# Patient Record
Sex: Male | Born: 1940
Health system: Southern US, Community
[De-identification: ages and names within clinical notes are randomized; demographics above are authoritative.]

## PROBLEM LIST (undated history)

## (undated) DIAGNOSIS — R569 Unspecified convulsions: Secondary | ICD-10-CM

## (undated) DIAGNOSIS — F1010 Alcohol abuse, uncomplicated: Secondary | ICD-10-CM

## (undated) DIAGNOSIS — E785 Hyperlipidemia, unspecified: Secondary | ICD-10-CM

## (undated) DIAGNOSIS — F0390 Unspecified dementia without behavioral disturbance: Secondary | ICD-10-CM

## (undated) DIAGNOSIS — K219 Gastro-esophageal reflux disease without esophagitis: Secondary | ICD-10-CM

## (undated) DIAGNOSIS — I639 Cerebral infarction, unspecified: Secondary | ICD-10-CM

## (undated) DIAGNOSIS — I10 Essential (primary) hypertension: Secondary | ICD-10-CM

## (undated) HISTORY — PX: OTHER SURGICAL HISTORY: SHX169

---

## 2005-05-19 ENCOUNTER — Inpatient Hospital Stay: Payer: Self-pay | Admitting: Psychiatry

## 2005-05-24 ENCOUNTER — Ambulatory Visit: Payer: Self-pay | Admitting: Unknown Physician Specialty

## 2005-05-27 ENCOUNTER — Ambulatory Visit: Payer: Self-pay | Admitting: Unknown Physician Specialty

## 2005-06-25 ENCOUNTER — Ambulatory Visit: Payer: Self-pay | Admitting: Unknown Physician Specialty

## 2006-12-12 ENCOUNTER — Ambulatory Visit: Payer: Self-pay | Admitting: Family Medicine

## 2006-12-14 ENCOUNTER — Ambulatory Visit: Payer: Self-pay

## 2007-05-21 ENCOUNTER — Emergency Department: Payer: Self-pay | Admitting: Emergency Medicine

## 2007-05-21 ENCOUNTER — Other Ambulatory Visit: Payer: Self-pay

## 2007-12-07 ENCOUNTER — Other Ambulatory Visit: Payer: Self-pay

## 2007-12-07 ENCOUNTER — Inpatient Hospital Stay: Payer: Self-pay | Admitting: Internal Medicine

## 2009-07-11 ENCOUNTER — Emergency Department: Payer: Self-pay

## 2009-07-13 ENCOUNTER — Inpatient Hospital Stay: Payer: Self-pay | Admitting: Internal Medicine

## 2013-09-28 ENCOUNTER — Emergency Department: Payer: Self-pay | Admitting: Emergency Medicine

## 2013-09-28 LAB — COMPREHENSIVE METABOLIC PANEL
Albumin: 3.5 g/dL (ref 3.4–5.0)
BUN: 16 mg/dL (ref 7–18)
Bilirubin,Total: 0.6 mg/dL (ref 0.2–1.0)
Calcium, Total: 9 mg/dL (ref 8.5–10.1)
Chloride: 101 mmol/L (ref 98–107)
Co2: 30 mmol/L (ref 21–32)
EGFR (African American): >60
EGFR (Non-African Amer.): >60
Glucose: 104 mg/dL — ABNORMAL HIGH (ref 65–99)
Osmolality: 270 (ref 275–301)
SGOT(AST): 50 U/L — ABNORMAL HIGH (ref 15–37)
Sodium: 134 mmol/L — ABNORMAL LOW (ref 136–145)
Total Protein: 7.2 g/dL (ref 6.4–8.2)

## 2013-09-28 LAB — CBC
HCT: 36.4 % — ABNORMAL LOW (ref 40.0–52.0)
HGB: 12.9 g/dL — ABNORMAL LOW (ref 13.0–18.0)
MCH: 37.7 pg — ABNORMAL HIGH (ref 26.0–34.0)
Platelet: 182 10*3/uL (ref 150–440)
RDW: 17.5 % — ABNORMAL HIGH (ref 11.5–14.5)
WBC: 7.4 10*3/uL (ref 3.8–10.6)

## 2013-09-28 LAB — URINALYSIS, COMPLETE
Bilirubin,UR: NEGATIVE
Glucose,UR: NEGATIVE mg/dL (ref 0–75)
Protein: NEGATIVE
RBC,UR: 7 /HPF (ref 0–5)
Squamous Epithelial: 2
WBC UR: 21 /HPF (ref 0–5)

## 2013-10-09 ENCOUNTER — Ambulatory Visit: Payer: Self-pay

## 2014-06-06 ENCOUNTER — Inpatient Hospital Stay: Payer: Self-pay

## 2014-06-06 LAB — ETHANOL
Ethanol %: <0.003 % (ref 0.000–0.080)
Ethanol: <3 mg/dL

## 2014-06-06 LAB — CBC
HCT: 43 % (ref 40.0–52.0)
HGB: 14.5 g/dL (ref 13.0–18.0)
MCH: 35.6 pg — ABNORMAL HIGH (ref 26.0–34.0)
MCHC: 33.6 g/dL (ref 32.0–36.0)
MCV: 106 fL — ABNORMAL HIGH (ref 80–100)
PLATELETS: 61 10*3/uL — AB (ref 150–440)
RBC: 4.06 10*6/uL — AB (ref 4.40–5.90)
RDW: 17.4 % — AB (ref 11.5–14.5)
WBC: 12.8 10*3/uL — AB (ref 3.8–10.6)

## 2014-06-06 LAB — BASIC METABOLIC PANEL
ANION GAP: 11 (ref 7–16)
BUN: 15 mg/dL (ref 7–18)
Calcium, Total: 8.6 mg/dL (ref 8.5–10.1)
Chloride: 94 mmol/L — ABNORMAL LOW (ref 98–107)
Co2: 31 mmol/L (ref 21–32)
Creatinine: 1.17 mg/dL (ref 0.60–1.30)
EGFR (Non-African Amer.): >60
Glucose: 135 mg/dL — ABNORMAL HIGH (ref 65–99)
Osmolality: 275 (ref 275–301)
Potassium: 2.3 mmol/L — CL (ref 3.5–5.1)
Sodium: 136 mmol/L (ref 136–145)

## 2014-06-06 LAB — HEPATIC FUNCTION PANEL A (ARMC)
ALBUMIN: 3.4 g/dL (ref 3.4–5.0)
AST: 229 U/L — AB (ref 15–37)
Alkaline Phosphatase: 98 U/L
BILIRUBIN TOTAL: 1.3 mg/dL — AB (ref 0.2–1.0)
Bilirubin, Direct: 0.6 mg/dL — ABNORMAL HIGH (ref 0.00–0.20)
SGPT (ALT): 84 U/L — ABNORMAL HIGH (ref 12–78)
Total Protein: 7.3 g/dL (ref 6.4–8.2)

## 2014-06-06 LAB — MAGNESIUM: Magnesium: 0.5 mg/dL — ABNORMAL LOW

## 2014-06-07 LAB — CBC WITH DIFFERENTIAL/PLATELET
BASOS PCT: 0.3 %
Basophil #: 0 10*3/uL (ref 0.0–0.1)
EOS ABS: 0 10*3/uL (ref 0.0–0.7)
Eosinophil %: 0.2 %
HCT: 39.4 % — ABNORMAL LOW (ref 40.0–52.0)
HGB: 13.3 g/dL (ref 13.0–18.0)
LYMPHS ABS: 0.8 10*3/uL — AB (ref 1.0–3.6)
Lymphocyte %: 7.9 %
MCH: 36.3 pg — ABNORMAL HIGH (ref 26.0–34.0)
MCHC: 33.7 g/dL (ref 32.0–36.0)
MCV: 108 fL — AB (ref 80–100)
Monocyte #: 0.8 x10 3/mm (ref 0.2–1.0)
Monocyte %: 7.8 %
Neutrophil #: 8.6 10*3/uL — ABNORMAL HIGH (ref 1.4–6.5)
Neutrophil %: 83.8 %
PLATELETS: 61 10*3/uL — AB (ref 150–440)
RBC: 3.66 10*6/uL — AB (ref 4.40–5.90)
RDW: 18.5 % — AB (ref 11.5–14.5)
WBC: 10.3 10*3/uL (ref 3.8–10.6)

## 2014-06-07 LAB — BASIC METABOLIC PANEL
Anion Gap: 6 — ABNORMAL LOW (ref 7–16)
BUN: 13 mg/dL (ref 7–18)
CALCIUM: 8.3 mg/dL — AB (ref 8.5–10.1)
Chloride: 97 mmol/L — ABNORMAL LOW (ref 98–107)
Co2: 34 mmol/L — ABNORMAL HIGH (ref 21–32)
Creatinine: 1.03 mg/dL (ref 0.60–1.30)
EGFR (Non-African Amer.): >60
GLUCOSE: 99 mg/dL (ref 65–99)
Osmolality: 274 (ref 275–301)
Potassium: 3.4 mmol/L — ABNORMAL LOW (ref 3.5–5.1)
Sodium: 137 mmol/L (ref 136–145)

## 2014-06-07 LAB — DRUG SCREEN, URINE
Amphetamines, Ur Screen: NEGATIVE (ref ?–1000)
BARBITURATES, UR SCREEN: NEGATIVE (ref ?–200)
Benzodiazepine, Ur Scrn: NEGATIVE (ref ?–200)
Cannabinoid 50 Ng, Ur ~~LOC~~: NEGATIVE (ref ?–50)
Cocaine Metabolite,Ur ~~LOC~~: NEGATIVE (ref ?–300)
MDMA (ECSTASY) UR SCREEN: NEGATIVE (ref ?–500)
Methadone, Ur Screen: NEGATIVE (ref ?–300)
Opiate, Ur Screen: NEGATIVE (ref ?–300)
PHENCYCLIDINE (PCP) UR S: NEGATIVE (ref ?–25)
TRICYCLIC, UR SCREEN: NEGATIVE (ref ?–1000)

## 2014-06-07 LAB — URINALYSIS, COMPLETE
Bilirubin,UR: NEGATIVE
Glucose,UR: NEGATIVE mg/dL (ref 0–75)
NITRITE: NEGATIVE
Ph: 6 (ref 4.5–8.0)
Protein: 30
RBC,UR: 12 /HPF (ref 0–5)
SPECIFIC GRAVITY: 1.018 (ref 1.003–1.030)
Squamous Epithelial: 2
WBC UR: 33 /HPF (ref 0–5)

## 2014-06-07 LAB — MAGNESIUM: Magnesium: 2.1 mg/dL

## 2014-06-07 LAB — CLOSTRIDIUM DIFFICILE(ARMC)

## 2014-06-09 LAB — COMPREHENSIVE METABOLIC PANEL
ALT: 70 U/L (ref 12–78)
Albumin: 3.1 g/dL — ABNORMAL LOW (ref 3.4–5.0)
Alkaline Phosphatase: 89 U/L
Anion Gap: 11 (ref 7–16)
BILIRUBIN TOTAL: 1.5 mg/dL — AB (ref 0.2–1.0)
BUN: 11 mg/dL (ref 7–18)
Calcium, Total: 8.1 mg/dL — ABNORMAL LOW (ref 8.5–10.1)
Chloride: 99 mmol/L (ref 98–107)
Co2: 24 mmol/L (ref 21–32)
Creatinine: 0.83 mg/dL (ref 0.60–1.30)
EGFR (African American): >60
EGFR (Non-African Amer.): >60
Glucose: 78 mg/dL (ref 65–99)
Osmolality: 267 (ref 275–301)
Potassium: 3.9 mmol/L (ref 3.5–5.1)
SGOT(AST): 179 U/L — ABNORMAL HIGH (ref 15–37)
Sodium: 134 mmol/L — ABNORMAL LOW (ref 136–145)
Total Protein: 7 g/dL (ref 6.4–8.2)

## 2014-06-10 LAB — CBC WITH DIFFERENTIAL/PLATELET
Eosinophil: 1 %
HCT: 35.8 % — AB (ref 40.0–52.0)
HGB: 11.9 g/dL — AB (ref 13.0–18.0)
Lymphocytes: 11 %
MCH: 36.2 pg — AB (ref 26.0–34.0)
MCHC: 33.2 g/dL (ref 32.0–36.0)
MCV: 109 fL — ABNORMAL HIGH (ref 80–100)
Metamyelocyte: 1 %
Monocytes: 11 %
PLATELETS: 101 10*3/uL — AB (ref 150–440)
RBC: 3.28 10*6/uL — AB (ref 4.40–5.90)
RDW: 18.2 % — AB (ref 11.5–14.5)
SEGMENTED NEUTROPHILS: 76 %
WBC: 8 10*3/uL (ref 3.8–10.6)

## 2014-06-12 LAB — URINALYSIS, COMPLETE
Bacteria: NONE SEEN
Bilirubin,UR: NEGATIVE
Glucose,UR: NEGATIVE mg/dL (ref 0–75)
Ketone: NEGATIVE
Leukocyte Esterase: NEGATIVE
NITRITE: NEGATIVE
Ph: 8 (ref 4.5–8.0)
Protein: NEGATIVE
RBC,UR: 1 /HPF (ref 0–5)
Specific Gravity: 1.005 (ref 1.003–1.030)
Squamous Epithelial: 1
WBC UR: 1 /HPF (ref 0–5)

## 2014-06-13 LAB — URINE CULTURE

## 2014-10-25 ENCOUNTER — Emergency Department: Payer: Self-pay | Admitting: Emergency Medicine

## 2014-10-25 LAB — COMPREHENSIVE METABOLIC PANEL
ANION GAP: 11 (ref 7–16)
Albumin: 3.7 g/dL (ref 3.4–5.0)
Alkaline Phosphatase: 92 U/L
BILIRUBIN TOTAL: 1.5 mg/dL — AB (ref 0.2–1.0)
BUN: 18 mg/dL (ref 7–18)
CALCIUM: 8.9 mg/dL (ref 8.5–10.1)
Chloride: 92 mmol/L — ABNORMAL LOW (ref 98–107)
Co2: 30 mmol/L (ref 21–32)
Creatinine: 1.02 mg/dL (ref 0.60–1.30)
Glucose: 125 mg/dL — ABNORMAL HIGH (ref 65–99)
Osmolality: 270 (ref 275–301)
POTASSIUM: 3.3 mmol/L — AB (ref 3.5–5.1)
SGOT(AST): 167 U/L — ABNORMAL HIGH (ref 15–37)
SGPT (ALT): 67 U/L — ABNORMAL HIGH
SODIUM: 133 mmol/L — AB (ref 136–145)
Total Protein: 7.7 g/dL (ref 6.4–8.2)

## 2014-10-25 LAB — CBC WITH DIFFERENTIAL/PLATELET
BASOS ABS: 0 10*3/uL (ref 0.0–0.1)
Basophil %: 0.6 %
EOS ABS: 0 10*3/uL (ref 0.0–0.7)
EOS PCT: 0.9 %
HCT: 38.6 % — ABNORMAL LOW (ref 40.0–52.0)
HGB: 12.9 g/dL — AB (ref 13.0–18.0)
Lymphocyte #: 0.8 10*3/uL — ABNORMAL LOW (ref 1.0–3.6)
Lymphocyte %: 16.2 %
MCH: 36 pg — AB (ref 26.0–34.0)
MCHC: 33.3 g/dL (ref 32.0–36.0)
MCV: 108 fL — ABNORMAL HIGH (ref 80–100)
Monocyte #: 0.9 x10 3/mm (ref 0.2–1.0)
Monocyte %: 18.7 %
NEUTROS ABS: 3 10*3/uL (ref 1.4–6.5)
Neutrophil %: 63.6 %
PLATELETS: 93 10*3/uL — AB (ref 150–440)
RBC: 3.58 10*6/uL — ABNORMAL LOW (ref 4.40–5.90)
RDW: 19.6 % — ABNORMAL HIGH (ref 11.5–14.5)
WBC: 4.7 10*3/uL (ref 3.8–10.6)

## 2014-10-25 LAB — TROPONIN I
TROPONIN-I: 0.02 ng/mL
Troponin-I: 0.03 ng/mL

## 2014-10-25 LAB — LIPASE, BLOOD: LIPASE: 258 U/L (ref 73–393)

## 2014-10-25 LAB — ETHANOL: Ethanol: <3 mg/dL

## 2014-12-06 DIAGNOSIS — M545 Low back pain: Secondary | ICD-10-CM | POA: Diagnosis not present

## 2014-12-06 DIAGNOSIS — N390 Urinary tract infection, site not specified: Secondary | ICD-10-CM | POA: Diagnosis not present

## 2014-12-06 DIAGNOSIS — I10 Essential (primary) hypertension: Secondary | ICD-10-CM | POA: Diagnosis not present

## 2014-12-06 DIAGNOSIS — S21201D Unspecified open wound of right back wall of thorax without penetration into thoracic cavity, subsequent encounter: Secondary | ICD-10-CM | POA: Diagnosis not present

## 2015-01-31 DIAGNOSIS — Z0000 Encounter for general adult medical examination without abnormal findings: Secondary | ICD-10-CM | POA: Diagnosis not present

## 2015-01-31 DIAGNOSIS — Z862 Personal history of diseases of the blood and blood-forming organs and certain disorders involving the immune mechanism: Secondary | ICD-10-CM | POA: Diagnosis not present

## 2015-03-18 NOTE — Consult Note (Signed)
Brief Consult Note: Diagnosis: Hematemesis.   Patient was seen by consultant.   Comments: [name] is a pleasant 74 y/o caucasian male admitted with coffee ground emesis.  He has chronic daily alcohol use & is taking meloxicam, BC powders & Alka Seltzers daily.  No known hx of cirrhosis but he has been a lifelong alcoholic.  EGD with Dr Servando SnareWohl today to look for erosive esophagitis, PUD, gastritis or varices as culprit of his hematemesis.  Discussed risks/benefits of procedure which include but are not limited to bleeding, infection, perforation & drug reaction.  Patient agrees with this plan & consent will be obtained.  Plan: 1) NPO 2) EGD today with Dr Servando SnareWOHL 3) Agree with Protonix 40mg  BID 4) Pt advised to AVOID NSAIDS, BC powders, Alka Seltzers 5) Urged ETOH cessation 6) Outpatient colonoscopy at a later date Thanks for allowing us to participate in his care.  Please see full dictated note. #[id number].  Electronic Signatures: Joselyn ArrowJones, Ayush Boulet L (NP)  (Signed 14-Jul-15 11:26)  Authored: Brief Consult Note   Last Updated: 14-Jul-15 11:26 by Joselyn ArrowJones, Ingri Diemer L (NP)

## 2015-03-18 NOTE — Discharge Summary (Signed)
PATIENT NAME:  Richard Carter, [name] MR#:  [id number] DATE OF BIRTH:  1941/05/27  DATE OF ADMISSION:  06/06/2014 DATE OF DISCHARGE:  06/13/2014  CONSULTING GASTROENTEROLOGIST: Dr. Midge Miniumarren Wohl.   DISCHARGE DIAGNOSES: 1.  Upper gastrointestinal bleed due to duodenitis/gastritis.  2.  Alcoholic liver disease.  3.  Acute alcohol withdrawal.  4.  Thrombocytopenia.  5.  Atrial fibrillation, new onset.  6.  Hypokalemia.  7.  Hypomagnesemia.  ADMISSION HISTORY AND PHYSICAL: Please see details from initial history and physical. Briefly, this is a 74 year old gentleman with known history of long-standing alcohol abuse, admitted July 13th with nausea, vomiting and diarrhea. He had been drinking heavily for several weeks. He has coffee-ground emesis. The patient was noted on admission to have a very low potassium of 2.3 and magnesium of 0.7. The patient was also noted to be in new-onset atrial fibrillation.   HOSPITAL COURSE:  1.  Upper GI bleed. The patient was seen by Dr. Midge Miniumarren Wohl. He had an upper endoscopy July 14th which revealed gastritis, reflux esophagitis and duodenitis. He was treated with a proton pump inhibitor. He had no further hematemesis or melena. His hemoglobin which had been 14.5 on admission was 11.9 on July 17th.  2.  Alcoholic liver disease. He has a long history of alcohol abuse. He has known thrombocytopenia, platelets around 100. He did have an increased AST of 229 and an ALT of 84 on admission. By July 16th these were down to 179 and 70. Total bilirubin was 1.5. The patient was discontinued on the CIWA protocol. On the day of discharge, he was not requiring any other medications. [facility]  3.  Electrolyte imbalances. This was treated with repletion and stabilized.  4.  New onset atrial fibrillation. He converted to a sinus rhythm. It was likely due to his profound electrolyte imbalances. No further anticoagulation or treatment as needed.    DISCHARGE MEDICATIONS: [facility].   DISCHARGE FOLLOWUP: The patient will follow up with Dr. Sampson GoonFitzgerald in 1 to 2 weeks. He will also need followup with GI for outpatient colonoscopy.   DISCHARGE DIET: Regular. The patient is to avoid alcohol. The patient is to avoid NSAIDs.  TIME SPENT: 35 minutes.   ____________________________ Stann Mainlandavid P. Sampson GoonFitzgerald, MD dpf:sb D: 06/13/2014 08:57:41 ET T: 06/13/2014 09:36:59 ET JOB#: [id number]  cc: Stann Mainlandavid P. Sampson GoonFitzgerald, MD, <Dictator> DAVID Sampson GoonFITZGERALD MD ELECTRONICALLY SIGNED 06/13/2014 21:25

## 2015-03-18 NOTE — Consult Note (Signed)
PATIENT NAME:  Richard Carter, Richard Carter MR#:  [id number] DATE OF BIRTH:  February 03, 1941  DATE OF CONSULTATION:  06/07/2014  REFERRING PHYSICIAN:  Dr. Heron Nay.  CONSULTING PHYSICIAN:  Joselyn Arrow, NP and Midge Minium, M.D.  PRIMARY CARE PHYSICIAN:  Dr. Glenetta Hew.     REASON FOR CONSULTATION: Coffee ground emesis.   HISTORY OF PRESENT ILLNESS: [name] is a 74 year old Caucasian male with history of daily alcohol abuse which has been lifelong. He reports he was in his usual state of health until yesterday early morning when he began to have nausea, vomiting and diarrhea and felt like he could not keep anything down. He describes the vomiting as coffee ground emesis several times yesterday morning. He is taking meloxicam, BC powders for arthritis once a day  and Alka-Seltzer once every day or every other day. He has drank about a quart of whiskey since the age of 22, and also drinks an occasional beer. He denies abdominal pain. He also has a history of chronic GERD and is taking omeprazole 20 mg daily. He did have loose bowels at 3:30 this morning, but denies any rectal bleeding or melena. An EGD by Dr. Mechele Collin, 07/14/2009, which showed LA grade C esophagitis, nonbleeding esophageal ulcers and a hiatal hernia. His initial hemoglobin was 14.5, down to 3.3 today with a platelet count of 61. C. diff negative. Urinalysis shows hematuria and urinary tract infection. His total bilirubin was 1.3, direct 0.6, AST 229, ALT 84, otherwise normal LFTs. White blood cell count was elevated at 12.8 upon admission, down to 10.3 today.   PAST MEDICAL HISTORY: History of chronic GERD, osteoarthritis, BHP, hypertension, alcohol abuse, tobacco abuse, possible history of hepatitis C, although the patient does not have details; erosive esophagitis as described in HIP, hiatal hernia, degenerative disk disease.   MEDICATIONS PRIOR TO ADMISSION: Atorvastatin 10 mg at bedtime, B12 once daily, hydrochlorothiazide 12.5 mg daily,  ibuprofen 200 mg q.6 hours p.r.n., meloxicam 7.5 mg daily, metoprolol tartrate 250 mg daily, multivitamin daily, omeprazole 20 mg daily, potassium chloride 20 mEq b.i.d., tamsulosin 0.4 mg daily, thiamine 100 mg daily, trazodone 50 mg at bedtime, BC powders p.r.n.   ALLERGIES: No known drug allergies.   FAMILY HISTORY: Positive for colon cancer in a brother in his 30s. Family history of alcoholism and cirrhosis.   SOCIAL HISTORY: He has been a chronic alcoholic since the age of 67. He lives alone. His wife is sick and in a nursing home. He has 2 grown healthy children. He denies any illicit drug use. He is a retired [employer].   REVIEW OF SYSTEMS:  See HPI. He has chronic bilateral hip pain. Otherwise, negative twelve-point review of systems.     PHYSICAL EXAMINATION: VITAL SIGNS: Weight 141.1 pounds, height 65 inches. Temp 98.7, pulse 76, respirations 20, blood pressure 158/98, O2 sat 91% on 2 L/min.  GENERAL:  He is a disheveled, thin Caucasian male who is alert, pleasant and cooperative, in no acute distress. He is accompanied by his neighbor and his sister.  HEENT: Sclerae clear, nonicteric. Conjunctivae pink. Oropharynx pink and moist. He has  poor dentition.  NECK: Supple without mass or thyromegaly.  CHEST: Heart rate is irregularly irregular without murmurs, clicks, rubs or gallops.  LUNGS: Clear to auscultation bilaterally.  ABDOMEN: Positive bowel sounds x 4. No bruits auscultated. Abdomen is soft, nontender, nondistended, without palpable mass or hepatosplenomegaly. No rebound tenderness or guarding.  EXTREMITIES: Without edema. He does have clubbing.  SKIN: Pink, warm and dry  without any rash or jaundice.  NEUROLOGIC: Grossly intact.  MUSCULOSKELETAL: Good equal movement and strength bilaterally.  PSYCHIATRIC: Alert, cooperative, normal mood and affect.   LABORATORY STUDIES: Potassium 3.4, chloride 97, CO2 of 34, anion gap 6, calcium 8.3, otherwise normal basic metabolic panel.  Serum alcohol was negative. Urine drug screen was negative. Chest x-ray showed a hiatal hernia.   IMPRESSION:  [name] is a pleasant 74 year old Caucasian male admitted with coffee ground emesis. He has chronic daily alcohol use and taking meloxicam, BC powders and Alka-Seltzer daily. No known history of cirrhosis, but he has been a lifelong alcoholic and has thrombocytopenia. EGD with Dr. Servando SnareWohl today to look for erosive esophagitis, peptic ulcer disease, gastritis or varices as culprit of his hematemesis. I have discussed the risks and benefits of this procedure which include, but are not limited to bleeding, infection, perforation, drug reaction. He agrees with this plan and consent will be obtained.   PLAN: 1.  NPO.  2.  EGD with Dr. Servando SnareWohl today.  3.  Agree with Protonix 40 mg b.i.d.  4.  The patient advised to avoid NSAIDs, BC powders, Alka-Seltzer. 5.  Urged alcohol cessation.   Thank you for allowing us to participate in his care.    ____________________________ Joselyn ArrowKandice L. Billijo Dilling, NP klj:dmm D: 06/07/2014 11:26:04 ET T: 06/07/2014 12:01:27 ET JOB#: [id number]  cc: Joselyn ArrowKandice L. Lamarcus Spira, NP, <Dictator> Maurilio LovelyNoelle McLaurin, MD Joselyn ArrowKANDICE L Kashtyn Jankowski FNP ELECTRONICALLY SIGNED 06/12/2014 21:28

## 2015-03-18 NOTE — H&P (Signed)
PATIENT NAME:  Richard Carter, Richard Carter MR#:  [id number] DATE OF BIRTH:  1941/03/10  DATE OF ADMISSION:  06/06/2014  PRIMARY CARE PHYSICIAN: Dr. Sampson Goon   REFERRING PHYSICIAN: Dr. Glenetta Hew   CHIEF COMPLAINT: Nausea, vomiting and diarrhea.   HISTORY OF PRESENT ILLNESS: The patient is a 74 year old male with a history of heavy alcohol abuse. Has been drinking heavily for the last 2 to 3 weeks. Has been having nausea, vomiting, multiple episodes, coffee-ground in nature. Could not tell if the patient has been having any black stools. Concerning this, the patient's of persistent nausea and vomiting, the patient's sisters brought him to the Emergency Department. The patient was also noted to have new onset atrial fibrillation. The patient has electrolyte imbalances, with a potassium of 2.3 and a magnesium of 0.7. The patient is currently receiving IV magnesium. The patient also has been falling down frequently.   PAST MEDICAL HISTORY: 1.  Gastroesophageal reflux disease.  2.  Osteoarthritis.  3.  Benign prosthetic hypertrophy.  4.  Hypertension.  5.  Heavy alcohol abuse.  6.  Continued tobacco use.    PAST SURGICAL HISTORY: None.   ALLERGIES: No known drug allergies.   HOME MEDICATIONS: 1.  Trazodone 50 mg once a day.  2.  Thiamine 100 mg once a day.  3.  Tamsulosin 0.4 mg once a day.  4.  Potassium chloride 20 mEq 2 times a day.  5.  Omeprazole 20 mg daily.  6.  Multivitamin once a day.  7.  Metoprolol 50 mg once a day.  8.  Meloxicam 7.5 mg once a day.  9.  Ibuprofen 200 mg every 6 hours.  10.  Hydrochlorothiazide 12.5 mg once a day. 11.  Atorvastatin 10 mg once a day.    SOCIAL HISTORY: Continues to smoke 1 pack a day. In the past, smoked up to 3 packs a day. Drinks alcohol heavily. Denies using any illicit drugs. Lives by himself.   FAMILY HISTORY: Hypertension.   REVIEW OF SYSTEMS: CONSTITUTIONAL: Experiences generalized weakness.  EYES: No change in vision.  ENT: No change in  hearing.  RESPIRATORY: Has cough and shortness of breath.  CARDIOVASCULAR: No chest pain, palpations.  GASTROINTESTINAL: Has nausea, vomiting and diarrhea.  GENITOURINARY: No dysuria or hematuria.  HEMATOLOGIC: No easy bruising or bleeding.  SKIN: No rash or lesions.  MUSCULOSKELETAL: No joint pains and aches.  NEUROLOGIC: No weakness or numbness in any part of the body.   PHYSICAL EXAMINATION: GENERAL: This is a well-built, well-nourished, age-appropriate male, lying down in the bed, not in distress.  VITAL SIGNS: Temperature 98.5, pulse 100, blood pressure 124/77, respiratory rate of 15, oxygen saturation 100% on 2 liters of oxygen.  HEENT: Head normocephalic, atraumatic. There is no scleral icterus. Conjunctivae normal. Pupils equal and react to light. Extraocular movements are intact. Mucous membranes dry. No pharyngeal erythema.  NECK: Supple. No lymphadenopathy. No JVD. No carotid bruit.  CHEST: Has no focal tenderness.  LUNGS: Bilaterally clear to auscultation.  HEART: S1, S2 regular. No murmurs are heard.  ABDOMEN: Bowel sounds present. Soft. Has tenderness in the epigastric area and left upper quadrant. No rebound or guarding.  EXTREMITIES: No pedal edema. Pulses 2+.  SKIN: No rash or lesions.  MUSCULOSKELETAL: Good range of motion in all the extremities.  NEUROLOGIC: The patient is alert, oriented to place, person, and time. Cranial nerves II through XII intact. Motor 5/5 in upper and lower extremities.   LABORATORY DATA: CBC: WBC of 12.8, hemoglobin 14.5, platelet count  of 61. Mildly elevated LFTs. Magnesium 0.5, potassium 2.3.   ASSESSMENT AND PLAN: The patient is a 74 year old male with a history of heavy alcohol abuse, comes with nausea, vomiting, new onset atrial fibrillation, hypokalemia, and hypomagnesemia.  1.  New onset atrial fibrillation, heart rate is ranging around 90 to 100. Keep the patient on 25 mg of metoprolol every 6 hours and follow up.  2.  Hypokalemia. Will  replace by intravenous and by mouth.  3.  Hypomagnesemia. Replace by intravenous. 4.  Atrial fibrillation. Currently, rate is somewhat controlled. The patient meets CHADS score of 2. This is secondary to electrolyte imbalances from the alcohol use. Continue with intravenous fluids and follow up.  5.  Keep the patient on deep vein thrombosis prophylaxis with sequential compression devices. 6.  Nausea, vomiting with gastrointestinal bleed. Keep the patient on nothing by mouth, continue with intravenous fluids and antinausea medications. Continue to follow up with CBC.    ____________________________ Susa GriffinsPadmaja Eisley Barber, MD pv:cg D: 06/06/2014 23:45:07 ET T: 06/07/2014 00:17:16 ET JOB#: [id number]  cc: Susa GriffinsPadmaja Krishna Dancel, MD, <Dictator> Stann Mainlandavid P. Sampson GoonFitzgerald, MD Clerance LavPADMAJA Ellwood Steidle MD ELECTRONICALLY SIGNED 06/08/2014 0:07

## 2015-03-18 NOTE — Consult Note (Signed)
Chief Complaint:  Subjective/Chief Complaint Pt denies nausea, vomiting, abdominal pain or melena. DTs last night.   VITAL SIGNS/ANCILLARY NOTES: **Vital Signs.:   16-Jul-15 11:04  Vital Signs Type Routine  Temperature Temperature (F) 98.5  Celsius 36.9  Pulse Pulse 77  Respirations Respirations 20  Systolic BP Systolic BP 828  Diastolic BP (mmHg) Diastolic BP (mmHg) 003  Mean BP 136  Pulse Ox % Pulse Ox % 90  Pulse Ox Activity Level  At rest  Oxygen Delivery Room Air/ 21 %   Brief Assessment:  GEN well developed, no acute distress, A/Ox3   Cardiac Regular   Respiratory normal resp effort   Gastrointestinal Normal   Gastrointestinal details normal Soft  Nondistended  No masses palpable  Bowel sounds normal  No rebound tenderness  No gaurding  No rigidity   EXTR negative cyanosis/clubbing   Additional Physical Exam Skin: pale, warm, dry   Lab Results: Hepatic:  16-Jul-15 04:31   Bilirubin, Total  1.5  Alkaline Phosphatase 89 (45-117 NOTE: New Reference Range 10/15/13)  SGPT (ALT) 70  SGOT (AST)  179  Total Protein, Serum 7.0  Albumin, Serum  3.1  Routine Chem:  16-Jul-15 04:31   Glucose, Serum 78  BUN 11  Creatinine (comp) 0.83  Sodium, Serum  134  Potassium, Serum 3.9  Chloride, Serum 99  CO2, Serum 24  Calcium (Total), Serum  8.1  Osmolality (calc) 267  eGFR (African American) >60  eGFR (Non-African American) >60 (eGFR values <60m/min/1.73 m2 may be an indication of chronic kidney disease (CKD). Calculated eGFR is useful in patients with stable renal function. The eGFR calculation will not be reliable in acutely ill patients when serum creatinine is changing rapidly. It is not useful in  patients on dialysis. The eGFR calculation may not be applicable to patients at the low and high extremes of body sizes, pregnant women, and vegetarians.)  Anion Gap 11   Assessment/Plan:  Assessment/Plan:  Assessment LA Grade C erosive  esophagitis/gastritis/duodenitis:  On BID PPI ETOH Abuse: contemplation phase. No with DTs managed by hospitalist FH colon cancer: due for colonoscopy   Plan 1) Protonix 457mBID 2) No NSAIDS, BC powders, Alka Seltzers 3) ETOH cessation 4) Outpatient colonoscopy with Dr WoAllen Norrisill sign off, Please call if you have any questions or concerns   Electronic Signatures: JoAndria MeuseNP)  (Signed 16-Jul-15 14:30)  Authored: Chief Complaint, VITAL SIGNS/ANCILLARY NOTES, Brief Assessment, Lab Results, Assessment/Plan   Last Updated: 16-Jul-15 14:30 by JoAndria MeuseNP)

## 2015-03-18 NOTE — Consult Note (Signed)
Chief Complaint:  Subjective/Chief Complaint Pt denies nausea, vomiting, abdominal pain or melena.   VITAL SIGNS/ANCILLARY NOTES: **Vital Signs.:   15-Jul-15 04:50  Systolic BP Systolic BP 166    11:25  Vital Signs Type Routine  Temperature Temperature (F) 97.9  Celsius 36.6  Temperature Source oral  Pulse Pulse 67  Respirations Respirations 18  Systolic BP Systolic BP 170  Diastolic BP (mmHg) Diastolic BP (mmHg) 85  Mean BP 113  Pulse Ox % Pulse Ox % 95  Pulse Ox Activity Level  At rest  Oxygen Delivery 2L   Brief Assessment:  GEN well developed, no acute distress, A/Ox3   Cardiac Regular   Respiratory normal resp effort   Gastrointestinal Normal   Gastrointestinal details normal Soft  Nondistended  No masses palpable  Bowel sounds normal  No rebound tenderness  No gaurding  No rigidity   EXTR negative cyanosis/clubbing   Additional Physical Exam Skin: pale, warm, dry   Assessment/Plan:  Assessment/Plan:  Assessment LA Grade C erosive esophagitis/gastritis/duodenitis:  On BID PPI ETOH Abuse: contemplation phase FH colon cancer: due for colonoscopy   Plan 1) Protonix 40mg  BID 2) No NSAIDS, BC powders, Alka Seltzers 3) ETOH cessation 4) Outpatient colonoscopy with Dr Servando SnareWohl Please call if you have any questions or concerns   Electronic Signatures: Joselyn ArrowJones, Glyn Gerads L (NP)  (Signed 15-Jul-15 14:54)  Authored: Chief Complaint, VITAL SIGNS/ANCILLARY NOTES, Brief Assessment, Assessment/Plan   Last Updated: 15-Jul-15 14:54 by Joselyn ArrowJones, Morad Tal L (NP)

## 2015-06-02 DIAGNOSIS — M898X8 Other specified disorders of bone, other site: Secondary | ICD-10-CM | POA: Diagnosis not present

## 2015-06-02 DIAGNOSIS — F1020 Alcohol dependence, uncomplicated: Secondary | ICD-10-CM | POA: Diagnosis not present

## 2015-06-02 DIAGNOSIS — Z8679 Personal history of other diseases of the circulatory system: Secondary | ICD-10-CM | POA: Diagnosis not present

## 2015-06-02 DIAGNOSIS — M25551 Pain in right hip: Secondary | ICD-10-CM | POA: Diagnosis not present

## 2015-06-02 DIAGNOSIS — M1611 Unilateral primary osteoarthritis, right hip: Secondary | ICD-10-CM | POA: Diagnosis not present

## 2015-06-02 DIAGNOSIS — Z8639 Personal history of other endocrine, nutritional and metabolic disease: Secondary | ICD-10-CM | POA: Diagnosis not present

## 2015-06-02 DIAGNOSIS — G8929 Other chronic pain: Secondary | ICD-10-CM | POA: Diagnosis not present

## 2015-06-14 DIAGNOSIS — M5136 Other intervertebral disc degeneration, lumbar region: Secondary | ICD-10-CM | POA: Diagnosis not present

## 2015-06-14 DIAGNOSIS — M47816 Spondylosis without myelopathy or radiculopathy, lumbar region: Secondary | ICD-10-CM | POA: Diagnosis not present

## 2015-06-14 DIAGNOSIS — M461 Sacroiliitis, not elsewhere classified: Secondary | ICD-10-CM | POA: Diagnosis not present

## 2015-07-14 DIAGNOSIS — M461 Sacroiliitis, not elsewhere classified: Secondary | ICD-10-CM | POA: Diagnosis not present

## 2015-08-07 DIAGNOSIS — M461 Sacroiliitis, not elsewhere classified: Secondary | ICD-10-CM | POA: Diagnosis not present

## 2015-08-18 ENCOUNTER — Other Ambulatory Visit: Payer: Self-pay | Admitting: Orthopedic Surgery

## 2015-08-18 DIAGNOSIS — M545 Low back pain, unspecified: Secondary | ICD-10-CM

## 2015-08-28 ENCOUNTER — Ambulatory Visit
Admission: RE | Admit: 2015-08-28 | Discharge: 2015-08-28 | Disposition: A | Discharge status: Home / Self Care | Payer: Commercial Managed Care - HMO | Source: Ambulatory Visit | Attending: Orthopedic Surgery | Admitting: Orthopedic Surgery

## 2015-08-28 DIAGNOSIS — M4806 Spinal stenosis, lumbar region: Secondary | ICD-10-CM | POA: Diagnosis not present

## 2015-08-28 DIAGNOSIS — R29890 Loss of height: Secondary | ICD-10-CM | POA: Insufficient documentation

## 2015-08-28 DIAGNOSIS — S32010A Wedge compression fracture of first lumbar vertebra, initial encounter for closed fracture: Secondary | ICD-10-CM | POA: Diagnosis not present

## 2015-08-28 DIAGNOSIS — M545 Low back pain, unspecified: Secondary | ICD-10-CM

## 2015-08-28 DIAGNOSIS — M4856XA Collapsed vertebra, not elsewhere classified, lumbar region, initial encounter for fracture: Secondary | ICD-10-CM | POA: Insufficient documentation

## 2015-08-28 DIAGNOSIS — X58XXXA Exposure to other specified factors, initial encounter: Secondary | ICD-10-CM | POA: Insufficient documentation

## 2015-09-01 DIAGNOSIS — M47816 Spondylosis without myelopathy or radiculopathy, lumbar region: Secondary | ICD-10-CM | POA: Diagnosis not present

## 2015-09-01 DIAGNOSIS — M5136 Other intervertebral disc degeneration, lumbar region: Secondary | ICD-10-CM | POA: Diagnosis not present

## 2015-09-01 DIAGNOSIS — M545 Low back pain: Secondary | ICD-10-CM | POA: Diagnosis not present

## 2015-09-01 DIAGNOSIS — S32010A Wedge compression fracture of first lumbar vertebra, initial encounter for closed fracture: Secondary | ICD-10-CM | POA: Diagnosis not present

## 2015-09-01 DIAGNOSIS — M461 Sacroiliitis, not elsewhere classified: Secondary | ICD-10-CM | POA: Diagnosis not present

## 2015-09-28 DIAGNOSIS — Z8639 Personal history of other endocrine, nutritional and metabolic disease: Secondary | ICD-10-CM | POA: Diagnosis not present

## 2015-09-28 DIAGNOSIS — M1990 Unspecified osteoarthritis, unspecified site: Secondary | ICD-10-CM | POA: Diagnosis not present

## 2015-09-28 DIAGNOSIS — M8580 Other specified disorders of bone density and structure, unspecified site: Secondary | ICD-10-CM | POA: Diagnosis not present

## 2015-09-29 DIAGNOSIS — E559 Vitamin D deficiency, unspecified: Secondary | ICD-10-CM | POA: Insufficient documentation

## 2015-10-06 DIAGNOSIS — M8580 Other specified disorders of bone density and structure, unspecified site: Secondary | ICD-10-CM | POA: Diagnosis not present

## 2015-11-08 DIAGNOSIS — M810 Age-related osteoporosis without current pathological fracture: Secondary | ICD-10-CM | POA: Diagnosis not present

## 2015-11-08 DIAGNOSIS — E559 Vitamin D deficiency, unspecified: Secondary | ICD-10-CM | POA: Diagnosis not present

## 2015-11-15 DIAGNOSIS — M810 Age-related osteoporosis without current pathological fracture: Secondary | ICD-10-CM | POA: Diagnosis not present

## 2016-02-05 ENCOUNTER — Encounter: Payer: Self-pay | Admitting: *Deleted

## 2016-02-05 ENCOUNTER — Emergency Department
Admission: EM | Admit: 2016-02-05 | Discharge: 2016-02-06 | Disposition: A | Discharge status: Home / Self Care | Payer: Commercial Managed Care - HMO | Attending: Emergency Medicine | Admitting: Emergency Medicine

## 2016-02-05 DIAGNOSIS — I10 Essential (primary) hypertension: Secondary | ICD-10-CM | POA: Insufficient documentation

## 2016-02-05 DIAGNOSIS — F17200 Nicotine dependence, unspecified, uncomplicated: Secondary | ICD-10-CM | POA: Diagnosis not present

## 2016-02-05 DIAGNOSIS — Z87898 Personal history of other specified conditions: Secondary | ICD-10-CM | POA: Diagnosis not present

## 2016-02-05 DIAGNOSIS — F5104 Psychophysiologic insomnia: Secondary | ICD-10-CM | POA: Diagnosis not present

## 2016-02-05 DIAGNOSIS — M8580 Other specified disorders of bone density and structure, unspecified site: Secondary | ICD-10-CM | POA: Diagnosis not present

## 2016-02-05 DIAGNOSIS — K219 Gastro-esophageal reflux disease without esophagitis: Secondary | ICD-10-CM | POA: Diagnosis not present

## 2016-02-05 NOTE — ED Notes (Addendum)
Pt ambulatory to triage.  Pt saw doctor today and was started on blood pressure medicine today.  Pt has taken 2 pills and blood pressure remains elevated.  No headache.  No dizziness.   No chest pain.  Pt states he quit drinking 8 months ago.   Speech clear. Pt alert.

## 2016-02-05 NOTE — ED Provider Notes (Signed)
[facility] Emergency Department Provider Note  ____________________________________________  Time seen: 11:00 PM  I have reviewed the triage vital signs and the nursing notes.   HISTORY  Chief Complaint Hypertension     HPI Richard Carter is a 75 y.o. male with history of newly diagnosed hypertension by Dr. Burnadette PopLinthavong today in clinic presents with asymptomatic elevated blood pressure noted at home. Patient's caregiver stated that she noted the patient's blood pressure be elevated about 1:00 in the afternoon exceeding 200 systolic. Patient's brother states that on his arrival at 6 PM the patient's blood pressure was still elevated greater than 200 systolic. Patient was prescribed amlodipine 5 mg tablets today of which she took one. Patient then spoke with primary care provider who advised him to take a second tablet. Repeat blood pressure performed at 9 PM 188/104. Patient's blood pressure presentation to the emergency department 214/103. Patient denies any chest pain or shortness of breath no headache dizziness weakness numbness or gait instability.     Past medical history Alcoholism Hypertension There are no active problems to display for this patient.   Past surgical history None No current outpatient prescriptions on file.  Allergies No known drug allergies No family history on file.  Social History Social History  Substance Use Topics  . Smoking status: Current Every Day Smoker  . Smokeless tobacco: Not on file  . Alcohol Use: No    Review of Systems  Constitutional: Negative for fever. Eyes: Negative for visual changes. ENT: Negative for sore throat. Cardiovascular: Negative for chest pain. Respiratory: Negative for shortness of breath. Gastrointestinal: Negative for abdominal pain, vomiting and diarrhea. Genitourinary: Negative for dysuria. Musculoskeletal: Negative for back pain. Skin: Negative for rash. Neurological: Negative  for headaches, focal weakness or numbness.   10-point ROS otherwise negative.  ____________________________________________   PHYSICAL EXAM:  VITAL SIGNS: ED Triage Vitals  Enc Vitals Group     BP 02/05/16 2243 214/103 mmHg     Pulse Rate 02/05/16 2243 73     Resp 02/05/16 2243 20     Temp 02/05/16 2243 98.2 F (36.8 C)     Temp Source 02/05/16 2243 Oral     SpO2 --      Weight 02/05/16 2243 160 lb (72.576 kg)     Height 02/05/16 2243 5\' 7"  (1.702 m)     Head Cir --      Peak Flow --      Pain Score --      Pain Loc --      Pain Edu? --      Excl. in GC? --      Constitutional: Alert and oriented. Well appearing and in no distress. Eyes: Conjunctivae are normal. PERRL. Normal extraocular movements. ENT   Head: Normocephalic and atraumatic.   Nose: No congestion/rhinnorhea.   Mouth/Throat: Mucous membranes are moist.   Neck: No stridor. Hematological/Lymphatic/Immunilogical: No cervical lymphadenopathy. Cardiovascular: Normal rate, regular rhythm. Normal and symmetric distal pulses are present in all extremities. No murmurs, rubs, or gallops. Respiratory: Normal respiratory effort without tachypnea nor retractions. Breath sounds are clear and equal bilaterally. No wheezes/rales/rhonchi. Gastrointestinal: Soft and nontender. No distention. There is no CVA tenderness. Genitourinary: deferred Musculoskeletal: Nontender with normal range of motion in all extremities. No joint effusions.  No lower extremity tenderness nor edema. Neurologic:  Normal speech and language. No gross focal neurologic deficits are appreciated. Speech is normal.  Skin:  Skin is warm, dry and intact. No rash noted. Psychiatric: Mood  and affect are normal. Speech and behavior are normal. Patient exhibits appropriate insight and judgment.    EKG  ED ECG REPORT I, Hardy N Mcdaniel Ohms, the attending physician, personally viewed and interpreted this ECG.   Date: 02/06/2016  EKG Time:  11:31 PM  Rate: 67  Rhythm: Normal sinus rhythm  Axis: Normal  Intervals: Normal  ST&T Change: None       INITIAL IMPRESSION / ASSESSMENT AND PLAN / ED COURSE  Pertinent labs & imaging results that were available during my care of the patient were reviewed by me and considered in my medical decision making (see chart for details).  Patient's blood pressure improved to 178/92 without any emergency department intervention. Patient remains asymptomatic. I counseled the patient's family at length regarding the need to return to the emergency department if any chest pain shortness of breath weakness numbness gait instability or visual changes or any other emergency medical concerns were to occur  ____________________________________________   FINAL CLINICAL IMPRESSION(S) / ED DIAGNOSES  Final diagnoses:  Essential hypertension      Darci Current, MD 02/07/16 2317

## 2016-02-06 MED ORDER — VANCOMYCIN HCL IN DEXTROSE 1-5 GM/200ML-% IV SOLN
INTRAVENOUS | Status: AC
  Filled 2016-02-06: qty 200

## 2016-02-06 MED ORDER — LORAZEPAM 2 MG/ML IJ SOLN
INTRAMUSCULAR | Status: AC
  Filled 2016-02-06: qty 1

## 2016-02-06 NOTE — Discharge Instructions (Signed)
Hypertension Hypertension, commonly called high blood pressure, is when the force of blood pumping through your arteries is too strong. Your arteries are the blood vessels that carry blood from your heart throughout your body. A blood pressure reading consists of a higher number over a lower number, such as 110/72. The higher number (systolic) is the pressure inside your arteries when your heart pumps. The lower number (diastolic) is the pressure inside your arteries when your heart relaxes. Ideally you want your blood pressure below 120/80. Hypertension forces your heart to work harder to pump blood. Your arteries may become narrow or stiff. Having untreated or uncontrolled hypertension can cause heart attack, stroke, kidney disease, and other problems. RISK FACTORS Some risk factors for high blood pressure are controllable. Others are not.  Risk factors you cannot control include:   Race. You may be at higher risk if you are African American.  Age. Risk increases with age.  Gender. Men are at higher risk than women before age 45 years. After age 65, women are at higher risk than men. Risk factors you can control include:  Not getting enough exercise or physical activity.  Being overweight.  Getting too much fat, sugar, calories, or salt in your diet.  Drinking too much alcohol. SIGNS AND SYMPTOMS Hypertension does not usually cause signs or symptoms. Extremely high blood pressure (hypertensive crisis) may cause headache, anxiety, shortness of breath, and nosebleed. DIAGNOSIS To check if you have hypertension, your health care provider will measure your blood pressure while you are seated, with your arm held at the level of your heart. It should be measured at least twice using the same arm. Certain conditions can cause a difference in blood pressure between your right and left arms. A blood pressure reading that is higher than normal on one occasion does not mean that you need treatment. If  it is not clear whether you have high blood pressure, you may be asked to return on a different day to have your blood pressure checked again. Or, you may be asked to monitor your blood pressure at home for 1 or more weeks. TREATMENT Treating high blood pressure includes making lifestyle changes and possibly taking medicine. Living a healthy lifestyle can help lower high blood pressure. You may need to change some of your habits. Lifestyle changes may include:  Following the DASH diet. This diet is high in fruits, vegetables, and whole grains. It is low in salt, red meat, and added sugars.  Keep your sodium intake below 2,300 mg per day.  Getting at least 30-45 minutes of aerobic exercise at least 4 times per week.  Losing weight if necessary.  Not smoking.  Limiting alcoholic beverages.  Learning ways to reduce stress. Your health care provider may prescribe medicine if lifestyle changes are not enough to get your blood pressure under control, and if one of the following is true:  You are 18-59 years of age and your systolic blood pressure is above 140.  You are 60 years of age or older, and your systolic blood pressure is above 150.  Your diastolic blood pressure is above 90.  You have diabetes, and your systolic blood pressure is over 140 or your diastolic blood pressure is over 90.  You have kidney disease and your blood pressure is above 140/90.  You have heart disease and your blood pressure is above 140/90. Your personal target blood pressure may vary depending on your medical conditions, your age, and other factors. HOME CARE INSTRUCTIONS    Have your blood pressure rechecked as directed by your health care provider.   Take medicines only as directed by your health care provider. Follow the directions carefully. Blood pressure medicines must be taken as prescribed. The medicine does not work as well when you skip doses. Skipping doses also puts you at risk for  problems.  Do not smoke.   Monitor your blood pressure at home as directed by your health care provider. SEEK MEDICAL CARE IF:   You think you are having a reaction to medicines taken.  You have recurrent headaches or feel dizzy.  You have swelling in your ankles.  You have trouble with your vision. SEEK IMMEDIATE MEDICAL CARE IF:  You develop a severe headache or confusion.  You have unusual weakness, numbness, or feel faint.  You have severe chest or abdominal pain.  You vomit repeatedly.  You have trouble breathing. MAKE SURE YOU:   Understand these instructions.  Will watch your condition.  Will get help right away if you are not doing well or get worse.   This information is not intended to replace advice given to you by your health care provider. Make sure you discuss any questions you have with your health care provider.   Document Released: 11/11/2005 Document Revised: 03/28/2015 Document Reviewed: 09/03/2013 Elsevier Interactive Patient Education 2016 [employer]  

## 2016-02-06 NOTE — ED Notes (Signed)
Pt ambulated to the bedside commode to void. Pt returned to the bed without difficulty.

## 2016-02-09 DIAGNOSIS — J069 Acute upper respiratory infection, unspecified: Secondary | ICD-10-CM | POA: Diagnosis not present

## 2016-02-09 DIAGNOSIS — I10 Essential (primary) hypertension: Secondary | ICD-10-CM | POA: Diagnosis not present

## 2016-04-04 DIAGNOSIS — J342 Deviated nasal septum: Secondary | ICD-10-CM | POA: Diagnosis not present

## 2016-04-04 DIAGNOSIS — H903 Sensorineural hearing loss, bilateral: Secondary | ICD-10-CM | POA: Diagnosis not present

## 2016-04-29 DIAGNOSIS — Z87898 Personal history of other specified conditions: Secondary | ICD-10-CM | POA: Diagnosis not present

## 2016-04-29 DIAGNOSIS — I10 Essential (primary) hypertension: Secondary | ICD-10-CM | POA: Diagnosis not present

## 2016-05-09 DIAGNOSIS — E7800 Pure hypercholesterolemia, unspecified: Secondary | ICD-10-CM | POA: Diagnosis not present

## 2016-05-09 DIAGNOSIS — I10 Essential (primary) hypertension: Secondary | ICD-10-CM | POA: Diagnosis not present

## 2016-05-09 DIAGNOSIS — F17200 Nicotine dependence, unspecified, uncomplicated: Secondary | ICD-10-CM | POA: Diagnosis not present

## 2016-05-09 DIAGNOSIS — F5104 Psychophysiologic insomnia: Secondary | ICD-10-CM | POA: Diagnosis not present

## 2016-05-09 DIAGNOSIS — E559 Vitamin D deficiency, unspecified: Secondary | ICD-10-CM | POA: Diagnosis not present

## 2016-09-23 DIAGNOSIS — S52592A Other fractures of lower end of left radius, initial encounter for closed fracture: Secondary | ICD-10-CM | POA: Diagnosis not present

## 2016-09-23 DIAGNOSIS — S6992XA Unspecified injury of left wrist, hand and finger(s), initial encounter: Secondary | ICD-10-CM | POA: Diagnosis not present

## 2016-09-23 DIAGNOSIS — S52502A Unspecified fracture of the lower end of left radius, initial encounter for closed fracture: Secondary | ICD-10-CM | POA: Diagnosis not present

## 2016-10-04 DIAGNOSIS — E559 Vitamin D deficiency, unspecified: Secondary | ICD-10-CM | POA: Diagnosis not present

## 2016-10-04 DIAGNOSIS — E7800 Pure hypercholesterolemia, unspecified: Secondary | ICD-10-CM | POA: Diagnosis not present

## 2016-10-04 DIAGNOSIS — I10 Essential (primary) hypertension: Secondary | ICD-10-CM | POA: Diagnosis not present

## 2016-10-09 DIAGNOSIS — S52552A Other extraarticular fracture of lower end of left radius, initial encounter for closed fracture: Secondary | ICD-10-CM | POA: Diagnosis not present

## 2016-10-09 DIAGNOSIS — R7303 Prediabetes: Secondary | ICD-10-CM | POA: Diagnosis not present

## 2016-10-09 DIAGNOSIS — E7800 Pure hypercholesterolemia, unspecified: Secondary | ICD-10-CM | POA: Diagnosis not present

## 2016-10-09 DIAGNOSIS — I10 Essential (primary) hypertension: Secondary | ICD-10-CM | POA: Diagnosis not present

## 2016-10-09 DIAGNOSIS — Z0000 Encounter for general adult medical examination without abnormal findings: Secondary | ICD-10-CM | POA: Diagnosis not present

## 2016-10-09 DIAGNOSIS — F17200 Nicotine dependence, unspecified, uncomplicated: Secondary | ICD-10-CM | POA: Diagnosis not present

## 2016-10-09 DIAGNOSIS — M25532 Pain in left wrist: Secondary | ICD-10-CM | POA: Diagnosis not present

## 2016-10-09 DIAGNOSIS — Z23 Encounter for immunization: Secondary | ICD-10-CM | POA: Diagnosis not present

## 2016-11-06 DIAGNOSIS — J209 Acute bronchitis, unspecified: Secondary | ICD-10-CM | POA: Diagnosis not present

## 2016-11-06 DIAGNOSIS — J069 Acute upper respiratory infection, unspecified: Secondary | ICD-10-CM | POA: Diagnosis not present

## 2016-11-06 DIAGNOSIS — R05 Cough: Secondary | ICD-10-CM | POA: Diagnosis not present

## 2017-01-27 DIAGNOSIS — F17200 Nicotine dependence, unspecified, uncomplicated: Secondary | ICD-10-CM | POA: Diagnosis not present

## 2017-01-27 DIAGNOSIS — J411 Mucopurulent chronic bronchitis: Secondary | ICD-10-CM | POA: Diagnosis not present

## 2017-02-18 DIAGNOSIS — F17200 Nicotine dependence, unspecified, uncomplicated: Secondary | ICD-10-CM | POA: Diagnosis not present

## 2017-02-18 DIAGNOSIS — R05 Cough: Secondary | ICD-10-CM | POA: Diagnosis not present

## 2017-03-24 DIAGNOSIS — I10 Essential (primary) hypertension: Secondary | ICD-10-CM | POA: Diagnosis not present

## 2017-03-24 DIAGNOSIS — F17200 Nicotine dependence, unspecified, uncomplicated: Secondary | ICD-10-CM | POA: Diagnosis not present

## 2017-03-25 DIAGNOSIS — I10 Essential (primary) hypertension: Secondary | ICD-10-CM | POA: Diagnosis not present

## 2017-03-27 DIAGNOSIS — I10 Essential (primary) hypertension: Secondary | ICD-10-CM | POA: Diagnosis not present

## 2017-03-27 DIAGNOSIS — E7800 Pure hypercholesterolemia, unspecified: Secondary | ICD-10-CM | POA: Diagnosis not present

## 2017-03-27 DIAGNOSIS — R7303 Prediabetes: Secondary | ICD-10-CM | POA: Diagnosis not present

## 2017-04-03 DIAGNOSIS — F5104 Psychophysiologic insomnia: Secondary | ICD-10-CM | POA: Diagnosis not present

## 2017-04-03 DIAGNOSIS — R7303 Prediabetes: Secondary | ICD-10-CM | POA: Diagnosis not present

## 2017-04-03 DIAGNOSIS — I10 Essential (primary) hypertension: Secondary | ICD-10-CM | POA: Diagnosis not present

## 2017-04-03 DIAGNOSIS — E7800 Pure hypercholesterolemia, unspecified: Secondary | ICD-10-CM | POA: Diagnosis not present

## 2017-04-25 DIAGNOSIS — R413 Other amnesia: Secondary | ICD-10-CM | POA: Diagnosis not present

## 2017-06-10 DIAGNOSIS — F0280 Dementia in other diseases classified elsewhere without behavioral disturbance: Secondary | ICD-10-CM | POA: Diagnosis not present

## 2017-06-10 DIAGNOSIS — G309 Alzheimer's disease, unspecified: Secondary | ICD-10-CM | POA: Diagnosis not present

## 2017-06-10 DIAGNOSIS — R413 Other amnesia: Secondary | ICD-10-CM | POA: Diagnosis not present

## 2017-06-19 DIAGNOSIS — R2689 Other abnormalities of gait and mobility: Secondary | ICD-10-CM | POA: Diagnosis not present

## 2017-06-19 DIAGNOSIS — S00412A Abrasion of left ear, initial encounter: Secondary | ICD-10-CM | POA: Diagnosis not present

## 2017-07-30 DIAGNOSIS — R7303 Prediabetes: Secondary | ICD-10-CM | POA: Diagnosis not present

## 2017-07-30 DIAGNOSIS — I10 Essential (primary) hypertension: Secondary | ICD-10-CM | POA: Diagnosis not present

## 2017-07-30 DIAGNOSIS — E7800 Pure hypercholesterolemia, unspecified: Secondary | ICD-10-CM | POA: Diagnosis not present

## 2017-07-31 DIAGNOSIS — M533 Sacrococcygeal disorders, not elsewhere classified: Secondary | ICD-10-CM | POA: Diagnosis not present

## 2017-07-31 DIAGNOSIS — M47816 Spondylosis without myelopathy or radiculopathy, lumbar region: Secondary | ICD-10-CM | POA: Diagnosis not present

## 2017-08-06 DIAGNOSIS — I10 Essential (primary) hypertension: Secondary | ICD-10-CM | POA: Diagnosis not present

## 2017-08-06 DIAGNOSIS — M533 Sacrococcygeal disorders, not elsewhere classified: Secondary | ICD-10-CM | POA: Diagnosis not present

## 2017-08-06 DIAGNOSIS — R7303 Prediabetes: Secondary | ICD-10-CM | POA: Diagnosis not present

## 2017-08-06 DIAGNOSIS — E7800 Pure hypercholesterolemia, unspecified: Secondary | ICD-10-CM | POA: Diagnosis not present

## 2017-08-20 DIAGNOSIS — F5104 Psychophysiologic insomnia: Secondary | ICD-10-CM | POA: Diagnosis not present

## 2017-09-02 DIAGNOSIS — M48062 Spinal stenosis, lumbar region with neurogenic claudication: Secondary | ICD-10-CM | POA: Diagnosis not present

## 2017-09-02 DIAGNOSIS — M5416 Radiculopathy, lumbar region: Secondary | ICD-10-CM | POA: Diagnosis not present

## 2017-09-02 DIAGNOSIS — M5136 Other intervertebral disc degeneration, lumbar region: Secondary | ICD-10-CM | POA: Diagnosis not present

## 2017-11-19 DIAGNOSIS — F17200 Nicotine dependence, unspecified, uncomplicated: Secondary | ICD-10-CM | POA: Diagnosis not present

## 2017-11-19 DIAGNOSIS — F0280 Dementia in other diseases classified elsewhere without behavioral disturbance: Secondary | ICD-10-CM | POA: Diagnosis not present

## 2017-11-19 DIAGNOSIS — G309 Alzheimer's disease, unspecified: Secondary | ICD-10-CM | POA: Diagnosis not present

## 2017-11-19 DIAGNOSIS — S0990XA Unspecified injury of head, initial encounter: Secondary | ICD-10-CM | POA: Diagnosis not present

## 2017-11-20 ENCOUNTER — Other Ambulatory Visit: Payer: Self-pay | Admitting: Family Medicine

## 2017-11-20 DIAGNOSIS — F0280 Dementia in other diseases classified elsewhere without behavioral disturbance: Secondary | ICD-10-CM

## 2017-11-20 DIAGNOSIS — G309 Alzheimer's disease, unspecified: Principal | ICD-10-CM

## 2017-11-26 DIAGNOSIS — E7800 Pure hypercholesterolemia, unspecified: Secondary | ICD-10-CM | POA: Diagnosis not present

## 2017-11-26 DIAGNOSIS — I10 Essential (primary) hypertension: Secondary | ICD-10-CM | POA: Diagnosis not present

## 2017-11-26 DIAGNOSIS — E559 Vitamin D deficiency, unspecified: Secondary | ICD-10-CM | POA: Diagnosis not present

## 2017-11-26 DIAGNOSIS — R7303 Prediabetes: Secondary | ICD-10-CM | POA: Diagnosis not present

## 2017-11-26 DIAGNOSIS — Z0000 Encounter for general adult medical examination without abnormal findings: Secondary | ICD-10-CM | POA: Diagnosis not present

## 2017-11-26 DIAGNOSIS — F17200 Nicotine dependence, unspecified, uncomplicated: Secondary | ICD-10-CM | POA: Diagnosis not present

## 2017-12-04 DIAGNOSIS — I10 Essential (primary) hypertension: Secondary | ICD-10-CM | POA: Diagnosis not present

## 2017-12-04 DIAGNOSIS — E559 Vitamin D deficiency, unspecified: Secondary | ICD-10-CM | POA: Diagnosis not present

## 2017-12-04 DIAGNOSIS — E7800 Pure hypercholesterolemia, unspecified: Secondary | ICD-10-CM | POA: Diagnosis not present

## 2017-12-04 DIAGNOSIS — F17200 Nicotine dependence, unspecified, uncomplicated: Secondary | ICD-10-CM | POA: Diagnosis not present

## 2017-12-04 DIAGNOSIS — R7303 Prediabetes: Secondary | ICD-10-CM | POA: Diagnosis not present

## 2017-12-19 ENCOUNTER — Ambulatory Visit
Admission: RE | Admit: 2017-12-19 | Discharge: 2017-12-19 | Disposition: A | Discharge status: Home / Self Care | Payer: Medicare HMO | Source: Ambulatory Visit | Attending: Family Medicine | Admitting: Family Medicine

## 2017-12-19 DIAGNOSIS — R51 Headache: Secondary | ICD-10-CM | POA: Diagnosis not present

## 2017-12-19 DIAGNOSIS — G319 Degenerative disease of nervous system, unspecified: Secondary | ICD-10-CM | POA: Diagnosis not present

## 2017-12-19 DIAGNOSIS — I672 Cerebral atherosclerosis: Secondary | ICD-10-CM | POA: Diagnosis not present

## 2017-12-19 DIAGNOSIS — I6523 Occlusion and stenosis of bilateral carotid arteries: Secondary | ICD-10-CM | POA: Insufficient documentation

## 2017-12-19 DIAGNOSIS — G309 Alzheimer's disease, unspecified: Secondary | ICD-10-CM | POA: Diagnosis not present

## 2017-12-19 DIAGNOSIS — J3489 Other specified disorders of nose and nasal sinuses: Secondary | ICD-10-CM | POA: Insufficient documentation

## 2017-12-19 DIAGNOSIS — I739 Peripheral vascular disease, unspecified: Secondary | ICD-10-CM | POA: Insufficient documentation

## 2017-12-19 DIAGNOSIS — F0280 Dementia in other diseases classified elsewhere without behavioral disturbance: Secondary | ICD-10-CM | POA: Diagnosis not present

## 2018-03-27 DIAGNOSIS — E7800 Pure hypercholesterolemia, unspecified: Secondary | ICD-10-CM | POA: Diagnosis not present

## 2018-03-27 DIAGNOSIS — G309 Alzheimer's disease, unspecified: Secondary | ICD-10-CM | POA: Diagnosis not present

## 2018-03-27 DIAGNOSIS — F0280 Dementia in other diseases classified elsewhere without behavioral disturbance: Secondary | ICD-10-CM | POA: Diagnosis not present

## 2018-03-27 DIAGNOSIS — R7303 Prediabetes: Secondary | ICD-10-CM | POA: Diagnosis not present

## 2018-03-27 DIAGNOSIS — I10 Essential (primary) hypertension: Secondary | ICD-10-CM | POA: Diagnosis not present

## 2018-03-27 DIAGNOSIS — E559 Vitamin D deficiency, unspecified: Secondary | ICD-10-CM | POA: Diagnosis not present

## 2018-03-27 DIAGNOSIS — F5104 Psychophysiologic insomnia: Secondary | ICD-10-CM | POA: Diagnosis not present

## 2018-03-30 DIAGNOSIS — E559 Vitamin D deficiency, unspecified: Secondary | ICD-10-CM | POA: Diagnosis not present

## 2018-03-30 DIAGNOSIS — I10 Essential (primary) hypertension: Secondary | ICD-10-CM | POA: Diagnosis not present

## 2018-03-30 DIAGNOSIS — E7800 Pure hypercholesterolemia, unspecified: Secondary | ICD-10-CM | POA: Diagnosis not present

## 2018-03-30 DIAGNOSIS — R7303 Prediabetes: Secondary | ICD-10-CM | POA: Diagnosis not present

## 2018-05-04 DIAGNOSIS — G309 Alzheimer's disease, unspecified: Secondary | ICD-10-CM | POA: Diagnosis not present

## 2018-05-04 DIAGNOSIS — R0602 Shortness of breath: Secondary | ICD-10-CM | POA: Diagnosis not present

## 2018-05-04 DIAGNOSIS — R05 Cough: Secondary | ICD-10-CM | POA: Diagnosis not present

## 2018-05-04 DIAGNOSIS — F0280 Dementia in other diseases classified elsewhere without behavioral disturbance: Secondary | ICD-10-CM | POA: Diagnosis not present

## 2018-06-03 DIAGNOSIS — J159 Unspecified bacterial pneumonia: Secondary | ICD-10-CM | POA: Diagnosis not present

## 2018-06-24 DIAGNOSIS — L03211 Cellulitis of face: Secondary | ICD-10-CM | POA: Diagnosis not present

## 2018-06-24 DIAGNOSIS — I10 Essential (primary) hypertension: Secondary | ICD-10-CM | POA: Diagnosis not present

## 2018-08-04 DIAGNOSIS — E7800 Pure hypercholesterolemia, unspecified: Secondary | ICD-10-CM | POA: Diagnosis not present

## 2018-08-04 DIAGNOSIS — F0280 Dementia in other diseases classified elsewhere without behavioral disturbance: Secondary | ICD-10-CM | POA: Diagnosis not present

## 2018-08-04 DIAGNOSIS — F17200 Nicotine dependence, unspecified, uncomplicated: Secondary | ICD-10-CM | POA: Diagnosis not present

## 2018-08-04 DIAGNOSIS — I10 Essential (primary) hypertension: Secondary | ICD-10-CM | POA: Diagnosis not present

## 2018-08-04 DIAGNOSIS — G309 Alzheimer's disease, unspecified: Secondary | ICD-10-CM | POA: Diagnosis not present

## 2018-08-04 DIAGNOSIS — R7303 Prediabetes: Secondary | ICD-10-CM | POA: Diagnosis not present

## 2018-12-14 DIAGNOSIS — F17200 Nicotine dependence, unspecified, uncomplicated: Secondary | ICD-10-CM | POA: Diagnosis not present

## 2018-12-14 DIAGNOSIS — E7800 Pure hypercholesterolemia, unspecified: Secondary | ICD-10-CM | POA: Diagnosis not present

## 2018-12-14 DIAGNOSIS — I10 Essential (primary) hypertension: Secondary | ICD-10-CM | POA: Diagnosis not present

## 2018-12-14 DIAGNOSIS — R7303 Prediabetes: Secondary | ICD-10-CM | POA: Diagnosis not present

## 2018-12-14 DIAGNOSIS — F0280 Dementia in other diseases classified elsewhere without behavioral disturbance: Secondary | ICD-10-CM | POA: Diagnosis not present

## 2018-12-14 DIAGNOSIS — G309 Alzheimer's disease, unspecified: Secondary | ICD-10-CM | POA: Diagnosis not present

## 2018-12-14 DIAGNOSIS — Z0000 Encounter for general adult medical examination without abnormal findings: Secondary | ICD-10-CM | POA: Diagnosis not present

## 2018-12-24 DIAGNOSIS — J069 Acute upper respiratory infection, unspecified: Secondary | ICD-10-CM | POA: Diagnosis not present

## 2018-12-24 DIAGNOSIS — R0982 Postnasal drip: Secondary | ICD-10-CM | POA: Diagnosis not present

## 2019-01-25 DIAGNOSIS — R197 Diarrhea, unspecified: Secondary | ICD-10-CM | POA: Diagnosis not present

## 2019-01-25 DIAGNOSIS — R6889 Other general symptoms and signs: Secondary | ICD-10-CM | POA: Diagnosis not present

## 2019-03-01 DIAGNOSIS — R05 Cough: Secondary | ICD-10-CM | POA: Diagnosis not present

## 2019-03-01 DIAGNOSIS — F17200 Nicotine dependence, unspecified, uncomplicated: Secondary | ICD-10-CM | POA: Diagnosis not present

## 2019-03-01 DIAGNOSIS — R918 Other nonspecific abnormal finding of lung field: Secondary | ICD-10-CM | POA: Diagnosis not present

## 2019-03-01 DIAGNOSIS — L03116 Cellulitis of left lower limb: Secondary | ICD-10-CM | POA: Diagnosis not present

## 2019-03-01 DIAGNOSIS — S51012A Laceration without foreign body of left elbow, initial encounter: Secondary | ICD-10-CM | POA: Diagnosis not present

## 2019-03-01 DIAGNOSIS — W010XXA Fall on same level from slipping, tripping and stumbling without subsequent striking against object, initial encounter: Secondary | ICD-10-CM | POA: Diagnosis not present

## 2019-04-20 DIAGNOSIS — I10 Essential (primary) hypertension: Secondary | ICD-10-CM | POA: Diagnosis not present

## 2019-04-20 DIAGNOSIS — E7800 Pure hypercholesterolemia, unspecified: Secondary | ICD-10-CM | POA: Diagnosis not present

## 2019-04-20 DIAGNOSIS — R7303 Prediabetes: Secondary | ICD-10-CM | POA: Diagnosis not present

## 2019-04-20 DIAGNOSIS — F17200 Nicotine dependence, unspecified, uncomplicated: Secondary | ICD-10-CM | POA: Diagnosis not present

## 2019-04-26 DIAGNOSIS — F17200 Nicotine dependence, unspecified, uncomplicated: Secondary | ICD-10-CM | POA: Diagnosis not present

## 2019-04-26 DIAGNOSIS — G309 Alzheimer's disease, unspecified: Secondary | ICD-10-CM | POA: Diagnosis not present

## 2019-04-26 DIAGNOSIS — E7800 Pure hypercholesterolemia, unspecified: Secondary | ICD-10-CM | POA: Diagnosis not present

## 2019-04-26 DIAGNOSIS — F5104 Psychophysiologic insomnia: Secondary | ICD-10-CM | POA: Diagnosis not present

## 2019-04-26 DIAGNOSIS — Z862 Personal history of diseases of the blood and blood-forming organs and certain disorders involving the immune mechanism: Secondary | ICD-10-CM | POA: Diagnosis not present

## 2019-04-26 DIAGNOSIS — I10 Essential (primary) hypertension: Secondary | ICD-10-CM | POA: Diagnosis not present

## 2019-04-26 DIAGNOSIS — F0280 Dementia in other diseases classified elsewhere without behavioral disturbance: Secondary | ICD-10-CM | POA: Diagnosis not present

## 2019-05-14 DIAGNOSIS — F329 Major depressive disorder, single episode, unspecified: Secondary | ICD-10-CM | POA: Diagnosis not present

## 2019-05-14 DIAGNOSIS — R5383 Other fatigue: Secondary | ICD-10-CM | POA: Diagnosis not present

## 2019-06-23 DIAGNOSIS — R5383 Other fatigue: Secondary | ICD-10-CM | POA: Diagnosis not present

## 2019-06-23 DIAGNOSIS — I10 Essential (primary) hypertension: Secondary | ICD-10-CM | POA: Diagnosis not present

## 2019-06-23 DIAGNOSIS — R197 Diarrhea, unspecified: Secondary | ICD-10-CM | POA: Diagnosis not present

## 2019-06-30 DIAGNOSIS — I952 Hypotension due to drugs: Secondary | ICD-10-CM | POA: Diagnosis not present

## 2019-06-30 DIAGNOSIS — Z87898 Personal history of other specified conditions: Secondary | ICD-10-CM | POA: Diagnosis not present

## 2019-06-30 DIAGNOSIS — N289 Disorder of kidney and ureter, unspecified: Secondary | ICD-10-CM | POA: Diagnosis not present

## 2019-06-30 DIAGNOSIS — E876 Hypokalemia: Secondary | ICD-10-CM | POA: Diagnosis not present

## 2019-07-01 DIAGNOSIS — N289 Disorder of kidney and ureter, unspecified: Secondary | ICD-10-CM | POA: Diagnosis not present

## 2019-07-01 DIAGNOSIS — E876 Hypokalemia: Secondary | ICD-10-CM | POA: Diagnosis not present

## 2019-07-29 ENCOUNTER — Emergency Department: Payer: Medicare HMO

## 2019-07-29 ENCOUNTER — Inpatient Hospital Stay
Admission: EM | Admit: 2019-07-29 | Discharge: 2019-08-04 | DRG: 536 | Disposition: A | Discharge status: Skilled Nursing Facility | Payer: Medicare HMO | Attending: Internal Medicine | Admitting: Internal Medicine

## 2019-07-29 ENCOUNTER — Other Ambulatory Visit: Payer: Self-pay

## 2019-07-29 DIAGNOSIS — M549 Dorsalgia, unspecified: Secondary | ICD-10-CM | POA: Diagnosis present

## 2019-07-29 DIAGNOSIS — W1830XA Fall on same level, unspecified, initial encounter: Secondary | ICD-10-CM | POA: Diagnosis present

## 2019-07-29 DIAGNOSIS — S79911A Unspecified injury of right hip, initial encounter: Secondary | ICD-10-CM | POA: Diagnosis not present

## 2019-07-29 DIAGNOSIS — Z20828 Contact with and (suspected) exposure to other viral communicable diseases: Secondary | ICD-10-CM | POA: Diagnosis present

## 2019-07-29 DIAGNOSIS — N390 Urinary tract infection, site not specified: Secondary | ICD-10-CM | POA: Diagnosis not present

## 2019-07-29 DIAGNOSIS — E878 Other disorders of electrolyte and fluid balance, not elsewhere classified: Secondary | ICD-10-CM | POA: Diagnosis present

## 2019-07-29 DIAGNOSIS — Z03818 Encounter for observation for suspected exposure to other biological agents ruled out: Secondary | ICD-10-CM | POA: Diagnosis not present

## 2019-07-29 DIAGNOSIS — W19XXXA Unspecified fall, initial encounter: Secondary | ICD-10-CM

## 2019-07-29 DIAGNOSIS — M2550 Pain in unspecified joint: Secondary | ICD-10-CM | POA: Diagnosis not present

## 2019-07-29 DIAGNOSIS — B9620 Unspecified Escherichia coli [E. coli] as the cause of diseases classified elsewhere: Secondary | ICD-10-CM | POA: Diagnosis present

## 2019-07-29 DIAGNOSIS — M25551 Pain in right hip: Secondary | ICD-10-CM

## 2019-07-29 DIAGNOSIS — S32409A Unspecified fracture of unspecified acetabulum, initial encounter for closed fracture: Secondary | ICD-10-CM | POA: Diagnosis present

## 2019-07-29 DIAGNOSIS — S72001D Fracture of unspecified part of neck of right femur, subsequent encounter for closed fracture with routine healing: Secondary | ICD-10-CM | POA: Diagnosis not present

## 2019-07-29 DIAGNOSIS — S32501A Unspecified fracture of right pubis, initial encounter for closed fracture: Secondary | ICD-10-CM | POA: Diagnosis not present

## 2019-07-29 DIAGNOSIS — E785 Hyperlipidemia, unspecified: Secondary | ICD-10-CM | POA: Diagnosis not present

## 2019-07-29 DIAGNOSIS — I10 Essential (primary) hypertension: Secondary | ICD-10-CM | POA: Diagnosis not present

## 2019-07-29 DIAGNOSIS — R1310 Dysphagia, unspecified: Secondary | ICD-10-CM | POA: Diagnosis not present

## 2019-07-29 DIAGNOSIS — M25572 Pain in left ankle and joints of left foot: Secondary | ICD-10-CM | POA: Diagnosis not present

## 2019-07-29 DIAGNOSIS — F1010 Alcohol abuse, uncomplicated: Secondary | ICD-10-CM | POA: Diagnosis not present

## 2019-07-29 DIAGNOSIS — B9689 Other specified bacterial agents as the cause of diseases classified elsewhere: Secondary | ICD-10-CM | POA: Diagnosis present

## 2019-07-29 DIAGNOSIS — S299XXA Unspecified injury of thorax, initial encounter: Secondary | ICD-10-CM | POA: Diagnosis not present

## 2019-07-29 DIAGNOSIS — K219 Gastro-esophageal reflux disease without esophagitis: Secondary | ICD-10-CM | POA: Diagnosis present

## 2019-07-29 DIAGNOSIS — R262 Difficulty in walking, not elsewhere classified: Secondary | ICD-10-CM | POA: Diagnosis not present

## 2019-07-29 DIAGNOSIS — Z823 Family history of stroke: Secondary | ICD-10-CM

## 2019-07-29 DIAGNOSIS — F329 Major depressive disorder, single episode, unspecified: Secondary | ICD-10-CM | POA: Diagnosis present

## 2019-07-29 DIAGNOSIS — S32599A Other specified fracture of unspecified pubis, initial encounter for closed fracture: Secondary | ICD-10-CM | POA: Insufficient documentation

## 2019-07-29 DIAGNOSIS — Z23 Encounter for immunization: Secondary | ICD-10-CM

## 2019-07-29 DIAGNOSIS — S32401A Unspecified fracture of right acetabulum, initial encounter for closed fracture: Principal | ICD-10-CM | POA: Diagnosis present

## 2019-07-29 DIAGNOSIS — R0902 Hypoxemia: Secondary | ICD-10-CM | POA: Diagnosis not present

## 2019-07-29 DIAGNOSIS — Y92019 Unspecified place in single-family (private) house as the place of occurrence of the external cause: Secondary | ICD-10-CM

## 2019-07-29 DIAGNOSIS — F17200 Nicotine dependence, unspecified, uncomplicated: Secondary | ICD-10-CM | POA: Diagnosis present

## 2019-07-29 DIAGNOSIS — S32591A Other specified fracture of right pubis, initial encounter for closed fracture: Secondary | ICD-10-CM | POA: Diagnosis not present

## 2019-07-29 DIAGNOSIS — S72001A Fracture of unspecified part of neck of right femur, initial encounter for closed fracture: Secondary | ICD-10-CM

## 2019-07-29 DIAGNOSIS — S32511A Fracture of superior rim of right pubis, initial encounter for closed fracture: Secondary | ICD-10-CM | POA: Diagnosis not present

## 2019-07-29 DIAGNOSIS — E876 Hypokalemia: Secondary | ICD-10-CM | POA: Diagnosis not present

## 2019-07-29 DIAGNOSIS — Z7401 Bed confinement status: Secondary | ICD-10-CM | POA: Diagnosis not present

## 2019-07-29 DIAGNOSIS — Z716 Tobacco abuse counseling: Secondary | ICD-10-CM | POA: Diagnosis not present

## 2019-07-29 DIAGNOSIS — R4189 Other symptoms and signs involving cognitive functions and awareness: Secondary | ICD-10-CM | POA: Diagnosis not present

## 2019-07-29 DIAGNOSIS — M6281 Muscle weakness (generalized): Secondary | ICD-10-CM | POA: Diagnosis not present

## 2019-07-29 DIAGNOSIS — R41841 Cognitive communication deficit: Secondary | ICD-10-CM | POA: Diagnosis not present

## 2019-07-29 DIAGNOSIS — S32431A Displaced fracture of anterior column [iliopubic] of right acetabulum, initial encounter for closed fracture: Secondary | ICD-10-CM | POA: Diagnosis not present

## 2019-07-29 HISTORY — DX: Gastro-esophageal reflux disease without esophagitis: K21.9

## 2019-07-29 HISTORY — DX: Alcohol abuse, uncomplicated: F10.10

## 2019-07-29 HISTORY — DX: Hyperlipidemia, unspecified: E78.5

## 2019-07-29 HISTORY — DX: Essential (primary) hypertension: I10

## 2019-07-29 LAB — CBC WITH DIFFERENTIAL/PLATELET
Abs Immature Granulocytes: 0.13 10*3/uL — ABNORMAL HIGH (ref 0.00–0.07)
Basophils Absolute: 0.1 10*3/uL (ref 0.0–0.1)
Basophils Relative: 0 %
Eosinophils Absolute: 0 10*3/uL (ref 0.0–0.5)
Eosinophils Relative: 0 %
HCT: 32.8 % — ABNORMAL LOW (ref 39.0–52.0)
Hemoglobin: 11.7 g/dL — ABNORMAL LOW (ref 13.0–17.0)
Immature Granulocytes: 1 %
Lymphocytes Relative: 8 %
Lymphs Abs: 1.2 10*3/uL (ref 0.7–4.0)
MCH: 37.3 pg — ABNORMAL HIGH (ref 26.0–34.0)
MCHC: 35.7 g/dL (ref 30.0–36.0)
MCV: 104.5 fL — ABNORMAL HIGH (ref 80.0–100.0)
Monocytes Absolute: 0.9 10*3/uL (ref 0.1–1.0)
Monocytes Relative: 7 %
Neutro Abs: 11.6 10*3/uL — ABNORMAL HIGH (ref 1.7–7.7)
Neutrophils Relative %: 84 %
Platelets: 314 10*3/uL (ref 150–400)
RBC: 3.14 MIL/uL — ABNORMAL LOW (ref 4.22–5.81)
RDW: 16.2 % — ABNORMAL HIGH (ref 11.5–15.5)
WBC: 13.9 10*3/uL — ABNORMAL HIGH (ref 4.0–10.5)
nRBC: 0 % (ref 0.0–0.2)

## 2019-07-29 LAB — URINALYSIS, COMPLETE (UACMP) WITH MICROSCOPIC
Bilirubin Urine: NEGATIVE
Glucose, UA: NEGATIVE mg/dL
Hgb urine dipstick: NEGATIVE
Ketones, ur: 5 mg/dL — AB
Nitrite: POSITIVE — AB
Protein, ur: NEGATIVE mg/dL
Specific Gravity, Urine: 1.015 (ref 1.005–1.030)
WBC, UA: >50 WBC/hpf — ABNORMAL HIGH (ref 0–5)
pH: 5 (ref 5.0–8.0)

## 2019-07-29 LAB — BASIC METABOLIC PANEL
Anion gap: 15 (ref 5–15)
BUN: 12 mg/dL (ref 8–23)
CO2: 23 mmol/L (ref 22–32)
Calcium: 9.1 mg/dL (ref 8.9–10.3)
Chloride: 100 mmol/L (ref 98–111)
Creatinine, Ser: 0.84 mg/dL (ref 0.61–1.24)
GFR calc Af Amer: >60 mL/min (ref >60)
GFR calc non Af Amer: >60 mL/min (ref >60)
Glucose, Bld: 126 mg/dL — ABNORMAL HIGH (ref 70–99)
Potassium: 2.7 mmol/L — CL (ref 3.5–5.1)
Sodium: 138 mmol/L (ref 135–145)

## 2019-07-29 LAB — CK: Total CK: 399 U/L — ABNORMAL HIGH (ref 49–397)

## 2019-07-29 MED ORDER — POTASSIUM CHLORIDE CRYS ER 20 MEQ PO TBCR
40.0000 meq | EXTENDED_RELEASE_TABLET | Freq: Once | ORAL | Status: AC
  Administered 2019-07-29: 21:00:00 40 meq via ORAL
  Filled 2019-07-29: qty 2

## 2019-07-29 MED ORDER — TRAZODONE HCL 50 MG PO TABS
50.0000 mg | ORAL_TABLET | Freq: Every evening | ORAL | Status: DC | PRN
  Administered 2019-07-29: 50 mg via ORAL
  Filled 2019-07-29: qty 1

## 2019-07-29 MED ORDER — SODIUM CHLORIDE 0.9 % IV SOLN
1.0000 g | Freq: Once | INTRAVENOUS | Status: AC
  Administered 2019-07-29: 1 g via INTRAVENOUS
  Filled 2019-07-29: qty 10

## 2019-07-29 MED ORDER — ONDANSETRON HCL 4 MG/2ML IJ SOLN: 4.0000 mg | Freq: Four times a day (QID) | INTRAMUSCULAR | Status: DC | PRN

## 2019-07-29 MED ORDER — FENTANYL CITRATE (PF) 100 MCG/2ML IJ SOLN
50.0000 ug | Freq: Once | INTRAMUSCULAR | Status: AC
  Administered 2019-07-29: 21:00:00 50 ug via INTRAVENOUS
  Filled 2019-07-29: qty 2

## 2019-07-29 MED ORDER — ENOXAPARIN SODIUM 40 MG/0.4ML ~~LOC~~ SOLN
40.0000 mg | Freq: Every day | SUBCUTANEOUS | Status: DC
  Administered 2019-07-30 – 2019-08-03 (×5): 40 mg via SUBCUTANEOUS
  Filled 2019-07-29 (×5): qty 0.4

## 2019-07-29 MED ORDER — SODIUM CHLORIDE 0.9 % IV BOLUS
1000.0000 mL | Freq: Once | INTRAVENOUS | Status: AC
  Administered 2019-07-29: 1000 mL via INTRAVENOUS

## 2019-07-29 MED ORDER — OXYCODONE HCL 5 MG PO TABS
5.0000 mg | ORAL_TABLET | ORAL | Status: DC | PRN
  Administered 2019-07-30 – 2019-08-04 (×19): 5 mg via ORAL
  Filled 2019-07-29 (×20): qty 1

## 2019-07-29 MED ORDER — PANTOPRAZOLE SODIUM 40 MG PO TBEC
40.0000 mg | DELAYED_RELEASE_TABLET | Freq: Every day | ORAL | Status: DC
  Administered 2019-07-30 – 2019-08-04 (×6): 40 mg via ORAL
  Filled 2019-07-29 (×6): qty 1

## 2019-07-29 MED ORDER — VITAMIN B-1 100 MG PO TABS
100.0000 mg | ORAL_TABLET | Freq: Once | ORAL | Status: AC
  Administered 2019-07-29: 100 mg via ORAL
  Filled 2019-07-29: qty 1

## 2019-07-29 MED ORDER — MORPHINE SULFATE (PF) 4 MG/ML IV SOLN
4.0000 mg | INTRAVENOUS | Status: DC | PRN
  Administered 2019-07-30 – 2019-08-03 (×5): 4 mg via INTRAVENOUS
  Filled 2019-07-29 (×5): qty 1

## 2019-07-29 MED ORDER — ACETAMINOPHEN 325 MG PO TABS
650.0000 mg | ORAL_TABLET | Freq: Four times a day (QID) | ORAL | Status: DC | PRN
  Administered 2019-07-30 – 2019-08-04 (×3): 650 mg via ORAL
  Filled 2019-07-29 (×3): qty 2

## 2019-07-29 MED ORDER — FOLIC ACID 1 MG PO TABS
1.0000 mg | ORAL_TABLET | Freq: Once | ORAL | Status: AC
  Administered 2019-07-29: 21:00:00 1 mg via ORAL
  Filled 2019-07-29: qty 1

## 2019-07-29 MED ORDER — ACETAMINOPHEN 650 MG RE SUPP: 650.0000 mg | Freq: Four times a day (QID) | RECTAL | Status: DC | PRN

## 2019-07-29 MED ORDER — PNEUMOCOCCAL VAC POLYVALENT 25 MCG/0.5ML IJ INJ
0.5000 mL | INJECTION | INTRAMUSCULAR | Status: AC
  Administered 2019-07-30: 10:00:00 0.5 mL via INTRAMUSCULAR
  Filled 2019-07-29: qty 0.5

## 2019-07-29 MED ORDER — ONDANSETRON HCL 4 MG PO TABS: 4.0000 mg | ORAL_TABLET | Freq: Four times a day (QID) | ORAL | Status: DC | PRN

## 2019-07-29 NOTE — ED Notes (Signed)
Grn/lav/blue/red tubes sent to lab.

## 2019-07-29 NOTE — ED Notes (Signed)
Dr. Joan Mayans informed of critical K

## 2019-07-29 NOTE — H&P (Signed)
[facility] - [place] at [facility]   PATIENT NAME: Richard Carter    MR#:  [id number]  DATE OF BIRTH:  06/28/1941  DATE OF ADMISSION:  07/29/2019  PRIMARY CARE PHYSICIAN: Marisue Ivan, MD   REQUESTING/REFERRING PHYSICIAN: Colon Branch, MD  CHIEF COMPLAINT:   Chief Complaint  Patient presents with  . Back Pain  . Fall    HISTORY OF PRESENT ILLNESS:  Richard Carter  is a 78 y.o. male who presents with chief complaint as above.  Patient presents the ED with complaint of right pelvic pain after a fall.  This was a mechanical fall occurred yesterday, and he has had pain since then.  On imaging here he is found to have right pubic ramus and acetabular fracture.  He was also found to have a UTI, though the patient states that he has been asymptomatic except for some urinary hesitancy.  Hospitalist called for admission  PAST MEDICAL HISTORY:   Past Medical History:  Diagnosis Date  . Alcohol abuse   . GERD (gastroesophageal reflux disease)   . HLD (hyperlipidemia)   . HTN (hypertension)      PAST SURGICAL HISTORY:   Past Surgical History:  Procedure Laterality Date  . skin cyst removal       SOCIAL HISTORY:   Social History   Tobacco Use  . Smoking status: Current Every Day Smoker  Substance Use Topics  . Alcohol use: No     FAMILY HISTORY:   Family History  Problem Relation Age of Onset  . Stroke Mother   . Cancer Brother      DRUG ALLERGIES:  No Known Allergies  MEDICATIONS AT HOME:   Prior to Admission medications   Not on File    REVIEW OF SYSTEMS:  Review of Systems  Constitutional: Negative for chills, fever, malaise/fatigue and weight loss.  HENT: Negative for ear pain, hearing loss and tinnitus.   Eyes: Negative for blurred vision, double vision, pain and redness.  Respiratory: Negative for cough, hemoptysis and shortness of breath.   Cardiovascular: Negative for chest pain, palpitations, orthopnea and leg swelling.   Gastrointestinal: Negative for abdominal pain, constipation, diarrhea, nausea and vomiting.  Genitourinary: Negative for dysuria, frequency and hematuria.       Urinary hesitancy  Musculoskeletal: Positive for joint pain (right pelvic region). Negative for back pain and neck pain.  Skin:       No acne, rash, or lesions  Neurological: Negative for dizziness, tremors, focal weakness and weakness.  Endo/Heme/Allergies: Negative for polydipsia. Does not bruise/bleed easily.  Psychiatric/Behavioral: Negative for depression. The patient is not nervous/anxious and does not have insomnia.      VITAL SIGNS:   Vitals:   07/29/19 1851 07/29/19 1852  BP: (!) 151/89   Pulse: 81   Resp: 18   Temp: 98.9 F (37.2 C)   TempSrc: Oral   SpO2: 93%   Weight:  66.2 kg  Height:  5\' 4"  (1.626 m)   Wt Readings from Last 3 Encounters:  07/29/19 66.2 kg  02/05/16 72.6 kg    PHYSICAL EXAMINATION:  Physical Exam  Vitals reviewed. Constitutional: He is oriented to person, place, and time. He appears well-developed and well-nourished. No distress.  HENT:  Head: Normocephalic and atraumatic.  Mouth/Throat: Oropharynx is clear and moist.  Eyes: Pupils are equal, round, and reactive to light. Conjunctivae and EOM are normal. No scleral icterus.  Neck: Normal range of motion. Neck supple. No JVD present. No thyromegaly present.  Cardiovascular:  Normal rate, regular rhythm and intact distal pulses. Exam reveals no gallop and no friction rub.  No murmur heard. Respiratory: Effort normal and breath sounds normal. No respiratory distress. He has no wheezes. He has no rales.  GI: Soft. Bowel sounds are normal. He exhibits no distension. There is no abdominal tenderness.  Musculoskeletal: Normal range of motion.        General: Tenderness (right pelvic region) present. No edema.     Comments: No arthritis, no gout  Lymphadenopathy:    He has no cervical adenopathy.  Neurological: He is alert and oriented to  person, place, and time. No cranial nerve deficit.  No dysarthria, no aphasia  Skin: Skin is warm and dry. No rash noted. No erythema.  Psychiatric: He has a normal mood and affect. His behavior is normal. Judgment and thought content normal.    LABORATORY PANEL:   CBC Recent Labs  Lab 07/29/19 1845  WBC 13.9*  HGB 11.7*  HCT 32.8*  PLT 314   ------------------------------------------------------------------------------------------------------------------  Chemistries  Recent Labs  Lab 07/29/19 1845  NA 138  K 2.7*  CL 100  CO2 23  GLUCOSE 126*  BUN 12  CREATININE 0.84  CALCIUM 9.1   ------------------------------------------------------------------------------------------------------------------  Cardiac Enzymes No results for input(s): TROPONINI in the last 168 hours. ------------------------------------------------------------------------------------------------------------------  RADIOLOGY:  Ct Pelvis Wo Contrast  Result Date: 07/29/2019 CLINICAL DATA:  Hip pain EXAM: CT PELVIS WITHOUT CONTRAST TECHNIQUE: Multidetector CT imaging of the pelvis was performed following the standard protocol without intravenous contrast. COMPARISON:  CT dated September 28, 2013.  This is FINDINGS: Urinary Tract: There is diffuse bladder wall thickening. There appears to be at least 1 bladder diverticulum containing a small calcification. There is asymmetric enlargement of the left seminal vesicle, which currently measures approximately 1.8 x 2.9 cm. This is somewhat similar to prior CT in 2014 and therefore likely represents a benign etiology. Bowel:  Unremarkable visualized pelvic bowel loops. Vascular/Lymphatic: There are advanced atherosclerotic changes involving the partially visualized lower abdominal aorta. Reproductive:  The prostate gland is significantly enlarged. Other:  None Musculoskeletal: There is an acute mildly displaced fracture of the right inferior pubic ramus. There are  acute nondisplaced fractures through the anterior column of the right acetabulum extending into the roof. There is a small pelvic wall hematoma. There are advanced degenerative changes of the partially visualized lower lumbar spine, severe at the L4-L5 and L3-L4 levels. IMPRESSION: 1. Acute fractures of the right acetabulum and right inferior pubic ramus as detailed above. 2. Small right-sided pelvic hematoma. 3. Diffuse bladder wall thickening with at least one bladder diverticulum. Findings are favored to be secondary to chronic outlet obstruction. The prostate gland is significantly enlarged. Aortic Atherosclerosis (ICD10-I70.0). Electronically Signed   By: Constance Holster M.D.   On: 07/29/2019 20:38   Dg Chest Port 1 View  Result Date: 07/29/2019 CLINICAL DATA:  Fall yesterday. Right hip fracture. EXAM: PORTABLE CHEST 1 VIEW COMPARISON:  Radiograph 06/07/2014 FINDINGS: The cardiomediastinal contours are normal. The lungs are clear. Pulmonary vasculature is normal. No consolidation, pleural effusion, or pneumothorax. Multiple left greater than right rib fractures. Fracture of left lateral second rib is age indeterminate. IMPRESSION: Age-indeterminate left lateral second rib fracture, given presence of multiple remote left rib fractures this is likely not acute, however recommend correlation for chest tenderness. No other acute finding. Electronically Signed   By: Keith Rake M.D.   On: 07/29/2019 20:58   Dg Hip Unilat With Pelvis 2-3 Views Right  Result Date: 07/29/2019 CLINICAL DATA:  Fall with hip pain. EXAM: DG HIP (WITH OR WITHOUT PELVIS) 2-3V RIGHT COMPARISON:  None. FINDINGS: There is mild-to-moderate osteoarthritis involving both hips. Phleboliths project over the patient's pelvis. There is no definite displaced fracture or dislocation involving the right femur. There is a subtle irregularity of the right inferior pubic ramus. IMPRESSION: 1. No definite displaced fracture or dislocation  involving the right femur. 2. Subtle irregularity of the right inferior pubic ramus may represent a nondisplaced fracture. Electronically Signed   By: Katherine Mantlehristopher  Green M.D.   On: 07/29/2019 19:54    EKG:   Orders placed or performed during the hospital encounter of 07/29/19  . ED EKG  . ED EKG    IMPRESSION AND PLAN:  Principal Problem:   Acetabular fracture (HCC) -PRN analgesia, orthopedic surgery and PT and OT consults Active Problems:   UTI (urinary tract infection) -IV antibiotics, urine culture   HLD (hyperlipidemia) -home dose antilipid   GERD (gastroesophageal reflux disease) -home dose PPI  Chart review performed and case discussed with ED provider. Labs, imaging and/or ECG reviewed by provider and discussed with patient/family. Management plans discussed with the patient and/or family.  COVID-19 status: Pending  DVT PROPHYLAXIS: SubQ lovenox   GI PROPHYLAXIS:  PPI   ADMISSION STATUS: Observation  CODE STATUS: Full  TOTAL TIME TAKING CARE OF THIS PATIENT: 40 minutes.   This patient was evaluated in the context of the global COVID-19 pandemic, which necessitated consideration that the patient might be at risk for infection with the SARS-CoV-2 virus that causes COVID-19. Institutional protocols and algorithms that pertain to the evaluation of patients at risk for COVID-19 are in a state of rapid change based on information released by regulatory bodies including the CDC and federal and state organizations. These policies and algorithms were followed to the best of this provider's knowledge to date during the patient's care at this facility.  Barney DrainDavid F Jnyah Brazee 07/29/2019, 9:32 PM  [facility]  Office  [phone]  CC: Primary care physician; Marisue IvanLinthavong, [place], MD  Note:  This document was prepared using Dragon voice recognition software and may include unintentional dictation errors.

## 2019-07-29 NOTE — ED Notes (Signed)
Pt otf for imaging 

## 2019-07-29 NOTE — ED Notes (Signed)
ED TO INPATIENT HANDOFF REPORT  ED Nurse Name and Phone #: Janett Billow [phone]   S Name/Age/Gender Richard Carter 78 y.o. male Room/Bed: ED18HA/ED18HA  Code Status   Code Status: Not on file  Home/SNF/Other Home Patient oriented to: self, place, time and situation Is this baseline? Yes   Triage Complete: Triage complete  Chief Complaint Fall  Triage Note Pt in via EMS from home (alone but has friend [name] check on him frequently) d/t back and R hip pain since falling yesterday. Denies hitting head. Alert. ETOH use history. EMS placed IV and gave 46mcg fentanyl. 150/90; NSR; HR 86; sat 92% RA/98% on 2L via [place] per EMS.    Allergies No Known Allergies  Level of Care/Admitting Diagnosis ED Disposition    ED Disposition Condition [facility]: [place] [[id number]]  Level of Care: Med-Surg [16]  Covid Evaluation: Asymptomatic Screening Protocol (No Symptoms)  Diagnosis: Acetabular fracture ([place]) [585929]  Admitting Physician: Lance Coon [[id number]]  Attending Physician: Jannifer Franklin, DAVID [phone]  PT Class (Do Not Modify): Observation [104]  PT Acc Code (Do Not Modify): Observation [10022]       B Medical/Surgery History Past Medical History:  Diagnosis Date  . Alcohol abuse   . GERD (gastroesophageal reflux disease)   . HLD (hyperlipidemia)   . HTN (hypertension)    Past Surgical History:  Procedure Laterality Date  . skin cyst removal       A IV Location/Drains/Wounds Patient Lines/Drains/Airways Status   Active Line/Drains/Airways    Name:   Placement date:   Placement time:   Site:   Days:   Peripheral IV 07/29/19 Left Antecubital   07/29/19    1845    Antecubital   less than 1          Intake/Output Last 24 hours No intake or output data in the 24 hours ending 07/29/19 2149  Labs/Imaging Results for orders placed or performed during the hospital encounter of 07/29/19 (from the past 48 hour(s))  Basic metabolic  panel     Status: Abnormal   Collection Time: 07/29/19  6:45 PM  Result Value Ref Range   Sodium 138 135 - 145 mmol/L   Potassium 2.7 (LL) 3.5 - 5.1 mmol/L    Comment: CRITICAL RESULT CALLED TO, READ BACK BY AND VERIFIED WITH Sims Laday 07/29/19 @ 2027  MLK    Chloride 100 98 - 111 mmol/L   CO2 23 22 - 32 mmol/L   Glucose, Bld 126 (H) 70 - 99 mg/dL   BUN 12 8 - 23 mg/dL   Creatinine, Ser 0.84 0.61 - 1.24 mg/dL   Calcium 9.1 8.9 - 10.3 mg/dL   GFR calc non Af Amer >60 >60 mL/min   GFR calc Af Amer >60 >60 mL/min   Anion gap 15 5 - 15    Comment: Performed at [facility], [address] [place], [place] [address]  CBC with Differential     Status: Abnormal   Collection Time: 07/29/19  6:45 PM  Result Value Ref Range   WBC 13.9 (H) 4.0 - 10.5 K/uL   RBC 3.14 (L) 4.22 - 5.81 MIL/uL   Hemoglobin 11.7 (L) 13.0 - 17.0 g/dL   HCT 32.8 (L) 39.0 - 52.0 %   MCV 104.5 (H) 80.0 - 100.0 fL   MCH 37.3 (H) 26.0 - 34.0 pg   MCHC 35.7 30.0 - 36.0 g/dL   RDW 16.2 (H) 11.5 - 15.5 %  Platelets 314 150 - 400 K/uL   nRBC 0.0 0.0 - 0.2 %   Neutrophils Relative % 84 %   Neutro Abs 11.6 (H) 1.7 - 7.7 K/uL   Lymphocytes Relative 8 %   Lymphs Abs 1.2 0.7 - 4.0 K/uL   Monocytes Relative 7 %   Monocytes Absolute 0.9 0.1 - 1.0 K/uL   Eosinophils Relative 0 %   Eosinophils Absolute 0.0 0.0 - 0.5 K/uL   Basophils Relative 0 %   Basophils Absolute 0.1 0.0 - 0.1 K/uL   Immature Granulocytes 1 %   Abs Immature Granulocytes 0.13 (H) 0.00 - 0.07 K/uL    Comment: Performed at [facility], [address] [place], [place] [address]  [place]     Status: Abnormal   Collection Time: 07/29/19  6:45 PM  Result Value Ref Range   Total CK 399 (H) 49 - 397 U/L    Comment: Performed at [facility], [address] [place], [place] [address]  Urinalysis, Complete w Microscopic     Status: Abnormal   Collection Time: 07/29/19  9:21 PM  Result Value Ref Range   Color, Urine YELLOW  (A) YELLOW   APPearance HAZY (A) CLEAR   Specific Gravity, Urine 1.015 1.005 - 1.030   pH 5.0 5.0 - 8.0   Glucose, UA NEGATIVE NEGATIVE mg/dL   Hgb urine dipstick NEGATIVE NEGATIVE   Bilirubin Urine NEGATIVE NEGATIVE   Ketones, ur 5 (A) NEGATIVE mg/dL   Protein, ur NEGATIVE NEGATIVE mg/dL   Nitrite POSITIVE (A) NEGATIVE   Leukocytes,Ua LARGE (A) NEGATIVE   RBC / HPF 6-10 0 - 5 RBC/hpf   WBC, UA >50 (H) 0 - 5 WBC/hpf   Bacteria, UA MANY (A) NONE SEEN   Squamous Epithelial / LPF 0-5 0 - 5   Mucus PRESENT     Comment: Performed at [facility], [address] [place], [place] [address]   Ct Pelvis Wo Contrast  Result Date: 07/29/2019 CLINICAL DATA:  Hip pain EXAM: CT PELVIS WITHOUT CONTRAST TECHNIQUE: Multidetector CT imaging of the pelvis was performed following the standard protocol without intravenous contrast. COMPARISON:  CT dated September 28, 2013.  This is FINDINGS: Urinary Tract: There is diffuse bladder wall thickening. There appears to be at least 1 bladder diverticulum containing a small calcification. There is asymmetric enlargement of the left seminal vesicle, which currently measures approximately 1.8 x 2.9 cm. This is somewhat similar to prior CT in 2014 and therefore likely represents a benign etiology. Bowel:  Unremarkable visualized pelvic bowel loops. Vascular/Lymphatic: There are advanced atherosclerotic changes involving the partially visualized lower abdominal aorta. Reproductive:  The prostate gland is significantly enlarged. Other:  None Musculoskeletal: There is an acute mildly displaced fracture of the right inferior pubic ramus. There are acute nondisplaced fractures through the anterior column of the right acetabulum extending into the roof. There is a small pelvic wall hematoma. There are advanced degenerative changes of the partially visualized lower lumbar spine, severe at the L4-L5 and L3-L4 levels. IMPRESSION: 1. Acute fractures of the right acetabulum and  right inferior pubic ramus as detailed above. 2. Small right-sided pelvic hematoma. 3. Diffuse bladder wall thickening with at least one bladder diverticulum. Findings are favored to be secondary to chronic outlet obstruction. The prostate gland is significantly enlarged. Aortic Atherosclerosis (ICD10-I70.0). Electronically Signed   By: Katherine Mantlehristopher  Green M.D.   On: 07/29/2019 20:38   Dg Chest Port 1 View  Result Date: 07/29/2019 CLINICAL DATA:  Fall yesterday. Right hip fracture. EXAM: PORTABLE CHEST 1 VIEW COMPARISON:  Radiograph 06/07/2014 FINDINGS: The cardiomediastinal contours are normal. The lungs are clear. Pulmonary vasculature is normal. No consolidation, pleural effusion, or pneumothorax. Multiple left greater than right rib fractures. Fracture of left lateral second rib is age indeterminate. IMPRESSION: Age-indeterminate left lateral second rib fracture, given presence of multiple remote left rib fractures this is likely not acute, however recommend correlation for chest tenderness. No other acute finding. Electronically Signed   By: Narda Rutherford M.D.   On: 07/29/2019 20:58   Dg Hip Unilat With Pelvis 2-3 Views Right  Result Date: 07/29/2019 CLINICAL DATA:  Fall with hip pain. EXAM: DG HIP (WITH OR WITHOUT PELVIS) 2-3V RIGHT COMPARISON:  None. FINDINGS: There is mild-to-moderate osteoarthritis involving both hips. Phleboliths project over the patient's pelvis. There is no definite displaced fracture or dislocation involving the right femur. There is a subtle irregularity of the right inferior pubic ramus. IMPRESSION: 1. No definite displaced fracture or dislocation involving the right femur. 2. Subtle irregularity of the right inferior pubic ramus may represent a nondisplaced fracture. Electronically Signed   By: Katherine Mantle M.D.   On: 07/29/2019 19:54    Pending Labs Unresulted Labs (From admission, onward)    Start     Ordered   07/29/19 2058  SARS CORONAVIRUS 2 (TAT 6-24 HRS)  Nasopharyngeal Nasopharyngeal Swab  (Asymptomatic/Tier 2 Patients Labs)  ONCE - STAT,   STAT    Question Answer Comment  Is this test for diagnosis or screening Screening   Symptomatic for COVID-19 as defined by CDC No   Hospitalized for COVID-19 No   Admitted to ICU for COVID-19 No   Previously tested for COVID-19 No   Resident in a congregate (group) care setting No   Employed in healthcare setting No      07/29/19 2058   Signed and Held  CBC  (enoxaparin (LOVENOX)    CrCl >/= 30 ml/min)  Once,   R    Comments: Baseline for enoxaparin therapy IF NOT ALREADY DRAWN.  Notify MD if PLT < 100 K.    Signed and Held   Signed and Held  Creatinine, serum  (enoxaparin (LOVENOX)    CrCl >/= 30 ml/min)  Once,   R    Comments: Baseline for enoxaparin therapy IF NOT ALREADY DRAWN.    Signed and Held   Signed and Held  Creatinine, serum  (enoxaparin (LOVENOX)    CrCl >/= 30 ml/min)  Weekly,   R    Comments: while on enoxaparin therapy    Signed and Held   Signed and Held  Basic metabolic panel  Tomorrow morning,   R     Signed and Held   Signed and Held  CBC  Tomorrow morning,   R     Signed and Held          Vitals/Pain Today's Vitals   07/29/19 1851 07/29/19 1852 07/29/19 1903  BP: (!) 151/89    Pulse: 81    Resp: 18    Temp: 98.9 F (37.2 C)    TempSrc: Oral    SpO2: 93%    Weight:  66.2 kg   Height:  5\' 4"  (1.626 m)   PainSc:   8     Isolation Precautions No active isolations  Medications Medications  fentaNYL (SUBLIMAZE) injection 50 mcg (50 mcg Intravenous Given 07/29/19 2034)  potassium chloride SA (K-DUR) CR tablet 40 mEq (40 mEq Oral Given 07/29/19 2034)  thiamine (VITAMIN B-1) tablet 100 mg (100 mg Oral Given 07/29/19 2034)  folic acid (FOLVITE) tablet 1 mg (1 mg Oral Given 07/29/19 2034)  sodium chloride 0.9 % bolus 1,000 mL (1,000 mLs Intravenous New Bag/Given 07/29/19 2135)    Mobility non-ambulatory Low fall risk   Focused Assessments 1   R Recommendations:  See Admitting Provider Note  Report given to:   Additional Notes:

## 2019-07-29 NOTE — ED Triage Notes (Signed)
Pt in via EMS from home (alone but has friend [name] check on him frequently) d/t back and R hip pain since falling yesterday. Denies hitting head. Alert. ETOH use history. EMS placed IV and gave 20mcg fentanyl. 150/90; NSR; HR 86; sat 92% RA/98% on 2L via [place] per EMS.

## 2019-07-29 NOTE — ED Notes (Signed)
Pt c/o 8/10 pain in right hip, Dr. Joan Mayans informed, awaiting new orders

## 2019-07-29 NOTE — ED Provider Notes (Addendum)
[facility] Emergency Department Provider Note  ____________________________________________   First MD Initiated Contact with Patient 07/29/19 1837     (approximate)  I have reviewed the triage vital signs and the nursing notes.  History  Chief Complaint Back Pain and Fall    HPI Richard Carter is a 78 y.o. male who presents to the emergency department for right hip pain after a fall.  Patient states he was walking in the middle of the night, when he felt like his leg gave out from under him.  He fell onto his right side, with resultant right hip pain.  He denies any pain elsewhere.  He denies any head injury or loss of consciousness.  He denies any preceding lightheadedness, dizziness, chest pain, or shortness of breath. Has not been able to ambulate since the fall due to pain.         Past Medical Hx History reviewed. No pertinent past medical history.  Problem List There are no active problems to display for this patient.   Past Surgical Hx History reviewed. No pertinent surgical history.  Medications Prior to Admission medications   Not on File    Allergies Patient has no known allergies.  Family Hx History reviewed. No pertinent family history.  Social Hx Social History   Tobacco Use  . Smoking status: Current Every Day Smoker  Substance Use Topics  . Alcohol use: No  . Drug use: Not on file     Review of Systems  Constitutional: Negative for fever. Negative for chills. Eyes: Negative for visual changes. ENT: Negative for sore throat. Cardiovascular: Negative for chest pain. Respiratory: Negative for shortness of breath. Gastrointestinal: Negative for abdominal pain. Negative for nausea. Negative for vomiting. Genitourinary: Negative for dysuria. Musculoskeletal: + R hip pain Skin: Negative for rash. Neurological: Negative for for headaches.   Physical Exam  Vital Signs: ED Triage Vitals  Enc Vitals Group   BP 07/29/19 1851 (!) 151/89     Pulse Rate 07/29/19 1851 81     Resp 07/29/19 1851 18     Temp 07/29/19 1851 98.9 F (37.2 C)     Temp Source 07/29/19 1851 Oral     SpO2 07/29/19 1851 93 %     Weight 07/29/19 1852 146 lb (66.2 kg)     Height 07/29/19 1852 5\' 4"  (1.626 m)     Head Circumference --      Peak Flow --      Pain Score 07/29/19 1903 8     Pain Loc --      Pain Edu? --      Excl. in [place]? --     Constitutional: Alert and oriented. Somewhat disheveled.  Eyes: Conjunctivae clear. Sclera anicteric. Head: Normocephalic. Atraumatic. Nose: No congestion. No rhinorrhea. Mouth/Throat: Mucous membranes are dry.  Neck: No stridor.   Cardiovascular: Normal rate, regular rhythm. Extremities well perfused. Respiratory: Normal respiratory effort. Scattered wheezing throughout. Gastrointestinal: Soft and non-tender. No distention.  Musculoskeletal: R hip TTP, ROM deferred 2/2 known pain. Remainder of RUE, LUE, LLE non-tender to palpation with full ROM. Neurologic:  Normal speech and language. No gross focal neurologic deficits are appreciated.  Skin: Skin is warm, dry and intact. No rash noted. Psychiatric: Mood and affect are appropriate for situation.  EKG  Personally reviewed.   Rate: 98 Rhythm: sinus Axis: borderline leftward Intervals: WNL PVCs No acute ischemic changes No STEMI    Radiology  XR hip: IMPRESSION: 1. No definite displaced fracture  or dislocation involving the right femur. 2. Subtle irregularity of the right inferior pubic ramus may represent a nondisplaced fracture.  CT: IMPRESSION: 1. Acute fractures of the right acetabulum and right inferior pubic ramus as detailed above. 2. Small right-sided pelvic hematoma. 3. Diffuse bladder wall thickening with at least one bladder diverticulum. Findings are favored to be secondary to chronic outlet obstruction. The prostate gland is significantly enlarged.   Procedures  Procedure(s) performed  (including critical care):  Procedures   Initial Impression / Assessment and Plan / ED Course  78 y.o. male who presents to the ED for right hip pain after a fall yesterday.  Ddx: fracture, contusion  Plan: basic labs to evaluate for any etiology/precipitating factor for the fall, imaging  Work-up reveals acute fractures of the right acetabulum and right inferior pubic rami fracture.  We will plan for admission for pain control, PT/OT.  Orthopedics aware.  Other lab work notable for hypokalemia, will replete with PO.  Macrocytic anemia, likely in the setting of his alcohol use, given thiamine, folate.  Awaiting urine.  Discussed with hospitalist for admission.   Final Clinical Impression(s) / ED Diagnosis  Final diagnoses:  Fall, initial encounter  Right hip pain  Closed fracture of right hip, initial encounter [facility](HCC)       Note:  This document was prepared using Dragon voice recognition software and may include unintentional dictation errors.     Miguel AschoffMonks,  L., MD 07/29/19 2129

## 2019-07-29 NOTE — ED Notes (Signed)
Attempted to call report and was told the nurse would have to call me back due to an emergency on the floor

## 2019-07-29 NOTE — ED Notes (Signed)
Attempted to call report to the floor, was told the nurse would have to call me back

## 2019-07-30 DIAGNOSIS — M549 Dorsalgia, unspecified: Secondary | ICD-10-CM | POA: Diagnosis present

## 2019-07-30 DIAGNOSIS — E785 Hyperlipidemia, unspecified: Secondary | ICD-10-CM | POA: Diagnosis present

## 2019-07-30 DIAGNOSIS — S32401A Unspecified fracture of right acetabulum, initial encounter for closed fracture: Secondary | ICD-10-CM | POA: Diagnosis present

## 2019-07-30 DIAGNOSIS — E878 Other disorders of electrolyte and fluid balance, not elsewhere classified: Secondary | ICD-10-CM | POA: Diagnosis present

## 2019-07-30 DIAGNOSIS — K219 Gastro-esophageal reflux disease without esophagitis: Secondary | ICD-10-CM | POA: Diagnosis present

## 2019-07-30 DIAGNOSIS — B9620 Unspecified Escherichia coli [E. coli] as the cause of diseases classified elsewhere: Secondary | ICD-10-CM | POA: Diagnosis present

## 2019-07-30 DIAGNOSIS — I10 Essential (primary) hypertension: Secondary | ICD-10-CM | POA: Diagnosis present

## 2019-07-30 DIAGNOSIS — Z23 Encounter for immunization: Secondary | ICD-10-CM | POA: Diagnosis present

## 2019-07-30 DIAGNOSIS — W1830XA Fall on same level, unspecified, initial encounter: Secondary | ICD-10-CM | POA: Diagnosis present

## 2019-07-30 DIAGNOSIS — E876 Hypokalemia: Secondary | ICD-10-CM | POA: Diagnosis present

## 2019-07-30 DIAGNOSIS — S32591A Other specified fracture of right pubis, initial encounter for closed fracture: Secondary | ICD-10-CM | POA: Diagnosis present

## 2019-07-30 DIAGNOSIS — F17200 Nicotine dependence, unspecified, uncomplicated: Secondary | ICD-10-CM | POA: Diagnosis present

## 2019-07-30 DIAGNOSIS — Z20828 Contact with and (suspected) exposure to other viral communicable diseases: Secondary | ICD-10-CM | POA: Diagnosis present

## 2019-07-30 DIAGNOSIS — F1010 Alcohol abuse, uncomplicated: Secondary | ICD-10-CM | POA: Diagnosis present

## 2019-07-30 DIAGNOSIS — Y92019 Unspecified place in single-family (private) house as the place of occurrence of the external cause: Secondary | ICD-10-CM | POA: Diagnosis not present

## 2019-07-30 DIAGNOSIS — F329 Major depressive disorder, single episode, unspecified: Secondary | ICD-10-CM | POA: Diagnosis present

## 2019-07-30 DIAGNOSIS — B9689 Other specified bacterial agents as the cause of diseases classified elsewhere: Secondary | ICD-10-CM | POA: Diagnosis present

## 2019-07-30 DIAGNOSIS — N390 Urinary tract infection, site not specified: Secondary | ICD-10-CM | POA: Diagnosis present

## 2019-07-30 DIAGNOSIS — Z823 Family history of stroke: Secondary | ICD-10-CM | POA: Diagnosis not present

## 2019-07-30 LAB — BASIC METABOLIC PANEL
Anion gap: 9 (ref 5–15)
BUN: 12 mg/dL (ref 8–23)
CO2: 25 mmol/L (ref 22–32)
Calcium: 8.2 mg/dL — ABNORMAL LOW (ref 8.9–10.3)
Chloride: 104 mmol/L (ref 98–111)
Creatinine, Ser: 0.75 mg/dL (ref 0.61–1.24)
GFR calc Af Amer: >60 mL/min (ref >60)
GFR calc non Af Amer: >60 mL/min (ref >60)
Glucose, Bld: 90 mg/dL (ref 70–99)
Potassium: 3 mmol/L — ABNORMAL LOW (ref 3.5–5.1)
Sodium: 138 mmol/L (ref 135–145)

## 2019-07-30 LAB — CBC
HCT: 31.7 % — ABNORMAL LOW (ref 39.0–52.0)
Hemoglobin: 11.1 g/dL — ABNORMAL LOW (ref 13.0–17.0)
MCH: 36.9 pg — ABNORMAL HIGH (ref 26.0–34.0)
MCHC: 35 g/dL (ref 30.0–36.0)
MCV: 105.3 fL — ABNORMAL HIGH (ref 80.0–100.0)
Platelets: 262 10*3/uL (ref 150–400)
RBC: 3.01 MIL/uL — ABNORMAL LOW (ref 4.22–5.81)
RDW: 16.6 % — ABNORMAL HIGH (ref 11.5–15.5)
WBC: 10 10*3/uL (ref 4.0–10.5)
nRBC: 0 % (ref 0.0–0.2)

## 2019-07-30 LAB — PHOSPHORUS: Phosphorus: 2.6 mg/dL (ref 2.5–4.6)

## 2019-07-30 LAB — SARS CORONAVIRUS 2 (TAT 6-24 HRS): SARS Coronavirus 2: NEGATIVE

## 2019-07-30 LAB — MAGNESIUM: Magnesium: 0.9 mg/dL — CL (ref 1.7–2.4)

## 2019-07-30 MED ORDER — ADULT MULTIVITAMIN W/MINERALS CH
1.0000 | ORAL_TABLET | Freq: Every day | ORAL | Status: DC
  Administered 2019-07-31 – 2019-08-04 (×5): 1 via ORAL
  Filled 2019-07-30 (×5): qty 1

## 2019-07-30 MED ORDER — MAGNESIUM SULFATE 4 GM/100ML IV SOLN: 4.0000 g | Freq: Once | INTRAVENOUS | Status: DC

## 2019-07-30 MED ORDER — SODIUM CHLORIDE 0.9 % IV SOLN
1.0000 g | INTRAVENOUS | Status: DC
  Administered 2019-07-30 – 2019-07-31 (×2): 1 g via INTRAVENOUS
  Filled 2019-07-30: qty 1
  Filled 2019-07-30 (×2): qty 10

## 2019-07-30 MED ORDER — VITAMIN B-1 100 MG PO TABS
100.0000 mg | ORAL_TABLET | Freq: Every day | ORAL | Status: DC
  Administered 2019-07-31 – 2019-08-04 (×5): 100 mg via ORAL
  Filled 2019-07-30 (×5): qty 1

## 2019-07-30 MED ORDER — TRAZODONE HCL 100 MG PO TABS: 200.0000 mg | ORAL_TABLET | Freq: Every evening | ORAL | Status: DC | PRN

## 2019-07-30 MED ORDER — SODIUM CHLORIDE 0.9 % IV SOLN
1.0000 g | INTRAVENOUS | Status: DC
  Filled 2019-07-30: qty 10

## 2019-07-30 MED ORDER — ESCITALOPRAM OXALATE 10 MG PO TABS
10.0000 mg | ORAL_TABLET | Freq: Every day | ORAL | Status: DC
  Administered 2019-07-30 – 2019-08-04 (×6): 10 mg via ORAL
  Filled 2019-07-30 (×8): qty 1

## 2019-07-30 MED ORDER — ENSURE ENLIVE PO LIQD
237.0000 mL | Freq: Two times a day (BID) | ORAL | Status: DC
  Administered 2019-07-30 – 2019-08-04 (×8): 237 mL via ORAL

## 2019-07-30 MED ORDER — POTASSIUM CHLORIDE CRYS ER 20 MEQ PO TBCR
40.0000 meq | EXTENDED_RELEASE_TABLET | Freq: Two times a day (BID) | ORAL | Status: AC
  Administered 2019-07-30 (×2): 40 meq via ORAL
  Filled 2019-07-30 (×2): qty 2

## 2019-07-30 MED ORDER — MAGNESIUM SULFATE 4 GM/100ML IV SOLN
4.0000 g | Freq: Two times a day (BID) | INTRAVENOUS | Status: AC
  Administered 2019-07-30 (×2): 4 g via INTRAVENOUS
  Filled 2019-07-30 (×2): qty 100

## 2019-07-30 MED ORDER — ATORVASTATIN CALCIUM 20 MG PO TABS
20.0000 mg | ORAL_TABLET | Freq: Every day | ORAL | Status: DC
  Administered 2019-07-30 – 2019-08-03 (×5): 20 mg via ORAL
  Filled 2019-07-30 (×5): qty 1

## 2019-07-30 MED ORDER — FOLIC ACID 1 MG PO TABS
1.0000 mg | ORAL_TABLET | Freq: Every day | ORAL | Status: DC
  Administered 2019-07-31 – 2019-08-04 (×5): 1 mg via ORAL
  Filled 2019-07-30 (×5): qty 1

## 2019-07-30 NOTE — Consult Note (Signed)
PHARMACY CONSULT NOTE - FOLLOW UP  Pharmacy Consult for Electrolyte Monitoring and Replacement   HPI: 78 y/o admitted for right pubic ramus and acetabular fracture after fall. Patient found to have UTI with reported hesitancy on ceftriaxone; cultures pending.   Recent Labs: Potassium (mmol/L)  Date Value  07/30/2019 3.0 (L)  10/25/2014 3.3 (L)   Magnesium (mg/dL)  Date Value  06/07/2014 2.1   Calcium (mg/dL)  Date Value  07/30/2019 8.2 (L)   Calcium, Total (mg/dL)  Date Value  10/25/2014 8.9   Albumin (g/dL)  Date Value  10/25/2014 3.7   Sodium (mmol/L)  Date Value  07/30/2019 138  10/25/2014 133 (L)     Assessment: -Hypokalemia but improved from yesterday after 40 mEq oral potassium. Patient takes 20 mEq oral potassium daily at home. -Profound hypomagnesemia  Goal of Therapy:  Replete electrolytes to normal levels  Plan:  -Give 40 mEq oral potassium x2 -Give 4g magnesium sulfate IV x2 -Will follow-up electrolytes on AM labs tomorrow and replete as needed.  Thank you   Benita Gutter  07/30/2019 8:13 AM

## 2019-07-30 NOTE — Progress Notes (Addendum)
Initial Nutrition Assessment  DOCUMENTATION CODES:   Not applicable  INTERVENTION:   Ensure Enlive po BID, each supplement provides 350 kcal and 20 grams of protein  MVI, thiamine and folic acid in setting of etoh abuse  Recommend oscal w/ D po BID   Pt at refeed risk; recommend monitor K, Mg and P labs daily until stable.   NUTRITION DIAGNOSIS:   Increased nutrient needs related to post-op healing as evidenced by increased estimated needs.  GOAL:   Patient will meet greater than or equal to 90% of their needs  MONITOR:   PO intake, Supplement acceptance, Labs, Weight trends, Skin, I & O's  REASON FOR ASSESSMENT:   Malnutrition Screening Tool    ASSESSMENT:   78 y/o male with h/o etoh abuse admitted with hip fracture s/p fall  RD working remotely.  Suspect pt with poor appetite and oral intake at baseline r/t etoh abuse. RD will add supplements and vitamins to help pt meet his estimated needs. Per chart, pt down 20lbs(13%) over the past year; this is not significant.   Pt at high risk for malnutrition but unable to diagnose at this time as NFPE cannot be performed.   Medications reviewed and include: lovenox, protonix, KCl, ceftriaxone   Labs reviewed: K 3.0(L), P 2.6 wnl Hgb 11.1(L), Hct 31.7(L), MCV 105.3(H), MCH 36.9(H)  Unable to complete Nutrition-Focused physical exam at this time.   Diet Order:   Diet Order            Diet regular Room service appropriate? Yes; Fluid consistency: Thin  Diet effective now             EDUCATION NEEDS:   Education needs have been addressed  Skin:  Skin Assessment: Reviewed RN Assessment  Last BM:  9/4- type 6  Height:   Ht Readings from Last 1 Encounters:  07/29/19 5' 4.5" (1.638 m)    Weight:   Wt Readings from Last 1 Encounters:  07/29/19 60.1 kg    Ideal Body Weight:  59 kg  BMI:  Body mass index is 22.39 kg/m.  Estimated Nutritional Needs:   Kcal:  1600-1800kcal/day  Protein:   80-90g/day  Fluid:  >1.5L/day  Koleen Distance MS, RD, LDN Pager #- [phone] Office#- [phone] After Hours Pager: [phone]

## 2019-07-30 NOTE — Evaluation (Signed)
Occupational Therapy Evaluation Patient Details Name: Richard Carter MRN: [id number] DOB: [birth year] Today's Date: 07/30/2019    History of Present Illness 78 y/o male s/p fall at home, c/o R hip pain.   Imaging reveals right pubic ramus and acetabular fractures.  He was also found to have a UTI,   Clinical Impression   Richard Carter was seen for OT evaluation this date. Pt was generally independent in BADLs prior to surgery, however his neighbor assists him regularly with IADL mgt including medication mgt, grocery shopping, and housework. Pt reports ambulating limited distances using a SPC, and driving. He endorses at least 2 falls in the past 6 months. He states during each fall his R hip "just gave out on me" once while getting out of a car (non-injurious) and once while walking. Pt is eager to return to PLOF with less pain and improved safety and independence. Pt currently requires max assist for LB dressing and bathing while in seated position due to increased pain and limited AROM of R hip and pelvis. Pt instructed in self care skills, falls prevention strategies, home/routines modifications, DME/AE for LB bathing and dressing tasks, and compression stocking mgt strategies. Pt would benefit from additional instruction in self care skills and techniques to help maintain precautions with or without assistive devices to support recall and carryover prior to discharge. Recommend STR upon discharge.      Follow Up Recommendations  SNF;Supervision/Assistance - 24 hour    Equipment Recommendations  3 in 1 bedside commode    Recommendations for Other Services       Precautions / Restrictions Precautions Precautions: Fall Restrictions Weight Bearing Restrictions: Yes RLE Weight Bearing: Touchdown weight bearing      Mobility Bed Mobility Overal bed mobility: Needs Assistance Bed Mobility: Supine to Sit;Sit to Supine     Supine to sit: Min assist Sit to supine: Mod assist   General  bed mobility comments: Pt able to use BUE/bedrails to reposition himself in bed. Pt declined further bed mobility 2/2 pain. Per PT note pt requires min-mod assist for sup<>sit on this date.  Transfers Overall transfer level: Needs assistance Equipment used: Rolling walker (2 wheeled) Transfers: Sit to/from Stand Sit to Stand: Mod assist              Balance Overall balance assessment: Needs assistance Sitting-balance support: Bilateral upper extremity supported Sitting balance-Leahy Scale: Fair Sitting balance - Comments: PT able to tolerate having his bed placed into chair mode for UE assessment and OT education. Was able to utilize abdominal muscles to sit self upright for a brief time, but generally required back support. Pt fatigued after ~10 min sitting in this position and requested to be return to semi-supine.   Standing balance support: Bilateral upper extremity supported Standing balance-Leahy Scale: Poor Standing balance comment: Deferred. Per chart, pt able to briefly maintain stating standing at EOB with PT on this date. Limited activity tolerance.                           ADL either performed or assessed with clinical judgement   ADL                                         General ADL Comments: Pt very limited by pain during evaluation. Currently max assist at bed-level for toileting and LB self-care.  Can do UB ADL and grooming tasks with set-up asssit. Limited attempts at functional mobility on this date. Pt requires +2 assist for functional mobility.     Vision Baseline Vision/History: Wears glasses Wears Glasses: At all times Patient Visual Report: No change from baseline       Perception     Praxis      Pertinent Vitals/Pain Pain Assessment: 0-10 Pain Score: 5  Pain Location: pain increases significantly with R LE movement and with mobility Pain Descriptors / Indicators: Aching;Sore;Guarding;Grimacing Pain Intervention(s):  Limited activity within patient's tolerance;Monitored during session;Repositioned     Hand Dominance Right   Extremity/Trunk Assessment Upper Extremity Assessment Upper Extremity Assessment: Overall WFL for tasks assessed   Lower Extremity Assessment Lower Extremity Assessment: Generalized weakness;RLE deficits/detail;Defer to PT evaluation RLE Deficits / Details: Pt very limited AROM of RLE 2/2 increased pain with movement. RLE: Unable to fully assess due to pain RLE Coordination: decreased gross motor;decreased fine motor   Cervical / Trunk Assessment Cervical / Trunk Assessment: Normal   Communication Communication Communication: No difficulties   Cognition Arousal/Alertness: Awake/alert Behavior During Therapy: WFL for tasks assessed/performed Overall Cognitive Status: Within Functional Limits for tasks assessed                                     General Comments       Exercises Other Exercises Other Exercises: Pt educated in falls prevention strategies, safe use of AE for LB ADL mgt, compression stocking mgt, and routines modifications to maximize safety and functional independence upon hospital DC.   Shoulder Instructions      Home Living Family/patient expects to be discharged to:: Private residence Living Arrangements: Alone Available Help at Discharge: Neighbor(Neighbor helps 7 days/week; 8 hours/day) Type of Home: House Home Access: Stairs to enter [employer] of Steps: 2   Home Layout: One level     Bathroom Shower/Tub: Walk-in shower;Door   Bathroom Toilet: Handicapped height     Home Equipment: Environmental consultant - 2 wheels;Cane - single point          Prior Functioning/Environment Level of Independence: Needs assistance  Gait / Transfers Assistance Needed: Pt able to do limited in home ambulation w/o AD, essentially never out of the home ADL's / Homemaking Assistance Needed: neighbor helps with cooking, cleaning, yard work,  [employer], medication mgt, etc.            OT Problem List: Decreased strength;Decreased coordination;Pain;Decreased range of motion;Decreased activity tolerance;Decreased safety awareness;Decreased knowledge of use of DME or AE;Impaired balance (sitting and/or standing);Decreased knowledge of precautions      OT Treatment/Interventions: Self-care/ADL training;Balance training;Therapeutic exercise;Therapeutic activities;DME and/or AE instruction;Patient/family education    OT Goals(Current goals can be found in the care plan section) Acute Rehab OT Goals Patient Stated Goal: Pt very much wants to go home, but knows this would be very difficult OT Goal Formulation: With patient Time For Goal Achievement: 08/13/19 Potential to Achieve Goals: Good  OT Frequency: Min 2X/week   Barriers to D/C: Inaccessible home environment;Decreased caregiver support          Co-evaluation              AM-PAC OT "6 Clicks" Daily Activity     Outcome Measure Help from another person eating meals?: None Help from another person taking care of personal grooming?: A Little Help from another person toileting, which includes using toliet, bedpan, or  urinal?: Total Help from another person bathing (including washing, rinsing, drying)?: Total Help from another person to put on and taking off regular upper body clothing?: A Little Help from another person to put on and taking off regular lower body clothing?: A Lot 6 Click Score: 14   End of Session    Activity Tolerance: Patient limited by pain Patient left: in bed;with call bell/phone within reach;with bed alarm set;with SCD's reapplied  OT Visit Diagnosis: Other abnormalities of gait and mobility (R26.89);Muscle weakness (generalized) (M62.81);Pain Pain - Right/Left: Right(Both) Pain - part of body: Hip(Pelvis)                Time: [id number] OT Time Calculation (min): 25 min Charges:  OT General Charges $OT Visit: 1 Visit OT  Evaluation $OT Eval Low Complexity: 1 Low OT Treatments $Self Care/Home Management : 8-22 mins  Rockney GheeSerenity Brooke Steinhilber, M.S., OTR/L Ascom: [phone] 07/30/19, 3:58 PM

## 2019-07-30 NOTE — Consult Note (Signed)
X-rays and CT reviewed. I do not recommend any surgical intervention. Patient to be TDWB on the right. Full consult note to follow. Please call with questions.

## 2019-07-30 NOTE — Progress Notes (Signed)
[employer] [facility] - [place] [facility]   PATIENT NAME: Richard CowerLarry Carter    MR#:  [id number]  DATE OF BIRTH:  12/22/1940  SUBJECTIVE:  Complaining of right hip pain  REVIEW OF SYSTEMS:    Review of Systems  Constitutional: Negative for fever, chills weight loss HENT: Negative for ear pain, nosebleeds, congestion, facial swelling, rhinorrhea, neck pain, neck stiffness and ear discharge.   Respiratory: Negative for cough, shortness of breath, wheezing  Cardiovascular: Negative for chest pain, palpitations and leg swelling.  Gastrointestinal: Negative for heartburn, abdominal pain, vomiting, diarrhea or consitpation Genitourinary: Negative for dysuria, urgency, frequency, hematuria Musculoskeletal: Negative for back pain or+ RIGHT HIP PAIN  Neurological: Negative for dizziness, seizures, syncope, focal weakness,  numbness and headaches.  Hematological: Does not bruise/bleed easily.  Psychiatric/Behavioral: Negative for hallucinations, confusion, dysphoric mood    Tolerating Diet: yes      DRUG ALLERGIES:  No Known Allergies  VITALS:  Blood pressure 126/80, pulse 76, temperature 98.8 F (37.1 C), temperature source Oral, resp. rate (!) 21, height 5' 4.5" (1.638 m), weight 60.1 kg, SpO2 94 %.  PHYSICAL EXAMINATION:  Constitutional: Appears well-developed and well-nourished. No distress. HENT: Normocephalic. Marland Kitchen. Oropharynx is clear and moist.  Eyes: Conjunctivae and EOM are normal. PERRLA, no scleral icterus.  Neck: Normal ROM. Neck supple. No JVD. No tracheal deviation. CVS: RRR, S1/S2 +, no murmurs, no gallops, no carotid bruit.  Pulmonary: Effort and breath sounds normal, no stridor, rhonchi, wheezes, rales.  Abdominal: Soft. BS +,  no distension, tenderness, rebound or guarding.  Musculoskeletal: right hip pain no bruising pain with movement. No edema and no tenderness.  Neuro: Alert. CN 2-12 grossly intact. No focal deficits. Skin: Skin is warm and dry. No rash  noted. Psychiatric: Normal mood and affect.      LABORATORY PANEL:   CBC Recent Labs  Lab 07/30/19 0327  WBC 10.0  HGB 11.1*  HCT 31.7*  PLT 262   ------------------------------------------------------------------------------------------------------------------  Chemistries  Recent Labs  Lab 07/30/19 0327  NA 138  K 3.0*  CL 104  CO2 25  GLUCOSE 90  BUN 12  CREATININE 0.75  CALCIUM 8.2*   ------------------------------------------------------------------------------------------------------------------  Cardiac Enzymes No results for input(s): TROPONINI in the last 168 hours. ------------------------------------------------------------------------------------------------------------------  RADIOLOGY:  Ct Pelvis Wo Contrast  Result Date: 07/29/2019 CLINICAL DATA:  Hip pain EXAM: CT PELVIS WITHOUT CONTRAST TECHNIQUE: Multidetector CT imaging of the pelvis was performed following the standard protocol without intravenous contrast. COMPARISON:  CT dated September 28, 2013.  This is FINDINGS: Urinary Tract: There is diffuse bladder wall thickening. There appears to be at least 1 bladder diverticulum containing a small calcification. There is asymmetric enlargement of the left seminal vesicle, which currently measures approximately 1.8 x 2.9 cm. This is somewhat similar to prior CT in 2014 and therefore likely represents a benign etiology. Bowel:  Unremarkable visualized pelvic bowel loops. Vascular/Lymphatic: There are advanced atherosclerotic changes involving the partially visualized lower abdominal aorta. Reproductive:  The prostate gland is significantly enlarged. Other:  None Musculoskeletal: There is an acute mildly displaced fracture of the right inferior pubic ramus. There are acute nondisplaced fractures through the anterior column of the right acetabulum extending into the roof. There is a small pelvic wall hematoma. There are advanced degenerative changes of the partially  visualized lower lumbar spine, severe at the L4-L5 and L3-L4 levels. IMPRESSION: 1. Acute fractures of the right acetabulum and right inferior pubic ramus as detailed above. 2. Small right-sided pelvic hematoma.  3. Diffuse bladder wall thickening with at least one bladder diverticulum. Findings are favored to be secondary to chronic outlet obstruction. The prostate gland is significantly enlarged. Aortic Atherosclerosis (ICD10-I70.0). Electronically Signed   By: Constance Holster M.D.   On: 07/29/2019 20:38   Dg Chest Port 1 View  Result Date: 07/29/2019 CLINICAL DATA:  Fall yesterday. Right hip fracture. EXAM: PORTABLE CHEST 1 VIEW COMPARISON:  Radiograph 06/07/2014 FINDINGS: The cardiomediastinal contours are normal. The lungs are clear. Pulmonary vasculature is normal. No consolidation, pleural effusion, or pneumothorax. Multiple left greater than right rib fractures. Fracture of left lateral second rib is age indeterminate. IMPRESSION: Age-indeterminate left lateral second rib fracture, given presence of multiple remote left rib fractures this is likely not acute, however recommend correlation for chest tenderness. No other acute finding. Electronically Signed   By: Keith Rake M.D.   On: 07/29/2019 20:58   Dg Hip Unilat With Pelvis 2-3 Views Right  Result Date: 07/29/2019 CLINICAL DATA:  Fall with hip pain. EXAM: DG HIP (WITH OR WITHOUT PELVIS) 2-3V RIGHT COMPARISON:  None. FINDINGS: There is mild-to-moderate osteoarthritis involving both hips. Phleboliths project over the patient's pelvis. There is no definite displaced fracture or dislocation involving the right femur. There is a subtle irregularity of the right inferior pubic ramus. IMPRESSION: 1. No definite displaced fracture or dislocation involving the right femur. 2. Subtle irregularity of the right inferior pubic ramus may represent a nondisplaced fracture. Electronically Signed   By: Constance Holster M.D.   On: 07/29/2019 19:54      ASSESSMENT AND PLAN:     78 year old male with tobacco dependence and hyperlipidemia who presented to the emergency room complaining of right hip pain after fall.  1.  Acute fractures of the right acetabulum and right inferior pubic ramus:  Non operable   Continue supportive management with pain control and PT consultation.   2.  UTI: Continue Rocephin  3.  Hyperlipidemia: Continue Lipitor  4.  Hypokalemia: Replete and recheck in a.m.  5.  Depression: Continue Lexapro  6. Tobacco dependence: Patient is encouraged to quit smoking and willing to attempt to quit was assessed. Patient highly motivated.Counseling was provided for 4 minutes.  Management plans discussed with the patient and he is in agreement.  CODE STATUS: full  TOTAL TIME TAKING CARE OF THIS PATIENT: 30 minutes.   PT consult for discharge planning  POSSIBLE D/C tomorrow, DEPENDING ON CLINICAL CONDITION.   Bettey Costa M.D on 07/30/2019 at 10:42 AM  Between 7am to 6pm - Pager - [phone] After 6pm go to [email or web address] - password [email or web address] [facility]  Office  [phone]  CC: Primary care physician; Dion Body, MD  Note: This dictation was prepared with Dragon dictation along with smaller phrase technology. Any transcriptional errors that result from this process are unintentional.

## 2019-07-30 NOTE — Evaluation (Signed)
Physical Therapy Evaluation Patient Details Name: Richard Carter MRN: [id number] DOB: [birth year] Today's Date: 07/30/2019   History of Present Illness  78 y/o male s/p fall at home, c/o R hip pain.   Imaging reveals right pubic ramus and acetabular fractures.  He was also found to have a UTI,  Clinical Impression  Pt showed good effort despite pain and considerable change from baseline regarding mobility, etc.  He was able to participate with ~10 minutes of (mostly AAROM) exercises with R>L LEs.  Pt was also able to participate with 10-15 minutes of mobility tasks (and education about WBing, expected course of treatment).  Pt actually was able to manage in-bed mobility better than expected though still having a lot of pain. However with standing attempts he was very limited and despite heavy verbal cuing and physical assist to [facility] he was unable to manage even a single steps to get toward the recliner.  Pt is not safe to return home and will require STR he get back to a place where he is able to manage at home.    Follow Up Recommendations SNF(Pt not wanting to go to SNF, but knows he's not safe at home)    Equipment Recommendations  3in1 (PT);Wheelchair (measurements PT)(w/c per progress)    Recommendations for Other Services       Precautions / Restrictions Precautions Precautions: Fall Restrictions Weight Bearing Restrictions: Yes RLE Weight Bearing: Touchdown weight bearing      Mobility  Bed Mobility Overal bed mobility: Needs Assistance Bed Mobility: Supine to Sit;Sit to Supine     Supine to sit: Min assist Sit to supine: Mod assist   General bed mobility comments: Pt did surprisingly well with getting to EOB, he did need hand to hold and extra time but was able to assume EOB sitting w/o excrutiating pain (still clearly very uncomfortable) and maintain sitting balance w/o physical assist.  Pt needed considerably more assist to get back into bed as he was completely unable  to lift R LE back into bed and was in considerable pain after attempts at stepping.  Transfers Overall transfer level: Needs assistance Equipment used: Rolling walker (2 wheeled) Transfers: Sit to/from Stand Sit to Stand: Mod assist         General transfer comment: Pt is able to initiate movement into standing, despite heavy cuing appeared to put some weight through R hip during transition.  Did 3x sit to stand with varying degrees of assist to insure WBing compliance (and adjusting walker to lower than standard to see if he could utilize UEs better).  Pt with good effort t/o all attempts, but ultimately very weak and functionally limited.   Ambulation/Gait             General Gait Details: Unable to take even a single step despite 2 seperate standing attempts.  Pt was unable to get weight off R foot (and into UEs/walker).  Pt managed to ever so slightly move R foot ~1" toward recliner but even with heavy cuing and assist to unweight could not even heel-toe L foot to try any mobility toward recliner.   Stairs            Wheelchair Mobility    Modified Rankin (Stroke Patients Only)       Balance Overall balance assessment: Needs assistance Sitting-balance support: Bilateral upper extremity supported Sitting balance-Leahy Scale: Fair Sitting balance - Comments: Pt clearly uncomforatble in sitting EOB, but not to the point of being unable to  tolerate sitting, uses UEs to help unweight - no LOBs   Standing balance support: Bilateral upper extremity supported Standing balance-Leahy Scale: Poor Standing balance comment: Pt able to briefly maintain static standing in walker near EOB.  Poor time frame tolerance                             Pertinent Vitals/Pain Pain Assessment: 0-10 Pain Score: 4  Pain Location: pain increases significantly with R LE movement and with mobility    Home Living Family/patient expects to be discharged to:: Private  residence Living Arrangements: Alone Available Help at Discharge: Neighbor(neighbor helps 7d/wk, ~8 hrs/day) Type of Home: House Home Access: Stairs to enter Entrance Stairs-Rails: (yes) Entrance Stairs-Number of Steps: 2 Home Layout: One level Home Equipment: Walker - 2 wheels      Prior Function Level of Independence: Needs assistance   Gait / Transfers Assistance Needed: Pt able to do limited in home ambulation w/o AD, essentially never out of the home  ADL's / Homemaking Assistance Needed: neighbor helps with cooking, cleaning, yard work, errands, Archivist        Extremity/Trunk Assessment   Upper Extremity Assessment Upper Extremity Assessment: Generalized weakness;Overall [facility] for tasks assessed(tricep strength appears enough to be able to use walker )    Lower Extremity Assessment Lower Extremity Assessment: Generalized weakness;RLE deficits/detail(L LE grossly 4/5, WFL except some R pain with L mvt) RLE Deficits / Details: Pt was able to do some limited and very guarded AROM on the R, but ultimately needed assist and extra time (secondary to pain) t/o testing and exercises.  Grossly 2+/5 in hip, 3/5 at knee, 4/5 at ankle.  No "WBing" resisted tasks        Communication   Communication: No difficulties  Cognition Arousal/Alertness: Awake/alert Behavior During Therapy: WFL for tasks assessed/performed Overall Cognitive Status: Within Functional Limits for tasks assessed                                 General Comments: Pt with some lethargy, neighbor present and reports that he thinks the meds have got him "a little off"      General Comments      Exercises General Exercises - Lower Extremity Ankle Circles/Pumps: AROM;10 reps Short Arc Quad: AROM;AAROM;10 reps(pt needed rest breaks between minimal reps) Heel Slides: AAROM;10 reps(no WBing through LE but needed AAROM guidance ) Hip ABduction/ADduction: AAROM;10 reps Straight  Leg Raises: PROM;5 reps(unable to tolerate )   Assessment/Plan    PT Assessment Patient needs continued PT services  PT Problem List Decreased strength;Decreased range of motion;Decreased activity tolerance;Decreased balance;Decreased mobility;Decreased coordination;Decreased cognition;Decreased safety awareness;Decreased knowledge of use of DME;Pain       PT Treatment Interventions Stair training;Functional mobility training;Gait training;DME instruction;Therapeutic activities;Therapeutic exercise;Balance training;Patient/family education;Neuromuscular re-education    PT Goals (Current goals can be found in the Care Plan section)  Acute Rehab PT Goals Patient Stated Goal: Pt very much wants to go home, but knows this would be very difficult PT Goal Formulation: With patient Time For Goal Achievement: 08/13/19 Potential to Achieve Goals: Fair    Frequency 7X/week   Barriers to discharge        Co-evaluation               AM-PAC PT "6 Clicks" Mobility  Outcome Measure Help needed  turning from your back to your side while in a flat bed without using bedrails?: A Little Help needed moving from lying on your back to sitting on the side of a flat bed without using bedrails?: A Lot Help needed moving to and from a bed to a chair (including a wheelchair)?: Total Help needed standing up from a chair using your arms (e.g., wheelchair or bedside chair)?: A Lot Help needed to walk in hospital room?: Total Help needed climbing 3-5 steps with a railing? : Total 6 Click Score: 10    End of Session Equipment Utilized During Treatment: Gait belt Activity Tolerance: Patient limited by pain Patient left: with bed alarm set;with call bell/phone within reach Nurse Communication: Mobility status PT Visit Diagnosis: Muscle weakness (generalized) (M62.81);Difficulty in walking, not elsewhere classified (R26.2)    Time: [id number] PT Time Calculation (min) (ACUTE ONLY): 44 min   Charges:    PT Evaluation $PT Eval Low Complexity: 1 Low PT Treatments $Therapeutic Exercise: 8-22 mins $Therapeutic Activity: 8-22 mins        Malachi ProGalen R Taylyn Brame, DPT 07/30/2019, 12:07 PM

## 2019-07-30 NOTE — Progress Notes (Signed)
   07/30/19 1400  Clinical Encounter Type  Visited With Patient;Health care provider  Visit Type Initial  Referral From Nurse  Consult/Referral To Chaplain  Stress Factors  Patient Stress Factors Loss   Chaplain received a referral from the patient's nurse. Upon arrival, the patient acknowledged that he was sleeping. This chaplain will return at a later time.

## 2019-07-31 LAB — BASIC METABOLIC PANEL
Anion gap: 10 (ref 5–15)
BUN: 17 mg/dL (ref 8–23)
CO2: 23 mmol/L (ref 22–32)
Calcium: 8.4 mg/dL — ABNORMAL LOW (ref 8.9–10.3)
Chloride: 101 mmol/L (ref 98–111)
Creatinine, Ser: 0.87 mg/dL (ref 0.61–1.24)
GFR calc Af Amer: >60 mL/min (ref >60)
GFR calc non Af Amer: >60 mL/min (ref >60)
Glucose, Bld: 93 mg/dL (ref 70–99)
Potassium: 3.9 mmol/L (ref 3.5–5.1)
Sodium: 134 mmol/L — ABNORMAL LOW (ref 135–145)

## 2019-07-31 LAB — CBC
HCT: 31.7 % — ABNORMAL LOW (ref 39.0–52.0)
Hemoglobin: 11 g/dL — ABNORMAL LOW (ref 13.0–17.0)
MCH: 37 pg — ABNORMAL HIGH (ref 26.0–34.0)
MCHC: 34.7 g/dL (ref 30.0–36.0)
MCV: 106.7 fL — ABNORMAL HIGH (ref 80.0–100.0)
Platelets: 235 10*3/uL (ref 150–400)
RBC: 2.97 MIL/uL — ABNORMAL LOW (ref 4.22–5.81)
RDW: 16.5 % — ABNORMAL HIGH (ref 11.5–15.5)
WBC: 11.5 10*3/uL — ABNORMAL HIGH (ref 4.0–10.5)
nRBC: 0 % (ref 0.0–0.2)

## 2019-07-31 LAB — URINE CULTURE: Culture: >=100000 — AB

## 2019-07-31 LAB — PHOSPHORUS: Phosphorus: 2.1 mg/dL — ABNORMAL LOW (ref 2.5–4.6)

## 2019-07-31 LAB — MAGNESIUM: Magnesium: 2.5 mg/dL — ABNORMAL HIGH (ref 1.7–2.4)

## 2019-07-31 MED ORDER — POTASSIUM & SODIUM PHOSPHATES 280-160-250 MG PO PACK
1.0000 | PACK | Freq: Three times a day (TID) | ORAL | Status: AC
  Administered 2019-07-31 (×3): 1 via ORAL
  Filled 2019-07-31 (×3): qty 1

## 2019-07-31 NOTE — Progress Notes (Signed)
Physical Therapy Treatment Patient Details Name: Richard CashLarry L Rengel MRN: [id number] DOB: 03/02/1941 Today's Date: 07/31/2019    History of Present Illness 78 y/o male s/p fall at home, c/o R hip pain.   Imaging reveals right pubic ramus and acetabular fractures.  He was also found to have a UTI,    PT Comments    Pt agreeable to PT reluctantly; pt notes significant pain today throughout pelvis and into LEs. Pt encouraged to participate in general LE exercises for range and strength and to limit increased pain due to stiffness. Pt given written HEP and encouraged to perform several times a day. Pt declines up/out of bed at this time. Continue PT to progress range, strength and activity tolerance to improve functional mobility.    Follow Up Recommendations  SNF     Equipment Recommendations  3in1 (PT);Wheelchair (measurements PT)    Recommendations for Other Services       Precautions / Restrictions Precautions Precautions: Fall Restrictions Weight Bearing Restrictions: Yes RLE Weight Bearing: Touchdown weight bearing    Mobility  Bed Mobility               General bed mobility comments: not tested; pt states he is in too much pain  Transfers                    Ambulation/Gait                 Stairs             Wheelchair Mobility    Modified Rankin (Stroke Patients Only)       Balance                                            Cognition Arousal/Alertness: Awake/alert Behavior During Therapy: WFL for tasks assessed/performed Overall Cognitive Status: Within Functional Limits for tasks assessed                                        Exercises General Exercises - Lower Extremity Ankle Circles/Pumps: AROM;Both;15 reps Quad Sets: Strengthening;Both;15 reps Gluteal Sets: Strengthening;Both;15 reps Short Arc Quad: AAROM;Both;15 reps Heel Slides: AAROM;Both;15 reps Hip ABduction/ADduction: AAROM;Both;15  reps Straight Leg Raises: PROM;5 reps Other Exercises Other Exercises: Pt given HEP and encouraged to perform those pt able on own 3-4 times per day    General Comments        Pertinent Vitals/Pain Pain Assessment: Faces Faces Pain Scale: Hurts whole lot Pain Descriptors / Indicators: Aching;Constant;Grimacing;Moaning Pain Intervention(s): Limited activity within patient's tolerance    Home Living                      Prior Function            PT Goals (current goals can now be found in the care plan section) Progress towards PT goals: Progressing toward goals(slowly)    Frequency    7X/week      PT Plan Current plan remains appropriate    Co-evaluation              AM-PAC PT "6 Clicks" Mobility   Outcome Measure  Help needed turning from your back to your side while in a flat bed without using bedrails?: A Little Help needed  moving from lying on your back to sitting on the side of a flat bed without using bedrails?: A Lot Help needed moving to and from a bed to a chair (including a wheelchair)?: Total Help needed standing up from a chair using your arms (e.g., wheelchair or bedside chair)?: A Lot Help needed to walk in hospital room?: Total Help needed climbing 3-5 steps with a railing? : Total 6 Click Score: 10    End of Session   Activity Tolerance: Patient limited by pain Patient left: in bed;with call bell/phone within reach;with bed alarm set   PT Visit Diagnosis: Muscle weakness (generalized) (M62.81);Difficulty in walking, not elsewhere classified (R26.2)     Time: 3094-0768 PT Time Calculation (min) (ACUTE ONLY): 24 min  Charges:  $Therapeutic Exercise: 23-37 mins                      Larae Grooms, PTA 07/31/2019, 1:16 PM

## 2019-07-31 NOTE — NC FL2 (Addendum)
[place] MEDICAID FL2 LEVEL OF CARE SCREENING TOOL     IDENTIFICATION  Patient Name: Richard Carter Birthdate: 10/21/1941 Sex: male Admission Date (Current Location): 07/29/2019  [place]   Facility and Address:  [facility], [address], [place], [place] [address]      Provider Number: [id number]  Attending Physician Name and Address:  Adrian SaranMody, Sital, MD  Relative Name and Phone Number:  Lilia ProDon Pflaum [phone]    Current Level of Care: Hospital Recommended Level of Care: Skilled Nursing Facility Prior Approval Number:    Date Approved/Denied:   PASRR Number: [id number]  Discharge Plan: SNF    Current Diagnoses: Patient Active Problem List   Diagnosis Date Noted  . Pubic ramus fracture (HCC) 07/29/2019  . Acetabular fracture (HCC) 07/29/2019  . HLD (hyperlipidemia) 07/29/2019  . GERD (gastroesophageal reflux disease) 07/29/2019  . UTI (urinary tract infection) 07/29/2019    Orientation RESPIRATION BLADDER Height & Weight     Self, Time, Situation, Place  Normal Continent Weight: 60.1 kg Height:  5' 4.5" (163.8 cm)  BEHAVIORAL SYMPTOMS/MOOD NEUROLOGICAL BOWEL NUTRITION STATUS      Continent Diet  AMBULATORY STATUS COMMUNICATION OF NEEDS Skin   Extensive Assist Verbally Normal                       Personal Care Assistance Level of Assistance  Bathing, Feeding, Dressing Bathing Assistance: Limited assistance Feeding assistance: Independent Dressing Assistance: Limited assistance     Functional Limitations Info             SPECIAL CARE FACTORS FREQUENCY  PT (By licensed PT), OT (By licensed OT)     PT Frequency: 5 times per week OT Frequency: 5 times per week            Contractures Contractures Info: Not present    Additional Factors Info  Code Status, Allergies Code Status Info: Full Allergies Info: NKA           Current Medications (07/31/2019):  This is the current  hospital active medication list Current Facility-Administered Medications  Medication Dose Route Frequency Provider Last Rate Last Dose  . acetaminophen (TYLENOL) tablet 650 mg  650 mg Oral Q6H PRN Oralia ManisWillis, David, MD   650 mg at 07/30/19 0945   Or  . acetaminophen (TYLENOL) suppository 650 mg  650 mg Rectal Q6H PRN Oralia ManisWillis, David, MD      . atorvastatin (LIPITOR) tablet 20 mg  20 mg Oral QHS Adrian SaranMody, Sital, MD   20 mg at 07/30/19 2137  . cefTRIAXone (ROCEPHIN) 1 g in sodium chloride 0.9 % 100 mL IVPB  1 g Intravenous Q24H Adrian SaranMody, Sital, MD 200 mL/hr at 07/30/19 2136 1 g at 07/30/19 2136  . enoxaparin (LOVENOX) injection 40 mg  40 mg Subcutaneous QHS Oralia ManisWillis, David, MD   40 mg at 07/30/19 2136  . escitalopram (LEXAPRO) tablet 10 mg  10 mg Oral Daily Adrian SaranMody, Sital, MD   10 mg at 07/31/19 [id number]  . feeding supplement (ENSURE ENLIVE) (ENSURE ENLIVE) liquid 237 mL  237 mL Oral BID BM Adrian SaranMody, Sital, MD   237 mL at 07/31/19 0822  . folic acid (FOLVITE) tablet 1 mg  1 mg Oral Daily Adrian SaranMody, Sital, MD   1 mg at 07/31/19 1221  . morphine 4 MG/ML injection 4 mg  4 mg Intravenous Q4H PRN Oralia ManisWillis, David, MD   4 mg at 07/30/19 2131  . multivitamin with minerals tablet 1  tablet  1 tablet Oral Daily Bettey Costa, MD   1 tablet at 07/31/19 0823  . ondansetron (ZOFRAN) tablet 4 mg  4 mg Oral Q6H PRN Lance Coon, MD       Or  . ondansetron [facility]) injection 4 mg  4 mg Intravenous Q6H PRN Lance Coon, MD      . oxyCODONE (Oxy IR/ROXICODONE) immediate release tablet 5 mg  5 mg Oral Q4H PRN Lance Coon, MD   5 mg at 07/31/19 1002  . pantoprazole (PROTONIX) EC tablet 40 mg  40 mg Oral Daily Lance Coon, MD   40 mg at 07/31/19 8032  . potassium & sodium phosphates (PHOS-NAK) 280-160-250 MG packet 1 packet  1 packet Oral TID WC & HS Pernell Dupre, RPH   1 packet at 07/31/19 1221  . thiamine (VITAMIN B-1) tablet 100 mg  100 mg Oral Daily Bettey Costa, MD   100 mg at 07/31/19 1221  . traZODone (DESYREL) tablet 200 mg  200 mg  Oral QHS PRN Bettey Costa, MD         Discharge Medications: Please see discharge summary for a list of discharge medications.  Relevant Imaging Results:  Relevant Lab Results:   Additional Information SS# [id number]  Shelbie Hutching, RN

## 2019-07-31 NOTE — TOC Initial Note (Signed)
Transition of Care [facility]) - Initial/Assessment Note    Patient Details  Name: Richard Carter MRN: [id number] Date of Birth: [birth year]  Transition of [facility]) CM/SW Contact:    Shelbie Hutching, RN Phone Number: 07/31/2019, 1:03 PM  Clinical Narrative:                 Patient admitted to the hospital for hip fracture after a fall.  PT recommends SNF.  Patient is agreeable to SNF and prefers [facility], bed search initiated.  Patient reports that he is from home and lives alone.  Patient has a son who lives in [place].     Expected Discharge Plan: Skilled Nursing Facility Barriers to Discharge: Continued Medical Work up   Patient Goals and CMS Choice   CMS Medicare.gov Compare Post Acute Care list provided to:: Patient Choice offered to / list presented to : Patient  Expected Discharge Plan and Services Expected Discharge Plan: [place]   Discharge Planning Services: CM Consult Post Acute Care Choice: [place] Living arrangements for the past 2 months: Single Family Home                                      Prior Living Arrangements/Services Living arrangements for the past 2 months: Single Family Home Lives with:: Self Patient language and need for interpreter reviewed:: No Do you feel safe going back to the place where you live?: Yes      Need for Family Participation in Patient Care: Yes (Comment) Care giver support system in place?: Yes (comment)(son)   Criminal Activity/Legal Involvement Pertinent to Current Situation/Hospitalization: No - Comment as needed  Activities of Daily Living Home Assistive Devices/Equipment: Cane (specify quad or straight)(straight) ADL Screening (condition at time of admission) Patient's cognitive ability adequate to safely complete daily activities?: Yes Is the patient deaf or have difficulty hearing?: Yes Does the patient have difficulty seeing, even when wearing glasses/contacts?: No Does  the patient have difficulty concentrating, remembering, or making decisions?: No Patient able to express need for assistance with ADLs?: Yes Does the patient have difficulty dressing or bathing?: No Independently performs ADLs?: Yes (appropriate for developmental age) Does the patient have difficulty walking or climbing stairs?: Yes Weakness of Legs: Both Weakness of Arms/Hands: None  Permission Sought/Granted Permission sought to share information with : Case Manager Permission granted to share information with : Yes, Verbal Permission Granted     Permission granted to share info w AGENCY: SNF  Permission granted to share info w Relationship: Son     Emotional Assessment Appearance:: Appears stated age Attitude/Demeanor/Rapport: Engaged Affect (typically observed): Accepting Orientation: : Oriented to Self, Oriented to Place, Oriented to  Time, Oriented to Situation Alcohol / Substance Use: Not Applicable Psych Involvement: No (comment)  Admission diagnosis:  Right hip pain [M25.551] Fall, initial encounter [W19.XXXA] Closed fracture of right hip, initial encounter [facility]) [S72.001A] Patient Active Problem List   Diagnosis Date Noted  . Pubic ramus fracture ([place]) 07/29/2019  . Acetabular fracture ([place]) 07/29/2019  . HLD (hyperlipidemia) 07/29/2019  . GERD (gastroesophageal reflux disease) 07/29/2019  . UTI (urinary tract infection) 07/29/2019   PCP:  Dion Body, MD Pharmacy:   [facility] ([facility] [place] [place] [address] [place]) [place] [address] Phone: [phone] Fax: [phone]     Social Determinants of Health (SDOH) Interventions  Readmission Risk Interventions No flowsheet data found.

## 2019-07-31 NOTE — Consult Note (Signed)
PHARMACY CONSULT NOTE - FOLLOW UP  Pharmacy Consult for Electrolyte Monitoring and Replacement   HPI: 78 y/o admitted for right pubic ramus and acetabular fracture after fall. Patient found to have UTI with reported hesitancy on ceftriaxone; cultures pending.   Recent Labs: Potassium (mmol/L)  Date Value  07/31/2019 3.9  10/25/2014 3.3 (L)   Magnesium (mg/dL)  Date Value  07/31/2019 2.5 (H)  06/07/2014 2.1   Calcium (mg/dL)  Date Value  07/31/2019 8.4 (L)   Calcium, Total (mg/dL)  Date Value  10/25/2014 8.9   Albumin (g/dL)  Date Value  10/25/2014 3.7   Phosphorus (mg/dL)  Date Value  07/31/2019 2.1 (L)   Sodium (mmol/L)  Date Value  07/31/2019 134 (L)  10/25/2014 133 (L)     Assessment: -Hypokalemia but improved from yesterday after 40 mEq oral potassium. Patient takes 20 mEq oral potassium daily at home. -Profound hypomagnesemia  Goal of Therapy:  Replete electrolytes to normal levels  Plan:  Will order Phos-Nak 1 Packet TID with meals x 3 doses.  -Will follow-up electrolytes on AM labs tomorrow and replete as needed.  Thank you   Pernell Dupre, PharmD, BCPS Clinical Pharmacist 07/31/2019 7:03 AM

## 2019-07-31 NOTE — Progress Notes (Signed)
[employer] - [place] [facility]   PATIENT NAME: Barbara CowerLarry Lanum    MR#:  [id number]  DATE OF BIRTH:  01/07/1941  SUBJECTIVE:  No acute events overnight.  Physical therapy has recommend skilled nursing facility which patient is agreeable.  REVIEW OF SYSTEMS:    Review of Systems  Constitutional: Negative for fever, chills weight loss HENT: Negative for ear pain, nosebleeds, congestion, facial swelling, rhinorrhea, neck pain, neck stiffness and ear discharge.   Respiratory: Negative for cough, shortness of breath, wheezing  Cardiovascular: Negative for chest pain, palpitations and leg swelling.  Gastrointestinal: Negative for heartburn, abdominal pain, vomiting, diarrhea or consitpation Genitourinary: Negative for dysuria, urgency, frequency, hematuria Musculoskeletal: Negative for back pain or+ RIGHT HIP PAIN  Neurological: Negative for dizziness, seizures, syncope, focal weakness,  numbness and headaches.  Hematological: Does not bruise/bleed easily.  Psychiatric/Behavioral: Negative for hallucinations, confusion, dysphoric mood    Tolerating Diet: yes      DRUG ALLERGIES:  No Known Allergies  VITALS:  Blood pressure 138/78, pulse 80, temperature 98.1 F (36.7 C), temperature source Oral, resp. rate 18, height 5' 4.5" (1.638 m), weight 60.1 kg, SpO2 (!) 89 %.  PHYSICAL EXAMINATION:  Constitutional: Appears well-developed and well-nourished. No distress. HENT: Normocephalic. Marland Kitchen. Oropharynx is clear and moist.  Eyes: Conjunctivae and EOM are normal. PERRLA, no scleral icterus.  Neck: Normal ROM. Neck supple. No JVD. No tracheal deviation. CVS: RRR, S1/S2 +, no murmurs, no gallops, no carotid bruit.  Pulmonary: Effort and breath sounds normal, no stridor, rhonchi, wheezes, rales.  Abdominal: Soft. BS +,  no distension, tenderness, rebound or guarding.  Musculoskeletal: right hip pain no bruising pain with movement. No edema and no tenderness.  Neuro:  Alert. CN 2-12 grossly intact. No focal deficits. Skin: Skin is warm and dry. No rash noted. Psychiatric: Normal mood and affect.      LABORATORY PANEL:   CBC Recent Labs  Lab 07/31/19 0544  WBC 11.5*  HGB 11.0*  HCT 31.7*  PLT 235   ------------------------------------------------------------------------------------------------------------------  Chemistries  Recent Labs  Lab 07/31/19 0544  NA 134*  K 3.9  CL 101  CO2 23  GLUCOSE 93  BUN 17  CREATININE 0.87  CALCIUM 8.4*  MG 2.5*   ------------------------------------------------------------------------------------------------------------------  Cardiac Enzymes No results for input(s): TROPONINI in the last 168 hours. ------------------------------------------------------------------------------------------------------------------  RADIOLOGY:  Ct Pelvis Wo Contrast  Result Date: 07/29/2019 CLINICAL DATA:  Hip pain EXAM: CT PELVIS WITHOUT CONTRAST TECHNIQUE: Multidetector CT imaging of the pelvis was performed following the standard protocol without intravenous contrast. COMPARISON:  CT dated September 28, 2013.  This is FINDINGS: Urinary Tract: There is diffuse bladder wall thickening. There appears to be at least 1 bladder diverticulum containing a small calcification. There is asymmetric enlargement of the left seminal vesicle, which currently measures approximately 1.8 x 2.9 cm. This is somewhat similar to prior CT in 2014 and therefore likely represents a benign etiology. Bowel:  Unremarkable visualized pelvic bowel loops. Vascular/Lymphatic: There are advanced atherosclerotic changes involving the partially visualized lower abdominal aorta. Reproductive:  The prostate gland is significantly enlarged. Other:  None Musculoskeletal: There is an acute mildly displaced fracture of the right inferior pubic ramus. There are acute nondisplaced fractures through the anterior column of the right acetabulum extending into the roof.  There is a small pelvic wall hematoma. There are advanced degenerative changes of the partially visualized lower lumbar spine, severe at the L4-L5 and L3-L4 levels. IMPRESSION: 1. Acute fractures of the right  acetabulum and right inferior pubic ramus as detailed above. 2. Small right-sided pelvic hematoma. 3. Diffuse bladder wall thickening with at least one bladder diverticulum. Findings are favored to be secondary to chronic outlet obstruction. The prostate gland is significantly enlarged. Aortic Atherosclerosis (ICD10-I70.0). Electronically Signed   By: Constance Holster M.D.   On: 07/29/2019 20:38   Dg Chest Port 1 View  Result Date: 07/29/2019 CLINICAL DATA:  Fall yesterday. Right hip fracture. EXAM: PORTABLE CHEST 1 VIEW COMPARISON:  Radiograph 06/07/2014 FINDINGS: The cardiomediastinal contours are normal. The lungs are clear. Pulmonary vasculature is normal. No consolidation, pleural effusion, or pneumothorax. Multiple left greater than right rib fractures. Fracture of left lateral second rib is age indeterminate. IMPRESSION: Age-indeterminate left lateral second rib fracture, given presence of multiple remote left rib fractures this is likely not acute, however recommend correlation for chest tenderness. No other acute finding. Electronically Signed   By: Keith Rake M.D.   On: 07/29/2019 20:58   Dg Hip Unilat With Pelvis 2-3 Views Right  Result Date: 07/29/2019 CLINICAL DATA:  Fall with hip pain. EXAM: DG HIP (WITH OR WITHOUT PELVIS) 2-3V RIGHT COMPARISON:  None. FINDINGS: There is mild-to-moderate osteoarthritis involving both hips. Phleboliths project over the patient's pelvis. There is no definite displaced fracture or dislocation involving the right femur. There is a subtle irregularity of the right inferior pubic ramus. IMPRESSION: 1. No definite displaced fracture or dislocation involving the right femur. 2. Subtle irregularity of the right inferior pubic ramus may represent a  nondisplaced fracture. Electronically Signed   By: Constance Holster M.D.   On: 07/29/2019 19:54     ASSESSMENT AND PLAN:     78 year old male with tobacco dependence and hyperlipidemia who presented to the emergency room complaining of right hip pain after fall.  1.  Acute fractures of the right acetabulum and right inferior pubic ramus: Patient has been evaluated by orthopedic surgery.  This is nonoperable.  Continue supportive management with physical therapy and pain control.  Plan for skilled nursing facility once we have approval from insurance company.   2.  Gram-negative rod UTI: Continue Rocephin and follow-up on final urine culture  3.  Hyperlipidemia: Continue Lipitor  4.  Electrolyte abnormalities including phosphorus, potassium and magnesium: Replete as per pharmacy.   5.  Depression: Continue Lexapro  6. Tobacco dependence: Patient is encouraged to quit smoking and willing to attempt to quit was assessed. Patient highly motivated.Counseling was provided.   Management plans discussed with the patient and he is in agreement.  CODE STATUS: full  TOTAL TIME TAKING CARE OF THIS PATIENT: 24 minutes.   Discussed with clinical social worker for discharge planning  POSSIBLE D/C once we have insurance approval   Bettey Costa M.D on 07/31/2019 at 10:10 AM  Between 7am to 6pm - Pager - [phone] After 6pm go to [email or web address] - password [email or web address] [place] [facility]  Office  [phone]  CC: Primary care physician; Dion Body, MD  Note: This dictation was prepared with Dragon dictation along with smaller phrase technology. Any transcriptional errors that result from this process are unintentional.

## 2019-08-01 LAB — BASIC METABOLIC PANEL
Anion gap: 9 (ref 5–15)
BUN: 16 mg/dL (ref 8–23)
CO2: 26 mmol/L (ref 22–32)
Calcium: 8.6 mg/dL — ABNORMAL LOW (ref 8.9–10.3)
Chloride: 99 mmol/L (ref 98–111)
Creatinine, Ser: 0.79 mg/dL (ref 0.61–1.24)
GFR calc Af Amer: >60 mL/min (ref >60)
GFR calc non Af Amer: >60 mL/min (ref >60)
Glucose, Bld: 94 mg/dL (ref 70–99)
Potassium: 4.5 mmol/L (ref 3.5–5.1)
Sodium: 134 mmol/L — ABNORMAL LOW (ref 135–145)

## 2019-08-01 LAB — MAGNESIUM: Magnesium: 1.6 mg/dL — ABNORMAL LOW (ref 1.7–2.4)

## 2019-08-01 LAB — PHOSPHORUS: Phosphorus: 2.7 mg/dL (ref 2.5–4.6)

## 2019-08-01 MED ORDER — DOCUSATE SODIUM 100 MG PO CAPS
100.0000 mg | ORAL_CAPSULE | Freq: Two times a day (BID) | ORAL | Status: DC
  Administered 2019-08-01 – 2019-08-04 (×7): 100 mg via ORAL
  Filled 2019-08-01 (×7): qty 1

## 2019-08-01 MED ORDER — CEPHALEXIN 250 MG PO CAPS: 250.0000 mg | ORAL_CAPSULE | Freq: Four times a day (QID) | ORAL | 0 refills | Status: AC

## 2019-08-01 MED ORDER — MAGNESIUM SULFATE 2 GM/50ML IV SOLN
2.0000 g | Freq: Once | INTRAVENOUS | Status: AC
  Administered 2019-08-01: 2 g via INTRAVENOUS
  Filled 2019-08-01: qty 50

## 2019-08-01 MED ORDER — OXYCODONE HCL 5 MG PO TABS: 5.0000 mg | ORAL_TABLET | ORAL | 0 refills | Status: DC | PRN

## 2019-08-01 MED ORDER — POLYETHYLENE GLYCOL 3350 17 G PO PACK
17.0000 g | PACK | Freq: Every day | ORAL | Status: DC
  Administered 2019-08-01 – 2019-08-04 (×4): 17 g via ORAL
  Filled 2019-08-01 (×4): qty 1

## 2019-08-01 MED ORDER — FLEET ENEMA 7-19 GM/118ML RE ENEM
1.0000 | ENEMA | Freq: Once | RECTAL | Status: AC
  Administered 2019-08-01: 1 via RECTAL

## 2019-08-01 MED ORDER — CEPHALEXIN 250 MG PO CAPS
250.0000 mg | ORAL_CAPSULE | Freq: Four times a day (QID) | ORAL | Status: AC
  Administered 2019-08-01 – 2019-08-03 (×8): 250 mg via ORAL
  Filled 2019-08-01 (×8): qty 1

## 2019-08-01 MED ORDER — SENNA 8.6 MG PO TABS
1.0000 | ORAL_TABLET | Freq: Every day | ORAL | Status: DC
  Administered 2019-08-01 – 2019-08-04 (×4): 8.6 mg via ORAL
  Filled 2019-08-01 (×4): qty 1

## 2019-08-01 NOTE — Progress Notes (Signed)
[facility] [place] [facility]   PATIENT NAME: Richard CowerLarry Carter    MR#:  [id number]  DATE OF BIRTH:  11/02/1941  SUBJECTIVE:  No issues overnight  REVIEW OF SYSTEMS:    Review of Systems  Constitutional: Negative for fever, chills weight loss HENT: Negative for ear pain, nosebleeds, congestion, facial swelling, rhinorrhea, neck pain, neck stiffness and ear discharge.   Respiratory: Negative for cough, shortness of breath, wheezing  Cardiovascular: Negative for chest pain, palpitations and leg swelling.  Gastrointestinal: Negative for heartburn, abdominal pain, vomiting, diarrhea or consitpation Genitourinary: Negative for dysuria, urgency, frequency, hematuria Musculoskeletal: Negative for back pain or+ RIGHT HIP PAIN  Neurological: Negative for dizziness, seizures, syncope, focal weakness,  numbness and headaches.  Hematological: Does not bruise/bleed easily.  Psychiatric/Behavioral: Negative for hallucinations, confusion, dysphoric mood    Tolerating Diet: yes      DRUG ALLERGIES:  No Known Allergies  VITALS:  Blood pressure (!) 166/73, pulse (!) 56, temperature 98.6 F (37 C), temperature source Oral, resp. rate 17, height 5' 4.5" (1.638 m), weight 60.1 kg, SpO2 95 %.  PHYSICAL EXAMINATION:  Constitutional: Appears well-developed and well-nourished. No distress. HENT: Normocephalic. Marland Kitchen. Oropharynx is clear and moist.  Eyes: Conjunctivae and EOM are normal. PERRLA, no scleral icterus.  Neck: Normal ROM. Neck supple. No JVD. No tracheal deviation. CVS: RRR, S1/S2 +, no murmurs, no gallops, no carotid bruit.  Pulmonary: Effort and breath sounds normal, no stridor, rhonchi, wheezes, rales.  Abdominal: Soft. BS +,  no distension, tenderness, rebound or guarding.  Musculoskeletal: right hip pain no bruising pain with movement. No edema and no tenderness.  Neuro: Alert. CN 2-12 grossly intact. No focal deficits. Skin: Skin is warm and dry. No rash  noted. Psychiatric: Normal mood and affect.      LABORATORY PANEL:   CBC Recent Labs  Lab 07/31/19 0544  WBC 11.5*  HGB 11.0*  HCT 31.7*  PLT 235   ------------------------------------------------------------------------------------------------------------------  Chemistries  Recent Labs  Lab 08/01/19 0434  NA 134*  K 4.5  CL 99  CO2 26  GLUCOSE 94  BUN 16  CREATININE 0.79  CALCIUM 8.6*  MG 1.6*   ------------------------------------------------------------------------------------------------------------------  Cardiac Enzymes No results for input(s): TROPONINI in the last 168 hours. ------------------------------------------------------------------------------------------------------------------  RADIOLOGY:  Ct Pelvis Wo Contrast  Result Date: 07/29/2019 CLINICAL DATA:  Hip pain EXAM: CT PELVIS WITHOUT CONTRAST TECHNIQUE: Multidetector CT imaging of the pelvis was performed following the standard protocol without intravenous contrast. COMPARISON:  CT dated September 28, 2013.  This is FINDINGS: Urinary Tract: There is diffuse bladder wall thickening. There appears to be at least 1 bladder diverticulum containing a small calcification. There is asymmetric enlargement of the left seminal vesicle, which currently measures approximately 1.8 x 2.9 cm. This is somewhat similar to prior CT in 2014 and therefore likely represents a benign etiology. Bowel:  Unremarkable visualized pelvic bowel loops. Vascular/Lymphatic: There are advanced atherosclerotic changes involving the partially visualized lower abdominal aorta. Reproductive:  The prostate gland is significantly enlarged. Other:  None Musculoskeletal: There is an acute mildly displaced fracture of the right inferior pubic ramus. There are acute nondisplaced fractures through the anterior column of the right acetabulum extending into the roof. There is a small pelvic wall hematoma. There are advanced degenerative changes of the  partially visualized lower lumbar spine, severe at the L4-L5 and L3-L4 levels. IMPRESSION: 1. Acute fractures of the right acetabulum and right inferior pubic ramus as detailed above. 2. Small right-sided  pelvic hematoma. 3. Diffuse bladder wall thickening with at least one bladder diverticulum. Findings are favored to be secondary to chronic outlet obstruction. The prostate gland is significantly enlarged. Aortic Atherosclerosis (ICD10-I70.0). Electronically Signed   By: Constance Holster M.D.   On: 07/29/2019 20:38   Dg Chest Port 1 View  Result Date: 07/29/2019 CLINICAL DATA:  Fall yesterday. Right hip fracture. EXAM: PORTABLE CHEST 1 VIEW COMPARISON:  Radiograph 06/07/2014 FINDINGS: The cardiomediastinal contours are normal. The lungs are clear. Pulmonary vasculature is normal. No consolidation, pleural effusion, or pneumothorax. Multiple left greater than right rib fractures. Fracture of left lateral second rib is age indeterminate. IMPRESSION: Age-indeterminate left lateral second rib fracture, given presence of multiple remote left rib fractures this is likely not acute, however recommend correlation for chest tenderness. No other acute finding. Electronically Signed   By: Keith Rake M.D.   On: 07/29/2019 20:58   Dg Hip Unilat With Pelvis 2-3 Views Right  Result Date: 07/29/2019 CLINICAL DATA:  Fall with hip pain. EXAM: DG HIP (WITH OR WITHOUT PELVIS) 2-3V RIGHT COMPARISON:  None. FINDINGS: There is mild-to-moderate osteoarthritis involving both hips. Phleboliths project over the patient's pelvis. There is no definite displaced fracture or dislocation involving the right femur. There is a subtle irregularity of the right inferior pubic ramus. IMPRESSION: 1. No definite displaced fracture or dislocation involving the right femur. 2. Subtle irregularity of the right inferior pubic ramus may represent a nondisplaced fracture. Electronically Signed   By: Constance Holster M.D.   On: 07/29/2019 19:54      ASSESSMENT AND PLAN:     78 year old male with tobacco dependence and hyperlipidemia who presented to the emergency room complaining of right hip pain after fall.  1. Acute fractures of the right acetabulum and right inferior pubic ramus: Patient has been evaluated by orthopedic surgery. This is nonoperable. Continue supportive management with physical therapy and pain control.    2.E coli UTI: Patient will be discharged on oral Keflex to complete course of treatment.  3. Hyperlipidemia: Continue Lipitor  4.Electrolyte abnormalities including phosphorus, potassium and magnesium: These were repleted.    5. Depression: Continue Lexapro  6. Tobacco dependence: Patient is encouraged to quit smoking and willing to attempt to quit was assessed. Patient motivated.Counseling was provided.  Management plans discussed with the patient and he is in agreement.  CODE STATUS: full  TOTAL TIME TAKING CARE OF THIS PATIENT: 24 minutes.   Discussed with clinical social worker for discharge planning  POSSIBLE D/C once we have insurance approval   Bettey Costa M.D on 08/01/2019 at 11:32 AM  Between 7am to 6pm - Pager - [phone] After 6pm go to [email or web address] - password [email or web address] [facility]  Office  [phone]  CC: Primary care physician; Dion Body, MD  Note: This dictation was prepared with Dragon dictation along with smaller phrase technology. Any transcriptional errors that result from this process are unintentional.

## 2019-08-01 NOTE — TOC Progression Note (Signed)
Transition of Care [facility]) - Progression Note    Patient Details  Name: Richard Carter MRN: [id number] Date of Birth: 09-13-1941  Transition of [facility]) CM/SW Contact  Latanya Maudlin, RN Phone Number: 08/01/2019, 1:41 PM  Clinical Narrative:  Patient wishes to go to [facility] at discharge. Voicemail left with Neoma Laming in admissions to see if they could offer a bed.     Expected Discharge Plan: [place] Barriers to Discharge: Continued Medical Work up  Expected Discharge Plan and Services Expected Discharge Plan: [place]   Discharge Planning Services: CM Consult Post Acute Care Choice: [place] Living arrangements for the past 2 months: Single Family Home Expected Discharge Date: 08/01/19                                     Social Determinants of Health (SDOH) Interventions    Readmission Risk Interventions No flowsheet data found.

## 2019-08-01 NOTE — Consult Note (Signed)
PHARMACY CONSULT NOTE - FOLLOW UP  Pharmacy Consult for Electrolyte Monitoring and Replacement   HPI: 78 y/o admitted for right pubic ramus and acetabular fracture after fall. Patient found to have UTI with reported hesitancy on ceftriaxone;  Recent Labs: Potassium (mmol/L)  Date Value  08/01/2019 4.5  10/25/2014 3.3 (L)   Magnesium (mg/dL)  Date Value  08/01/2019 1.6 (L)  06/07/2014 2.1   Calcium (mg/dL)  Date Value  08/01/2019 8.6 (L)   Calcium, Total (mg/dL)  Date Value  10/25/2014 8.9   Albumin (g/dL)  Date Value  10/25/2014 3.7   Phosphorus (mg/dL)  Date Value  08/01/2019 2.7   Sodium (mmol/L)  Date Value  08/01/2019 134 (L)  10/25/2014 133 (L)     Assessment: -Hypokalemia but improved from yesterday after 40 mEq oral potassium. Patient takes 20 mEq oral potassium daily at home.  Goal of Therapy:  Replete electrolytes to normal levels  Plan:  Will order Mg 2g IV x 1.   Thank you   Oswald Hillock, PharmD, BCPS Clinical Pharmacist 08/01/2019 8:55 AM

## 2019-08-01 NOTE — Progress Notes (Signed)
Physical Therapy Treatment Patient Details Name: Richard Carter MRN: [id number] DOB: 09/17/1941 Today's Date: 08/01/2019    History of Present Illness 78 y/o male s/p fall at home, c/o R hip pain.   Imaging reveals right pubic ramus and acetabular fractures.  He was also found to have a UTI,    PT Comments    Pt initially refusing but agrees with encouragement.  Side lying with HOB raised slouched down upon arrival.  HOB lowered and he was able to sit EOB with encouragement.  Once sitting, generally steady.  Stood x 2 with close min a x 1.  No buckling noted but pt sat quickly each attempt stating he was unable to continue.  Self initiated return to side lying and curled back up with his blankets.  Declined further intervention.   Follow Up Recommendations  SNF     Equipment Recommendations  3in1 (PT)    Recommendations for Other Services       Precautions / Restrictions Precautions Precautions: Fall Restrictions Weight Bearing Restrictions: Yes RLE Weight Bearing: Touchdown weight bearing    Mobility  Bed Mobility Overal bed mobility: Needs Assistance Bed Mobility: Supine to Sit;Sit to Supine     Supine to sit: Min guard Sit to supine: Min guard      Transfers Overall transfer level: Needs assistance Equipment used: Rolling walker (2 wheeled) Transfers: Sit to/from Stand Sit to Stand: Min assist;Mod assist         General transfer comment: stands briefly before sitting - self limits activity  Ambulation/Gait             General Gait Details: refuses to attempt   Stairs             Wheelchair Mobility    Modified Rankin (Stroke Patients Only)       Balance Overall balance assessment: Needs assistance Sitting-balance support: Bilateral upper extremity supported Sitting balance-Leahy Scale: Fair     Standing balance support: Bilateral upper extremity supported Standing balance-Leahy Scale: Poor                               Cognition Arousal/Alertness: Awake/alert Behavior During Therapy: WFL for tasks assessed/performed Overall Cognitive Status: Within Functional Limits for tasks assessed                                        Exercises      General Comments        Pertinent Vitals/Pain Pain Assessment: Faces Faces Pain Scale: Hurts little more Pain Location: Does not rate pain but repeats "I can't do it" Pain Descriptors / Indicators: Sore;Guarding Pain Intervention(s): Limited activity within patient's tolerance;Monitored during session    Home Living                      Prior Function            PT Goals (current goals can now be found in the care plan section) Progress towards PT goals: Progressing toward goals    Frequency    7X/week      PT Plan Current plan remains appropriate    Co-evaluation              AM-PAC PT "6 Clicks" Mobility   Outcome Measure  Help needed turning from your back to your side  while in a flat bed without using bedrails?: A Little Help needed moving from lying on your back to sitting on the side of a flat bed without using bedrails?: A Little Help needed moving to and from a bed to a chair (including a wheelchair)?: A Lot Help needed standing up from a chair using your arms (e.g., wheelchair or bedside chair)?: A Lot Help needed to walk in hospital room?: Total Help needed climbing 3-5 steps with a railing? : Total 6 Click Score: 12    End of Session Equipment Utilized During Treatment: Gait belt Activity Tolerance: Patient tolerated treatment well;Patient limited by pain Patient left: in bed;with call bell/phone within reach;with bed alarm set Nurse Communication: Mobility status       Time: [id number] PT Time Calculation (min) (ACUTE ONLY): 8 min  Charges:  $Therapeutic Activity: 8-22 mins                    Richard DessSarah Armida Carter, PTA 08/01/19, 9:54 AM

## 2019-08-01 NOTE — Discharge Summary (Addendum)
[place] [facility] [place] NAME: Quinn Quam    MR#:  [id number]  DATE OF BIRTH:  08-30-1941  DATE OF ADMISSION:  07/29/2019 ADMITTING PHYSICIAN: Lance Coon, MD  DATE OF DISCHARGE: 08/04/2019   PRIMARY CARE PHYSICIAN: Dion Body, MD    ADMISSION DIAGNOSIS:  Right hip pain [M25.551] Fall, initial encounter [W19.XXXA] Closed fracture of right hip, initial encounter ([place]) [S72.001A]  DISCHARGE DIAGNOSIS:  Principal Problem:   Acetabular fracture ([place]) Active Problems:   HLD (hyperlipidemia)   GERD (gastroesophageal reflux disease)   UTI (urinary tract infection)   SECONDARY DIAGNOSIS:   Past Medical History:  Diagnosis Date  . Alcohol abuse   . GERD (gastroesophageal reflux disease)   . HLD (hyperlipidemia)   . HTN (hypertension)     HOSPITAL COURSE:   78 year old male with tobacco dependence and hyperlipidemia who presented to the emergency room complaining of right hip pain after fall.  1.  Acute fractures of the right acetabulum and right inferior pubic ramus: Patient has been evaluated by orthopedic surgery.  This is nonoperable.  Continue supportive management with physical therapy and pain control.    2.  E coli UTI: Patient will be discharged on oral Keflex to complete course of treatment.  3.  Hyperlipidemia: Continue Lipitor  4.  Electrolyte abnormalities including phosphorus, potassium and magnesium: These were repleted.    5.  Depression: Continue Lexapro  6. Tobacco dependence: Patient is encouraged to quit smoking and willing to attempt to quit was assessed. Patient motivated.Counseling was provided.   DISCHARGE CONDITIONS AND DIET:   Stable for discharge regular diet  CONSULTS OBTAINED:    DRUG ALLERGIES:  No Known Allergies  DISCHARGE MEDICATIONS:   Allergies as of 08/01/2019   No Known Allergies     Medication List    STOP taking these medications   potassium chloride SA 20 MEQ  tablet Commonly known as: K-DUR     TAKE these medications   atorvastatin 20 MG tablet Commonly known as: LIPITOR Take 20 mg by mouth at bedtime.   cephALEXin 250 MG capsule Commonly known as: KEFLEX Take 1 capsule (250 mg total) by mouth every 6 (six) hours for 4 days.   escitalopram 10 MG tablet Commonly known as: LEXAPRO Take 10 mg by mouth daily.   omeprazole 40 MG capsule Commonly known as: PRILOSEC Take 40 mg by mouth 2 (two) times daily.   oxyCODONE 5 MG immediate release tablet Commonly known as: Oxy IR/ROXICODONE Take 1 tablet (5 mg total) by mouth every 4 (four) hours as needed for moderate pain.   traZODone 100 MG tablet Commonly known as: DESYREL Take 200 mg by mouth at bedtime as needed for sleep.         Today   CHIEF COMPLAINT:  No acute events overnight.   VITAL SIGNS:  Blood pressure (!) 166/73, pulse (!) 56, temperature 98.6 F (37 C), temperature source Oral, resp. rate 17, height 5' 4.5" (1.638 m), weight 60.1 kg, SpO2 95 %.   REVIEW OF SYSTEMS:  Review of Systems  Constitutional: Negative.  Negative for chills, fever and malaise/fatigue.  HENT: Negative.  Negative for ear discharge, ear pain, hearing loss, nosebleeds and sore throat.   Eyes: Negative.  Negative for blurred vision and pain.  Respiratory: Negative.  Negative for cough, hemoptysis, shortness of breath and wheezing.   Cardiovascular: Negative.  Negative for chest pain, palpitations and leg swelling.  Gastrointestinal: Negative.  Negative for abdominal pain, blood  in stool, diarrhea, nausea and vomiting.  Genitourinary: Negative.  Negative for dysuria.  Musculoskeletal: Negative for back pain and joint pain (better).  Skin: Negative.   Neurological: Negative for dizziness, tremors, speech change, focal weakness, seizures and headaches.  Endo/Heme/Allergies: Negative.  Does not bruise/bleed easily.  Psychiatric/Behavioral: Negative.  Negative for depression, hallucinations and  suicidal ideas.     PHYSICAL EXAMINATION:  GENERAL:  78 y.o.-year-old patient lying in the bed with no acute distress.  NECK:  Supple, no jugular venous distention. No thyroid enlargement, no tenderness.  LUNGS: Normal breath sounds bilaterally, no wheezing, rales,rhonchi  No use of accessory muscles of respiration.  CARDIOVASCULAR: S1, S2 normal. No murmurs, rubs, or gallops.  ABDOMEN: Soft, non-tender, non-distended. Bowel sounds present. No organomegaly or mass.  EXTREMITIES: No pedal edema, cyanosis, or clubbing.  PSYCHIATRIC: The patient is alert and oriented x 3.  SKIN: No obvious rash, lesion, or ulcer.   DATA REVIEW:   CBC Recent Labs  Lab 07/31/19 0544  WBC 11.5*  HGB 11.0*  HCT 31.7*  PLT 235    Chemistries  Recent Labs  Lab 08/01/19 0434  NA 134*  K 4.5  CL 99  CO2 26  GLUCOSE 94  BUN 16  CREATININE 0.79  CALCIUM 8.6*  MG 1.6*    Cardiac Enzymes No results for input(s): TROPONINI in the last 168 hours.  Microbiology Results  @[id number] @  RADIOLOGY:  No results found.    Allergies as of 08/01/2019   No Known Allergies     Medication List    STOP taking these medications   potassium chloride SA 20 MEQ tablet Commonly known as: K-DUR     TAKE these medications   atorvastatin 20 MG tablet Commonly known as: LIPITOR Take 20 mg by mouth at bedtime.   cephALEXin 250 MG capsule Commonly known as: KEFLEX Take 1 capsule (250 mg total) by mouth every 6 (six) hours for 4 days.   escitalopram 10 MG tablet Commonly known as: LEXAPRO Take 10 mg by mouth daily.   omeprazole 40 MG capsule Commonly known as: PRILOSEC Take 40 mg by mouth 2 (two) times daily.   oxyCODONE 5 MG immediate release tablet Commonly known as: Oxy IR/ROXICODONE Take 1 tablet (5 mg total) by mouth every 4 (four) hours as needed for moderate pain.   traZODone 100 MG tablet Commonly known as: DESYREL Take 200 mg by mouth at bedtime as needed for sleep.          Management plans discussed with the patient and he is in agreement. Stable for discharge   Patient should follow up with ortho  CODE STATUS:     Code Status Orders  (From admission, onward)         Start     Ordered   07/29/19 2306  Full code  Continuous     07/29/19 2305        Code Status History    This patient has a current code status but no historical code status.   Advance Care Planning Activity      TOTAL TIME TAKING CARE OF THIS PATIENT: 39 minutes.    Note: This dictation was prepared with Dragon dictation along with smaller phrase technology. Any transcriptional errors that result from this process are unintentional.  Adrian SaranSital Tavone Caesar M.D on 08/01/2019 at 10:33 AM  Between 7am to 6pm - Pager - [phone] After 6pm go to [email or web address] - Social research officer, governmentpassword [email or web address]  [facility]  Office  [phone]  CC: Primary care physician; Dion Body, MD

## 2019-08-02 LAB — BASIC METABOLIC PANEL
Anion gap: 11 (ref 5–15)
BUN: 12 mg/dL (ref 8–23)
CO2: 23 mmol/L (ref 22–32)
Calcium: 9.1 mg/dL (ref 8.9–10.3)
Chloride: 97 mmol/L — ABNORMAL LOW (ref 98–111)
Creatinine, Ser: 0.77 mg/dL (ref 0.61–1.24)
GFR calc Af Amer: >60 mL/min (ref >60)
GFR calc non Af Amer: >60 mL/min (ref >60)
Glucose, Bld: 85 mg/dL (ref 70–99)
Potassium: 4.1 mmol/L (ref 3.5–5.1)
Sodium: 131 mmol/L — ABNORMAL LOW (ref 135–145)

## 2019-08-02 LAB — MAGNESIUM: Magnesium: 1.6 mg/dL — ABNORMAL LOW (ref 1.7–2.4)

## 2019-08-02 MED ORDER — MAGNESIUM SULFATE 2 GM/50ML IV SOLN
2.0000 g | Freq: Once | INTRAVENOUS | Status: AC
  Administered 2019-08-02: 15:00:00 2 g via INTRAVENOUS
  Filled 2019-08-02: qty 50

## 2019-08-02 NOTE — Consult Note (Signed)
PHARMACY CONSULT NOTE - FOLLOW UP  Pharmacy Consult for Electrolyte Monitoring and Replacement   HPI: 78 y/o admitted for right pubic ramus and acetabular fracture after fall. Patient found to have UTI with reported hesitancy on ceftriaxone;  Recent Labs: Potassium (mmol/L)  Date Value  08/02/2019 4.1  10/25/2014 3.3 (L)   Magnesium (mg/dL)  Date Value  08/02/2019 1.6 (L)  06/07/2014 2.1   Calcium (mg/dL)  Date Value  08/02/2019 9.1   Calcium, Total (mg/dL)  Date Value  10/25/2014 8.9   Albumin (g/dL)  Date Value  10/25/2014 3.7   Phosphorus (mg/dL)  Date Value  08/01/2019 2.7   Sodium (mmol/L)  Date Value  08/02/2019 131 (L)  10/25/2014 133 (L)     Assessment: Hypokalemia resolved - magnesium still low @ 1.6  Goal of Therapy:  Replete electrolytes to normal levels  Plan:  Will order Mg 2g IV x 1.   Thank you   Lu Duffel, PharmD, BCPS Clinical Pharmacist 08/02/2019 1:44 PM

## 2019-08-02 NOTE — Progress Notes (Signed)
[employer] - [place] [facility]   PATIENT NAME: Richard CowerLarry Carter    MR#:  [id number]  DATE OF BIRTH:  02/03/1941  SUBJECTIVE:  No issues overnight Waiting for insurance for SNF  REVIEW OF SYSTEMS:    Review of Systems  Constitutional: Negative for fever, chills weight loss HENT: Negative for ear pain, nosebleeds, congestion, facial swelling, rhinorrhea, neck pain, neck stiffness and ear discharge.   Respiratory: Negative for cough, shortness of breath, wheezing  Cardiovascular: Negative for chest pain, palpitations and leg swelling.  Gastrointestinal: Negative for heartburn, abdominal pain, vomiting, diarrhea or consitpation Genitourinary: Negative for dysuria, urgency, frequency, hematuria Musculoskeletal: Negative for back pain or+ RIGHT HIP PAIN  Neurological: Negative for dizziness, seizures, syncope, focal weakness,  numbness and headaches.  Hematological: Does not bruise/bleed easily.  Psychiatric/Behavioral: Negative for hallucinations, confusion, dysphoric mood    Tolerating Diet: yes      DRUG ALLERGIES:  No Known Allergies  VITALS:  Blood pressure (!) 142/72, pulse (!) 53, temperature 98.1 F (36.7 C), temperature source Oral, resp. rate 17, height 5' 4.5" (1.638 m), weight 60.1 kg, SpO2 98 %.  PHYSICAL EXAMINATION:  Constitutional: Appears well-developed and well-nourished. No distress. HENT: Normocephalic. Marland Kitchen. Oropharynx is clear and moist.  Eyes: Conjunctivae and EOM are normal. PERRLA, no scleral icterus.  Neck: Normal ROM. Neck supple. No JVD. No tracheal deviation. CVS: RRR, S1/S2 +, no murmurs, no gallops, no carotid bruit.  Pulmonary: Effort and breath sounds normal, no stridor, rhonchi, wheezes, rales.  Abdominal: Soft. BS +,  no distension, tenderness, rebound or guarding.  Musculoskeletal: right hip pain no bruising pain with movement. No edema and no tenderness.  Neuro: Alert. CN 2-12 grossly intact. No focal deficits. Skin: Skin  is warm and dry. No rash noted. Psychiatric: Normal mood and affect.      LABORATORY PANEL:   CBC Recent Labs  Lab 07/31/19 0544  WBC 11.5*  HGB 11.0*  HCT 31.7*  PLT 235   ------------------------------------------------------------------------------------------------------------------  Chemistries  Recent Labs  Lab 08/02/19 0745  NA 131*  K 4.1  CL 97*  CO2 23  GLUCOSE 85  BUN 12  CREATININE 0.77  CALCIUM 9.1  MG 1.6*   ------------------------------------------------------------------------------------------------------------------  Cardiac Enzymes No results for input(s): TROPONINI in the last 168 hours. ------------------------------------------------------------------------------------------------------------------  RADIOLOGY:  Ct Pelvis Wo Contrast  Result Date: 07/29/2019 CLINICAL DATA:  Hip pain EXAM: CT PELVIS WITHOUT CONTRAST TECHNIQUE: Multidetector CT imaging of the pelvis was performed following the standard protocol without intravenous contrast. COMPARISON:  CT dated September 28, 2013.  This is FINDINGS: Urinary Tract: There is diffuse bladder wall thickening. There appears to be at least 1 bladder diverticulum containing a small calcification. There is asymmetric enlargement of the left seminal vesicle, which currently measures approximately 1.8 x 2.9 cm. This is somewhat similar to prior CT in 2014 and therefore likely represents a benign etiology. Bowel:  Unremarkable visualized pelvic bowel loops. Vascular/Lymphatic: There are advanced atherosclerotic changes involving the partially visualized lower abdominal aorta. Reproductive:  The prostate gland is significantly enlarged. Other:  None Musculoskeletal: There is an acute mildly displaced fracture of the right inferior pubic ramus. There are acute nondisplaced fractures through the anterior column of the right acetabulum extending into the roof. There is a small pelvic wall hematoma. There are advanced  degenerative changes of the partially visualized lower lumbar spine, severe at the L4-L5 and L3-L4 levels. IMPRESSION: 1. Acute fractures of the right acetabulum and right inferior pubic ramus as  detailed above. 2. Small right-sided pelvic hematoma. 3. Diffuse bladder wall thickening with at least one bladder diverticulum. Findings are favored to be secondary to chronic outlet obstruction. The prostate gland is significantly enlarged. Aortic Atherosclerosis (ICD10-I70.0). Electronically Signed   By: Constance Holster M.D.   On: 07/29/2019 20:38   Dg Chest Port 1 View  Result Date: 07/29/2019 CLINICAL DATA:  Fall yesterday. Right hip fracture. EXAM: PORTABLE CHEST 1 VIEW COMPARISON:  Radiograph 06/07/2014 FINDINGS: The cardiomediastinal contours are normal. The lungs are clear. Pulmonary vasculature is normal. No consolidation, pleural effusion, or pneumothorax. Multiple left greater than right rib fractures. Fracture of left lateral second rib is age indeterminate. IMPRESSION: Age-indeterminate left lateral second rib fracture, given presence of multiple remote left rib fractures this is likely not acute, however recommend correlation for chest tenderness. No other acute finding. Electronically Signed   By: Keith Rake M.D.   On: 07/29/2019 20:58   Dg Hip Unilat With Pelvis 2-3 Views Right  Result Date: 07/29/2019 CLINICAL DATA:  Fall with hip pain. EXAM: DG HIP (WITH OR WITHOUT PELVIS) 2-3V RIGHT COMPARISON:  None. FINDINGS: There is mild-to-moderate osteoarthritis involving both hips. Phleboliths project over the patient's pelvis. There is no definite displaced fracture or dislocation involving the right femur. There is a subtle irregularity of the right inferior pubic ramus. IMPRESSION: 1. No definite displaced fracture or dislocation involving the right femur. 2. Subtle irregularity of the right inferior pubic ramus may represent a nondisplaced fracture. Electronically Signed   By: Constance Holster  M.D.   On: 07/29/2019 19:54     ASSESSMENT AND PLAN:     78 year old male with tobacco dependence and hyperlipidemia who presented to the emergency room complaining of right hip pain after fall.  1. Acute fractures of the right acetabulum and right inferior pubic ramus: Patient has been evaluated by orthopedic surgery. This is nonoperable. Continue supportive management with physical therapy and pain control.    2.E coli UTI: Patient will be discharged on oral Keflex to complete course of treatment.  3. Hyperlipidemia: Continue Lipitor  4.Electrolyte abnormalities including phosphorus, potassium and magnesium: These were repleted.    5. Depression: Continue Lexapro  6. Tobacco dependence: Patient is encouraged to quit smoking and willing to attempt to quit was assessed. Patient motivated.Counseling was provided.  Management plans discussed with the patient and he is in agreement.  CODE STATUS: full  TOTAL TIME TAKING CARE OF THIS PATIENT: 24 minutes.   Discussed with clinical social worker for discharge planning  POSSIBLE D/C once we have insurance approval   Bettey Costa M.D on 08/02/2019 at 12:19 PM  Between 7am to 6pm - Pager - [phone] After 6pm go to [email or web address] - password [email or web address] [facility] [place] [facility]  Office  [phone]  CC: Primary care physician; Dion Body, MD  Note: This dictation was prepared with Dragon dictation along with smaller phrase technology. Any transcriptional errors that result from this process are unintentional.

## 2019-08-02 NOTE — TOC Progression Note (Signed)
Transition of Care [facility]) - Progression Note    Patient Details  Name: Richard Carter MRN: [id number] Date of Birth: 06/28/1941  Transition of Care [facility]) CM/SW [place], RN Phone Number: 08/02/2019, 12:12 PM  Clinical Narrative:     The patient wants to accept the bed offer for WOM, I called and left a VM for Debra at [facility], [employer] office is closed for the holiday and we are unable to get authorization  Expected Discharge Plan: [place] Barriers to Discharge: Continued Medical Work up  Expected Discharge Plan and Services Expected Discharge Plan: [place]   Discharge Planning Services: CM Consult Post Acute Care Choice: [place] arrangements for the past 2 months: Single Family Home Expected Discharge Date: 08/02/19                                     Social Determinants of Health (SDOH) Interventions    Readmission Risk Interventions No flowsheet data found.

## 2019-08-02 NOTE — Progress Notes (Signed)
Physical Therapy Treatment Patient Details Name: Richard CashLarry L Reesor MRN: [id number] DOB: 07/14/1941 Today's Date: 08/02/2019    History of Present Illness 78 y/o male s/p fall at home, c/o R hip pain.   Imaging reveals right pubic ramus and acetabular fractures.  He was also found to have a UTI,    PT Comments    Patient needs less assistance to perform supine <> sit bed mobility today. He refuses sit to stand transfer training due to hip pain. He performs supine and sitting exercises with reports of hip pain. He will conintue to benefit from skilled PT to improve mobility and strength.   Follow Up Recommendations  SNF     Equipment Recommendations  3in1 (PT)    Recommendations for Other Services       Precautions / Restrictions Precautions Precautions: Fall Restrictions Weight Bearing Restrictions: Yes RLE Weight Bearing: Touchdown weight bearing    Mobility  Bed Mobility Overal bed mobility: Needs Assistance Bed Mobility: Supine to Sit;Sit to Supine     Supine to sit: Supervision Sit to supine: Supervision   General bed mobility comments: Pt able to scoot up in bed with heavy BUE reliance with bed rails and CGA  Transfers Overall transfer level: (Patient refused to attempt)                  Ambulation/Gait                 Stairs             Wheelchair Mobility    Modified Rankin (Stroke Patients Only)       Balance Overall balance assessment: Mild deficits observed, not formally tested Sitting-balance support: Bilateral upper extremity supported Sitting balance-Leahy Scale: Fair Sitting balance - Comments: PT able to tolerate having his bed placed into chair mode for UE assessment and OT education. Was able to utilize abdominal muscles to sit self upright for a brief time, but generally required back support. Pt fatigued after ~10 min sitting in this position and requested to be return to semi-supine.                                     Cognition Arousal/Alertness: Awake/alert Behavior During Therapy: WFL for tasks assessed/performed Overall Cognitive Status: Within Functional Limits for tasks assessed                                 General Comments: f      Exercises General Exercises - Lower Extremity Ankle Circles/Pumps: AROM;Both;15 reps Quad Sets: Strengthening;Both;15 reps Gluteal Sets: Strengthening;Both;15 reps Short Arc Quad: AAROM;Both;15 reps Long Arc Quad: 15 reps    General Comments        Pertinent Vitals/Pain Pain Assessment: 0-10 Pain Score: 5  Pain Location: hips with bed mobility, 4/10 at rest Pain Descriptors / Indicators: Aching;Grimacing;Guarding    Home Living                      Prior Function            PT Goals (current goals can now be found in the care plan section) Acute Rehab PT Goals Patient Stated Goal: Pt very much wants to go home, but knows this would be very difficult PT Goal Formulation: With patient Time For Goal Achievement: 08/13/19 Potential to Achieve Goals:  Fair Progress towards PT goals: Progressing toward goals    Frequency    7X/week      PT Plan Current plan remains appropriate    Co-evaluation              AM-PAC PT "6 Clicks" Mobility   Outcome Measure  Help needed turning from your back to your side while in a flat bed without using bedrails?: A Little Help needed moving from lying on your back to sitting on the side of a flat bed without using bedrails?: A Little Help needed moving to and from a bed to a chair (including a wheelchair)?: A Little Help needed standing up from a chair using your arms (e.g., wheelchair or bedside chair)?: A Lot Help needed to walk in hospital room?: Total Help needed climbing 3-5 steps with a railing? : Total 6 Click Score: 13    End of Session Equipment Utilized During Treatment: Gait belt Activity Tolerance: Patient tolerated treatment well;Patient limited by  pain Patient left: in bed;with call bell/phone within reach;with bed alarm set Nurse Communication: Mobility status PT Visit Diagnosis: Muscle weakness (generalized) (M62.81);Difficulty in walking, not elsewhere classified (R26.2)     Time: 3267-1245 PT Time Calculation (min) (ACUTE ONLY): 15 min  Charges:  $Therapeutic Activity: 8-22 mins                        Alanson Puls , PT DPT 08/02/2019, 3:19 PM

## 2019-08-02 NOTE — Progress Notes (Signed)
Occupational Therapy Treatment Patient Details Name: Richard Carter MRN: [id number] DOB: 08-27-1941 Today's Date: 08/02/2019    History of present illness 78 y/o male s/p fall at home, c/o R hip pain.   Imaging reveals right pubic ramus and acetabular fractures.  He was also found to have a UTI,   OT comments  Pt seen for OT tx this date. Pt agreeable to participate, but pain limited. Pt reporting 4/10 hip/pelvic pain at rest increasing to 9/10 with bed mobility to improve positioning, pain mgt, and minimize risk of skin breakdown. Pt able to perform with heavy BUE reliance and CGA. Pt instructed in AE for LB ADL tasks to minimize hip pain, pt verbalized understanding but declined to trial 2/2 pain. Pt continues to benefit from skilled OT services. Continue to recommend SNF at this time.     Follow Up Recommendations  SNF;Supervision/Assistance - 24 hour    Equipment Recommendations  3 in 1 bedside commode;Other (comment)(reacher)    Recommendations for Other Services      Precautions / Restrictions Precautions Precautions: Fall Restrictions Weight Bearing Restrictions: Yes RLE Weight Bearing: Touchdown weight bearing       Mobility Bed Mobility Overal bed mobility: Needs Assistance             General bed mobility comments: Pt able to scoot up in bed with heavy BUE reliance with bed rails and CGA  Transfers                 General transfer comment: pt declined 2/2 pain    Balance                                           ADL either performed or assessed with clinical judgement   ADL                                         General ADL Comments: Continues to be pain limited, requiring Mod A for LB ADL from bed level or seated EOB     Vision Baseline Vision/History: Wears glasses Wears Glasses: At all times Patient Visual Report: No change from baseline     Perception     Praxis      Cognition Arousal/Alertness:  Awake/alert Behavior During Therapy: WFL for tasks assessed/performed Overall Cognitive Status: Within Functional Limits for tasks assessed                                          Exercises Other Exercises Other Exercises: Pt instructed in AE for LB ADL tasks to minimize hip pain, pt verbalized understanding but declined to trial 2/2 pain   Shoulder Instructions       General Comments      Pertinent Vitals/ Pain       Pain Assessment: 0-10 Pain Score: 9  Pain Location: hips with bed mobility, 4/10 at rest Pain Descriptors / Indicators: Aching;Grimacing;Guarding Pain Intervention(s): Limited activity within patient's tolerance;Monitored during session;Repositioned  Home Living  Prior Functioning/Environment              Frequency  Min 2X/week        Progress Toward Goals  OT Goals(current goals can now be found in the care plan section)  Progress towards OT goals: Progressing toward goals  Acute Rehab OT Goals Patient Stated Goal: Pt very much wants to go home, but knows this would be very difficult OT Goal Formulation: With patient Time For Goal Achievement: 08/13/19 Potential to Achieve Goals: Good  Plan Discharge plan remains appropriate;Frequency remains appropriate    Co-evaluation                 AM-PAC OT "6 Clicks" Daily Activity     Outcome Measure   Help from another person eating meals?: None Help from another person taking care of personal grooming?: A Little Help from another person toileting, which includes using toliet, bedpan, or urinal?: A Lot Help from another person bathing (including washing, rinsing, drying)?: A Lot Help from another person to put on and taking off regular upper body clothing?: A Little Help from another person to put on and taking off regular lower body clothing?: A Lot 6 Click Score: 16    End of Session    OT Visit Diagnosis:  Other abnormalities of gait and mobility (R26.89);Muscle weakness (generalized) (M62.81);Pain Pain - Right/Left: Right Pain - part of body: Hip   Activity Tolerance Patient limited by pain   Patient Left in bed;with call bell/phone within reach;with bed alarm set;with SCD's reapplied   Nurse Communication          Time: [id number] OT Time Calculation (min): 8 min  Charges: OT General Charges $OT Visit: 1 Visit OT Treatments $Therapeutic Activity: 8-22 mins  Richrd PrimeJamie Stiller, MPH, MS, OTR/L ascom [phone] 08/02/19, 9:54 AM

## 2019-08-02 NOTE — Care Management Important Message (Signed)
Important Message  Patient Details  Name: Richard Carter MRN: [id number] Date of Birth: 11-04-1941   Medicare Important Message Given:  Yes     Juliann Pulse A Cotey Rakes 08/02/2019, 11:03 AM

## 2019-08-03 LAB — BASIC METABOLIC PANEL
Anion gap: 9 (ref 5–15)
BUN: 13 mg/dL (ref 8–23)
CO2: 26 mmol/L (ref 22–32)
Calcium: 9.3 mg/dL (ref 8.9–10.3)
Chloride: 98 mmol/L (ref 98–111)
Creatinine, Ser: 0.75 mg/dL (ref 0.61–1.24)
GFR calc Af Amer: >60 mL/min (ref >60)
GFR calc non Af Amer: >60 mL/min (ref >60)
Glucose, Bld: 84 mg/dL (ref 70–99)
Potassium: 3.9 mmol/L (ref 3.5–5.1)
Sodium: 133 mmol/L — ABNORMAL LOW (ref 135–145)

## 2019-08-03 LAB — MAGNESIUM: Magnesium: 1.6 mg/dL — ABNORMAL LOW (ref 1.7–2.4)

## 2019-08-03 MED ORDER — CEPHALEXIN 250 MG PO CAPS
250.0000 mg | ORAL_CAPSULE | Freq: Four times a day (QID) | ORAL | Status: DC
  Administered 2019-08-03 – 2019-08-04 (×4): 250 mg via ORAL
  Filled 2019-08-03 (×5): qty 1

## 2019-08-03 MED ORDER — MAGNESIUM SULFATE 2 GM/50ML IV SOLN
2.0000 g | Freq: Once | INTRAVENOUS | Status: AC
  Administered 2019-08-03: 2 g via INTRAVENOUS
  Filled 2019-08-03: qty 50

## 2019-08-03 NOTE — Progress Notes (Signed)
Physical Therapy Treatment Patient Details Name: Richard CashLarry L Kazanjian MRN: [id number] DOB: 03/15/1941 Today's Date: 08/03/2019    History of Present Illness 78 y/o male s/p fall at home, c/o R hip pain.   Imaging reveals right pubic ramus and acetabular fractures.  He was also found to have a UTI,    PT Comments    Pt agreeable to PT; reports 9/10 pain in low back/hips with movement and 6/10 pain at rest. Pt declines out of bed participation despite education/encouragement on benefits. Pt demonstrates improved tolerance for exercises with increased repetitions and overall improved range. Pt assisted with repositioning upward in bed with Min A and use of trapeze. Continue PT to progress participation, endurance, strength to improve all functional mobility.    Follow Up Recommendations  SNF     Equipment Recommendations  3in1 (PT)    Recommendations for Other Services       Precautions / Restrictions Precautions Precautions: Fall Restrictions Weight Bearing Restrictions: Yes RLE Weight Bearing: Touchdown weight bearing    Mobility  Bed Mobility Overal bed mobility: Needs Assistance Bed Mobility: Rolling Rolling: Min assist(use of rails)         General bed mobility comments: Min A to reposition upward in bed with use of trapeze  Transfers                 General transfer comment: Declines out of bed  Ambulation/Gait                 Stairs             Wheelchair Mobility    Modified Rankin (Stroke Patients Only)       Balance                                            Cognition Arousal/Alertness: Awake/alert Behavior During Therapy: WFL for tasks assessed/performed Overall Cognitive Status: Within Functional Limits for tasks assessed                                        Exercises General Exercises - Lower Extremity Ankle Circles/Pumps: AROM;Both;20 reps Quad Sets: Strengthening;Both;20 reps Gluteal  Sets: Strengthening;Both;20 reps Short Arc Quad: AROM;Both;20 reps Heel Slides: AROM;Both;20 reps Hip ABduction/ADduction: AAROM;Both;20 reps;AROM(assist to maintain LEs off bed) Straight Leg Raises: AAROM;Both;10 reps(small range)    General Comments        Pertinent Vitals/Pain Pain Assessment: 0-10 Pain Score: 9 (with movement; 6 at rest) Pain Location: hips/low back Pain Descriptors / Indicators: Constant;Aching;Grimacing Pain Intervention(s): Limited activity within patient's tolerance;Monitored during session;Premedicated before session;Repositioned    Home Living                      Prior Function            PT Goals (current goals can now be found in the care plan section) Progress towards PT goals: Progressing toward goals(slowly)    Frequency    7X/week      PT Plan Current plan remains appropriate    Co-evaluation              AM-PAC PT "6 Clicks" Mobility   Outcome Measure  Help needed turning from your back to your side while in a flat bed without using  bedrails?: A Little Help needed moving from lying on your back to sitting on the side of a flat bed without using bedrails?: A Little Help needed moving to and from a bed to a chair (including a wheelchair)?: A Lot Help needed standing up from a chair using your arms (e.g., wheelchair or bedside chair)?: A Lot Help needed to walk in hospital room?: Total Help needed climbing 3-5 steps with a railing? : Total 6 Click Score: 12    End of Session   Activity Tolerance: Patient limited by pain(self) Patient left: in bed;with call bell/phone within reach;with bed alarm set   PT Visit Diagnosis: Muscle weakness (generalized) (M62.81);Difficulty in walking, not elsewhere classified (R26.2)     Time: 8110-3159 PT Time Calculation (min) (ACUTE ONLY): 28 min  Charges:  $Therapeutic Exercise: 23-37 mins                      Larae Grooms, PTA 08/03/2019, 11:47 AM

## 2019-08-03 NOTE — TOC Progression Note (Addendum)
Transition of Care [facility]) - Progression Note    Patient Details  Name: Richard Carter MRN: [id number] Date of Birth: 1940-12-01  [facility] [place] CM/SW Contact  Su Hilt, RN Phone Number: 08/03/2019, 10:32 AM  Clinical Narrative:    Neoma Laming at [facility] has faxed information to to insurance for [place] yesterday     Expected Discharge Plan: [place] Barriers to Discharge: Continued Medical Work up  Expected Discharge Plan and Services Expected Discharge Plan: [place]   Discharge Planning Services: CM Consult Post Acute Care Choice: [place] Living arrangements for the past 2 months: Single Family Home Expected Discharge Date: 08/02/19                                     Social Determinants of Health (SDOH) Interventions    Readmission Risk Interventions No flowsheet data found.

## 2019-08-03 NOTE — Consult Note (Signed)
PHARMACY CONSULT NOTE - FOLLOW UP  Pharmacy Consult for Electrolyte Monitoring and Replacement   HPI: 78 y/o admitted for right pubic ramus and acetabular fracture after fall. Patient found to have UTI with reported hesitancy on ceftriaxone;  Recent Labs: Potassium (mmol/L)  Date Value  08/03/2019 3.9  10/25/2014 3.3 (L)   Magnesium (mg/dL)  Date Value  08/03/2019 1.6 (L)  06/07/2014 2.1   Calcium (mg/dL)  Date Value  08/03/2019 9.3   Calcium, Total (mg/dL)  Date Value  10/25/2014 8.9   Albumin (g/dL)  Date Value  10/25/2014 3.7   Phosphorus (mg/dL)  Date Value  08/01/2019 2.7   Sodium (mmol/L)  Date Value  08/03/2019 133 (L)  10/25/2014 133 (L)     Assessment: Hypokalemia resolved - magnesium still low @ 1.6  Goal of Therapy:  Replete electrolytes to normal levels  Plan:  Will order Mg 2g IV x 1.   Thank you   Pearla Dubonnet, PharmD Clinical Pharmacist 08/03/2019 7:31 AM

## 2019-08-03 NOTE — Progress Notes (Signed)
OT Cancellation Note  Patient Details Name: Richard Carter MRN: [id number] DOB: February 10, 1941   Cancelled Treatment:    Reason Eval/Treat Not Completed: Other (comment). Upon attempt, pt with nursing for pt care. Will re-attempt OT tx at later date/time as available.   Jeni Salles, MPH, MS, OTR/L ascom [phone] 08/03/19, 2:53 PM

## 2019-08-03 NOTE — Progress Notes (Signed)
[employer] [facility] [place] [facility]   PATIENT NAME: Barbara CowerLarry Folts    MR#:  [id number]  DATE OF BIRTH:  05/10/1941  SUBJECTIVE:  Just waiting to leave hospital no acute issues  REVIEW OF SYSTEMS:    Review of Systems  Constitutional: Negative for fever, chills weight loss HENT: Negative for ear pain, nosebleeds, congestion, facial swelling, rhinorrhea, neck pain, neck stiffness and ear discharge.   Respiratory: Negative for cough, shortness of breath, wheezing  Cardiovascular: Negative for chest pain, palpitations and leg swelling.  Gastrointestinal: Negative for heartburn, abdominal pain, vomiting, diarrhea or consitpation Genitourinary: Negative for dysuria, urgency, frequency, hematuria Musculoskeletal: Negative for back pain  Neurological: Negative for dizziness, seizures, syncope, focal weakness,  numbness and headaches.  Hematological: Does not bruise/bleed easily.  Psychiatric/Behavioral: Negative for hallucinations, confusion, dysphoric mood    Tolerating Diet: yes      DRUG ALLERGIES:  No Known Allergies  VITALS:  Blood pressure (!) 149/71, pulse 63, temperature 98.3 F (36.8 C), resp. rate 18, height 5' 4.5" (1.638 m), weight 60.1 kg, SpO2 95 %.  PHYSICAL EXAMINATION:  Constitutional: Appears well-developed and well-nourished. No distress. HENT: Normocephalic. Marland Kitchen. Oropharynx is clear and moist.  Eyes: Conjunctivae and EOM are normal. PERRLA, no scleral icterus.  Neck: Normal ROM. Neck supple. No JVD. No tracheal deviation. CVS: RRR, S1/S2 +, no murmurs, no gallops, no carotid bruit.  Pulmonary: Effort and breath sounds normal, no stridor, rhonchi, wheezes, rales.  Abdominal: Soft. BS +,  no distension, tenderness, rebound or guarding.  Musculoskeletal: right hip pain no bruising pain with movement. No edema and no tenderness.  Neuro: Alert. CN 2-12 grossly intact. No focal deficits. Skin: Skin is warm and dry. No rash noted. Psychiatric: Normal  mood and affect.      LABORATORY PANEL:   CBC Recent Labs  Lab 07/31/19 0544  WBC 11.5*  HGB 11.0*  HCT 31.7*  PLT 235   ------------------------------------------------------------------------------------------------------------------  Chemistries  Recent Labs  Lab 08/03/19 0459  NA 133*  K 3.9  CL 98  CO2 26  GLUCOSE 84  BUN 13  CREATININE 0.75  CALCIUM 9.3  MG 1.6*   ------------------------------------------------------------------------------------------------------------------  Cardiac Enzymes No results for input(s): TROPONINI in the last 168 hours. ------------------------------------------------------------------------------------------------------------------  RADIOLOGY:  Ct Pelvis Wo Contrast  Result Date: 07/29/2019 CLINICAL DATA:  Hip pain EXAM: CT PELVIS WITHOUT CONTRAST TECHNIQUE: Multidetector CT imaging of the pelvis was performed following the standard protocol without intravenous contrast. COMPARISON:  CT dated September 28, 2013.  This is FINDINGS: Urinary Tract: There is diffuse bladder wall thickening. There appears to be at least 1 bladder diverticulum containing a small calcification. There is asymmetric enlargement of the left seminal vesicle, which currently measures approximately 1.8 x 2.9 cm. This is somewhat similar to prior CT in 2014 and therefore likely represents a benign etiology. Bowel:  Unremarkable visualized pelvic bowel loops. Vascular/Lymphatic: There are advanced atherosclerotic changes involving the partially visualized lower abdominal aorta. Reproductive:  The prostate gland is significantly enlarged. Other:  None Musculoskeletal: There is an acute mildly displaced fracture of the right inferior pubic ramus. There are acute nondisplaced fractures through the anterior column of the right acetabulum extending into the roof. There is a small pelvic wall hematoma. There are advanced degenerative changes of the partially visualized lower  lumbar spine, severe at the L4-L5 and L3-L4 levels. IMPRESSION: 1. Acute fractures of the right acetabulum and right inferior pubic ramus as detailed above. 2. Small right-sided pelvic hematoma. 3.  Diffuse bladder wall thickening with at least one bladder diverticulum. Findings are favored to be secondary to chronic outlet obstruction. The prostate gland is significantly enlarged. Aortic Atherosclerosis (ICD10-I70.0). Electronically Signed   By: Constance Holster M.D.   On: 07/29/2019 20:38   Dg Chest Port 1 View  Result Date: 07/29/2019 CLINICAL DATA:  Fall yesterday. Right hip fracture. EXAM: PORTABLE CHEST 1 VIEW COMPARISON:  Radiograph 06/07/2014 FINDINGS: The cardiomediastinal contours are normal. The lungs are clear. Pulmonary vasculature is normal. No consolidation, pleural effusion, or pneumothorax. Multiple left greater than right rib fractures. Fracture of left lateral second rib is age indeterminate. IMPRESSION: Age-indeterminate left lateral second rib fracture, given presence of multiple remote left rib fractures this is likely not acute, however recommend correlation for chest tenderness. No other acute finding. Electronically Signed   By: Keith Rake M.D.   On: 07/29/2019 20:58   Dg Hip Unilat With Pelvis 2-3 Views Right  Result Date: 07/29/2019 CLINICAL DATA:  Fall with hip pain. EXAM: DG HIP (WITH OR WITHOUT PELVIS) 2-3V RIGHT COMPARISON:  None. FINDINGS: There is mild-to-moderate osteoarthritis involving both hips. Phleboliths project over the patient's pelvis. There is no definite displaced fracture or dislocation involving the right femur. There is a subtle irregularity of the right inferior pubic ramus. IMPRESSION: 1. No definite displaced fracture or dislocation involving the right femur. 2. Subtle irregularity of the right inferior pubic ramus may represent a nondisplaced fracture. Electronically Signed   By: Constance Holster M.D.   On: 07/29/2019 19:54     ASSESSMENT AND  PLAN:     78 year old male with tobacco dependence and hyperlipidemia who presented to the emergency room complaining of right hip pain after fall.  1. Acute fractures of the right acetabulum and right inferior pubic ramus: Patient has been evaluated by orthopedic surgery. This is nonoperable. Continue supportive management with physical therapy and pain control.    2.E coli UTI: Continue oral Keflex to complete course of treatment. Stop date 08/07/2019.  3. Hyperlipidemia: Continue Lipitor  4.Electrolyte abnormalities including phosphorus, potassium and magnesium:  Replete PRN   5. Depression: Continue Lexapro  6. Tobacco dependence: Patient is encouraged to quit smoking and willing to attempt to quit was assessed. Patient motivated.Counseling was provided.  Management plans discussed with the patient and he is in agreement.  CODE STATUS: full  TOTAL TIME TAKING CARE OF THIS PATIENT: 24 minutes.    POSSIBLE D/C once we have insurance approval   Bettey Costa M.D on 08/03/2019 at 10:34 AM  Between 7am to 6pm - Pager - [phone] After 6pm go to [email or web address] - password [email or web address] [facility]  Office  [phone]  CC: Primary care physician; Dion Body, MD  Note: This dictation was prepared with Dragon dictation along with smaller phrase technology. Any transcriptional errors that result from this process are unintentional.

## 2019-08-04 DIAGNOSIS — R4189 Other symptoms and signs involving cognitive functions and awareness: Secondary | ICD-10-CM | POA: Diagnosis not present

## 2019-08-04 DIAGNOSIS — S72001A Fracture of unspecified part of neck of right femur, initial encounter for closed fracture: Secondary | ICD-10-CM | POA: Diagnosis not present

## 2019-08-04 DIAGNOSIS — S32401A Unspecified fracture of right acetabulum, initial encounter for closed fracture: Secondary | ICD-10-CM | POA: Diagnosis not present

## 2019-08-04 DIAGNOSIS — Z79899 Other long term (current) drug therapy: Secondary | ICD-10-CM | POA: Diagnosis not present

## 2019-08-04 DIAGNOSIS — Z23 Encounter for immunization: Secondary | ICD-10-CM | POA: Diagnosis not present

## 2019-08-04 DIAGNOSIS — M2550 Pain in unspecified joint: Secondary | ICD-10-CM | POA: Diagnosis not present

## 2019-08-04 DIAGNOSIS — R41841 Cognitive communication deficit: Secondary | ICD-10-CM | POA: Diagnosis not present

## 2019-08-04 DIAGNOSIS — I10 Essential (primary) hypertension: Secondary | ICD-10-CM | POA: Diagnosis not present

## 2019-08-04 DIAGNOSIS — S32591A Other specified fracture of right pubis, initial encounter for closed fracture: Secondary | ICD-10-CM | POA: Diagnosis not present

## 2019-08-04 DIAGNOSIS — B9620 Unspecified Escherichia coli [E. coli] as the cause of diseases classified elsewhere: Secondary | ICD-10-CM | POA: Diagnosis not present

## 2019-08-04 DIAGNOSIS — E538 Deficiency of other specified B group vitamins: Secondary | ICD-10-CM | POA: Diagnosis not present

## 2019-08-04 DIAGNOSIS — E785 Hyperlipidemia, unspecified: Secondary | ICD-10-CM | POA: Diagnosis not present

## 2019-08-04 DIAGNOSIS — N390 Urinary tract infection, site not specified: Secondary | ICD-10-CM | POA: Diagnosis not present

## 2019-08-04 DIAGNOSIS — R1310 Dysphagia, unspecified: Secondary | ICD-10-CM | POA: Diagnosis not present

## 2019-08-04 DIAGNOSIS — E876 Hypokalemia: Secondary | ICD-10-CM | POA: Diagnosis not present

## 2019-08-04 DIAGNOSIS — S72001D Fracture of unspecified part of neck of right femur, subsequent encounter for closed fracture with routine healing: Secondary | ICD-10-CM | POA: Diagnosis not present

## 2019-08-04 DIAGNOSIS — K219 Gastro-esophageal reflux disease without esophagitis: Secondary | ICD-10-CM | POA: Diagnosis not present

## 2019-08-04 DIAGNOSIS — Y92019 Unspecified place in single-family (private) house as the place of occurrence of the external cause: Secondary | ICD-10-CM | POA: Diagnosis not present

## 2019-08-04 DIAGNOSIS — D519 Vitamin B12 deficiency anemia, unspecified: Secondary | ICD-10-CM | POA: Diagnosis not present

## 2019-08-04 DIAGNOSIS — E7849 Other hyperlipidemia: Secondary | ICD-10-CM | POA: Diagnosis not present

## 2019-08-04 DIAGNOSIS — Z7401 Bed confinement status: Secondary | ICD-10-CM | POA: Diagnosis not present

## 2019-08-04 DIAGNOSIS — R531 Weakness: Secondary | ICD-10-CM | POA: Diagnosis not present

## 2019-08-04 DIAGNOSIS — F5102 Adjustment insomnia: Secondary | ICD-10-CM | POA: Diagnosis not present

## 2019-08-04 DIAGNOSIS — F320 Major depressive disorder, single episode, mild: Secondary | ICD-10-CM | POA: Diagnosis not present

## 2019-08-04 DIAGNOSIS — W1830XA Fall on same level, unspecified, initial encounter: Secondary | ICD-10-CM | POA: Diagnosis not present

## 2019-08-04 DIAGNOSIS — M6281 Muscle weakness (generalized): Secondary | ICD-10-CM | POA: Diagnosis not present

## 2019-08-04 DIAGNOSIS — R262 Difficulty in walking, not elsewhere classified: Secondary | ICD-10-CM | POA: Diagnosis not present

## 2019-08-04 DIAGNOSIS — Z03818 Encounter for observation for suspected exposure to other biological agents ruled out: Secondary | ICD-10-CM | POA: Diagnosis not present

## 2019-08-04 DIAGNOSIS — D649 Anemia, unspecified: Secondary | ICD-10-CM | POA: Diagnosis not present

## 2019-08-04 LAB — BASIC METABOLIC PANEL
Anion gap: 9 (ref 5–15)
BUN: 17 mg/dL (ref 8–23)
CO2: 27 mmol/L (ref 22–32)
Calcium: 9.8 mg/dL (ref 8.9–10.3)
Chloride: 96 mmol/L — ABNORMAL LOW (ref 98–111)
Creatinine, Ser: 0.76 mg/dL (ref 0.61–1.24)
GFR calc Af Amer: >60 mL/min (ref >60)
GFR calc non Af Amer: >60 mL/min (ref >60)
Glucose, Bld: 91 mg/dL (ref 70–99)
Potassium: 4.1 mmol/L (ref 3.5–5.1)
Sodium: 132 mmol/L — ABNORMAL LOW (ref 135–145)

## 2019-08-04 LAB — MAGNESIUM: Magnesium: 1.8 mg/dL (ref 1.7–2.4)

## 2019-08-04 LAB — ABO/RH: ABO/RH(D): O POS

## 2019-08-04 LAB — SARS CORONAVIRUS 2 BY RT PCR (HOSPITAL ORDER, PERFORMED IN ~~LOC~~ HOSPITAL LAB): SARS Coronavirus 2: NEGATIVE

## 2019-08-04 MED ORDER — FLEET ENEMA 7-19 GM/118ML RE ENEM: 1.0000 | ENEMA | Freq: Once | RECTAL | Status: DC

## 2019-08-04 MED ORDER — BISACODYL 10 MG RE SUPP: 10.0000 mg | Freq: Every day | RECTAL | Status: DC

## 2019-08-04 NOTE — Progress Notes (Signed)
Pt. Discharged to [facility] via EMS. Report called to [facility] facility. Prescriptions in packet with EMS. Patient assessment unchanged from this morning. IV discontinued per policy.

## 2019-08-04 NOTE — Consult Note (Signed)
PHARMACY CONSULT NOTE - FOLLOW UP  Pharmacy Consult for Electrolyte Monitoring and Replacement   HPI: 78 y/o admitted for right pubic ramus and acetabular fracture after fall. Patient found to have UTI with reported hesitancy on ceftriaxone;  Recent Labs: Potassium (mmol/L)  Date Value  08/04/2019 4.1  10/25/2014 3.3 (L)   Magnesium (mg/dL)  Date Value  08/04/2019 1.8  06/07/2014 2.1   Calcium (mg/dL)  Date Value  08/04/2019 9.8   Calcium, Total (mg/dL)  Date Value  10/25/2014 8.9   Albumin (g/dL)  Date Value  10/25/2014 3.7   Phosphorus (mg/dL)  Date Value  08/01/2019 2.7   Sodium (mmol/L)  Date Value  08/04/2019 132 (L)  10/25/2014 133 (L)     Assessment: Hypokalemia resolved.  Hypomagnesemia resolved.  Goal of Therapy:  Replete electrolytes to normal levels  Plan:  No replenishment needed at this time.   Will continue to f/u with AM labs.  Thank you,  Gerald Dexter, PharmD Pharmacy Resident  08/04/2019 8:09 AM

## 2019-08-04 NOTE — Progress Notes (Signed)
Occupational Therapy Treatment Patient Details Name: Richard Carter MRN: [id number] DOB: 10/28/1941 Today's Date: 08/04/2019    History of present illness 78 y/o male s/p fall at home, c/o R hip pain.   Imaging reveals right pubic ramus and acetabular fractures.  He was also found to have a UTI,   OT comments  Pt. education was provided about A/E use for LE ADLs verbally, and through visual demonstration. Reviewed pt.'s home routines, and pt. was assisted in problem solving. Pt. Reports that he has been having a hard time since his wife passed away last month. Pt. Reports being married for 33 years. Pt. Reports his neighbor has been very helpful to him.  Therapeutic listening was provided. Pt. Continues to benefit from OT services for ADL training, A/E training, and pt. education about home modification, and DME. Pt. is most appropriate from SNF level of care upon discharge, and would benefit from follow-up OT services.   Follow Up Recommendations  SNF;Supervision/Assistance - 24 hour    Equipment Recommendations  3 in 1 bedside commode;Other (comment)    Recommendations for Other Services      Precautions / Restrictions Restrictions Weight Bearing Restrictions: Yes RLE Weight Bearing: Touchdown weight bearing       Mobility Bed Mobility               General bed mobility comments: Min A to reposition upward in bed with use of trapeze  Transfers    Deferred                  Balance                                           ADL either performed or assessed with clinical judgement   ADL Overall ADL's : Needs assistance/impaired Eating/Feeding: Set up;Bed level   Grooming: Minimal assistance;Set up;Bed level   Upper Body Bathing: Set up;Moderate assistance;Bed level   Lower Body Bathing: Set up;Maximal assistance;Bed level   Upper Body Dressing : Set up;Moderate assistance;Bed level   Lower Body Dressing: Set up;Bed level;Maximal  assistance                 General ADL Comments: Pt. education was provided about A/E use for LE ADLs.     Vision Baseline Vision/History: Wears glasses Wears Glasses: At all times Patient Visual Report: No change from baseline     Perception     Praxis      Cognition Arousal/Alertness: Awake/alert Behavior During Therapy: WFL for tasks assessed/performed Overall Cognitive Status: Within Functional Limits for tasks assessed                                          Exercises     Shoulder Instructions       General Comments      Pertinent Vitals/ Pain       Pain Assessment: 0-10 Pain Score: 8  Pain Location: hips/low back Pain Descriptors / Indicators: Constant;Aching;Grimacing Pain Intervention(s): Monitored during session;Repositioned;RN gave pain meds during session  Home Living  Prior Functioning/Environment              Frequency  Min 2X/week        Progress Toward Goals  OT Goals(current goals can now be found in the care plan section)  Progress towards OT goals: OT to reassess next treatment  Acute Rehab OT Goals Patient Stated Goal: To return home OT Goal Formulation: With patient Potential to Achieve Goals: Good  Plan Discharge plan remains appropriate;Frequency remains appropriate    Co-evaluation                 AM-PAC OT "6 Clicks" Daily Activity     Outcome Measure   Help from another person eating meals?: None Help from another person taking care of personal grooming?: A Little Help from another person toileting, which includes using toliet, bedpan, or urinal?: A Lot Help from another person bathing (including washing, rinsing, drying)?: A Lot Help from another person to put on and taking off regular upper body clothing?: A Lot Help from another person to put on and taking off regular lower body clothing?: A Lot 6 Click Score: 15    End of  Session    OT Visit Diagnosis: Other abnormalities of gait and mobility (R26.89);Muscle weakness (generalized) (M62.81);Pain Pain - Right/Left: Right Pain - part of body: Hip   Activity Tolerance Patient limited by pain   Patient Left in bed;with call bell/phone within reach;with bed alarm set;with SCD's reapplied   Nurse Communication          Time: 9735-3299 OT Time Calculation (min): 18 min  Charges: OT General Charges $OT Visit: 1 Visit OT Treatments $Self Care/Home Management : 8-22 mins  Harrel Carina, MS, OTR/L  Harrel Carina 08/04/2019, 9:15 AM

## 2019-08-04 NOTE — TOC Transition Note (Signed)
Transition of Care [facility]) - CM/SW Discharge Note   Patient Details  Name: Richard Carter MRN: [id number] Date of Birth: 09/10/1941  [facility] CM/SW Contact:  Su Hilt, RN Phone Number: 08/04/2019, 10:32 AM   Clinical Narrative:    Patient to DC to [facility] today once Covid results come back Patient will Transport via EMS, Bedside Nurse to call report to [phone] B hall, and call EMS to transport once ready   Final next level of care: Skilled Nursing Facility Barriers to Discharge: Barriers Resolved   Patient Goals and CMS Choice   CMS Medicare.gov Compare Post Acute Care list provided to:: Patient Choice offered to / list presented to : Patient  Discharge Placement                       Discharge Plan and Services   Discharge Planning Services: CM Consult Post Acute Care Choice: [place]                               Social Determinants of Health (SDOH) Interventions     Readmission Risk Interventions No flowsheet data found.

## 2019-08-05 DIAGNOSIS — S32401A Unspecified fracture of right acetabulum, initial encounter for closed fracture: Secondary | ICD-10-CM | POA: Diagnosis not present

## 2019-08-05 DIAGNOSIS — R531 Weakness: Secondary | ICD-10-CM | POA: Diagnosis not present

## 2019-08-05 DIAGNOSIS — F320 Major depressive disorder, single episode, mild: Secondary | ICD-10-CM | POA: Diagnosis not present

## 2019-08-05 DIAGNOSIS — K219 Gastro-esophageal reflux disease without esophagitis: Secondary | ICD-10-CM | POA: Diagnosis not present

## 2019-08-05 DIAGNOSIS — E538 Deficiency of other specified B group vitamins: Secondary | ICD-10-CM | POA: Diagnosis not present

## 2019-08-05 DIAGNOSIS — I10 Essential (primary) hypertension: Secondary | ICD-10-CM | POA: Diagnosis not present

## 2019-08-05 DIAGNOSIS — E7849 Other hyperlipidemia: Secondary | ICD-10-CM | POA: Diagnosis not present

## 2019-08-05 DIAGNOSIS — E876 Hypokalemia: Secondary | ICD-10-CM | POA: Diagnosis not present

## 2019-08-05 DIAGNOSIS — N390 Urinary tract infection, site not specified: Secondary | ICD-10-CM | POA: Diagnosis not present

## 2019-08-10 DIAGNOSIS — N390 Urinary tract infection, site not specified: Secondary | ICD-10-CM | POA: Diagnosis not present

## 2019-08-10 DIAGNOSIS — F5102 Adjustment insomnia: Secondary | ICD-10-CM | POA: Diagnosis not present

## 2019-08-10 DIAGNOSIS — K219 Gastro-esophageal reflux disease without esophagitis: Secondary | ICD-10-CM | POA: Diagnosis not present

## 2019-08-10 DIAGNOSIS — I10 Essential (primary) hypertension: Secondary | ICD-10-CM | POA: Diagnosis not present

## 2019-08-10 DIAGNOSIS — F320 Major depressive disorder, single episode, mild: Secondary | ICD-10-CM | POA: Diagnosis not present

## 2019-08-10 DIAGNOSIS — S32591A Other specified fracture of right pubis, initial encounter for closed fracture: Secondary | ICD-10-CM | POA: Diagnosis not present

## 2019-08-10 DIAGNOSIS — S32401A Unspecified fracture of right acetabulum, initial encounter for closed fracture: Secondary | ICD-10-CM | POA: Diagnosis not present

## 2019-08-10 DIAGNOSIS — E7849 Other hyperlipidemia: Secondary | ICD-10-CM | POA: Diagnosis not present

## 2019-08-11 DIAGNOSIS — Z79899 Other long term (current) drug therapy: Secondary | ICD-10-CM | POA: Diagnosis not present

## 2019-08-11 DIAGNOSIS — D649 Anemia, unspecified: Secondary | ICD-10-CM | POA: Diagnosis not present

## 2019-08-11 DIAGNOSIS — D519 Vitamin B12 deficiency anemia, unspecified: Secondary | ICD-10-CM | POA: Diagnosis not present

## 2019-08-12 DIAGNOSIS — Z03818 Encounter for observation for suspected exposure to other biological agents ruled out: Secondary | ICD-10-CM | POA: Diagnosis not present

## 2019-08-17 DIAGNOSIS — S32591A Other specified fracture of right pubis, initial encounter for closed fracture: Secondary | ICD-10-CM | POA: Diagnosis not present

## 2019-08-17 DIAGNOSIS — F320 Major depressive disorder, single episode, mild: Secondary | ICD-10-CM | POA: Diagnosis not present

## 2019-08-17 DIAGNOSIS — S32401A Unspecified fracture of right acetabulum, initial encounter for closed fracture: Secondary | ICD-10-CM | POA: Diagnosis not present

## 2019-08-19 DIAGNOSIS — Z79899 Other long term (current) drug therapy: Secondary | ICD-10-CM | POA: Diagnosis not present

## 2019-09-02 DIAGNOSIS — S72001A Fracture of unspecified part of neck of right femur, initial encounter for closed fracture: Secondary | ICD-10-CM | POA: Diagnosis not present

## 2019-09-08 DIAGNOSIS — Z03818 Encounter for observation for suspected exposure to other biological agents ruled out: Secondary | ICD-10-CM | POA: Diagnosis not present

## 2019-09-08 DIAGNOSIS — Z79899 Other long term (current) drug therapy: Secondary | ICD-10-CM | POA: Diagnosis not present

## 2019-09-08 DIAGNOSIS — E785 Hyperlipidemia, unspecified: Secondary | ICD-10-CM | POA: Diagnosis not present

## 2019-09-21 ENCOUNTER — Other Ambulatory Visit: Payer: Self-pay

## 2019-09-21 DIAGNOSIS — F320 Major depressive disorder, single episode, mild: Secondary | ICD-10-CM | POA: Diagnosis not present

## 2019-09-21 DIAGNOSIS — F5102 Adjustment insomnia: Secondary | ICD-10-CM | POA: Diagnosis not present

## 2019-09-21 DIAGNOSIS — G8929 Other chronic pain: Secondary | ICD-10-CM | POA: Diagnosis not present

## 2019-09-21 NOTE — Patient Outreach (Signed)
[facility]) Care Management  09/21/2019  WASIF SIMONICH 13-Dec-1940 [id number]   Referral Date: 09/21/2019 Referral Source: [employer] Report Referral Reason: Discharge [facility] 09/18/2019. Right femur fracture.    Outreach Attempt: Telephone call to patient and emergency contact.  HIPAA compliant voice message left.  Telephone call to  [facility]. Patient confirmed to still be inpatient there at this time.    Plan: RN CM will close case.    Jone Baseman, RN, MSN [facility] Care Management Care Management Coordinator Direct Line [phone] Toll Free: [phone]  Fax: [phone]

## 2019-09-22 DIAGNOSIS — Z03818 Encounter for observation for suspected exposure to other biological agents ruled out: Secondary | ICD-10-CM | POA: Diagnosis not present

## 2019-09-29 DIAGNOSIS — Z03818 Encounter for observation for suspected exposure to other biological agents ruled out: Secondary | ICD-10-CM | POA: Diagnosis not present

## 2019-10-05 DIAGNOSIS — S72001D Fracture of unspecified part of neck of right femur, subsequent encounter for closed fracture with routine healing: Secondary | ICD-10-CM | POA: Diagnosis not present

## 2019-10-06 DIAGNOSIS — Z03818 Encounter for observation for suspected exposure to other biological agents ruled out: Secondary | ICD-10-CM | POA: Diagnosis not present

## 2019-10-07 DIAGNOSIS — R269 Unspecified abnormalities of gait and mobility: Secondary | ICD-10-CM | POA: Diagnosis not present

## 2019-10-07 DIAGNOSIS — M6281 Muscle weakness (generalized): Secondary | ICD-10-CM | POA: Diagnosis not present

## 2019-10-07 DIAGNOSIS — R41841 Cognitive communication deficit: Secondary | ICD-10-CM | POA: Diagnosis not present

## 2019-10-08 DIAGNOSIS — R269 Unspecified abnormalities of gait and mobility: Secondary | ICD-10-CM | POA: Diagnosis not present

## 2019-10-08 DIAGNOSIS — R41841 Cognitive communication deficit: Secondary | ICD-10-CM | POA: Diagnosis not present

## 2019-10-08 DIAGNOSIS — M6281 Muscle weakness (generalized): Secondary | ICD-10-CM | POA: Diagnosis not present

## 2019-10-12 DIAGNOSIS — F5102 Adjustment insomnia: Secondary | ICD-10-CM | POA: Diagnosis not present

## 2019-10-12 DIAGNOSIS — E538 Deficiency of other specified B group vitamins: Secondary | ICD-10-CM | POA: Diagnosis not present

## 2019-10-12 DIAGNOSIS — E7849 Other hyperlipidemia: Secondary | ICD-10-CM | POA: Diagnosis not present

## 2019-10-12 DIAGNOSIS — R269 Unspecified abnormalities of gait and mobility: Secondary | ICD-10-CM | POA: Diagnosis not present

## 2019-10-12 DIAGNOSIS — M6281 Muscle weakness (generalized): Secondary | ICD-10-CM | POA: Diagnosis not present

## 2019-10-12 DIAGNOSIS — K219 Gastro-esophageal reflux disease without esophagitis: Secondary | ICD-10-CM | POA: Diagnosis not present

## 2019-10-12 DIAGNOSIS — I10 Essential (primary) hypertension: Secondary | ICD-10-CM | POA: Diagnosis not present

## 2019-10-12 DIAGNOSIS — F320 Major depressive disorder, single episode, mild: Secondary | ICD-10-CM | POA: Diagnosis not present

## 2019-10-12 DIAGNOSIS — R52 Pain, unspecified: Secondary | ICD-10-CM | POA: Diagnosis not present

## 2019-10-12 DIAGNOSIS — R41841 Cognitive communication deficit: Secondary | ICD-10-CM | POA: Diagnosis not present

## 2019-10-12 DIAGNOSIS — E876 Hypokalemia: Secondary | ICD-10-CM | POA: Diagnosis not present

## 2019-10-12 DIAGNOSIS — F17210 Nicotine dependence, cigarettes, uncomplicated: Secondary | ICD-10-CM | POA: Diagnosis not present

## 2019-10-13 DIAGNOSIS — S72001D Fracture of unspecified part of neck of right femur, subsequent encounter for closed fracture with routine healing: Secondary | ICD-10-CM | POA: Diagnosis not present

## 2019-10-19 DIAGNOSIS — S32401D Unspecified fracture of right acetabulum, subsequent encounter for fracture with routine healing: Secondary | ICD-10-CM | POA: Diagnosis not present

## 2019-10-19 DIAGNOSIS — I10 Essential (primary) hypertension: Secondary | ICD-10-CM | POA: Diagnosis not present

## 2019-10-19 DIAGNOSIS — F0390 Unspecified dementia without behavioral disturbance: Secondary | ICD-10-CM | POA: Diagnosis not present

## 2019-10-19 DIAGNOSIS — F17210 Nicotine dependence, cigarettes, uncomplicated: Secondary | ICD-10-CM | POA: Diagnosis not present

## 2019-10-19 DIAGNOSIS — S32591D Other specified fracture of right pubis, subsequent encounter for fracture with routine healing: Secondary | ICD-10-CM | POA: Diagnosis not present

## 2019-10-19 DIAGNOSIS — E785 Hyperlipidemia, unspecified: Secondary | ICD-10-CM | POA: Diagnosis not present

## 2019-10-19 DIAGNOSIS — F329 Major depressive disorder, single episode, unspecified: Secondary | ICD-10-CM | POA: Diagnosis not present

## 2019-10-19 DIAGNOSIS — K219 Gastro-esophageal reflux disease without esophagitis: Secondary | ICD-10-CM | POA: Diagnosis not present

## 2019-10-19 DIAGNOSIS — M1990 Unspecified osteoarthritis, unspecified site: Secondary | ICD-10-CM | POA: Diagnosis not present

## 2019-10-20 DIAGNOSIS — F17210 Nicotine dependence, cigarettes, uncomplicated: Secondary | ICD-10-CM | POA: Diagnosis not present

## 2019-10-20 DIAGNOSIS — S32401D Unspecified fracture of right acetabulum, subsequent encounter for fracture with routine healing: Secondary | ICD-10-CM | POA: Diagnosis not present

## 2019-10-20 DIAGNOSIS — I10 Essential (primary) hypertension: Secondary | ICD-10-CM | POA: Diagnosis not present

## 2019-10-20 DIAGNOSIS — F0390 Unspecified dementia without behavioral disturbance: Secondary | ICD-10-CM | POA: Diagnosis not present

## 2019-10-20 DIAGNOSIS — K219 Gastro-esophageal reflux disease without esophagitis: Secondary | ICD-10-CM | POA: Diagnosis not present

## 2019-10-20 DIAGNOSIS — M1990 Unspecified osteoarthritis, unspecified site: Secondary | ICD-10-CM | POA: Diagnosis not present

## 2019-10-20 DIAGNOSIS — F329 Major depressive disorder, single episode, unspecified: Secondary | ICD-10-CM | POA: Diagnosis not present

## 2019-10-20 DIAGNOSIS — E785 Hyperlipidemia, unspecified: Secondary | ICD-10-CM | POA: Diagnosis not present

## 2019-10-20 DIAGNOSIS — S32591D Other specified fracture of right pubis, subsequent encounter for fracture with routine healing: Secondary | ICD-10-CM | POA: Diagnosis not present

## 2019-10-25 DIAGNOSIS — I10 Essential (primary) hypertension: Secondary | ICD-10-CM | POA: Diagnosis not present

## 2019-10-25 DIAGNOSIS — E785 Hyperlipidemia, unspecified: Secondary | ICD-10-CM | POA: Diagnosis not present

## 2019-10-25 DIAGNOSIS — M1990 Unspecified osteoarthritis, unspecified site: Secondary | ICD-10-CM | POA: Diagnosis not present

## 2019-10-25 DIAGNOSIS — S32591D Other specified fracture of right pubis, subsequent encounter for fracture with routine healing: Secondary | ICD-10-CM | POA: Diagnosis not present

## 2019-10-25 DIAGNOSIS — F0390 Unspecified dementia without behavioral disturbance: Secondary | ICD-10-CM | POA: Diagnosis not present

## 2019-10-25 DIAGNOSIS — K219 Gastro-esophageal reflux disease without esophagitis: Secondary | ICD-10-CM | POA: Diagnosis not present

## 2019-10-25 DIAGNOSIS — S32401D Unspecified fracture of right acetabulum, subsequent encounter for fracture with routine healing: Secondary | ICD-10-CM | POA: Diagnosis not present

## 2019-10-25 DIAGNOSIS — F17210 Nicotine dependence, cigarettes, uncomplicated: Secondary | ICD-10-CM | POA: Diagnosis not present

## 2019-10-25 DIAGNOSIS — F329 Major depressive disorder, single episode, unspecified: Secondary | ICD-10-CM | POA: Diagnosis not present

## 2019-10-26 DIAGNOSIS — S32591D Other specified fracture of right pubis, subsequent encounter for fracture with routine healing: Secondary | ICD-10-CM | POA: Diagnosis not present

## 2019-10-26 DIAGNOSIS — M1990 Unspecified osteoarthritis, unspecified site: Secondary | ICD-10-CM | POA: Diagnosis not present

## 2019-10-26 DIAGNOSIS — E785 Hyperlipidemia, unspecified: Secondary | ICD-10-CM | POA: Diagnosis not present

## 2019-10-26 DIAGNOSIS — I10 Essential (primary) hypertension: Secondary | ICD-10-CM | POA: Diagnosis not present

## 2019-10-26 DIAGNOSIS — K219 Gastro-esophageal reflux disease without esophagitis: Secondary | ICD-10-CM | POA: Diagnosis not present

## 2019-10-26 DIAGNOSIS — S32401D Unspecified fracture of right acetabulum, subsequent encounter for fracture with routine healing: Secondary | ICD-10-CM | POA: Diagnosis not present

## 2019-10-26 DIAGNOSIS — F329 Major depressive disorder, single episode, unspecified: Secondary | ICD-10-CM | POA: Diagnosis not present

## 2019-10-26 DIAGNOSIS — F0390 Unspecified dementia without behavioral disturbance: Secondary | ICD-10-CM | POA: Diagnosis not present

## 2019-10-26 DIAGNOSIS — F17210 Nicotine dependence, cigarettes, uncomplicated: Secondary | ICD-10-CM | POA: Diagnosis not present

## 2019-10-27 DIAGNOSIS — K219 Gastro-esophageal reflux disease without esophagitis: Secondary | ICD-10-CM | POA: Diagnosis not present

## 2019-10-27 DIAGNOSIS — F17210 Nicotine dependence, cigarettes, uncomplicated: Secondary | ICD-10-CM | POA: Diagnosis not present

## 2019-10-27 DIAGNOSIS — S32401D Unspecified fracture of right acetabulum, subsequent encounter for fracture with routine healing: Secondary | ICD-10-CM | POA: Diagnosis not present

## 2019-10-27 DIAGNOSIS — F0390 Unspecified dementia without behavioral disturbance: Secondary | ICD-10-CM | POA: Diagnosis not present

## 2019-10-27 DIAGNOSIS — M1990 Unspecified osteoarthritis, unspecified site: Secondary | ICD-10-CM | POA: Diagnosis not present

## 2019-10-27 DIAGNOSIS — S32591D Other specified fracture of right pubis, subsequent encounter for fracture with routine healing: Secondary | ICD-10-CM | POA: Diagnosis not present

## 2019-10-27 DIAGNOSIS — E785 Hyperlipidemia, unspecified: Secondary | ICD-10-CM | POA: Diagnosis not present

## 2019-10-27 DIAGNOSIS — F329 Major depressive disorder, single episode, unspecified: Secondary | ICD-10-CM | POA: Diagnosis not present

## 2019-10-27 DIAGNOSIS — I10 Essential (primary) hypertension: Secondary | ICD-10-CM | POA: Diagnosis not present

## 2019-10-28 DIAGNOSIS — S32401D Unspecified fracture of right acetabulum, subsequent encounter for fracture with routine healing: Secondary | ICD-10-CM | POA: Diagnosis not present

## 2019-10-28 DIAGNOSIS — M1990 Unspecified osteoarthritis, unspecified site: Secondary | ICD-10-CM | POA: Diagnosis not present

## 2019-10-28 DIAGNOSIS — F17210 Nicotine dependence, cigarettes, uncomplicated: Secondary | ICD-10-CM | POA: Diagnosis not present

## 2019-10-28 DIAGNOSIS — S32591D Other specified fracture of right pubis, subsequent encounter for fracture with routine healing: Secondary | ICD-10-CM | POA: Diagnosis not present

## 2019-10-28 DIAGNOSIS — I10 Essential (primary) hypertension: Secondary | ICD-10-CM | POA: Diagnosis not present

## 2019-10-28 DIAGNOSIS — E785 Hyperlipidemia, unspecified: Secondary | ICD-10-CM | POA: Diagnosis not present

## 2019-10-28 DIAGNOSIS — K219 Gastro-esophageal reflux disease without esophagitis: Secondary | ICD-10-CM | POA: Diagnosis not present

## 2019-10-28 DIAGNOSIS — F329 Major depressive disorder, single episode, unspecified: Secondary | ICD-10-CM | POA: Diagnosis not present

## 2019-10-28 DIAGNOSIS — F0390 Unspecified dementia without behavioral disturbance: Secondary | ICD-10-CM | POA: Diagnosis not present

## 2019-10-29 DIAGNOSIS — Z8781 Personal history of (healed) traumatic fracture: Secondary | ICD-10-CM | POA: Diagnosis not present

## 2019-10-29 DIAGNOSIS — F17200 Nicotine dependence, unspecified, uncomplicated: Secondary | ICD-10-CM | POA: Diagnosis not present

## 2019-10-29 DIAGNOSIS — G309 Alzheimer's disease, unspecified: Secondary | ICD-10-CM | POA: Diagnosis not present

## 2019-10-29 DIAGNOSIS — F0280 Dementia in other diseases classified elsewhere without behavioral disturbance: Secondary | ICD-10-CM | POA: Diagnosis not present

## 2019-10-29 DIAGNOSIS — I10 Essential (primary) hypertension: Secondary | ICD-10-CM | POA: Diagnosis not present

## 2019-11-02 DIAGNOSIS — E785 Hyperlipidemia, unspecified: Secondary | ICD-10-CM | POA: Diagnosis not present

## 2019-11-02 DIAGNOSIS — S32591D Other specified fracture of right pubis, subsequent encounter for fracture with routine healing: Secondary | ICD-10-CM | POA: Diagnosis not present

## 2019-11-02 DIAGNOSIS — F0390 Unspecified dementia without behavioral disturbance: Secondary | ICD-10-CM | POA: Diagnosis not present

## 2019-11-02 DIAGNOSIS — F17210 Nicotine dependence, cigarettes, uncomplicated: Secondary | ICD-10-CM | POA: Diagnosis not present

## 2019-11-02 DIAGNOSIS — M1990 Unspecified osteoarthritis, unspecified site: Secondary | ICD-10-CM | POA: Diagnosis not present

## 2019-11-02 DIAGNOSIS — I10 Essential (primary) hypertension: Secondary | ICD-10-CM | POA: Diagnosis not present

## 2019-11-02 DIAGNOSIS — F329 Major depressive disorder, single episode, unspecified: Secondary | ICD-10-CM | POA: Diagnosis not present

## 2019-11-02 DIAGNOSIS — S32401D Unspecified fracture of right acetabulum, subsequent encounter for fracture with routine healing: Secondary | ICD-10-CM | POA: Diagnosis not present

## 2019-11-02 DIAGNOSIS — K219 Gastro-esophageal reflux disease without esophagitis: Secondary | ICD-10-CM | POA: Diagnosis not present

## 2019-11-03 DIAGNOSIS — S32591A Other specified fracture of right pubis, initial encounter for closed fracture: Secondary | ICD-10-CM | POA: Diagnosis not present

## 2019-11-03 DIAGNOSIS — M25551 Pain in right hip: Secondary | ICD-10-CM | POA: Diagnosis not present

## 2019-11-08 DIAGNOSIS — F17210 Nicotine dependence, cigarettes, uncomplicated: Secondary | ICD-10-CM | POA: Diagnosis not present

## 2019-11-08 DIAGNOSIS — I10 Essential (primary) hypertension: Secondary | ICD-10-CM | POA: Diagnosis not present

## 2019-11-08 DIAGNOSIS — S32401D Unspecified fracture of right acetabulum, subsequent encounter for fracture with routine healing: Secondary | ICD-10-CM | POA: Diagnosis not present

## 2019-11-08 DIAGNOSIS — F329 Major depressive disorder, single episode, unspecified: Secondary | ICD-10-CM | POA: Diagnosis not present

## 2019-11-08 DIAGNOSIS — S32591D Other specified fracture of right pubis, subsequent encounter for fracture with routine healing: Secondary | ICD-10-CM | POA: Diagnosis not present

## 2019-11-08 DIAGNOSIS — F0390 Unspecified dementia without behavioral disturbance: Secondary | ICD-10-CM | POA: Diagnosis not present

## 2019-11-08 DIAGNOSIS — M1990 Unspecified osteoarthritis, unspecified site: Secondary | ICD-10-CM | POA: Diagnosis not present

## 2019-11-08 DIAGNOSIS — E785 Hyperlipidemia, unspecified: Secondary | ICD-10-CM | POA: Diagnosis not present

## 2019-11-08 DIAGNOSIS — K219 Gastro-esophageal reflux disease without esophagitis: Secondary | ICD-10-CM | POA: Diagnosis not present

## 2019-11-11 DIAGNOSIS — S32591D Other specified fracture of right pubis, subsequent encounter for fracture with routine healing: Secondary | ICD-10-CM | POA: Diagnosis not present

## 2019-11-11 DIAGNOSIS — F329 Major depressive disorder, single episode, unspecified: Secondary | ICD-10-CM | POA: Diagnosis not present

## 2019-11-11 DIAGNOSIS — S32401D Unspecified fracture of right acetabulum, subsequent encounter for fracture with routine healing: Secondary | ICD-10-CM | POA: Diagnosis not present

## 2019-11-11 DIAGNOSIS — M1990 Unspecified osteoarthritis, unspecified site: Secondary | ICD-10-CM | POA: Diagnosis not present

## 2019-11-11 DIAGNOSIS — E785 Hyperlipidemia, unspecified: Secondary | ICD-10-CM | POA: Diagnosis not present

## 2019-11-11 DIAGNOSIS — K219 Gastro-esophageal reflux disease without esophagitis: Secondary | ICD-10-CM | POA: Diagnosis not present

## 2019-11-11 DIAGNOSIS — I10 Essential (primary) hypertension: Secondary | ICD-10-CM | POA: Diagnosis not present

## 2019-11-11 DIAGNOSIS — F17210 Nicotine dependence, cigarettes, uncomplicated: Secondary | ICD-10-CM | POA: Diagnosis not present

## 2019-11-11 DIAGNOSIS — F0390 Unspecified dementia without behavioral disturbance: Secondary | ICD-10-CM | POA: Diagnosis not present

## 2019-11-12 DIAGNOSIS — S32401D Unspecified fracture of right acetabulum, subsequent encounter for fracture with routine healing: Secondary | ICD-10-CM | POA: Diagnosis not present

## 2019-11-12 DIAGNOSIS — K219 Gastro-esophageal reflux disease without esophagitis: Secondary | ICD-10-CM | POA: Diagnosis not present

## 2019-11-12 DIAGNOSIS — E785 Hyperlipidemia, unspecified: Secondary | ICD-10-CM | POA: Diagnosis not present

## 2019-11-12 DIAGNOSIS — F17210 Nicotine dependence, cigarettes, uncomplicated: Secondary | ICD-10-CM | POA: Diagnosis not present

## 2019-11-12 DIAGNOSIS — I10 Essential (primary) hypertension: Secondary | ICD-10-CM | POA: Diagnosis not present

## 2019-11-12 DIAGNOSIS — F329 Major depressive disorder, single episode, unspecified: Secondary | ICD-10-CM | POA: Diagnosis not present

## 2019-11-12 DIAGNOSIS — F0390 Unspecified dementia without behavioral disturbance: Secondary | ICD-10-CM | POA: Diagnosis not present

## 2019-11-12 DIAGNOSIS — M1990 Unspecified osteoarthritis, unspecified site: Secondary | ICD-10-CM | POA: Diagnosis not present

## 2019-11-12 DIAGNOSIS — S32591D Other specified fracture of right pubis, subsequent encounter for fracture with routine healing: Secondary | ICD-10-CM | POA: Diagnosis not present

## 2019-11-15 DIAGNOSIS — F17210 Nicotine dependence, cigarettes, uncomplicated: Secondary | ICD-10-CM | POA: Diagnosis not present

## 2019-11-15 DIAGNOSIS — I10 Essential (primary) hypertension: Secondary | ICD-10-CM | POA: Diagnosis not present

## 2019-11-15 DIAGNOSIS — S32401D Unspecified fracture of right acetabulum, subsequent encounter for fracture with routine healing: Secondary | ICD-10-CM | POA: Diagnosis not present

## 2019-11-15 DIAGNOSIS — F0390 Unspecified dementia without behavioral disturbance: Secondary | ICD-10-CM | POA: Diagnosis not present

## 2019-11-15 DIAGNOSIS — S32591D Other specified fracture of right pubis, subsequent encounter for fracture with routine healing: Secondary | ICD-10-CM | POA: Diagnosis not present

## 2019-11-15 DIAGNOSIS — F329 Major depressive disorder, single episode, unspecified: Secondary | ICD-10-CM | POA: Diagnosis not present

## 2019-11-15 DIAGNOSIS — K219 Gastro-esophageal reflux disease without esophagitis: Secondary | ICD-10-CM | POA: Diagnosis not present

## 2019-11-15 DIAGNOSIS — M1990 Unspecified osteoarthritis, unspecified site: Secondary | ICD-10-CM | POA: Diagnosis not present

## 2019-11-15 DIAGNOSIS — E785 Hyperlipidemia, unspecified: Secondary | ICD-10-CM | POA: Diagnosis not present

## 2019-11-23 DIAGNOSIS — Z20828 Contact with and (suspected) exposure to other viral communicable diseases: Secondary | ICD-10-CM | POA: Diagnosis not present

## 2019-12-02 ENCOUNTER — Encounter: Payer: Self-pay | Admitting: Emergency Medicine

## 2019-12-02 ENCOUNTER — Emergency Department: Payer: Medicare HMO

## 2019-12-02 ENCOUNTER — Other Ambulatory Visit: Payer: Self-pay

## 2019-12-02 ENCOUNTER — Emergency Department
Admission: EM | Admit: 2019-12-02 | Discharge: 2019-12-02 | Disposition: A | Discharge status: Home / Self Care | Payer: Medicare HMO | Attending: Student in an Organized Health Care Education/Training Program | Admitting: Student in an Organized Health Care Education/Training Program

## 2019-12-02 DIAGNOSIS — S199XXA Unspecified injury of neck, initial encounter: Secondary | ICD-10-CM | POA: Diagnosis not present

## 2019-12-02 DIAGNOSIS — I10 Essential (primary) hypertension: Secondary | ICD-10-CM | POA: Diagnosis not present

## 2019-12-02 DIAGNOSIS — S32591D Other specified fracture of right pubis, subsequent encounter for fracture with routine healing: Secondary | ICD-10-CM | POA: Diagnosis not present

## 2019-12-02 DIAGNOSIS — E785 Hyperlipidemia, unspecified: Secondary | ICD-10-CM | POA: Diagnosis not present

## 2019-12-02 DIAGNOSIS — Y999 Unspecified external cause status: Secondary | ICD-10-CM | POA: Diagnosis not present

## 2019-12-02 DIAGNOSIS — Y9389 Activity, other specified: Secondary | ICD-10-CM | POA: Insufficient documentation

## 2019-12-02 DIAGNOSIS — F17200 Nicotine dependence, unspecified, uncomplicated: Secondary | ICD-10-CM | POA: Insufficient documentation

## 2019-12-02 DIAGNOSIS — F0390 Unspecified dementia without behavioral disturbance: Secondary | ICD-10-CM | POA: Diagnosis not present

## 2019-12-02 DIAGNOSIS — M1990 Unspecified osteoarthritis, unspecified site: Secondary | ICD-10-CM | POA: Diagnosis not present

## 2019-12-02 DIAGNOSIS — F17210 Nicotine dependence, cigarettes, uncomplicated: Secondary | ICD-10-CM | POA: Diagnosis not present

## 2019-12-02 DIAGNOSIS — F329 Major depressive disorder, single episode, unspecified: Secondary | ICD-10-CM | POA: Diagnosis not present

## 2019-12-02 DIAGNOSIS — S0993XA Unspecified injury of face, initial encounter: Secondary | ICD-10-CM | POA: Diagnosis present

## 2019-12-02 DIAGNOSIS — S01112A Laceration without foreign body of left eyelid and periocular area, initial encounter: Secondary | ICD-10-CM | POA: Diagnosis not present

## 2019-12-02 DIAGNOSIS — W06XXXA Fall from bed, initial encounter: Secondary | ICD-10-CM | POA: Insufficient documentation

## 2019-12-02 DIAGNOSIS — Z23 Encounter for immunization: Secondary | ICD-10-CM | POA: Diagnosis not present

## 2019-12-02 DIAGNOSIS — Z79899 Other long term (current) drug therapy: Secondary | ICD-10-CM | POA: Insufficient documentation

## 2019-12-02 DIAGNOSIS — S0990XA Unspecified injury of head, initial encounter: Secondary | ICD-10-CM | POA: Diagnosis not present

## 2019-12-02 DIAGNOSIS — K219 Gastro-esophageal reflux disease without esophagitis: Secondary | ICD-10-CM | POA: Diagnosis not present

## 2019-12-02 DIAGNOSIS — Y92003 Bedroom of unspecified non-institutional (private) residence as the place of occurrence of the external cause: Secondary | ICD-10-CM | POA: Insufficient documentation

## 2019-12-02 DIAGNOSIS — S32401D Unspecified fracture of right acetabulum, subsequent encounter for fracture with routine healing: Secondary | ICD-10-CM | POA: Diagnosis not present

## 2019-12-02 HISTORY — DX: Unspecified dementia, unspecified severity, without behavioral disturbance, psychotic disturbance, mood disturbance, and anxiety: F03.90

## 2019-12-02 MED ORDER — TETANUS-DIPHTH-ACELL PERTUSSIS 5-2.5-18.5 LF-MCG/0.5 IM SUSP
0.5000 mL | Freq: Once | INTRAMUSCULAR | Status: AC
  Administered 2019-12-02: 0.5 mL via INTRAMUSCULAR
  Filled 2019-12-02: qty 0.5

## 2019-12-02 MED ORDER — LIDOCAINE-EPINEPHRINE-TETRACAINE (LET) TOPICAL GEL
3.0000 mL | Freq: Once | TOPICAL | Status: AC
  Administered 2019-12-02: 3 mL via TOPICAL

## 2019-12-02 NOTE — ED Provider Notes (Signed)
[facility] Emergency Department Provider Note    First MD Initiated Contact with Patient 12/02/19 1403     (approximate)  I have reviewed the triage vital signs and the nursing notes.   HISTORY  Chief Complaint Head Injury    HPI Richard Carter is a 79 y.o. male bolus past medical history presents to the ER for evaluation of laceration the left eyebrow and minor head injury occurred yesterday evening.  Patient was reportedly laying in bed rolled over but rolled over to floor hitting his forehead on the bedside table.  No LOC.  Denies any other pain.  No numbness or tingling.    Past Medical History:  Diagnosis Date  . Alcohol abuse   . Dementia ([place])   . GERD (gastroesophageal reflux disease)   . HLD (hyperlipidemia)   . HTN (hypertension)    Family History  Problem Relation Age of Onset  . Stroke Mother   . Cancer Brother    Past Surgical History:  Procedure Laterality Date  . skin cyst removal     Patient Active Problem List   Diagnosis Date Noted  . Pubic ramus fracture ([place]) 07/29/2019  . Acetabular fracture ([place]) 07/29/2019  . HLD (hyperlipidemia) 07/29/2019  . GERD (gastroesophageal reflux disease) 07/29/2019  . UTI (urinary tract infection) 07/29/2019      Prior to Admission medications   Medication Sig Start Date End Date Taking? Authorizing Provider  atorvastatin (LIPITOR) 20 MG tablet Take 20 mg by mouth at bedtime. 07/09/19   [provider]  escitalopram (LEXAPRO) 10 MG tablet Take 10 mg by mouth daily. 07/13/19   [provider]  omeprazole (PRILOSEC) 40 MG capsule Take 40 mg by mouth 2 (two) times daily. 05/03/19   [provider]  oxyCODONE (OXY IR/ROXICODONE) 5 MG immediate release tablet Take 1 tablet (5 mg total) by mouth every 4 (four) hours as needed for moderate pain. 08/01/19   Bettey Costa, MD  traZODone (DESYREL) 100 MG tablet Take 200 mg by mouth at bedtime as needed for sleep.  05/03/19    [provider]    Allergies Patient has no known allergies.    Social History Social History   Tobacco Use  . Smoking status: Current Every Day Smoker  . Smokeless tobacco: Never Used  Substance Use Topics  . Alcohol use: No  . Drug use: Not on file    Review of Systems Patient denies headaches, rhinorrhea, blurry vision, numbness, shortness of breath, chest pain, edema, cough, abdominal pain, nausea, vomiting, diarrhea, dysuria, fevers, rashes or hallucinations unless otherwise stated above in HPI. ____________________________________________   PHYSICAL EXAM:  VITAL SIGNS: Vitals:   12/02/19 1319  BP: (!) 143/93  Pulse: 73  Resp: 14  Temp: 99.2 F (37.3 C)  SpO2: 96%    Constitutional: Alert and oriented.  Eyes: Conjunctivae are normal. EOMI, no proptosis  Head: 1.5cm laceration left medial eyebrow.  No FB Nose: No congestion/rhinnorhea. Mouth/Throat: Mucous membranes are moist.   Neck: No stridor. Painless ROM.  Cardiovascular: Normal rate, regular rhythm. Grossly normal heart sounds.  Good peripheral circulation. Respiratory: Normal respiratory effort.  No retractions. Lungs CTAB. Gastrointestinal: Soft and nontender. No distention. No abdominal bruits. No CVA tenderness. Genitourinary:  Musculoskeletal: No lower extremity tenderness nor edema.  No joint effusions. Neurologic:  Normal speech and language. No gross focal neurologic deficits are appreciated. No facial droop Skin:  Skin is warm, dry and intact. No rash noted. Psychiatric: Mood and affect  are normal. Speech and behavior are normal.  ____________________________________________   LABS (all labs ordered are listed, but only abnormal results are displayed)  No results found for this or any previous visit (from the past 24  hour(s)). ____________________________________________ ____________________________________________  RADIOLOGY   ____________________________________________   PROCEDURES  Procedure(s) performed:  Marland KitchenMarland KitchenLaceration Repair  Date/Time: 12/02/2019 3:51 PM Performed by: Willy Eddy, MD Authorized by: Willy Eddy, MD   Consent:    Consent obtained:  Verbal   Consent given by:  Patient   Risks discussed:  Infection, pain, retained foreign body, poor cosmetic result and poor wound healing Anesthesia (see MAR for exact dosages):    Anesthesia method:  Local infiltration and topical application   Topical anesthetic:  LET Laceration details:    Location:  Face   Face location:  L eyebrow   Length (cm):  1.5   Depth (mm):  2 Repair type:    Repair type:  Simple Exploration:    Hemostasis achieved with:  Direct pressure   Wound exploration: entire depth of wound probed and visualized     Contaminated: no   Treatment:    Area cleansed with:  Saline and Betadine   Amount of cleaning:  Extensive   Irrigation solution:  Sterile saline   Visualized foreign bodies/material removed: no   Mucous membrane repair:    Suture size:  6-0   Suture material:  Vicryl   Suture technique:  Simple interrupted   Number of sutures:  1 Skin repair:    Repair method:  Sutures   Suture size:  5-0   Suture technique:  Simple interrupted   Number of sutures:  2 Approximation:    Approximation:  Close Post-procedure details:    Dressing:  Sterile dressing   Patient tolerance of procedure:  Tolerated well, no immediate complications      Critical Care performed: no ____________________________________________   INITIAL IMPRESSION / ASSESSMENT AND PLAN / ED COURSE  Pertinent labs & imaging results that were available during my care of the patient were reviewed by me and considered in my medical decision making (see chart for details).   DDX: sdh, iph, fracture, laceration, abrasion,  contusion  Richard Carter is a 79 y.o. who presents to the ED with minor head injury and laceration as described above.  Laceration repaired.  Imaging without any evidence of acute injury.  Patient lives with exceedingly well-appearing in no acute distress.  No other injuries reported.  Stable appropriate for outpatient follow-up.     The patient was evaluated in Emergency Department today for the symptoms described in the history of present illness. He/she was evaluated in the context of the global COVID-19 pandemic, which necessitated consideration that the patient might be at risk for infection with the SARS-CoV-2 virus that causes COVID-19. Institutional protocols and algorithms that pertain to the evaluation of patients at risk for COVID-19 are in a state of rapid change based on information released by regulatory bodies including the CDC and federal and state organizations. These policies and algorithms were followed during the patient's care in the ED.  As part of my medical decision making, I reviewed the following data within the electronic MEDICAL RECORD NUMBER Nursing notes reviewed and incorporated, Labs reviewed, notes from prior ED visits and [place] Controlled Substance Database   ____________________________________________   FINAL CLINICAL IMPRESSION(S) / ED DIAGNOSES  Final diagnoses:  Minor head injury, initial encounter  Laceration of left eyebrow, initial encounter      NEW MEDICATIONS  STARTED DURING THIS VISIT:  New Prescriptions   No medications on file     Note:  This document was prepared using Dragon voice recognition software and may include unintentional dictation errors.    Willy Eddy, MD 12/02/19 [phone]

## 2019-12-02 NOTE — ED Triage Notes (Signed)
Arrives from [facility] for evaluation of head injury.  With caregiver, who states patient rolled out of bed yesterday and hit left head and hit a nightstand .  No LOC.  Patient is AAOx3 -- to baseline.  Has history of dementia.  NAD

## 2019-12-02 NOTE — ED Notes (Signed)
Patient transported to CT 

## 2019-12-08 DIAGNOSIS — I10 Essential (primary) hypertension: Secondary | ICD-10-CM | POA: Diagnosis not present

## 2019-12-08 DIAGNOSIS — F0390 Unspecified dementia without behavioral disturbance: Secondary | ICD-10-CM | POA: Diagnosis not present

## 2019-12-08 DIAGNOSIS — S32401D Unspecified fracture of right acetabulum, subsequent encounter for fracture with routine healing: Secondary | ICD-10-CM | POA: Diagnosis not present

## 2019-12-08 DIAGNOSIS — F329 Major depressive disorder, single episode, unspecified: Secondary | ICD-10-CM | POA: Diagnosis not present

## 2019-12-08 DIAGNOSIS — M1990 Unspecified osteoarthritis, unspecified site: Secondary | ICD-10-CM | POA: Diagnosis not present

## 2019-12-08 DIAGNOSIS — K219 Gastro-esophageal reflux disease without esophagitis: Secondary | ICD-10-CM | POA: Diagnosis not present

## 2019-12-08 DIAGNOSIS — F17210 Nicotine dependence, cigarettes, uncomplicated: Secondary | ICD-10-CM | POA: Diagnosis not present

## 2019-12-08 DIAGNOSIS — S32591D Other specified fracture of right pubis, subsequent encounter for fracture with routine healing: Secondary | ICD-10-CM | POA: Diagnosis not present

## 2019-12-08 DIAGNOSIS — E785 Hyperlipidemia, unspecified: Secondary | ICD-10-CM | POA: Diagnosis not present

## 2019-12-09 DIAGNOSIS — F0280 Dementia in other diseases classified elsewhere without behavioral disturbance: Secondary | ICD-10-CM | POA: Diagnosis not present

## 2019-12-09 DIAGNOSIS — Z4802 Encounter for removal of sutures: Secondary | ICD-10-CM | POA: Diagnosis not present

## 2019-12-09 DIAGNOSIS — S0990XD Unspecified injury of head, subsequent encounter: Secondary | ICD-10-CM | POA: Diagnosis not present

## 2019-12-09 DIAGNOSIS — G309 Alzheimer's disease, unspecified: Secondary | ICD-10-CM | POA: Diagnosis not present

## 2019-12-13 DIAGNOSIS — F329 Major depressive disorder, single episode, unspecified: Secondary | ICD-10-CM | POA: Diagnosis not present

## 2019-12-13 DIAGNOSIS — M1990 Unspecified osteoarthritis, unspecified site: Secondary | ICD-10-CM | POA: Diagnosis not present

## 2019-12-13 DIAGNOSIS — E785 Hyperlipidemia, unspecified: Secondary | ICD-10-CM | POA: Diagnosis not present

## 2019-12-13 DIAGNOSIS — I10 Essential (primary) hypertension: Secondary | ICD-10-CM | POA: Diagnosis not present

## 2019-12-13 DIAGNOSIS — S32591D Other specified fracture of right pubis, subsequent encounter for fracture with routine healing: Secondary | ICD-10-CM | POA: Diagnosis not present

## 2019-12-13 DIAGNOSIS — S32401D Unspecified fracture of right acetabulum, subsequent encounter for fracture with routine healing: Secondary | ICD-10-CM | POA: Diagnosis not present

## 2019-12-13 DIAGNOSIS — F0390 Unspecified dementia without behavioral disturbance: Secondary | ICD-10-CM | POA: Diagnosis not present

## 2019-12-13 DIAGNOSIS — F17210 Nicotine dependence, cigarettes, uncomplicated: Secondary | ICD-10-CM | POA: Diagnosis not present

## 2019-12-13 DIAGNOSIS — K219 Gastro-esophageal reflux disease without esophagitis: Secondary | ICD-10-CM | POA: Diagnosis not present

## 2020-02-02 DIAGNOSIS — R05 Cough: Secondary | ICD-10-CM | POA: Diagnosis not present

## 2020-02-02 DIAGNOSIS — Z8781 Personal history of (healed) traumatic fracture: Secondary | ICD-10-CM | POA: Diagnosis not present

## 2020-02-02 DIAGNOSIS — G309 Alzheimer's disease, unspecified: Secondary | ICD-10-CM | POA: Diagnosis not present

## 2020-02-02 DIAGNOSIS — F0280 Dementia in other diseases classified elsewhere without behavioral disturbance: Secondary | ICD-10-CM | POA: Diagnosis not present

## 2020-02-02 DIAGNOSIS — F17210 Nicotine dependence, cigarettes, uncomplicated: Secondary | ICD-10-CM | POA: Diagnosis not present

## 2020-02-02 DIAGNOSIS — I10 Essential (primary) hypertension: Secondary | ICD-10-CM | POA: Diagnosis not present

## 2020-02-22 ENCOUNTER — Inpatient Hospital Stay
Admission: EM | Admit: 2020-02-22 | Discharge: 2020-02-24 | DRG: 101 | Disposition: A | Discharge status: Home / Self Care | Payer: Medicare HMO | Attending: Family Medicine | Admitting: Family Medicine

## 2020-02-22 ENCOUNTER — Other Ambulatory Visit: Payer: Self-pay

## 2020-02-22 ENCOUNTER — Emergency Department: Payer: Medicare HMO

## 2020-02-22 DIAGNOSIS — G4089 Other seizures: Principal | ICD-10-CM | POA: Diagnosis present

## 2020-02-22 DIAGNOSIS — R569 Unspecified convulsions: Secondary | ICD-10-CM

## 2020-02-22 DIAGNOSIS — F0390 Unspecified dementia without behavioral disturbance: Secondary | ICD-10-CM | POA: Diagnosis not present

## 2020-02-22 DIAGNOSIS — E785 Hyperlipidemia, unspecified: Secondary | ICD-10-CM | POA: Diagnosis not present

## 2020-02-22 DIAGNOSIS — Z20822 Contact with and (suspected) exposure to covid-19: Secondary | ICD-10-CM | POA: Diagnosis present

## 2020-02-22 DIAGNOSIS — Y901 Blood alcohol level of 20-39 mg/100 ml: Secondary | ICD-10-CM | POA: Diagnosis present

## 2020-02-22 DIAGNOSIS — R4020 Unspecified coma: Secondary | ICD-10-CM | POA: Diagnosis not present

## 2020-02-22 DIAGNOSIS — N179 Acute kidney failure, unspecified: Secondary | ICD-10-CM | POA: Diagnosis not present

## 2020-02-22 DIAGNOSIS — F0391 Unspecified dementia with behavioral disturbance: Secondary | ICD-10-CM | POA: Diagnosis not present

## 2020-02-22 DIAGNOSIS — R0902 Hypoxemia: Secondary | ICD-10-CM | POA: Diagnosis not present

## 2020-02-22 DIAGNOSIS — E876 Hypokalemia: Secondary | ICD-10-CM | POA: Diagnosis not present

## 2020-02-22 DIAGNOSIS — F10239 Alcohol dependence with withdrawal, unspecified: Secondary | ICD-10-CM

## 2020-02-22 DIAGNOSIS — I10 Essential (primary) hypertension: Secondary | ICD-10-CM | POA: Diagnosis not present

## 2020-02-22 DIAGNOSIS — K30 Functional dyspepsia: Secondary | ICD-10-CM | POA: Diagnosis present

## 2020-02-22 DIAGNOSIS — R8281 Pyuria: Secondary | ICD-10-CM | POA: Diagnosis present

## 2020-02-22 DIAGNOSIS — R404 Transient alteration of awareness: Secondary | ICD-10-CM | POA: Diagnosis not present

## 2020-02-22 DIAGNOSIS — K219 Gastro-esophageal reflux disease without esophagitis: Secondary | ICD-10-CM | POA: Diagnosis present

## 2020-02-22 DIAGNOSIS — F17200 Nicotine dependence, unspecified, uncomplicated: Secondary | ICD-10-CM | POA: Diagnosis present

## 2020-02-22 DIAGNOSIS — F329 Major depressive disorder, single episode, unspecified: Secondary | ICD-10-CM

## 2020-02-22 DIAGNOSIS — Z79899 Other long term (current) drug therapy: Secondary | ICD-10-CM

## 2020-02-22 DIAGNOSIS — Z03818 Encounter for observation for suspected exposure to other biological agents ruled out: Secondary | ICD-10-CM | POA: Diagnosis not present

## 2020-02-22 DIAGNOSIS — R410 Disorientation, unspecified: Secondary | ICD-10-CM | POA: Diagnosis not present

## 2020-02-22 DIAGNOSIS — F1020 Alcohol dependence, uncomplicated: Secondary | ICD-10-CM | POA: Diagnosis not present

## 2020-02-22 DIAGNOSIS — Z823 Family history of stroke: Secondary | ICD-10-CM | POA: Diagnosis not present

## 2020-02-22 DIAGNOSIS — Z66 Do not resuscitate: Secondary | ICD-10-CM | POA: Diagnosis not present

## 2020-02-22 DIAGNOSIS — R0681 Apnea, not elsewhere classified: Secondary | ICD-10-CM | POA: Diagnosis not present

## 2020-02-22 LAB — COMPREHENSIVE METABOLIC PANEL
ALT: 11 U/L (ref 0–44)
AST: 21 U/L (ref 15–41)
Albumin: 3 g/dL — ABNORMAL LOW (ref 3.5–5.0)
Alkaline Phosphatase: 55 U/L (ref 38–126)
Anion gap: 16 — ABNORMAL HIGH (ref 5–15)
BUN: 13 mg/dL (ref 8–23)
CO2: 22 mmol/L (ref 22–32)
Calcium: 8.3 mg/dL — ABNORMAL LOW (ref 8.9–10.3)
Chloride: 104 mmol/L (ref 98–111)
Creatinine, Ser: 1.3 mg/dL — ABNORMAL HIGH (ref 0.61–1.24)
GFR calc Af Amer: 60 mL/min — ABNORMAL LOW (ref >60)
GFR calc non Af Amer: 52 mL/min — ABNORMAL LOW (ref >60)
Glucose, Bld: 93 mg/dL (ref 70–99)
Potassium: 2.5 mmol/L — CL (ref 3.5–5.1)
Sodium: 142 mmol/L (ref 135–145)
Total Bilirubin: 0.7 mg/dL (ref 0.3–1.2)
Total Protein: 5.7 g/dL — ABNORMAL LOW (ref 6.5–8.1)

## 2020-02-22 LAB — URINE DRUG SCREEN, QUALITATIVE (ARMC ONLY)
Amphetamines, Ur Screen: NOT DETECTED
Barbiturates, Ur Screen: NOT DETECTED
Benzodiazepine, Ur Scrn: POSITIVE — AB
Cannabinoid 50 Ng, Ur ~~LOC~~: NOT DETECTED
Cocaine Metabolite,Ur ~~LOC~~: NOT DETECTED
MDMA (Ecstasy)Ur Screen: NOT DETECTED
Methadone Scn, Ur: NOT DETECTED
Opiate, Ur Screen: NOT DETECTED
Phencyclidine (PCP) Ur S: NOT DETECTED
Tricyclic, Ur Screen: NOT DETECTED

## 2020-02-22 LAB — CBC WITH DIFFERENTIAL/PLATELET
Abs Immature Granulocytes: 0.04 10*3/uL (ref 0.00–0.07)
Basophils Absolute: 0 10*3/uL (ref 0.0–0.1)
Basophils Relative: 0 %
Eosinophils Absolute: 0.3 10*3/uL (ref 0.0–0.5)
Eosinophils Relative: 4 %
HCT: 36.4 % — ABNORMAL LOW (ref 39.0–52.0)
Hemoglobin: 12.5 g/dL — ABNORMAL LOW (ref 13.0–17.0)
Immature Granulocytes: 1 %
Lymphocytes Relative: 24 %
Lymphs Abs: 1.9 10*3/uL (ref 0.7–4.0)
MCH: 33 pg (ref 26.0–34.0)
MCHC: 34.3 g/dL (ref 30.0–36.0)
MCV: 96 fL (ref 80.0–100.0)
Monocytes Absolute: 0.8 10*3/uL (ref 0.1–1.0)
Monocytes Relative: 9 %
Neutro Abs: 5.1 10*3/uL (ref 1.7–7.7)
Neutrophils Relative %: 62 %
Platelets: 222 10*3/uL (ref 150–400)
RBC: 3.79 MIL/uL — ABNORMAL LOW (ref 4.22–5.81)
RDW: 18 % — ABNORMAL HIGH (ref 11.5–15.5)
WBC: 8.2 10*3/uL (ref 4.0–10.5)
nRBC: 0 % (ref 0.0–0.2)

## 2020-02-22 LAB — URINALYSIS, COMPLETE (UACMP) WITH MICROSCOPIC
Bilirubin Urine: NEGATIVE
Glucose, UA: NEGATIVE mg/dL
Hgb urine dipstick: NEGATIVE
Ketones, ur: NEGATIVE mg/dL
Nitrite: NEGATIVE
Protein, ur: 100 mg/dL — AB
Specific Gravity, Urine: 1.018 (ref 1.005–1.030)
WBC, UA: >50 WBC/hpf — ABNORMAL HIGH (ref 0–5)
pH: 5 (ref 5.0–8.0)

## 2020-02-22 LAB — ACETAMINOPHEN LEVEL: Acetaminophen (Tylenol), Serum: <10 ug/mL — ABNORMAL LOW (ref 10–30)

## 2020-02-22 LAB — MAGNESIUM: Magnesium: 1.5 mg/dL — ABNORMAL LOW (ref 1.7–2.4)

## 2020-02-22 LAB — SALICYLATE LEVEL: Salicylate Lvl: <7 mg/dL — ABNORMAL LOW (ref 7.0–30.0)

## 2020-02-22 LAB — ETHANOL: Alcohol, Ethyl (B): 31 mg/dL — ABNORMAL HIGH (ref <10)

## 2020-02-22 MED ORDER — ALUM & MAG HYDROXIDE-SIMETH 200-200-20 MG/5ML PO SUSP
30.0000 mL | Freq: Once | ORAL | Status: AC
  Administered 2020-02-22: 30 mL via ORAL
  Filled 2020-02-22: qty 30

## 2020-02-22 MED ORDER — ESCITALOPRAM OXALATE 10 MG PO TABS
10.0000 mg | ORAL_TABLET | Freq: Every day | ORAL | Status: DC
  Administered 2020-02-23 – 2020-02-24 (×2): 10 mg via ORAL
  Filled 2020-02-22 (×2): qty 1

## 2020-02-22 MED ORDER — LOPERAMIDE HCL 2 MG PO CAPS
2.0000 mg | ORAL_CAPSULE | Freq: Once | ORAL | Status: AC
  Administered 2020-02-23: 01:00:00 2 mg via ORAL
  Filled 2020-02-22: qty 1

## 2020-02-22 MED ORDER — ACETAMINOPHEN 325 MG PO TABS: 650.0000 mg | ORAL_TABLET | ORAL | Status: DC | PRN

## 2020-02-22 MED ORDER — SODIUM CHLORIDE 0.9 % IV BOLUS
1000.0000 mL | Freq: Once | INTRAVENOUS | Status: AC
  Administered 2020-02-22: 1000 mL via INTRAVENOUS

## 2020-02-22 MED ORDER — SODIUM CHLORIDE 0.9 % IV SOLN
75.0000 mL/h | INTRAVENOUS | Status: DC
  Administered 2020-02-22 – 2020-02-23 (×2): 75 mL/h via INTRAVENOUS

## 2020-02-22 MED ORDER — LORAZEPAM 2 MG PO TABS: 0.0000 mg | ORAL_TABLET | Freq: Four times a day (QID) | ORAL | Status: AC

## 2020-02-22 MED ORDER — ALUM & MAG HYDROXIDE-SIMETH 200-200-20 MG/5ML PO SUSP: 15.0000 mL | ORAL | Status: DC | PRN

## 2020-02-22 MED ORDER — THIAMINE HCL 100 MG PO TABS
100.0000 mg | ORAL_TABLET | Freq: Every day | ORAL | Status: DC
  Administered 2020-02-23 – 2020-02-24 (×2): 100 mg via ORAL
  Filled 2020-02-22 (×2): qty 1

## 2020-02-22 MED ORDER — ONDANSETRON HCL 4 MG PO TABS: 4.0000 mg | ORAL_TABLET | Freq: Four times a day (QID) | ORAL | Status: DC | PRN

## 2020-02-22 MED ORDER — ENOXAPARIN SODIUM 40 MG/0.4ML ~~LOC~~ SOLN
40.0000 mg | SUBCUTANEOUS | Status: DC
  Administered 2020-02-23 (×2): 40 mg via SUBCUTANEOUS
  Filled 2020-02-22 (×2): qty 0.4

## 2020-02-22 MED ORDER — LORAZEPAM 2 MG/ML IJ SOLN: 1.0000 mg | INTRAMUSCULAR | Status: DC | PRN

## 2020-02-22 MED ORDER — THIAMINE HCL 100 MG/ML IJ SOLN
100.0000 mg | Freq: Once | INTRAMUSCULAR | Status: AC
  Administered 2020-02-22: 100 mg via INTRAVENOUS
  Filled 2020-02-22: qty 2

## 2020-02-22 MED ORDER — SODIUM CHLORIDE 0.9 % IV SOLN
1.0000 g | Freq: Once | INTRAVENOUS | Status: DC
  Filled 2020-02-22: qty 10

## 2020-02-22 MED ORDER — PANTOPRAZOLE SODIUM 40 MG PO TBEC
40.0000 mg | DELAYED_RELEASE_TABLET | Freq: Every day | ORAL | Status: DC
  Administered 2020-02-23 – 2020-02-24 (×2): 40 mg via ORAL
  Filled 2020-02-22 (×2): qty 1

## 2020-02-22 MED ORDER — LORAZEPAM 2 MG/ML IJ SOLN: 0.0000 mg | Freq: Two times a day (BID) | INTRAMUSCULAR | Status: DC

## 2020-02-22 MED ORDER — SODIUM CHLORIDE 0.9 % IV SOLN
1.0000 mg | Freq: Once | INTRAVENOUS | Status: AC
  Administered 2020-02-22: 1 mg via INTRAVENOUS
  Filled 2020-02-22: qty 0.2

## 2020-02-22 MED ORDER — LORAZEPAM 2 MG/ML IJ SOLN
0.0000 mg | Freq: Four times a day (QID) | INTRAMUSCULAR | Status: AC
  Filled 2020-02-22: qty 1

## 2020-02-22 MED ORDER — PANTOPRAZOLE SODIUM 40 MG PO TBEC
40.0000 mg | DELAYED_RELEASE_TABLET | Freq: Once | ORAL | Status: AC
  Administered 2020-02-22: 40 mg via ORAL
  Filled 2020-02-22: qty 1

## 2020-02-22 MED ORDER — TRAZODONE HCL 50 MG PO TABS
200.0000 mg | ORAL_TABLET | Freq: Every day | ORAL | Status: DC
  Administered 2020-02-23 (×2): 200 mg via ORAL
  Filled 2020-02-22 (×2): qty 4

## 2020-02-22 MED ORDER — LORAZEPAM 2 MG PO TABS: 0.0000 mg | ORAL_TABLET | Freq: Two times a day (BID) | ORAL | Status: DC

## 2020-02-22 MED ORDER — ACETAMINOPHEN 650 MG RE SUPP: 650.0000 mg | RECTAL | Status: DC | PRN

## 2020-02-22 MED ORDER — MAGNESIUM OXIDE 400 (241.3 MG) MG PO TABS
400.0000 mg | ORAL_TABLET | Freq: Once | ORAL | Status: AC
  Administered 2020-02-22: 400 mg via ORAL
  Filled 2020-02-22: qty 1

## 2020-02-22 MED ORDER — ONDANSETRON HCL 4 MG/2ML IJ SOLN: 4.0000 mg | Freq: Four times a day (QID) | INTRAMUSCULAR | Status: DC | PRN

## 2020-02-22 MED ORDER — POTASSIUM CHLORIDE CRYS ER 20 MEQ PO TBCR
40.0000 meq | EXTENDED_RELEASE_TABLET | Freq: Once | ORAL | Status: AC
  Administered 2020-02-22: 40 meq via ORAL
  Filled 2020-02-22: qty 2

## 2020-02-22 MED ORDER — ADULT MULTIVITAMIN W/MINERALS CH
1.0000 | ORAL_TABLET | Freq: Every day | ORAL | Status: DC
  Administered 2020-02-22 – 2020-02-24 (×3): 1 via ORAL
  Filled 2020-02-22 (×3): qty 1

## 2020-02-22 MED ORDER — DONEPEZIL HCL 5 MG PO TABS
10.0000 mg | ORAL_TABLET | Freq: Every day | ORAL | Status: DC
  Administered 2020-02-23 (×2): 10 mg via ORAL
  Filled 2020-02-22 (×2): qty 2

## 2020-02-22 MED ORDER — FOLIC ACID 1 MG PO TABS
1.0000 mg | ORAL_TABLET | Freq: Every day | ORAL | Status: DC
  Administered 2020-02-23 – 2020-02-24 (×2): 1 mg via ORAL
  Filled 2020-02-22 (×2): qty 1

## 2020-02-22 MED ORDER — ATORVASTATIN CALCIUM 20 MG PO TABS
20.0000 mg | ORAL_TABLET | Freq: Every day | ORAL | Status: DC
  Administered 2020-02-23 (×2): 20 mg via ORAL
  Filled 2020-02-22 (×2): qty 1

## 2020-02-22 NOTE — H&P (Addendum)
History and Physical    Richard Carter UEA:[id number] DOB: 24-Nov-1941 DOA: 02/22/2020  PCP: Marisue Ivan, MD  Patient coming from: Home  I have personally briefly reviewed patient's old medical records in [facility]  Chief Complaint: New onset seizure  HPI: Richard Carter is a 79 y.o. male with medical history significant of history of GERD, alcohol abuse who presents to the emergency department for a witnessed seizure x2 outside of hospital.  On my arrival the patient is resting in bed in no visible distress however is unable to provide history.  The patient's neighbor sitting next home and provides majority of the history.  Per the neighbor the patient had witnessed tonic-clonic seizure at home which time the neighbor called EMS.  The neighbor noted the patient to have vomited on himself.  No loss of bowel or bladder control or tongue biting noted.  When EMS was called the patient had another witnessed tonic-clonic seizure.  EMS administered 2 mg of IV Versed after which the patient became briefly apneic and required bag-valve-mask ventilation.  Upon arrival to the emergency department the patient is more awake and alert.  Briefly required nonrebreather but quickly able to wean the nasal cannula.  Patient was alert oriented to person and place.  No history of previous seizures.  No history of alcohol withdrawal or alcohol withdrawal seizures.  Patient drinks daily approximately 1/5 of bourbon.  Patient's only complaint at time of my evaluation is some indigestion.  ED Course: Initial laboratory work-up significant for mild AKI and hypokalemia of 2.5.  This was replaced in the ED.  Urine drug screen and urinalysis were ordered and are pending at this time.  Serum EtOH 31.  No seizure abortive medications were administered.  CT head negative for acute infarct or acute abnormality.  Patient was started on CIWA protocol and given intravenous fluids.  Hospitalist called for  admission.  Review of Systems: As per HPI otherwise 10 point review of systems negative.    Past Medical History:  Diagnosis Date  . Alcohol abuse   . Dementia (HCC)   . GERD (gastroesophageal reflux disease)   . HLD (hyperlipidemia)   . HTN (hypertension)     Past Surgical History:  Procedure Laterality Date  . skin cyst removal       reports that he has been smoking. He has never used smokeless tobacco. He reports that he does not drink alcohol. No history on file for drug.  No Known Allergies  Family History  Problem Relation Age of Onset  . Stroke Mother   . Cancer Brother     Prior to Admission medications   Medication Sig Start Date End Date Taking? Authorizing Provider  atorvastatin (LIPITOR) 20 MG tablet Take 20 mg by mouth at bedtime. 07/09/19  Yes [provider]  donepezil (ARICEPT) 10 MG tablet Take 10 mg by mouth at bedtime. 10/29/19  Yes [provider]  escitalopram (LEXAPRO) 10 MG tablet Take 10 mg by mouth daily. 07/13/19  Yes [provider]  magnesium oxide (MAG-OX) 400 MG tablet Take 1 tablet by mouth daily. 10/14/19  Yes [provider]  omeprazole (PRILOSEC) 40 MG capsule Take 40 mg by mouth 2 (two) times daily. 05/03/19  Yes [provider]  traZODone (DESYREL) 100 MG tablet Take 200 mg by mouth at bedtime.  05/03/19  Yes [provider]    Physical Exam: Vitals:   02/22/20 1330 02/22/20 1340 02/22/20 1422 02/22/20 1450  BP: 94/61  108/66 100/71 124/89  Pulse: 94 97 (!) 104 (!) 104  Resp: (!) 21 18 20 19   Temp:      TempSrc:      SpO2: 95% 100% 94% 95%     Vitals:   02/22/20 1330 02/22/20 1340 02/22/20 1422 02/22/20 1450  BP: 94/61 108/66 100/71 124/89  Pulse: 94 97 (!) 104 (!) 104  Resp: (!) 21 18 20 19   Temp:      TempSrc:      SpO2: 95% 100% 94% 95%  Constitutional: NAD, calm, comfortable, appears disheveled Eyes: PERRL, lids and conjunctivae normal ENMT: Mucous membranes are dry.  Posterior pharynx clear of any exudate or lesions.poor dentition Neck: normal, supple, no masses, no thyromegaly Respiratory: Bibasilar crackles, no wheeze, normal respiratory effort Cardiovascular: Tachycardic, regular rhythm, no murmurs Abdomen: Nontender, nondistended, positive bowel sounds Musculoskeletal: no clubbing / cyanosis. No joint deformity upper and lower extremities. Good ROM, no contractures. Normal muscle tone.  Skin: Scattered excoriations Neurologic: CN 2-12 grossly intact. Sensation intact, DTR normal. Strength 5/5 in all 4.  Psychiatric: Normal judgment and insight. Alert and oriented x 3. Normal mood.    Labs on Admission: I have personally reviewed following labs and imaging studies  CBC: Recent Labs  Lab 02/22/20 1303  WBC 8.2  NEUTROABS 5.1  HGB 12.5*  HCT 36.4*  MCV 96.0  PLT 222   Basic Metabolic Panel: Recent Labs  Lab 02/22/20 1303  NA 142  K 2.5*  CL 104  CO2 22  GLUCOSE 93  BUN 13  CREATININE 1.30*  CALCIUM 8.3*  MG 1.5*   GFR: CrCl cannot be calculated (Unknown ideal weight.). Liver Function Tests: Recent Labs  Lab 02/22/20 1303  AST 21  ALT 11  ALKPHOS 55  BILITOT 0.7  PROT 5.7*  ALBUMIN 3.0*   No results for input(s): LIPASE, AMYLASE in the last 168 hours. No results for input(s): AMMONIA in the last 168 hours. Coagulation Profile: No results for input(s): INR, PROTIME in the last 168 hours. Cardiac Enzymes: No results for input(s): CKTOTAL, CKMB, CKMBINDEX, TROPONINI in the last 168 hours. BNP (last 3 results) No results for input(s): PROBNP in the last 8760 hours. HbA1C: No results for input(s): HGBA1C in the last 72 hours. CBG: No results for input(s): GLUCAP in the last 168 hours. Lipid Profile: No results for input(s): CHOL, HDL, LDLCALC, TRIG, CHOLHDL, LDLDIRECT in the last 72 hours. Thyroid Function Tests: No results for input(s): TSH, T4TOTAL, FREET4, T3FREE, THYROIDAB in the last 72 hours. Anemia Panel: No  results for input(s): VITAMINB12, FOLATE, FERRITIN, TIBC, IRON, RETICCTPCT in the last 72 hours. Urine analysis:    Component Value Date/Time   COLORURINE YELLOW (A) 07/29/2019 2121   APPEARANCEUR HAZY (A) 07/29/2019 2121   APPEARANCEUR Clear 06/12/2014 1029   LABSPEC 1.015 07/29/2019 2121   LABSPEC 1.005 06/12/2014 1029   PHURINE 5.0 07/29/2019 2121   GLUCOSEU NEGATIVE 07/29/2019 2121   GLUCOSEU Negative 06/12/2014 1029   HGBUR NEGATIVE 07/29/2019 2121   BILIRUBINUR NEGATIVE 07/29/2019 2121   BILIRUBINUR Negative 06/12/2014 1029   KETONESUR 5 (A) 07/29/2019 2121   PROTEINUR NEGATIVE 07/29/2019 2121   NITRITE POSITIVE (A) 07/29/2019 2121   LEUKOCYTESUR LARGE (A) 07/29/2019 2121   LEUKOCYTESUR Negative 06/12/2014 1029    Radiological Exams on Admission: CT HEAD WO CONTRAST  Result Date: 02/22/2020 CLINICAL DATA:  New onset seizure today. History of alcohol abuse. EXAM: CT HEAD WITHOUT CONTRAST TECHNIQUE: Contiguous axial images were obtained from the base of  the skull through the vertex without intravenous contrast. COMPARISON:  Head CT 12/02/2019 FINDINGS: Brain: Generalized atrophy is cerebral volume loss, stable from prior exam. No intracranial hemorrhage, mass effect, or midline shift. No hydrocephalus. The basilar cisterns are patent. Moderate periventricular white matter hypodensity consistent with chronic small vessel ischemia, unchanged. No acute pontine abnormalities. No evidence of territorial infarct or acute ischemia. No extra-axial or intracranial fluid collection. Vascular: Atherosclerosis of skullbase vasculature without hyperdense vessel or abnormal calcification. Skull: No fracture or focal lesion. Sinuses/Orbits: Paranasal sinuses and mastoid air cells are clear. The visualized orbits are unremarkable. Other: Soft tissue nodule in the right occipital scalp is unchanged from prior, may be a sebaceous cyst or sequela of remote injury. IMPRESSION: No acute intracranial  abnormality. Stable atrophy and chronic small vessel ischemia. Electronically Signed   By: Keith Rake M.D.   On: 02/22/2020 14:15    EKG: Independently reviewed.  Read as atrial flutter with 3 1 block.  Suspect this is artifact secondary to poor baseline.  Appears like sinus arrhythmia.  Assessment/Plan Active Problems:   Seizure ([place])  Suspected alcohol withdrawal seizure, new onset Suspect alcohol withdrawal as cause of new onset seizure 79 year old gentleman Patient is a long history of alcoholism Plan: Admit inpatient CIWA protocol Supplemental IV fluids Multivitamins, folic acid, thiamine IV Ativan as needed for seizure activity Seizure precautions Neurology consult, messaged Dr. Doy Mince, recommendations appreciated  Alcohol abuse Alcohol withdrawal CIWA protocol Social work consult  Hyperlipidemia Continue home Lipitor  Dementia Depression Continue home Lexapro Continue home Aricept Nightly trazodone  GERD Continue home PPI As needed Maalox  DVT prophylaxis: Lovenox Code Status: DNR. Patient's family brought in form confirming code status Family Communication: Neighbor at bedside.  Per the neighbor patient is not in close contact with his family Disposition Plan: He will return to previous home environment Consults called: Neurology-[facility] Admission status: Inpatient   Sidney Ace MD [facility] Pager [phone]   If 7PM-7AM, please contact night-coverage   02/22/2020, 3:42 PM

## 2020-02-22 NOTE — ED Notes (Signed)
Pt alert, watching tv  Iv fluids infusing.

## 2020-02-22 NOTE — ED Provider Notes (Addendum)
[facility] Emergency Department Provider Note  ____________________________________________   First MD Initiated Contact with Patient 02/22/20 1310     (approximate)  I have reviewed the triage vital signs and the nursing notes.  History  Chief Complaint Seizures    HPI Richard Carter is a 79 y.o. male with history of GERD, alcohol abuse who presents to the emergency department via EMS for seizure x 2 today.  Second seizure was witnessed by EMS, they describe upper extremity stiffening and straightening and decreased responsiveness.  They administered 2.5 mg Versed, after which the patient became briefly apneic and required bagging.  On arrival to the emergency department he is more awake and alert, arrives on NRB but able to wean quickly to [place].  Oriented to person, age, date of birth. Does not know location or year. Denies any history of prior seizures, no history of alcohol withdrawal seizures.  States he had a small amount of bourbon today.  Drinks on a near daily basis.  No acute complaints on arrival, denies any headache, chest pain, abdominal pain.  Was otherwise feeling at his baseline earlier today.   Past Medical Hx Past Medical History:  Diagnosis Date  . Alcohol abuse   . Dementia (HCC)   . GERD (gastroesophageal reflux disease)   . HLD (hyperlipidemia)   . HTN (hypertension)     Problem List Patient Active Problem List   Diagnosis Date Noted  . Pubic ramus fracture (HCC) 07/29/2019  . Acetabular fracture (HCC) 07/29/2019  . HLD (hyperlipidemia) 07/29/2019  . GERD (gastroesophageal reflux disease) 07/29/2019  . UTI (urinary tract infection) 07/29/2019    Past Surgical Hx Past Surgical History:  Procedure Laterality Date  . skin cyst removal      Medications Prior to Admission medications   Medication Sig Start Date End Date Taking? Authorizing Provider  atorvastatin (LIPITOR) 20 MG tablet Take 20 mg by mouth at bedtime. 07/09/19    [provider]  escitalopram (LEXAPRO) 10 MG tablet Take 10 mg by mouth daily. 07/13/19   [provider]  omeprazole (PRILOSEC) 40 MG capsule Take 40 mg by mouth 2 (two) times daily. 05/03/19   [provider]  traZODone (DESYREL) 100 MG tablet Take 200 mg by mouth at bedtime as needed for sleep.  05/03/19   [provider]    Allergies Patient has no known allergies.  Family Hx Family History  Problem Relation Age of Onset  . Stroke Mother   . Cancer Brother     Social Hx Social History   Tobacco Use  . Smoking status: Current Every Day Smoker  . Smokeless tobacco: Never Used  Substance Use Topics  . Alcohol use: No  . Drug use: Not on file     Review of Systems  Constitutional: Negative for fever. Negative for chills. Eyes: Negative for visual changes. ENT: Negative for sore throat. Cardiovascular: Negative for chest pain. Respiratory: Negative for shortness of breath. Gastrointestinal: Negative for nausea. Negative for vomiting.  Genitourinary: Negative for dysuria. Musculoskeletal: Negative for leg swelling. Skin: Negative for rash. Neurological: Negative for headaches. + seizure   Physical Exam  Vital Signs: ED Triage Vitals  Enc Vitals Group     BP 02/22/20 1306 (!) 73/46     Pulse Rate 02/22/20 1306 89     Resp 02/22/20 1306 17     Temp 02/22/20 1306 97.8 F (36.6 C)     Temp Source 02/22/20 1306 Oral  SpO2 02/22/20 1306 98 %     Weight --      Height --      Head Circumference --      Peak Flow --      Pain Score 02/22/20 1305 0     Pain Loc --      Pain Edu? --      Excl. in [place]? --     Constitutional: Awake and alert. Oriented to self, birthday, age. States year is 2020. Does not know location.  Somewhat disheveled, appears older than stated age. Head: Normocephalic. Atraumatic. Eyes: Conjunctivae clear. Sclera anicteric. Pupils equal and symmetric. Nose: No masses or lesions. No congestion or  rhinorrhea. Mouth/Throat: Wearing mask.  Neck: No stridor. Trachea midline.  Cardiovascular: Normal rate, regular rhythm. Extremities well perfused. Arrives hypotensive 70s/40s.  Respiratory: Normal respiratory effort.  Lungs CTAB. Gastrointestinal: Soft. Non-distended. Non-tender.  Genitourinary: Deferred. Musculoskeletal: No lower extremity edema. No deformities. Neurologic:  Normal speech and language. No gross focal or lateralizing neurologic deficits are appreciated. Follows commands. GCS 14 for mild confusion.  Skin: Skin is warm, dry and intact. No rash noted. Psychiatric: Mood and affect are appropriate for situation.  EKG  Personally reviewed and interpreted by myself.   Rate: 98 Rhythm: irregular Axis: normal Intervals: prolonged QTc 511 ms No STEMI  14:52 Rate: 99 Rhythm: appears to have P waves vs U wave 2/2 hypokalemia, appears more regular than prior Axis: normal Intervals: within normal limits, QTc 443 ms No acute ischemic changes No STEMI    Radiology   CT head  IMPRESSION:  No acute intracranial abnormality. Stable atrophy and chronic small  vessel ischemia.    Procedures  Procedure(s) performed (including critical care):  Procedures   Initial Impression / Assessment and Plan / MDM / ED Course  79 y.o. male who presents to the ED for seizure x 2 today. No hx of same. Hx of alcohol abuse.   Ddx: alcohol withdrawal seizure, electrolyte abnormality, intracranial abnormality, arrhythmia  Will plan for labs, imaging, EKG. CIWA protocol in place.  Slightly hypotensive on arrival, suspect secondary to the Versed administered by EMS and likely decreased intake related to alcohol use, will bolus fluids and continue to monitor.  Clinical Course as of Feb 22 2104  Tue Feb 22, 2020  1407 Labs thus far reveal AKI, creatinine 1.3 from normal previously, hypokalemia 2.5.  Receiving fluids, will replete K.  Will provide thiamine and folate.   [SM]  1440  Reevaluated patient, blood pressure improving, now alert and oriented x3.  Neighbor is at bedside who witnessed the first seizure.  He describes the patient's eyes rolling back in his head, stiffening of his arm, decreased responsiveness.  States this lasted several minutes before spontaneously resolving.  Given new onset seizure x2, AKI, multiple electrolyte abnormalities, will plan for admission.  Highest suspicion is for alcohol withdrawal seizures at this time.  Patient agreeable with admission.   [SM]  2104 UA concerning for infection with LE, WBC, bacteria, WBC clumps, will treat.   [SM]    Clinical Course User Index [SM] Lilia Pro., MD     _______________________________   As part of my medical decision making I have reviewed available labs, radiology tests, reviewed old records, obtained additional history from neighbor at bedside.   Final Clinical Impression(s) / ED Diagnosis  Final diagnoses:  Seizure [facility])       Note:  This document was prepared using Dragon voice recognition software and may include  unintentional dictation errors.     Miguel Aschoff., MD 02/22/20 2108

## 2020-02-22 NOTE — ED Notes (Signed)
Pt assisted to toilet with one person assist. Pt complains of diarrhea at this time.

## 2020-02-22 NOTE — ED Triage Notes (Signed)
BIBA from home for seizures. One witnessed by family, one witnessed by EMS. No prior history of seizures reported. Received NS and 2mg  versed. Patient required BVM after versed. Arrives on NRB with spontaneous respirations. H/o alcoholism; has had some alcohol today, unsure how much.

## 2020-02-22 NOTE — ED Notes (Signed)
Report called to donna rn floor nurse 

## 2020-02-23 ENCOUNTER — Inpatient Hospital Stay: Payer: Medicare HMO

## 2020-02-23 DIAGNOSIS — R569 Unspecified convulsions: Secondary | ICD-10-CM

## 2020-02-23 LAB — CBC WITH DIFFERENTIAL/PLATELET
Abs Immature Granulocytes: 0.05 10*3/uL (ref 0.00–0.07)
Basophils Absolute: 0 10*3/uL (ref 0.0–0.1)
Basophils Relative: 0 %
Eosinophils Absolute: 0.4 10*3/uL (ref 0.0–0.5)
Eosinophils Relative: 4 %
HCT: 30.6 % — ABNORMAL LOW (ref 39.0–52.0)
Hemoglobin: 10.2 g/dL — ABNORMAL LOW (ref 13.0–17.0)
Immature Granulocytes: 1 %
Lymphocytes Relative: 20 %
Lymphs Abs: 1.9 10*3/uL (ref 0.7–4.0)
MCH: 32.9 pg (ref 26.0–34.0)
MCHC: 33.3 g/dL (ref 30.0–36.0)
MCV: 98.7 fL (ref 80.0–100.0)
Monocytes Absolute: 0.7 10*3/uL (ref 0.1–1.0)
Monocytes Relative: 8 %
Neutro Abs: 6.6 10*3/uL (ref 1.7–7.7)
Neutrophils Relative %: 67 %
Platelets: 197 10*3/uL (ref 150–400)
RBC: 3.1 MIL/uL — ABNORMAL LOW (ref 4.22–5.81)
RDW: 18.4 % — ABNORMAL HIGH (ref 11.5–15.5)
WBC: 9.7 10*3/uL (ref 4.0–10.5)
nRBC: 0 % (ref 0.0–0.2)

## 2020-02-23 LAB — BASIC METABOLIC PANEL
Anion gap: 7 (ref 5–15)
BUN: 16 mg/dL (ref 8–23)
CO2: 25 mmol/L (ref 22–32)
Calcium: 8.6 mg/dL — ABNORMAL LOW (ref 8.9–10.3)
Chloride: 110 mmol/L (ref 98–111)
Creatinine, Ser: 0.99 mg/dL (ref 0.61–1.24)
GFR calc Af Amer: >60 mL/min (ref >60)
GFR calc non Af Amer: >60 mL/min (ref >60)
Glucose, Bld: 93 mg/dL (ref 70–99)
Potassium: 3.3 mmol/L — ABNORMAL LOW (ref 3.5–5.1)
Sodium: 142 mmol/L (ref 135–145)

## 2020-02-23 LAB — SARS CORONAVIRUS 2 (TAT 6-24 HRS): SARS Coronavirus 2: NEGATIVE

## 2020-02-23 MED ORDER — LEVETIRACETAM 500 MG PO TABS
500.0000 mg | ORAL_TABLET | Freq: Two times a day (BID) | ORAL | Status: DC
  Administered 2020-02-23 – 2020-02-24 (×3): 500 mg via ORAL
  Filled 2020-02-23 (×4): qty 1

## 2020-02-23 MED ORDER — GADOBUTROL 1 MMOL/ML IV SOLN
7.0000 mL | Freq: Once | INTRAVENOUS | Status: AC | PRN
  Administered 2020-02-23: 7 mL via INTRAVENOUS

## 2020-02-23 MED ORDER — SODIUM CHLORIDE 0.9 % IV SOLN
1.0000 g | INTRAVENOUS | Status: DC
  Administered 2020-02-23: 19:00:00 1 g via INTRAVENOUS
  Filled 2020-02-23: qty 10
  Filled 2020-02-23: qty 1

## 2020-02-23 MED ORDER — POTASSIUM CHLORIDE CRYS ER 20 MEQ PO TBCR
40.0000 meq | EXTENDED_RELEASE_TABLET | Freq: Once | ORAL | Status: AC
  Administered 2020-02-23: 40 meq via ORAL
  Filled 2020-02-23: qty 2

## 2020-02-23 NOTE — Procedures (Signed)
ELECTROENCEPHALOGRAM REPORT   Patient: Richard Carter       Room #: 104A-AA EEG No. ID: [id number] Age: 79 y.o.        Sex: male Requesting Physician: Jarvis Newcomer Report Date:  02/23/2020        Interpreting Physician: Thana Farr  History: [name] is an 79 y.o. male with new onset seizure  Medications:  Lexapro, Lipitor, Rocephin, Aricept, Keppra, Trazodone, Thiamine  Conditions of Recording:  This is a 21 channel routine scalp EEG performed with bipolar and monopolar montages arranged in accordance to the international 10/20 system of electrode placement. One channel was dedicated to EKG recording.  The patient is in the awake, drowsy and asleep states.  Description:  The patient is awake briefly.  During wakefulness background activity consists of a low voltage, symmetrical, fairly well organized, 8 Hz alpha activity, seen from the parieto-occipital and posterior temporal regions.  Low voltage fast activity, poorly organized, is seen anteriorly and is at times superimposed on more posterior regions.  A mixture of theta and alpha rhythms are seen from the central and temporal regions. The patient drowses with slowing to irregular, low voltage theta and beta activity.   The patient goes in to a light sleep with symmetrical sleep spindles, vertex central sharp transients and irregular slow activity.   No epileptiform activity is noted.   Hyperventilation was not performed.  Intermittent photic stimulation was performed but failed to illicit any change in the tracing.  IMPRESSION: Normal electroencephalogram, awake, asleep and with activation procedures. There are no focal lateralizing or epileptiform features.   Thana Farr, MD Neurology [phone] 02/23/2020, 6:32 PM

## 2020-02-23 NOTE — Consult Note (Signed)
Reason for Consult:Seizure Requesting Physician: Jarvis Newcomer  CC: Seizure  I have been asked by Dr. Jarvis Newcomer to see this patient in consultation for Seizure.  HPI: Richard Carter is an 79 y.o. male with medical history significant for dementia, GERD, alcohol abuse who presents to the emergency department for a witnessed seizure outside of hospital.  Per caregiver patient had his eyes roll back, then had clenching of the right fist with unresponsiveness.  Resolved spontaneously and was short-lived.  Patient then had another episode and EMS was called.  No loss of bowel or bladder control or tongue biting noted. EMS administered 2 mg of IV Versed after which the patient became briefly apneic and required bag-valve-mask ventilation.  Upon arrival to the emergency department the patient is more awake and alert.   Past Medical History:  Diagnosis Date  . Alcohol abuse   . Dementia (HCC)   . GERD (gastroesophageal reflux disease)   . HLD (hyperlipidemia)   . HTN (hypertension)     Past Surgical History:  Procedure Laterality Date  . skin cyst removal      Family History  Problem Relation Age of Onset  . Stroke Mother   . Cancer Brother     Social History:  reports that he has been smoking. He has never used smokeless tobacco. He reports that he does not drink alcohol. No history on file for drug.  No Known Allergies  Medications:  I have reviewed the patient's current medications. Prior to Admission:  Medications Prior to Admission  Medication Sig Dispense Refill Last Dose  . atorvastatin (LIPITOR) 20 MG tablet Take 20 mg by mouth at bedtime.   02/21/2020 at 2200  . donepezil (ARICEPT) 10 MG tablet Take 10 mg by mouth at bedtime.   02/21/2020 at 2200  . escitalopram (LEXAPRO) 10 MG tablet Take 10 mg by mouth daily.   02/22/2020 at 1000  . magnesium oxide (MAG-OX) 400 MG tablet Take 1 tablet by mouth daily.   02/22/2020 at 1000  . omeprazole (PRILOSEC) 40 MG capsule Take 40 mg by mouth 2  (two) times daily.   02/22/2020 at 1000  . traZODone (DESYREL) 100 MG tablet Take 200 mg by mouth at bedtime.    02/21/2020 at 2200   Scheduled: . atorvastatin  20 mg Oral QHS  . donepezil  10 mg Oral QHS  . enoxaparin (LOVENOX) injection  40 mg Subcutaneous Q24H  . escitalopram  10 mg Oral Daily  . folic acid  1 mg Oral Daily  . LORazepam  0-4 mg Intravenous Q6H   Or  . LORazepam  0-4 mg Oral Q6H  . [START ON 02/24/2020] LORazepam  0-4 mg Intravenous Q12H   Or  . [START ON 02/24/2020] LORazepam  0-4 mg Oral Q12H  . multivitamin with minerals  1 tablet Oral Daily  . pantoprazole  40 mg Oral Daily  . potassium chloride  40 mEq Oral Once  . thiamine  100 mg Oral Daily  . traZODone  200 mg Oral QHS    ROS: History obtained from the patient and caregiver  General ROS: negative for - chills, fatigue, fever, night sweats, weight gain or weight loss Psychological ROS: memory difficulties, mood swings Ophthalmic ROS: negative for - blurry vision, double vision, eye pain or loss of vision ENT ROS: negative for - epistaxis, nasal discharge, oral lesions, sore throat, tinnitus or vertigo Allergy and Immunology ROS: negative for - hives or itchy/watery eyes Hematological and Lymphatic ROS: negative for - bleeding  problems, bruising or swollen lymph nodes Endocrine ROS: negative for - galactorrhea, hair pattern changes, polydipsia/polyuria or temperature intolerance Respiratory ROS: negative for - cough, hemoptysis, shortness of breath or wheezing Cardiovascular ROS: negative for - chest pain, dyspnea on exertion, edema or irregular heartbeat Gastrointestinal ROS: negative for - abdominal pain, diarrhea, hematemesis, nausea/vomiting or stool incontinence Genito-Urinary ROS: negative for - dysuria, hematuria, incontinence or urinary frequency/urgency Musculoskeletal ROS: negative for - joint swelling or muscular weakness Neurological ROS: as noted in HPI Dermatological ROS: negative for rash and  skin lesion changes  Physical Examination: Blood pressure 134/69, pulse 89, temperature 99.4 F (37.4 C), temperature source Axillary, resp. rate 19, height 5' 4.5" (1.638 m), weight 63.5 kg, SpO2 92 %.  HEENT-  Normocephalic, no lesions, without obvious abnormality.  Normal external eye and conjunctiva.  Normal TM's bilaterally.  Normal auditory canals and external ears. Normal external nose, mucus membranes and septum.  Normal pharynx. Cardiovascular- S1, S2 normal, pulses palpable throughout   Lungs- chest clear, no wheezing, rales, normal symmetric air entry Abdomen- soft, non-tender; bowel sounds normal; no masses,  no organomegaly Extremities- no edema Lymph-no adenopathy palpable Musculoskeletal-no joint tenderness, deformity or swelling Skin-warm and dry, no hyperpigmentation, vitiligo, or suspicious lesions  Neurological Examination   Mental Status: Alert, oriented to person, place, time and President.  Able to do simple calculations but memory is poor.  Speech fluent without evidence of aphasia.  Able to follow simple commands without difficulty but has difficulty with 3-step commands.   Cranial Nerves: II: Visual fields grossly normal, pupils equal, round, reactive to light and accommodation III,IV, VI: ptosis not present, extra-ocular motions intact bilaterally V,VII: decreased left NLF, facial light touch sensation normal bilaterally VIII: hearing normal bilaterally IX,X: gag reflex present XI: bilateral shoulder shrug XII: midline tongue extension Motor: Right : Upper extremity   5/5    Left:     Upper extremity   5/5  Lower extremity   5/5     Lower extremity   5/5 Tone and bulk:normal tone throughout; no atrophy noted Sensory: Pinprick and light touch intact throughout, bilaterally Deep Tendon Reflexes: Symmetric throughout Plantars: Right: mute   Left: mute Cerebellar: Dysmetria with finger to nose on the left.  No dysmetria noted with the RUE.  Unable to perform  heel-to-shin due to ability to follow commands.   Gait: not tested due to safety concerns    Laboratory Studies:   Basic Metabolic Panel: Recent Labs  Lab 02/22/20 1303 02/23/20 0422  NA 142 142  K 2.5* 3.3*  CL 104 110  CO2 22 25  GLUCOSE 93 93  BUN 13 16  CREATININE 1.30* 0.99  CALCIUM 8.3* 8.6*  MG 1.5*  --     Liver Function Tests: Recent Labs  Lab 02/22/20 1303  AST 21  ALT 11  ALKPHOS 55  BILITOT 0.7  PROT 5.7*  ALBUMIN 3.0*   No results for input(s): LIPASE, AMYLASE in the last 168 hours. No results for input(s): AMMONIA in the last 168 hours.  CBC: Recent Labs  Lab 02/22/20 1303 02/23/20 0422  WBC 8.2 9.7  NEUTROABS 5.1 6.6  HGB 12.5* 10.2*  HCT 36.4* 30.6*  MCV 96.0 98.7  PLT 222 197    Cardiac Enzymes: No results for input(s): CKTOTAL, CKMB, CKMBINDEX, TROPONINI in the last 168 hours.  BNP: Invalid input(s): POCBNP  CBG: No results for input(s): GLUCAP in the last 168 hours.  Microbiology: Results for orders placed or performed during the hospital  encounter of 02/22/20  SARS CORONAVIRUS 2 (TAT 6-24 HRS) Nasopharyngeal Nasopharyngeal Swab     Status: None   Collection Time: 02/22/20  2:53 PM   Specimen: Nasopharyngeal Swab  Result Value Ref Range Status   SARS Coronavirus 2 NEGATIVE NEGATIVE Final    Comment: (NOTE) SARS-CoV-2 target nucleic acids are NOT DETECTED. The SARS-CoV-2 RNA is generally detectable in upper and lower respiratory specimens during the acute phase of infection. Negative results do not preclude SARS-CoV-2 infection, do not rule out co-infections with other pathogens, and should not be used as the sole basis for treatment or other patient management decisions. Negative results must be combined with clinical observations, patient history, and epidemiological information. The expected result is Negative. Fact Sheet for Patients: HairSlick.no Fact Sheet for Healthcare  Providers: quierodirigir.com This test is not yet approved or cleared by the [place] FDA and  has been authorized for detection and/or diagnosis of SARS-CoV-2 by FDA under an Emergency Use Authorization (EUA). This EUA will remain  in effect (meaning this test can be used) for the duration of the COVID-19 declaration under Section 56 4(b)(1) of the Act, 21 U.S.C. section 360bbb-3(b)(1), unless the authorization is terminated or revoked sooner. Performed at [facility], [address] [place], [place] [address]     Coagulation Studies: No results for input(s): LABPROT, INR in the last 72 hours.  Urinalysis:  Recent Labs  Lab 02/22/20 1751  COLORURINE AMBER*  LABSPEC 1.018  PHURINE 5.0  GLUCOSEU NEGATIVE  HGBUR NEGATIVE  BILIRUBINUR NEGATIVE  KETONESUR NEGATIVE  PROTEINUR 100*  NITRITE NEGATIVE  LEUKOCYTESUR LARGE*    Lipid Panel:  No results found for: CHOL, TRIG, HDL, CHOLHDL, VLDL, LDLCALC  HgbA1C: No results found for: HGBA1C  Urine Drug Screen:      Component Value Date/Time   LABOPIA NONE DETECTED 02/22/2020 1751   COCAINSCRNUR NONE DETECTED 02/22/2020 1751   LABBENZ POSITIVE (A) 02/22/2020 1751   AMPHETMU NONE DETECTED 02/22/2020 1751   THCU NONE DETECTED 02/22/2020 1751   LABBARB NONE DETECTED 02/22/2020 1751    Alcohol Level:  Recent Labs  Lab 02/22/20 1303  ETH 31*     Imaging: CT HEAD WO CONTRAST  Result Date: 02/22/2020 CLINICAL DATA:  New onset seizure today. History of alcohol abuse. EXAM: CT HEAD WITHOUT CONTRAST TECHNIQUE: Contiguous axial images were obtained from the base of the skull through the vertex without intravenous contrast. COMPARISON:  Head CT 12/02/2019 FINDINGS: Brain: Generalized atrophy is cerebral volume loss, stable from prior exam. No intracranial hemorrhage, mass effect, or midline shift. No hydrocephalus. The basilar cisterns are patent. Moderate periventricular white matter hypodensity  consistent with chronic small vessel ischemia, unchanged. No acute pontine abnormalities. No evidence of territorial infarct or acute ischemia. No extra-axial or intracranial fluid collection. Vascular: Atherosclerosis of skullbase vasculature without hyperdense vessel or abnormal calcification. Skull: No fracture or focal lesion. Sinuses/Orbits: Paranasal sinuses and mastoid air cells are clear. The visualized orbits are unremarkable. Other: Soft tissue nodule in the right occipital scalp is unchanged from prior, may be a sebaceous cyst or sequela of remote injury. IMPRESSION: No acute intracranial abnormality. Stable atrophy and chronic small vessel ischemia. Electronically Signed   By: Narda Rutherford M.D.   On: 02/22/2020 14:15     Assessment/Plan: 79 year old male with medical history significant of history of GERD, alcohol abuse who presented to the emergency department for seizure.  Patient with no prior history of seizure.  ETOH 31 on presentation which  goes against ETOH withdrawal seizure.  Patient also with a history of dementia and concern for described focality of seizures.  Head CT personally reviewed and shows no acute changes.  Further work up recommended.    Recommendations: 1. EEG 2. MRI of the brain with and without contrast 3. Seizure precautions 4. Keppra 500mg  po BID 5. Patient unable to drive, operate heavy machinery, perform activities at heights and participate in water activities until release by outpatient physician.    , MD Neurology [phone] 02/23/2020, 11:33 AM

## 2020-02-23 NOTE — Progress Notes (Signed)
eeg completed ° °

## 2020-02-23 NOTE — Progress Notes (Signed)
PROGRESS NOTE  Richard Carter  PPI:[id number] DOB: 1941-04-29 DOA: 02/22/2020 PCP: Dion Body, MD   Brief Narrative: Richard Carter is a 79 y.o. male with a history of dementia, alcohol abuse (~Fifth bourbon per day), HTN, HLD, GERD who presented to the ED 3/30 due to seizures. A first episode was witnessed by his roommate, and a second by EMS who gave versed after which the patient became briefly apneic requiring bag-valve-mask ventilation, subsequently weaned back to room air. He denies recently trying to stop drinking. EtOH level was 31 in ED, UDS positive only for benzodiazepines (having been given versed). CT head without changes. Neurology has been consulted with plans for EEG, MRI, starting keppra.   Assessment & Plan: Active Problems:   Seizure ([place])  Seizures: Concern for focality based on report.  - Start keppra 500mg  po BID per neurology - MRI brain, EEG per neurology.  - Seizure precautions while admitted   Alcohol abuse: Significant intake, though has +EtOH on arrival, not consistent with withdrawal. CIWA zero thus far, though would be expected to peak 48-72hrs.  - Continue CIWA protocol in this patient at high risk of recurrent seizure based on intake.  - MVM, thiamine  Hypokalemia: Supplemented - Supplement again and monitor regularly  AKI: Improved - Continue IVF, can stop once taking po regularly.  Pyuria:  - Continue ceftriaxone pending culture data  Dementia, depression:  - Continue home medications including aricept, trazodone, lexapro  GERD:  - Continue PPI, prn maalox  HLD:  - Continue statin. LFTs wnl.   DVT prophylaxis: Lovenox Code Status: DNR Family Communication: None at bedside Disposition Plan: Patient from home, likely to return once seizure work up completed and alcohol withdrawal symptoms are stable.   Consultants:   Neurology  Procedures:   EEG 02/23/2020  Antimicrobials:  Ceftriaxone 3/31 - 4/2  Subjective: No further  seizures, has no desire to stop drinking. Denies HA, neck stiffness/pain, visual changes, N/V/D. Wants to eat.  Objective: Vitals:   02/23/20 0000 02/23/20 0010 02/23/20 0422 02/23/20 0800  BP:  (!) 158/99 (!) 132/48 134/69  Pulse: 94 (!) 111 80 89  Resp: 19 16 17 19   Temp:    99.4 F (37.4 C)  TempSrc:    Axillary  SpO2: (!) 89% 97% 94% 92%  Weight:      Height:        Intake/Output Summary (Last 24 hours) at 02/23/2020 1549 Last data filed at 02/23/2020 0500 Gross per 24 hour  Intake 697.28 ml  Output --  Net 697.28 ml   Filed Weights   02/22/20 1617  Weight: 63.5 kg    Gen: 79 y.o. male in no distress  Pulm: Non-labored breathing. Clear to auscultation bilaterally.  CV: Regular rate and rhythm. No murmur, rub, or gallop. No JVD, no pedal edema. GI: Abdomen soft, non-tender, non-distended, with normoactive bowel sounds. No organomegaly or masses felt. Ext: Warm, no deformities Skin: No rashes, lesions or ulcers Neuro: Alert and incompletely oriented. No focal neurological deficits. Psych: Judgement and insight appear slightly impaired. Mood & affect appropriate.   Data Reviewed: I have personally reviewed following labs and imaging studies  CBC: Recent Labs  Lab 02/22/20 1303 02/23/20 0422  WBC 8.2 9.7  NEUTROABS 5.1 6.6  HGB 12.5* 10.2*  HCT 36.4* 30.6*  MCV 96.0 98.7  PLT 222 063   Basic Metabolic Panel: Recent Labs  Lab 02/22/20 1303 02/23/20 0422  NA 142 142  K 2.5* 3.3*  CL 104 110  CO2 22 25  GLUCOSE 93 93  BUN 13 16  CREATININE 1.30* 0.99  CALCIUM 8.3* 8.6*  MG 1.5*  --    GFR: Estimated Creatinine Clearance: 50.8 mL/min (by C-G formula based on SCr of 0.99 mg/dL). Liver Function Tests: Recent Labs  Lab 02/22/20 1303  AST 21  ALT 11  ALKPHOS 55  BILITOT 0.7  PROT 5.7*  ALBUMIN 3.0*   No results for input(s): LIPASE, AMYLASE in the last 168 hours. No results for input(s): AMMONIA in the last 168 hours. Coagulation Profile: No  results for input(s): INR, PROTIME in the last 168 hours. Cardiac Enzymes: No results for input(s): CKTOTAL, CKMB, CKMBINDEX, TROPONINI in the last 168 hours. BNP (last 3 results) No results for input(s): PROBNP in the last 8760 hours. HbA1C: No results for input(s): HGBA1C in the last 72 hours. CBG: No results for input(s): GLUCAP in the last 168 hours. Lipid Profile: No results for input(s): CHOL, HDL, LDLCALC, TRIG, CHOLHDL, LDLDIRECT in the last 72 hours. Thyroid Function Tests: No results for input(s): TSH, T4TOTAL, FREET4, T3FREE, THYROIDAB in the last 72 hours. Anemia Panel: No results for input(s): VITAMINB12, FOLATE, FERRITIN, TIBC, IRON, RETICCTPCT in the last 72 hours. Urine analysis:    Component Value Date/Time   COLORURINE AMBER (A) 02/22/2020 1751   APPEARANCEUR TURBID (A) 02/22/2020 1751   APPEARANCEUR Clear 06/12/2014 1029   LABSPEC 1.018 02/22/2020 1751   LABSPEC 1.005 06/12/2014 1029   PHURINE 5.0 02/22/2020 1751   GLUCOSEU NEGATIVE 02/22/2020 1751   GLUCOSEU Negative 06/12/2014 1029   HGBUR NEGATIVE 02/22/2020 1751   BILIRUBINUR NEGATIVE 02/22/2020 1751   BILIRUBINUR Negative 06/12/2014 1029   KETONESUR NEGATIVE 02/22/2020 1751   PROTEINUR 100 (A) 02/22/2020 1751   NITRITE NEGATIVE 02/22/2020 1751   LEUKOCYTESUR LARGE (A) 02/22/2020 1751   LEUKOCYTESUR Negative 06/12/2014 1029   Recent Results (from the past 240 hour(s))  SARS CORONAVIRUS 2 (TAT 6-24 HRS) Nasopharyngeal Nasopharyngeal Swab     Status: None   Collection Time: 02/22/20  2:53 PM   Specimen: Nasopharyngeal Swab  Result Value Ref Range Status   SARS Coronavirus 2 NEGATIVE NEGATIVE Final    Comment: (NOTE) SARS-CoV-2 target nucleic acids are NOT DETECTED. The SARS-CoV-2 RNA is generally detectable in upper and lower respiratory specimens during the acute phase of infection. Negative results do not preclude SARS-CoV-2 infection, do not rule out co-infections with other pathogens, and should  not be used as the sole basis for treatment or other patient management decisions. Negative results must be combined with clinical observations, patient history, and epidemiological information. The expected result is Negative. Fact Sheet for Patients: HairSlick.no Fact Sheet for Healthcare Providers: quierodirigir.com This test is not yet approved or cleared by the [place] FDA and  has been authorized for detection and/or diagnosis of SARS-CoV-2 by FDA under an Emergency Use Authorization (EUA). This EUA will remain  in effect (meaning this test can be used) for the duration of the COVID-19 declaration under Section 56 4(b)(1) of the Act, 21 U.S.C. section 360bbb-3(b)(1), unless the authorization is terminated or revoked sooner. Performed at [facility], [address], [place], [place] [address]       Radiology Studies: CT HEAD WO CONTRAST  Result Date: 02/22/2020 CLINICAL DATA:  New onset seizure today. History of alcohol abuse. EXAM: CT HEAD WITHOUT CONTRAST TECHNIQUE: Contiguous axial images were obtained from the base of the skull through the vertex without intravenous contrast. COMPARISON:  Head CT 12/02/2019 FINDINGS: Brain:  Generalized atrophy is cerebral volume loss, stable from prior exam. No intracranial hemorrhage, mass effect, or midline shift. No hydrocephalus. The basilar cisterns are patent. Moderate periventricular white matter hypodensity consistent with chronic small vessel ischemia, unchanged. No acute pontine abnormalities. No evidence of territorial infarct or acute ischemia. No extra-axial or intracranial fluid collection. Vascular: Atherosclerosis of skullbase vasculature without hyperdense vessel or abnormal calcification. Skull: No fracture or focal lesion. Sinuses/Orbits: Paranasal sinuses and mastoid air cells are clear. The visualized orbits are unremarkable. Other: Soft tissue nodule in the right  occipital scalp is unchanged from prior, may be a sebaceous cyst or sequela of remote injury. IMPRESSION: No acute intracranial abnormality. Stable atrophy and chronic small vessel ischemia. Electronically Signed   By: Narda Rutherford M.D.   On: 02/22/2020 14:15    Scheduled Meds: . atorvastatin  20 mg Oral QHS  . donepezil  10 mg Oral QHS  . enoxaparin (LOVENOX) injection  40 mg Subcutaneous Q24H  . escitalopram  10 mg Oral Daily  . folic acid  1 mg Oral Daily  . levETIRAcetam  500 mg Oral BID  . LORazepam  0-4 mg Intravenous Q6H   Or  . LORazepam  0-4 mg Oral Q6H  . [START ON 02/24/2020] LORazepam  0-4 mg Intravenous Q12H   Or  . [START ON 02/24/2020] LORazepam  0-4 mg Oral Q12H  . multivitamin with minerals  1 tablet Oral Daily  . pantoprazole  40 mg Oral Daily  . thiamine  100 mg Oral Daily  . traZODone  200 mg Oral QHS   Continuous Infusions: . sodium chloride 75 mL/hr (02/23/20 0500)  . cefTRIAXone (ROCEPHIN)  IV       LOS: 1 day   Time spent: 25 minutes.  Tyrone Nine, MD [facility] [email or web address] 02/23/2020, 3:49 PM

## 2020-02-24 LAB — BASIC METABOLIC PANEL
Anion gap: 6 (ref 5–15)
BUN: 13 mg/dL (ref 8–23)
CO2: 25 mmol/L (ref 22–32)
Calcium: 9.1 mg/dL (ref 8.9–10.3)
Chloride: 107 mmol/L (ref 98–111)
Creatinine, Ser: 0.99 mg/dL (ref 0.61–1.24)
GFR calc Af Amer: >60 mL/min (ref >60)
GFR calc non Af Amer: >60 mL/min (ref >60)
Glucose, Bld: 86 mg/dL (ref 70–99)
Potassium: 3.8 mmol/L (ref 3.5–5.1)
Sodium: 138 mmol/L (ref 135–145)

## 2020-02-24 LAB — CBC WITH DIFFERENTIAL/PLATELET
Abs Immature Granulocytes: 0.04 10*3/uL (ref 0.00–0.07)
Basophils Absolute: 0.1 10*3/uL (ref 0.0–0.1)
Basophils Relative: 1 %
Eosinophils Absolute: 0.4 10*3/uL (ref 0.0–0.5)
Eosinophils Relative: 4 %
HCT: 32.7 % — ABNORMAL LOW (ref 39.0–52.0)
Hemoglobin: 10.7 g/dL — ABNORMAL LOW (ref 13.0–17.0)
Immature Granulocytes: 0 %
Lymphocytes Relative: 16 %
Lymphs Abs: 1.7 10*3/uL (ref 0.7–4.0)
MCH: 32.8 pg (ref 26.0–34.0)
MCHC: 32.7 g/dL (ref 30.0–36.0)
MCV: 100.3 fL — ABNORMAL HIGH (ref 80.0–100.0)
Monocytes Absolute: 0.8 10*3/uL (ref 0.1–1.0)
Monocytes Relative: 8 %
Neutro Abs: 7.5 10*3/uL (ref 1.7–7.7)
Neutrophils Relative %: 71 %
Platelets: 196 10*3/uL (ref 150–400)
RBC: 3.26 MIL/uL — ABNORMAL LOW (ref 4.22–5.81)
RDW: 18.8 % — ABNORMAL HIGH (ref 11.5–15.5)
WBC: 10.5 10*3/uL (ref 4.0–10.5)
nRBC: 0 % (ref 0.0–0.2)

## 2020-02-24 LAB — MAGNESIUM: Magnesium: 1.4 mg/dL — ABNORMAL LOW (ref 1.7–2.4)

## 2020-02-24 MED ORDER — MAGNESIUM SULFATE 2 GM/50ML IV SOLN: 2.0000 g | Freq: Once | INTRAVENOUS | Status: DC

## 2020-02-24 MED ORDER — CEPHALEXIN 500 MG PO CAPS: 500.0000 mg | ORAL_CAPSULE | Freq: Two times a day (BID) | ORAL | 0 refills | Status: DC

## 2020-02-24 MED ORDER — LEVETIRACETAM 500 MG PO TABS: 500.0000 mg | ORAL_TABLET | Freq: Two times a day (BID) | ORAL | 0 refills | Status: DC

## 2020-02-24 NOTE — Progress Notes (Signed)
Subjective: No further seizure noted.  Caregiver reports patient is at baseline.  Appears to be tolerating Keppra well.    Objective: Current vital signs: BP (!) 171/69 (BP Location: Left Arm)   Pulse 75   Temp 99 F (37.2 C) (Oral)   Resp 15   Ht 5' 4.5" (1.638 m)   Wt 63.5 kg   SpO2 92%   BMI 23.66 kg/m  Vital signs in last 24 hours: Temp:  [98.6 F (37 C)-99.2 F (37.3 C)] 99 F (37.2 C) (04/01 0810) Pulse Rate:  [58-75] 75 (04/01 0810) Resp:  [15-22] 15 (04/01 0810) BP: (103-171)/(69-118) 171/69 (04/01 0810) SpO2:  [92 %-100 %] 92 % (04/01 0810)  Intake/Output from previous day: 03/31 0701 - 04/01 0700 In: 757.9 [I.V.:657.9; IV Piggyback:100] Out: -  Intake/Output this shift: Total I/O In: -  Out: 625 [Urine:625] Nutritional status:  Diet Order            Diet regular Room service appropriate? Yes; Fluid consistency: Thin  Diet effective now              Neurologic Exam: Mental Status: Alert.  Speech fluent without evidence of aphasia.  Able to follow simple commands without difficulty but has difficulty with 3-step commands.   Cranial Nerves: II: Visual fields grossly normal, pupils equal, round, reactive to light and accommodation III,IV, VI: ptosis not present, extra-ocular motions intact bilaterally V,VII: decreased left NLF, facial light touch sensation normal bilaterally VIII: hearing normal bilaterally IX,X: gag reflex present XI: bilateral shoulder shrug XII: midline tongue extension Motor: 5/5 throughout    Lab Results: Basic Metabolic Panel: Recent Labs  Lab 02/22/20 1303 02/23/20 0422 02/24/20 0401  NA 142 142 138  K 2.5* 3.3* 3.8  CL 104 110 107  CO2 22 25 25   GLUCOSE 93 93 86  BUN 13 16 13   CREATININE 1.30* 0.99 0.99  CALCIUM 8.3* 8.6* 9.1  MG 1.5*  --  1.4*    Liver Function Tests: Recent Labs  Lab 02/22/20 1303  AST 21  ALT 11  ALKPHOS 55  BILITOT 0.7  PROT 5.7*  ALBUMIN 3.0*   No results for input(s): LIPASE,  AMYLASE in the last 168 hours. No results for input(s): AMMONIA in the last 168 hours.  CBC: Recent Labs  Lab 02/22/20 1303 02/23/20 0422 02/24/20 0401  WBC 8.2 9.7 10.5  NEUTROABS 5.1 6.6 7.5  HGB 12.5* 10.2* 10.7*  HCT 36.4* 30.6* 32.7*  MCV 96.0 98.7 100.3*  PLT 222 197 196    Cardiac Enzymes: No results for input(s): CKTOTAL, CKMB, CKMBINDEX, TROPONINI in the last 168 hours.  Lipid Panel: No results for input(s): CHOL, TRIG, HDL, CHOLHDL, VLDL, LDLCALC in the last 168 hours.  CBG: No results for input(s): GLUCAP in the last 168 hours.  Microbiology: Results for orders placed or performed during the hospital encounter of 02/22/20  SARS CORONAVIRUS 2 (TAT 6-24 HRS) Nasopharyngeal Nasopharyngeal Swab     Status: None   Collection Time: 02/22/20  2:53 PM   Specimen: Nasopharyngeal Swab  Result Value Ref Range Status   SARS Coronavirus 2 NEGATIVE NEGATIVE Final    Comment: (NOTE) SARS-CoV-2 target nucleic acids are NOT DETECTED. The SARS-CoV-2 RNA is generally detectable in upper and lower respiratory specimens during the acute phase of infection. Negative results do not preclude SARS-CoV-2 infection, do not rule out co-infections with other pathogens, and should not be used as the sole basis for treatment or other patient management decisions. Negative results  must be combined with clinical observations, patient history, and epidemiological information. The expected result is Negative. Fact Sheet for Patients: HairSlick.no Fact Sheet for Healthcare Providers: quierodirigir.com This test is not yet approved or cleared by the [place] FDA and  has been authorized for detection and/or diagnosis of SARS-CoV-2 by FDA under an Emergency Use Authorization (EUA). This EUA will remain  in effect (meaning this test can be used) for the duration of the COVID-19 declaration under Section 56 4(b)(1) of the Act, 21  U.S.C. section 360bbb-3(b)(1), unless the authorization is terminated or revoked sooner. Performed at [facility], [address], [place], [place] [address]     Coagulation Studies: No results for input(s): LABPROT, INR in the last 72 hours.  Imaging: EEG  Result Date: 02/23/2020 Thana Farr, MD     02/23/2020  6:37 PM ELECTROENCEPHALOGRAM REPORT Patient: TREVEN HOLTMAN       Room #: 104A-AA EEG No. ID: [id number] Age: 79 y.o.        Sex: male Requesting Physician: Jarvis Newcomer Report Date:  02/23/2020       Interpreting Physician: Thana Farr History: ALEJO BEAMER is an 79 y.o. male with new onset seizure Medications: Lexapro, Lipitor, Rocephin, Aricept, Keppra, Trazodone, Thiamine Conditions of Recording:  This is a 21 channel routine scalp EEG performed with bipolar and monopolar montages arranged in accordance to the international 10/20 system of electrode placement. One channel was dedicated to EKG recording. The patient is in the awake, drowsy and asleep states. Description:  The patient is awake briefly.  During wakefulness background activity consists of a low voltage, symmetrical, fairly well organized, 8 Hz alpha activity, seen from the parieto-occipital and posterior temporal regions.  Low voltage fast activity, poorly organized, is seen anteriorly and is at times superimposed on more posterior regions.  A mixture of theta and alpha rhythms are seen from the central and temporal regions. The patient drowses with slowing to irregular, low voltage theta and beta activity.  The patient goes in to a light sleep with symmetrical sleep spindles, vertex central sharp transients and irregular slow activity.  No epileptiform activity is noted.  Hyperventilation was not performed.  Intermittent photic stimulation was performed but failed to illicit any change in the tracing. IMPRESSION: Normal electroencephalogram, awake, asleep and with activation procedures. There are no focal  lateralizing or epileptiform features. Thana Farr, MD Neurology [phone] 02/23/2020, 6:32 PM   CT HEAD WO CONTRAST  Result Date: 02/22/2020 CLINICAL DATA:  New onset seizure today. History of alcohol abuse. EXAM: CT HEAD WITHOUT CONTRAST TECHNIQUE: Contiguous axial images were obtained from the base of the skull through the vertex without intravenous contrast. COMPARISON:  Head CT 12/02/2019 FINDINGS: Brain: Generalized atrophy is cerebral volume loss, stable from prior exam. No intracranial hemorrhage, mass effect, or midline shift. No hydrocephalus. The basilar cisterns are patent. Moderate periventricular white matter hypodensity consistent with chronic small vessel ischemia, unchanged. No acute pontine abnormalities. No evidence of territorial infarct or acute ischemia. No extra-axial or intracranial fluid collection. Vascular: Atherosclerosis of skullbase vasculature without hyperdense vessel or abnormal calcification. Skull: No fracture or focal lesion. Sinuses/Orbits: Paranasal sinuses and mastoid air cells are clear. The visualized orbits are unremarkable. Other: Soft tissue nodule in the right occipital scalp is unchanged from prior, may be a sebaceous cyst or sequela of remote injury. IMPRESSION: No acute intracranial abnormality. Stable atrophy and chronic small vessel ischemia. Electronically Signed   By: Narda Rutherford M.D.   On:  02/22/2020 14:15   MR BRAIN W WO CONTRAST  Result Date: 02/23/2020 CLINICAL DATA:  Seizures. Clear EXAM: MRI HEAD WITHOUT AND WITH CONTRAST TECHNIQUE: Multiplanar, multiecho pulse sequences of the brain and surrounding structures were obtained without and with intravenous contrast. CONTRAST:  34mL GADAVIST GADOBUTROL 1 MMOL/ML IV SOLN COMPARISON:  None. FINDINGS: Brain: No acute infarction, hemorrhage, hydrocephalus, extra-axial collection or mass lesion. Scattered foci of T2 hyperintensity are seen within the white matter of the cerebral hemispheres,  nonspecific, likely related to chronic small vessel ischemia remote lacunar infarcts are noted in the bilateral basal ganglia, left prefrontal gyrus and right side of the pons. The hippocampi are symmetric with normal signal characteristics. No focus of abnormal contrast enhancement. Vascular: Normal flow voids. Susceptibility artifact noted around the left vertebral artery, likely corresponding to atherosclerotic disease. Skull and upper cervical spine: Normal marrow signal. Sinuses/Orbits: Mucosal thickening the bilateral ethmoid cells. The orbits are maintained. Other: None. IMPRESSION: 1. No acute intracranial abnormality or abnormal contrast enhancement. 2. Scattered foci of T2 hyperintensity are seen within the white matter of the cerebral hemispheres, nonspecific, but likely related to chronic small vessel ischemia. 3. Chronic lacunar infarcts in the bilateral basal ganglia, left prefrontal gyrus and right side of the pons. Electronically Signed   By: Pedro Earls M.D.   On: 02/23/2020 15:35    Medications:  I have reviewed the patient's current medications. Scheduled: . atorvastatin  20 mg Oral QHS  . donepezil  10 mg Oral QHS  . enoxaparin (LOVENOX) injection  40 mg Subcutaneous Q24H  . escitalopram  10 mg Oral Daily  . folic acid  1 mg Oral Daily  . levETIRAcetam  500 mg Oral BID  . LORazepam  0-4 mg Intravenous Q6H   Or  . LORazepam  0-4 mg Oral Q6H  . LORazepam  0-4 mg Intravenous Q12H   Or  . LORazepam  0-4 mg Oral Q12H  . multivitamin with minerals  1 tablet Oral Daily  . pantoprazole  40 mg Oral Daily  . thiamine  100 mg Oral Daily  . traZODone  200 mg Oral QHS    Assessment/Plan: 79 year old male with medical history significant ofhistory of GERD, alcohol abuse who presented to the emergency department for seizure.  Patient with no prior history of seizure.  ETOH 31 on presentation which goes against ETOH withdrawal seizure.  Patient also with a history of  dementia and concern for described focality of seizures.  MRI of the brain personally reviewed and shows no acute changes.  EEG shows no epileptiform activity.   No further seizures noted.  At baseline per caregiver.  Appears to be tolerating Keppra well.      Recommendations: 1. Keppra 500mg  po BID to be continued.  May follow up with neurology as an outpatient. 2. Patient unable to drive, operate heavy machinery, perform activities at heights and participate in water activities until release by outpatient physician.  No further neurologic intervention is recommended at this time.  If further questions arise, please call or page at that time.  Thank you for allowing neurology to participate in the care of this patient.    LOS: 2 days   Alexis Goodell, MD Neurology [phone] 02/24/2020  10:45 AM

## 2020-02-24 NOTE — Progress Notes (Signed)
Patient discharged home. Transported via POV by neighbor/caregiver Renaldo Reel. Discharge paperwork reviewed with patient and caregiver. Patient and caregiver verbalized and demonstrated understanding.

## 2020-02-24 NOTE — Plan of Care (Signed)

## 2020-02-24 NOTE — Progress Notes (Signed)
MEDICATION RELATED CONSULT NOTE - INITIAL   Pharmacy Consult for monitoring for DDI with anti-epileptic medications Indication: seizures  No Known Allergies  Patient Measurements: Height: 5' 4.5" (163.8 cm) Weight: 63.5 kg (140 lb) IBW/kg (Calculated) : 60.35 Adjusted Body Weight:    Vital Signs: Temp: 99 F (37.2 C) (04/01 0810) Temp Source: Oral (04/01 0810) BP: 171/69 (04/01 0810) Pulse Rate: 75 (04/01 0810) Intake/Output from previous day: 03/31 0701 - 04/01 0700 In: 757.9 [I.V.:657.9; IV Piggyback:100] Out: -  Intake/Output from this shift: Total I/O In: -  Out: 625 [Urine:625]  Labs: Recent Labs    02/22/20 1303 02/23/20 0422 02/24/20 0401  WBC 8.2 9.7 10.5  HGB 12.5* 10.2* 10.7*  HCT 36.4* 30.6* 32.7*  PLT 222 197 196  CREATININE 1.30* 0.99 0.99  MG 1.5*  --  1.4*  ALBUMIN 3.0*  --   --   PROT 5.7*  --   --   AST 21  --   --   ALT 11  --   --   ALKPHOS 55  --   --   BILITOT 0.7  --   --    Estimated Creatinine Clearance: 50.8 mL/min (by C-G formula based on SCr of 0.99 mg/dL).   Microbiology: Recent Results (from the past 720 hour(s))  SARS CORONAVIRUS 2 (TAT 6-24 HRS) Nasopharyngeal Nasopharyngeal Swab     Status: None   Collection Time: 02/22/20  2:53 PM   Specimen: Nasopharyngeal Swab  Result Value Ref Range Status   SARS Coronavirus 2 NEGATIVE NEGATIVE Final    Comment: (NOTE) SARS-CoV-2 target nucleic acids are NOT DETECTED. The SARS-CoV-2 RNA is generally detectable in upper and lower respiratory specimens during the acute phase of infection. Negative results do not preclude SARS-CoV-2 infection, do not rule out co-infections with other pathogens, and should not be used as the sole basis for treatment or other patient management decisions. Negative results must be combined with clinical observations, patient history, and epidemiological information. The expected result is Negative. Fact Sheet for  Patients: HairSlick.no Fact Sheet for Healthcare Providers: quierodirigir.com This test is not yet approved or cleared by the [place] FDA and  has been authorized for detection and/or diagnosis of SARS-CoV-2 by FDA under an Emergency Use Authorization (EUA). This EUA will remain  in effect (meaning this test can be used) for the duration of the COVID-19 declaration under Section 56 4(b)(1) of the Act, 21 U.S.C. section 360bbb-3(b)(1), unless the authorization is terminated or revoked sooner. Performed at [facility], [address] [place], [place] [address]     Medical History: Past Medical History:  Diagnosis Date  . Alcohol abuse   . Dementia (HCC)   . GERD (gastroesophageal reflux disease)   . HLD (hyperlipidemia)   . HTN (hypertension)     Assessment: Patient admitted for alcohol withdrawal, had new onset of seizures. Patient is being initiated on keppra 500mg  bid. Pharmacy consulted to monitor for any DDI and adverse effects.  Plan:  Reviewed current medications, no contraindications for keppra. Will follow for any adverse events and the addition of any new medications.  , PharmD, BCPS 02/24/2020 11:55 AM

## 2020-02-24 NOTE — Discharge Summary (Signed)
Physician Discharge Summary  KHOLE BRANCH PIR:[id number] DOB: [birth year] DOA: 02/22/2020  PCP: Dion Body, MD  Admit date: 02/22/2020 Discharge date: 02/24/2020  Admitted From: Home Disposition: Home   Recommendations for Outpatient Follow-up:  1. Follow up with PCP in 1-2 weeks. 2. Starting keppra 500mg  po BID for seizure prophylaxis. 3. Continue alcohol moderation counseling.  Home Health: None Equipment/Devices: None Discharge Condition: Stable CODE STATUS: DNR Diet recommendation: Heart healthy  Brief/Interim Summary: Richard Carter is a 79 y.o. male with a history of dementia, alcohol abuse (~fifth bourbon per day), HTN, HLD, GERD who presented to the ED 3/30 due to seizures. A first episode was witnessed by his roommate, and a second by EMS who gave versed after which the patient became briefly apneic requiring bag-valve-mask ventilation, subsequently weaned back to room air. He denies recently trying to stop drinking. EtOH level was 31 in ED, UDS positive only for benzodiazepines (having been given versed). CT head without changes. Neurology was consulted. EEG was without epileptiform discharges/normal, and MRI showed no acute abnormalities. The patient remains stable with no further seizure activity since starting keppra and will be discharged 4/1.    Discharge Diagnoses:  Active Problems:   Seizure ([place])  Seizures: Concern for focality based on report, with concomitant dementia, remains at risk for recurrent seizure so AED started. EEG and MRI were without acute abnormalities. - Started keppra 500mg  po BID per neurology, will continue. - Follow up with PCP with neurology referral as needed.  Alcohol abuse: Significant intake, though has +EtOH on arrival, not consistent with withdrawal. Has not required treatment for withdrawal.  - Cessation counseling provided.  Hypokalemia: Supplemented, resolved.   AKI: Improved.   Pyuria:  - Continue keflex, urine  culture not returned at time of discharge.   Dementia, depression:  - Continue home medications including aricept, trazodone, lexapro  GERD:  - Continue PPI, prn maalox  HLD:  - Continue statin. LFTs wnl.   Discharge Instructions Discharge Instructions    Diet - low sodium heart healthy   Complete by: As directed    Discharge instructions   Complete by: As directed    You were admitted due to seizures which have resolved since starting medications. Please continue taking keppra 500mg  twice daily. You will need to remain on this medication long term, so follow up with your primary care doctor within the next couple weeks for continued care. By law, you are unable to drive, operate heavy machinery, perform activities at heights and participate in water activities until release by outpatient physician.  Due to possible urinary tract infection, complete a course of antibiotics with keflex 500mg  twice a day for 7 days.  You have fortunately not had any evidence of alcohol withdrawal and it is recommended that you slowly decrease your alcohol intake to no more than 1 drink per day.   If you or anyone around you notices any further seizure like activity, seek medical attention immediately.   Increase activity slowly   Complete by: As directed      Allergies as of 02/24/2020   No Known Allergies     Medication List    TAKE these medications   atorvastatin 20 MG tablet Commonly known as: LIPITOR Take 20 mg by mouth at bedtime.   cephALEXin 500 MG capsule Commonly known as: KEFLEX Take 1 capsule (500 mg total) by mouth 2 (two) times daily.   donepezil 10 MG tablet Commonly known as: ARICEPT Take 10 mg by mouth at  bedtime.   escitalopram 10 MG tablet Commonly known as: LEXAPRO Take 10 mg by mouth daily.   levETIRAcetam 500 MG tablet Commonly known as: KEPPRA Take 1 tablet (500 mg total) by mouth 2 (two) times daily.   magnesium oxide 400 MG tablet Commonly known as:  MAG-OX Take 1 tablet by mouth daily.   omeprazole 40 MG capsule Commonly known as: PRILOSEC Take 40 mg by mouth 2 (two) times daily.   traZODone 100 MG tablet Commonly known as: DESYREL Take 200 mg by mouth at bedtime.      Follow-up Information    Marisue Ivan, MD. Schedule an appointment as soon as possible for a visit in 1 week(s).   Specialty: Family Medicine Contact information: [address] [place] [address] [phone]          No Known Allergies  Consultations:  Neurology  Procedures/Studies: EEG  Result Date: 02/23/2020 Thana Farr, MD     02/23/2020  6:37 PM ELECTROENCEPHALOGRAM REPORT Patient: Richard Carter       Room #: 104A-AA EEG No. ID: [id number] Age: 79 y.o.        Sex: male Requesting Physician: Jarvis Newcomer Report Date:  02/23/2020       Interpreting Physician: Thana Farr History: DAXTON NYDAM is an 79 y.o. male with new onset seizure Medications: Lexapro, Lipitor, Rocephin, Aricept, Keppra, Trazodone, Thiamine Conditions of Recording:  This is a 21 channel routine scalp EEG performed with bipolar and monopolar montages arranged in accordance to the international 10/20 system of electrode placement. One channel was dedicated to EKG recording. The patient is in the awake, drowsy and asleep states. Description:  The patient is awake briefly.  During wakefulness background activity consists of a low voltage, symmetrical, fairly well organized, 8 Hz alpha activity, seen from the parieto-occipital and posterior temporal regions.  Low voltage fast activity, poorly organized, is seen anteriorly and is at times superimposed on more posterior regions.  A mixture of theta and alpha rhythms are seen from the central and temporal regions. The patient drowses with slowing to irregular, low voltage theta and beta activity.  The patient goes in to a light sleep with symmetrical sleep spindles, vertex central sharp transients and irregular slow activity.   No epileptiform activity is noted.  Hyperventilation was not performed.  Intermittent photic stimulation was performed but failed to illicit any change in the tracing. IMPRESSION: Normal electroencephalogram, awake, asleep and with activation procedures. There are no focal lateralizing or epileptiform features. Thana Farr, MD Neurology [phone] 02/23/2020, 6:32 PM   CT HEAD WO CONTRAST  Result Date: 02/22/2020 CLINICAL DATA:  New onset seizure today. History of alcohol abuse. EXAM: CT HEAD WITHOUT CONTRAST TECHNIQUE: Contiguous axial images were obtained from the base of the skull through the vertex without intravenous contrast. COMPARISON:  Head CT 12/02/2019 FINDINGS: Brain: Generalized atrophy is cerebral volume loss, stable from prior exam. No intracranial hemorrhage, mass effect, or midline shift. No hydrocephalus. The basilar cisterns are patent. Moderate periventricular white matter hypodensity consistent with chronic small vessel ischemia, unchanged. No acute pontine abnormalities. No evidence of territorial infarct or acute ischemia. No extra-axial or intracranial fluid collection. Vascular: Atherosclerosis of skullbase vasculature without hyperdense vessel or abnormal calcification. Skull: No fracture or focal lesion. Sinuses/Orbits: Paranasal sinuses and mastoid air cells are clear. The visualized orbits are unremarkable. Other: Soft tissue nodule in the right occipital scalp is unchanged from prior, may be a sebaceous cyst or sequela of remote injury. IMPRESSION: No acute  intracranial abnormality. Stable atrophy and chronic small vessel ischemia. Electronically Signed   By: Narda Rutherford M.D.   On: 02/22/2020 14:15   MR BRAIN W WO CONTRAST  Result Date: 02/23/2020 CLINICAL DATA:  Seizures. Clear EXAM: MRI HEAD WITHOUT AND WITH CONTRAST TECHNIQUE: Multiplanar, multiecho pulse sequences of the brain and surrounding structures were obtained without and with intravenous contrast. CONTRAST:   56mL GADAVIST GADOBUTROL 1 MMOL/ML IV SOLN COMPARISON:  None. FINDINGS: Brain: No acute infarction, hemorrhage, hydrocephalus, extra-axial collection or mass lesion. Scattered foci of T2 hyperintensity are seen within the white matter of the cerebral hemispheres, nonspecific, likely related to chronic small vessel ischemia remote lacunar infarcts are noted in the bilateral basal ganglia, left prefrontal gyrus and right side of the pons. The hippocampi are symmetric with normal signal characteristics. No focus of abnormal contrast enhancement. Vascular: Normal flow voids. Susceptibility artifact noted around the left vertebral artery, likely corresponding to atherosclerotic disease. Skull and upper cervical spine: Normal marrow signal. Sinuses/Orbits: Mucosal thickening the bilateral ethmoid cells. The orbits are maintained. Other: None. IMPRESSION: 1. No acute intracranial abnormality or abnormal contrast enhancement. 2. Scattered foci of T2 hyperintensity are seen within the white matter of the cerebral hemispheres, nonspecific, but likely related to chronic small vessel ischemia. 3. Chronic lacunar infarcts in the bilateral basal ganglia, left prefrontal gyrus and right side of the pons. Electronically Signed   By: Baldemar Lenis M.D.   On: 02/23/2020 15:35     Subjective: No new complaints. No chest pain or tremors.  Discharge Exam: Vitals:   02/24/20 0130 02/24/20 0810  BP: (!) 141/118 (!) 171/69  Pulse: (!) 58 75  Resp: (!) 22 15  Temp: 99.2 F (37.3 C) 99 F (37.2 C)  SpO2: 100% 92%   General: Pt is alert, awake, not in acute distress Cardiovascular: RRR, S1/S2 +, no rubs, no gallops Respiratory: CTA bilaterally, no wheezing, no rhonchi Abdominal: Soft, NT, ND, bowel sounds + Extremities: No edema, no cyanosis  Labs: BNP (last 3 results) No results for input(s): BNP in the last 8760 hours. Basic Metabolic Panel: Recent Labs  Lab 02/22/20 1303 02/23/20 0422  02/24/20 0401  NA 142 142 138  K 2.5* 3.3* 3.8  CL 104 110 107  CO2 22 25 25   GLUCOSE 93 93 86  BUN 13 16 13   CREATININE 1.30* 0.99 0.99  CALCIUM 8.3* 8.6* 9.1  MG 1.5*  --  1.4*   Liver Function Tests: Recent Labs  Lab 02/22/20 1303  AST 21  ALT 11  ALKPHOS 55  BILITOT 0.7  PROT 5.7*  ALBUMIN 3.0*   No results for input(s): LIPASE, AMYLASE in the last 168 hours. No results for input(s): AMMONIA in the last 168 hours. CBC: Recent Labs  Lab 02/22/20 1303 02/23/20 0422 02/24/20 0401  WBC 8.2 9.7 10.5  NEUTROABS 5.1 6.6 7.5  HGB 12.5* 10.2* 10.7*  HCT 36.4* 30.6* 32.7*  MCV 96.0 98.7 100.3*  PLT 222 197 196   Cardiac Enzymes: No results for input(s): CKTOTAL, CKMB, CKMBINDEX, TROPONINI in the last 168 hours. BNP: Invalid input(s): POCBNP CBG: No results for input(s): GLUCAP in the last 168 hours. D-Dimer No results for input(s): DDIMER in the last 72 hours. Hgb A1c No results for input(s): HGBA1C in the last 72 hours. Lipid Profile No results for input(s): CHOL, HDL, LDLCALC, TRIG, CHOLHDL, LDLDIRECT in the last 72 hours. Thyroid function studies No results for input(s): TSH, T4TOTAL, T3FREE, THYROIDAB in the last 72  hours.  Invalid input(s): FREET3 Anemia work up No results for input(s): VITAMINB12, FOLATE, FERRITIN, TIBC, IRON, RETICCTPCT in the last 72 hours. Urinalysis    Component Value Date/Time   COLORURINE AMBER (A) 02/22/2020 1751   APPEARANCEUR TURBID (A) 02/22/2020 1751   APPEARANCEUR Clear 06/12/2014 1029   LABSPEC 1.018 02/22/2020 1751   LABSPEC 1.005 06/12/2014 1029   PHURINE 5.0 02/22/2020 1751   GLUCOSEU NEGATIVE 02/22/2020 1751   GLUCOSEU Negative 06/12/2014 1029   HGBUR NEGATIVE 02/22/2020 1751   BILIRUBINUR NEGATIVE 02/22/2020 1751   BILIRUBINUR Negative 06/12/2014 1029   KETONESUR NEGATIVE 02/22/2020 1751   PROTEINUR 100 (A) 02/22/2020 1751   NITRITE NEGATIVE 02/22/2020 1751   LEUKOCYTESUR LARGE (A) 02/22/2020 1751    LEUKOCYTESUR Negative 06/12/2014 1029    Microbiology Recent Results (from the past 240 hour(s))  SARS CORONAVIRUS 2 (TAT 6-24 HRS) Nasopharyngeal Nasopharyngeal Swab     Status: None   Collection Time: 02/22/20  2:53 PM   Specimen: Nasopharyngeal Swab  Result Value Ref Range Status   SARS Coronavirus 2 NEGATIVE NEGATIVE Final    Comment: (NOTE) SARS-CoV-2 target nucleic acids are NOT DETECTED. The SARS-CoV-2 RNA is generally detectable in upper and lower respiratory specimens during the acute phase of infection. Negative results do not preclude SARS-CoV-2 infection, do not rule out co-infections with other pathogens, and should not be used as the sole basis for treatment or other patient management decisions. Negative results must be combined with clinical observations, patient history, and epidemiological information. The expected result is Negative. Fact Sheet for Patients: HairSlick.no Fact Sheet for Healthcare Providers: quierodirigir.com This test is not yet approved or cleared by the [place] FDA and  has been authorized for detection and/or diagnosis of SARS-CoV-2 by FDA under an Emergency Use Authorization (EUA). This EUA will remain  in effect (meaning this test can be used) for the duration of the COVID-19 declaration under Section 56 4(b)(1) of the Act, 21 U.S.C. section 360bbb-3(b)(1), unless the authorization is terminated or revoked sooner. Performed at [facility], [address], [place], [place] [address]     Time coordinating discharge: Approximately 40 minutes  Tyrone Nine, MD  [facility] 02/24/2020, 11:47 AM

## 2020-02-25 LAB — URINE CULTURE: Culture: >=100000 — AB

## 2020-03-13 DIAGNOSIS — F0280 Dementia in other diseases classified elsewhere without behavioral disturbance: Secondary | ICD-10-CM | POA: Diagnosis not present

## 2020-03-13 DIAGNOSIS — I10 Essential (primary) hypertension: Secondary | ICD-10-CM | POA: Diagnosis not present

## 2020-03-13 DIAGNOSIS — G8929 Other chronic pain: Secondary | ICD-10-CM | POA: Diagnosis not present

## 2020-03-13 DIAGNOSIS — M25551 Pain in right hip: Secondary | ICD-10-CM | POA: Diagnosis not present

## 2020-03-13 DIAGNOSIS — G309 Alzheimer's disease, unspecified: Secondary | ICD-10-CM | POA: Diagnosis not present

## 2020-03-13 DIAGNOSIS — Z87898 Personal history of other specified conditions: Secondary | ICD-10-CM | POA: Diagnosis not present

## 2020-03-13 DIAGNOSIS — R569 Unspecified convulsions: Secondary | ICD-10-CM | POA: Diagnosis not present

## 2020-03-21 DIAGNOSIS — F0150 Vascular dementia without behavioral disturbance: Secondary | ICD-10-CM | POA: Diagnosis not present

## 2020-03-21 DIAGNOSIS — R569 Unspecified convulsions: Secondary | ICD-10-CM | POA: Diagnosis not present

## 2020-03-21 DIAGNOSIS — G309 Alzheimer's disease, unspecified: Secondary | ICD-10-CM | POA: Diagnosis not present

## 2020-03-21 DIAGNOSIS — F0280 Dementia in other diseases classified elsewhere without behavioral disturbance: Secondary | ICD-10-CM | POA: Diagnosis not present

## 2020-03-21 DIAGNOSIS — Z789 Other specified health status: Secondary | ICD-10-CM | POA: Diagnosis not present

## 2020-03-29 DIAGNOSIS — R569 Unspecified convulsions: Secondary | ICD-10-CM | POA: Diagnosis not present

## 2020-05-01 DIAGNOSIS — W010XXA Fall on same level from slipping, tripping and stumbling without subsequent striking against object, initial encounter: Secondary | ICD-10-CM | POA: Diagnosis not present

## 2020-05-01 DIAGNOSIS — S299XXA Unspecified injury of thorax, initial encounter: Secondary | ICD-10-CM | POA: Diagnosis not present

## 2020-05-01 DIAGNOSIS — M545 Low back pain: Secondary | ICD-10-CM | POA: Diagnosis not present

## 2020-06-13 DIAGNOSIS — E7800 Pure hypercholesterolemia, unspecified: Secondary | ICD-10-CM | POA: Diagnosis not present

## 2020-06-13 DIAGNOSIS — I10 Essential (primary) hypertension: Secondary | ICD-10-CM | POA: Diagnosis not present

## 2020-06-13 DIAGNOSIS — F17200 Nicotine dependence, unspecified, uncomplicated: Secondary | ICD-10-CM | POA: Diagnosis not present

## 2020-06-21 ENCOUNTER — Other Ambulatory Visit
Admission: RE | Admit: 2020-06-21 | Discharge: 2020-06-21 | Disposition: A | Discharge status: Home / Self Care | Payer: Medicare HMO | Source: Ambulatory Visit | Attending: Family Medicine | Admitting: Family Medicine

## 2020-06-21 DIAGNOSIS — I4891 Unspecified atrial fibrillation: Secondary | ICD-10-CM | POA: Diagnosis not present

## 2020-06-21 DIAGNOSIS — E876 Hypokalemia: Secondary | ICD-10-CM | POA: Diagnosis not present

## 2020-06-21 DIAGNOSIS — I499 Cardiac arrhythmia, unspecified: Secondary | ICD-10-CM | POA: Insufficient documentation

## 2020-06-21 LAB — TROPONIN I (HIGH SENSITIVITY): Troponin I (High Sensitivity): 21 ng/L — ABNORMAL HIGH (ref <18)

## 2020-06-21 LAB — CKMB (ARMC ONLY): CK, MB: 1.8 ng/mL (ref 0.5–5.0)

## 2020-06-26 DIAGNOSIS — E876 Hypokalemia: Secondary | ICD-10-CM | POA: Diagnosis not present

## 2020-06-26 DIAGNOSIS — I4891 Unspecified atrial fibrillation: Secondary | ICD-10-CM | POA: Diagnosis not present

## 2020-06-27 DIAGNOSIS — I4891 Unspecified atrial fibrillation: Secondary | ICD-10-CM | POA: Diagnosis not present

## 2020-06-27 DIAGNOSIS — E7800 Pure hypercholesterolemia, unspecified: Secondary | ICD-10-CM | POA: Diagnosis not present

## 2020-06-27 DIAGNOSIS — F0150 Vascular dementia without behavioral disturbance: Secondary | ICD-10-CM | POA: Diagnosis not present

## 2020-06-27 DIAGNOSIS — Z789 Other specified health status: Secondary | ICD-10-CM | POA: Diagnosis not present

## 2020-06-27 DIAGNOSIS — F17200 Nicotine dependence, unspecified, uncomplicated: Secondary | ICD-10-CM | POA: Diagnosis not present

## 2020-06-27 DIAGNOSIS — I10 Essential (primary) hypertension: Secondary | ICD-10-CM | POA: Diagnosis not present

## 2020-06-27 DIAGNOSIS — F0280 Dementia in other diseases classified elsewhere without behavioral disturbance: Secondary | ICD-10-CM | POA: Diagnosis not present

## 2020-06-27 DIAGNOSIS — G309 Alzheimer's disease, unspecified: Secondary | ICD-10-CM | POA: Diagnosis not present

## 2020-07-10 DIAGNOSIS — R569 Unspecified convulsions: Secondary | ICD-10-CM | POA: Diagnosis not present

## 2020-07-10 DIAGNOSIS — F0280 Dementia in other diseases classified elsewhere without behavioral disturbance: Secondary | ICD-10-CM | POA: Diagnosis not present

## 2020-07-10 DIAGNOSIS — G309 Alzheimer's disease, unspecified: Secondary | ICD-10-CM | POA: Diagnosis not present

## 2020-07-10 DIAGNOSIS — E569 Vitamin deficiency, unspecified: Secondary | ICD-10-CM | POA: Diagnosis not present

## 2020-07-10 DIAGNOSIS — Z79899 Other long term (current) drug therapy: Secondary | ICD-10-CM | POA: Diagnosis not present

## 2020-07-10 DIAGNOSIS — Z789 Other specified health status: Secondary | ICD-10-CM | POA: Diagnosis not present

## 2020-07-10 DIAGNOSIS — F0150 Vascular dementia without behavioral disturbance: Secondary | ICD-10-CM | POA: Diagnosis not present

## 2020-07-25 DIAGNOSIS — I4891 Unspecified atrial fibrillation: Secondary | ICD-10-CM | POA: Diagnosis not present

## 2020-07-25 DIAGNOSIS — I10 Essential (primary) hypertension: Secondary | ICD-10-CM | POA: Diagnosis not present

## 2020-07-25 DIAGNOSIS — Z789 Other specified health status: Secondary | ICD-10-CM | POA: Diagnosis not present

## 2020-11-29 ENCOUNTER — Emergency Department: Payer: Medicare HMO

## 2020-11-29 ENCOUNTER — Inpatient Hospital Stay: Payer: Medicare HMO

## 2020-11-29 ENCOUNTER — Inpatient Hospital Stay
Admission: EM | Admit: 2020-11-29 | Discharge: 2020-12-01 | DRG: 308 | Disposition: A | Discharge status: Home / Self Care | Payer: Medicare HMO | Attending: Family Medicine | Admitting: Family Medicine

## 2020-11-29 ENCOUNTER — Other Ambulatory Visit: Payer: Self-pay

## 2020-11-29 DIAGNOSIS — Z20822 Contact with and (suspected) exposure to covid-19: Secondary | ICD-10-CM | POA: Diagnosis present

## 2020-11-29 DIAGNOSIS — Z6821 Body mass index (BMI) 21.0-21.9, adult: Secondary | ICD-10-CM

## 2020-11-29 DIAGNOSIS — Z823 Family history of stroke: Secondary | ICD-10-CM

## 2020-11-29 DIAGNOSIS — R7989 Other specified abnormal findings of blood chemistry: Secondary | ICD-10-CM | POA: Diagnosis present

## 2020-11-29 DIAGNOSIS — I4821 Permanent atrial fibrillation: Principal | ICD-10-CM | POA: Diagnosis present

## 2020-11-29 DIAGNOSIS — Z87891 Personal history of nicotine dependence: Secondary | ICD-10-CM | POA: Diagnosis not present

## 2020-11-29 DIAGNOSIS — Z809 Family history of malignant neoplasm, unspecified: Secondary | ICD-10-CM

## 2020-11-29 DIAGNOSIS — Z66 Do not resuscitate: Secondary | ICD-10-CM | POA: Diagnosis not present

## 2020-11-29 DIAGNOSIS — I959 Hypotension, unspecified: Secondary | ICD-10-CM | POA: Diagnosis present

## 2020-11-29 DIAGNOSIS — M4856XA Collapsed vertebra, not elsewhere classified, lumbar region, initial encounter for fracture: Secondary | ICD-10-CM | POA: Diagnosis not present

## 2020-11-29 DIAGNOSIS — E46 Unspecified protein-calorie malnutrition: Secondary | ICD-10-CM | POA: Diagnosis present

## 2020-11-29 DIAGNOSIS — K219 Gastro-esophageal reflux disease without esophagitis: Secondary | ICD-10-CM | POA: Diagnosis present

## 2020-11-29 DIAGNOSIS — I10 Essential (primary) hypertension: Secondary | ICD-10-CM | POA: Diagnosis present

## 2020-11-29 DIAGNOSIS — R8271 Bacteriuria: Secondary | ICD-10-CM | POA: Diagnosis present

## 2020-11-29 DIAGNOSIS — R2681 Unsteadiness on feet: Secondary | ICD-10-CM | POA: Diagnosis present

## 2020-11-29 DIAGNOSIS — R569 Unspecified convulsions: Secondary | ICD-10-CM

## 2020-11-29 DIAGNOSIS — R0602 Shortness of breath: Secondary | ICD-10-CM | POA: Diagnosis not present

## 2020-11-29 DIAGNOSIS — Z9114 Patient's other noncompliance with medication regimen: Secondary | ICD-10-CM | POA: Diagnosis not present

## 2020-11-29 DIAGNOSIS — N179 Acute kidney failure, unspecified: Secondary | ICD-10-CM

## 2020-11-29 DIAGNOSIS — Z79899 Other long term (current) drug therapy: Secondary | ICD-10-CM

## 2020-11-29 DIAGNOSIS — E785 Hyperlipidemia, unspecified: Secondary | ICD-10-CM | POA: Diagnosis present

## 2020-11-29 DIAGNOSIS — F1010 Alcohol abuse, uncomplicated: Secondary | ICD-10-CM | POA: Diagnosis present

## 2020-11-29 DIAGNOSIS — E876 Hypokalemia: Secondary | ICD-10-CM | POA: Diagnosis present

## 2020-11-29 DIAGNOSIS — I4892 Unspecified atrial flutter: Secondary | ICD-10-CM | POA: Diagnosis not present

## 2020-11-29 DIAGNOSIS — F0390 Unspecified dementia without behavioral disturbance: Secondary | ICD-10-CM | POA: Diagnosis present

## 2020-11-29 DIAGNOSIS — N3000 Acute cystitis without hematuria: Secondary | ICD-10-CM | POA: Diagnosis present

## 2020-11-29 DIAGNOSIS — M48061 Spinal stenosis, lumbar region without neurogenic claudication: Secondary | ICD-10-CM | POA: Diagnosis present

## 2020-11-29 DIAGNOSIS — D7589 Other specified diseases of blood and blood-forming organs: Secondary | ICD-10-CM | POA: Diagnosis present

## 2020-11-29 DIAGNOSIS — J189 Pneumonia, unspecified organism: Secondary | ICD-10-CM | POA: Diagnosis present

## 2020-11-29 DIAGNOSIS — M545 Low back pain, unspecified: Secondary | ICD-10-CM | POA: Diagnosis not present

## 2020-11-29 DIAGNOSIS — M5126 Other intervertebral disc displacement, lumbar region: Secondary | ICD-10-CM | POA: Diagnosis present

## 2020-11-29 LAB — BRAIN NATRIURETIC PEPTIDE: B Natriuretic Peptide: 205.7 pg/mL — ABNORMAL HIGH (ref 0.0–100.0)

## 2020-11-29 LAB — CBC
HCT: 40.4 % (ref 39.0–52.0)
Hemoglobin: 14.3 g/dL (ref 13.0–17.0)
MCH: 38.6 pg — ABNORMAL HIGH (ref 26.0–34.0)
MCHC: 35.4 g/dL (ref 30.0–36.0)
MCV: 109.2 fL — ABNORMAL HIGH (ref 80.0–100.0)
Platelets: 291 10*3/uL (ref 150–400)
RBC: 3.7 MIL/uL — ABNORMAL LOW (ref 4.22–5.81)
RDW: 16 % — ABNORMAL HIGH (ref 11.5–15.5)
WBC: 11.1 10*3/uL — ABNORMAL HIGH (ref 4.0–10.5)
nRBC: 0 % (ref 0.0–0.2)

## 2020-11-29 LAB — URINALYSIS, COMPLETE (UACMP) WITH MICROSCOPIC
Bilirubin Urine: NEGATIVE
Glucose, UA: NEGATIVE mg/dL
Hgb urine dipstick: NEGATIVE
Ketones, ur: NEGATIVE mg/dL
Nitrite: POSITIVE — AB
Protein, ur: 30 mg/dL — AB
Specific Gravity, Urine: 1.015 (ref 1.005–1.030)
pH: 7 (ref 5.0–8.0)

## 2020-11-29 LAB — MAGNESIUM: Magnesium: 1.1 mg/dL — ABNORMAL LOW (ref 1.7–2.4)

## 2020-11-29 LAB — BASIC METABOLIC PANEL
Anion gap: 16 — ABNORMAL HIGH (ref 5–15)
BUN: 20 mg/dL (ref 8–23)
CO2: 31 mmol/L (ref 22–32)
Calcium: 10.2 mg/dL (ref 8.9–10.3)
Chloride: 94 mmol/L — ABNORMAL LOW (ref 98–111)
Creatinine, Ser: 1.36 mg/dL — ABNORMAL HIGH (ref 0.61–1.24)
GFR, Estimated: 52 mL/min — ABNORMAL LOW (ref >60)
Glucose, Bld: 148 mg/dL — ABNORMAL HIGH (ref 70–99)
Potassium: 3.8 mmol/L (ref 3.5–5.1)
Sodium: 141 mmol/L (ref 135–145)

## 2020-11-29 LAB — HEPATIC FUNCTION PANEL
ALT: 20 U/L (ref 0–44)
AST: 47 U/L — ABNORMAL HIGH (ref 15–41)
Albumin: 3.5 g/dL (ref 3.5–5.0)
Alkaline Phosphatase: 64 U/L (ref 38–126)
Bilirubin, Direct: 0.1 mg/dL (ref 0.0–0.2)
Indirect Bilirubin: 0.7 mg/dL (ref 0.3–0.9)
Total Bilirubin: 0.8 mg/dL (ref 0.3–1.2)
Total Protein: 7 g/dL (ref 6.5–8.1)

## 2020-11-29 LAB — CK: Total CK: 50 U/L (ref 49–397)

## 2020-11-29 LAB — POC SARS CORONAVIRUS 2 AG -  ED: SARS Coronavirus 2 Ag: NEGATIVE

## 2020-11-29 LAB — TSH: TSH: 0.621 u[IU]/mL (ref 0.350–4.500)

## 2020-11-29 LAB — RESP PANEL BY RT-PCR (FLU A&B, COVID) ARPGX2
Influenza A by PCR: NEGATIVE
Influenza B by PCR: NEGATIVE
SARS Coronavirus 2 by RT PCR: NEGATIVE

## 2020-11-29 LAB — PROCALCITONIN: Procalcitonin: <0.1 ng/mL

## 2020-11-29 LAB — LACTIC ACID, PLASMA
Lactic Acid, Venous: 2.8 mmol/L (ref 0.5–1.9)
Lactic Acid, Venous: 3 mmol/L (ref 0.5–1.9)
Lactic Acid, Venous: 1.8 mmol/L (ref 0.5–1.9)

## 2020-11-29 LAB — TROPONIN I (HIGH SENSITIVITY)
Troponin I (High Sensitivity): 70 ng/L — ABNORMAL HIGH (ref <18)
Troponin I (High Sensitivity): 76 ng/L — ABNORMAL HIGH (ref <18)
Troponin I (High Sensitivity): 83 ng/L — ABNORMAL HIGH (ref <18)

## 2020-11-29 MED ORDER — SODIUM CHLORIDE 0.9 % IV BOLUS
1000.0000 mL | Freq: Once | INTRAVENOUS | Status: AC
  Administered 2020-11-29: 1000 mL via INTRAVENOUS

## 2020-11-29 MED ORDER — HEPARIN SODIUM (PORCINE) 5000 UNIT/ML IJ SOLN
5000.0000 [IU] | Freq: Three times a day (TID) | INTRAMUSCULAR | Status: DC
  Administered 2020-11-29 – 2020-11-30 (×4): 5000 [IU] via SUBCUTANEOUS
  Filled 2020-11-29 (×4): qty 1

## 2020-11-29 MED ORDER — LORAZEPAM 1 MG PO TABS: 1.0000 mg | ORAL_TABLET | ORAL | Status: DC | PRN

## 2020-11-29 MED ORDER — LORAZEPAM 2 MG/ML IJ SOLN
1.0000 mg | INTRAMUSCULAR | Status: DC | PRN
  Administered 2020-11-30 – 2020-12-01 (×2): 2 mg via INTRAVENOUS
  Filled 2020-11-29: qty 2
  Filled 2020-11-29 (×2): qty 1

## 2020-11-29 MED ORDER — MAGNESIUM SULFATE 2 GM/50ML IV SOLN
2.0000 g | Freq: Once | INTRAVENOUS | Status: AC
  Administered 2020-11-29: 2 g via INTRAVENOUS
  Filled 2020-11-29: qty 50

## 2020-11-29 MED ORDER — DILTIAZEM HCL 25 MG/5ML IV SOLN
10.0000 mg | Freq: Once | INTRAVENOUS | Status: AC
  Administered 2020-11-29: 10 mg via INTRAVENOUS
  Filled 2020-11-29: qty 5

## 2020-11-29 MED ORDER — NICOTINE 21 MG/24HR TD PT24
21.0000 mg | MEDICATED_PATCH | Freq: Every day | TRANSDERMAL | Status: DC
  Administered 2020-11-29 – 2020-12-01 (×3): 21 mg via TRANSDERMAL
  Filled 2020-11-29 (×3): qty 1

## 2020-11-29 MED ORDER — ADULT MULTIVITAMIN W/MINERALS CH
1.0000 | ORAL_TABLET | Freq: Every day | ORAL | Status: DC
  Administered 2020-11-29 – 2020-12-01 (×3): 1 via ORAL
  Filled 2020-11-29 (×3): qty 1

## 2020-11-29 MED ORDER — SODIUM CHLORIDE 0.9 % IV BOLUS: 1000.0000 mL | Freq: Once | INTRAVENOUS | Status: DC

## 2020-11-29 MED ORDER — AZITHROMYCIN 250 MG PO TABS
250.0000 mg | ORAL_TABLET | Freq: Every day | ORAL | Status: DC
  Administered 2020-11-29 – 2020-12-01 (×3): 250 mg via ORAL
  Filled 2020-11-29 (×3): qty 1

## 2020-11-29 MED ORDER — PIPERACILLIN-TAZOBACTAM 3.375 G IVPB 30 MIN
3.3750 g | Freq: Once | INTRAVENOUS | Status: AC
  Administered 2020-11-29: 3.375 g via INTRAVENOUS
  Filled 2020-11-29: qty 50

## 2020-11-29 MED ORDER — DILTIAZEM HCL ER COATED BEADS 120 MG PO CP24
120.0000 mg | ORAL_CAPSULE | Freq: Every day | ORAL | Status: DC
  Administered 2020-11-29 – 2020-12-01 (×3): 120 mg via ORAL
  Filled 2020-11-29 (×4): qty 1

## 2020-11-29 MED ORDER — LEVETIRACETAM 500 MG PO TABS
500.0000 mg | ORAL_TABLET | Freq: Two times a day (BID) | ORAL | Status: DC
  Administered 2020-11-30 – 2020-12-01 (×3): 500 mg via ORAL
  Filled 2020-11-29 (×3): qty 1

## 2020-11-29 MED ORDER — THIAMINE HCL 100 MG/ML IJ SOLN
100.0000 mg | Freq: Three times a day (TID) | INTRAMUSCULAR | Status: DC
  Administered 2020-11-29 – 2020-12-01 (×5): 100 mg via INTRAVENOUS
  Filled 2020-11-29 (×5): qty 2

## 2020-11-29 MED ORDER — FOLIC ACID 1 MG PO TABS
1.0000 mg | ORAL_TABLET | Freq: Every day | ORAL | Status: DC
  Administered 2020-11-29 – 2020-12-01 (×3): 1 mg via ORAL
  Filled 2020-11-29 (×3): qty 1

## 2020-11-29 MED ORDER — THIAMINE HCL 100 MG/ML IJ SOLN
Freq: Once | INTRAVENOUS | Status: AC
  Filled 2020-11-29: qty 1000

## 2020-11-29 MED ORDER — SODIUM CHLORIDE 0.9 % IV SOLN
1.0000 g | INTRAVENOUS | Status: DC
  Administered 2020-11-29 – 2020-11-30 (×2): 1 g via INTRAVENOUS
  Filled 2020-11-29: qty 10
  Filled 2020-11-29: qty 1
  Filled 2020-11-29: qty 10

## 2020-11-29 NOTE — Sepsis Progress Note (Signed)
Notified bedside nurse of need to draw blood cultures.  

## 2020-11-29 NOTE — ED Notes (Signed)
First RN note:  Pt brought over by [facility] for hypotension at 80/44, diaphoresis, increased falls, and ETOH. Pt here with POA.

## 2020-11-29 NOTE — H&P (Addendum)
History and Physical    JACORIE ERNSBERGER ZHG:[id number] DOB: 1941-01-28 DOA: 11/29/2020  PCP: Dion Body, MD  Patient coming from: home   Chief Complaint: malaise, weakness  HPI: Richard Carter is a 80 y.o. male with medical history significant for alcohol abuse, medication noncompliance, a fib, htn, dementia, tobacco abuse, presenting with the above.  Reports several days of feeling weak, mainly in legs but also elsewhere. No muscle pain, nothing focal. Does have bilateral low back pain. Denies fever or sick contacts. No nausea or vomiting but has decreased appetite. Denies chest pain or palpitations. No abdominal pain. No dysuria. Is not compliant with his home meds. Drinks a fifth of liquor every 1-2 days, denies history of withdrawal. No recent hospitalization or antibiotics. Feels unsteady on his feet.   ED Course:   Found to be in a flutter w/ rvr, given IV diltiazem. Also given magnesium for low mag, and zosyn for possible cepsis.   Review of Systems: As per HPI otherwise 10 point review of systems negative.    Past Medical History:  Diagnosis Date  . Alcohol abuse   . Dementia ([place])   . GERD (gastroesophageal reflux disease)   . HLD (hyperlipidemia)   . HTN (hypertension)     Past Surgical History:  Procedure Laterality Date  . skin cyst removal       reports that he has been smoking. He has never used smokeless tobacco. He reports that he does not drink alcohol. No history on file for drug use.  No Known Allergies  Family History  Problem Relation Age of Onset  . Stroke Mother   . Cancer Brother     Prior to Admission medications   Medication Sig Start Date End Date Taking? Authorizing Provider  atorvastatin (LIPITOR) 20 MG tablet Take 20 mg by mouth at bedtime. 07/09/19   [provider]  cephALEXin (KEFLEX) 500 MG capsule Take 1 capsule (500 mg total) by mouth 2 (two) times daily. 02/24/20   Patrecia Pour, MD  donepezil (ARICEPT) 10 MG tablet  Take 10 mg by mouth at bedtime. 10/29/19   [provider]  escitalopram (LEXAPRO) 10 MG tablet Take 10 mg by mouth daily. 07/13/19   [provider]  levETIRAcetam (KEPPRA) 500 MG tablet Take 1 tablet (500 mg total) by mouth 2 (two) times daily. 02/24/20   Patrecia Pour, MD  losartan (COZAAR) 100 MG tablet Take 100 mg by mouth daily. 09/07/20   [provider]  magnesium oxide (MAG-OX) 400 MG tablet Take 1 tablet by mouth daily. 10/14/19   [provider]  metoprolol succinate (TOPROL-XL) 25 MG 24 hr tablet Take 25 mg by mouth daily. 06/21/20   [provider]  omeprazole (PRILOSEC) 40 MG capsule Take 40 mg by mouth 2 (two) times daily. 05/03/19   [provider]  potassium chloride SA (KLOR-CON) 20 MEQ tablet Take 20 mEq by mouth 2 (two) times daily. 06/23/20   [provider]  traZODone (DESYREL) 100 MG tablet Take 200 mg by mouth at bedtime.  05/03/19   [provider]    Physical Exam: Vitals:   11/29/20 1400 11/29/20 1415 11/29/20 1500 11/29/20 1515  BP: (!) 141/105 (!) 93/57 116/81 (!) 127/93  Pulse: 81 (!) 105 77 80  Resp: (!) 23 (!) 30 (!) 25 20  Temp:      TempSrc:      SpO2: 97% 97% 92% 95%  Weight:      Height:  Constitutional: No acute distress Head: Atraumatic Eyes: Conjunctiva clear ENM: Moist mucous membranes. Normal dentition.  Neck: Supple Respiratory: scattered rhonchi and exp wheeze Cardiovascular: irreg irreg, faint systolic murmur Abdomen: Non-tender, non-distended. No masses. No rebound or guarding. Positive bowel sounds. Musculoskeletal: No joint deformity upper and lower extremities. Normal ROM, no contractures. Normal muscle tone.  Skin: few bruises oon extremities Extremities: No peripheral edema. Palpable peripheral pulses. Neurologic: Alert, moving all 4 extremities. 5/5 lower strength, distal sensation intact Psychiatric: Normal insight and judgement.   Labs on Admission: I have  personally reviewed following labs and imaging studies  CBC: Recent Labs  Lab 11/29/20 1221  WBC 11.1*  HGB 14.3  HCT 40.4  MCV 109.2*  PLT 291   Basic Metabolic Panel: Recent Labs  Lab 11/29/20 1241  NA 141  K 3.8  CL 94*  CO2 31  GLUCOSE 148*  BUN 20  CREATININE 1.36*  CALCIUM 10.2  MG 1.1*   GFR: Estimated Creatinine Clearance: 30.1 mL/min (A) (by C-G formula based on SCr of 1.36 mg/dL (H)). Liver Function Tests: Recent Labs  Lab 11/29/20 1241  AST 47*  ALT 20  ALKPHOS 64  BILITOT 0.8  PROT 7.0  ALBUMIN 3.5   No results for input(s): LIPASE, AMYLASE in the last 168 hours. No results for input(s): AMMONIA in the last 168 hours. Coagulation Profile: No results for input(s): INR, PROTIME in the last 168 hours. Cardiac Enzymes: No results for input(s): CKTOTAL, CKMB, CKMBINDEX, TROPONINI in the last 168 hours. BNP (last 3 results) No results for input(s): PROBNP in the last 8760 hours. HbA1C: No results for input(s): HGBA1C in the last 72 hours. CBG: No results for input(s): GLUCAP in the last 168 hours. Lipid Profile: No results for input(s): CHOL, HDL, LDLCALC, TRIG, CHOLHDL, LDLDIRECT in the last 72 hours. Thyroid Function Tests: No results for input(s): TSH, T4TOTAL, FREET4, T3FREE, THYROIDAB in the last 72 hours. Anemia Panel: No results for input(s): VITAMINB12, FOLATE, FERRITIN, TIBC, IRON, RETICCTPCT in the last 72 hours. Urine analysis:    Component Value Date/Time   COLORURINE AMBER (A) 02/22/2020 1751   APPEARANCEUR TURBID (A) 02/22/2020 1751   APPEARANCEUR Clear 06/12/2014 1029   LABSPEC 1.018 02/22/2020 1751   LABSPEC 1.005 06/12/2014 1029   PHURINE 5.0 02/22/2020 1751   GLUCOSEU NEGATIVE 02/22/2020 1751   GLUCOSEU Negative 06/12/2014 1029   HGBUR NEGATIVE 02/22/2020 1751   BILIRUBINUR NEGATIVE 02/22/2020 1751   BILIRUBINUR Negative 06/12/2014 1029   KETONESUR NEGATIVE 02/22/2020 1751   PROTEINUR 100 (A) 02/22/2020 1751   NITRITE  NEGATIVE 02/22/2020 1751   LEUKOCYTESUR LARGE (A) 02/22/2020 1751   LEUKOCYTESUR Negative 06/12/2014 1029    Radiological Exams on Admission: DG Chest Portable 1 View  Result Date: 11/29/2020 CLINICAL DATA:  Shortness of breath EXAM: PORTABLE CHEST 1 VIEW COMPARISON:  07/29/2019 FINDINGS: Stable cardiomediastinal contours. Streaky opacity in the medial right lung base. Left lung is clear. No pleural effusion or pneumothorax. Numerous healed left-sided rib fractures again noted. IMPRESSION: Streaky opacity in the medial right lung base, atelectasis versus pneumonia. Electronically Signed   By: Duanne Guess D.O.   On: 11/29/2020 12:39    EKG: Independently reviewed. A flutter w/ rvr  Assessment/Plan Active Problems:   Seizure ([facility])   HTN (hypertension)   Alcohol abuse   AKI (acute kidney injury) ([facility])   Atrial flutter with rapid ventricular response ([facility])   # Atrial flutter w/ rvr # tropinemia Likely the primary cause of his symptoms. Infectious w/u  as below. Responded to IV dilt. Normal systolic function on TTE last year at [facility]. Not anticoagulated 2/2 etoh abuse. Followed by [facility] cardiology. Mild trop elevation likely demand from rvr, no chest pain or ischemic changes on ckg - start cardizem oral - holding on anticoagulation - follow trop to nadir  # Generalized weakness # Community acquired pneumonia # Acute cystitis CXR with streaky RLL opacity, rales and wheeze on exam, think prudent to treat for CAP. S/p zosyn in ED. Hemodynamically stable but lactate elevated to 3 (from 2.8 on presentation). New low back pain is somewhat concerning for spinal process though pain is mostly b/l not spinal. Urinalysis suggestive of bacteriuria, had e coli sensitive to ceftriaxone last year. - continue IV fluids - start ceftriaxone/azithromycin for CAP, possible uti - f/u procalcitonin - f/u blood and urine cultures - check ck to eval for rhabdo - f/u MRI lumbar spine - high dose  thiamine IV as at risk for wernicke and gait problems can be presenting symtpom - pt/ot consults - f/u tsh, bnp - f/u covid pcr (rapid antigen neg)  # Hypomagnesemia Mg 1.1 s/p 2 g in ED - trend  # Macrocytosis Likely b12 and/or folate deficiency given etoh, malnutrition - f/u b12, folate - daily mvi  # etoh abuse Denies hx withdrawal, no withdrawal at last hospitalization - monitor on ciwa, banana bag ordered  # AKI Mild, cr 1.3 from baseline 1, likely prenrenal from reduced po - continue fluids, trend  # nicotine abuse - nicotine patch  DVT prophylaxis: heparin Code Status: dnr, confirmed w/ patient  Family Communication: none @ bedside  Consults called: none    Status is: Inpatient  Remains inpatient appropriate because:Inpatient level of care appropriate due to severity of illness   Dispo: The patient is from: Home              Anticipated d/c is to: tbd              Anticipated d/c date is: 3 days              Patient currently is not medically stable to d/c.        Silvano Bilis MD [facility] Pager [phone]  If 7PM-7AM, please contact night-coverage [email or web address] Password [email or web address]  11/29/2020, 4:00 PM

## 2020-11-29 NOTE — ED Provider Notes (Signed)
[facility] Emergency Department Provider Note  ____________________________________________   Event Date/Time   First MD Initiated Contact with Patient 11/29/20 1221     (approximate)  I have reviewed the triage vital signs and the nursing notes.   HISTORY  Chief Complaint Hypotension    HPI Richard Carter is a 80 y.o. male  With h/o HTN, HLD, GERD, here with weakness. Pt reportedly has been drinking fairly heavily over the past week. He has had poor appetite and has not been eating much solid food. He has subsequently gotten progressively weak and has had multiple falls this past week. His caregiver subsequently brought him to Cardiology clinic for evaluation. There, he was noted to be hypotensive and tachycardic, in AFib RVR. He was subsequently sent to the ED for evaluation.  Level 5 caveat invoked as remainder of history, ROS, and physical exam limited due to patient's mild confusion.        Past Medical History:  Diagnosis Date  . Alcohol abuse   . Dementia (HCC)   . GERD (gastroesophageal reflux disease)   . HLD (hyperlipidemia)   . HTN (hypertension)     Patient Active Problem List   Diagnosis Date Noted  . HTN (hypertension) 11/29/2020  . Alcohol abuse 11/29/2020  . AKI (acute kidney injury) (HCC) 11/29/2020  . Atrial flutter with rapid ventricular response (HCC) 11/29/2020  . Seizure (HCC) 02/22/2020  . Pubic ramus fracture (HCC) 07/29/2019  . Acetabular fracture (HCC) 07/29/2019  . HLD (hyperlipidemia) 07/29/2019  . GERD (gastroesophageal reflux disease) 07/29/2019  . UTI (urinary tract infection) 07/29/2019    Past Surgical History:  Procedure Laterality Date  . skin cyst removal      Prior to Admission medications   Medication Sig Start Date End Date Taking? Authorizing Provider  atorvastatin (LIPITOR) 20 MG tablet Take 20 mg by mouth at bedtime. 07/09/19   [provider]  cephALEXin (KEFLEX) 500 MG capsule  Take 1 capsule (500 mg total) by mouth 2 (two) times daily. 02/24/20   Tyrone Nine, MD  donepezil (ARICEPT) 10 MG tablet Take 10 mg by mouth at bedtime. 10/29/19   [provider]  escitalopram (LEXAPRO) 10 MG tablet Take 10 mg by mouth daily. 07/13/19   [provider]  levETIRAcetam (KEPPRA) 500 MG tablet Take 1 tablet (500 mg total) by mouth 2 (two) times daily. 02/24/20   Tyrone Nine, MD  losartan (COZAAR) 100 MG tablet Take 100 mg by mouth daily. 09/07/20   [provider]  magnesium oxide (MAG-OX) 400 MG tablet Take 1 tablet by mouth daily. 10/14/19   [provider]  metoprolol succinate (TOPROL-XL) 25 MG 24 hr tablet Take 25 mg by mouth daily. 06/21/20   [provider]  omeprazole (PRILOSEC) 40 MG capsule Take 40 mg by mouth 2 (two) times daily. 05/03/19   [provider]  potassium chloride SA (KLOR-CON) 20 MEQ tablet Take 20 mEq by mouth 2 (two) times daily. 06/23/20   [provider]  traZODone (DESYREL) 100 MG tablet Take 200 mg by mouth at bedtime.  05/03/19   [provider]    Allergies Patient has no known allergies.  Family History  Problem Relation Age of Onset  . Stroke Mother   . Cancer Brother     Social History Social History   Tobacco Use  . Smoking status: Current Every Day Smoker  . Smokeless tobacco: Never Used  Vaping Use  . Vaping Use: Never  used  Substance Use Topics  . Alcohol use: No    Review of Systems  Review of Systems  Constitutional: Positive for fatigue. Negative for chills and fever.  HENT: Negative for sore throat.   Respiratory: Positive for cough and shortness of breath.   Cardiovascular: Negative for chest pain.  Gastrointestinal: Negative for abdominal pain.  Genitourinary: Negative for flank pain.  Musculoskeletal: Negative for neck pain.  Skin: Negative for rash and wound.  Allergic/Immunologic: Negative for immunocompromised state.  Neurological: Positive for  weakness. Negative for numbness.  Hematological: Does not bruise/bleed easily.  All other systems reviewed and are negative.    ____________________________________________  PHYSICAL EXAM:      VITAL SIGNS: ED Triage Vitals  Enc Vitals Group     BP 11/29/20 1211 90/71     Pulse Rate 11/29/20 1211 (!) 55     Resp 11/29/20 1211 18     Temp 11/29/20 1211 98 F (36.7 C)     Temp Source 11/29/20 1211 Oral     SpO2 11/29/20 1211 97 %     Weight 11/29/20 1212 110 lb (49.9 kg)     Height 11/29/20 1212 5\' 7"  (1.702 m)     Head Circumference --      Peak Flow --      Pain Score 11/29/20 1215 8     Pain Loc --      Pain Edu? --      Excl. in GC? --      Physical Exam Vitals and nursing note reviewed.  Constitutional:      General: He is not in acute distress.    Appearance: He is well-developed.  HENT:     Head: Normocephalic and atraumatic.  Eyes:     Conjunctiva/sclera: Conjunctivae normal.  Cardiovascular:     Rate and Rhythm: Tachycardia present. Rhythm irregularly irregular.     Heart sounds: Normal heart sounds. No murmur heard. No friction rub.  Pulmonary:     Effort: Pulmonary effort is normal. Tachypnea present. No respiratory distress.     Breath sounds: Examination of the right-lower field reveals rales. Rales present. No wheezing.  Abdominal:     General: There is no distension.     Palpations: Abdomen is soft.     Tenderness: There is no abdominal tenderness.  Musculoskeletal:     Cervical back: Neck supple.  Skin:    General: Skin is warm.     Capillary Refill: Capillary refill takes less than 2 seconds.  Neurological:     Mental Status: He is alert and oriented to person, place, and time.     Motor: No abnormal muscle tone.       ____________________________________________   LABS (all labs ordered are listed, but only abnormal results are displayed)  Labs Reviewed  CBC - Abnormal; Notable for the following components:      Result Value   WBC  11.1 (*)    RBC 3.70 (*)    MCV 109.2 (*)    MCH 38.6 (*)    RDW 16.0 (*)    All other components within normal limits  LACTIC ACID, PLASMA - Abnormal; Notable for the following components:   Lactic Acid, Venous 2.8 (*)    All other components within normal limits  LACTIC ACID, PLASMA - Abnormal; Notable for the following components:   Lactic Acid, Venous 3.0 (*)    All other components within normal limits  HEPATIC FUNCTION PANEL - Abnormal; Notable for the following components:  AST 47 (*)    All other components within normal limits  MAGNESIUM - Abnormal; Notable for the following components:   Magnesium 1.1 (*)    All other components within normal limits  BASIC METABOLIC PANEL - Abnormal; Notable for the following components:   Chloride 94 (*)    Glucose, Bld 148 (*)    Creatinine, Ser 1.36 (*)    GFR, Estimated 52 (*)    Anion gap 16 (*)    All other components within normal limits  URINALYSIS, COMPLETE (UACMP) WITH MICROSCOPIC - Abnormal; Notable for the following components:   Color, Urine YELLOW (*)    APPearance HAZY (*)    Protein, ur 30 (*)    Nitrite POSITIVE (*)    Leukocytes,Ua LARGE (*)    Bacteria, UA FEW (*)    All other components within normal limits  BRAIN NATRIURETIC PEPTIDE - Abnormal; Notable for the following components:   B Natriuretic Peptide 205.7 (*)    All other components within normal limits  TROPONIN I (HIGH SENSITIVITY) - Abnormal; Notable for the following components:   Troponin I (High Sensitivity) 70 (*)    All other components within normal limits  TROPONIN I (HIGH SENSITIVITY) - Abnormal; Notable for the following components:   Troponin I (High Sensitivity) 76 (*)    All other components within normal limits  RESP PANEL BY RT-PCR (FLU A&B, COVID) ARPGX2  CULTURE, BLOOD (ROUTINE X 2)  CULTURE, BLOOD (ROUTINE X 2)  URINE CULTURE  CK  PROCALCITONIN  TSH  LACTIC ACID, PLASMA  VITAMIN B12  FOLATE  COMPREHENSIVE METABOLIC PANEL   CBC  PROCALCITONIN  POC SARS CORONAVIRUS 2 AG -  ED  TROPONIN I (HIGH SENSITIVITY)    ____________________________________________  EKG: Atrial flutter with variable AV block, VR 166. QRS 64, QTc 445. No acute ST eelvations or depressions. No ischemic changes. ________________________________________  RADIOLOGY All imaging, including plain films, CT scans, and ultrasounds, independently reviewed by me, and interpretations confirmed via formal radiology reads.  ED MD interpretation:   CXR: R basilar PNA  Official radiology report(s): DG Chest Portable 1 View  Result Date: 11/29/2020 CLINICAL DATA:  Shortness of breath EXAM: PORTABLE CHEST 1 VIEW COMPARISON:  07/29/2019 FINDINGS: Stable cardiomediastinal contours. Streaky opacity in the medial right lung base. Left lung is clear. No pleural effusion or pneumothorax. Numerous healed left-sided rib fractures again noted. IMPRESSION: Streaky opacity in the medial right lung base, atelectasis versus pneumonia. Electronically Signed   By: Davina Poke D.O.   On: 11/29/2020 12:39    ____________________________________________  PROCEDURES   Procedure(s) performed (including Critical Care):  .Critical Care Performed by: Duffy Bruce, MD Authorized by: Duffy Bruce, MD   Critical care provider statement:    Critical care time (minutes):  35   Critical care time was exclusive of:  Separately billable procedures and treating other patients and teaching time   Critical care was necessary to treat or prevent imminent or life-threatening deterioration of the following conditions:  Cardiac failure, circulatory failure, sepsis and respiratory failure   Critical care was time spent personally by me on the following activities:  Development of treatment plan with patient or surrogate, discussions with consultants, evaluation of patient's response to treatment, examination of patient, obtaining history from patient or surrogate, ordering  and performing treatments and interventions, ordering and review of laboratory studies, ordering and review of radiographic studies, pulse oximetry, re-evaluation of patient's condition and review of old charts   I assumed direction of  critical care for this patient from another provider in my specialty: no   .1-3 Lead EKG Interpretation Performed by: Shaune Pollack, MD Authorized by: Shaune Pollack, MD     Interpretation: abnormal     ECG rate:  100-140   ECG rate assessment: tachycardic     Rhythm: atrial fibrillation     Ectopy: none     Conduction: normal   Comments:     Indication: Weakness, AFib     ____________________________________________  INITIAL IMPRESSION / MDM / ASSESSMENT AND PLAN / ED COURSE  As part of my medical decision making, I reviewed the following data within the electronic MEDICAL RECORD NUMBER Nursing notes reviewed and incorporated, Old chart reviewed, Notes from prior ED visits, and [place] Controlled Substance Database       *SAVAN RUTA was evaluated in Emergency Department on 11/29/2020 for the symptoms described in the history of present illness. He was evaluated in the context of the global COVID-19 pandemic, which necessitated consideration that the patient might be at risk for infection with the SARS-CoV-2 virus that causes COVID-19. Institutional protocols and algorithms that pertain to the evaluation of patients at risk for COVID-19 are in a state of rapid change based on information released by regulatory bodies including the CDC and federal and state organizations. These policies and algorithms were followed during the patient's care in the ED.  Some ED evaluations and interventions may be delayed as a result of limited staffing during the pandemic.*     Medical Decision Making:  80 yo M here with generalized weakness, poor PO intake. Suspect basilar PNA 2/2 aspiration, likely related to EtOH use, now complicated by AFib RVR. Pt labs show marked  hypomangesemia, lactic acidosis likely related to his alcohol use, dehydration. Pt started on empiric fluids, ABX, and will admit. BP, HR improved with fluids and dose of dilt IV x 1. No CP or signs of ACS.   ____________________________________________  FINAL CLINICAL IMPRESSION(S) / ED DIAGNOSES  Final diagnoses:  None     MEDICATIONS GIVEN DURING THIS VISIT:  Medications  sodium chloride 0.9 % bolus 1,000 mL (1,000 mLs Intravenous Not Given 11/29/20 1240)  thiamine (B-1) injection 100 mg (100 mg Intravenous Not Given 11/29/20 1710)  levETIRAcetam (KEPPRA) tablet 500 mg (has no administration in time range)  heparin injection 5,000 Units (5,000 Units Subcutaneous Given 11/29/20 1709)  nicotine (NICODERM CQ - dosed in mg/24 hours) patch 21 mg (21 mg Transdermal Patch Applied 11/29/20 1709)  LORazepam (ATIVAN) tablet 1-4 mg (has no administration in time range)    Or  LORazepam (ATIVAN) injection 1-4 mg (has no administration in time range)  folic acid (FOLVITE) tablet 1 mg (1 mg Oral Given 11/29/20 1709)  multivitamin with minerals tablet 1 tablet (1 tablet Oral Given 11/29/20 1708)  diltiazem (CARDIZEM CD) 24 hr capsule 120 mg (120 mg Oral Given 11/29/20 1708)  cefTRIAXone (ROCEPHIN) 1 g in sodium chloride 0.9 % 100 mL IVPB (0 g Intravenous Stopped 11/29/20 1816)  azithromycin (ZITHROMAX) tablet 250 mg (250 mg Oral Given 11/29/20 1710)  sodium chloride 0.9 % bolus 1,000 mL (0 mLs Intravenous Stopped 11/29/20 1448)  diltiazem (CARDIZEM) injection 10 mg (10 mg Intravenous Given 11/29/20 1418)  magnesium sulfate IVPB 2 g 50 mL (0 g Intravenous Stopped 11/29/20 1538)  piperacillin-tazobactam (ZOSYN) IVPB 3.375 g (0 g Intravenous Stopped 11/29/20 1523)  sodium chloride 0.9 % bolus 1,000 mL (0 mLs Intravenous Stopped 11/29/20 1645)  sodium chloride 0.9 % 1,000 mL with  thiamine 100 mg, folic acid 1 mg, multivitamins adult 10 mL infusion ( Intravenous New Bag/Given 11/29/20 1609)  sodium chloride 0.9 % bolus 1,000 mL  (0 mLs Intravenous Stopped 11/29/20 1816)     ED Discharge Orders    None       Note:  This document was prepared using Dragon voice recognition software and may include unintentional dictation errors.   Shaune Pollack, MD 11/29/20 2004

## 2020-11-29 NOTE — ED Notes (Signed)
Date and time results received: 11/29/20 3 :26pm   Test:Lactic Acid  Critical Value: 3.0  Name of Provider Notified: MD. Penne Lash

## 2020-11-29 NOTE — ED Triage Notes (Signed)
Pt was at cardiologist when he got hypotensive and diaphoretic. Pt was sent here. Neighbor with pt that helps pt take meds, etc.

## 2020-11-29 NOTE — ED Notes (Signed)
Date and time results received: 11/29/20 (use smartphrase ".now" to insert current time)  Test: Lactic Acid  Critical Value: 2.8  Name of Provider Notified: MD. Marcello Moores

## 2020-11-29 NOTE — Consult Note (Signed)
CODE SEPSIS - PHARMACY COMMUNICATION  **Broad Spectrum Antibiotics should be administered within 1 hour of Sepsis diagnosis**  Time Code Sepsis Called/Page Received: 1349  Antibiotics Ordered: 1330  Time of 1st antibiotic administration: 1445   Additional action taken by pharmacy: Contacted RN Forbes Cellar and ensure med available. RN was at intake of severe patient requiring attention and confirmed med available  If necessary, Name of Provider/Nurse Contacted:  RN Donnetta Simpers ,PharmD Clinical Pharmacist  11/29/2020  1:51 PM

## 2020-11-29 NOTE — ED Notes (Signed)
Was advised to collect lactic acid before 630 per sepsis protocol

## 2020-11-29 NOTE — ED Notes (Signed)
hospitalist at bedside

## 2020-11-29 NOTE — Sepsis Progress Note (Signed)
Notified bedside nurse of need to draw repeat lactic acid. 

## 2020-11-29 NOTE — ED Notes (Signed)
Patient transported to MRI 

## 2020-11-30 DIAGNOSIS — I4892 Unspecified atrial flutter: Secondary | ICD-10-CM | POA: Diagnosis not present

## 2020-11-30 LAB — COMPREHENSIVE METABOLIC PANEL
ALT: 15 U/L (ref 0–44)
AST: 33 U/L (ref 15–41)
Albumin: 3 g/dL — ABNORMAL LOW (ref 3.5–5.0)
Alkaline Phosphatase: 47 U/L (ref 38–126)
Anion gap: 12 (ref 5–15)
BUN: 16 mg/dL (ref 8–23)
CO2: 22 mmol/L (ref 22–32)
Calcium: 8.4 mg/dL — ABNORMAL LOW (ref 8.9–10.3)
Chloride: 104 mmol/L (ref 98–111)
Creatinine, Ser: 0.85 mg/dL (ref 0.61–1.24)
GFR, Estimated: >60 mL/min (ref >60)
Glucose, Bld: 83 mg/dL (ref 70–99)
Potassium: 2.7 mmol/L — CL (ref 3.5–5.1)
Sodium: 138 mmol/L (ref 135–145)
Total Bilirubin: 0.9 mg/dL (ref 0.3–1.2)
Total Protein: 5.7 g/dL — ABNORMAL LOW (ref 6.5–8.1)

## 2020-11-30 LAB — VITAMIN D 25 HYDROXY (VIT D DEFICIENCY, FRACTURES): Vit D, 25-Hydroxy: 56.37 ng/mL (ref 30–100)

## 2020-11-30 LAB — CBC
HCT: 30.3 % — ABNORMAL LOW (ref 39.0–52.0)
Hemoglobin: 10.9 g/dL — ABNORMAL LOW (ref 13.0–17.0)
MCH: 38.8 pg — ABNORMAL HIGH (ref 26.0–34.0)
MCHC: 36 g/dL (ref 30.0–36.0)
MCV: 107.8 fL — ABNORMAL HIGH (ref 80.0–100.0)
Platelets: 196 10*3/uL (ref 150–400)
RBC: 2.81 MIL/uL — ABNORMAL LOW (ref 4.22–5.81)
RDW: 16.2 % — ABNORMAL HIGH (ref 11.5–15.5)
WBC: 7.5 10*3/uL (ref 4.0–10.5)
nRBC: 0 % (ref 0.0–0.2)

## 2020-11-30 LAB — LIPID PANEL
Cholesterol: 124 mg/dL (ref 0–200)
HDL: 56 mg/dL (ref >40)
LDL Cholesterol: 56 mg/dL (ref 0–99)
Total CHOL/HDL Ratio: 2.2 RATIO
Triglycerides: 62 mg/dL (ref <150)
VLDL: 12 mg/dL (ref 0–40)

## 2020-11-30 LAB — IRON AND TIBC
Iron: 95 ug/dL (ref 45–182)
Saturation Ratios: 23 % (ref 17.9–39.5)
TIBC: 423 ug/dL (ref 250–450)
UIBC: 328 ug/dL

## 2020-11-30 LAB — VITAMIN B12: Vitamin B-12: 333 pg/mL (ref 180–914)

## 2020-11-30 LAB — ETHANOL: Alcohol, Ethyl (B): <10 mg/dL (ref <10)

## 2020-11-30 LAB — MAGNESIUM: Magnesium: 1.5 mg/dL — ABNORMAL LOW (ref 1.7–2.4)

## 2020-11-30 LAB — PHOSPHORUS: Phosphorus: 2.1 mg/dL — ABNORMAL LOW (ref 2.5–4.6)

## 2020-11-30 LAB — PROCALCITONIN: Procalcitonin: <0.1 ng/mL

## 2020-11-30 LAB — FOLATE: Folate: 62 ng/mL (ref >5.9)

## 2020-11-30 MED ORDER — ASPIRIN EC 81 MG PO TBEC
81.0000 mg | DELAYED_RELEASE_TABLET | Freq: Every day | ORAL | Status: DC
Start: 2020-11-30 — End: 2020-12-01
  Administered 2020-11-30 – 2020-12-01 (×2): 81 mg via ORAL
  Filled 2020-11-30 (×2): qty 1

## 2020-11-30 MED ORDER — MELATONIN 3 MG PO TABS
6.0000 mg | ORAL_TABLET | Freq: Every evening | ORAL | Status: DC
  Administered 2020-11-30: 6 mg via ORAL
  Filled 2020-11-30: qty 2

## 2020-11-30 MED ORDER — POTASSIUM CHLORIDE CRYS ER 20 MEQ PO TBCR
40.0000 meq | EXTENDED_RELEASE_TABLET | Freq: Four times a day (QID) | ORAL | Status: AC
  Administered 2020-11-30 (×2): 40 meq via ORAL
  Filled 2020-11-30 (×2): qty 2

## 2020-11-30 MED ORDER — ATORVASTATIN CALCIUM 20 MG PO TABS
20.0000 mg | ORAL_TABLET | Freq: Every day | ORAL | Status: DC
  Administered 2020-11-30 – 2020-12-01 (×2): 20 mg via ORAL
  Filled 2020-11-30 (×2): qty 1

## 2020-11-30 MED ORDER — DONEPEZIL HCL 5 MG PO TABS
10.0000 mg | ORAL_TABLET | Freq: Every day | ORAL | Status: DC
  Administered 2020-11-30: 10 mg via ORAL
  Filled 2020-11-30 (×2): qty 2

## 2020-11-30 MED ORDER — POTASSIUM PHOSPHATES 15 MMOLE/5ML IV SOLN
30.0000 mmol | Freq: Once | INTRAVENOUS | Status: AC
  Administered 2020-11-30: 30 mmol via INTRAVENOUS
  Filled 2020-11-30: qty 10

## 2020-11-30 MED ORDER — ESCITALOPRAM OXALATE 10 MG PO TABS
10.0000 mg | ORAL_TABLET | Freq: Every day | ORAL | Status: DC
  Administered 2020-11-30 – 2020-12-01 (×2): 10 mg via ORAL
  Filled 2020-11-30 (×2): qty 1

## 2020-11-30 MED ORDER — MAGNESIUM SULFATE 2 GM/50ML IV SOLN
2.0000 g | Freq: Once | INTRAVENOUS | Status: AC
  Administered 2020-11-30: 2 g via INTRAVENOUS
  Filled 2020-11-30: qty 50

## 2020-11-30 MED ORDER — PANTOPRAZOLE SODIUM 40 MG PO TBEC
40.0000 mg | DELAYED_RELEASE_TABLET | Freq: Every day | ORAL | Status: DC
  Administered 2020-11-30 – 2020-12-01 (×2): 40 mg via ORAL
  Filled 2020-11-30 (×2): qty 1

## 2020-11-30 MED ORDER — MAGNESIUM OXIDE 400 (241.3 MG) MG PO TABS
400.0000 mg | ORAL_TABLET | Freq: Every day | ORAL | Status: DC
  Administered 2020-11-30 – 2020-12-01 (×2): 400 mg via ORAL
  Filled 2020-11-30 (×2): qty 1

## 2020-11-30 MED ORDER — ENOXAPARIN SODIUM 40 MG/0.4ML ~~LOC~~ SOLN
40.0000 mg | SUBCUTANEOUS | Status: DC
  Administered 2020-11-30: 40 mg via SUBCUTANEOUS
  Filled 2020-11-30: qty 0.4

## 2020-11-30 NOTE — Evaluation (Signed)
Physical Therapy Evaluation Patient Details Name: Richard Carter MRN: [id number] DOB: 1941-05-29 Today's Date: 11/30/2020   History of Present Illness  Patient is a 80 y.o. male with medical history significant for alcohol abuse, medication noncompliance, a fib, hypertension, dementia, tobacco abuse, presenting with malaise and weakness, multiple falls over the last week.  Clinical Impression  PT evaluation completed. Patient with mild confusion during session and impulsive at times with activity. Patient required minimal assistance for steadying with limited distance ambulation due to unsteady and shuffled gait. Patient had one loss of balance while standing to use urinal that required Min A to correct to midline. Patient has generalized weakness and would benefit from continue PT to maximize independence and address functional limitations listed below. Patient reports he will have a caregiver to assist at home. Recommend home with HHPT only if patient has adequate assistance/supervision at home. Otherwise patient would need SNF placement.     Follow Up Recommendations Home health PT;Supervision for mobility/OOB    Equipment Recommendations  None recommended by PT    Recommendations for Other Services       Precautions / Restrictions Precautions Precautions: Fall Restrictions Weight Bearing Restrictions: No      Mobility  Bed Mobility Overal bed mobility: Needs Assistance Bed Mobility: Supine to Sit;Sit to Supine     Supine to sit: Modified independent (Device/Increase time);HOB elevated Sit to supine: Modified independent (Device/Increase time);HOB elevated   General bed mobility comments: extra time required to complete tasks    Transfers Overall transfer level: Needs assistance Equipment used: 1 person hand held assist Transfers: Sit to/from Stand Sit to Stand: Min assist Stand pivot transfers: Min assist       General transfer comment: Min A for safety and  steadying. patient with impulsive behavior at times  Ambulation/Gait Ambulation/Gait assistance: Min assist Gait Distance (Feet): 5 Feet   Gait Pattern/deviations: Step-to pattern;Shuffle Gait velocity: decreased   General Gait Details: patient needs cues for safety due to impulsive behavior. steadying assistance needed as patient with short shuffled steps  Stairs            Wheelchair Mobility    Modified Rankin (Stroke Patients Only)       Balance Overall balance assessment: Needs assistance;History of Falls Sitting-balance support: Feet supported Sitting balance-Leahy Scale: Good     Standing balance support: No upper extremity supported Standing balance-Leahy Scale: Poor Standing balance comment: patient had loss of balance forward that required minimal assistance without UE support                             Pertinent Vitals/Pain Pain Assessment: No/denies pain    Home Living Family/patient expects to be discharged to:: Private residence Living Arrangements: Alone Available Help at Discharge: Neighbor Type of Home: Mobile home Home Access: Stairs to enter Entrance Stairs-Rails:  (patient reports one rail in the middle, can use right hand or left hand) Entrance Stairs-Number of Steps: 3 Home Layout: One level Home Equipment: Walker - 2 wheels;Cane - single point;Shower seat - built in;Bedside commode      Prior Function Level of Independence: Independent   Gait / Transfers Assistance Needed: patient reports he does not use an assistive device for ambulation but has a cane in the car if needed  ADL's / Homemaking Assistance Needed: neighbor assist with IADLs. patient performs ADLs without assistance  Comments: Pt denies fall history, but on his last admission in 2020, was here  with a fall.     Hand Dominance   Dominant Hand: Right    Extremity/Trunk Assessment   Upper Extremity Assessment Upper Extremity Assessment: Generalized  weakness    Lower Extremity Assessment Lower Extremity Assessment: Generalized weakness       Communication   Communication: No difficulties  Cognition Arousal/Alertness: Awake/alert Behavior During Therapy: WFL for tasks assessed/performed Overall Cognitive Status: Difficult to assess                                 General Comments: patient seems confused about recent events (states he has not been out of the bed yet today but has been up with OT earlier). patient able to follow single step commands with increased time. impulisve at times with mobility      General Comments      Exercises Other Exercises Other Exercises: OT facilitates ed re: role of OT in acute setting, importance of OOB activity, and seated core/UB therex that pt can perform outside of therapy time to increase strength/balance for standing ADLs.   Assessment/Plan    PT Assessment Patient needs continued PT services  PT Problem List Decreased strength;Decreased activity tolerance;Decreased balance;Decreased mobility;Decreased safety awareness       PT Treatment Interventions DME instruction;Gait training;Stair training;Functional mobility training;Therapeutic activities;Therapeutic exercise;Cognitive remediation;Neuromuscular re-education;Balance training    PT Goals (Current goals can be found in the Care Plan section)  Acute Rehab PT Goals Patient Stated Goal: to get stronger PT Goal Formulation: With patient Time For Goal Achievement: 12/14/20 Potential to Achieve Goals: Good    Frequency Min 2X/week   Barriers to discharge        Co-evaluation               AM-PAC PT "6 Clicks" Mobility  Outcome Measure Help needed turning from your back to your side while in a flat bed without using bedrails?: A Little Help needed moving from lying on your back to sitting on the side of a flat bed without using bedrails?: A Little Help needed moving to and from a bed to a chair  (including a wheelchair)?: A Little Help needed standing up from a chair using your arms (e.g., wheelchair or bedside chair)?: A Little Help needed to walk in hospital room?: A Little Help needed climbing 3-5 steps with a railing? : A Little 6 Click Score: 18    End of Session   Activity Tolerance: Patient tolerated treatment well Patient left: in bed;with call bell/phone within reach;with bed alarm set   PT Visit Diagnosis: Unsteadiness on feet (R26.81);History of falling (Z91.81);Muscle weakness (generalized) (M62.81)    Time: 1315-1330 PT Time Calculation (min) (ACUTE ONLY): 15 min   Charges:   PT Evaluation $PT Eval Moderate Complexity: 1 Mod PT Treatments $Therapeutic Activity: 8-22 mins        Donna Bernard, PT, MPT   Ina Homes 11/30/2020, 1:41 PM

## 2020-11-30 NOTE — Evaluation (Signed)
Occupational Therapy Evaluation Patient Details Name: Richard Carter MRN: [id number] DOB: October 05, 1941 Today's Date: 11/30/2020    History of Present Illness Pt is an 80 y/o M with PMH: alcohol abuse, medication noncompliance, Afib, HTN, dementia, and tobacco abuse. He presented to [facility] ED w/ malaise and weakness from [facility] where he was sesing his cardiologist. Pt became diphertic and hypotensive and was found to have Aflutter with RVR on EKG.   Clinical Impression   Pt seen for OT evaluation this date in setting acute hospitalization d/t becoming weak/malaised at his cardiology appt. Pt reports being INDEP with self care and HH fxl mobility at baseline. States he uses [facility] for community fxl mobility. States his neighbor, [name], helps with [place] IADLs including transportation and laundry. Pt's neighbor presents halfway through session and confirms that he helps pt around the house. Pt with some generalized weakness and decreased safety awareness on presentation this date. Pt requires MIN A/CGA with ADL transfers and MIN/MOD A with LB bathing, SETUP to MIN A with seated UB bathing/dressing. Pt tolerates well and HR sustained 80s-90s throughout. Pt's BP is slightly low with SBP in the 80s, MAP was in the 70s, but notified pt's RN of low SBP/DBP. Will continue to follow acutely. Anticipate pt will require HHOT and SUPV for 24h upon initial d/c for safety in the home environment.    Follow Up Recommendations  Home health OT;Supervision/Assistance - 24 hour (at least upon initial d/c)    Equipment Recommendations  None recommended by OT (pt and pt's neighbor report he has all needed equipment.)    Recommendations for Other Services       Precautions / Restrictions Precautions Precautions: Fall Restrictions Weight Bearing Restrictions: No      Mobility Bed Mobility Overal bed mobility: Modified Independent                  Transfers Overall transfer level: Needs  assistance Equipment used: 1 person hand held assist Transfers: Sit to/from Stand;Stand Pivot Transfers Sit to Stand: Min assist;Min guard Stand pivot transfers: Min assist       General transfer comment: increased time, flexed hips.    Balance Overall balance assessment: Needs assistance   Sitting balance-Leahy Scale: Good       Standing balance-Leahy Scale: Fair Standing balance comment: benefits from UE support, slight sway, no gross LOB                           ADL either performed or assessed with clinical judgement   ADL                                         General ADL Comments: requires SETUP for self-feeding and grooming d/t difficulty opening containers/pacages/screw tops d/t weak pinch/grip. Pt requires MIN A for LB dressing in sitting, and MIN/MOD A for posterior LB bathing in standing with UE support on sink. Pt requries MIN A with ADL transfsers, anticiapte he could benefit from UE support with RW.     Vision Patient Visual Report: No change from baseline       Perception     Praxis      Pertinent Vitals/Pain Pain Assessment: No/denies pain     Hand Dominance Right   Extremity/Trunk Assessment Upper Extremity Assessment Upper Extremity Assessment: Generalized weakness   Lower Extremity Assessment Lower Extremity  Assessment: Generalized weakness       Communication Communication Communication: No difficulties   Cognition Arousal/Alertness: Awake/alert Behavior During Therapy: WFL for tasks assessed/performed Overall Cognitive Status: Difficult to assess                                 General Comments: Pt oriented to place, month and year, not date/day and not oriented to situation. Pt with some slow processing/responses, but overall appropriate with command following and pleasant.   General Comments       Exercises Other Exercises Other Exercises: OT facilitates ed re: role of OT in acute  setting, importance of OOB activity, and seated core/UB therex that pt can perform outside of therapy time to increase strength/balance for standing ADLs.   Shoulder Instructions      Home Living Family/patient expects to be discharged to:: Private residence Living Arrangements: Alone Available Help at Discharge: Enos Fling (neighbor across the street, [name], helps pt with IADLs) Type of Home: Mobile home Home Access: Stairs to enter [employer] of Steps: 2   Home Layout: One level     Bathroom Shower/Tub: Producer, television/film/video: Handicapped height     Home Equipment: Environmental consultant - 2 wheels;Cane - single point;Shower seat - built in;Bedside commode          Prior Functioning/Environment Level of Independence: Needs assistance  Gait / Transfers Assistance Needed: Pt performs limited in home fxl mobility w/ no AD, requires SPC when out of the home per his report. ADL's / Homemaking Assistance Needed: Pt states he can drive, but states that his neighbor, [name], takes him to appts and gets his groceries. Pt states he is able to do his basic self care, but [name] also helps with [facility] IADLs including cleaning, laundry and mowing the yard   Comments: Pt denies fall history, but on his last admission in 2020, was here with a fall.        OT Problem List: Decreased strength;Impaired balance (sitting and/or standing);Decreased cognition;Decreased range of motion;Decreased safety awareness;Decreased knowledge of use of DME or AE;Cardiopulmonary status limiting activity      OT Treatment/Interventions: Self-care/ADL training;DME and/or AE instruction;Therapeutic activities;Balance training;Therapeutic exercise;Energy conservation;Patient/family education    OT Goals(Current goals can be found in the care plan section) Acute Rehab OT Goals Patient Stated Goal: to get stronger OT Goal Formulation: With patient Time For Goal Achievement: 12/14/20 Potential to Achieve Goals:  Good ADL Goals Pt Will Perform Lower Body Bathing: with supervision;sit to/from stand Pt Will Perform Upper Body Dressing: Independently;sitting Pt Will Perform Lower Body Dressing: with supervision;sit to/from stand Pt Will Transfer to Toilet: with supervision;ambulating;bedside commode (with LRAD to commode ~10' away to increase tolerance for fxl mobility) Pt/caregiver will Perform Home Exercise Program: Increased strength;Both right and left upper extremity;With Supervision  OT Frequency: Min 1X/week   Barriers to D/C:            Co-evaluation              AM-PAC OT "6 Clicks" Daily Activity     Outcome Measure Help from another person eating meals?: None Help from another person taking care of personal grooming?: A Little Help from another person toileting, which includes using toliet, bedpan, or urinal?: A Lot Help from another person bathing (including washing, rinsing, drying)?: A Lot Help from another person to put on and taking off regular upper body clothing?: A Little Help from  another person to put on and taking off regular lower body clothing?: A Little 6 Click Score: 17   End of Session Equipment Utilized During Treatment: Gait belt;Rolling walker Nurse Communication: Mobility status;Other (comment) (notified RN, Shanda Bumps, that pt's BP low with)  Activity Tolerance: Patient tolerated treatment well Patient left: in chair;with call bell/phone within reach;with chair alarm set;with family/visitor present  OT Visit Diagnosis: Unsteadiness on feet (R26.81);Muscle weakness (generalized) (M62.81)                Time: 9244-6286 OT Time Calculation (min): 44 min Charges:  OT General Charges $OT Visit: 1 Visit OT Evaluation $OT Eval Moderate Complexity: 1 Mod OT Treatments $Self Care/Home Management : 23-37 mins $Therapeutic Activity: 8-22 mins  Rejeana Brock, MS, OTR/L ascom [phone] 11/30/20, 1:05 PM

## 2020-11-30 NOTE — Progress Notes (Signed)
Mobility Specialist - Progress Note   11/30/20 1100  Mobility  Activity Ambulated to bathroom  Level of Assistance Moderate assist, patient does 50-74%  Assistive Device None  Distance Ambulated (ft) 25 ft  Mobility Response Tolerated well  Mobility performed by Mobility specialist  $Mobility charge 1 Mobility    Mobility was responding to bed alarm at time of arrival. Pt was standing in front of recliner at time of entry with caretaker present. Pt utilizing room air. Pt was modA ambulating to bathroom, using furniture and CGA for steadiness. Pt would benefit from use of RW, but author unable to locate one in room. Pt unsteady without AD. Pt had incontinent episode with both urinal output and loose BM during ambulation. VC needed for sequencing and hand placement. Pt impulsive and was instructed to wait for assistance to complete transfers. Once safely seated on toilet, pt continued BM and urinal output. Mobility cleaned floors for safe return to bed. Pt was able to perform peri-care on self with minA. Pt was able to doff gown and socks with supervision. Clean gown and socks provided with assist. Overall, pt tolerated session well. Pt was left in bed with all needs in reach and bed alarm set. Nurse notified.    Filiberto Pinks Mobility Specialist 11/30/20, 11:40 AM

## 2020-11-30 NOTE — Progress Notes (Signed)
Initial Nutrition Assessment  DOCUMENTATION CODES:   Not applicable  INTERVENTION:   Ensure Enlive po BID, each supplement provides 350 kcal and 20 grams of protein  MVI, thiamine and folic acid in setting of etoh abuse  Pt at high refeed risk; recommend monitor potassium, magnesium and phosphorus labs daily until stable  NUTRITION DIAGNOSIS:   Inadequate oral intake related to acute illness as evidenced by meal completion < 25%.  GOAL:   Patient will meet greater than or equal to 90% of their needs  MONITOR:   PO intake,Supplement acceptance,Labs,Weight trends,Skin,I & O's  REASON FOR ASSESSMENT:   Malnutrition Screening Tool    ASSESSMENT:   80 y.o. male with medical history significant for alcohol abuse, medication noncompliance, a fib, HTN, GERD, dementia and tobacco abuse who is admitted with CAP  RD working remotely.  Unable to speak with pt on phone as pt with dementia and is a poor historian. Suspect pt with decreased appetite and oral intake at baseline r/t etoh abuse. Pt is documented to be eating 0% of meals in hospital. RD will add supplements to help pt meet his estimated needs. Pt is at high refeed risk. Per chart, pt appears fairly weight stable at baseline. Pt is at high risk for malnutrition. RD will obtain nutrition related exam and history at baseline.   Medications reviewed and include: aspirin, lovenox, folic acid, Mg oxide, melatonin, MVI, nicotine, protonix, KCl, thiamine, ceftriaxone, KPhos    Labs reviewed: K 2.7(L), P 2.1(L), Mg 1.5(L) Hgb 10.9(L), Hct 30.3(L), MCV 107.8(L), MCH 38.8(L)  NUTRITION - FOCUSED PHYSICAL EXAM: Unable to perform at this time   Diet Order:   Diet Order            Diet regular Room service appropriate? Yes; Fluid consistency: Thin  Diet effective now                EDUCATION NEEDS:   Not appropriate for education at this time  Skin:  Skin Assessment: Reviewed RN Assessment (ecchymosis)  Last BM:   1/5  Height:   Ht Readings from Last 1 Encounters:  11/30/20 5\' 7"  (1.702 m)    Weight:   Wt Readings from Last 1 Encounters:  11/30/20 61.4 kg    Ideal Body Weight:  67.2 kg  BMI:  Body mass index is 21.21 kg/m.  Estimated Nutritional Needs:   Kcal:  1700-1900kcal/day  Protein:  80-90g/day  Fluid:  1.6-1.8L/day  01/28/21 MS, RD, LDN Please refer to [facility] for RD and/or RD on-call/weekend/after hours pager

## 2020-11-30 NOTE — Plan of Care (Signed)

## 2020-11-30 NOTE — Progress Notes (Signed)
[facility] Progress Note  Patient: Richard Carter    MPN:[id number]  DOA: 11/29/2020     Date of Service: the patient was seen and examined on 11/30/2020  Chief Complaint  Patient presents with  . Hypotension   Brief hospital course: Richard Carter is a 80 y.o. male with medical history significant for alcohol abuse, medication noncompliance, a fib, htn, dementia, tobacco abuse, presenting with the above.  Reports several days of feeling weak, mainly in legs but also elsewhere. No muscle pain, nothing focal. Does have bilateral low back pain. Denies fever or sick contacts. No nausea or vomiting but has decreased appetite. Denies chest pain or palpitations. No abdominal pain. No dysuria. Is not compliant with his home meds. Drinks a fifth of liquor every 1-2 days, denies history of withdrawal. No recent hospitalization or antibiotics. Feels unsteady on his feet.   ED Course:   Found to be in a flutter w/ rvr, given IV diltiazem. Also given magnesium for low mag, and zosyn for possible cepsis.    Assessment and Plan: Active Problems:   Seizure (HCC)   HTN (hypertension)   Alcohol abuse   AKI (acute kidney injury) (HCC)   Atrial flutter with rapid ventricular response (HCC)    # Atrial flutter w/ rvr # tropinemia Likely the primary cause of his symptoms. Infectious w/u as below. Responded to IV dilt. Normal systolic function on TTE last year at [facility]. Not anticoagulated 2/2 etoh abuse. Followed by [facility] cardiology. Mild trop elevation likely demand from rvr, no chest pain or ischemic changes on ckg - start cardizem oral - holding on anticoagulation - follow trop to nadir Resumed aspirin and statin, LDL 56 at lower end.  Follow-up with PCP and cardiologist as an outpatient.   # Generalized weakness most likely due to hypokalemia, hypophosphatemia and hypomagnesemia due to nutritional deficiency. # Community acquired pneumonia # Acute cystitis CXR with streaky RLL  opacity, rales and wheeze on exam, think prudent to treat for CAP. S/p zosyn in ED. Hemodynamically stable but lactate elevated to 3 (from 2.8 on presentation). New low back pain is somewhat concerning for spinal process though pain is mostly b/l not spinal. Urinalysis suggestive of bacteriuria, had e coli sensitive to ceftriaxone last year. - continue IV fluids - start ceftriaxone/azithromycin for CAP, possible uti - procalcitonin <0.1 - f/u blood and urine cultures, pending - ck 50 wnl  -  MRI lumbar spine shows chronic moderate to severe stenosis, Chronic compression fracture of the L1 superior endplate with loss of approximately 50% of vertebral body height centrally without Retropulsion.  Recommend to follow with PCP as an outpatient. - high dose thiamine IV as at risk for wernicke and gait problems can be presenting symtpom - pt/ot consults -  tsh 0.6, bnp 205  - covid pcr negative (rapid antigen neg)  #Hypokalemia, potassium repleted.  Monitor and replete as needed  #Hypophosphatemia due to nutritional deficiency. Phos repleted.  Monitor and replete as needed.  # Hypomagnesemia Mg 1.1 s/p 2 g in ED, Mg 1.5 repleted  - trend  # Macrocytosis, due to EtOH abuse Normal range, B12 level 333, target >400, started oral supplement - daily mvi  # etoh abuse Denies hx withdrawal, no withdrawal at last hospitalization - monitor on ciwa, banana bag ordered  # AKI, Resolved Cr 0.85 Mild, cr 1.3 from baseline 1, likely prenrenal from reduced po - continue fluids, trend  # nicotine abuse - nicotine patch  #History of dementia, patient is  not a very good historian.  Continue supportive care.  Body mass index is 21.21 kg/m.       Diet: Regular DVT Prophylaxis: Subcutaneous Lovenox   Advance goals of care discussion: DNR  Family Communication: family was not present at bedside, at the time of interview.  The pt provided permission to discuss medical plan with the family.  Opportunity was given to ask question and all questions were answered satisfactorily.   Disposition:  Pt is from Home, admitted with RVR, electrolyte imbalance, possibility of pneumonia and UTI, alcohol abuse, still has electrolyte imbalance, which precludes a safe discharge. Discharge to home vs HHPT PT and OT eval, when electrolyte will be normalized and heart rate will be controlled..  Subjective: Overall patient feels improvement, no any significant overnight events.  Patient presented with generalized weakness, decreased appetite but patient feels hungry and was eating breakfast.  Patient was complaining of decreased sleep. Denied any alcohol withdrawal symptoms. Denied any chest pain or palpitations, no abdominal pain, no nausea vomiting or diarrhea.   Physical Exam: General:  alert oriented to time, place, and person.  Appear in no distress, affect appropriate Eyes: PERRLA ENT: Oral Mucosa Clear, moist  Neck: no JVD,  Cardiovascular: S1 and S2 Present, no Murmur,  Respiratory: good respiratory effort, Bilateral Air entry equal and Decreased, no Crackles, no wheezes Abdomen: Bowel Sound present, Soft and no tenderness,  Skin: no rashes Extremities: no Pedal edema, no calf tenderness Neurologic: without any new focal findings Gait not checked due to patient safety concerns  Vitals:   11/30/20 0500 11/30/20 0630 11/30/20 0631 11/30/20 0749  BP: (!) 159/90  (!) 164/81 (!) 167/90  Pulse: 61  (!) 53 (!) 54  Resp: (!) 23  20 17   Temp:   (!) 97.4 F (36.3 C) 97.6 F (36.4 C)  TempSrc:   Oral Oral  SpO2: 96%  97% 98%  Weight:  61.4 kg    Height:  5\' 7"  (1.702 m)      Intake/Output Summary (Last 24 hours) at 11/30/2020 1125 Last data filed at 11/30/2020 1010 Gross per 24 hour  Intake 1360 ml  Output --  Net 1360 ml   Filed Weights   11/29/20 1212 11/30/20 0630  Weight: 49.9 kg 61.4 kg    Data Reviewed: I have personally reviewed and interpreted daily labs, tele strips,  imagings as discussed above. I reviewed all nursing notes, pharmacy notes, vitals, pertinent old records I have discussed plan of care as described above with RN and patient/family.  CBC: Recent Labs  Lab 11/29/20 1221 11/30/20 0732  WBC 11.1* 7.5  HGB 14.3 10.9*  HCT 40.4 30.3*  MCV 109.2* 107.8*  PLT 291 025   Basic Metabolic Panel: Recent Labs  Lab 11/29/20 1241 11/30/20 0732  NA 141 138  K 3.8 2.7*  CL 94* 104  CO2 31 22  GLUCOSE 148* 83  BUN 20 16  CREATININE 1.36* 0.85  CALCIUM 10.2 8.4*  MG 1.1* 1.5*  PHOS  --  2.1*    Studies: MR LUMBAR SPINE WO CONTRAST  Result Date: 11/29/2020 CLINICAL DATA:  PE low back pain, progressive neurological deficit. EXAM: MRI LUMBAR SPINE WITHOUT CONTRAST TECHNIQUE: Multiplanar, multisequence MR imaging of the lumbar spine was performed. No intravenous contrast was administered. COMPARISON:  MRI of the lumbar spine August 28, 2015. FINDINGS: Segmentation:  Standard. Alignment:  Levoconvex scoliosis of the lumbar spine. Vertebrae: No acute fracture, evidence of discitis, or bone lesion. Chronic compression fracture of the  L1 superior endplate with loss of approximately 50% of vertebral body height centrally without retropulsion. Conus medullaris and cauda equina: Conus extends to the L1 level. Conus and cauda equina appear normal. Paraspinal and other soft tissues: Bilateral renal cysts Disc levels: T12-L1: No spinal canal or neural foraminal stenosis. L1-2: Shallow disc bulge and moderate facet degenerative changes with bilateral joint effusion. No spinal canal or neural foraminal stenosis. L3-4: Disc bulge, facet degenerative changes with bilateral joint effusion and mild ligamentum flavum redundancy which in association with prominence of the posterior epidural fat resulting in moderate narrowing of the thecal sac, moderate right and mild left neural foraminal narrowing. L3-4: Loss of disc height, disc bulge, facet degenerative changes and  ligamentum flavum redundancy which in association with prominence of the posterior epidural fat results in severe narrowing of the thecal sac, severe right and mild left neural foraminal narrowing. L4-5: Loss of disc height, disc bulge, facet degenerative changes and ligamentum flavum redundancy resulting in moderate spinal canal stenosis, moderate right and severe left neural foraminal narrowing. L5-S1: Shallow disc bulge and mild facet degenerative changes without significant spinal neural foraminal stenosis. The degree of spinal canal stenosis at L2-3, L3-4 and L4-5 has significantly progressed since prior MRI. IMPRESSION: 1. Multilevel degenerative changes of the lumbar spine as described above, worst at L3-4 where there is severe narrowing of the thecal sac and severe right neural foraminal narrowing. 2. Moderate spinal canal stenosis with moderate right and severe left neural foraminal narrowing at L4-5. 3. Chronic compression fracture of the L1 superior endplate with loss of approximately 50% of vertebral body height centrally without retropulsion. Electronically Signed   By: Baldemar Lenis M.D.   On: 11/29/2020 20:19   DG Chest Portable 1 View  Result Date: 11/29/2020 CLINICAL DATA:  Shortness of breath EXAM: PORTABLE CHEST 1 VIEW COMPARISON:  07/29/2019 FINDINGS: Stable cardiomediastinal contours. Streaky opacity in the medial right lung base. Left lung is clear. No pleural effusion or pneumothorax. Numerous healed left-sided rib fractures again noted. IMPRESSION: Streaky opacity in the medial right lung base, atelectasis versus pneumonia. Electronically Signed   By: Duanne Guess D.O.   On: 11/29/2020 12:39    Scheduled Meds: . aspirin EC  81 mg Oral Daily  . atorvastatin  20 mg Oral Daily  . azithromycin  250 mg Oral Daily  . diltiazem  120 mg Oral Daily  . donepezil  10 mg Oral QHS  . escitalopram  10 mg Oral Daily  . folic acid  1 mg Oral Daily  . heparin  5,000 Units  Subcutaneous Q8H  . levETIRAcetam  500 mg Oral BID  . magnesium oxide  400 mg Oral Daily  . multivitamin with minerals  1 tablet Oral Daily  . nicotine  21 mg Transdermal Daily  . pantoprazole  40 mg Oral Daily  . potassium chloride  40 mEq Oral Q6H  . thiamine injection  100 mg Intravenous TID   Continuous Infusions: . cefTRIAXone (ROCEPHIN)  IV Stopped (11/29/20 1816)  . magnesium sulfate bolus IVPB    . potassium PHOSPHATE IVPB (in mmol)    . sodium chloride     PRN Meds: LORazepam **OR** LORazepam  Time spent: 35 minutes  Author: Gillis Santa. MD [facility] 11/30/2020 11:25 AM  To reach On-call, see care teams to locate the attending and reach out to them via [email or web address]. If 7PM-7AM, please contact night-coverage If you still have difficulty reaching the attending provider, please page the [facility] (Director  on Call) for [facility] on amion for assistance.

## 2020-11-30 NOTE — Progress Notes (Signed)
MD made aware of low K+ -2.7

## 2020-12-01 DIAGNOSIS — I4892 Unspecified atrial flutter: Secondary | ICD-10-CM | POA: Diagnosis not present

## 2020-12-01 LAB — BASIC METABOLIC PANEL
Anion gap: 10 (ref 5–15)
BUN: 14 mg/dL (ref 8–23)
CO2: 24 mmol/L (ref 22–32)
Calcium: 8.6 mg/dL — ABNORMAL LOW (ref 8.9–10.3)
Chloride: 103 mmol/L (ref 98–111)
Creatinine, Ser: 0.85 mg/dL (ref 0.61–1.24)
GFR, Estimated: >60 mL/min (ref >60)
Glucose, Bld: 85 mg/dL (ref 70–99)
Potassium: 3.6 mmol/L (ref 3.5–5.1)
Sodium: 137 mmol/L (ref 135–145)

## 2020-12-01 LAB — CBC
HCT: 33.1 % — ABNORMAL LOW (ref 39.0–52.0)
Hemoglobin: 11.7 g/dL — ABNORMAL LOW (ref 13.0–17.0)
MCH: 38.9 pg — ABNORMAL HIGH (ref 26.0–34.0)
MCHC: 35.3 g/dL (ref 30.0–36.0)
MCV: 110 fL — ABNORMAL HIGH (ref 80.0–100.0)
Platelets: 201 10*3/uL (ref 150–400)
RBC: 3.01 MIL/uL — ABNORMAL LOW (ref 4.22–5.81)
RDW: 16 % — ABNORMAL HIGH (ref 11.5–15.5)
WBC: 7.8 10*3/uL (ref 4.0–10.5)
nRBC: 0 % (ref 0.0–0.2)

## 2020-12-01 LAB — MAGNESIUM: Magnesium: 1.7 mg/dL (ref 1.7–2.4)

## 2020-12-01 LAB — PROCALCITONIN: Procalcitonin: <0.1 ng/mL

## 2020-12-01 LAB — PHOSPHORUS: Phosphorus: 2.2 mg/dL — ABNORMAL LOW (ref 2.5–4.6)

## 2020-12-01 MED ORDER — K PHOS MONO-SOD PHOS DI & MONO 155-852-130 MG PO TABS
500.0000 mg | ORAL_TABLET | Freq: Once | ORAL | Status: AC
  Administered 2020-12-01: 500 mg via ORAL
  Filled 2020-12-01: qty 2

## 2020-12-01 MED ORDER — METOPROLOL SUCCINATE ER 50 MG PO TB24: 50.0000 mg | ORAL_TABLET | Freq: Every day | ORAL | 3 refills | Status: DC

## 2020-12-01 MED ORDER — MAGNESIUM SULFATE 2 GM/50ML IV SOLN
2.0000 g | Freq: Once | INTRAVENOUS | Status: AC
  Administered 2020-12-01: 2 g via INTRAVENOUS
  Filled 2020-12-01: qty 50

## 2020-12-01 MED ORDER — LEVETIRACETAM 500 MG PO TABS: 500.0000 mg | ORAL_TABLET | Freq: Two times a day (BID) | ORAL | 3 refills | Status: DC

## 2020-12-01 MED ORDER — AMOXICILLIN-POT CLAVULANATE 875-125 MG PO TABS: 1.0000 | ORAL_TABLET | Freq: Two times a day (BID) | ORAL | 0 refills | Status: AC

## 2020-12-01 NOTE — Discharge Summary (Signed)
Physician Discharge Summary  Richard Carter VPX:[id number] DOB: 1941/10/30 DOA: 11/29/2020  PCP: Marisue Ivan, MD  Admit date: 11/29/2020 Discharge date: 12/01/2020  Admitted From: Home  Disposition:  Home   Recommendations for Outpatient Follow-up:  1. Follow up with PCP Dr. Burnadette Pop in 1 week 2. Dr. Burnadette Pop: Please assess HR and BP on new metoprolol dose and off losartan 3. Dr. Burnadette Pop: Please check K      Home Health: None  Equipment/Devices: None new  Discharge Condition: Guarded  CODE STATUS: FULL Diet recommendation: Regular  Brief/Interim Summary: Richard Carter is an 80 y.o. M with pAF no longer on AC due to noncompliance and alcohol use and HTN who presented with weakness, exertional intolerance.  In the ER, found to have Afib with RVR, potassium <3 mmol/L.         PRINCIPAL HOSPITAL DIAGNOSIS: Atrial fibrillation with rapid ventricular rate    Discharge Diagnoses:   Atrial fibrillation with rapid ventricular rate Permanent atrial fibrillation Hypokalemia Hypomagnesemia Patient admitted and started on IV diltiazem, electrolyte repletion.     Alcohol use disorder Patient evidently provides conflicting reports of alcohol use.  Likely pneumonia Streaky opacity on CXR.  Suspect community acquired pneumonia.  Treated here with ceftriaxone, discharged on Augmentin to complete 5 day course.  Seizure disorder In context of questionable alcohol use.  Evidently is noncompliant with Keppra. -Resume Keppra  Hypertension Blood pressure here low.  Discharged on new metoprolol dose, higher.  Held ARB for now.        Discharge Instructions  Discharge Instructions    Discharge instructions   Complete by: As directed    From Dr. Maryfrances Bunnell: You were admitted for weakness -- > we found this was from A fib and low potassium You may also have a pneumonia.  For the low potassium, we replaced your potassium You should stop drinking and eat a  balanced diet Go see Dr. Burnadette Pop in 1 week and have labs checked to check your potassium  The fast Afib was because of hte low potassium Otherwise, continue your normal heart medicine metoprolol BUT INCREASE DOSE TO 50 Finish your pills at home by taking metoprolol 50 mg (two tabs) daily, then get the new script for 50 mg tabs and take 1 daily   For the pneumonia Take antibiotics with Augmentin 875-125 twice daily for 5 days, starting tonight  STOP your losartan and follow up with Dr. Burnadette Pop to see if you should restart   Also, you should still be taking a seizure medicine, restart Keppra 500 mg twice daily   Increase activity slowly   Complete by: As directed      Allergies as of 12/01/2020   No Known Allergies     Medication List    STOP taking these medications   losartan 100 MG tablet Commonly known as: COZAAR     TAKE these medications   amoxicillin-clavulanate 875-125 MG tablet Commonly known as: Augmentin Take 1 tablet by mouth 2 (two) times daily for 5 days.   aspirin EC 81 MG tablet Take 81 mg by mouth daily. Swallow whole.   atorvastatin 20 MG tablet Commonly known as: LIPITOR Take 20 mg by mouth daily.   donepezil 10 MG tablet Commonly known as: ARICEPT Take 10 mg by mouth at bedtime.   escitalopram 10 MG tablet Commonly known as: LEXAPRO Take 10 mg by mouth daily.   levETIRAcetam 500 MG tablet Commonly known as: KEPPRA Take 1 tablet (500 mg total) by mouth 2 (two)  times daily.   magnesium oxide 400 MG tablet Commonly known as: MAG-OX Take 400 mg by mouth daily.   metoprolol succinate 50 MG 24 hr tablet Commonly known as: TOPROL-XL Take 1 tablet (50 mg total) by mouth daily. What changed:   medication strength  how much to take   omeprazole 40 MG capsule Commonly known as: PRILOSEC Take 40 mg by mouth daily.   potassium chloride SA 20 MEQ tablet Commonly known as: KLOR-CON Take 20 mEq by mouth 2 (two) times daily.   traZODone  100 MG tablet Commonly known as: DESYREL Take 200 mg by mouth at bedtime.       Follow-up Information    Marisue Ivan, MD. Schedule an appointment as soon as possible for a visit in 1 week.   Specialty: Family Medicine Why: Patient will need to make a follow up appointment. Contact information: [address] [place] [address] [phone]              No Known Allergies     Procedures/Studies: MR LUMBAR SPINE WO CONTRAST  Result Date: 11/29/2020 CLINICAL DATA:  PE low back pain, progressive neurological deficit. EXAM: MRI LUMBAR SPINE WITHOUT CONTRAST TECHNIQUE: Multiplanar, multisequence MR imaging of the lumbar spine was performed. No intravenous contrast was administered. COMPARISON:  MRI of the lumbar spine August 28, 2015. FINDINGS: Segmentation:  Standard. Alignment:  Levoconvex scoliosis of the lumbar spine. Vertebrae: No acute fracture, evidence of discitis, or bone lesion. Chronic compression fracture of the L1 superior endplate with loss of approximately 50% of vertebral body height centrally without retropulsion. Conus medullaris and cauda equina: Conus extends to the L1 level. Conus and cauda equina appear normal. Paraspinal and other soft tissues: Bilateral renal cysts Disc levels: T12-L1: No spinal canal or neural foraminal stenosis. L1-2: Shallow disc bulge and moderate facet degenerative changes with bilateral joint effusion. No spinal canal or neural foraminal stenosis. L3-4: Disc bulge, facet degenerative changes with bilateral joint effusion and mild ligamentum flavum redundancy which in association with prominence of the posterior epidural fat resulting in moderate narrowing of the thecal sac, moderate right and mild left neural foraminal narrowing. L3-4: Loss of disc height, disc bulge, facet degenerative changes and ligamentum flavum redundancy which in association with prominence of the posterior epidural fat results in severe narrowing of the  thecal sac, severe right and mild left neural foraminal narrowing. L4-5: Loss of disc height, disc bulge, facet degenerative changes and ligamentum flavum redundancy resulting in moderate spinal canal stenosis, moderate right and severe left neural foraminal narrowing. L5-S1: Shallow disc bulge and mild facet degenerative changes without significant spinal neural foraminal stenosis. The degree of spinal canal stenosis at L2-3, L3-4 and L4-5 has significantly progressed since prior MRI. IMPRESSION: 1. Multilevel degenerative changes of the lumbar spine as described above, worst at L3-4 where there is severe narrowing of the thecal sac and severe right neural foraminal narrowing. 2. Moderate spinal canal stenosis with moderate right and severe left neural foraminal narrowing at L4-5. 3. Chronic compression fracture of the L1 superior endplate with loss of approximately 50% of vertebral body height centrally without retropulsion. Electronically Signed   By: Baldemar Lenis M.D.   On: 11/29/2020 20:19   DG Chest Portable 1 View  Result Date: 11/29/2020 CLINICAL DATA:  Shortness of breath EXAM: PORTABLE CHEST 1 VIEW COMPARISON:  07/29/2019 FINDINGS: Stable cardiomediastinal contours. Streaky opacity in the medial right lung base. Left lung is clear. No pleural effusion or pneumothorax. Numerous  healed left-sided rib fractures again noted. IMPRESSION: Streaky opacity in the medial right lung base, atelectasis versus pneumonia. Electronically Signed   By: Duanne GuessNicholas  Plundo D.O.   On: 11/29/2020 12:39      Subjective: Sedated from Ativan this morning as part of CIWA protocol, but asking to go home.  No fever, no hypoxia, no respiratory distress, no pain complaints.       Discharge Exam: Vitals:   12/01/20 0740 12/01/20 1212  BP: (!) 181/77 (!) 178/87  Pulse: (!) 52 (!) 52  Resp: 16 16  Temp: (!) 97.5 F (36.4 C) 98.6 F (37 C)  SpO2: 96% 100%   Vitals:   12/01/20 0246 12/01/20 0502  12/01/20 0740 12/01/20 1212  BP:  (!) 178/90 (!) 181/77 (!) 178/87  Pulse:  (!) 58 (!) 52 (!) 52  Resp:  18 16 16   Temp:  98.1 F (36.7 C) (!) 97.5 F (36.4 C) 98.6 F (37 C)  TempSrc:   Oral   SpO2:  99% 96% 100%  Weight: 62.1 kg     Height:        General: Pt is sleepy, awake, not in acute distress, interactive and appropriate Cardiovascular: RRR, nl S1-S2, no murmurs appreciated.   No LE edema.   Respiratory: Normal respiratory rate and rhythm.  CTAB without rales or wheezes. Abdominal: Abdomen soft and non-tender.  No distension or HSM.   Neuro/Psych: Upper arm weakness bilaterally.  Judgment and insight appear mildly impaired. Oriented to self, place, situation, and month.   The results of significant diagnostics from this hospitalization (including imaging, microbiology, ancillary and laboratory) are listed below for reference.     Microbiology: Recent Results (from the past 240 hour(s))  Blood culture (routine x 2)     Status: None (Preliminary result)   Collection Time: 11/29/20  1:18 PM   Specimen: BLOOD  Result Value Ref Range Status   Specimen Description BLOOD BLOOD RIGHT HAND  Final   Special Requests   Final    BOTTLES DRAWN AEROBIC AND ANAEROBIC Blood Culture results may not be optimal due to an inadequate volume of blood received in culture bottles   Culture   Final    NO GROWTH 2 DAYS Performed at [facility], [address] [place], [place] [address]    Report Status PENDING  Incomplete  Blood culture (routine x 2)     Status: None (Preliminary result)   Collection Time: 11/29/20  1:23 PM   Specimen: BLOOD  Result Value Ref Range Status   Specimen Description BLOOD RIGHT ANTECUBITAL  Final   Special Requests   Final    BOTTLES DRAWN AEROBIC AND ANAEROBIC Blood Culture adequate volume   Culture   Final    NO GROWTH 2 DAYS Performed at [facility], [address] [place], [place] [address]    Report Status PENDING  Incomplete   Resp Panel by RT-PCR (Flu A&B, Covid) Urine, Clean Catch     Status: None   Collection Time: 11/29/20  3:39 PM   Specimen: Urine, Clean Catch; Nasopharyngeal(NP) swabs in vial transport medium  Result Value Ref Range Status   SARS Coronavirus 2 by RT PCR NEGATIVE NEGATIVE Final    Comment: (NOTE) SARS-CoV-2 target nucleic acids are NOT DETECTED.  The SARS-CoV-2 RNA is generally detectable in upper respiratory specimens during the acute phase of infection. The lowest concentration of SARS-CoV-2 viral copies this assay can detect is 138 copies/mL. A negative result does not preclude SARS-Cov-2  infection and should not be used as the sole basis for treatment or other patient management decisions. A negative result may occur with  improper specimen collection/handling, submission of specimen other than nasopharyngeal swab, presence of viral mutation(s) within the areas targeted by this assay, and inadequate number of viral copies(<138 copies/mL). A negative result must be combined with clinical observations, patient history, and epidemiological information. The expected result is Negative.  Fact Sheet for Patients:  BloggerCourse.com  Fact Sheet for Healthcare Providers:  SeriousBroker.it  This test is no t yet approved or cleared by the [place] FDA and  has been authorized for detection and/or diagnosis of SARS-CoV-2 by FDA under an Emergency Use Authorization (EUA). This EUA will remain  in effect (meaning this test can be used) for the duration of the COVID-19 declaration under Section 564(b)(1) of the Act, 21 U.S.C.section 360bbb-3(b)(1), unless the authorization is terminated  or revoked sooner.       Influenza A by PCR NEGATIVE NEGATIVE Final   Influenza B by PCR NEGATIVE NEGATIVE Final    Comment: (NOTE) The Xpert Xpress SARS-CoV-2/FLU/RSV plus assay is intended as an aid in the diagnosis of influenza from  Nasopharyngeal swab specimens and should not be used as a sole basis for treatment. Nasal washings and aspirates are unacceptable for Xpert Xpress SARS-CoV-2/FLU/RSV testing.  Fact Sheet for Patients: BloggerCourse.com  Fact Sheet for Healthcare Providers: SeriousBroker.it  This test is not yet approved or cleared by the [place] FDA and has been authorized for detection and/or diagnosis of SARS-CoV-2 by FDA under an Emergency Use Authorization (EUA). This EUA will remain in effect (meaning this test can be used) for the duration of the COVID-19 declaration under Section 564(b)(1) of the Act, 21 U.S.C. section 360bbb-3(b)(1), unless the authorization is terminated or revoked.  Performed at [facility], [address] [place], [place] [address]   Urine Culture     Status: Abnormal (Preliminary result)   Collection Time: 11/29/20  3:39 PM   Specimen: Urine, Random  Result Value Ref Range Status   Specimen Description   Final    URINE, RANDOM Performed at [facility], [address], [place], [place] [address]    Special Requests   Final    NONE Performed at [facility], [address] [place], [place] [address]    Culture (A)  Final    60,000 COLONIES/mL ESCHERICHIA COLI SUSCEPTIBILITIES TO FOLLOW Performed at [facility], [address], [place], [place] [address]    Report Status PENDING  Incomplete     Labs: BNP (last 3 results) Recent Labs    11/29/20 1221  BNP 205.7*   Basic Metabolic Panel: Recent Labs  Lab 11/29/20 1241 11/30/20 0732 12/01/20 0547  NA 141 138 137  K 3.8 2.7* 3.6  CL 94* 104 103  CO2 31 22 24   GLUCOSE 148* 83 85  BUN 20 16 14   CREATININE 1.36* 0.85 0.85  CALCIUM 10.2 8.4* 8.6*  MG 1.1* 1.5* 1.7  PHOS  --  2.1* 2.2*   Liver Function Tests: Recent Labs  Lab 11/29/20 1241 11/30/20 0732  AST 47* 33  ALT 20 15  ALKPHOS 64 47  BILITOT  0.8 0.9  PROT 7.0 5.7*  ALBUMIN 3.5 3.0*   No results for input(s): LIPASE, AMYLASE in the last 168 hours. No results for input(s): AMMONIA in the last 168 hours. CBC: Recent Labs  Lab 11/29/20 1221 11/30/20 0732 12/01/20 0547  WBC 11.1*  7.5 7.8  HGB 14.3 10.9* 11.7*  HCT 40.4 30.3* 33.1*  MCV 109.2* 107.8* 110.0*  PLT 291 196 201   Cardiac Enzymes: Recent Labs  Lab 11/29/20 1425  CKTOTAL 50   BNP: Invalid input(s): POCBNP CBG: No results for input(s): GLUCAP in the last 168 hours. D-Dimer No results for input(s): DDIMER in the last 72 hours. Hgb A1c No results for input(s): HGBA1C in the last 72 hours. Lipid Profile Recent Labs    11/30/20 0732  CHOL 124  HDL 56  LDLCALC 56  TRIG 62  CHOLHDL 2.2   Thyroid function studies Recent Labs    11/29/20 1425  TSH 0.621   Anemia work up Recent Labs    11/30/20 0732 11/30/20 1140  VITAMINB12 333  --   FOLATE 62.0  --   TIBC  --  423  IRON  --  95   Urinalysis    Component Value Date/Time   COLORURINE YELLOW (A) 11/29/2020 1539   APPEARANCEUR HAZY (A) 11/29/2020 1539   APPEARANCEUR Clear 06/12/2014 1029   LABSPEC 1.015 11/29/2020 1539   LABSPEC 1.005 06/12/2014 1029   PHURINE 7.0 11/29/2020 1539   GLUCOSEU NEGATIVE 11/29/2020 1539   GLUCOSEU Negative 06/12/2014 1029   HGBUR NEGATIVE 11/29/2020 1539   BILIRUBINUR NEGATIVE 11/29/2020 1539   BILIRUBINUR Negative 06/12/2014 1029   KETONESUR NEGATIVE 11/29/2020 1539   PROTEINUR 30 (A) 11/29/2020 1539   NITRITE POSITIVE (A) 11/29/2020 1539   LEUKOCYTESUR LARGE (A) 11/29/2020 1539   LEUKOCYTESUR Negative 06/12/2014 1029   Sepsis Labs Invalid input(s): PROCALCITONIN,  WBC,  LACTICIDVEN Microbiology Recent Results (from the past 240 hour(s))  Blood culture (routine x 2)     Status: None (Preliminary result)   Collection Time: 11/29/20  1:18 PM   Specimen: BLOOD  Result Value Ref Range Status   Specimen Description BLOOD BLOOD RIGHT HAND  Final    Special Requests   Final    BOTTLES DRAWN AEROBIC AND ANAEROBIC Blood Culture results may not be optimal due to an inadequate volume of blood received in culture bottles   Culture   Final    NO GROWTH 2 DAYS Performed at [facility], [address] [place], [place] [address]    Report Status PENDING  Incomplete  Blood culture (routine x 2)     Status: None (Preliminary result)   Collection Time: 11/29/20  1:23 PM   Specimen: BLOOD  Result Value Ref Range Status   Specimen Description BLOOD RIGHT ANTECUBITAL  Final   Special Requests   Final    BOTTLES DRAWN AEROBIC AND ANAEROBIC Blood Culture adequate volume   Culture   Final    NO GROWTH 2 DAYS Performed at [facility], [address], [place], [place] [address]    Report Status PENDING  Incomplete  Resp Panel by RT-PCR (Flu A&B, Covid) Urine, Clean Catch     Status: None   Collection Time: 11/29/20  3:39 PM   Specimen: Urine, Clean Catch; Nasopharyngeal(NP) swabs in vial transport medium  Result Value Ref Range Status   SARS Coronavirus 2 by RT PCR NEGATIVE NEGATIVE Final    Comment: (NOTE) SARS-CoV-2 target nucleic acids are NOT DETECTED.  The SARS-CoV-2 RNA is generally detectable in upper respiratory specimens during the acute phase of infection. The lowest concentration of SARS-CoV-2 viral copies this assay can detect is 138 copies/mL. A negative result does not preclude SARS-Cov-2 infection and should not be used as the sole basis for treatment or  other patient management decisions. A negative result may occur with  improper specimen collection/handling, submission of specimen other than nasopharyngeal swab, presence of viral mutation(s) within the areas targeted by this assay, and inadequate number of viral copies(<138 copies/mL). A negative result must be combined with clinical observations, patient history, and epidemiological information. The expected result is Negative.  Fact Sheet for  Patients:  EntrepreneurPulse.com.au  Fact Sheet for Healthcare Providers:  IncredibleEmployment.be  This test is no t yet approved or cleared by the [place] FDA and  has been authorized for detection and/or diagnosis of SARS-CoV-2 by FDA under an Emergency Use Authorization (EUA). This EUA will remain  in effect (meaning this test can be used) for the duration of the COVID-19 declaration under Section 564(b)(1) of the Act, 21 U.S.C.section 360bbb-3(b)(1), unless the authorization is terminated  or revoked sooner.       Influenza A by PCR NEGATIVE NEGATIVE Final   Influenza B by PCR NEGATIVE NEGATIVE Final    Comment: (NOTE) The Xpert Xpress SARS-CoV-2/FLU/RSV plus assay is intended as an aid in the diagnosis of influenza from Nasopharyngeal swab specimens and should not be used as a sole basis for treatment. Nasal washings and aspirates are unacceptable for Xpert Xpress SARS-CoV-2/FLU/RSV testing.  Fact Sheet for Patients: EntrepreneurPulse.com.au  Fact Sheet for Healthcare Providers: IncredibleEmployment.be  This test is not yet approved or cleared by the [place] FDA and has been authorized for detection and/or diagnosis of SARS-CoV-2 by FDA under an Emergency Use Authorization (EUA). This EUA will remain in effect (meaning this test can be used) for the duration of the COVID-19 declaration under Section 564(b)(1) of the Act, 21 U.S.C. section 360bbb-3(b)(1), unless the authorization is terminated or revoked.  Performed at [facility], [address] [place], [place] [address]   Urine Culture     Status: Abnormal (Preliminary result)   Collection Time: 11/29/20  3:39 PM   Specimen: Urine, Random  Result Value Ref Range Status   Specimen Description   Final    URINE, RANDOM Performed at [facility], [address], [place], [place] [address]    Special Requests    Final    NONE Performed at [facility], [address] [place], [place] [address]    Culture (A)  Final    60,000 COLONIES/mL ESCHERICHIA COLI SUSCEPTIBILITIES TO FOLLOW Performed at [facility], [place] [address] [place], [place] [address]    Report Status PENDING  Incomplete     Time coordinating discharge: 35 minutes        SIGNED:   Edwin Dada, MD  [facility] 12/01/2020, 10:38 PM

## 2020-12-01 NOTE — Plan of Care (Signed)

## 2020-12-01 NOTE — Care Management Important Message (Signed)
Important Message  Patient Details  Name: Richard Carter MRN: [id number] Date of Birth: Aug 22, 1941   Medicare Important Message Given:  Yes     Johnell Comings 12/01/2020, 1:23 PM

## 2020-12-01 NOTE — Progress Notes (Signed)
Patient discharged at time via wheelchair accompanied by family caregiver/neighbor to home with all belongings. Caregiver verbalized understanding of discharge instructions and further plan of care.PIV removed by Clinical research associate. NAD noted upon departure.

## 2020-12-01 NOTE — TOC Initial Note (Signed)
Transition of Care [facility]) - Initial/Assessment Note    Patient Details  Name: Richard Carter MRN: [id number] Date of Birth: 1941-11-04  Transition of Care [facility]) CM/SW Contact:    Maree Krabbe, LCSW Phone Number: 12/01/2020, 10:28 AM  Clinical Narrative:   Pt is only alert to self and place. CSW spoke with pt's son Dorinda Hill. He states pt lives alone however, gets a lot of help from neighbor.  Pt's son agreeable to home health. Pt's son did not have a preference. [facility] can service pt. Pt's neighbor will pick pt up at d/c. Pt has no equipment needs.              Expected Discharge Plan: Home w Home Health Services Barriers to Discharge: Continued Medical Work up   Patient Goals and CMS Choice Patient states their goals for this hospitalization and ongoing recovery are:: to get better   Choice offered to / list presented to : Adult Children  Expected Discharge Plan and Services Expected Discharge Plan: Home w Home Health Services In-house Referral: Clinical Social Work   Post Acute Care Choice: Home Health Living arrangements for the past 2 months: Single Family Home Expected Discharge Date: 12/01/20                         HH Arranged: PT,OT HH Agency: [facility] (formerly [facility]) Date HH Agency Contacted: 12/01/20 Time HH Agency Contacted: 1026 Representative spoke with at [facility] Agency: teresa  Prior Living Arrangements/Services Living arrangements for the past 2 months: Single Family Home Lives with:: Self Patient language and need for interpreter reviewed:: Yes Do you feel safe going back to the place where you live?: Yes      Need for Family Participation in Patient Care: Yes (Comment) Care giver support system in place?: Yes (comment)   Criminal Activity/Legal Involvement Pertinent to Current Situation/Hospitalization: No - Comment as needed  Activities of Daily Living Home Assistive Devices/Equipment: Cane (specify quad or straight) ADL Screening  (condition at time of admission) Patient's cognitive ability adequate to safely complete daily activities?: Yes Is the patient deaf or have difficulty hearing?: No Does the patient have difficulty seeing, even when wearing glasses/contacts?: No Does the patient have difficulty concentrating, remembering, or making decisions?: Yes Patient able to express need for assistance with ADLs?: Yes Does the patient have difficulty dressing or bathing?: No Independently performs ADLs?: Yes (appropriate for developmental age) Does the patient have difficulty walking or climbing stairs?: No Weakness of Legs: Both Weakness of Arms/Hands: None  Permission Sought/Granted Permission sought to share information with : Family Supports    Share Information with NAME: donald     Permission granted to share info w Relationship: son     Emotional Assessment Appearance:: Appears stated age Attitude/Demeanor/Rapport: Unable to Assess Affect (typically observed): Unable to Assess Orientation: : Oriented to Self,Oriented to Place Alcohol / Substance Use: Not Applicable Psych Involvement: No (comment)  Admission diagnosis:  Atrial flutter with rapid ventricular response [facility]) [I48.92] Patient Active Problem List   Diagnosis Date Noted  . HTN (hypertension) 11/29/2020  . Alcohol abuse 11/29/2020  . AKI (acute kidney injury) (HCC) 11/29/2020  . Atrial flutter with rapid ventricular response (HCC) 11/29/2020  . Seizure (HCC) 02/22/2020  . Pubic ramus fracture (HCC) 07/29/2019  . Acetabular fracture (HCC) 07/29/2019  . HLD (hyperlipidemia) 07/29/2019  . GERD (gastroesophageal reflux disease) 07/29/2019  . UTI (urinary tract infection) 07/29/2019   PCP:  Marisue Ivan, MD Pharmacy:   [facility] [address]), [place] - [address] [place]) [place] [address] Phone: [phone] Fax: [phone]     Social Determinants of Health (SDOH)  Interventions    Readmission Risk Interventions No flowsheet data found.

## 2020-12-02 LAB — URINE CULTURE: Culture: 60000 — AB

## 2020-12-04 ENCOUNTER — Other Ambulatory Visit: Payer: Self-pay

## 2020-12-04 LAB — CULTURE, BLOOD (ROUTINE X 2)
Culture: NO GROWTH
Culture: NO GROWTH
Special Requests: ADEQUATE

## 2020-12-04 NOTE — Patient Outreach (Signed)
[facility]) Care Management  12/04/2020  MANFRED LASPINA 1941-10-21 [id number]    EMMI-General Discharge RED ON EMMI ALERT Day # 1 Date: 12/03/2020 Red Alert Reason: "Scheduled follow-up? No"   Outreach attempt # 1 to patient. Spoke with [name](patient's caregiver due to patient documented mental status). He reports that he handles and assists patient with his medical care. He denies any acute issues or concerns regarding patient at present. Patient currently resting. Reviewed and addressed red alert. He states that he answered automated call on yesterday for patient. Patient was discharged over the weekend so he was trying to call MD office this morning to make an appt. He voices that he has not been able to get through to MD office. RN CM verified and provided caregiver with correct number for MD office. He states that patient is supposed to get [facility] visit on Tues. They called him but does not have their contact info to call if needed and RN CM provided agency number. He confirms that patient has all his meds int the home and no issues or concerns regarding them. He denies any further RN CM needs or concerns at this time.     Plan: RN CM will close case at this time.   Antionette Fairy, RN,BSN,CCM [facility] Care Management Telephonic Care Management Coordinator Direct Phone: [phone] Toll Free: [phone] Fax: [phone]

## 2020-12-05 DIAGNOSIS — F0280 Dementia in other diseases classified elsewhere without behavioral disturbance: Secondary | ICD-10-CM | POA: Diagnosis not present

## 2020-12-05 DIAGNOSIS — F32A Depression, unspecified: Secondary | ICD-10-CM | POA: Diagnosis not present

## 2020-12-05 DIAGNOSIS — I480 Paroxysmal atrial fibrillation: Secondary | ICD-10-CM | POA: Diagnosis not present

## 2020-12-05 DIAGNOSIS — E7800 Pure hypercholesterolemia, unspecified: Secondary | ICD-10-CM | POA: Diagnosis not present

## 2020-12-05 DIAGNOSIS — I4821 Permanent atrial fibrillation: Secondary | ICD-10-CM | POA: Diagnosis not present

## 2020-12-05 DIAGNOSIS — J189 Pneumonia, unspecified organism: Secondary | ICD-10-CM | POA: Diagnosis not present

## 2020-12-05 DIAGNOSIS — F1010 Alcohol abuse, uncomplicated: Secondary | ICD-10-CM | POA: Diagnosis not present

## 2020-12-05 DIAGNOSIS — E876 Hypokalemia: Secondary | ICD-10-CM | POA: Diagnosis not present

## 2020-12-05 DIAGNOSIS — G40909 Epilepsy, unspecified, not intractable, without status epilepticus: Secondary | ICD-10-CM | POA: Diagnosis not present

## 2020-12-05 DIAGNOSIS — I10 Essential (primary) hypertension: Secondary | ICD-10-CM | POA: Diagnosis not present

## 2020-12-05 DIAGNOSIS — F17210 Nicotine dependence, cigarettes, uncomplicated: Secondary | ICD-10-CM | POA: Diagnosis not present

## 2020-12-05 DIAGNOSIS — K219 Gastro-esophageal reflux disease without esophagitis: Secondary | ICD-10-CM | POA: Diagnosis not present

## 2020-12-12 DIAGNOSIS — I4821 Permanent atrial fibrillation: Secondary | ICD-10-CM | POA: Diagnosis not present

## 2020-12-12 DIAGNOSIS — F17210 Nicotine dependence, cigarettes, uncomplicated: Secondary | ICD-10-CM | POA: Diagnosis not present

## 2020-12-12 DIAGNOSIS — I10 Essential (primary) hypertension: Secondary | ICD-10-CM | POA: Diagnosis not present

## 2020-12-12 DIAGNOSIS — E876 Hypokalemia: Secondary | ICD-10-CM | POA: Diagnosis not present

## 2020-12-12 DIAGNOSIS — G40909 Epilepsy, unspecified, not intractable, without status epilepticus: Secondary | ICD-10-CM | POA: Diagnosis not present

## 2020-12-12 DIAGNOSIS — F1010 Alcohol abuse, uncomplicated: Secondary | ICD-10-CM | POA: Diagnosis not present

## 2020-12-12 DIAGNOSIS — F0280 Dementia in other diseases classified elsewhere without behavioral disturbance: Secondary | ICD-10-CM | POA: Diagnosis not present

## 2020-12-12 DIAGNOSIS — J189 Pneumonia, unspecified organism: Secondary | ICD-10-CM | POA: Diagnosis not present

## 2020-12-12 DIAGNOSIS — K219 Gastro-esophageal reflux disease without esophagitis: Secondary | ICD-10-CM | POA: Diagnosis not present

## 2020-12-14 DIAGNOSIS — I10 Essential (primary) hypertension: Secondary | ICD-10-CM | POA: Diagnosis not present

## 2020-12-14 DIAGNOSIS — F17210 Nicotine dependence, cigarettes, uncomplicated: Secondary | ICD-10-CM | POA: Diagnosis not present

## 2020-12-14 DIAGNOSIS — K219 Gastro-esophageal reflux disease without esophagitis: Secondary | ICD-10-CM | POA: Diagnosis not present

## 2020-12-14 DIAGNOSIS — J189 Pneumonia, unspecified organism: Secondary | ICD-10-CM | POA: Diagnosis not present

## 2020-12-14 DIAGNOSIS — F0280 Dementia in other diseases classified elsewhere without behavioral disturbance: Secondary | ICD-10-CM | POA: Diagnosis not present

## 2020-12-14 DIAGNOSIS — F1010 Alcohol abuse, uncomplicated: Secondary | ICD-10-CM | POA: Diagnosis not present

## 2020-12-14 DIAGNOSIS — I4821 Permanent atrial fibrillation: Secondary | ICD-10-CM | POA: Diagnosis not present

## 2020-12-14 DIAGNOSIS — G40909 Epilepsy, unspecified, not intractable, without status epilepticus: Secondary | ICD-10-CM | POA: Diagnosis not present

## 2020-12-14 DIAGNOSIS — E876 Hypokalemia: Secondary | ICD-10-CM | POA: Diagnosis not present

## 2020-12-21 DIAGNOSIS — E876 Hypokalemia: Secondary | ICD-10-CM | POA: Diagnosis not present

## 2020-12-21 DIAGNOSIS — I10 Essential (primary) hypertension: Secondary | ICD-10-CM | POA: Diagnosis not present

## 2020-12-21 DIAGNOSIS — K219 Gastro-esophageal reflux disease without esophagitis: Secondary | ICD-10-CM | POA: Diagnosis not present

## 2020-12-21 DIAGNOSIS — G40909 Epilepsy, unspecified, not intractable, without status epilepticus: Secondary | ICD-10-CM | POA: Diagnosis not present

## 2020-12-21 DIAGNOSIS — I4821 Permanent atrial fibrillation: Secondary | ICD-10-CM | POA: Diagnosis not present

## 2020-12-21 DIAGNOSIS — F0280 Dementia in other diseases classified elsewhere without behavioral disturbance: Secondary | ICD-10-CM | POA: Diagnosis not present

## 2020-12-21 DIAGNOSIS — J189 Pneumonia, unspecified organism: Secondary | ICD-10-CM | POA: Diagnosis not present

## 2020-12-21 DIAGNOSIS — F1010 Alcohol abuse, uncomplicated: Secondary | ICD-10-CM | POA: Diagnosis not present

## 2020-12-21 DIAGNOSIS — F17210 Nicotine dependence, cigarettes, uncomplicated: Secondary | ICD-10-CM | POA: Diagnosis not present

## 2020-12-25 DIAGNOSIS — F17210 Nicotine dependence, cigarettes, uncomplicated: Secondary | ICD-10-CM | POA: Diagnosis not present

## 2020-12-25 DIAGNOSIS — F1010 Alcohol abuse, uncomplicated: Secondary | ICD-10-CM | POA: Diagnosis not present

## 2020-12-25 DIAGNOSIS — I4821 Permanent atrial fibrillation: Secondary | ICD-10-CM | POA: Diagnosis not present

## 2020-12-25 DIAGNOSIS — F0280 Dementia in other diseases classified elsewhere without behavioral disturbance: Secondary | ICD-10-CM | POA: Diagnosis not present

## 2020-12-25 DIAGNOSIS — G40909 Epilepsy, unspecified, not intractable, without status epilepticus: Secondary | ICD-10-CM | POA: Diagnosis not present

## 2020-12-25 DIAGNOSIS — J189 Pneumonia, unspecified organism: Secondary | ICD-10-CM | POA: Diagnosis not present

## 2020-12-25 DIAGNOSIS — E876 Hypokalemia: Secondary | ICD-10-CM | POA: Diagnosis not present

## 2020-12-27 DIAGNOSIS — I10 Essential (primary) hypertension: Secondary | ICD-10-CM | POA: Diagnosis not present

## 2020-12-27 DIAGNOSIS — G40909 Epilepsy, unspecified, not intractable, without status epilepticus: Secondary | ICD-10-CM | POA: Diagnosis not present

## 2020-12-27 DIAGNOSIS — I4821 Permanent atrial fibrillation: Secondary | ICD-10-CM | POA: Diagnosis not present

## 2020-12-27 DIAGNOSIS — F0280 Dementia in other diseases classified elsewhere without behavioral disturbance: Secondary | ICD-10-CM | POA: Diagnosis not present

## 2020-12-27 DIAGNOSIS — E876 Hypokalemia: Secondary | ICD-10-CM | POA: Diagnosis not present

## 2020-12-27 DIAGNOSIS — F17210 Nicotine dependence, cigarettes, uncomplicated: Secondary | ICD-10-CM | POA: Diagnosis not present

## 2020-12-27 DIAGNOSIS — J189 Pneumonia, unspecified organism: Secondary | ICD-10-CM | POA: Diagnosis not present

## 2020-12-27 DIAGNOSIS — K219 Gastro-esophageal reflux disease without esophagitis: Secondary | ICD-10-CM | POA: Diagnosis not present

## 2020-12-27 DIAGNOSIS — F1010 Alcohol abuse, uncomplicated: Secondary | ICD-10-CM | POA: Diagnosis not present

## 2020-12-28 DIAGNOSIS — I480 Paroxysmal atrial fibrillation: Secondary | ICD-10-CM | POA: Diagnosis not present

## 2020-12-28 DIAGNOSIS — E7800 Pure hypercholesterolemia, unspecified: Secondary | ICD-10-CM | POA: Diagnosis not present

## 2020-12-28 DIAGNOSIS — I10 Essential (primary) hypertension: Secondary | ICD-10-CM | POA: Diagnosis not present

## 2020-12-28 DIAGNOSIS — F0280 Dementia in other diseases classified elsewhere without behavioral disturbance: Secondary | ICD-10-CM | POA: Diagnosis not present

## 2020-12-28 DIAGNOSIS — F17200 Nicotine dependence, unspecified, uncomplicated: Secondary | ICD-10-CM | POA: Diagnosis not present

## 2020-12-28 DIAGNOSIS — F0150 Vascular dementia without behavioral disturbance: Secondary | ICD-10-CM | POA: Diagnosis not present

## 2020-12-28 DIAGNOSIS — G309 Alzheimer's disease, unspecified: Secondary | ICD-10-CM | POA: Diagnosis not present

## 2020-12-28 DIAGNOSIS — Z23 Encounter for immunization: Secondary | ICD-10-CM | POA: Diagnosis not present

## 2021-01-02 DIAGNOSIS — F17210 Nicotine dependence, cigarettes, uncomplicated: Secondary | ICD-10-CM | POA: Diagnosis not present

## 2021-01-02 DIAGNOSIS — F1010 Alcohol abuse, uncomplicated: Secondary | ICD-10-CM | POA: Diagnosis not present

## 2021-01-02 DIAGNOSIS — G40909 Epilepsy, unspecified, not intractable, without status epilepticus: Secondary | ICD-10-CM | POA: Diagnosis not present

## 2021-01-02 DIAGNOSIS — J189 Pneumonia, unspecified organism: Secondary | ICD-10-CM | POA: Diagnosis not present

## 2021-01-02 DIAGNOSIS — K219 Gastro-esophageal reflux disease without esophagitis: Secondary | ICD-10-CM | POA: Diagnosis not present

## 2021-01-02 DIAGNOSIS — I4821 Permanent atrial fibrillation: Secondary | ICD-10-CM | POA: Diagnosis not present

## 2021-01-02 DIAGNOSIS — E876 Hypokalemia: Secondary | ICD-10-CM | POA: Diagnosis not present

## 2021-01-02 DIAGNOSIS — F0280 Dementia in other diseases classified elsewhere without behavioral disturbance: Secondary | ICD-10-CM | POA: Diagnosis not present

## 2021-01-02 DIAGNOSIS — I10 Essential (primary) hypertension: Secondary | ICD-10-CM | POA: Diagnosis not present

## 2021-01-03 DIAGNOSIS — I480 Paroxysmal atrial fibrillation: Secondary | ICD-10-CM | POA: Diagnosis not present

## 2021-01-03 DIAGNOSIS — F0150 Vascular dementia without behavioral disturbance: Secondary | ICD-10-CM | POA: Diagnosis not present

## 2021-01-03 DIAGNOSIS — F0280 Dementia in other diseases classified elsewhere without behavioral disturbance: Secondary | ICD-10-CM | POA: Diagnosis not present

## 2021-01-03 DIAGNOSIS — G309 Alzheimer's disease, unspecified: Secondary | ICD-10-CM | POA: Diagnosis not present

## 2021-01-03 DIAGNOSIS — R569 Unspecified convulsions: Secondary | ICD-10-CM | POA: Diagnosis not present

## 2021-03-16 DIAGNOSIS — I480 Paroxysmal atrial fibrillation: Secondary | ICD-10-CM | POA: Diagnosis not present

## 2021-03-16 DIAGNOSIS — E7800 Pure hypercholesterolemia, unspecified: Secondary | ICD-10-CM | POA: Diagnosis not present

## 2021-03-20 DIAGNOSIS — Z87898 Personal history of other specified conditions: Secondary | ICD-10-CM | POA: Diagnosis not present

## 2021-03-20 DIAGNOSIS — F17200 Nicotine dependence, unspecified, uncomplicated: Secondary | ICD-10-CM | POA: Diagnosis not present

## 2021-03-20 DIAGNOSIS — I10 Essential (primary) hypertension: Secondary | ICD-10-CM | POA: Diagnosis not present

## 2021-03-20 DIAGNOSIS — E7800 Pure hypercholesterolemia, unspecified: Secondary | ICD-10-CM | POA: Diagnosis not present

## 2021-03-20 DIAGNOSIS — M25551 Pain in right hip: Secondary | ICD-10-CM | POA: Diagnosis not present

## 2021-03-20 DIAGNOSIS — Z862 Personal history of diseases of the blood and blood-forming organs and certain disorders involving the immune mechanism: Secondary | ICD-10-CM | POA: Diagnosis not present

## 2021-03-20 DIAGNOSIS — F17218 Nicotine dependence, cigarettes, with other nicotine-induced disorders: Secondary | ICD-10-CM | POA: Diagnosis not present

## 2021-03-20 DIAGNOSIS — K219 Gastro-esophageal reflux disease without esophagitis: Secondary | ICD-10-CM | POA: Diagnosis not present

## 2021-03-20 DIAGNOSIS — G8929 Other chronic pain: Secondary | ICD-10-CM | POA: Diagnosis not present

## 2021-05-04 DIAGNOSIS — I10 Essential (primary) hypertension: Secondary | ICD-10-CM | POA: Diagnosis not present

## 2021-05-04 DIAGNOSIS — Z789 Other specified health status: Secondary | ICD-10-CM | POA: Diagnosis not present

## 2021-05-04 DIAGNOSIS — Z66 Do not resuscitate: Secondary | ICD-10-CM | POA: Diagnosis not present

## 2021-05-04 DIAGNOSIS — F0150 Vascular dementia without behavioral disturbance: Secondary | ICD-10-CM | POA: Diagnosis not present

## 2021-05-04 DIAGNOSIS — G309 Alzheimer's disease, unspecified: Secondary | ICD-10-CM | POA: Diagnosis not present

## 2021-05-04 DIAGNOSIS — F0280 Dementia in other diseases classified elsewhere without behavioral disturbance: Secondary | ICD-10-CM | POA: Diagnosis not present

## 2021-05-04 DIAGNOSIS — E7800 Pure hypercholesterolemia, unspecified: Secondary | ICD-10-CM | POA: Diagnosis not present

## 2021-05-04 DIAGNOSIS — F17200 Nicotine dependence, unspecified, uncomplicated: Secondary | ICD-10-CM | POA: Diagnosis not present

## 2021-05-04 DIAGNOSIS — I480 Paroxysmal atrial fibrillation: Secondary | ICD-10-CM | POA: Diagnosis not present

## 2021-05-07 ENCOUNTER — Other Ambulatory Visit: Payer: Self-pay

## 2021-05-07 ENCOUNTER — Observation Stay: Payer: Medicare HMO

## 2021-05-07 ENCOUNTER — Encounter: Payer: Self-pay | Admitting: Emergency Medicine

## 2021-05-07 ENCOUNTER — Emergency Department: Payer: Medicare HMO

## 2021-05-07 ENCOUNTER — Inpatient Hospital Stay
Admission: EM | Admit: 2021-05-07 | Discharge: 2021-05-11 | DRG: 066 | Disposition: A | Discharge status: Skilled Nursing Facility | Payer: Medicare HMO | Attending: Internal Medicine | Admitting: Internal Medicine

## 2021-05-07 DIAGNOSIS — R29705 NIHSS score 5: Secondary | ICD-10-CM | POA: Diagnosis present

## 2021-05-07 DIAGNOSIS — I69391 Dysphagia following cerebral infarction: Secondary | ICD-10-CM | POA: Diagnosis not present

## 2021-05-07 DIAGNOSIS — I480 Paroxysmal atrial fibrillation: Secondary | ICD-10-CM | POA: Diagnosis present

## 2021-05-07 DIAGNOSIS — Z79899 Other long term (current) drug therapy: Secondary | ICD-10-CM | POA: Diagnosis not present

## 2021-05-07 DIAGNOSIS — I6523 Occlusion and stenosis of bilateral carotid arteries: Secondary | ICD-10-CM | POA: Diagnosis not present

## 2021-05-07 DIAGNOSIS — Z9114 Patient's other noncompliance with medication regimen: Secondary | ICD-10-CM | POA: Diagnosis not present

## 2021-05-07 DIAGNOSIS — K219 Gastro-esophageal reflux disease without esophagitis: Secondary | ICD-10-CM | POA: Diagnosis present

## 2021-05-07 DIAGNOSIS — M1812 Unilateral primary osteoarthritis of first carpometacarpal joint, left hand: Secondary | ICD-10-CM | POA: Diagnosis not present

## 2021-05-07 DIAGNOSIS — R569 Unspecified convulsions: Secondary | ICD-10-CM

## 2021-05-07 DIAGNOSIS — G4089 Other seizures: Secondary | ICD-10-CM | POA: Diagnosis not present

## 2021-05-07 DIAGNOSIS — R5383 Other fatigue: Secondary | ICD-10-CM | POA: Diagnosis not present

## 2021-05-07 DIAGNOSIS — F0150 Vascular dementia without behavioral disturbance: Secondary | ICD-10-CM | POA: Diagnosis not present

## 2021-05-07 DIAGNOSIS — F0390 Unspecified dementia without behavioral disturbance: Secondary | ICD-10-CM | POA: Diagnosis not present

## 2021-05-07 DIAGNOSIS — I6340 Cerebral infarction due to embolism of unspecified cerebral artery: Secondary | ICD-10-CM

## 2021-05-07 DIAGNOSIS — Z20822 Contact with and (suspected) exposure to covid-19: Secondary | ICD-10-CM | POA: Diagnosis present

## 2021-05-07 DIAGNOSIS — F0280 Dementia in other diseases classified elsewhere without behavioral disturbance: Secondary | ICD-10-CM | POA: Diagnosis present

## 2021-05-07 DIAGNOSIS — I639 Cerebral infarction, unspecified: Secondary | ICD-10-CM | POA: Diagnosis not present

## 2021-05-07 DIAGNOSIS — I672 Cerebral atherosclerosis: Secondary | ICD-10-CM | POA: Diagnosis not present

## 2021-05-07 DIAGNOSIS — R918 Other nonspecific abnormal finding of lung field: Secondary | ICD-10-CM | POA: Diagnosis not present

## 2021-05-07 DIAGNOSIS — I63233 Cerebral infarction due to unspecified occlusion or stenosis of bilateral carotid arteries: Secondary | ICD-10-CM | POA: Diagnosis not present

## 2021-05-07 DIAGNOSIS — Z7901 Long term (current) use of anticoagulants: Secondary | ICD-10-CM | POA: Diagnosis not present

## 2021-05-07 DIAGNOSIS — Z9181 History of falling: Secondary | ICD-10-CM

## 2021-05-07 DIAGNOSIS — G40909 Epilepsy, unspecified, not intractable, without status epilepticus: Secondary | ICD-10-CM | POA: Diagnosis present

## 2021-05-07 DIAGNOSIS — Z87891 Personal history of nicotine dependence: Secondary | ICD-10-CM | POA: Diagnosis not present

## 2021-05-07 DIAGNOSIS — R404 Transient alteration of awareness: Secondary | ICD-10-CM | POA: Diagnosis not present

## 2021-05-07 DIAGNOSIS — I10 Essential (primary) hypertension: Secondary | ICD-10-CM | POA: Diagnosis not present

## 2021-05-07 DIAGNOSIS — G319 Degenerative disease of nervous system, unspecified: Secondary | ICD-10-CM | POA: Diagnosis not present

## 2021-05-07 DIAGNOSIS — I4891 Unspecified atrial fibrillation: Secondary | ICD-10-CM | POA: Diagnosis not present

## 2021-05-07 DIAGNOSIS — Z66 Do not resuscitate: Secondary | ICD-10-CM | POA: Diagnosis present

## 2021-05-07 DIAGNOSIS — Z7401 Bed confinement status: Secondary | ICD-10-CM | POA: Diagnosis not present

## 2021-05-07 DIAGNOSIS — Z823 Family history of stroke: Secondary | ICD-10-CM | POA: Diagnosis not present

## 2021-05-07 DIAGNOSIS — T17908A Unspecified foreign body in respiratory tract, part unspecified causing other injury, initial encounter: Secondary | ICD-10-CM

## 2021-05-07 DIAGNOSIS — G309 Alzheimer's disease, unspecified: Secondary | ICD-10-CM | POA: Diagnosis present

## 2021-05-07 DIAGNOSIS — I6930 Unspecified sequelae of cerebral infarction: Secondary | ICD-10-CM | POA: Diagnosis not present

## 2021-05-07 DIAGNOSIS — R2232 Localized swelling, mass and lump, left upper limb: Secondary | ICD-10-CM

## 2021-05-07 DIAGNOSIS — I6782 Cerebral ischemia: Secondary | ICD-10-CM | POA: Diagnosis not present

## 2021-05-07 DIAGNOSIS — M6281 Muscle weakness (generalized): Secondary | ICD-10-CM | POA: Diagnosis not present

## 2021-05-07 DIAGNOSIS — F1010 Alcohol abuse, uncomplicated: Secondary | ICD-10-CM | POA: Diagnosis not present

## 2021-05-07 DIAGNOSIS — M7989 Other specified soft tissue disorders: Secondary | ICD-10-CM | POA: Diagnosis not present

## 2021-05-07 DIAGNOSIS — J9811 Atelectasis: Secondary | ICD-10-CM | POA: Diagnosis not present

## 2021-05-07 DIAGNOSIS — Z8673 Personal history of transient ischemic attack (TIA), and cerebral infarction without residual deficits: Secondary | ICD-10-CM | POA: Diagnosis not present

## 2021-05-07 DIAGNOSIS — I6389 Other cerebral infarction: Secondary | ICD-10-CM | POA: Diagnosis not present

## 2021-05-07 DIAGNOSIS — Z7982 Long term (current) use of aspirin: Secondary | ICD-10-CM | POA: Diagnosis not present

## 2021-05-07 DIAGNOSIS — R410 Disorientation, unspecified: Secondary | ICD-10-CM | POA: Diagnosis not present

## 2021-05-07 DIAGNOSIS — I482 Chronic atrial fibrillation, unspecified: Secondary | ICD-10-CM | POA: Insufficient documentation

## 2021-05-07 DIAGNOSIS — I69354 Hemiplegia and hemiparesis following cerebral infarction affecting left non-dominant side: Secondary | ICD-10-CM | POA: Diagnosis not present

## 2021-05-07 DIAGNOSIS — M2550 Pain in unspecified joint: Secondary | ICD-10-CM | POA: Diagnosis not present

## 2021-05-07 DIAGNOSIS — R531 Weakness: Secondary | ICD-10-CM | POA: Diagnosis not present

## 2021-05-07 DIAGNOSIS — R2681 Unsteadiness on feet: Secondary | ICD-10-CM | POA: Diagnosis not present

## 2021-05-07 DIAGNOSIS — E785 Hyperlipidemia, unspecified: Secondary | ICD-10-CM | POA: Diagnosis not present

## 2021-05-07 DIAGNOSIS — R270 Ataxia, unspecified: Secondary | ICD-10-CM | POA: Diagnosis not present

## 2021-05-07 LAB — COMPREHENSIVE METABOLIC PANEL
ALT: 26 U/L (ref 0–44)
AST: 54 U/L — ABNORMAL HIGH (ref 15–41)
Albumin: 3.8 g/dL (ref 3.5–5.0)
Alkaline Phosphatase: 75 U/L (ref 38–126)
Anion gap: 8 (ref 5–15)
BUN: 19 mg/dL (ref 8–23)
CO2: 27 mmol/L (ref 22–32)
Calcium: 9.9 mg/dL (ref 8.9–10.3)
Chloride: 104 mmol/L (ref 98–111)
Creatinine, Ser: 1.22 mg/dL (ref 0.61–1.24)
GFR, Estimated: 60 mL/min — ABNORMAL LOW (ref >60)
Glucose, Bld: 109 mg/dL — ABNORMAL HIGH (ref 70–99)
Potassium: 3.8 mmol/L (ref 3.5–5.1)
Sodium: 139 mmol/L (ref 135–145)
Total Bilirubin: 1.4 mg/dL — ABNORMAL HIGH (ref 0.3–1.2)
Total Protein: 7 g/dL (ref 6.5–8.1)

## 2021-05-07 LAB — PROTIME-INR
INR: 0.9 (ref 0.8–1.2)
Prothrombin Time: 12.6 seconds (ref 11.4–15.2)

## 2021-05-07 LAB — RESP PANEL BY RT-PCR (FLU A&B, COVID) ARPGX2
Influenza A by PCR: NEGATIVE
Influenza B by PCR: NEGATIVE
SARS Coronavirus 2 by RT PCR: NEGATIVE

## 2021-05-07 LAB — CBC
HCT: 35.7 % — ABNORMAL LOW (ref 39.0–52.0)
Hemoglobin: 12.4 g/dL — ABNORMAL LOW (ref 13.0–17.0)
MCH: 36.4 pg — ABNORMAL HIGH (ref 26.0–34.0)
MCHC: 34.7 g/dL (ref 30.0–36.0)
MCV: 104.7 fL — ABNORMAL HIGH (ref 80.0–100.0)
Platelets: 183 10*3/uL (ref 150–400)
RBC: 3.41 MIL/uL — ABNORMAL LOW (ref 4.22–5.81)
RDW: 18.8 % — ABNORMAL HIGH (ref 11.5–15.5)
WBC: 6.9 10*3/uL (ref 4.0–10.5)
nRBC: 0 % (ref 0.0–0.2)

## 2021-05-07 LAB — DIFFERENTIAL
Abs Immature Granulocytes: 0.04 10*3/uL (ref 0.00–0.07)
Basophils Absolute: 0 10*3/uL (ref 0.0–0.1)
Basophils Relative: 0 %
Eosinophils Absolute: 0.3 10*3/uL (ref 0.0–0.5)
Eosinophils Relative: 4 %
Immature Granulocytes: 1 %
Lymphocytes Relative: 24 %
Lymphs Abs: 1.6 10*3/uL (ref 0.7–4.0)
Monocytes Absolute: 1 10*3/uL (ref 0.1–1.0)
Monocytes Relative: 15 %
Neutro Abs: 3.9 10*3/uL (ref 1.7–7.7)
Neutrophils Relative %: 56 %

## 2021-05-07 LAB — MAGNESIUM: Magnesium: 1.6 mg/dL — ABNORMAL LOW (ref 1.7–2.4)

## 2021-05-07 LAB — APTT: aPTT: 30 seconds (ref 24–36)

## 2021-05-07 LAB — PHOSPHORUS: Phosphorus: 2.9 mg/dL (ref 2.5–4.6)

## 2021-05-07 MED ORDER — LORAZEPAM 1 MG PO TABS: 1.0000 mg | ORAL_TABLET | ORAL | Status: AC | PRN

## 2021-05-07 MED ORDER — ASPIRIN 325 MG PO TABS
325.0000 mg | ORAL_TABLET | Freq: Every day | ORAL | Status: DC
  Administered 2021-05-07 – 2021-05-08 (×2): 325 mg via ORAL
  Filled 2021-05-07 (×3): qty 1

## 2021-05-07 MED ORDER — LORAZEPAM 1 MG PO TABS
0.0000 mg | ORAL_TABLET | Freq: Four times a day (QID) | ORAL | Status: AC
  Administered 2021-05-08 – 2021-05-09 (×3): 2 mg via ORAL
  Filled 2021-05-07 (×4): qty 2

## 2021-05-07 MED ORDER — SODIUM CHLORIDE 0.9% FLUSH: 3.0000 mL | Freq: Once | INTRAVENOUS | Status: DC

## 2021-05-07 MED ORDER — ADULT MULTIVITAMIN W/MINERALS CH
1.0000 | ORAL_TABLET | Freq: Every day | ORAL | Status: DC
  Administered 2021-05-07 – 2021-05-11 (×5): 1 via ORAL
  Filled 2021-05-07 (×5): qty 1

## 2021-05-07 MED ORDER — THIAMINE HCL 100 MG PO TABS
100.0000 mg | ORAL_TABLET | Freq: Every day | ORAL | Status: DC
  Administered 2021-05-07 – 2021-05-11 (×5): 100 mg via ORAL
  Filled 2021-05-07 (×5): qty 1

## 2021-05-07 MED ORDER — ACETAMINOPHEN 160 MG/5ML PO SOLN
650.0000 mg | ORAL | Status: DC | PRN
  Filled 2021-05-07: qty 20.3

## 2021-05-07 MED ORDER — STROKE: EARLY STAGES OF RECOVERY BOOK: Freq: Once | Status: AC

## 2021-05-07 MED ORDER — ASPIRIN 81 MG PO CHEW
324.0000 mg | CHEWABLE_TABLET | Freq: Once | ORAL | Status: AC
  Administered 2021-05-07: 324 mg via ORAL
  Filled 2021-05-07: qty 4

## 2021-05-07 MED ORDER — MAGNESIUM OXIDE -MG SUPPLEMENT 400 (240 MG) MG PO TABS
400.0000 mg | ORAL_TABLET | Freq: Every day | ORAL | Status: DC
  Administered 2021-05-08 – 2021-05-11 (×4): 400 mg via ORAL
  Filled 2021-05-07 (×4): qty 1

## 2021-05-07 MED ORDER — TRAZODONE HCL 100 MG PO TABS
200.0000 mg | ORAL_TABLET | Freq: Every day | ORAL | Status: DC
  Administered 2021-05-07: 200 mg via ORAL
  Filled 2021-05-07 (×2): qty 2

## 2021-05-07 MED ORDER — THIAMINE HCL 100 MG/ML IJ SOLN: 100.0000 mg | Freq: Every day | INTRAMUSCULAR | Status: DC

## 2021-05-07 MED ORDER — LEVETIRACETAM 500 MG PO TABS
500.0000 mg | ORAL_TABLET | Freq: Two times a day (BID) | ORAL | Status: DC
  Administered 2021-05-07 – 2021-05-11 (×8): 500 mg via ORAL
  Filled 2021-05-07 (×9): qty 1

## 2021-05-07 MED ORDER — ASPIRIN 300 MG RE SUPP: 300.0000 mg | Freq: Every day | RECTAL | Status: DC

## 2021-05-07 MED ORDER — LORAZEPAM 2 MG/ML IJ SOLN
1.0000 mg | INTRAMUSCULAR | Status: AC | PRN
Start: 2021-05-07 — End: 2021-05-10

## 2021-05-07 MED ORDER — DONEPEZIL HCL 5 MG PO TABS: 10.0000 mg | ORAL_TABLET | Freq: Every day | ORAL | Status: DC

## 2021-05-07 MED ORDER — FOLIC ACID 1 MG PO TABS
1.0000 mg | ORAL_TABLET | Freq: Every day | ORAL | Status: DC
  Administered 2021-05-07 – 2021-05-11 (×5): 1 mg via ORAL
  Filled 2021-05-07 (×5): qty 1

## 2021-05-07 MED ORDER — ATORVASTATIN CALCIUM 20 MG PO TABS
80.0000 mg | ORAL_TABLET | Freq: Every day | ORAL | Status: DC
  Administered 2021-05-07 – 2021-05-08 (×2): 80 mg via ORAL
  Filled 2021-05-07 (×2): qty 4

## 2021-05-07 MED ORDER — ACETAMINOPHEN 325 MG PO TABS
650.0000 mg | ORAL_TABLET | ORAL | Status: DC | PRN
  Filled 2021-05-07: qty 2

## 2021-05-07 MED ORDER — LORAZEPAM 1 MG PO TABS: 0.0000 mg | ORAL_TABLET | Freq: Two times a day (BID) | ORAL | Status: AC

## 2021-05-07 MED ORDER — ESCITALOPRAM OXALATE 10 MG PO TABS: 10.0000 mg | ORAL_TABLET | Freq: Every day | ORAL | Status: DC

## 2021-05-07 MED ORDER — ACETAMINOPHEN 650 MG RE SUPP: 650.0000 mg | RECTAL | Status: DC | PRN

## 2021-05-07 MED ORDER — PANTOPRAZOLE SODIUM 40 MG PO TBEC
40.0000 mg | DELAYED_RELEASE_TABLET | Freq: Every day | ORAL | Status: DC
  Administered 2021-05-08 – 2021-05-11 (×4): 40 mg via ORAL
  Filled 2021-05-07 (×4): qty 1

## 2021-05-07 NOTE — Plan of Care (Signed)
Report given to Nunzio Cobbs, RN - Will continue care once transferred to 116, floor 1c.

## 2021-05-07 NOTE — ED Notes (Signed)
Katie RN and this RN at bedside, pt is out of the bed and has urinated on the floor and into the trash can. Pt instructed to use the call bell and urinal. Pt not confused and easily redirectable.

## 2021-05-07 NOTE — ED Triage Notes (Signed)
Left arm weakness since Saturday.   Arrives with caregiver.

## 2021-05-07 NOTE — ED Notes (Signed)
X-Ray at bedside.

## 2021-05-07 NOTE — ED Notes (Signed)
EDP Kinner at bedside for reevaluation

## 2021-05-07 NOTE — ED Notes (Signed)
Informed RN bed assigned 

## 2021-05-07 NOTE — H&P (Signed)
History and Physical    Richard Carter VFI:[id number] DOB: 1941-07-11 DOA: 05/07/2021  PCP: Marisue Ivan, MD   Patient coming from: Home  I have personally briefly reviewed patient's old medical records in [facility]  Chief Complaint: Left arm weakness Most of the history was obtained from patient's unable and caretaker [name] over the phone.  Patient has a history of dementia and is unreliable.  HPI: Richard Carter is a 80 y.o. male with medical history significant for paroxysmal atrial fibrillation not on anticoagulation due to history of medication noncompliance and increased risk of fall, historyof alcohol and nicotine dependence, history of vascular and Alzheimer's dementia who presents to the emergency room for evaluation of left arm weakness.  His neighbor states that his symptoms started between 4 and 6 PM, 2 days prior to his presentation to the ER.  He had seen him earlier in the day and patient was fine but when he went back to check on him at about 6 PM patient complained about weakness involving his left hand.  He was unable to pick up items with that hand but refused to come to the hospital to get checked.  He was finally able to convince him to come in on the day of admission. He denies having any chest pain, no shortness of breath, no headache, no difficulty swallowing, no dizziness, no lightheadedness, no fall, no blurred vision, no focal deficits, no fever, no chills, no urinary symptoms or changes in his bowel habits. Labs show sodium 139, potassium 3.8, chloride 104, bicarb 27, glucose 109, BUN 19, creatinine 1.2, calcium 9.9, alkaline phosphatase 75, albumin 3.8, AST 54, ALT 26, total protein 7.0, white count 6.9, hemoglobin 12.4, hematocrit 35.7, MCV 104.7, RDW 18.8, platelet count 183 Respiratory viral panel is negative Chest x-ray reviewed by me shows no evidence of acute cardiopulmonary disease. CT scan of the head without contrast shows no acute findings.   Generalized atrophy with chronic small vessel ischemia. MRI of the brain without contrast showed Small acute infarcts in the right frontoparietal lobes, right posterior lentiform nucleus, left hippocampus, and left cerebellum. There is notable involvement of the right hand motor region. Chronic infarcts and chronic microvascular ischemic changes as seen previously. Few chronic microhemorrhages secondary to hypertension and/or amyloid angiopathy. Twelve-lead EKG reviewed by me shows sinus rhythm with borderline left axis deviation.  ED Course: Patient is an 80 year old male who presents to the emergency room for evaluation of left arm weakness which started 2 days prior to his presentation.  He has a history of paroxysmal atrial fibrillation and is not on anticoagulation due to history of medication noncompliance and increased risk for falls. MRI shows small acute infarcts in the right frontal parietal lobes, right posterior lentiform nucleus, left hippocampus and left cerebellum. He will be admitted to the hospital for further evaluation.     Review of Systems: As per HPI otherwise all other systems reviewed and negative.    Past Medical History:  Diagnosis Date   Alcohol abuse    Dementia (HCC)    GERD (gastroesophageal reflux disease)    HLD (hyperlipidemia)    HTN (hypertension)     Past Surgical History:  Procedure Laterality Date   skin cyst removal       reports that he has been smoking. He has never used smokeless tobacco. He reports that he does not drink alcohol. No history on file for drug use.  No Known Allergies  Family History  Problem Relation Age  of Onset   Stroke Mother    Cancer Brother       Prior to Admission medications   Medication Sig Start Date End Date Taking? Authorizing Provider  aspirin EC 81 MG tablet Take 81 mg by mouth daily. Swallow whole.    [provider]  atorvastatin (LIPITOR) 20 MG tablet Take 20 mg by mouth daily.    [provider]  donepezil (ARICEPT) 10 MG tablet Take 10 mg by mouth at bedtime.    [provider]  escitalopram (LEXAPRO) 10 MG tablet Take 10 mg by mouth daily.    [provider]  levETIRAcetam (KEPPRA) 500 MG tablet Take 1 tablet (500 mg total) by mouth 2 (two) times daily. 12/01/20   Danford, Earl Lites, MD  magnesium oxide (MAG-OX) 400 MG tablet Take 400 mg by mouth daily.    [provider]  metoprolol succinate (TOPROL-XL) 50 MG 24 hr tablet Take 1 tablet (50 mg total) by mouth daily. 12/01/20   Danford, Earl Lites, MD  omeprazole (PRILOSEC) 40 MG capsule Take 40 mg by mouth daily.    [provider]  potassium chloride SA (KLOR-CON) 20 MEQ tablet Take 20 mEq by mouth 2 (two) times daily. 06/23/20   [provider]  traZODone (DESYREL) 100 MG tablet Take 200 mg by mouth at bedtime. 05/03/19   [provider]    Physical Exam: Vitals:   05/07/21 0930 05/07/21 1000 05/07/21 1230 05/07/21 1245  BP: (!) 150/99 (!) 182/80 (!) 183/97   Pulse: 64 (!) 57 94 (!) 59  Resp: 19 18  20   Temp:      TempSrc:      SpO2: 98% 100%  100%  Weight:      Height:         Vitals:   05/07/21 0930 05/07/21 1000 05/07/21 1230 05/07/21 1245  BP: (!) 150/99 (!) 182/80 (!) 183/97   Pulse: 64 (!) 57 94 (!) 59  Resp: 19 18  20   Temp:      TempSrc:      SpO2: 98% 100%  100%  Weight:      Height:          Constitutional: Alert and oriented x 2, person and place. Not in any apparent distress HEENT:      Head: Normocephalic and atraumatic.         Eyes: PERLA, EOMI, Conjunctivae are normal. Sclera is non-icteric.       Mouth/Throat: Mucous membranes are moist.       Neck: Supple with no signs of meningismus. Cardiovascular: Regular rate and rhythm. No murmurs, gallops, or rubs. 2+ symmetrical distal pulses are present . No JVD. No LE edema Respiratory: Respiratory effort normal .Lungs sounds clear bilaterally. No wheezes, crackles, or rhonchi.   Gastrointestinal: Soft, non tender, and non distended with positive bowel sounds.  Genitourinary: No CVA tenderness. Musculoskeletal: Nontender with normal range of motion in all extremities. No cyanosis, or erythema of extremities. Neurologic:  Face is symmetric. Moving all extremities. No gross focal neurologic deficits. Able to move all extremities. Skin: Skin is warm, dry.  No rash or ulcers Psychiatric: Mood and affect are normal    Labs on Admission: I have personally reviewed following labs and imaging studies  CBC: Recent Labs  Lab 05/07/21 0900  WBC 6.9  NEUTROABS 3.9  HGB 12.4*  HCT 35.7*  MCV 104.7*  PLT 183   Basic Metabolic Panel: Recent Labs  Lab 05/07/21 0900  NA 139  K 3.8  CL 104  CO2 27  GLUCOSE 109*  BUN 19  CREATININE 1.22  CALCIUM 9.9   GFR: Estimated Creatinine Clearance: 41.7 mL/min (by C-G formula based on SCr of 1.22 mg/dL). Liver Function Tests: Recent Labs  Lab 05/07/21 0900  AST 54*  ALT 26  ALKPHOS 75  BILITOT 1.4*  PROT 7.0  ALBUMIN 3.8   No results for input(s): LIPASE, AMYLASE in the last 168 hours. No results for input(s): AMMONIA in the last 168 hours. Coagulation Profile: Recent Labs  Lab 05/07/21 0900  INR 0.9   Cardiac Enzymes: No results for input(s): CKTOTAL, CKMB, CKMBINDEX, TROPONINI in the last 168 hours. BNP (last 3 results) No results for input(s): PROBNP in the last 8760 hours. HbA1C: No results for input(s): HGBA1C in the last 72 hours. CBG: No results for input(s): GLUCAP in the last 168 hours. Lipid Profile: No results for input(s): CHOL, HDL, LDLCALC, TRIG, CHOLHDL, LDLDIRECT in the last 72 hours. Thyroid Function Tests: No results for input(s): TSH, T4TOTAL, FREET4, T3FREE, THYROIDAB in the last 72 hours. Anemia Panel: No results for input(s): VITAMINB12, FOLATE, FERRITIN, TIBC, IRON, RETICCTPCT in the last 72 hours. Urine analysis:    Component Value Date/Time   COLORURINE YELLOW (A)  11/29/2020 1539   APPEARANCEUR HAZY (A) 11/29/2020 1539   APPEARANCEUR Clear 06/12/2014 1029   LABSPEC 1.015 11/29/2020 1539   LABSPEC 1.005 06/12/2014 1029   PHURINE 7.0 11/29/2020 1539   GLUCOSEU NEGATIVE 11/29/2020 1539   GLUCOSEU Negative 06/12/2014 1029   HGBUR NEGATIVE 11/29/2020 1539   BILIRUBINUR NEGATIVE 11/29/2020 1539   BILIRUBINUR Negative 06/12/2014 1029   KETONESUR NEGATIVE 11/29/2020 1539   PROTEINUR 30 (A) 11/29/2020 1539   NITRITE POSITIVE (A) 11/29/2020 1539   LEUKOCYTESUR LARGE (A) 11/29/2020 1539   LEUKOCYTESUR Negative 06/12/2014 1029    Radiological Exams on Admission: CT HEAD WO CONTRAST  Result Date: 05/07/2021 CLINICAL DATA:  Left arm weakness since Saturday EXAM: CT HEAD WITHOUT CONTRAST TECHNIQUE: Contiguous axial images were obtained from the base of the skull through the vertex without intravenous contrast. COMPARISON:  02/22/2020 FINDINGS: Brain: No evidence of acute infarction, hemorrhage, hydrocephalus, extra-axial collection or mass lesion/mass effect. Generalized atrophy. Overall mild for age chronic small vessel ischemia in the cerebral white matter including a chronic lacune at the left corona radiata. Possible chronic lacune at the right pons. Vascular: Atheromatous calcification. Skull: Negative Sinuses/Orbits: Negative IMPRESSION: 1. No acute finding. 2. Generalized atrophy with chronic small vessel ischemia. Electronically Signed   By: Marnee Spring M.D.   On: 05/07/2021 09:22   MR BRAIN WO CONTRAST  Result Date: 05/07/2021 CLINICAL DATA:  TIA, left arm weakness EXAM: MRI HEAD WITHOUT CONTRAST TECHNIQUE: Multiplanar, multiecho pulse sequences of the brain and surrounding structures were obtained without intravenous contrast. COMPARISON:  02/23/2020 FINDINGS: Brain: Small areas of cortical restricted diffusion are present in the right frontoparietal lobes with notable involvement of the precentral gyrus in the hand motor region. Subtle corresponding  loss of gray-white differentiation is noted on the prior CT. Additional small foci of reduced diffusion are present in the right posterior lentiform nucleus, left hippocampus, and left cerebellum. Few scattered foci of susceptibility in the cerebral subcortical white matter, left thalamus, and left cerebellum compatible with chronic microhemorrhages. Patchy and confluent areas of T2 hyperintensity in the supratentorial white matter are nonspecific but probably reflect mild to moderate chronic microvascular ischemic changes similar to the prior study. Chronic cortical/subcortical infarcts of the left frontal  lobe. Small chronic infarcts of bilateral basal ganglia and corona radiata and right pons. Prominence of the ventricles and sulci reflects generalized parenchymal volume loss similar to the prior study. There is no intracranial mass or mass effect. No hydrocephalus or extra-axial collection. Vascular: Major vessel flow voids at the skull base are preserved. Skull and upper cervical spine: Normal marrow signal is preserved. Sinuses/Orbits: Paranasal sinuses are aerated. Orbits are unremarkable. Other: Sella is unremarkable.  Mastoid air cells are clear. IMPRESSION: Small acute infarcts in the right frontoparietal lobes, right posterior lentiform nucleus, left hippocampus, and left cerebellum. There is notable involvement of the right hand motor region. Chronic infarcts and chronic microvascular ischemic changes as seen previously. Few chronic microhemorrhages secondary to hypertension and/or amyloid angiopathy. Electronically Signed   By: Guadlupe SpanishPraneil  Patel M.D.   On: 05/07/2021 10:48   DG Chest Port 1 View  Result Date: 05/07/2021 CLINICAL DATA:  Left-sided weakness EXAM: PORTABLE CHEST 1 VIEW COMPARISON:  11/29/2020 FINDINGS: Decreased lung volume with mild bibasilar atelectasis. No acute infiltrate or edema. No pleural effusion. Chronic left rib fractures. IMPRESSION: No active disease. Electronically Signed    By: Marlan Palauharles  Clark M.D.   On: 05/07/2021 09:03     Assessment/Plan Principal Problem:   Acute CVA (cerebrovascular accident) (HCC) Active Problems:   Seizure (HCC)   HTN (hypertension)   Alcohol abuse   Non compliance w medication regimen   Dementia (HCC)   AF (paroxysmal atrial fibrillation) (HCC)      Acute CVA Probably embolic stroke Patient has a history of paroxysmal A. fib and is not on anticoagulation due to medication noncompliance and increased risk for falls We will place patient on aspirin 325 mg daily Place patient on high intensity statins Obtain 2D echocardiogram to rule out cardiac thrombus We will request ST/PT/OT consults Allow for permissive hypertension    History of nicotine dependence Smoking cessation has been discussed with patient in detail We will place a nicotine transdermal patch 21 mg daily    Seizure disorder  Continue Keppra Place patient on seizure prophylaxis.     Dementia Mixed (vascular and Alzheimer dementia) Continue Aricept, Lexapro and trazodone    History of alcohol abuse We will place patient on CIWA protocol and administer lorazepam for CIWA score of 8 or greater. Will place patient on MVI, thiamine and folic acid.  DVT prophylaxis:SCD Code Status: full code  Family Communication: Greater than 50% of time was spent discussing patient's condition and plan of care with his care taker Thayer OhmChris over the phone.  All questions and concerns have been addressed.  He verbalizes understanding and agrees with the plan Disposition Plan: Back to previous home environment Consults called: Neurology Status: Observation    Kaedyn Belardo MD [facility]     05/07/2021, 2:18 PM

## 2021-05-07 NOTE — ED Notes (Signed)
Pt transported to MRI 

## 2021-05-07 NOTE — ED Notes (Signed)
Pt back from MRI 

## 2021-05-07 NOTE — Evaluation (Signed)
Occupational Therapy Evaluation Patient Details Name: IMAAD REUSS MRN: [id number] DOB: Dec 22, 1940 Today's Date: 05/07/2021    History of Present Illness CHALMER ZHENG is an 80 y.o. male with PMH including paroxysmal atrial fibrillation (not on anticoagulation due to history of medication noncompliance and increased risk of fall), historyof alcohol and nicotine dependence, history of vascular and Alzheimer's dementia who presents to the emergency room for evaluation of left arm weakness.  MRI significant for small acute infarcts in the right frontal parietal lobes, right posterior lentiform nucleus, left hippocampus and left cerebellum.  He will be admitted to the hospital for further evaluation.   Clinical Impression   Mr. Heinle presents to OT with impaired cognition, generalized weakness, and impaired balance that impacts his ability to safely and independently engage in self care tasks.  He was oriented to self/setting/situation and able to follow one-step commands, though was disoriented to date and provided some conflicting information re: home setup and prior level of functioning.  Per pt report, he was able to complete basic ADLs with mod I using SPC prior to admission.  He receives assistance from his neighbor for all IADLs.  Currently, pt is able to provide upper body seated ADLs with intermittent min A due to previous R shoulder injury.  He is able to provide ADLs involving functional mobility with min A for balance, though did not have access to [facility] in today's evaluation.  No numbness, tingling, visual deficits, or neglect observed in neurological assessment.  Mr. Amadon will likely continue to benefit from skilled OT services in acute setting to address safety awareness, balance, functional strengthening, and safety and independence in self care tasks.  Recommend HHOT upon discharge, though will require continued supervision and support from his neighbor for safety.  If pt's neighbor  is unable to provide daily assistance, recommend short-term rehab to maximize strength and safety.    Follow Up Recommendations  Home health OT;Supervision - Intermittent    Equipment Recommendations  3 in 1 bedside commode    Recommendations for Other Services       Precautions / Restrictions Precautions Precautions: Fall Restrictions Weight Bearing Restrictions: No      Mobility Bed Mobility Overal bed mobility: Modified Independent             General bed mobility comments: extra time/effort    Transfers Overall transfer level: Needs assistance Equipment used: 1 person hand held assist Transfers: Sit to/from Stand;Stand Pivot Transfers Sit to Stand: Min assist Stand pivot transfers: Min assist            Balance Overall balance assessment: Needs assistance Sitting-balance support: Single extremity supported;Feet unsupported Sitting balance-Leahy Scale: Fair Sitting balance - Comments: unable to tolerate resistance, though feet unsupported in ED bed   Standing balance support: Single extremity supported Standing balance-Leahy Scale: Poor Standing balance comment: required min A to maintain balance, anticipate improvement with use of LRAD                           ADL either performed or assessed with clinical judgement   ADL Overall ADL's : Needs assistance/impaired Eating/Feeding: Set up;Sitting   Grooming: Set up;Sitting   Upper Body Bathing: Minimal assistance;Sitting Upper Body Bathing Details (indicate cue type and reason): anticipate intermittent min A needed due to R shoulder injury/limited ROM     Upper Body Dressing : Minimal assistance;Sitting Upper Body Dressing Details (indicate cue type and reason): anticipate intermittent min A  needed due to R shoulder injury/limited ROM                 Functional mobility during ADLs: Minimal assistance;Cueing for safety General ADL Comments: required supervision with bed mobility,  min A for sit to stand transfer and standing balance     Vision Baseline Vision/History: Wears glasses Wears Glasses: Distance only Patient Visual Report: No change from baseline Vision Assessment?: Yes Eye Alignment: Within Functional Limits Ocular Range of Motion: Within Functional Limits Alignment/Gaze Preference: Within Defined Limits Tracking/Visual Pursuits: Able to track stimulus in all quads without difficulty Saccades: Within functional limits Visual Fields: No apparent deficits     Perception     Praxis      Pertinent Vitals/Pain Pain Assessment: No/denies pain     Hand Dominance Right   Extremity/Trunk Assessment Upper Extremity Assessment Upper Extremity Assessment: Generalized weakness;RUE deficits/detail;LUE deficits/detail RUE Deficits / Details: Shoulder flexion ~80 due to rotator cuff injury RUE Sensation: WNL RUE Coordination: decreased fine motor LUE Deficits / Details: Previous reports of LUE weakness, but appears improved LUE Sensation: WNL LUE Coordination: decreased fine motor   Lower Extremity Assessment Lower Extremity Assessment: Generalized weakness       Communication Communication Communication: No difficulties   Cognition Arousal/Alertness: Awake/alert Behavior During Therapy: WFL for tasks assessed/performed Overall Cognitive Status: History of cognitive impairments - at baseline                                 General Comments: Pt with history of dementia. Oriented to self, setting, and situation, though did provide conflicting information at times (reports he drives, then states he relies on his neighbor for driving).  Disoriented to date (states month is October, year is 2001).  Able to follow one-step commands.   General Comments       Exercises Other Exercises Other Exercises: provided education re: OT role and plan of care, fall and safety precautions, self care, orientation strategies   Shoulder Instructions       Home Living Family/patient expects to be discharged to:: Private residence Living Arrangements: Alone Available Help at Discharge: Neighbor Type of Home: Mobile home Home Access: Stairs to enter Entrance Stairs-Number of Steps: 2 Entrance Stairs-Rails:  (patient reports one rail in the middle, can use right hand or left hand) Home Layout: One level     Bathroom Shower/Tub: Producer, television/film/video: Standard     Home Equipment: Cane - single point;Shower seat - built in;Wheelchair - manual;Grab bars - tub/shower;Grab bars - toilet          Prior Functioning/Environment Level of Independence: Needs assistance  Gait / Transfers Assistance Needed: Uses SPC ADL's / Homemaking Assistance Needed: Pt performs BADLs independently, receives assistance from neighbor for all IADLs (medications, driving, cleaning, yardwork, cooking)   Comments: Denies fall history in past year.  Spends his days mostly watching TV.        OT Problem List: Decreased strength;Decreased range of motion;Decreased activity tolerance;Impaired balance (sitting and/or standing);Decreased cognition;Decreased safety awareness;Decreased knowledge of use of DME or AE      OT Treatment/Interventions: Self-care/ADL training;Therapeutic exercise;Neuromuscular education;DME and/or AE instruction;Therapeutic activities;Cognitive remediation/compensation;Patient/family education;Balance training    OT Goals(Current goals can be found in the care plan section) Acute Rehab OT Goals Patient Stated Goal: to get some rest OT Goal Formulation: With patient Time For Goal Achievement: 05/21/21 Potential to Achieve Goals: Good  OT Frequency:  Min 2X/week   Barriers to D/C:            Co-evaluation              AM-PAC OT "6 Clicks" Daily Activity     Outcome Measure Help from another person eating meals?: None Help from another person taking care of personal grooming?: A Little Help from another person  toileting, which includes using toliet, bedpan, or urinal?: A Little Help from another person bathing (including washing, rinsing, drying)?: A Little Help from another person to put on and taking off regular upper body clothing?: A Little Help from another person to put on and taking off regular lower body clothing?: A Little 6 Click Score: 19   End of Session    Activity Tolerance: Patient tolerated treatment well Patient left: in bed;with call bell/phone within reach  OT Visit Diagnosis: Unsteadiness on feet (R26.81);Muscle weakness (generalized) (M62.81)                Time: 1533-1550 OT Time Calculation (min): 17 min Charges:  OT General Charges $OT Visit: 1 Visit OT Evaluation $OT Eval Moderate Complexity: 1 Mod  Madgeline Rayo, OTR/L 05/07/21, 4:15 PM

## 2021-05-07 NOTE — ED Notes (Signed)
Attempted to obtain IV access. 2 unsuccessful attempts. Lab called to obtain bloodwork

## 2021-05-07 NOTE — ED Provider Notes (Signed)
[facility] Emergency Department Provider Note   ____________________________________________    I have reviewed the triage vital signs and the nursing notes.   HISTORY  Chief Complaint Weakness     HPI Richard Carter is a 80 y.o. male presents with complaints of weakness.  Patient with history as noted below.  Including a history of dementia.  His neighbor is here with him, reports that he comes over and checks on him most days.  He reports 2 days ago patient was unable to use his left arm or left leg, this improved gradually over the weekend, patient refused to come to the hospital.  Today the patient states that he feels weak all over.  No reports of fevers or chills.  Past Medical History:  Diagnosis Date   Alcohol abuse    Dementia (HCC)    GERD (gastroesophageal reflux disease)    HLD (hyperlipidemia)    HTN (hypertension)     Patient Active Problem List   Diagnosis Date Noted   Acute CVA (cerebrovascular accident) (HCC) 05/07/2021   HTN (hypertension) 11/29/2020   Alcohol abuse 11/29/2020   AKI (acute kidney injury) (HCC) 11/29/2020   Atrial flutter with rapid ventricular response (HCC) 11/29/2020   Seizure (HCC) 02/22/2020   Pubic ramus fracture (HCC) 07/29/2019   Acetabular fracture (HCC) 07/29/2019   HLD (hyperlipidemia) 07/29/2019   GERD (gastroesophageal reflux disease) 07/29/2019   UTI (urinary tract infection) 07/29/2019    Past Surgical History:  Procedure Laterality Date   skin cyst removal      Prior to Admission medications   Medication Sig Start Date End Date Taking? Authorizing Provider  aspirin EC 81 MG tablet Take 81 mg by mouth daily. Swallow whole.    [provider]  atorvastatin (LIPITOR) 20 MG tablet Take 20 mg by mouth daily.    [provider]  donepezil (ARICEPT) 10 MG tablet Take 10 mg by mouth at bedtime.    [provider]  escitalopram (LEXAPRO) 10 MG tablet Take 10 mg by  mouth daily.    [provider]  levETIRAcetam (KEPPRA) 500 MG tablet Take 1 tablet (500 mg total) by mouth 2 (two) times daily. 12/01/20   Danford, Earl Lites, MD  magnesium oxide (MAG-OX) 400 MG tablet Take 400 mg by mouth daily.    [provider]  metoprolol succinate (TOPROL-XL) 50 MG 24 hr tablet Take 1 tablet (50 mg total) by mouth daily. 12/01/20   Danford, Earl Lites, MD  omeprazole (PRILOSEC) 40 MG capsule Take 40 mg by mouth daily.    [provider]  potassium chloride SA (KLOR-CON) 20 MEQ tablet Take 20 mEq by mouth 2 (two) times daily. 06/23/20   [provider]  traZODone (DESYREL) 100 MG tablet Take 200 mg by mouth at bedtime. 05/03/19   [provider]     Allergies Patient has no known allergies.  Family History  Problem Relation Age of Onset   Stroke Mother    Cancer Brother     Social History Social History   Tobacco Use   Smoking status: Every Day    Pack years: 0.00   Smokeless tobacco: Never  Vaping Use   Vaping Use: Never used  Substance Use Topics   Alcohol use: No    Review of Systems  Constitutional: No fever/chills.  Diffuse weakness Eyes: No visual changes.  ENT: No sore throat. Cardiovascular: Denies chest pain. Respiratory: Denies shortness of breath. Gastrointestinal: No abdominal pain.  No nausea, no vomiting.   Genitourinary: Negative for dysuria. Musculoskeletal: Negative for back pain. Skin: Negative for rash. Neurological: Negative for headaches or weakness   ____________________________________________   PHYSICAL EXAM:  VITAL SIGNS: ED Triage Vitals  Enc Vitals Group     BP 05/07/21 0830 (!) 152/94     Pulse Rate 05/07/21 0830 78     Resp 05/07/21 0830 18     Temp 05/07/21 0830 98.3 F (36.8 C)     Temp Source 05/07/21 0830 Oral     SpO2 05/07/21 0830 99 %     Weight 05/07/21 0824 62.1 kg (136 lb 14.5 oz)     Height 05/07/21 0824 1.702 m (5\' 7" )     Head Circumference --       Peak Flow --      Pain Score 05/07/21 0824 0     Pain Loc --      Pain Edu? --      Excl. in GC? --     Constitutional: Alert , No acute distress. Pleasant and interactive Eyes: Conjunctivae are normal.  Head: Atraumatic. Nose: No congestion/rhinnorhea. Mouth/Throat: Mucous membranes are moist.    Cardiovascular: Normal rate, regular rhythm. Grossly normal heart sounds.  Good peripheral circulation. Respiratory: Normal respiratory effort.  No retractions. Lungs CTAB. Gastrointestinal: Soft and nontender. No distention.  No CVA tenderness.  Musculoskeletal: No lower extremity tenderness nor edema.  Warm and well perfused Neurologic:  Normal speech and language. No gross focal neurologic deficits are appreciated.  Question mild left loss of nasolabial fold, strength appears normal in all extremities Skin:  Skin is warm, dry and intact. No rash noted. Psychiatric: Mood and affect are normal. Speech and behavior are normal.  ____________________________________________   LABS (all labs ordered are listed, but only abnormal results are displayed)  Labs Reviewed  CBC - Abnormal; Notable for the following components:      Result Value   RBC 3.41 (*)    Hemoglobin 12.4 (*)    HCT 35.7 (*)    MCV 104.7 (*)    MCH 36.4 (*)    RDW 18.8 (*)    All other components within normal limits  COMPREHENSIVE METABOLIC PANEL - Abnormal; Notable for the following components:   Glucose, Bld 109 (*)    AST 54 (*)    Total Bilirubin 1.4 (*)    GFR, Estimated 60 (*)    All other components within normal limits  RESP PANEL BY RT-PCR (FLU A&B, COVID) ARPGX2  PROTIME-INR  APTT  DIFFERENTIAL   ____________________________________________  EKG  ED ECG REPORT I, 05/09/21, the attending physician, personally viewed and interpreted this ECG.  Date: 05/07/2021  Rhythm: normal sinus rhythm QRS Axis: normal Intervals: normal ST/T Wave abnormalities: normal Narrative Interpretation: no  evidence of acute ischemia  ____________________________________________  RADIOLOGY  CT head reviewed by me, no acute abnormality ____________________________________________   PROCEDURES  Procedure(s) performed: No  Procedures   Critical Care performed: No ____________________________________________   INITIAL IMPRESSION / ASSESSMENT AND PLAN / ED COURSE  Pertinent labs & imaging results that were available during my care of the patient were reviewed by me and considered in my medical decision making (see chart for details).   Patient with reported left-sided weakness 2 days ago, gradually improving suspicious for possible CVA/TIA.  Patient complains of diffuse weakness, afebrile no symptoms of viral illness at this time, pending CT head and labs.  CT head is reassuring.  However given high suspicion for  possible TIA versus CVA we will send for MR brain  MRI consistent with acute CVA, have discussed with Dr. Joylene Igo for admission    Stephan Minister: [phone]    ____________________________________________   FINAL CLINICAL IMPRESSION(S) / ED DIAGNOSES  Final diagnoses:  Cerebrovascular accident (CVA) due to embolism of cerebral artery [facility])        Note:  This document was prepared using Dragon voice recognition software and may include unintentional dictation errors.    Jene Every, MD 05/07/21 1149

## 2021-05-08 ENCOUNTER — Observation Stay
Admit: 2021-05-08 | Discharge: 2021-05-08 | Disposition: A | Discharge status: Home / Self Care | Payer: Medicare HMO | Attending: Internal Medicine | Admitting: Internal Medicine

## 2021-05-08 ENCOUNTER — Observation Stay: Payer: Medicare HMO

## 2021-05-08 DIAGNOSIS — I480 Paroxysmal atrial fibrillation: Secondary | ICD-10-CM

## 2021-05-08 DIAGNOSIS — F0390 Unspecified dementia without behavioral disturbance: Secondary | ICD-10-CM | POA: Diagnosis not present

## 2021-05-08 DIAGNOSIS — I6523 Occlusion and stenosis of bilateral carotid arteries: Secondary | ICD-10-CM | POA: Diagnosis not present

## 2021-05-08 DIAGNOSIS — F1010 Alcohol abuse, uncomplicated: Secondary | ICD-10-CM | POA: Diagnosis not present

## 2021-05-08 LAB — ECHOCARDIOGRAM COMPLETE
AR max vel: 2.43 cm2
AV Area VTI: 2.96 cm2
AV Area mean vel: 2.35 cm2
AV Mean grad: 3 mmHg
AV Peak grad: 5.2 mmHg
Ao pk vel: 1.14 m/s
Area-P 1/2: 3.31 cm2
Height: 67 in
S' Lateral: 2.3 cm
Weight: 2190.49 oz

## 2021-05-08 LAB — LIPID PANEL
Cholesterol: 142 mg/dL (ref 0–200)
HDL: 98 mg/dL (ref >40)
LDL Cholesterol: 34 mg/dL (ref 0–99)
Total CHOL/HDL Ratio: 1.4 RATIO
Triglycerides: 51 mg/dL (ref <150)
VLDL: 10 mg/dL (ref 0–40)

## 2021-05-08 MED ORDER — LOSARTAN POTASSIUM 25 MG PO TABS
25.0000 mg | ORAL_TABLET | Freq: Every day | ORAL | Status: DC
  Administered 2021-05-08 – 2021-05-11 (×4): 25 mg via ORAL
  Filled 2021-05-08 (×4): qty 1

## 2021-05-08 MED ORDER — MIRTAZAPINE 15 MG PO TABS: 30.0000 mg | ORAL_TABLET | Freq: Every day | ORAL | Status: DC

## 2021-05-08 MED ORDER — SPIRITUS FRUMENTI
4.0000 | Freq: Every day | ORAL | Status: DC | PRN
Start: 2021-05-08 — End: 2021-05-08
  Filled 2021-05-08: qty 6

## 2021-05-08 MED ORDER — METOPROLOL SUCCINATE ER 50 MG PO TB24
50.0000 mg | ORAL_TABLET | Freq: Every day | ORAL | Status: DC
  Administered 2021-05-08 – 2021-05-11 (×4): 50 mg via ORAL
  Filled 2021-05-08 (×4): qty 1

## 2021-05-08 MED ORDER — APIXABAN 5 MG PO TABS: 5.0000 mg | ORAL_TABLET | Freq: Two times a day (BID) | ORAL | Status: DC

## 2021-05-08 MED ORDER — LABETALOL HCL 5 MG/ML IV SOLN: 5.0000 mg | INTRAVENOUS | Status: DC | PRN

## 2021-05-08 MED ORDER — ASPIRIN 325 MG PO TABS
325.0000 mg | ORAL_TABLET | Freq: Once | ORAL | Status: AC
  Administered 2021-05-09: 15:00:00 325 mg via ORAL
  Filled 2021-05-08: qty 1

## 2021-05-08 MED ORDER — NICOTINE 14 MG/24HR TD PT24
14.0000 mg | MEDICATED_PATCH | Freq: Every day | TRANSDERMAL | Status: DC
  Administered 2021-05-08 – 2021-05-11 (×4): 14 mg via TRANSDERMAL
  Filled 2021-05-08 (×4): qty 1

## 2021-05-08 MED ORDER — HYDRALAZINE HCL 20 MG/ML IJ SOLN
10.0000 mg | INTRAMUSCULAR | Status: DC | PRN
  Administered 2021-05-09: 10 mg via INTRAVENOUS
  Filled 2021-05-08: qty 0.5
  Filled 2021-05-08: qty 1

## 2021-05-08 MED ORDER — NICOTINE POLACRILEX 2 MG MT GUM
2.0000 mg | CHEWING_GUM | OROMUCOSAL | Status: DC | PRN
  Filled 2021-05-08: qty 1

## 2021-05-08 MED ORDER — METOPROLOL TARTRATE 5 MG/5ML IV SOLN: 5.0000 mg | INTRAVENOUS | Status: DC | PRN

## 2021-05-08 MED ORDER — MIRTAZAPINE 15 MG PO TABS
30.0000 mg | ORAL_TABLET | Freq: Every day | ORAL | Status: DC
  Administered 2021-05-08: 22:00:00 30 mg via ORAL
  Filled 2021-05-08: qty 2

## 2021-05-08 MED ORDER — TRAZODONE HCL 50 MG PO TABS
100.0000 mg | ORAL_TABLET | Freq: Every day | ORAL | Status: DC
  Administered 2021-05-08: 100 mg via ORAL
  Filled 2021-05-08: qty 2

## 2021-05-08 MED ORDER — SPIRITUS FRUMENTI
1.0000 | Freq: Every day | ORAL | Status: DC | PRN
  Administered 2021-05-08: 21:00:00 1 via ORAL
  Filled 2021-05-08 (×2): qty 1

## 2021-05-08 NOTE — Progress Notes (Signed)
PROGRESS NOTE    Richard Carter  JEH:[id number] DOB: May 16, 1941 DOA: 05/07/2021 PCP: Marisue Ivan, MD   Brief Narrative:  80 y.o. male with medical history significant for paroxysmal atrial fibrillation not on anticoagulation due to history of medication noncompliance and increased risk of fall, historyof alcohol and nicotine dependence, history of vascular and Alzheimer's dementia who presents to the emergency room for evaluation of left arm weakness.  His neighbor states that his symptoms started between 4 and 6 PM, 2 days prior to his presentation to the ER.  He had seen him earlier in the day and patient was fine but when he went back to check on him at about 6 PM patient complained about weakness involving his left hand.  He was unable to pick up items with that hand but refused to come to the hospital to get checked.  He was finally able to convince him to come in on the day of admission. He denies having any chest pain, no shortness of breath, no headache, no difficulty swallowing, no dizziness, no lightheadedness, no fall, no blurred vision, no focal deficits, no fever, no chills, no urinary symptoms or changes in his bowel habits.   Assessment & Plan:   Principal Problem:   Acute CVA (cerebrovascular accident) (HCC) Active Problems:   Seizure (HCC)   HTN (hypertension)   Alcohol abuse   Non compliance w medication regimen   Dementia (HCC)   AF (paroxysmal atrial fibrillation) (HCC)  Acute CVA Likely embolic etiology.  Patient has a history of paroxysmal atrial fibrillation.  No anticoagulation due to history of medication nonadherence and increased fall likelihood. Plan: Continue aspirin 325 daily Intensity statin Follow-up 2D echocardiogram Speech, physical therapy, Occupational Therapy consults Will allow for permissive hypertension for now pending neurology recommendations.  Patient is likely out of the window where permissive hypertension would be of benefit.    History of nicotine dependence Smoking cessation has been discussed with patient in detail Continue NicoDerm patch   Seizure disorder Keppra Seizure precautions   Dementia Mixed (vascular and Alzheimer dementia) Continue Aricept, Lexapro and trazodone   History of alcohol abuse CIWA protocol with as needed Ativan Daily multivitamin, thiamine, folic acid  Hypertension Home regimen Cozaar 25 mg daily, Toprol-XL 50 mg daily BP agents on hold Can likely restart.  We will wait on formal neurology recommendations  History of paroxysmal atrial fibrillation Currently rate controlled sinus rhythm Continue telemetry monitoring Will need further discussion regarding risk/benefit of anticoagulation considering likely embolic etiology of recent CVA Restart metoprolol for rate control after neurology recs  DVT prophylaxis: SCDs Code Status: DNR Family Communication: Neighbor/caregiver at bedside.  Sister via phone Disposition Plan: Status is: Observation  The patient will require care spanning > 2 midnights and should be moved to inpatient because: Inpatient level of care appropriate due to severity of illness  Dispo: The patient is from: Home              Anticipated d/c is to:  Unknown at this time.  SNF versus home with home health              Patient currently is not medically stable to d/c.   Difficult to place patient No       Level of care: Med-Surg  Consultants:  Neurology  Procedures:  None  Antimicrobials:  None   Subjective: Patient seen and examined.  States he does not feel particularly well but unable to elaborate as to why.  Lying comfortably  in bed.  Visibly no distress  Objective: Vitals:   05/08/21 0007 05/08/21 0317 05/08/21 0837 05/08/21 1203  BP: (!) 177/86 (!) 149/87 (!) 140/112 (!) 163/89  Pulse: 62 63 (!) 59 83  Resp: Temp: 97.6 F (36.4 C) 97.6 F (36.4 C) 98.1 F (36.7 C) 97.7 F (36.5 C)  TempSrc:  Oral Oral Oral  SpO2:  93% 93% 96% 99%  Weight:      Height:        Intake/Output Summary (Last 24 hours) at 05/08/2021 1311 Last data filed at 05/08/2021 1030 Gross per 24 hour  Intake 240 ml  Output --  Net 240 ml   Filed Weights   05/07/21 0824  Weight: 62.1 kg    Examination:  General exam: No acute distress.  Appears frail Respiratory system: Poor respiratory effort.  Lungs overall clear.  Normal work of breathing.  Room air Cardiovascular system: S1-S2, regular rate and rhythm, no murmurs Gastrointestinal system: Abdomen is nondistended, soft and nontender. No organomegaly or masses felt. Normal bowel sounds heard. Central nervous system: Alert, oriented x2, no focal deficits Extremities: Diffusely decreased power.  Gait not assessed Skin: No rashes, lesions or ulcers Psychiatry: Judgement and insight appear impaired. Mood & affect flattened.     Data Reviewed: I have personally reviewed following labs and imaging studies  CBC: Recent Labs  Lab 05/07/21 0900  WBC 6.9  NEUTROABS 3.9  HGB 12.4*  HCT 35.7*  MCV 104.7*  PLT 183   Basic Metabolic Panel: Recent Labs  Lab 05/07/21 0900  NA 139  K 3.8  CL 104  CO2 27  GLUCOSE 109*  BUN 19  CREATININE 1.22  CALCIUM 9.9  MG 1.6*  PHOS 2.9   GFR: Estimated Creatinine Clearance: 41.7 mL/min (by C-G formula based on SCr of 1.22 mg/dL). Liver Function Tests: Recent Labs  Lab 05/07/21 0900  AST 54*  ALT 26  ALKPHOS 75  BILITOT 1.4*  PROT 7.0  ALBUMIN 3.8   No results for input(s): LIPASE, AMYLASE in the last 168 hours. No results for input(s): AMMONIA in the last 168 hours. Coagulation Profile: Recent Labs  Lab 05/07/21 0900  INR 0.9   Cardiac Enzymes: No results for input(s): CKTOTAL, CKMB, CKMBINDEX, TROPONINI in the last 168 hours. BNP (last 3 results) No results for input(s): PROBNP in the last 8760 hours. HbA1C: No results for input(s): HGBA1C in the last 72 hours. CBG: No results for input(s): GLUCAP in the  last 168 hours. Lipid Profile: Recent Labs    05/08/21 0513  CHOL 142  HDL 98  LDLCALC 34  TRIG 51  CHOLHDL 1.4   Thyroid Function Tests: No results for input(s): TSH, T4TOTAL, FREET4, T3FREE, THYROIDAB in the last 72 hours. Anemia Panel: No results for input(s): VITAMINB12, FOLATE, FERRITIN, TIBC, IRON, RETICCTPCT in the last 72 hours. Sepsis Labs: No results for input(s): PROCALCITON, LATICACIDVEN in the last 168 hours.  Recent Results (from the past 240 hour(s))  Resp Panel by RT-PCR (Flu A&B, Covid) Nasopharyngeal Swab     Status: None   Collection Time: 05/07/21 10:50 AM   Specimen: Nasopharyngeal Swab; Nasopharyngeal(NP) swabs in vial transport medium  Result Value Ref Range Status   SARS Coronavirus 2 by RT PCR NEGATIVE NEGATIVE Final    Comment: (NOTE) SARS-CoV-2 target nucleic acids are NOT DETECTED.  The SARS-CoV-2 RNA is generally detectable in upper respiratory specimens during the acute phase of infection. The lowest concentration of SARS-CoV-2 viral copies  this assay can detect is 138 copies/mL. A negative result does not preclude SARS-Cov-2 infection and should not be used as the sole basis for treatment or other patient management decisions. A negative result may occur with  improper specimen collection/handling, submission of specimen other than nasopharyngeal swab, presence of viral mutation(s) within the areas targeted by this assay, and inadequate number of viral copies(<138 copies/mL). A negative result must be combined with clinical observations, patient history, and epidemiological information. The expected result is Negative.  Fact Sheet for Patients:  BloggerCourse.com  Fact Sheet for Healthcare Providers:  SeriousBroker.it  This test is no t yet approved or cleared by the [place] FDA and  has been authorized for detection and/or diagnosis of SARS-CoV-2 by FDA under an Emergency Use  Authorization (EUA). This EUA will remain  in effect (meaning this test can be used) for the duration of the COVID-19 declaration under Section 564(b)(1) of the Act, 21 U.S.C.section 360bbb-3(b)(1), unless the authorization is terminated  or revoked sooner.       Influenza A by PCR NEGATIVE NEGATIVE Final   Influenza B by PCR NEGATIVE NEGATIVE Final    Comment: (NOTE) The Xpert Xpress SARS-CoV-2/FLU/RSV plus assay is intended as an aid in the diagnosis of influenza from Nasopharyngeal swab specimens and should not be used as a sole basis for treatment. Nasal washings and aspirates are unacceptable for Xpert Xpress SARS-CoV-2/FLU/RSV testing.  Fact Sheet for Patients: BloggerCourse.com  Fact Sheet for Healthcare Providers: SeriousBroker.it  This test is not yet approved or cleared by the [place] FDA and has been authorized for detection and/or diagnosis of SARS-CoV-2 by FDA under an Emergency Use Authorization (EUA). This EUA will remain in effect (meaning this test can be used) for the duration of the COVID-19 declaration under Section 564(b)(1) of the Act, 21 U.S.C. section 360bbb-3(b)(1), unless the authorization is terminated or revoked.  Performed at [facility], [address] [place], [place] [address]          Radiology Studies: CT HEAD WO CONTRAST  Result Date: 05/07/2021 CLINICAL DATA:  Left arm weakness since Saturday EXAM: CT HEAD WITHOUT CONTRAST TECHNIQUE: Contiguous axial images were obtained from the base of the skull through the vertex without intravenous contrast. COMPARISON:  02/22/2020 FINDINGS: Brain: No evidence of acute infarction, hemorrhage, hydrocephalus, extra-axial collection or mass lesion/mass effect. Generalized atrophy. Overall mild for age chronic small vessel ischemia in the cerebral white matter including a chronic lacune at the left corona radiata. Possible chronic lacune  at the right pons. Vascular: Atheromatous calcification. Skull: Negative Sinuses/Orbits: Negative IMPRESSION: 1. No acute finding. 2. Generalized atrophy with chronic small vessel ischemia. Electronically Signed   By: Marnee Spring M.D.   On: 05/07/2021 09:22   MR ANGIO HEAD WO CONTRAST  Result Date: 05/07/2021 CLINICAL DATA:  Acute cerebral and cerebellar infarcts on MRI. EXAM: MRA HEAD WITHOUT CONTRAST TECHNIQUE: Angiographic images of the Circle of Willis were acquired using MRA technique without intravenous contrast. COMPARISON:  No pertinent prior exam. FINDINGS: Anterior circulation: The internal carotid arteries are patent from skull base to carotid termini with moderate stenosis in the region of the anterior genu, right worse than left. ACAs and MCAs are patent without evidence of a proximal branch occlusion or significant A1 or M1 stenosis. There is moderate irregular narrowing of more distal branch vessels. No aneurysm is identified. Posterior circulation: The visualized distal vertebral arteries are patent with the left being strongly dominant. Patent right PICA, left AICA, and  bilateral SCA origins are visualized. Basilar artery is widely patent. There is a small left posterior communicating artery. Both PCAs are patent with branch vessel attenuation but no evidence of a significant proximal stenosis. No aneurysm is identified. Anatomic variants: Strongly dominant left vertebral artery. Other: None. IMPRESSION: 1. No large vessel occlusion. 2. Atherosclerosis including moderate bilateral ICA stenoses. Electronically Signed   By: Sebastian AcheAllen  Grady M.D.   On: 05/07/2021 17:46   MR BRAIN WO CONTRAST  Result Date: 05/07/2021 CLINICAL DATA:  TIA, left arm weakness EXAM: MRI HEAD WITHOUT CONTRAST TECHNIQUE: Multiplanar, multiecho pulse sequences of the brain and surrounding structures were obtained without intravenous contrast. COMPARISON:  02/23/2020 FINDINGS: Brain: Small areas of cortical restricted  diffusion are present in the right frontoparietal lobes with notable involvement of the precentral gyrus in the hand motor region. Subtle corresponding loss of gray-white differentiation is noted on the prior CT. Additional small foci of reduced diffusion are present in the right posterior lentiform nucleus, left hippocampus, and left cerebellum. Few scattered foci of susceptibility in the cerebral subcortical white matter, left thalamus, and left cerebellum compatible with chronic microhemorrhages. Patchy and confluent areas of T2 hyperintensity in the supratentorial white matter are nonspecific but probably reflect mild to moderate chronic microvascular ischemic changes similar to the prior study. Chronic cortical/subcortical infarcts of the left frontal lobe. Small chronic infarcts of bilateral basal ganglia and corona radiata and right pons. Prominence of the ventricles and sulci reflects generalized parenchymal volume loss similar to the prior study. There is no intracranial mass or mass effect. No hydrocephalus or extra-axial collection. Vascular: Major vessel flow voids at the skull base are preserved. Skull and upper cervical spine: Normal marrow signal is preserved. Sinuses/Orbits: Paranasal sinuses are aerated. Orbits are unremarkable. Other: Sella is unremarkable.  Mastoid air cells are clear. IMPRESSION: Small acute infarcts in the right frontoparietal lobes, right posterior lentiform nucleus, left hippocampus, and left cerebellum. There is notable involvement of the right hand motor region. Chronic infarcts and chronic microvascular ischemic changes as seen previously. Few chronic microhemorrhages secondary to hypertension and/or amyloid angiopathy. Electronically Signed   By: Guadlupe SpanishPraneil  Patel M.D.   On: 05/07/2021 10:48   US Carotid Bilateral  Result Date: 05/08/2021 CLINICAL DATA:  Stroke-like symptoms EXAM: BILATERAL CAROTID DUPLEX ULTRASOUND TECHNIQUE: Wallace CullensGray scale imaging, color Doppler and duplex  ultrasound were performed of bilateral carotid and vertebral arteries in the neck. COMPARISON:  None. FINDINGS: Criteria: Quantification of carotid stenosis is based on velocity parameters that correlate the residual internal carotid diameter with NASCET-based stenosis levels, using the diameter of the distal internal carotid lumen as the denominator for stenosis measurement. The following velocity measurements were obtained: RIGHT ICA: 59/7 cm/sec CCA: 58/11 cm/sec SYSTOLIC ICA/CCA RATIO:  1.0 ECA:  103 cm/sec LEFT ICA: 111/14 cm/sec CCA: 62/10 cm/sec SYSTOLIC ICA/CCA RATIO:  1.8 ECA:  156 cm/sec RIGHT CAROTID ARTERY: Extensively irregular/ulcerated heterogeneous atherosclerotic plaque throughout the common carotid artery extending into the proximal internal carotid artery. By peak systolic velocity criteria, the estimated stenosis is less than 50%. RIGHT VERTEBRAL ARTERY:  Patent with antegrade flow. LEFT CAROTID ARTERY: Irregular/ulcerated heterogeneous atherosclerotic plaque in the distal common carotid artery and proximal internal carotid artery. By peak systolic velocity criteria, the estimated stenosis is less than 50%. LEFT VERTEBRAL ARTERY:  Patent with antegrade flow. IMPRESSION: 1. Mild (1-49%) stenosis proximal right internal carotid artery secondary to irregular/ulcerated heterogeneous atherosclerotic plaque. 2. Mild (1-49%) stenosis proximal left internal carotid artery secondary to irregular/ulcerated heterogeneous atherosclerotic plaque.  3. Vertebral arteries are patent with normal antegrade flow. Signed, Sterling Big, MD, RPVI [facility] Radiology Electronically Signed   By: Malachy Moan M.D.   On: 05/08/2021 08:18   DG Chest Port 1 View  Result Date: 05/07/2021 CLINICAL DATA:  Left-sided weakness EXAM: PORTABLE CHEST 1 VIEW COMPARISON:  11/29/2020 FINDINGS: Decreased lung volume with mild bibasilar atelectasis. No acute infiltrate or  edema. No pleural effusion. Chronic left rib fractures. IMPRESSION: No active disease. Electronically Signed   By: Marlan Palau M.D.   On: 05/07/2021 09:03   ECHOCARDIOGRAM COMPLETE  Result Date: 05/08/2021    ECHOCARDIOGRAM REPORT   Patient Name:   JOELL USMAN Date of Exam: 05/08/2021 Medical Rec #:  [id number]        Height:       67.0 in Accession #:    [id number]       Weight:       136.9 lb Date of Birth:  [birth year]         BSA:          1.721 m Patient Age:    79 years         BP:           149/87 mmHg Patient Gender: M                HR:           63 bpm. Exam Location:  [facility] Procedure: 2D Echo, Cardiac Doppler and Color Doppler Indications:     Stroke I63.9  History:         Patient has no prior history of Echocardiogram examinations.                  Risk Factors:Hypertension.  Sonographer:     Cristela Blue RDCS (AE) Referring Phys:  [id number] Lucile Shutters Diagnosing Phys: Harold Hedge MD  Sonographer Comments: Suboptimal parasternal window. IMPRESSIONS  1. Left ventricular ejection fraction, by estimation, is 55 to 60%. The left ventricle has normal function. The left ventricle has no regional wall motion abnormalities. Left ventricular diastolic parameters were normal.  2. Right ventricular systolic function is normal. The right ventricular size is normal.  3. The mitral valve is grossly normal. No evidence of mitral valve regurgitation.  4. The aortic valve was not well visualized. Aortic valve regurgitation is not visualized. FINDINGS  Left Ventricle: Left ventricular ejection fraction, by estimation, is 55 to 60%. The left ventricle has normal function. The left ventricle has no regional wall motion abnormalities. The left ventricular internal cavity size was normal in size. There is  no left ventricular hypertrophy. Left ventricular diastolic parameters were normal. Right Ventricle: The right ventricular size is normal. No increase in right ventricular wall thickness. Right ventricular systolic  function is normal. Left Atrium: Left atrial size was normal in size. Right Atrium: Right atrial size was normal in size. Pericardium: There is no evidence of pericardial effusion. Mitral Valve: The mitral valve is grossly normal. No evidence of mitral valve regurgitation. Tricuspid Valve: The tricuspid valve is grossly normal. Tricuspid valve regurgitation is trivial. Aortic Valve: The aortic valve was not well visualized. Aortic valve regurgitation is not visualized. Aortic valve mean gradient measures 3.0 mmHg. Aortic valve peak gradient measures 5.2 mmHg. Aortic valve area, by VTI measures 2.96 cm. Pulmonic Valve: The pulmonic valve was not well visualized. Pulmonic valve regurgitation is not visualized. Aorta: The aortic root is normal in size and structure.  IAS/Shunts: The atrial septum is grossly normal.  LEFT VENTRICLE PLAX 2D LVIDd:         3.30 cm  Diastology LVIDs:         2.30 cm  LV e' medial:    4.90 cm/s LV PW:         1.20 cm  LV E/e' medial:  15.8 LV IVS:        1.00 cm  LV e' lateral:   7.62 cm/s LVOT diam:     2.00 cm  LV E/e' lateral: 10.2 LV SV:         68 LV SV Index:   40 LVOT Area:     3.14 cm  RIGHT VENTRICLE RV Basal diam:  2.20 cm RV S prime:     10.20 cm/s TAPSE (M-mode): 4.1 cm LEFT ATRIUM             Index       RIGHT ATRIUM           Index LA diam:        3.30 cm 1.92 cm/m  RA Area:     13.70 cm LA Vol (A2C):   54.4 ml 31.60 ml/m RA Volume:   28.40 ml  16.50 ml/m LA Vol (A4C):   40.7 ml 23.64 ml/m LA Biplane Vol: 47.4 ml 27.54 ml/m  AORTIC VALVE                   PULMONIC VALVE AV Area (Vmax):    2.43 cm    PV Vmax:        0.54 m/s AV Area (Vmean):   2.35 cm    PV Peak grad:   1.2 mmHg AV Area (VTI):     2.96 cm    RVOT Peak grad: 2 mmHg AV Vmax:           114.05 cm/s AV Vmean:          75.550 cm/s AV VTI:            0.231 m AV Peak Grad:      5.2 mmHg AV Mean Grad:      3.0 mmHg LVOT Vmax:         88.10 cm/s LVOT Vmean:        56.500 cm/s LVOT VTI:          0.218 m LVOT/AV  VTI ratio: 0.94  AORTA Ao Root diam: 3.40 cm MITRAL VALVE                TRICUSPID VALVE MV Area (PHT): 3.31 cm     TR Peak grad:   9.2 mmHg MV Decel Time: 229 msec     TR Vmax:        152.00 cm/s MV E velocity: 77.60 cm/s MV A velocity: 102.00 cm/s  SHUNTS MV E/A ratio:  0.76         Systemic VTI:  0.22 m                             Systemic Diam: 2.00 cm Harold Hedge MD Electronically signed by Harold Hedge MD Signature Date/Time: 05/08/2021/10:41:55 AM    Final         Scheduled Meds:  aspirin  300 mg Rectal Daily   Or   aspirin  325 mg Oral Daily   atorvastatin  80 mg Oral QHS   folic acid  1 mg Oral Daily  levETIRAcetam  500 mg Oral BID   LORazepam  0-4 mg Oral Q6H   Followed by   Melene Muller ON 05/09/2021] LORazepam  0-4 mg Oral Q12H   magnesium oxide  400 mg Oral Daily   multivitamin with minerals  1 tablet Oral Daily   nicotine  14 mg Transdermal Daily   pantoprazole  40 mg Oral Daily   sodium chloride flush  3 mL Intravenous Once   thiamine  100 mg Oral Daily   Or   thiamine  100 mg Intravenous Daily   traZODone  200 mg Oral QHS   Continuous Infusions:   LOS: 0 days    Time spent: 25 minutes    Tresa Moore, MD [facility] Pager [phone]  If 7PM-7AM, please contact night-coverage 05/08/2021, 1:11 PM

## 2021-05-08 NOTE — Evaluation (Signed)
Physical Therapy Evaluation Patient Details Name: Richard Carter MRN: [id number] DOB: [birth year] Today's Date: 05/08/2021   History of Present Illness  Richard Carter is an 80 y.o. male with PMH including paroxysmal atrial fibrillation (not on anticoagulation due to history of medication noncompliance and increased risk of fall), historyof alcohol and nicotine dependence, history of vascular and Alzheimer's dementia who presents to the emergency room for evaluation of left arm weakness.  MRI significant for small acute infarcts in the right frontal parietal lobes, right posterior lentiform nucleus, left hippocampus and left cerebellum.  He will be admitted to the hospital for further evaluation.  Clinical Impression  Patient sleeping soundly upon arrival to room; easily awakens to voice and light touch.  Oriented to self, location and general situation; follows simple commands, pleasant and cooperative.  However, noted deficits in insight, safety awareness and overall ability/willingness to integrate new information.  Mild/mod weakness to L UE > L LE with associated deficits in coordination and motor planning.  Denies sensory deficit, but does require visual confirmation of L UE function/task.  Currently able to complete bed mobility with sup; sit/stand, basic transfers and gait (50') with RW, min assist.  Demonstrates forward flexed posture, maintains RW arms-length ahead of patient.  Staggering gait performance with inconsistent step height/length/foot placement.  Limited balance reactions evident. Very quick to release grasp of RW, prefering to hold furniture/walls instead; limited success with redirecting or education on additional safety needs.  Will continue to reinforce throughout. Would benefit from skilled PT to address above deficits and promote optimal return to PLOF.;recommend transition to STR upon discharge from acute hospitalization.     Follow Up Recommendations SNF    Equipment  Recommendations  Rolling walker with 5" wheels    Recommendations for Other Services       Precautions / Restrictions Precautions Precautions: Fall Restrictions Weight Bearing Restrictions: No      Mobility  Bed Mobility Overal bed mobility: Needs Assistance Bed Mobility: Supine to Sit     Supine to sit: Supervision     General bed mobility comments: increased time to coordinate task    Transfers Overall transfer level: Needs assistance   Transfers: Sit to/from Stand Sit to Stand: Min assist         General transfer comment: performed with and without RW, min assist for all repetitions.  Does require UE support throughout to optimize safety.  Limited carry-over of cuing  Ambulation/Gait Ambulation/Gait assistance: Min assist Gait Distance (Feet): 50 Feet Assistive device: Rolling walker (2 wheeled)       General Gait Details: forward flexed posture, maintains RW arms-length ahead of patient.  Staggering gait performance with inconsistent step height/length/foot placement.  Limited balance reactions evident. Very quick to release grasp of RW, prefering to hold furniture/walls instead; limited success with redirecting or education on additional safety needs.  Stairs            Wheelchair Mobility    Modified Rankin (Stroke Patients Only)       Balance Overall balance assessment: Needs assistance Sitting-balance support: No upper extremity supported;Feet supported Sitting balance-Leahy Scale: Fair     Standing balance support: Single extremity supported;Bilateral upper extremity supported Standing balance-Leahy Scale: Poor                               Pertinent Vitals/Pain Pain Assessment: No/denies pain    Home Living Family/patient expects to be discharged to:: Private  residence Living Arrangements: Alone Available Help at Discharge: Neighbor Type of Home: Mobile home Home Access: Stairs to enter   Entrance Stairs-Number of  Steps: 2 Home Layout: One level Home Equipment: Cane - single point;Shower seat - built in;Wheelchair - manual;Grab bars - tub/shower;Grab bars - toilet      Prior Function Level of Independence: Needs assistance      ADL's / Homemaking Assistance Needed: Pt performs BADLs independently, receives assistance from neighbor for all IADLs (medications, driving, cleaning, yardwork, cooking)  Comments: Sup/mod indep for limited household ambulation, tends to 'furniture cruise' within home environment ("I'm always holding to something"), very limited use of SPC for distances of any length.  Denies fall history.  Enjoys watching television during the day.     Hand Dominance   Dominant Hand: Right    Extremity/Trunk Assessment   Upper Extremity Assessment Upper Extremity Assessment:  (R shoulder elevation limited to approx 80 degrees due to baseline RTC injury, otherwise grossly WFL and 4+/5; L UE grossly 4-/5 proximally, 3-/5 distally.  Limited motor control/coordination L hand.  Denies sensory deficit)    Lower Extremity Assessment Lower Extremity Assessment:  (R LE grossly 4/5 throughout, L LE grossly 4-/5.  Mild coordination deficit L LE.  Denies sensory deficit)       Communication   Communication:  (intermittent stuttering)  Cognition Arousal/Alertness: Awake/alert Behavior During Therapy: WFL for tasks assessed/performed Overall Cognitive Status: No family/caregiver present to determine baseline cognitive functioning                                 General Comments: Oriented to self, location and general situation; follows simple commands.  Limited insight into deficits and overall safety needs.      General Comments      Exercises Other Exercises Other Exercises: Educated in role of PT and progressive mobility, safety with transfers and gait, RW management; patient voiced understanding, but with limited carry-over and integration.   Assessment/Plan    PT  Assessment Patient needs continued PT services  PT Problem List Decreased strength;Decreased range of motion;Decreased activity tolerance;Decreased balance;Decreased mobility;Decreased coordination;Decreased cognition;Decreased knowledge of use of DME;Decreased safety awareness;Decreased knowledge of precautions       PT Treatment Interventions DME instruction;Gait training;Stair training;Functional mobility training;Therapeutic activities;Therapeutic exercise;Balance training;Patient/family education;Neuromuscular re-education;Cognitive remediation    PT Goals (Current goals can be found in the Care Plan section)  Acute Rehab PT Goals Patient Stated Goal: to get some rest PT Goal Formulation: With patient Time For Goal Achievement: 05/22/21 Potential to Achieve Goals: Fair    Frequency 7X/week   Barriers to discharge        Co-evaluation               AM-PAC PT "6 Clicks" Mobility  Outcome Measure Help needed turning from your back to your side while in a flat bed without using bedrails?: None Help needed moving from lying on your back to sitting on the side of a flat bed without using bedrails?: A Little Help needed moving to and from a bed to a chair (including a wheelchair)?: A Little Help needed standing up from a chair using your arms (e.g., wheelchair or bedside chair)?: A Little Help needed to walk in hospital room?: A Little Help needed climbing 3-5 steps with a railing? : A Lot 6 Click Score: 18    End of Session Equipment Utilized During Treatment: Gait belt Activity Tolerance: Patient  tolerated treatment well Patient left: in chair;with chair alarm set;with call bell/phone within reach Nurse Communication: Mobility status PT Visit Diagnosis: Muscle weakness (generalized) (M62.81);Difficulty in walking, not elsewhere classified (R26.2);Hemiplegia and hemiparesis Hemiplegia - Right/Left: Left Hemiplegia - dominant/non-dominant: Non-dominant Hemiplegia - caused  by: Cerebral infarction    Time: 1143-1206 PT Time Calculation (min) (ACUTE ONLY): 23 min   Charges:   PT Evaluation $PT Eval Moderate Complexity: 1 Mod PT Treatments $Therapeutic Activity: 8-22 mins        Meliyah Simon H. Manson Passey, PT, DPT, NCS 05/08/21, 4:10 PM [phone]

## 2021-05-08 NOTE — Progress Notes (Signed)
Acute cerebral and cerebellar infarcts on MRI.  Ongoing permissive HTN.  Treat BP if SBP>220 or DBP>120 for 24 hours, then manage BP towards goal of 130 systolic over the longer term.  Added IV Labetalol PRN with the above parameters.

## 2021-05-08 NOTE — TOC Initial Note (Signed)
Transition of Care [facility]) - Initial/Assessment Note    Patient Details  Name: Richard Carter MRN: [id number] Date of Birth: 11-30-1940  [facility]) CM/SW Contact:    Shelbie Hutching, RN Phone Number: 05/08/2021, 10:49 AM  Clinical Narrative:                 Patient placed under observation for stroke.  RNCM met with patient at the bedside this morning.  Patient reports that he lives at home alone and is independent and drives.  Patient reports that he has a neighbor that looks after him.  Patient endorses tobacco and alcohol use.  Smokes about 1 and 1/2 packs per day and drinks a fifth of liquor a couple of days a week.   Patient agrees to SNF for short term rehab if recommended by PT and OT.     Expected Discharge Plan: Skilled Nursing Facility Barriers to Discharge: Continued Medical Work up   Patient Goals and CMS Choice Patient states their goals for this hospitalization and ongoing recovery are:: Patient agrees to SNF if recommended CMS Medicare.gov Compare Post Acute Care list provided to:: Patient Choice offered to / list presented to : Patient  Expected Discharge Plan and Services Expected Discharge Plan: [place]   Discharge Planning Services: CM Consult Post Acute Care Choice: [place] Living arrangements for the past 2 months: Mobile Home                 DME Arranged: N/A DME Agency: NA                  Prior Living Arrangements/Services Living arrangements for the past 2 months: Mobile Home Lives with:: Self Patient language and need for interpreter reviewed:: Yes Do you feel safe going back to the place where you live?: Yes      Need for Family Participation in Patient Care: Yes (Comment) (stroke) Care giver support system in place?: Yes (comment) (neighbor helps him out)   Criminal Activity/Legal Involvement Pertinent to Current Situation/Hospitalization: No - Comment as needed  Activities of Daily  Living Home Assistive Devices/Equipment: Cane (specify quad or straight), Walker (specify type) ADL Screening (condition at time of admission) Patient's cognitive ability adequate to safely complete daily activities?: Yes Is the patient deaf or have difficulty hearing?: No Does the patient have difficulty seeing, even when wearing glasses/contacts?: No Does the patient have difficulty concentrating, remembering, or making decisions?: No Patient able to express need for assistance with ADLs?: Yes Does the patient have difficulty dressing or bathing?: No Independently performs ADLs?: Yes (appropriate for developmental age) Does the patient have difficulty walking or climbing stairs?: Yes Weakness of Legs: Both Weakness of Arms/Hands: None  Permission Sought/Granted Permission sought to share information with : Case Manager Permission granted to share information with : Yes, Verbal Permission Granted     Permission granted to share info w AGENCY: SNFs        Emotional Assessment Appearance:: Appears stated age Attitude/Demeanor/Rapport: Engaged Affect (typically observed): Accepting Orientation: : Oriented to Self, Oriented to Place, Oriented to  Time, Oriented to Situation Alcohol / Substance Use: Alcohol Use, Tobacco Use Psych Involvement: No (comment)  Admission diagnosis:  Stroke (cerebrum) (HCC) [I63.9] Acute CVA (cerebrovascular accident) [facility]) [I63.9] Cerebrovascular accident (CVA) due to embolism of cerebral artery ([place]) [I63.40] Patient Active Problem List   Diagnosis Date Noted   Acute CVA (cerebrovascular accident) ([place]) 05/07/2021   Non compliance w medication regimen 05/07/2021  Dementia ([place]) 05/07/2021   AF (paroxysmal atrial fibrillation) ([place]) 05/07/2021   HTN (hypertension) 11/29/2020   Alcohol abuse 11/29/2020   AKI (acute kidney injury) ([place]) 11/29/2020   Atrial flutter with rapid ventricular response ([place]) 11/29/2020   Seizure ([place]) 02/22/2020   Pubic  ramus fracture ([place]) 07/29/2019   Acetabular fracture ([place]) 07/29/2019   HLD (hyperlipidemia) 07/29/2019   GERD (gastroesophageal reflux disease) 07/29/2019   UTI (urinary tract infection) 07/29/2019   PCP:  Dion Body, MD Pharmacy:   [facility] [address]), [place] [address] [place]) [place] [address] Phone: [phone] Fax: [phone]     Social Determinants of Health (SDOH) Interventions    Readmission Risk Interventions No flowsheet data found.

## 2021-05-08 NOTE — Evaluation (Signed)
Clinical/Bedside Swallow Evaluation Patient Details  Name: Richard Carter MRN: [id number] Date of Birth: 1941/07/23  Today's Date: 05/08/2021 Time: SLP Start Time (ACUTE ONLY): 1240 SLP Stop Time (ACUTE ONLY): 1326 SLP Time Calculation (min) (ACUTE ONLY): 46.43 min  Past Medical History:  Past Medical History:  Diagnosis Date   Alcohol abuse    Dementia (HCC)    GERD (gastroesophageal reflux disease)    HLD (hyperlipidemia)    HTN (hypertension)    Past Surgical History:  Past Surgical History:  Procedure Laterality Date   skin cyst removal     HPI:  Per admitting H&P "  Richard Carter is a 80 y.o. male with medical history significant for paroxysmal atrial fibrillation not on anticoagulation due to history of medication noncompliance and increased risk of fall, historyof alcohol and nicotine dependence, history of vascular and Alzheimer's dementia who presents to the emergency room for evaluation of left arm weakness.  His neighbor states that his symptoms started between 4 and 6 PM, 2 days prior to his presentation to the ER.  He had seen him earlier in the day and patient was fine but when he went back to check on him at about 6 PM patient complained about weakness involving his left hand.  He was unable to pick up items with that hand but refused to come to the hospital to get checked.  He was finally able to convince him to come in on the day of admission.  He denies having any chest pain, no shortness of breath, no headache, no difficulty swallowing, no dizziness, no lightheadedness, no fall, no blurred vision, no focal deficits, no fever, no chills, no urinary symptoms or changes in his bowel habits.  Labs show sodium 139, potassium 3.8, chloride 104, bicarb 27, glucose 109, BUN 19, creatinine 1.2, calcium 9.9, alkaline phosphatase 75, albumin 3.8, AST 54, ALT 26, total protein 7.0, white count 6.9, hemoglobin 12.4, hematocrit 35.7, MCV 104.7, RDW 18.8, platelet count 183  Respiratory  viral panel is negative  Chest x-ray reviewed by me shows no evidence of acute cardiopulmonary disease.  CT scan of the head without contrast shows no acute findings.  Generalized atrophy with chronic small vessel ischemia.  MRI of the brain without contrast showed Small acute infarcts in the right frontoparietal lobes, right posterior lentiform nucleus, left hippocampus, and left cerebellum. There is notable involvement of the right hand motor region. Chronic infarcts and chronic microvascular ischemic changes as seen  previously. Few chronic microhemorrhages secondary to hypertension and/or amyloid angiopathy.  Twelve-lead EKG reviewed by me shows sinus rhythm with borderline left axis deviation."   Assessment / Plan / Recommendation Clinical Impression  Pt. presents with mild Dysphagia but no overt s/s of aspiration with any tested consistency today. Oral phase was adequate for liquids and purees but Pt needed extended time to chew and clear solids. He would often take several more bites before clearing the food that was in his mouth. Moderate oral residue between bites. Pt had a wet vocal quality before and after PO's inconsistently. Rec Dys 3 diet to avoid anything hard. Dentures were loosley fitted and noted to move around as Pt ate and spoke. Pt has dementia at basline but was ablle to converse with ST using appropriate and clear speech without evidence of Aphasia or Dysarthria. prognosis good. SLP Visit Diagnosis: Dysphagia, oropharyngeal phase (R13.12)    Aspiration Risk  Mild aspiration risk    Diet Recommendation Dysphagia 3 (Mech soft)   Liquid  Administration via: Straw;Cup Medication Administration: Whole meds with liquid Supervision: Patient able to self feed Compensations: Minimize environmental distractions;Slow rate;Small sips/bites Postural Changes: Seated upright at 90 degrees;Remain upright for at least 30 minutes after po intake    Other  Recommendations Oral Care  Recommendations: Oral care BID   Follow up Recommendations        Frequency and Duration min 1 x/week  1 week       Prognosis        Swallow Study   General Date of Onset: 05/07/21 HPI: Per admitting H&P "  [name] is a 80 y.o. male with medical history significant for paroxysmal atrial fibrillation not on anticoagulation due to history of medication noncompliance and increased risk of fall, historyof alcohol and nicotine dependence, history of vascular and Alzheimer's dementia who presents to the emergency room for evaluation of left arm weakness.  His neighbor states that his symptoms started between 4 and 6 PM, 2 days prior to his presentation to the ER.  He had seen him earlier in the day and patient was fine but when he went back to check on him at about 6 PM patient complained about weakness involving his left hand.  He was unable to pick up items with that hand but refused to come to the hospital to get checked.  He was finally able to convince him to come in on the day of admission.  He denies having any chest pain, no shortness of breath, no headache, no difficulty swallowing, no dizziness, no lightheadedness, no fall, no blurred vision, no focal deficits, no fever, no chills, no urinary symptoms or changes in his bowel habits.  Labs show sodium 139, potassium 3.8, chloride 104, bicarb 27, glucose 109, BUN 19, creatinine 1.2, calcium 9.9, alkaline phosphatase 75, albumin 3.8, AST 54, ALT 26, total protein 7.0, white count 6.9, hemoglobin 12.4, hematocrit 35.7, MCV 104.7, RDW 18.8, platelet count 183  Respiratory viral panel is negative  Chest x-ray reviewed by me shows no evidence of acute cardiopulmonary disease.  CT scan of the head without contrast shows no acute findings.  Generalized atrophy with chronic small vessel ischemia.  MRI of the brain without contrast showed Small acute infarcts in the right frontoparietal lobes, right posterior lentiform nucleus, left hippocampus,  and left cerebellum. There is notable involvement of the right hand motor region. Chronic infarcts and chronic microvascular ischemic changes as seen  previously. Few chronic microhemorrhages secondary to hypertension and/or amyloid angiopathy.  Twelve-lead EKG reviewed by me shows sinus rhythm with borderline left axis deviation." Type of Study: Bedside Swallow Evaluation Diet Prior to this Study: Regular Temperature Spikes Noted: No Respiratory Status: Room air History of Recent Intubation: No Behavior/Cognition: Alert;Cooperative;Pleasant mood Oral Cavity Assessment: Within Functional Limits Oral Care Completed by SLP: No Oral Cavity - Dentition: Dentures, top;Dentures, bottom Vision: Functional for self-feeding Self-Feeding Abilities: Able to feed self Patient Positioning: Upright in bed Baseline Vocal Quality: Hoarse;Wet    Oral/Motor/Sensory Function Overall Oral Motor/Sensory Function: Within functional limits   Ice Chips Ice chips: Not tested   Thin Liquid Thin Liquid: Within functional limits Presentation: Cup;Straw    Nectar Thick Nectar Thick Liquid: Not tested   Honey Thick Honey Thick Liquid: Not tested   Puree Puree: Within functional limits Presentation: Self Fed   Solid     Solid: Impaired Presentation: Self Fed Oral Phase Impairments: Impaired mastication Oral Phase Functional Implications: Prolonged oral transit;Oral residue      Eather Colas  05/08/2021,1:27 PM

## 2021-05-08 NOTE — Care Management Obs Status (Signed)
MEDICARE OBSERVATION STATUS NOTIFICATION   Patient Details  Name: Richard Carter MRN: [id number] Date of Birth: 16-Oct-1941   Medicare Observation Status Notification Given:  Yes    Allayne Butcher, RN 05/08/2021, 10:29 AM

## 2021-05-08 NOTE — Consult Note (Signed)
Neurology Consultation Reason for Consult: Strokes on MRI Requesting Physician: Tochukwu Agbata  CC: left hand weakness   History is obtained from: Patient, caregiver and PoA son Richard Carter  HPI: Richard Carter is a 80 y.o. 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  Per chart review, he had symptom onset on 05/05/2021 sometime between 4 and 6 PM and initially refused presentation to the ED.  He was finally convinced to come in on 05/07/2021 at which time MRI brain demonstrated acute infarcts as detailed below and neurology was consulted for further recommendations  Regarding his seizure history, he was seen on 02/23/2020 for an episode of clenching of the right fist, eyes rolling back followed by unresponsiveness, with a second stereotyped event after which EMS was activated.  EEG obtained that day was negative and MRI brain with and without contrast showed chronic small vessel disease and lacunar infarcts in the bilateral basal ganglia, left prefrontal gyrus and right pons without any abnormal contrast-enhancement or clear seizure focus.  Ethanol level on presentation was 31.  He was started on Keppra 500 mg twice daily which he was tolerating well during his hospitalization.  He follows with Dr. Sherryll Burger and his PA Ms. Alvira Philips, last seen on 01/03/2021 at which time his neurological examination was largely unremarkable including documentation that he was oriented to time/place/person with appropriate attention span and concentration as well as language.  An additional 30 minutes were spent on the phone with caregiver Richard Carter as well as son Richard Carter.  Caregiver denies that the patient is driving and notes that he is continent and makes it to the bathroom  by himself but does get assistance washing his back and his hair from [place] or his other son Churchill Grimsley.  Richard Carter denies that the patient has frequent falls and reports that he last tripped about a month ago over the household cat.  He also notes that the patient is compliant with medications but has trouble swallowing large pills in particular his KLOR.  He does well with smaller pills especially if they can be crushed with applesauce.  Richard Carter does feel like the patient would benefit from using a walker but the patient is unwilling to use it.  He has been cutting back on the patient's drinking by diluting alcohol with ginger ale the patient is currently down to approximately 1.5 shots daily, and does continue to smoke 1 pack/day.  Richard Carter also helps the patient with all of his meals  LKW: Saturday 6/11 at 4 PM Premorbid modified rankin scale: 2-3     2 - Slight disability. Able to look after own affairs without assistance, but unable to carry out all previous activities.     3 - Moderate disability. Requires some help, but able to walk unassisted.  ROS: Unreliable due to baseline impaired mental status.   Past Medical History:  Diagnosis Date   Alcohol abuse    Dementia (HCC)    GERD (gastroesophageal reflux disease)    HLD (hyperlipidemia)    HTN (hypertension)    Past Surgical History:  Procedure Laterality Date   skin cyst removal     Current Outpatient Medications  Medication Instructions   aspirin EC 81 mg, Oral, Daily, Swallow whole.   atorvastatin (LIPITOR) 20 mg, Oral, Daily   donepezil (ARICEPT) 10 mg, Daily at  bedtime   escitalopram (LEXAPRO) 10 mg, Daily   levETIRAcetam (KEPPRA) 500 mg, Oral, 2 times daily   losartan (COZAAR) 25 mg, Oral, Daily   magnesium oxide (MAG-OX) 400 mg, Oral, Daily   metoprolol succinate (TOPROL-XL) 50 mg, Oral, Daily   mirtazapine (REMERON) 30 mg, Oral, Daily at bedtime   omeprazole (PRILOSEC) 40 mg, Oral, Daily   potassium chloride  SA (KLOR-CON) 20 MEQ tablet 20 mEq, Oral, 2 times daily   traZODone (DESYREL) 200 mg, Oral, Daily at bedtime    Current Facility-Administered Medications:    acetaminophen (TYLENOL) tablet 650 mg, 650 mg, Oral, Q4H PRN **OR** acetaminophen (TYLENOL) 160 MG/5ML solution 650 mg, 650 mg, Per Tube, Q4H PRN **OR** acetaminophen (TYLENOL) suppository 650 mg, 650 mg, Rectal, Q4H PRN, Agbata, Tochukwu, MD   aspirin suppository 300 mg, 300 mg, Rectal, Daily **OR** aspirin tablet 325 mg, 325 mg, Oral, Daily, Agbata, Tochukwu, MD, 325 mg at 05/07/21 2151   atorvastatin (LIPITOR) tablet 80 mg, 80 mg, Oral, QHS, Agbata, Tochukwu, MD, 80 mg at 05/07/21 2150   folic acid (FOLVITE) tablet 1 mg, 1 mg, Oral, Daily, Agbata, Tochukwu, MD, 1 mg at 05/07/21 1818   labetalol (NORMODYNE) injection 5 mg, 5 mg, Intravenous, Q2H PRN, Hall, Carole N, DO   levETIRAcetam (KEPPRA) tablet 500 mg, 500 mg, Oral, BID, Agbata, Tochukwu, MD, 500 mg at 05/07/21 2152   LORazepam (ATIVAN) tablet 1-4 mg, 1-4 mg, Oral, Q1H PRN **OR** LORazepam (ATIVAN) injection 1-4 mg, 1-4 mg, Intravenous, Q1H PRN, Agbata, Tochukwu, MD   LORazepam (ATIVAN) tablet 0-4 mg, 0-4 mg, Oral, Q6H **FOLLOWED BY** [START ON 05/09/2021] LORazepam (ATIVAN) tablet 0-4 mg, 0-4 mg, Oral, Q12H, Agbata, Tochukwu, MD   magnesium oxide (MAG-OX) tablet 400 mg, 400 mg, Oral, Daily, Agbata, Tochukwu, MD   multivitamin with minerals tablet 1 tablet, 1 tablet, Oral, Daily, Agbata, Tochukwu, MD, 1 tablet at 05/07/21 1818   pantoprazole (PROTONIX) EC tablet 40 mg, 40 mg, Oral, Daily, Agbata, Tochukwu, MD   sodium chloride flush (NS) 0.9 % injection 3 mL, 3 mL, Intravenous, Once, Jene Every, MD   thiamine tablet 100 mg, 100 mg, Oral, Daily, 100 mg at 05/07/21 1818 **OR** thiamine (B-1) injection 100 mg, 100 mg, Intravenous, Daily, Agbata, Tochukwu, MD   traZODone (DESYREL) tablet 200 mg, 200 mg, Oral, QHS, Agbata, Tochukwu, MD, 200 mg at 05/07/21 2151   Family History   Problem Relation Age of Onset   Stroke Mother    Cancer Brother     Social History:  reports that he has been smoking. He has never used smokeless tobacco. He reports that he does not drink alcohol. No history on file for drug use.   Exam: Current vital signs: BP (!) 140/112 (BP Location: Left Arm)   Pulse (!) 59   Temp 98.1 F (36.7 C) (Oral)   Resp 17   Ht 5\' 7"  (1.702 m)   Wt 62.1 kg   SpO2 96%   BMI 21.44 kg/m  Vital signs in last 24 hours: Temp:  [97.6 F (36.4 C)-98.7 F (37.1 C)] 98.1 F (36.7 C) (06/14 0837) Pulse Rate:  [52-94] 59 (06/14 0837) Resp:  [16-20] 17 (06/14 0837) BP: (124-190)/(74-112) 140/112 (06/14 0837) SpO2:  [93 %-100 %] 96 % (06/14 0837)   Physical Exam  Constitutional: Appears thin and mildly unkempt Psych: Affect appropriate to situation, calm and cooperative Eyes: No scleral injection HENT: No oropharyngeal obstruction.  Poor dentition MSK: no joint deformities.  Cardiovascular: Irregularly irregular rhythm Respiratory: Effort  normal, non-labored breathing GI: Soft.  No distension. There is no tenderness.  Skin: Warm dry and intact visible skin  Neuro: Mental Status: Patient is awake, alert, oriented to person, place and situation but not to month Patient is able to give some history but has many discrepancies compared to the report provided by his caregiver No signs of aphasia or neglect other than mild difficulty with complex repetition Cranial Nerves: II: Visual Fields are Carter. Pupils are equal, round, and reactive to light.   III,IV, VI: EOMI without ptosis or diploplia.  V: Facial sensation is symmetric to temperature VII: Facial movement is symmetric.  VIII: hearing is intact to voice X: Uvula elevates symmetrically XI: Shoulder shrug is symmetric. XII: tongue is midline without atrophy or fasciculations.  Motor: Tone is normal. Bulk is normal.  4/5 throughout the left upper extremity though interestingly triceps is  slightly stronger (4+).  Bilateral hip flexion weakness 4+/5, otherwise 5/5 throughout the lower extremities Sensory: Sensation is symmetric to light touch and temperature in the arms and legs. Deep Tendon Reflexes: Difficult to assess as patient jerks whole-body with attempts at testing but does appear to be slightly hyperreflexic throughout (3+) Plantars: Toes are briskly withdraws bilaterally Cerebellar: Finger-nose and heel-to-shin are mildly dysmetric bilaterally  NIHSS total 5 (likely only one point for new findings of left arm drift, with remainder of findings baseline) Score breakdown: One-point for not answering month correctly one-point for not answering age correctly, one-point for left arm drift, 2 points for limb ataxia (bilaterally symmetric, likely chronic)    I have reviewed labs in epic and the results pertinent to this consultation are:  No results found for: HGBA1C (pending)  Lab Results  Component Value Date   CHOL 142 05/08/2021   HDL 98 05/08/2021   LDLCALC 34 05/08/2021   TRIG 51 05/08/2021   CHOLHDL 1.4 05/08/2021     I have reviewed the images obtained:  MRI brain personally reviewed, multifocal infarcts and a central embolic pattern most notably with involvement of the right cortical hand  MRA brain personally reviewed no large vessel occlusion or clinically significant stenosis  Carotid duplex 6/14 1. Mild (1-49%) stenosis proximal right internal carotid artery secondary to irregular/ulcerated heterogeneous atherosclerotic plaque.  2. Mild (1-49%) stenosis proximal left internal carotid artery secondary to irregular/ulcerated heterogeneous atherosclerotic  plaque. 3. Vertebral arteries are patent with normal antegrade flow.   ECHO:  1. Left ventricular ejection fraction, by estimation, is 55 to 60%. The left ventricle has normal function. The left ventricle has no regional wall motion abnormalities. Left ventricular diastolic parameters were normal.    2. Right ventricular systolic function is normal. The right ventricular size is normal.   3. The mitral valve is grossly normal. No evidence of mitral valve regurgitation.   4. The aortic valve was not well visualized. Aortic valve regurgitation is not visualized.   Impression: Strokes in the setting of atrial fibrillation not on anticoagulation. Additional complicating factors: -Dementia -Seizures -Baseline dysphagia per caregiver report -Ongoing alcohol and tobacco abuse  Recommendations:  # Multifocal small strokes, secondary to known Afib off AC -LDL meeting goal of less than 70, continue home atorvastatin 20 mg -Eliquis 5 mg, start on 6/16, continuing aspirin 325 mg until then (orders placed). Risk benefit discussed with caregiver and son Roe Coombs at length.  Richard Carter does feel that the patient will be compliant with this medication given the pill is not overly large -Risk factor modification, patient agreeable to starting nicotine patches  to aid in smoking cessation.  Additionally ordered as needed nicotine gum  #History of seizures -Continue home Keppra 500 mg twice daily -Patient gradually cutting back on alcohol with the help of his outpatient caregiver, continue plan  #History of dementia > Per outpatient neurology notes, donepezil/Aricept was discontinued on 01/03/2021 due to weight loss and decreased appetite with decrease in trazodone from 200 mg to 100 mg nightly and initiation of mirtazapine to aid with sleep as well as appetite -Resume home regimen, confirmed with caregiver  #Difficulty with swallowing large pills -SLP eval, consider change in formulation of home KLOR to packets  Neurology will be available on an as-needed basis going forward, please consult if new questions arise  Brooke DareSrishti Tomie Elko MD-PhD [facility] [phone] [facility] coverage for [facility] is from 8 AM to 4 AM in-house and 4 PM to 8 PM by telephone/video. 8 PM to 8 AM emergent  questions or overnight urgent questions should be addressed to [facility] On-call or [facility]; contact information can be found on AMION

## 2021-05-08 NOTE — Progress Notes (Signed)
   05/08/21 1415  Assess: MEWS Score  BP (!) 141/96  Pulse Rate (!) 112  SpO2 100 %  Assess: MEWS Score  MEWS Temp 0  MEWS Systolic 0  MEWS Pulse 2  MEWS RR 0  MEWS LOC 0  MEWS Score 2  MEWS Score Color Yellow  Assess: if the MEWS score is Yellow or Red  Were vital signs taken at a resting state? Yes  Focused Assessment No change from prior assessment  Early Detection of Sepsis Score *See Row Information* Low  MEWS guidelines implemented *See Row Information* Yes  Treat  MEWS Interventions Escalated (See documentation below)  Pain Scale 0-10  Pain Score 0  Take Vital Signs  Increase Vital Sign Frequency  Yellow: Q 2hr X 2 then Q 4hr X 2, if remains yellow, continue Q 4hrs  Escalate  MEWS: Escalate Yellow: discuss with charge nurse/RN and consider discussing with provider and RRT  Notify: Charge Nurse/RN  Name of Charge Nurse/RN Notified Morrie Sheldon RN  Date Charge Nurse/RN Notified 05/08/21  Time Charge Nurse/RN Notified 1430  Notify: Provider  Provider Name/Title Dr Georgeann Oppenheim  Date Provider Notified 05/08/21  Time Provider Notified 1430  Notification Type  (secure chat)  Notification Reason Other (Comment) (tachycardia)  Provider response See new orders  Date of Provider Response 05/08/21  Time of Provider Response 1435  Document  Patient Outcome Stabilized after interventions  Progress note created (see row info) Yes

## 2021-05-08 NOTE — Progress Notes (Signed)
*  PRELIMINARY RESULTS* Echocardiogram 2D Echocardiogram has been performed.  Cristela Blue 05/08/2021, 8:23 AM

## 2021-05-09 ENCOUNTER — Inpatient Hospital Stay: Payer: Medicare HMO

## 2021-05-09 DIAGNOSIS — K219 Gastro-esophageal reflux disease without esophagitis: Secondary | ICD-10-CM | POA: Diagnosis present

## 2021-05-09 DIAGNOSIS — R29705 NIHSS score 5: Secondary | ICD-10-CM | POA: Diagnosis present

## 2021-05-09 DIAGNOSIS — F0280 Dementia in other diseases classified elsewhere without behavioral disturbance: Secondary | ICD-10-CM | POA: Diagnosis present

## 2021-05-09 DIAGNOSIS — I639 Cerebral infarction, unspecified: Secondary | ICD-10-CM | POA: Diagnosis present

## 2021-05-09 DIAGNOSIS — G309 Alzheimer's disease, unspecified: Secondary | ICD-10-CM | POA: Diagnosis present

## 2021-05-09 DIAGNOSIS — I480 Paroxysmal atrial fibrillation: Secondary | ICD-10-CM | POA: Diagnosis present

## 2021-05-09 DIAGNOSIS — I10 Essential (primary) hypertension: Secondary | ICD-10-CM | POA: Diagnosis present

## 2021-05-09 DIAGNOSIS — Z87891 Personal history of nicotine dependence: Secondary | ICD-10-CM | POA: Diagnosis not present

## 2021-05-09 DIAGNOSIS — Z823 Family history of stroke: Secondary | ICD-10-CM | POA: Diagnosis not present

## 2021-05-09 DIAGNOSIS — G40909 Epilepsy, unspecified, not intractable, without status epilepticus: Secondary | ICD-10-CM | POA: Diagnosis present

## 2021-05-09 DIAGNOSIS — F0150 Vascular dementia without behavioral disturbance: Secondary | ICD-10-CM | POA: Diagnosis present

## 2021-05-09 DIAGNOSIS — Z7901 Long term (current) use of anticoagulants: Secondary | ICD-10-CM | POA: Diagnosis not present

## 2021-05-09 DIAGNOSIS — F0390 Unspecified dementia without behavioral disturbance: Secondary | ICD-10-CM | POA: Diagnosis not present

## 2021-05-09 DIAGNOSIS — I6340 Cerebral infarction due to embolism of unspecified cerebral artery: Secondary | ICD-10-CM | POA: Diagnosis present

## 2021-05-09 DIAGNOSIS — E785 Hyperlipidemia, unspecified: Secondary | ICD-10-CM | POA: Diagnosis present

## 2021-05-09 DIAGNOSIS — F1010 Alcohol abuse, uncomplicated: Secondary | ICD-10-CM | POA: Diagnosis present

## 2021-05-09 DIAGNOSIS — Z7982 Long term (current) use of aspirin: Secondary | ICD-10-CM | POA: Diagnosis not present

## 2021-05-09 DIAGNOSIS — Z66 Do not resuscitate: Secondary | ICD-10-CM | POA: Diagnosis present

## 2021-05-09 DIAGNOSIS — Z9181 History of falling: Secondary | ICD-10-CM | POA: Diagnosis not present

## 2021-05-09 DIAGNOSIS — Z9114 Patient's other noncompliance with medication regimen: Secondary | ICD-10-CM | POA: Diagnosis not present

## 2021-05-09 DIAGNOSIS — Z20822 Contact with and (suspected) exposure to covid-19: Secondary | ICD-10-CM | POA: Diagnosis present

## 2021-05-09 DIAGNOSIS — Z79899 Other long term (current) drug therapy: Secondary | ICD-10-CM | POA: Diagnosis not present

## 2021-05-09 LAB — BASIC METABOLIC PANEL
Anion gap: 14 (ref 5–15)
BUN: 23 mg/dL (ref 8–23)
CO2: 22 mmol/L (ref 22–32)
Calcium: 10.1 mg/dL (ref 8.9–10.3)
Chloride: 100 mmol/L (ref 98–111)
Creatinine, Ser: 1 mg/dL (ref 0.61–1.24)
GFR, Estimated: >60 mL/min (ref >60)
Glucose, Bld: 86 mg/dL (ref 70–99)
Potassium: 3.8 mmol/L (ref 3.5–5.1)
Sodium: 136 mmol/L (ref 135–145)

## 2021-05-09 LAB — CBC WITH DIFFERENTIAL/PLATELET
Abs Immature Granulocytes: 0.03 10*3/uL (ref 0.00–0.07)
Basophils Absolute: 0 10*3/uL (ref 0.0–0.1)
Basophils Relative: 0 %
Eosinophils Absolute: 0.4 10*3/uL (ref 0.0–0.5)
Eosinophils Relative: 5 %
HCT: 35.5 % — ABNORMAL LOW (ref 39.0–52.0)
Hemoglobin: 12.3 g/dL — ABNORMAL LOW (ref 13.0–17.0)
Immature Granulocytes: 0 %
Lymphocytes Relative: 20 %
Lymphs Abs: 1.5 10*3/uL (ref 0.7–4.0)
MCH: 36.2 pg — ABNORMAL HIGH (ref 26.0–34.0)
MCHC: 34.6 g/dL (ref 30.0–36.0)
MCV: 104.4 fL — ABNORMAL HIGH (ref 80.0–100.0)
Monocytes Absolute: 1 10*3/uL (ref 0.1–1.0)
Monocytes Relative: 15 %
Neutro Abs: 4.2 10*3/uL (ref 1.7–7.7)
Neutrophils Relative %: 60 %
Platelets: 194 10*3/uL (ref 150–400)
RBC: 3.4 MIL/uL — ABNORMAL LOW (ref 4.22–5.81)
RDW: 18.3 % — ABNORMAL HIGH (ref 11.5–15.5)
WBC: 7.1 10*3/uL (ref 4.0–10.5)
nRBC: 0 % (ref 0.0–0.2)

## 2021-05-09 LAB — HEMOGLOBIN A1C
Hgb A1c MFr Bld: 5.2 % (ref 4.8–5.6)
Mean Plasma Glucose: 103 mg/dL

## 2021-05-09 LAB — BLOOD GAS, ARTERIAL
Acid-Base Excess: 0.6 mmol/L (ref 0.0–2.0)
Allens test (pass/fail): POSITIVE — AB
Bicarbonate: 24.3 mmol/L (ref 20.0–28.0)
FIO2: 0.21
O2 Saturation: 97.3 %
Patient temperature: 37
pCO2 arterial: 35 mmHg (ref 32.0–48.0)
pH, Arterial: 7.45 (ref 7.350–7.450)
pO2, Arterial: 90 mmHg (ref 83.0–108.0)

## 2021-05-09 LAB — VITAMIN B12: Vitamin B-12: 192 pg/mL (ref 180–914)

## 2021-05-09 LAB — GLUCOSE, CAPILLARY: Glucose-Capillary: 78 mg/dL (ref 70–99)

## 2021-05-09 MED ORDER — MIRTAZAPINE 15 MG PO TABS: 30.0000 mg | ORAL_TABLET | Freq: Every day | ORAL | Status: DC

## 2021-05-09 MED ORDER — VITAMIN B-12 1000 MCG PO TABS
1000.0000 ug | ORAL_TABLET | Freq: Every day | ORAL | Status: DC
  Administered 2021-05-10 – 2021-05-11 (×3): 1000 ug via ORAL
  Filled 2021-05-09 (×3): qty 1

## 2021-05-09 MED ORDER — TRAZODONE HCL 50 MG PO TABS
100.0000 mg | ORAL_TABLET | Freq: Every day | ORAL | Status: DC
  Administered 2021-05-10: 22:00:00 100 mg via ORAL
  Filled 2021-05-09: qty 2

## 2021-05-09 MED ORDER — ATORVASTATIN CALCIUM 20 MG PO TABS
20.0000 mg | ORAL_TABLET | Freq: Every day | ORAL | Status: DC
  Administered 2021-05-10 (×2): 20 mg via ORAL
  Filled 2021-05-09 (×2): qty 1

## 2021-05-09 MED ORDER — APIXABAN 5 MG PO TABS
5.0000 mg | ORAL_TABLET | Freq: Two times a day (BID) | ORAL | Status: DC
  Administered 2021-05-10 – 2021-05-11 (×3): 5 mg via ORAL
  Filled 2021-05-09 (×3): qty 1

## 2021-05-09 MED ORDER — APIXABAN 5 MG PO TABS: 5.0000 mg | ORAL_TABLET | Freq: Two times a day (BID) | ORAL | Status: DC

## 2021-05-09 NOTE — NC FL2 (Signed)
[place] MEDICAID FL2 LEVEL OF CARE SCREENING TOOL     IDENTIFICATION  Patient Name: Richard Carter Birthdate: Oct 07, 1941 Sex: male Admission Date (Current Location): 05/07/2021  [facility] and [place] Number:  Chiropodist and Address:  [facility], [address], [place], [place] [address]      Provider Number: [id number]  Attending Physician Name and Address:  Tresa Moore, MD  Relative Name and Phone Number:       Current Level of Care: Hospital Recommended Level of Care: Skilled Nursing Facility Prior Approval Number:    Date Approved/Denied:   PASRR Number: [id number]  Discharge Plan: SNF    Current Diagnoses: Patient Active Problem List   Diagnosis Date Noted   Acute CVA (cerebrovascular accident) (HCC) 05/07/2021   Non compliance w medication regimen 05/07/2021   Dementia (HCC) 05/07/2021   AF (paroxysmal atrial fibrillation) (HCC) 05/07/2021   HTN (hypertension) 11/29/2020   Alcohol abuse 11/29/2020   AKI (acute kidney injury) (HCC) 11/29/2020   Atrial flutter with rapid ventricular response (HCC) 11/29/2020   Seizure (HCC) 02/22/2020   Pubic ramus fracture (HCC) 07/29/2019   Acetabular fracture (HCC) 07/29/2019   HLD (hyperlipidemia) 07/29/2019   GERD (gastroesophageal reflux disease) 07/29/2019   UTI (urinary tract infection) 07/29/2019    Orientation RESPIRATION BLADDER Height & Weight     Self, Place, Time  Normal Continent Weight: 136 lb 14.5 oz (62.1 kg) Height:  5\' 7"  (170.2 cm)  BEHAVIORAL SYMPTOMS/MOOD NEUROLOGICAL BOWEL NUTRITION STATUS  Other (Comment) (Anxiety) Convulsions/Seizures (Dementia. History of seizure (March).) Continent Diet (DYS 3. 2gm sodium. Please chop meats.)  AMBULATORY STATUS COMMUNICATION OF NEEDS Skin   Limited Assist Verbally Bruising                       Personal Care Assistance Level of Assistance  Bathing, Feeding, Dressing Bathing Assistance: Limited  assistance Feeding assistance: Independent Dressing Assistance: Limited assistance     Functional Limitations Info  Sight, Hearing, Speech Sight Info: Adequate Hearing Info: Adequate Speech Info: Adequate    SPECIAL CARE FACTORS FREQUENCY  PT (By licensed PT), OT (By licensed OT)     PT Frequency: 5 x week OT Frequency: 5 x week            Contractures Contractures Info: Not present    Additional Factors Info  Code Status, Allergies Code Status Info: DNR Allergies Info: NKDA           Current Medications (05/09/2021):  This is the current hospital active medication list Current Facility-Administered Medications  Medication Dose Route Frequency Provider Last Rate Last Admin   acetaminophen (TYLENOL) tablet 650 mg  650 mg Oral Q4H PRN Agbata, Tochukwu, MD       Or   acetaminophen (TYLENOL) 160 MG/5ML solution 650 mg  650 mg Per Tube Q4H PRN Agbata, Tochukwu, MD       Or   acetaminophen (TYLENOL) suppository 650 mg  650 mg Rectal Q4H PRN Agbata, Tochukwu, MD       [START ON 05/10/2021] apixaban (ELIQUIS) tablet 5 mg  5 mg Oral BID Bhagat, Srishti L, MD       aspirin tablet 325 mg  325 mg Oral Once Bhagat, Srishti L, MD       atorvastatin (LIPITOR) tablet 20 mg  20 mg Oral QHS Sreenath, Sudheer B, MD       folic acid (FOLVITE) tablet 1 mg  1 mg Oral Daily Agbata, Tochukwu,  MD   1 mg at 05/08/21 3235   hydrALAZINE (APRESOLINE) injection 10 mg  10 mg Intravenous Q4H PRN Lolita Patella B, MD       levETIRAcetam (KEPPRA) tablet 500 mg  500 mg Oral BID Agbata, Tochukwu, MD   500 mg at 05/08/21 2221   LORazepam (ATIVAN) tablet 1-4 mg  1-4 mg Oral Q1H PRN Agbata, Tochukwu, MD       Or   LORazepam (ATIVAN) injection 1-4 mg  1-4 mg Intravenous Q1H PRN Agbata, Tochukwu, MD       LORazepam (ATIVAN) tablet 0-4 mg  0-4 mg Oral Q6H Agbata, Tochukwu, MD   2 mg at 05/09/21 0247   Followed by   LORazepam (ATIVAN) tablet 0-4 mg  0-4 mg Oral Q12H Agbata, Tochukwu, MD       losartan  (COZAAR) tablet 25 mg  25 mg Oral Daily Georgeann Oppenheim, Sudheer B, MD   25 mg at 05/08/21 1427   magnesium oxide (MAG-OX) tablet 400 mg  400 mg Oral Daily Agbata, Tochukwu, MD   400 mg at 05/08/21 5732   metoprolol succinate (TOPROL-XL) 24 hr tablet 50 mg  50 mg Oral Daily Georgeann Oppenheim, Sudheer B, MD   50 mg at 05/08/21 1427   metoprolol tartrate (LOPRESSOR) injection 5 mg  5 mg Intravenous Q2H PRN Sreenath, Sudheer B, MD       mirtazapine (REMERON) tablet 30 mg  30 mg Oral QHS Bhagat, Srishti L, MD   30 mg at 05/08/21 2221   multivitamin with minerals tablet 1 tablet  1 tablet Oral Daily Agbata, Tochukwu, MD   1 tablet at 05/08/21 2025   nicotine (NICODERM CQ - dosed in mg/24 hours) patch 14 mg  14 mg Transdermal Daily Bhagat, Srishti L, MD   14 mg at 05/09/21 1030   nicotine polacrilex (NICORETTE) gum 2 mg  2 mg Oral PRN Bhagat, Srishti L, MD       pantoprazole (PROTONIX) EC tablet 40 mg  40 mg Oral Daily Agbata, Tochukwu, MD   40 mg at 05/08/21 4270   sodium chloride flush (NS) 0.9 % injection 3 mL  3 mL Intravenous Once Jene Every, MD       spiritus frumenti (ethyl alcohol) solution 1 each  1 each Oral Daily PRN Lolita Patella B, MD   1 each at 05/08/21 2050   thiamine tablet 100 mg  100 mg Oral Daily Agbata, Tochukwu, MD   100 mg at 05/08/21 6237   Or   thiamine (B-1) injection 100 mg  100 mg Intravenous Daily Agbata, Tochukwu, MD       traZODone (DESYREL) tablet 100 mg  100 mg Oral QHS Bhagat, Srishti L, MD   100 mg at 05/08/21 2221     Discharge Medications: Please see discharge summary for a list of discharge medications.  Relevant Imaging Results:  Relevant Lab Results:   Additional Information SS#: [id number]  Margarito Liner, LCSW

## 2021-05-09 NOTE — Progress Notes (Signed)
   05/09/21 1000  Assess: MEWS Score  Temp (!) 97.5 F (36.4 C)  BP (!) 174/96  Pulse Rate (!) 46  Resp 12  Level of Consciousness Responds to Voice  SpO2 100 %  Assess: MEWS Score  MEWS Temp 0  MEWS Systolic 0  MEWS Pulse 1  MEWS RR 1  MEWS LOC 1  MEWS Score 3  MEWS Score Color Yellow  Assess: if the MEWS score is Yellow or Red  Were vital signs taken at a resting state? Yes  Focused Assessment No change from prior assessment  Early Detection of Sepsis Score *See Row Information* Low  MEWS guidelines implemented *See Row Information* Yes  Treat  MEWS Interventions Other (Comment)  Pain Scale 0-10  Pain Score 0  Take Vital Signs  Increase Vital Sign Frequency  Yellow: Q 2hr X 2 then Q 4hr X 2, if remains yellow, continue Q 4hrs  Escalate  MEWS: Escalate Yellow: discuss with charge nurse/RN and consider discussing with provider and RRT  Notify: Charge Nurse/RN  Name of Charge Nurse/RN Notified ashley  Date Charge Nurse/RN Notified 05/09/21  Time Charge Nurse/RN Notified 1051  Notify: Provider  Provider Name/Title dr Georgeann Oppenheim  Date Provider Notified 05/09/21  Time Provider Notified 1052  Notification Type Call  Notification Reason Change in status  Provider response Other (Comment) (give PRN hydralizine)  Date of Provider Response 05/09/21  Time of Provider Response 1054  Document  Patient Outcome Other (Comment) (will contine monitor)  Notified MD pt is lethargic, unable to arouse to take pills. MD states to treat BP with PRN med. Will order ABG, let pt rest for 2-3 hours. Then reassess

## 2021-05-09 NOTE — Progress Notes (Signed)
HR is 46-54 holding lopressor BP 165/88 paged MD

## 2021-05-09 NOTE — Progress Notes (Signed)
PROGRESS NOTE    Richard Carter  WRU:[id number] DOB: September 02, 1941 DOA: 05/07/2021 PCP: Marisue Ivan, MD   Brief Narrative:  80 y.o. male with medical history significant for paroxysmal atrial fibrillation not on anticoagulation due to history of medication noncompliance and increased risk of fall, historyof alcohol and nicotine dependence, history of vascular and Alzheimer's dementia who presents to the emergency room for evaluation of left arm weakness.  His neighbor states that his symptoms started between 4 and 6 PM, 2 days prior to his presentation to the ER.  He had seen him earlier in the day and patient was fine but when he went back to check on him at about 6 PM patient complained about weakness involving his left hand.  He was unable to pick up items with that hand but refused to come to the hospital to get checked.  He was finally able to convince him to come in on the day of admission. He denies having any chest pain, no shortness of breath, no headache, no difficulty swallowing, no dizziness, no lightheadedness, no fall, no blurred vision, no focal deficits, no fever, no chills, no urinary symptoms or changes in his bowel habits.  MRI imaging survey consistent with acute CVA.  Likely etiology embolic.  Seen by neurology.  Outside the window for any tPA.  Recommend symptom management and rehabilitation.  Patient essentially medically ready for discharge at this time.  He is very high risk for withdrawal so after conversations with: Inpatient pharmacy we were able to acquire beer while admitted.     Assessment & Plan:   Principal Problem:   Acute CVA (cerebrovascular accident) (HCC) Active Problems:   Seizure (HCC)   HTN (hypertension)   Alcohol abuse   Non compliance w medication regimen   Dementia (HCC)   AF (paroxysmal atrial fibrillation) (HCC)  Acute CVA Likely embolic etiology.  Patient has a history of paroxysmal atrial fibrillation.  No anticoagulation due to history  of medication nonadherence and increased fall likelihood. Seen by neurology who added Eliquis 5 mg twice daily Plan:  Continue Eliquis 5 mg twice daily Continue home atorvastatin 20 mg daily Normotensive blood pressure goal Stable for discharge to skilled nursing facility   History of nicotine dependence Smoking cessation has been discussed with patient in detail Continue NicoDerm patch   Seizure disorder Keppra Seizure precautions   Dementia Mixed (vascular and Alzheimer dementia) Continue Aricept, Lexapro and trazodone   History of alcohol abuse Patient is not interested in quitting drinking.  He is high risk for development of withdrawals.  That being said he has not exhibited any specific withdrawals while in house.  We have been able to order some beer while admitted to prophylax against development of withdrawal. Plan: Daily multivitamin, thiamine, folic acid  Hypertension Home regimen Cozaar 25 mg daily, Toprol-XL 50 mg daily BP agents been restarted  History of paroxysmal atrial fibrillation Alternates between sinus rhythm and atrial fibrillation.  Has been relatively well rate controlled.  Eliquis started per neurology recommendations. Plan: Continue Eliquis 5 twice daily Continue Toprol succinate 50 mg daily  DVT prophylaxis: SCDs Code Status: DNR Family Communication: Neighbor/caregiver at bedside.  Sister via phone on 6/14 Disposition Plan: Status is: Inpatient  Remains inpatient appropriate because:Unsafe d/c plan  Dispo: The patient is from: Home              Anticipated d/c is to: SNF              Patient currently  is medically stable to d/c.   Difficult to place patient No  Acute CVA.  Essentially medically ready for discharge at this time.  Pending skilled nursing facility placement.  TOC aware.             Level of care: Med-Surg  Consultants:  Neurology  Procedures:  None  Antimicrobials:  None   Subjective: Patient seen and  examined.  Resting comfortably bed.  No visible distress.  Objective: Vitals:   05/08/21 2057 05/08/21 2359 05/09/21 0509 05/09/21 0907  BP: (!) 149/87 123/60 (!) 166/79 (!) 165/88  Pulse: (!) 57 (!) 59 (!) 51 (!) 50  Resp: 16 16 14 16   Temp: (!) 97.4 F (36.3 C) 98 F (36.7 C) 98 F (36.7 C) 98.7 F (37.1 C)  TempSrc: Oral Axillary  Axillary  SpO2: 98% 97% 100% 100%  Weight:      Height:        Intake/Output Summary (Last 24 hours) at 05/09/2021 1012 Last data filed at 05/08/2021 1700 Gross per 24 hour  Intake 720 ml  Output 300 ml  Net 420 ml   Filed Weights   05/07/21 0824  Weight: 62.1 kg    Examination:  General exam: No acute distress.  Appears frail and chronically ill Respiratory system: Lungs clear.  Normal work of breathing.  Room air Cardiovascular system: S1-S2, regular rate and rhythm, no murmurs Gastrointestinal system: Abdomen is nondistended, soft and nontender. No organomegaly or masses felt. Normal bowel sounds heard. Central nervous system: Alert, oriented x2, no focal deficits Extremities: Diffusely decreased power.  Gait not assessed Skin: No rashes, lesions or ulcers Psychiatry: Judgement and insight appear impaired. Mood & affect flattened.     Data Reviewed: I have personally reviewed following labs and imaging studies  CBC: Recent Labs  Lab 05/07/21 0900 05/09/21 0858  WBC 6.9 7.1  NEUTROABS 3.9 4.2  HGB 12.4* 12.3*  HCT 35.7* 35.5*  MCV 104.7* 104.4*  PLT 183 194   Basic Metabolic Panel: Recent Labs  Lab 05/07/21 0900 05/09/21 0858  NA 139 136  K 3.8 3.8  CL 104 100  CO2 27 22  GLUCOSE 109* 86  BUN 19 23  CREATININE 1.22 1.00  CALCIUM 9.9 10.1  MG 1.6*  --   PHOS 2.9  --    GFR: Estimated Creatinine Clearance: 50.9 mL/min (by C-G formula based on SCr of 1 mg/dL). Liver Function Tests: Recent Labs  Lab 05/07/21 0900  AST 54*  ALT 26  ALKPHOS 75  BILITOT 1.4*  PROT 7.0  ALBUMIN 3.8   No results for input(s):  LIPASE, AMYLASE in the last 168 hours. No results for input(s): AMMONIA in the last 168 hours. Coagulation Profile: Recent Labs  Lab 05/07/21 0900  INR 0.9   Cardiac Enzymes: No results for input(s): CKTOTAL, CKMB, CKMBINDEX, TROPONINI in the last 168 hours. BNP (last 3 results) No results for input(s): PROBNP in the last 8760 hours. HbA1C: Recent Labs    05/08/21 0513  HGBA1C 5.2   CBG: No results for input(s): GLUCAP in the last 168 hours. Lipid Profile: Recent Labs    05/08/21 0513  CHOL 142  HDL 98  LDLCALC 34  TRIG 51  CHOLHDL 1.4   Thyroid Function Tests: No results for input(s): TSH, T4TOTAL, FREET4, T3FREE, THYROIDAB in the last 72 hours. Anemia Panel: No results for input(s): VITAMINB12, FOLATE, FERRITIN, TIBC, IRON, RETICCTPCT in the last 72 hours. Sepsis Labs: No results for input(s): PROCALCITON, LATICACIDVEN in the  last 168 hours.  Recent Results (from the past 240 hour(s))  Resp Panel by RT-PCR (Flu A&B, Covid) Nasopharyngeal Swab     Status: None   Collection Time: 05/07/21 10:50 AM   Specimen: Nasopharyngeal Swab; Nasopharyngeal(NP) swabs in vial transport medium  Result Value Ref Range Status   SARS Coronavirus 2 by RT PCR NEGATIVE NEGATIVE Final    Comment: (NOTE) SARS-CoV-2 target nucleic acids are NOT DETECTED.  The SARS-CoV-2 RNA is generally detectable in upper respiratory specimens during the acute phase of infection. The lowest concentration of SARS-CoV-2 viral copies this assay can detect is 138 copies/mL. A negative result does not preclude SARS-Cov-2 infection and should not be used as the sole basis for treatment or other patient management decisions. A negative result may occur with  improper specimen collection/handling, submission of specimen other than nasopharyngeal swab, presence of viral mutation(s) within the areas targeted by this assay, and inadequate number of viral copies(<138 copies/mL). A negative result must be combined  with clinical observations, patient history, and epidemiological information. The expected result is Negative.  Fact Sheet for Patients:  BloggerCourse.com  Fact Sheet for Healthcare Providers:  SeriousBroker.it  This test is no t yet approved or cleared by the [place] FDA and  has been authorized for detection and/or diagnosis of SARS-CoV-2 by FDA under an Emergency Use Authorization (EUA). This EUA will remain  in effect (meaning this test can be used) for the duration of the COVID-19 declaration under Section 564(b)(1) of the Act, 21 U.S.C.section 360bbb-3(b)(1), unless the authorization is terminated  or revoked sooner.       Influenza A by PCR NEGATIVE NEGATIVE Final   Influenza B by PCR NEGATIVE NEGATIVE Final    Comment: (NOTE) The Xpert Xpress SARS-CoV-2/FLU/RSV plus assay is intended as an aid in the diagnosis of influenza from Nasopharyngeal swab specimens and should not be used as a sole basis for treatment. Nasal washings and aspirates are unacceptable for Xpert Xpress SARS-CoV-2/FLU/RSV testing.  Fact Sheet for Patients: BloggerCourse.com  Fact Sheet for Healthcare Providers: SeriousBroker.it  This test is not yet approved or cleared by the [place] FDA and has been authorized for detection and/or diagnosis of SARS-CoV-2 by FDA under an Emergency Use Authorization (EUA). This EUA will remain in effect (meaning this test can be used) for the duration of the COVID-19 declaration under Section 564(b)(1) of the Act, 21 U.S.C. section 360bbb-3(b)(1), unless the authorization is terminated or revoked.  Performed at [facility], [address], [place], [place] [address]          Radiology Studies: MR ANGIO HEAD WO CONTRAST  Result Date: 05/07/2021 CLINICAL DATA:  Acute cerebral and cerebellar infarcts on MRI. EXAM: MRA HEAD WITHOUT  CONTRAST TECHNIQUE: Angiographic images of the Circle of Willis were acquired using MRA technique without intravenous contrast. COMPARISON:  No pertinent prior exam. FINDINGS: Anterior circulation: The internal carotid arteries are patent from skull base to carotid termini with moderate stenosis in the region of the anterior genu, right worse than left. ACAs and MCAs are patent without evidence of a proximal branch occlusion or significant A1 or M1 stenosis. There is moderate irregular narrowing of more distal branch vessels. No aneurysm is identified. Posterior circulation: The visualized distal vertebral arteries are patent with the left being strongly dominant. Patent right PICA, left AICA, and bilateral SCA origins are visualized. Basilar artery is widely patent. There is a small left posterior communicating artery. Both PCAs are patent with branch vessel attenuation  but no evidence of a significant proximal stenosis. No aneurysm is identified. Anatomic variants: Strongly dominant left vertebral artery. Other: None. IMPRESSION: 1. No large vessel occlusion. 2. Atherosclerosis including moderate bilateral ICA stenoses. Electronically Signed   By: Sebastian Ache M.D.   On: 05/07/2021 17:46   MR BRAIN WO CONTRAST  Result Date: 05/07/2021 CLINICAL DATA:  TIA, left arm weakness EXAM: MRI HEAD WITHOUT CONTRAST TECHNIQUE: Multiplanar, multiecho pulse sequences of the brain and surrounding structures were obtained without intravenous contrast. COMPARISON:  02/23/2020 FINDINGS: Brain: Small areas of cortical restricted diffusion are present in the right frontoparietal lobes with notable involvement of the precentral gyrus in the hand motor region. Subtle corresponding loss of gray-white differentiation is noted on the prior CT. Additional small foci of reduced diffusion are present in the right posterior lentiform nucleus, left hippocampus, and left cerebellum. Few scattered foci of susceptibility in the cerebral  subcortical white matter, left thalamus, and left cerebellum compatible with chronic microhemorrhages. Patchy and confluent areas of T2 hyperintensity in the supratentorial white matter are nonspecific but probably reflect mild to moderate chronic microvascular ischemic changes similar to the prior study. Chronic cortical/subcortical infarcts of the left frontal lobe. Small chronic infarcts of bilateral basal ganglia and corona radiata and right pons. Prominence of the ventricles and sulci reflects generalized parenchymal volume loss similar to the prior study. There is no intracranial mass or mass effect. No hydrocephalus or extra-axial collection. Vascular: Major vessel flow voids at the skull base are preserved. Skull and upper cervical spine: Normal marrow signal is preserved. Sinuses/Orbits: Paranasal sinuses are aerated. Orbits are unremarkable. Other: Sella is unremarkable.  Mastoid air cells are clear. IMPRESSION: Small acute infarcts in the right frontoparietal lobes, right posterior lentiform nucleus, left hippocampus, and left cerebellum. There is notable involvement of the right hand motor region. Chronic infarcts and chronic microvascular ischemic changes as seen previously. Few chronic microhemorrhages secondary to hypertension and/or amyloid angiopathy. Electronically Signed   By: Guadlupe Spanish M.D.   On: 05/07/2021 10:48   US Carotid Bilateral  Result Date: 05/08/2021 CLINICAL DATA:  Stroke-like symptoms EXAM: BILATERAL CAROTID DUPLEX ULTRASOUND TECHNIQUE: Wallace Cullens scale imaging, color Doppler and duplex ultrasound were performed of bilateral carotid and vertebral arteries in the neck. COMPARISON:  None. FINDINGS: Criteria: Quantification of carotid stenosis is based on velocity parameters that correlate the residual internal carotid diameter with NASCET-based stenosis levels, using the diameter of the distal internal carotid lumen as the denominator for stenosis measurement. The following velocity  measurements were obtained: RIGHT ICA: 59/7 cm/sec CCA: 58/11 cm/sec SYSTOLIC ICA/CCA RATIO:  1.0 ECA:  103 cm/sec LEFT ICA: 111/14 cm/sec CCA: 62/10 cm/sec SYSTOLIC ICA/CCA RATIO:  1.8 ECA:  156 cm/sec RIGHT CAROTID ARTERY: Extensively irregular/ulcerated heterogeneous atherosclerotic plaque throughout the common carotid artery extending into the proximal internal carotid artery. By peak systolic velocity criteria, the estimated stenosis is less than 50%. RIGHT VERTEBRAL ARTERY:  Patent with antegrade flow. LEFT CAROTID ARTERY: Irregular/ulcerated heterogeneous atherosclerotic plaque in the distal common carotid artery and proximal internal carotid artery. By peak systolic velocity criteria, the estimated stenosis is less than 50%. LEFT VERTEBRAL ARTERY:  Patent with antegrade flow. IMPRESSION: 1. Mild (1-49%) stenosis proximal right internal carotid artery secondary to irregular/ulcerated heterogeneous atherosclerotic plaque. 2. Mild (1-49%) stenosis proximal left internal carotid artery secondary to irregular/ulcerated heterogeneous atherosclerotic plaque. 3. Vertebral arteries are patent with normal antegrade flow. Signed, Sterling Big, MD, RPVI [facility] Radiology Electronically Signed  By: Malachy Moan M.D.   On: 05/08/2021 08:18   ECHOCARDIOGRAM COMPLETE  Result Date: 05/08/2021    ECHOCARDIOGRAM REPORT   Patient Name:   Richard Carter Date of Exam: 05/08/2021 Medical Rec #:  [id number]        Height:       67.0 in Accession #:    [id number]       Weight:       136.9 lb Date of Birth:  05/20/1941         BSA:          1.721 m Patient Age:    79 years         BP:           149/87 mmHg Patient Gender: M                HR:           63 bpm. Exam Location:  [facility] Procedure: 2D Echo, Cardiac Doppler and Color Doppler Indications:     Stroke I63.9  History:         Patient has no prior history of Echocardiogram examinations.                  Risk  Factors:Hypertension.  Sonographer:     Cristela Blue RDCS (AE) Referring Phys:  [id number] Lucile Shutters Diagnosing Phys: Harold Hedge MD  Sonographer Comments: Suboptimal parasternal window. IMPRESSIONS  1. Left ventricular ejection fraction, by estimation, is 55 to 60%. The left ventricle has normal function. The left ventricle has no regional wall motion abnormalities. Left ventricular diastolic parameters were normal.  2. Right ventricular systolic function is normal. The right ventricular size is normal.  3. The mitral valve is grossly normal. No evidence of mitral valve regurgitation.  4. The aortic valve was not well visualized. Aortic valve regurgitation is not visualized. FINDINGS  Left Ventricle: Left ventricular ejection fraction, by estimation, is 55 to 60%. The left ventricle has normal function. The left ventricle has no regional wall motion abnormalities. The left ventricular internal cavity size was normal in size. There is  no left ventricular hypertrophy. Left ventricular diastolic parameters were normal. Right Ventricle: The right ventricular size is normal. No increase in right ventricular wall thickness. Right ventricular systolic function is normal. Left Atrium: Left atrial size was normal in size. Right Atrium: Right atrial size was normal in size. Pericardium: There is no evidence of pericardial effusion. Mitral Valve: The mitral valve is grossly normal. No evidence of mitral valve regurgitation. Tricuspid Valve: The tricuspid valve is grossly normal. Tricuspid valve regurgitation is trivial. Aortic Valve: The aortic valve was not well visualized. Aortic valve regurgitation is not visualized. Aortic valve mean gradient measures 3.0 mmHg. Aortic valve peak gradient measures 5.2 mmHg. Aortic valve area, by VTI measures 2.96 cm. Pulmonic Valve: The pulmonic valve was not well visualized. Pulmonic valve regurgitation is not visualized. Aorta: The aortic root is normal in size and structure.  IAS/Shunts: The atrial septum is grossly normal.  LEFT VENTRICLE PLAX 2D LVIDd:         3.30 cm  Diastology LVIDs:         2.30 cm  LV e' medial:    4.90 cm/s LV PW:         1.20 cm  LV E/e' medial:  15.8 LV IVS:        1.00 cm  LV e' lateral:   7.62 cm/s LVOT diam:  2.00 cm  LV E/e' lateral: 10.2 LV SV:         68 LV SV Index:   40 LVOT Area:     3.14 cm  RIGHT VENTRICLE RV Basal diam:  2.20 cm RV S prime:     10.20 cm/s TAPSE (M-mode): 4.1 cm LEFT ATRIUM             Index       RIGHT ATRIUM           Index LA diam:        3.30 cm 1.92 cm/m  RA Area:     13.70 cm LA Vol (A2C):   54.4 ml 31.60 ml/m RA Volume:   28.40 ml  16.50 ml/m LA Vol (A4C):   40.7 ml 23.64 ml/m LA Biplane Vol: 47.4 ml 27.54 ml/m  AORTIC VALVE                   PULMONIC VALVE AV Area (Vmax):    2.43 cm    PV Vmax:        0.54 m/s AV Area (Vmean):   2.35 cm    PV Peak grad:   1.2 mmHg AV Area (VTI):     2.96 cm    RVOT Peak grad: 2 mmHg AV Vmax:           114.05 cm/s AV Vmean:          75.550 cm/s AV VTI:            0.231 m AV Peak Grad:      5.2 mmHg AV Mean Grad:      3.0 mmHg LVOT Vmax:         88.10 cm/s LVOT Vmean:        56.500 cm/s LVOT VTI:          0.218 m LVOT/AV VTI ratio: 0.94  AORTA Ao Root diam: 3.40 cm MITRAL VALVE                TRICUSPID VALVE MV Area (PHT): 3.31 cm     TR Peak grad:   9.2 mmHg MV Decel Time: 229 msec     TR Vmax:        152.00 cm/s MV E velocity: 77.60 cm/s MV A velocity: 102.00 cm/s  SHUNTS MV E/A ratio:  0.76         Systemic VTI:  0.22 m                             Systemic Diam: 2.00 cm Harold Hedge MD Electronically signed by Harold Hedge MD Signature Date/Time: 05/08/2021/10:41:55 AM    Final         Scheduled Meds:  Melene Muller ON 05/10/2021] apixaban  5 mg Oral BID   aspirin  325 mg Oral Once   atorvastatin  80 mg Oral QHS   folic acid  1 mg Oral Daily   levETIRAcetam  500 mg Oral BID   LORazepam  0-4 mg Oral Q6H   Followed by   LORazepam  0-4 mg Oral Q12H   losartan  25 mg Oral  Daily   magnesium oxide  400 mg Oral Daily   metoprolol succinate  50 mg Oral Daily   mirtazapine  30 mg Oral QHS   multivitamin with minerals  1 tablet Oral Daily   nicotine  14 mg Transdermal Daily   pantoprazole  40 mg Oral Daily  sodium chloride flush  3 mL Intravenous Once   thiamine  100 mg Oral Daily   Or   thiamine  100 mg Intravenous Daily   traZODone  100 mg Oral QHS   Continuous Infusions:   LOS: 0 days    Time spent: 25 minutes    Tresa MooreSudheer B Walburga Hudman, MD [facility] Pager [phone]  If 7PM-7AM, please contact night-coverage 05/09/2021, 10:12 AM

## 2021-05-09 NOTE — Progress Notes (Signed)
PT Cancellation Note  Patient Details Name: Richard Carter MRN: [id number] DOB: 1941/04/06   Cancelled Treatment:    Reason Eval/Treat Not Completed: Fatigue/lethargy limiting ability to participate (Patient reviewed for planned treatment session.  Per chart review, patient lethargic and unable to participate with session at this time; MEWS recently changed to red.  Will hold PT at this time and re-attempt at later time/date as medically appropriate.)  Atiya Yera H. Manson Passey, PT, DPT, NCS 05/09/21, 11:35 AM [phone]

## 2021-05-09 NOTE — Progress Notes (Signed)
I have reviewed and concur with the nursing student's assessment and documentation.  The patient is noted to be more alert at this time.  He remains on the monitor and has frequent vital sign monitoring.  Patient did take his daily meds.  Kristen in Pharmacy was notified that meds were delayed due to patient's lethargy this morning.  After his neuro eval and CT, we were asked to proceed with administration.  Pharmacy to adjust next dose times if needed.  Patient was able to swallow small pills with sips of water.  Larger pills given with applesauce.  Patient noted to have some coughing with the larger pills.  Primary RN made aware of the same.  Adin Hector, RN, Nursing Instructor

## 2021-05-09 NOTE — Progress Notes (Signed)
SLP Cancellation Note  Patient Details Name: Richard Carter MRN: [id number] DOB: 12-Jun-1941   Cancelled treatment:       Reason Eval/Treat Not Completed: Fatigue/lethargy limiting ability to participate (chart reviewed; consulted NSG). Per discussion w/ NSG re: toleration of oral diet today, NSG reported pt was more lethargic and could not follow through w/ tasks d/t decreased alertness. Further tests have been ordered by MD. Diet downgraded to a Pureed diet w/ thin liquids at this time d/t concern w/ strict aspiration precautions re: any oral intake. NSG agreed. Precautions posted in chart. ST services will f/u tomorrow w/ pt's status and potential upgrade back to original diet consistency.       Jerilynn Som, MS, CCC-SLP Speech Language Pathologist [facility]hab Services [phone] [facility] 05/09/2021, 4:46 PM

## 2021-05-09 NOTE — TOC Progression Note (Addendum)
Transition of Care [facility]) - Progression Note    Patient Details  Name: Richard Carter MRN: [id number] Date of Birth: [birth year]  [facility] CM/SW Contact  Margarito Liner, LCSW Phone Number: 05/09/2021, 1:41 PM  Clinical Narrative:   Spoke to patient's son, Richard Carter. He lives in [place] but is currently in [place] until next week. He does not think SNF placement is necessary but is agreeable if that is what patient wants to do. He gave CSW permission to discuss with patient's caregiver, Renaldo Reel, since he is with patient more often. CSW called [name]. He prefers [facility] because he knows people that work that. He will reach out to them to see if they can accept.  2:24 pm: [facility] has made a bed offer.  Expected Discharge Plan: Skilled Nursing Facility Barriers to Discharge: Continued Medical Work up  Expected Discharge Plan and Services Expected Discharge Plan: Skilled Nursing Facility   Discharge Planning Services: CM Consult Post Acute Care Choice: Skilled Nursing Facility Living arrangements for the past 2 months: Mobile Home                 DME Arranged: N/A DME Agency: NA                   Social Determinants of Health (SDOH) Interventions    Readmission Risk Interventions No flowsheet data found.

## 2021-05-09 NOTE — Progress Notes (Signed)
Neurology Progress Note  Patient ID: Richard Carter is a 80 y.o. 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  Initially consulted for: Strokes Reconsulted for lethargy   Major interval events:  -Received 2 mg of Ativan at approximately 2 AM for concern for withdrawal, as well as a beer at about 10 PM last night -Additionally received mirtazapine 30 mg and trazodone 100 mg per home medication confirmed with caregiver -Per nursing report he was active and awake most of the night  Subjective: - Very sleepy but awakens and reports no complaints including no headache   Exam: Today's Vitals   05/09/21 1000 05/09/21 1100 05/09/21 1142 05/09/21 1153  BP: (!) 174/96 (!) 202/82 (!) 160/83   Pulse: (!) 46 60  65  Resp: 12 (!) 8  18  Temp: (!) 97.5 F (36.4 C) (!) 97.5 F (36.4 C)    TempSrc:  Axillary    SpO2: 100% 100%  100%  Weight:      Height:      PainSc: 0-No pain      Body mass index is 21.44 kg/m.  Gen: In bed, comfortable  Resp: non-labored breathing, no grossly audible wheezing Cardiac: Perfusing extremities well  Abd: soft, nt  Neuro: MS: Awakens with repeated gentle stimulation, falls asleep easily but when awake can follow commands, converses, report that he is in the hospital and has had a stroke affecting his left hand, remains disoriented to time as is his baseline CN: Pupils equal, mildly irregular at baseline, reactive to light 2 to 1.5 mm bilaterally, EOMI, facial sensation symmetric, face symmetric, tongue midline Motor: Weakness of the left hand but no drift of the bilateral upper extremities.  Able to sit on the edge of the bed and maintain truncal stability Sensory: Equally reactive to light touch in all 4 extremities DTR: 3+ and symmetric  biceps and patella  Pertinent Labs: Glu 78 ABG within normal limits Lab Results  Component Value Date   VITAMINB12 333 11/30/2020    Repeat head CT obtained today reviewed personally, agree with radiology that there is no acute intracranial process  Impression: Lethargic this morning suspect in the setting of day/night reversal given acute hospitalization as well as polypharmacy.  Exam is reassuring without any new cranial nerve deficits or other focal neurological deficits when patient is awake.  Additionally patient's mental status is improving with continued stimulation he is awakening more and more rather than less and less.    Recommendations: # Multifocal small strokes, secondary to known Afib off AC -LDL meeting goal of less than 70, continue home atorvastatin 20 mg -Eliquis 5 mg, BID to start 6/16 -Risk factor modification, patient agreeable to starting nicotine patches to aid in smoking cessation.  Additionally ordered as needed nicotine gum   #History of seizures -Continue home Keppra 500 mg twice daily -Patient gradually cutting back on alcohol with the help of his outpatient caregiver, continue plan   #History of dementia > Per outpatient neurology notes, donepezil/Aricept was discontinued on 01/03/2021 due to weight loss and decreased appetite with decrease in trazodone from 200 mg to 100 mg nightly and initiation of mirtazapine to aid with sleep as well as appetite -Resume home regimen, confirmed with caregiver, retimed to 8 PM instead of 10 PM today to improve sleeping at night and reduce daytime lethargy   #Difficulty with swallowing large pills -  SLP eval, consider change in formulation of home KLOR to packets  #Alcohol abuse history, lethargy today -Consider reducing pharmacological intensity of CIWAs protocol, defer to primary team -Retimed trazodone and mirtazapine as above -B12 level ordered, goal greater than 400 -Empiric B12 1000 mcg daily -Agree with folate and  thiamine supplementation   Brooke Dare MD-PhD [facility] [phone]  Greater than 35 minutes were spent in the care of this patient today, over which half was at bedside repeatedly examining the patient given his lethargy

## 2021-05-09 NOTE — Progress Notes (Signed)
OT Cancellation Note  Patient Details Name: Richard Carter MRN: [id number] DOB: 1941-07-26   Cancelled Treatment:    Reason Eval/Treat Not Completed: Fatigue/lethargy limiting ability to participate. Patient reviewed for planned treatment session.  Per chart review, patient lethargic and unable to participate with session at this time; MEWS recently changed to red.  Will hold OT at this time and re-attempt at later time/date as medically appropriate.  Rockney Ghee, M.S., OTR/L Ascom: [phone] 05/09/21, 11:41 AM

## 2021-05-09 NOTE — Progress Notes (Signed)
   05/09/21 1100  Assess: MEWS Score  Temp (!) 97.5 F (36.4 C)  BP (!) 202/82  Pulse Rate 60  Resp (!) 8  SpO2 100 %  Assess: MEWS Score  MEWS Temp 0  MEWS Systolic 2  MEWS Pulse 0  MEWS RR 1  MEWS LOC 1  MEWS Score 4  MEWS Score Color Red  Assess: if the MEWS score is Yellow or Red  Were vital signs taken at a resting state? Yes  Focused Assessment No change from prior assessment  Early Detection of Sepsis Score *See Row Information* Low  MEWS guidelines implemented *See Row Information* Yes  Treat  MEWS Interventions Administered scheduled meds/treatments  Notify: Charge Nurse/RN  Name of Charge Nurse/RN Notified ashley  Date Charge Nurse/RN Notified 05/09/21  Time Charge Nurse/RN Notified 1100  Notify: Provider  Provider Name/Title DR Georgeann Oppenheim  Date Provider Notified 05/09/21  Time Provider Notified 1100  Notification Type Call  Notification Reason Change in status  Provider response At bedside  Date of Provider Response 05/09/21  Time of Provider Response 1105  Notify: Rapid Response  Name of Rapid Response RN Notified  (no MD come directly to bedside)  Document  Patient Outcome Other (Comment) (will continue to monitor)  MD to bedside. Repeat head CT.

## 2021-05-10 ENCOUNTER — Inpatient Hospital Stay: Payer: Medicare HMO

## 2021-05-10 ENCOUNTER — Encounter: Payer: Self-pay | Admitting: Internal Medicine

## 2021-05-10 LAB — MAGNESIUM: Magnesium: 1.6 mg/dL — ABNORMAL LOW (ref 1.7–2.4)

## 2021-05-10 MED ORDER — MAGNESIUM SULFATE 2 GM/50ML IV SOLN
2.0000 g | Freq: Once | INTRAVENOUS | Status: AC
  Administered 2021-05-10: 2 g via INTRAVENOUS
  Filled 2021-05-10: qty 50

## 2021-05-10 NOTE — TOC Progression Note (Signed)
Transition of Care [facility]) - Progression Note    Patient Details  Name: Richard Carter MRN: [id number] Date of Birth: 1941-03-29  Transition of Care [facility]) CM/SW Contact  Hetty Ely, RN Phone Number: 05/10/2021, 3:52 PM  Clinical Narrative:  Authorization request faxed to [facility] [phone] for pending SNF [facility].     Expected Discharge Plan: Skilled Nursing Facility Barriers to Discharge: Continued Medical Work up  Expected Discharge Plan and Services Expected Discharge Plan: Skilled Nursing Facility   Discharge Planning Services: CM Consult Post Acute Care Choice: Skilled Nursing Facility Living arrangements for the past 2 months: Mobile Home                 DME Arranged: N/A DME Agency: NA                   Social Determinants of Health (SDOH) Interventions    Readmission Risk Interventions No flowsheet data found.

## 2021-05-10 NOTE — Progress Notes (Signed)
Neurology Progress Note  Patient ID: Richard Carter is a 80 y.o. 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  Initially consulted for: Strokes Reconsulted for lethargy   Major interval events:  - Did not receive trazodone or mirtazapine overnight due to drowsiness  - Reportedly slept well last night despite no sleep aids  - Able to wake enough to take oral medications today  Subjective: - complains of left thumb pain   Exam: Today's Vitals   05/09/21 2332 05/10/21 0034 05/10/21 0340 05/10/21 0755  BP: 126/80 (!) 122/93 (!) 167/88 138/88  Pulse: 67 65 61 65  Resp: 18 14 14    Temp: 97.8 F (36.6 C) 97.8 F (36.6 C) 97.7 F (36.5 C)   TempSrc: Axillary  Oral   SpO2: 98% 97% 100%   Weight:      Height:      PainSc:    0-No pain   Body mass index is 21.44 kg/m.  Gen: In bed, comfortable  Resp: non-labored breathing, no grossly audible wheezing Cardiac: Perfusing extremities well  Abd: soft, nt  Neuro: MS: Awakens with repeated gentle stimulation, falls asleep easily but when awake can follow commands, converses, report that he is in the hospital and has had a stroke affecting his left hand, remains disoriented to time as is his baseline CN: Pupils equal, mildly irregular at baseline, reactive to light 2 to 1.5 mm bilaterally, EOMI, facial sensation symmetric, face symmetric, tongue midline Motor: Limited participation today, reports fatigue after working with PT Sensory: Equally reactive to light touch in all 4 extremities DTR: 3+ and symmetric biceps and patella  Pertinent Labs: Glu 78 ABG within normal limits Lab Results  Component Value Date   VITAMINB12 192 05/09/2021     Initial stroke workup:  Lab Results  Component Value Date   CHOL  142 05/08/2021   HDL 98 05/08/2021   LDLCALC 34 05/08/2021   TRIG 51 05/08/2021   CHOLHDL 1.4 05/08/2021   Lab Results  Component Value Date   HGBA1C 5.2 05/08/2021    6/14 MRI brain personally reviewed, multifocal infarcts and a central embolic pattern most notably with involvement of the right cortical hand 6/14 MRA brain personally reviewed no large vessel occlusion or clinically significant stenosis Repeat head CT obtained 6/15 reviewed personally, agree with radiology that there is no acute intracranial process  Carotid duplex 6/14 1. Mild (1-49%) stenosis proximal right internal carotid artery secondary to irregular/ulcerated heterogeneous atherosclerotic plaque.  2. Mild (1-49%) stenosis proximal left internal carotid artery secondary to irregular/ulcerated heterogeneous atherosclerotic  plaque. 3. Vertebral arteries are patent with normal antegrade flow.    ECHO:  1. Left ventricular ejection fraction, by estimation, is 55 to 60%. The left ventricle has normal function. The left ventricle has no regional wall motion abnormalities. Left ventricular diastolic parameters were normal.   2. Right ventricular systolic function is normal. The right ventricular size is normal.   3. The mitral valve is grossly normal. No evidence of mitral valve regurgitation.   4. The aortic valve was not well visualized. Aortic valve regurgitation is not visualized.   Impression: Continues to be quite sleepy this morning though may be a bit improved from yesterday.  Exam is reassuring without any new cranial nerve deficits or other focal neurological deficits when patient is awake.  Already sedating medications tonight and continue to monitor  Recommendations: # Multifocal small strokes, secondary to known Afib off AC -LDL meeting goal of less than 70, continue home atorvastatin 20 mg -Eliquis 5 mg, BID to start 6/16 -Risk factor modification, patient agreeable to starting nicotine patches to aid in  smoking cessation.  Additionally ordered as needed nicotine gum   #History of seizures -Continue home Keppra 500 mg twice daily -Patient gradually cutting back on alcohol with the help of his outpatient caregiver, continue plan   #History of dementia > Per outpatient neurology notes, donepezil/Aricept was discontinued on 01/03/2021 due to weight loss and decreased appetite with decrease in trazodone from 200 mg to 100 mg nightly and initiation of mirtazapine to aid with sleep as well as appetite.  Received this home regimen on 6/14 evening since which time he has remained fairly sleepy though improving today. -I will give trazodone 100 mg nightly tonight only and discontinue mirtazapine for now   #Difficulty with swallowing large pills -SLP eval, consider change in formulation of home KLOR to packets  #Alcohol abuse history, lethargy  -Consider reducing pharmacological intensity of CIWAs protocol, defer to primary team -Continue trazodone 100 mg QHS, discontinued mirtazapine for tonight  -B12 level low normal and falling from prior to 192, goal greater than 400 -Continue Empiric B12 1000 mcg daily -Agree with folate and thiamine supplementation   Brooke Dare MD-PhD [facility] [phone]   Greater than 25 minutes were spent in the care of this patient today,

## 2021-05-10 NOTE — Progress Notes (Signed)
Physical Therapy Treatment Patient Details Name: NICHOLIS STEPANEK MRN: [id number] DOB: [birth year] Today's Date: 05/10/2021    History of Present Illness [name] is an 80 y.o. male with PMH including paroxysmal atrial fibrillation (not on anticoagulation due to history of medication noncompliance and increased risk of fall), historyof alcohol and nicotine dependence, history of vascular and Alzheimer's dementia who presents to the emergency room for evaluation of left arm weakness.  MRI significant for small acute infarcts in the right frontal parietal lobes, right posterior lentiform nucleus, left hippocampus and left cerebellum.  He will be admitted to the hospital for further evaluation.    PT Comments    Sleeping upon arrival to room, but awakens to mod touch/voice and washcloth to face.  Oriented to self, location; voices (and indicates) pain in L thumb.  Point tender over first metacarpal with noted erythema to area; RN informed/aware and to discuss imaging/gout work up with physician. Patient with noted increase in weakness, coordination deficits to L UE; unable to functionally grasp/release objects with L UE this date.  Increased weakness noted in L lateral trunk, and increased assist required for sit/stand, standing balance and gait efforts (5' laterally edge of bed), requiring consistent mod assist for all mobility tasks.  Poor standing balance; notably fatigued with very minimal activity, requesting return to bed end of session. Generally tremulous throughout whole body at times during session; calms with rest and accommodation to positions/activities.   Additionally, noted with mild difficulty managing oral secretions during session; SLP to follow up with patient this date. Goals from initial evaluation remain appropriate despite functional status change (noted no acute change on CT); continue to recommend STR at discharge.    Follow Up Recommendations  SNF     Equipment  Recommendations  Rolling walker with 5" wheels    Recommendations for Other Services       Precautions / Restrictions Precautions Precautions: Fall Restrictions Weight Bearing Restrictions: No    Mobility  Bed Mobility Overal bed mobility: Needs Assistance Bed Mobility: Supine to Sit;Sit to Supine     Supine to sit: Mod assist Sit to supine: Min assist   General bed mobility comments: increased assist for truncal elevation    Transfers Overall transfer level: Needs assistance Equipment used: Rolling walker (2 wheeled) Transfers: Sit to/from Stand Sit to Stand: Mod assist         General transfer comment: assist for lift off, balance and midline in both A/P, M/L planes.  Maintains very forward flexed posture, L lateral weight shift; difficulty maintaining L UE grasp on RW this date  Ambulation/Gait Ambulation/Gait assistance: Mod assist Gait Distance (Feet): 5 Feet Assistive device: Rolling walker (2 wheeled)       General Gait Details: laterally stepping edge of bed, mod assist; significant difficulty with weight shifting to unweight/advance LEs.  Poor balance reactions, poor activity tolerance. Unsafe to attempt without hands-on assist at all times.   Stairs             Wheelchair Mobility    Modified Rankin (Stroke Patients Only)       Balance Overall balance assessment: Needs assistance Sitting-balance support: No upper extremity supported;Feet supported Sitting balance-Leahy Scale: Fair Sitting balance - Comments: forward flexed posture, L lateral weight shift (L lateral trunk elongation, downward tilt L pelvis)   Standing balance support: Bilateral upper extremity supported Standing balance-Leahy Scale: Poor  Cognition   Behavior During Therapy: Flat affect Overall Cognitive Status: No family/caregiver present to determine baseline cognitive functioning                                  General Comments: oriented to self, location; follows commads; marked delay in processing/initiation; L inattention noted      Exercises Other Exercises Other Exercises: Unsupported sitting edge of bed, close sup for static sitting; cga/min assist for dynamic balance. Limited movement outside immediate BOS; sitting functional reach approx 2-3" from immediate BOS. Unable to functionally grasp/release with L UE during reaching tasks; requires max cuing, constant visual input for awareness and use of L UE.    General Comments        Pertinent Vitals/Pain Pain Assessment: Faces Faces Pain Scale: Hurts whole lot Pain Location: L thumb Pain Descriptors / Indicators: Aching;Guarding;Grimacing Pain Intervention(s): Limited activity within patient's tolerance;Monitored during session;Repositioned    Home Living                      Prior Function            PT Goals (current goals can now be found in the care plan section) Acute Rehab PT Goals Patient Stated Goal: to get some rest PT Goal Formulation: With patient Time For Goal Achievement: 05/22/21 Potential to Achieve Goals: Fair Progress towards PT goals: Not progressing toward goals - comment (requiring increased assist for all functional tasks this date)    Frequency    7X/week      PT Plan      Co-evaluation              AM-PAC PT "6 Clicks" Mobility   Outcome Measure  Help needed turning from your back to your side while in a flat bed without using bedrails?: A Little Help needed moving from lying on your back to sitting on the side of a flat bed without using bedrails?: A Lot Help needed moving to and from a bed to a chair (including a wheelchair)?: A Lot Help needed standing up from a chair using your arms (e.g., wheelchair or bedside chair)?: A Lot Help needed to walk in hospital room?: A Lot Help needed climbing 3-5 steps with a railing? : Total 6 Click Score: 12    End of Session Equipment  Utilized During Treatment: Gait belt Activity Tolerance: Patient limited by fatigue Patient left: in bed;with call bell/phone within reach;with bed alarm set Nurse Communication: Mobility status PT Visit Diagnosis: Muscle weakness (generalized) (M62.81);Difficulty in walking, not elsewhere classified (R26.2);Hemiplegia and hemiparesis Hemiplegia - Right/Left: Left Hemiplegia - dominant/non-dominant: Non-dominant Hemiplegia - caused by: Cerebral infarction     Time: 1020-1043 PT Time Calculation (min) (ACUTE ONLY): 23 min  Charges:  $Therapeutic Activity: 8-22 mins $Neuromuscular Re-education: 8-22 mins                     Major Santerre H. Manson Passey, PT, DPT, NCS 05/10/21, 11:02 AM [phone]

## 2021-05-10 NOTE — Progress Notes (Signed)
PROGRESS NOTE    WILLAIM MODE  ENI:[id number] DOB: [birth year] DOA: 05/07/2021 PCP: Marisue Ivan, MD   Brief Narrative:  80 y.o. male with medical history significant for paroxysmal atrial fibrillation not on anticoagulation due to history of medication noncompliance and increased risk of fall, historyof alcohol and nicotine dependence, history of vascular and Alzheimer's dementia who presents to the emergency room for evaluation of left arm weakness.  His neighbor states that his symptoms started between 4 and 6 PM, 2 days prior to his presentation to the ER.  He had seen him earlier in the day and patient was fine but when he went back to check on him at about 6 PM patient complained about weakness involving his left hand.  He was unable to pick up items with that hand but refused to come to the hospital to get checked.  He was finally able to convince him to come in on the day of admission. He denies having any chest pain, no shortness of breath, no headache, no difficulty swallowing, no dizziness, no lightheadedness, no fall, no blurred vision, no focal deficits, no fever, no chills, no urinary symptoms or changes in his bowel habits.  MRI imaging survey consistent with acute CVA.  Likely etiology embolic.  Seen by neurology.  Outside the window for any tPA.  Recommend symptom management and rehabilitation.  Patient essentially medically ready for discharge at this time.  He is very high risk for withdrawal so after conversations with: Inpatient pharmacy we were able to acquire beer while admitted.    Patient is been relatively stable.  On 6/14 p.m. became somewhat agitated and received 2 mg p.o. Ativan.  Was lethargic the next morning and difficult to arouse.  Communicated with neurology and pursued stat CT head  .  No acute findings.  Likely representative of polypharmacy.  No further benzodiazepines administered and while mental status still remains somewhat poor believe this to be  baseline.  Patient agreed to skilled nursing facility placement and can discharge once bed excepted and insurance authorization obtained.  Assessment & Plan:   Principal Problem:   Acute CVA (cerebrovascular accident) (HCC) Active Problems:   Seizure (HCC)   HTN (hypertension)   Alcohol abuse   Non compliance w medication regimen   Dementia (HCC)   AF (paroxysmal atrial fibrillation) (HCC)  Acute CVA Likely embolic etiology.  Patient has a history of paroxysmal atrial fibrillation.  No anticoagulation due to history of medication nonadherence and increased fall likelihood. Seen by neurology who added Eliquis 5 mg twice daily Plan:  Continue Eliquis 5 mg twice daily Continue home atorvastatin 20 mg daily Normotensive blood pressure goal Stable for discharge to skilled nursing facility   History of nicotine dependence Smoking cessation has been discussed with patient in detail Continue NicoDerm patch   Seizure disorder Keppra Seizure precautions   Dementia Mixed (vascular and Alzheimer dementia) Continue Aricept, Lexapro and trazodone   History of alcohol abuse Patient is not interested in quitting drinking.  He is high risk for development of withdrawals.  That being said he has not exhibited any specific withdrawals while in house.  We have been able to order some beer while admitted to prophylax against development of withdrawal. Plan: Daily multivitamin, thiamine, folic acid Avoid Ativan  Hypertension Home regimen Cozaar 25 mg daily, Toprol-XL 50 mg daily BP agents been restarted  History of paroxysmal atrial fibrillation Alternates between sinus rhythm and atrial fibrillation.  Has been relatively well rate controlled.  Eliquis  started per neurology recommendations. Plan: Continue Eliquis 5 twice daily Continue Toprol succinate 50 mg daily  DVT prophylaxis: SCDs Code Status: DNR Family Communication: Neighbor/caregiver at bedside.  Sister via phone on  6/14 Disposition Plan: Status is: Inpatient  Remains inpatient appropriate because:Unsafe d/c plan  Dispo: The patient is from: Home              Anticipated d/c is to: SNF              Patient currently is medically stable to d/c.   Difficult to place patient No  Acute CVA.  Medically ready for discharge at this time.  Pending skilled nursing facility placement.  TOC aware.             Level of care: Med-Surg  Consultants:  Neurology  Procedures:  None  Antimicrobials:  None   Subjective: Patient seen and examined.  Resting comfortably bed.  No visible distress.  Objective: Vitals:   05/09/21 2332 05/10/21 0034 05/10/21 0340 05/10/21 0755  BP: 126/80 (!) 122/93 (!) 167/88 138/88  Pulse: 67 65 61 65  Resp: 18 14 14    Temp: 97.8 F (36.6 C) 97.8 F (36.6 C) 97.7 F (36.5 C)   TempSrc: Axillary  Oral   SpO2: 98% 97% 100%   Weight:      Height:        Intake/Output Summary (Last 24 hours) at 05/10/2021 1155 Last data filed at 05/10/2021 1024 Gross per 24 hour  Intake 240 ml  Output --  Net 240 ml   Filed Weights   05/07/21 0824  Weight: 62.1 kg    Examination:  General exam: No acute distress.  Appears frail and chronically ill Respiratory system: Lungs clear.  Normal work of breathing.  Room air Cardiovascular system: S1-S2, regular rate and rhythm, no murmurs Gastrointestinal system: Abdomen is nondistended, soft and nontender. No organomegaly or masses felt. Normal bowel sounds heard. Central nervous system: Alert, oriented x2, no focal deficits Extremities: Diffusely decreased power.  Gait not assessed Skin: No rashes, lesions or ulcers Psychiatry: Judgement and insight appear impaired. Mood & affect flattened.     Data Reviewed: I have personally reviewed following labs and imaging studies  CBC: Recent Labs  Lab 05/07/21 0900 05/09/21 0858  WBC 6.9 7.1  NEUTROABS 3.9 4.2  HGB 12.4* 12.3*  HCT 35.7* 35.5*  MCV 104.7* 104.4*   PLT 183 194   Basic Metabolic Panel: Recent Labs  Lab 05/07/21 0900 05/09/21 0858 05/10/21 0519  NA 139 136  --   K 3.8 3.8  --   CL 104 100  --   CO2 27 22  --   GLUCOSE 109* 86  --   BUN 19 23  --   CREATININE 1.22 1.00  --   CALCIUM 9.9 10.1  --   MG 1.6*  --  1.6*  PHOS 2.9  --   --    GFR: Estimated Creatinine Clearance: 50.9 mL/min (by C-G formula based on SCr of 1 mg/dL). Liver Function Tests: Recent Labs  Lab 05/07/21 0900  AST 54*  ALT 26  ALKPHOS 75  BILITOT 1.4*  PROT 7.0  ALBUMIN 3.8   No results for input(s): LIPASE, AMYLASE in the last 168 hours. No results for input(s): AMMONIA in the last 168 hours. Coagulation Profile: Recent Labs  Lab 05/07/21 0900  INR 0.9   Cardiac Enzymes: No results for input(s): CKTOTAL, CKMB, CKMBINDEX, TROPONINI in the last 168 hours. BNP (last 3  results) No results for input(s): PROBNP in the last 8760 hours. HbA1C: Recent Labs    05/08/21 0513  HGBA1C 5.2   CBG: Recent Labs  Lab 05/09/21 1135  GLUCAP 78   Lipid Profile: Recent Labs    05/08/21 0513  CHOL 142  HDL 98  LDLCALC 34  TRIG 51  CHOLHDL 1.4   Thyroid Function Tests: No results for input(s): TSH, T4TOTAL, FREET4, T3FREE, THYROIDAB in the last 72 hours. Anemia Panel: Recent Labs    05/09/21 0901  VITAMINB12 192   Sepsis Labs: No results for input(s): PROCALCITON, LATICACIDVEN in the last 168 hours.  Recent Results (from the past 240 hour(s))  Resp Panel by RT-PCR (Flu A&B, Covid) Nasopharyngeal Swab     Status: None   Collection Time: 05/07/21 10:50 AM   Specimen: Nasopharyngeal Swab; Nasopharyngeal(NP) swabs in vial transport medium  Result Value Ref Range Status   SARS Coronavirus 2 by RT PCR NEGATIVE NEGATIVE Final    Comment: (NOTE) SARS-CoV-2 target nucleic acids are NOT DETECTED.  The SARS-CoV-2 RNA is generally detectable in upper respiratory specimens during the acute phase of infection. The lowest concentration of  SARS-CoV-2 viral copies this assay can detect is 138 copies/mL. A negative result does not preclude SARS-Cov-2 infection and should not be used as the sole basis for treatment or other patient management decisions. A negative result may occur with  improper specimen collection/handling, submission of specimen other than nasopharyngeal swab, presence of viral mutation(s) within the areas targeted by this assay, and inadequate number of viral copies(<138 copies/mL). A negative result must be combined with clinical observations, patient history, and epidemiological information. The expected result is Negative.  Fact Sheet for Patients:  BloggerCourse.com  Fact Sheet for Healthcare Providers:  SeriousBroker.it  This test is no t yet approved or cleared by the [place] FDA and  has been authorized for detection and/or diagnosis of SARS-CoV-2 by FDA under an Emergency Use Authorization (EUA). This EUA will remain  in effect (meaning this test can be used) for the duration of the COVID-19 declaration under Section 564(b)(1) of the Act, 21 U.S.C.section 360bbb-3(b)(1), unless the authorization is terminated  or revoked sooner.       Influenza A by PCR NEGATIVE NEGATIVE Final   Influenza B by PCR NEGATIVE NEGATIVE Final    Comment: (NOTE) The Xpert Xpress SARS-CoV-2/FLU/RSV plus assay is intended as an aid in the diagnosis of influenza from Nasopharyngeal swab specimens and should not be used as a sole basis for treatment. Nasal washings and aspirates are unacceptable for Xpert Xpress SARS-CoV-2/FLU/RSV testing.  Fact Sheet for Patients: BloggerCourse.com  Fact Sheet for Healthcare Providers: SeriousBroker.it  This test is not yet approved or cleared by the [place] FDA and has been authorized for detection and/or diagnosis of SARS-CoV-2 by FDA under an Emergency Use  Authorization (EUA). This EUA will remain in effect (meaning this test can be used) for the duration of the COVID-19 declaration under Section 564(b)(1) of the Act, 21 U.S.C. section 360bbb-3(b)(1), unless the authorization is terminated or revoked.  Performed at [facility], [address], [place], [place] [address]          Radiology Studies: CT HEAD WO CONTRAST  Result Date: 05/09/2021 CLINICAL DATA:  Worsening lethargy today. Atrial fibrillation. Recent acute infarctions scattered throughout the brain. EXAM: CT HEAD WITHOUT CONTRAST TECHNIQUE: Contiguous axial images were obtained from the base of the skull through the vertex without intravenous contrast. COMPARISON:  MRI 05/07/2021 and  head CT 05/07/2021. FINDINGS: Brain: Chronic small-vessel ischemic changes affect the pons. No focal cerebellar abnormality visible by CT. Small acute infarction in the inferior cerebellum on the left shown by MRI is not visible. Cerebral hemispheres show generalized atrophy with chronic small-vessel ischemic changes of the white matter. Small acute infarctions scattered within both hemispheres are not visible by CT. No evidence of larger extension. No mass, hemorrhage, hydrocephalus or extra-axial collection. Vascular: There is atherosclerotic calcification of the major vessels at the base of the brain. Skull: Negative Sinuses/Orbits: Clear/normal Other: None IMPRESSION: No acute finding by CT. Atrophy and chronic small-vessel ischemic changes. Small acute infarctions within the cerebellum and cerebral hemispheres shown by MRI 2 days ago are not specifically visible by CT. No evidence extension of the infarctions or any hemorrhage or mass effect. Electronically Signed   By: Paulina FusiMark  Shogry M.D.   On: 05/09/2021 12:35   DG Finger Thumb Left  Result Date: 05/10/2021 CLINICAL DATA:  Left thumb pain and swelling EXAM: LEFT THUMB 2+V COMPARISON:  None. FINDINGS: Three view radiograph left thumb  demonstrates normal alignment. No fracture or dislocation. There is moderate degenerative arthritis of the first [facility] at the base of the thumb. Mild degenerate arthritis of the interphalangeal joint of the thumb. Soft tissues are unremarkable. IMPRESSION: Moderate degenerative arthritis of the first [facility] at the base of the thumb. Electronically Signed   By: Helyn NumbersAshesh  Parikh MD   On: 05/10/2021 11:37        Scheduled Meds:  apixaban  5 mg Oral BID   atorvastatin  20 mg Oral QHS   folic acid  1 mg Oral Daily   levETIRAcetam  500 mg Oral BID   LORazepam  0-4 mg Oral Q12H   losartan  25 mg Oral Daily   magnesium oxide  400 mg Oral Daily   metoprolol succinate  50 mg Oral Daily   multivitamin with minerals  1 tablet Oral Daily   nicotine  14 mg Transdermal Daily   pantoprazole  40 mg Oral Daily   sodium chloride flush  3 mL Intravenous Once   thiamine  100 mg Oral Daily   Or   thiamine  100 mg Intravenous Daily   traZODone  100 mg Oral QHS   vitamin B-12  1,000 mcg Oral Daily   Continuous Infusions:   LOS: 1 day    Time spent: 15 minutes    Tresa MooreSudheer B Emmett Bracknell, MD [facility] Pager [phone]  If 7PM-7AM, please contact night-coverage 05/10/2021, 11:55 AM

## 2021-05-10 NOTE — Progress Notes (Signed)
OT Cancellation Note  Patient Details Name: Richard Carter MRN: [id number] DOB: 03/27/1941   Cancelled Treatment:    Reason Eval/Treat Not Completed: Fatigue/lethargy limiting ability to participate. Upon attempt, pt sleeping very soundly. New CT scan pending. Will hold OT and re-attempt next date as appropriate.   Wynona Canes, MPH, MS, OTR/L ascom [phone] 05/10/21, 4:32 PM

## 2021-05-11 ENCOUNTER — Inpatient Hospital Stay: Payer: Medicare HMO

## 2021-05-11 DIAGNOSIS — G309 Alzheimer's disease, unspecified: Secondary | ICD-10-CM | POA: Diagnosis not present

## 2021-05-11 DIAGNOSIS — F1010 Alcohol abuse, uncomplicated: Secondary | ICD-10-CM | POA: Diagnosis not present

## 2021-05-11 DIAGNOSIS — I10 Essential (primary) hypertension: Secondary | ICD-10-CM | POA: Diagnosis not present

## 2021-05-11 DIAGNOSIS — Z789 Other specified health status: Secondary | ICD-10-CM | POA: Diagnosis not present

## 2021-05-11 DIAGNOSIS — Z8673 Personal history of transient ischemic attack (TIA), and cerebral infarction without residual deficits: Secondary | ICD-10-CM | POA: Diagnosis not present

## 2021-05-11 DIAGNOSIS — I69391 Dysphagia following cerebral infarction: Secondary | ICD-10-CM | POA: Diagnosis not present

## 2021-05-11 DIAGNOSIS — R569 Unspecified convulsions: Secondary | ICD-10-CM | POA: Diagnosis not present

## 2021-05-11 DIAGNOSIS — R404 Transient alteration of awareness: Secondary | ICD-10-CM | POA: Diagnosis not present

## 2021-05-11 DIAGNOSIS — I6930 Unspecified sequelae of cerebral infarction: Secondary | ICD-10-CM | POA: Diagnosis not present

## 2021-05-11 DIAGNOSIS — I639 Cerebral infarction, unspecified: Secondary | ICD-10-CM | POA: Diagnosis not present

## 2021-05-11 DIAGNOSIS — I69354 Hemiplegia and hemiparesis following cerebral infarction affecting left non-dominant side: Secondary | ICD-10-CM | POA: Diagnosis not present

## 2021-05-11 DIAGNOSIS — F0390 Unspecified dementia without behavioral disturbance: Secondary | ICD-10-CM | POA: Diagnosis not present

## 2021-05-11 DIAGNOSIS — Z87891 Personal history of nicotine dependence: Secondary | ICD-10-CM | POA: Diagnosis not present

## 2021-05-11 DIAGNOSIS — I480 Paroxysmal atrial fibrillation: Secondary | ICD-10-CM | POA: Diagnosis not present

## 2021-05-11 DIAGNOSIS — G4089 Other seizures: Secondary | ICD-10-CM | POA: Diagnosis not present

## 2021-05-11 DIAGNOSIS — F0280 Dementia in other diseases classified elsewhere without behavioral disturbance: Secondary | ICD-10-CM | POA: Diagnosis not present

## 2021-05-11 DIAGNOSIS — R918 Other nonspecific abnormal finding of lung field: Secondary | ICD-10-CM | POA: Diagnosis not present

## 2021-05-11 DIAGNOSIS — M2550 Pain in unspecified joint: Secondary | ICD-10-CM | POA: Diagnosis not present

## 2021-05-11 DIAGNOSIS — R2681 Unsteadiness on feet: Secondary | ICD-10-CM | POA: Diagnosis not present

## 2021-05-11 DIAGNOSIS — Z7401 Bed confinement status: Secondary | ICD-10-CM | POA: Diagnosis not present

## 2021-05-11 DIAGNOSIS — R270 Ataxia, unspecified: Secondary | ICD-10-CM | POA: Diagnosis not present

## 2021-05-11 DIAGNOSIS — F0150 Vascular dementia without behavioral disturbance: Secondary | ICD-10-CM | POA: Diagnosis not present

## 2021-05-11 DIAGNOSIS — M6281 Muscle weakness (generalized): Secondary | ICD-10-CM | POA: Diagnosis not present

## 2021-05-11 LAB — COMPREHENSIVE METABOLIC PANEL
ALT: 21 U/L (ref 0–44)
AST: 31 U/L (ref 15–41)
Albumin: 3.6 g/dL (ref 3.5–5.0)
Alkaline Phosphatase: 65 U/L (ref 38–126)
Anion gap: 9 (ref 5–15)
BUN: 32 mg/dL — ABNORMAL HIGH (ref 8–23)
CO2: 23 mmol/L (ref 22–32)
Calcium: 9.8 mg/dL (ref 8.9–10.3)
Chloride: 102 mmol/L (ref 98–111)
Creatinine, Ser: 1.16 mg/dL (ref 0.61–1.24)
GFR, Estimated: >60 mL/min (ref >60)
Glucose, Bld: 106 mg/dL — ABNORMAL HIGH (ref 70–99)
Potassium: 4 mmol/L (ref 3.5–5.1)
Sodium: 134 mmol/L — ABNORMAL LOW (ref 135–145)
Total Bilirubin: 0.9 mg/dL (ref 0.3–1.2)
Total Protein: 7.3 g/dL (ref 6.5–8.1)

## 2021-05-11 LAB — CBC WITH DIFFERENTIAL/PLATELET
Abs Immature Granulocytes: 0.06 10*3/uL (ref 0.00–0.07)
Basophils Absolute: 0 10*3/uL (ref 0.0–0.1)
Basophils Relative: 0 %
Eosinophils Absolute: 0.2 10*3/uL (ref 0.0–0.5)
Eosinophils Relative: 2 %
HCT: 37.1 % — ABNORMAL LOW (ref 39.0–52.0)
Hemoglobin: 13.1 g/dL (ref 13.0–17.0)
Immature Granulocytes: 1 %
Lymphocytes Relative: 19 %
Lymphs Abs: 2 10*3/uL (ref 0.7–4.0)
MCH: 36.7 pg — ABNORMAL HIGH (ref 26.0–34.0)
MCHC: 35.3 g/dL (ref 30.0–36.0)
MCV: 103.9 fL — ABNORMAL HIGH (ref 80.0–100.0)
Monocytes Absolute: 1.4 10*3/uL — ABNORMAL HIGH (ref 0.1–1.0)
Monocytes Relative: 13 %
Neutro Abs: 6.8 10*3/uL (ref 1.7–7.7)
Neutrophils Relative %: 65 %
Platelets: 208 10*3/uL (ref 150–400)
RBC: 3.57 MIL/uL — ABNORMAL LOW (ref 4.22–5.81)
RDW: 18.6 % — ABNORMAL HIGH (ref 11.5–15.5)
WBC: 10.3 10*3/uL (ref 4.0–10.5)
nRBC: 0 % (ref 0.0–0.2)

## 2021-05-11 LAB — RESP PANEL BY RT-PCR (FLU A&B, COVID) ARPGX2
Influenza A by PCR: NEGATIVE
Influenza B by PCR: NEGATIVE
SARS Coronavirus 2 by RT PCR: NEGATIVE

## 2021-05-11 LAB — AMMONIA: Ammonia: 14 umol/L (ref 9–35)

## 2021-05-11 LAB — TSH: TSH: 2.264 u[IU]/mL (ref 0.350–4.500)

## 2021-05-11 LAB — MAGNESIUM: Magnesium: 1.9 mg/dL (ref 1.7–2.4)

## 2021-05-11 MED ORDER — AMLODIPINE BESYLATE 5 MG PO TABS: 5.0000 mg | ORAL_TABLET | Freq: Every day | ORAL | Status: DC

## 2021-05-11 MED ORDER — AMLODIPINE BESYLATE 5 MG PO TABS
5.0000 mg | ORAL_TABLET | Freq: Every day | ORAL | Status: DC
  Administered 2021-05-11: 5 mg via ORAL
  Filled 2021-05-11: qty 1

## 2021-05-11 MED ORDER — NICOTINE 14 MG/24HR TD PT24: 14.0000 mg | MEDICATED_PATCH | Freq: Every day | TRANSDERMAL | 0 refills | Status: DC

## 2021-05-11 MED ORDER — TRAZODONE HCL 100 MG PO TABS: 100.0000 mg | ORAL_TABLET | Freq: Every day | ORAL | Status: DC

## 2021-05-11 MED ORDER — APIXABAN 5 MG PO TABS: 5.0000 mg | ORAL_TABLET | Freq: Two times a day (BID) | ORAL | Status: DC

## 2021-05-11 NOTE — Progress Notes (Signed)
Speech Language Pathology Treatment: Dysphagia  Patient Details Name: Richard Carter MRN: [id number] DOB: 1940/12/11 Today's Date: 05/11/2021 Time: 0920-1005 SLP Time Calculation (min) (ACUTE ONLY): 45 min  Assessment / Plan / Recommendation Clinical Impression  Pt seen today for ongoing assessment of swallowing; toleration of oral diet. Pt had a decline in status/function w/ f/u w/ Head CT yesterday; more lethargic. This SLP downgraded pt's diet to a Dysphagia level 1 consistency w/ strict aspiration precautions yesterday d/t the lethargy. Today, he is alert/awake and sitting EOB feeding self breakfast meal. Noted a congested cough at Baseline. Head CT was negative for new event.  Pt has a Baseline of Dementia, Cognitive decline. Pt also has No bottom Dentition baseline. Oral care provided w/ cleaning Denture plate.  OF NOTE, pt had completely eaten ALL of the Puree foods on the tray from breakfast meal. Pt stated it was "easier" food to eat.   W/ po's at meal, he appears to present w/ oropharyngeal phase dysphagia w/ impact from declined Cognitive status, baseline Dementia, impacting his overall bolus management during po trials as well as impacting pharyngeal swallowing (suspected); this increases risk for aspiration. Pt's risk for aspiration is present but can be reduced when following general aspiration precautions and using a modified diet. He requires min++ verbal/visual cues for follow through w/ precautions. Pt was instructed on, and given, a Cup for drinking of liquids to aid oropharyngeal control. He consumed trials of thin liquids via Cup w/ delayed and immediate overt coughing (congested). W/ trials of Nectar liquids and purees, no further overt clinical s/s of aspiration noted; no decline in phonations, no cough, and no decline in respiratory status during/post trials. Oral phase was grossly [facility] for bolus management of purees and Nectar liqids; min loss w/ thin liquids while sipping.  Oral clearing achieved w/ boluses given, BUT he required min increased Time for full bolus manipulation and clearing.  Discussed diet consistency of foods/thickened liquids; aspiration precautions; pills Crushed in puree; feeding support and supervision w/ pt/NSG.   Recommend continue a Dysphagia level 1(puree) diet  now w/ Nectar liquids; general aspiration precautions; reduce Distractions during meals. Pills Crushed in Puree for safer swallowing. Montioring at meals for s/s of aspiration. Frequent oral care, denture care. Pt should f/u w/ skilled ST services at Rehab w/ ongoing Dysphagia services and a MBSS when pt's medical status has improved OVERALL indicating improved oropharyngeal swallowing (distance from acute illness. NSG/SW updated.        HPI HPI: Per admitting H&P:  "Richard Carter is a 80 y.o. male with medical history significant for paroxysmal atrial fibrillation not on anticoagulation due to history of medication noncompliance and increased risk of fall, historyof alcohol and nicotine dependence, history of vascular and Alzheimer's dementia who presents to the emergency room for evaluation of left arm weakness.  His neighbor states that his symptoms started between 4 and 6 PM, 2 days prior to his presentation to the ER.  He had seen him earlier in the day and patient was fine but when he went back to check on him at about 6 PM patient complained about weakness involving his left hand.  He was unable to pick up items with that hand but refused to come to the hospital to get checked.  He was finally able to convince him to come in on the day of admission.  He denies having any chest pain, no shortness of breath, no headache, no difficulty swallowing, no dizziness, no lightheadedness, no fall,  no blurred vision, no focal deficits, no fever, no chills, no urinary symptoms or changes in his bowel habits.     CT scan of the head without contrast shows no acute findings.  Generalized atrophy  with chronic small vessel ischemia.  MRI of the brain without contrast showed Small acute infarcts in the right frontoparietal lobes, right posterior lentiform nucleus, left hippocampus, and left cerebellum. There is notable involvement of the right hand motor region. Chronic infarcts and chronic microvascular ischemic changes as seen  previously. Few chronic microhemorrhages secondary to hypertension and/or amyloid angiopathy.".   CXR on 05/11/2020: "Airspace opacity medial left base consistent with pneumonia or  possible aspiration. Lungs elsewhere clear.".      SLP Plan  Continue with current plan of care       Recommendations  Diet recommendations: Dysphagia 1 (puree);Nectar-thick liquid Liquids provided via: Cup;No straw Medication Administration: Whole meds with puree (vs need to Crush in Puree) Supervision: Patient able to self feed (tray setup and monitoring) Compensations: Minimize environmental distractions;Slow rate;Small sips/bites;Lingual sweep for clearance of pocketing;Follow solids with liquid Postural Changes and/or Swallow Maneuvers: Out of bed for meals;Seated upright 90 degrees;Upright 30-60 min after meal                General recommendations:  (Dietician f/u) Oral Care Recommendations: Oral care BID;Oral care before and after PO;Staff/trained caregiver to provide oral care (Denture care) Follow up Recommendations: Skilled Nursing facility (mbss tbd) SLP Visit Diagnosis: Dysphagia, oropharyngeal phase (R13.12) (baseline Dementia) Plan: Continue with current plan of care       GO                 Jerilynn Som, MS, CCC-SLP Speech Language Pathologist Rehab Services [phone] [facility] 05/11/2021, 3:21 PM

## 2021-05-11 NOTE — Discharge Summary (Addendum)
Physician Discharge Summary  JOREN REHM ZOX:[id number] DOB: 1941/04/17 DOA: 05/07/2021  PCP: Marisue Ivan, MD  Admit date: 05/07/2021 Discharge date: 05/11/2021  Admitted From: Home Disposition: SNF  Recommendations for Outpatient Follow-up:  Follow up with PCP in 1-2 weeks Please follow-up with neurology in 1 week  Home Health: No Equipment/Devices: None  Discharge Condition: Stable CODE STATUS: DNR Diet recommendation: Dysphagia.  Dysphagia level 1 (PUREE) w/ gravies added to moisten; NECTAR consistency liquids VIA CUP.  General aspiration precautions.  PILLS in a PUREE.  Tray setup at meals.   Brief/Interim Summary: 80 y.o. male with medical history significant for paroxysmal atrial fibrillation not on anticoagulation due to history of medication noncompliance and increased risk of fall, historyof alcohol and nicotine dependence, history of vascular and Alzheimer's dementia who presents to the emergency room for evaluation of left arm weakness.  His neighbor states that his symptoms started between 4 and 6 PM, 2 days prior to his presentation to the ER.  He had seen him earlier in the day and patient was fine but when he went back to check on him at about 6 PM patient complained about weakness involving his left hand.  He was unable to pick up items with that hand but refused to come to the hospital to get checked.  He was finally able to convince him to come in on the day of admission. He denies having any chest pain, no shortness of breath, no headache, no difficulty swallowing, no dizziness, no lightheadedness, no fall, no blurred vision, no focal deficits, no fever, no chills, no urinary symptoms or changes in his bowel habits.   MRI imaging survey consistent with acute CVA.  Likely etiology embolic.  Seen by neurology.  Outside the window for any tPA.  Recommend symptom management and rehabilitation.  Patient essentially medically ready for discharge at this time.  He is very  high risk for withdrawal so after conversations with: Inpatient pharmacy we were able to acquire beer while admitted.     Patient is been relatively stable.  On 6/14 p.m. became somewhat agitated and received 2 mg p.o. Ativan.  Was lethargic the next morning and difficult to arouse.  Communicated with neurology and pursued stat CT head   .  No acute findings.  Likely representative of polypharmacy.  No further benzodiazepines administered and while mental status still remains somewhat poor believe this to be baseline.  Patient agreed to skilled nursing facility placement and can discharge once bed excepted and insurance authorization obtained.  On day of discharge patient remained somewhat lethargic but is hemodynamically stable.  No metabolic derangements identified.  I suspect the patient's baseline is rather poor.  He will benefit from short-term rehab and skilled nursing prior to returning back home.  Stable for discharge to skilled nursing facility at this time.  Will need follow-up with primary care and neurology after discharge.  Discharge Diagnoses:  Principal Problem:   Acute CVA (cerebrovascular accident) (HCC) Active Problems:   Seizure (HCC)   HTN (hypertension)   Alcohol abuse   Non compliance w medication regimen   Dementia (HCC)   AF (paroxysmal atrial fibrillation) (HCC)  Acute CVA Likely embolic etiology.  Patient has a history of paroxysmal atrial fibrillation.  No anticoagulation due to history of medication nonadherence and increased fall likelihood. Seen by neurology who added Eliquis 5 mg twice daily Plan: Continue Eliquis 5 mg twice daily Continue home atorvastatin 20 mg daily Normotensive blood pressure goal Stable for discharge to  skilled nursing facility    History of nicotine dependence Smoking cessation has been discussed with patient in detail Continue NicoDerm patch   Seizure disorder Keppra Seizure precautions   Dementia Mixed (vascular and  Alzheimer dementia) Continue Aricept, Lexapro and trazodone   History of alcohol abuse Patient is not interested in quitting drinking.  He is high risk for development of withdrawals.  That being said he has not exhibited any specific withdrawals while in house.  We have been able to order some beer while admitted to prophylax against development of withdrawal.  At time of discharge patient is not scoring on CIWA and not exhibiting any clinical signs of withdrawal   Hypertension Home regimen Cozaar 25 mg daily, Toprol-XL 50 mg daily BP agents been restarted   History of paroxysmal atrial fibrillation Alternates between sinus rhythm and atrial fibrillation.  Has been relatively well rate controlled.  Eliquis started per neurology recommendations. Plan: Continue Eliquis 5 twice daily Continue Toprol succinate 50 mg daily  Discharge Instructions  Discharge Instructions     Diet - low sodium heart healthy   Complete by: As directed    Increase activity slowly   Complete by: As directed       Allergies as of 05/11/2021   No Known Allergies      Medication List     STOP taking these medications    aspirin EC 81 MG tablet   donepezil 10 MG tablet Commonly known as: ARICEPT   escitalopram 10 MG tablet Commonly known as: LEXAPRO   mirtazapine 15 MG tablet Commonly known as: REMERON   potassium chloride SA 20 MEQ tablet Commonly known as: KLOR-CON       TAKE these medications    amLODipine 5 MG tablet Commonly known as: NORVASC Take 1 tablet (5 mg total) by mouth daily. Start taking on: May 12, 2021   apixaban 5 MG Tabs tablet Commonly known as: ELIQUIS Take 1 tablet (5 mg total) by mouth 2 (two) times daily.   atorvastatin 20 MG tablet Commonly known as: LIPITOR Take 20 mg by mouth daily.   levETIRAcetam 500 MG tablet Commonly known as: KEPPRA Take 1 tablet (500 mg total) by mouth 2 (two) times daily.   losartan 25 MG tablet Commonly known as:  COZAAR Take 25 mg by mouth daily.   magnesium oxide 400 MG tablet Commonly known as: MAG-OX Take 400 mg by mouth daily.   metoprolol succinate 50 MG 24 hr tablet Commonly known as: TOPROL-XL Take 1 tablet (50 mg total) by mouth daily. What changed: how much to take   nicotine 14 mg/24hr patch Commonly known as: NICODERM CQ - dosed in mg/24 hours Place 1 patch (14 mg total) onto the skin daily. Start taking on: May 12, 2021   omeprazole 40 MG capsule Commonly known as: PRILOSEC Take 40 mg by mouth daily.   traZODone 100 MG tablet Commonly known as: DESYREL Take 1 tablet (100 mg total) by mouth at bedtime. What changed: how much to take        Contact information for after-discharge care     Destination     [facility] .   Service: Skilled Nursing Contact information: [address] [place] [address] [phone]                    No Known Allergies  Consultations: Neurology   Procedures/Studies: CT HEAD WO CONTRAST  Result Date: 05/10/2021 CLINICAL DATA:  Delirium  EXAM: CT HEAD WITHOUT CONTRAST TECHNIQUE: Contiguous axial images were obtained from the base of the skull through the vertex without intravenous contrast. COMPARISON:  CT head 05/09/2021 FINDINGS: Brain: Moderate to advanced atrophy. Ventricular enlargement due to atrophy. Negative for acute infarct, hemorrhage, mass. Mild chronic microvascular ischemic change in the white matter and pons. Vascular: Atherosclerotic calcification in the carotid and vertebral arteries bilaterally. Negative for hyperdense vessel Skull: Negative Sinuses/Orbits: Mild mucosal edema paranasal sinuses. Negative orbit Other: None IMPRESSION: No acute abnormality and no change from the recent study yesterday. Extensive atrophy with mild chronic microvascular ischemia. Electronically Signed   By: Marlan Palau M.D.   On: 05/10/2021 18:13   CT HEAD WO CONTRAST  Result  Date: 05/09/2021 CLINICAL DATA:  Worsening lethargy today. Atrial fibrillation. Recent acute infarctions scattered throughout the brain. EXAM: CT HEAD WITHOUT CONTRAST TECHNIQUE: Contiguous axial images were obtained from the base of the skull through the vertex without intravenous contrast. COMPARISON:  MRI 05/07/2021 and head CT 05/07/2021. FINDINGS: Brain: Chronic small-vessel ischemic changes affect the pons. No focal cerebellar abnormality visible by CT. Small acute infarction in the inferior cerebellum on the left shown by MRI is not visible. Cerebral hemispheres show generalized atrophy with chronic small-vessel ischemic changes of the white matter. Small acute infarctions scattered within both hemispheres are not visible by CT. No evidence of larger extension. No mass, hemorrhage, hydrocephalus or extra-axial collection. Vascular: There is atherosclerotic calcification of the major vessels at the base of the brain. Skull: Negative Sinuses/Orbits: Clear/normal Other: None IMPRESSION: No acute finding by CT. Atrophy and chronic small-vessel ischemic changes. Small acute infarctions within the cerebellum and cerebral hemispheres shown by MRI 2 days ago are not specifically visible by CT. No evidence extension of the infarctions or any hemorrhage or mass effect. Electronically Signed   By: Paulina Fusi M.D.   On: 05/09/2021 12:35   CT HEAD WO CONTRAST  Result Date: 05/07/2021 CLINICAL DATA:  Left arm weakness since Saturday EXAM: CT HEAD WITHOUT CONTRAST TECHNIQUE: Contiguous axial images were obtained from the base of the skull through the vertex without intravenous contrast. COMPARISON:  02/22/2020 FINDINGS: Brain: No evidence of acute infarction, hemorrhage, hydrocephalus, extra-axial collection or mass lesion/mass effect. Generalized atrophy. Overall mild for age chronic small vessel ischemia in the cerebral white matter including a chronic lacune at the left corona radiata. Possible chronic lacune at  the right pons. Vascular: Atheromatous calcification. Skull: Negative Sinuses/Orbits: Negative IMPRESSION: 1. No acute finding. 2. Generalized atrophy with chronic small vessel ischemia. Electronically Signed   By: Marnee Spring M.D.   On: 05/07/2021 09:22   MR ANGIO HEAD WO CONTRAST  Result Date: 05/07/2021 CLINICAL DATA:  Acute cerebral and cerebellar infarcts on MRI. EXAM: MRA HEAD WITHOUT CONTRAST TECHNIQUE: Angiographic images of the Circle of Willis were acquired using MRA technique without intravenous contrast. COMPARISON:  No pertinent prior exam. FINDINGS: Anterior circulation: The internal carotid arteries are patent from skull base to carotid termini with moderate stenosis in the region of the anterior genu, right worse than left. ACAs and MCAs are patent without evidence of a proximal branch occlusion or significant A1 or M1 stenosis. There is moderate irregular narrowing of more distal branch vessels. No aneurysm is identified. Posterior circulation: The visualized distal vertebral arteries are patent with the left being strongly dominant. Patent right PICA, left AICA, and bilateral SCA origins are visualized. Basilar artery is widely patent. There is a small left posterior communicating artery. Both PCAs are patent with  branch vessel attenuation but no evidence of a significant proximal stenosis. No aneurysm is identified. Anatomic variants: Strongly dominant left vertebral artery. Other: None. IMPRESSION: 1. No large vessel occlusion. 2. Atherosclerosis including moderate bilateral ICA stenoses. Electronically Signed   By: Sebastian Ache M.D.   On: 05/07/2021 17:46   MR BRAIN WO CONTRAST  Result Date: 05/07/2021 CLINICAL DATA:  TIA, left arm weakness EXAM: MRI HEAD WITHOUT CONTRAST TECHNIQUE: Multiplanar, multiecho pulse sequences of the brain and surrounding structures were obtained without intravenous contrast. COMPARISON:  02/23/2020 FINDINGS: Brain: Small areas of cortical restricted  diffusion are present in the right frontoparietal lobes with notable involvement of the precentral gyrus in the hand motor region. Subtle corresponding loss of gray-white differentiation is noted on the prior CT. Additional small foci of reduced diffusion are present in the right posterior lentiform nucleus, left hippocampus, and left cerebellum. Few scattered foci of susceptibility in the cerebral subcortical white matter, left thalamus, and left cerebellum compatible with chronic microhemorrhages. Patchy and confluent areas of T2 hyperintensity in the supratentorial white matter are nonspecific but probably reflect mild to moderate chronic microvascular ischemic changes similar to the prior study. Chronic cortical/subcortical infarcts of the left frontal lobe. Small chronic infarcts of bilateral basal ganglia and corona radiata and right pons. Prominence of the ventricles and sulci reflects generalized parenchymal volume loss similar to the prior study. There is no intracranial mass or mass effect. No hydrocephalus or extra-axial collection. Vascular: Major vessel flow voids at the skull base are preserved. Skull and upper cervical spine: Normal marrow signal is preserved. Sinuses/Orbits: Paranasal sinuses are aerated. Orbits are unremarkable. Other: Sella is unremarkable.  Mastoid air cells are clear. IMPRESSION: Small acute infarcts in the right frontoparietal lobes, right posterior lentiform nucleus, left hippocampus, and left cerebellum. There is notable involvement of the right hand motor region. Chronic infarcts and chronic microvascular ischemic changes as seen previously. Few chronic microhemorrhages secondary to hypertension and/or amyloid angiopathy. Electronically Signed   By: Guadlupe Spanish M.D.   On: 05/07/2021 10:48   US Carotid Bilateral  Result Date: 05/08/2021 CLINICAL DATA:  Stroke-like symptoms EXAM: BILATERAL CAROTID DUPLEX ULTRASOUND TECHNIQUE: Wallace Cullens scale imaging, color Doppler and duplex  ultrasound were performed of bilateral carotid and vertebral arteries in the neck. COMPARISON:  None. FINDINGS: Criteria: Quantification of carotid stenosis is based on velocity parameters that correlate the residual internal carotid diameter with NASCET-based stenosis levels, using the diameter of the distal internal carotid lumen as the denominator for stenosis measurement. The following velocity measurements were obtained: RIGHT ICA: 59/7 cm/sec CCA: 58/11 cm/sec SYSTOLIC ICA/CCA RATIO:  1.0 ECA:  103 cm/sec LEFT ICA: 111/14 cm/sec CCA: 62/10 cm/sec SYSTOLIC ICA/CCA RATIO:  1.8 ECA:  156 cm/sec RIGHT CAROTID ARTERY: Extensively irregular/ulcerated heterogeneous atherosclerotic plaque throughout the common carotid artery extending into the proximal internal carotid artery. By peak systolic velocity criteria, the estimated stenosis is less than 50%. RIGHT VERTEBRAL ARTERY:  Patent with antegrade flow. LEFT CAROTID ARTERY: Irregular/ulcerated heterogeneous atherosclerotic plaque in the distal common carotid artery and proximal internal carotid artery. By peak systolic velocity criteria, the estimated stenosis is less than 50%. LEFT VERTEBRAL ARTERY:  Patent with antegrade flow. IMPRESSION: 1. Mild (1-49%) stenosis proximal right internal carotid artery secondary to irregular/ulcerated heterogeneous atherosclerotic plaque. 2. Mild (1-49%) stenosis proximal left internal carotid artery secondary to irregular/ulcerated heterogeneous atherosclerotic plaque. 3. Vertebral arteries are patent with normal antegrade flow. Signed, Sterling Big, MD, RPVI [facility] Radiology Electronically  Signed   By: Malachy Moan M.D.   On: 05/08/2021 08:18   DG Chest Port 1 View  Result Date: 05/07/2021 CLINICAL DATA:  Left-sided weakness EXAM: PORTABLE CHEST 1 VIEW COMPARISON:  11/29/2020 FINDINGS: Decreased lung volume with mild bibasilar atelectasis. No acute infiltrate or  edema. No pleural effusion. Chronic left rib fractures. IMPRESSION: No active disease. Electronically Signed   By: Marlan Palau M.D.   On: 05/07/2021 09:03   DG Finger Thumb Left  Result Date: 05/10/2021 CLINICAL DATA:  Left thumb pain and swelling EXAM: LEFT THUMB 2+V COMPARISON:  None. FINDINGS: Three view radiograph left thumb demonstrates normal alignment. No fracture or dislocation. There is moderate degenerative arthritis of the first [facility] joint at the base of the thumb. Mild degenerate arthritis of the interphalangeal joint of the thumb. Soft tissues are unremarkable. 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 Electronically Signed   By: Helyn Numbers MD   On: 05/10/2021 11:37   ECHOCARDIOGRAM COMPLETE  Result Date: 05/08/2021    ECHOCARDIOGRAM REPORT   Patient Name:   KEYGAN DUMOND Date of Exam: 05/08/2021 Medical Rec #:  [id number]        Height:       67.0 in Accession #:    [id number]       Weight:       136.9 lb Date of Birth:  07/22/1941         BSA:          1.721 m Patient Age:    79 years         BP:           149/87 mmHg Patient Gender: M                HR:           63 bpm. Exam Location:  [facility] Procedure: 2D Echo, Cardiac Doppler and Color Doppler Indications:     Stroke I63.9  History:         Patient has no prior history of Echocardiogram examinations.                  Risk Factors:Hypertension.  Sonographer:     Cristela Blue RDCS (AE) Referring Phys:  [id number] Lucile Shutters Diagnosing Phys: Harold Hedge MD  Sonographer Comments: Suboptimal parasternal window. IMPRESSIONS  1. Left ventricular ejection fraction, by estimation, is 55 to 60%. The left ventricle has normal function. The left ventricle has no regional wall motion abnormalities. Left ventricular diastolic parameters were normal.  2. Right ventricular systolic function is normal. The right ventricular size is normal.  3. The mitral valve is grossly normal. No evidence of mitral  valve regurgitation.  4. The aortic valve was not well visualized. Aortic valve regurgitation is not visualized. FINDINGS  Left Ventricle: Left ventricular ejection fraction, by estimation, is 55 to 60%. The left ventricle has normal function. The left ventricle has no regional wall motion abnormalities. The left ventricular internal cavity size was normal in size. There is  no left ventricular hypertrophy. Left ventricular diastolic parameters were normal. Right Ventricle: The right ventricular size is normal. No increase in right ventricular wall thickness. Right ventricular systolic function is normal. Left Atrium: Left atrial size was normal in size. Right Atrium: Right atrial size was normal in size. Pericardium: There is no evidence of pericardial effusion. Mitral Valve: The mitral valve is grossly normal. No evidence of mitral valve regurgitation. Tricuspid  Valve: The tricuspid valve is grossly normal. Tricuspid valve regurgitation is trivial. Aortic Valve: The aortic valve was not well visualized. Aortic valve regurgitation is not visualized. Aortic valve mean gradient measures 3.0 mmHg. Aortic valve peak gradient measures 5.2 mmHg. Aortic valve area, by VTI measures 2.96 cm. Pulmonic Valve: The pulmonic valve was not well visualized. Pulmonic valve regurgitation is not visualized. Aorta: The aortic root is normal in size and structure. IAS/Shunts: The atrial septum is grossly normal.  LEFT VENTRICLE PLAX 2D LVIDd:         3.30 cm  Diastology LVIDs:         2.30 cm  LV e' medial:    4.90 cm/s LV PW:         1.20 cm  LV E/e' medial:  15.8 LV IVS:        1.00 cm  LV e' lateral:   7.62 cm/s LVOT diam:     2.00 cm  LV E/e' lateral: 10.2 LV SV:         68 LV SV Index:   40 LVOT Area:     3.14 cm  RIGHT VENTRICLE RV Basal diam:  2.20 cm RV S prime:     10.20 cm/s TAPSE (M-mode): 4.1 cm LEFT ATRIUM             Index       RIGHT ATRIUM           Index LA diam:        3.30 cm 1.92 cm/m  RA Area:     13.70 cm LA  Vol (A2C):   54.4 ml 31.60 ml/m RA Volume:   28.40 ml  16.50 ml/m LA Vol (A4C):   40.7 ml 23.64 ml/m LA Biplane Vol: 47.4 ml 27.54 ml/m  AORTIC VALVE                   PULMONIC VALVE AV Area (Vmax):    2.43 cm    PV Vmax:        0.54 m/s AV Area (Vmean):   2.35 cm    PV Peak grad:   1.2 mmHg AV Area (VTI):     2.96 cm    RVOT Peak grad: 2 mmHg AV Vmax:           114.05 cm/s AV Vmean:          75.550 cm/s AV VTI:            0.231 m AV Peak Grad:      5.2 mmHg AV Mean Grad:      3.0 mmHg LVOT Vmax:         88.10 cm/s LVOT Vmean:        56.500 cm/s LVOT VTI:          0.218 m LVOT/AV VTI ratio: 0.94  AORTA Ao Root diam: 3.40 cm MITRAL VALVE                TRICUSPID VALVE MV Area (PHT): 3.31 cm     TR Peak grad:   9.2 mmHg MV Decel Time: 229 msec     TR Vmax:        152.00 cm/s MV E velocity: 77.60 cm/s MV A velocity: 102.00 cm/s  SHUNTS MV E/A ratio:  0.76         Systemic VTI:  0.22 m  Systemic Diam: 2.00 cm Harold Hedge MD Electronically signed by Harold Hedge MD Signature Date/Time: 05/08/2021/10:41:55 AM    Final    (Echo, Carotid, EGD, Colonoscopy, ERCP)    Subjective: Seen and examined on the day of discharge.  Stable, no distress.  Stable for discharge to skilled nursing facility.  Discharge Exam: Vitals:   05/11/21 0444 05/11/21 0738  BP: 129/77 (!) 171/72  Pulse: (!) 55 (!) 56  Resp: 15 16  Temp: 97.7 F (36.5 C) 97.7 F (36.5 C)  SpO2: 95% 96%   Vitals:   05/10/21 1803 05/10/21 2101 05/11/21 0444 05/11/21 0738  BP: (!) 138/96 (!) 155/101 129/77 (!) 171/72  Pulse: 64 61 (!) 55 (!) 56  Resp:  Temp: 98.1 F (36.7 C) 98.1 F (36.7 C) 97.7 F (36.5 C) 97.7 F (36.5 C)  TempSrc: Oral Oral Oral   SpO2: 97% 100% 95% 96%  Weight:      Height:        General: Pt is alert, awake, not in acute distress Cardiovascular: RRR, S1/S2 +, no rubs, no gallops Respiratory: CTA bilaterally, no wheezing, no rhonchi Abdominal: Soft, NT, ND, bowel sounds  + Extremities: no edema, no cyanosis    The results of significant diagnostics from this hospitalization (including imaging, microbiology, ancillary and laboratory) are listed below for reference.     Microbiology: Recent Results (from the past 240 hour(s))  Resp Panel by RT-PCR (Flu A&B, Covid) Nasopharyngeal Swab     Status: None   Collection Time: 05/07/21 10:50 AM   Specimen: Nasopharyngeal Swab; Nasopharyngeal(NP) swabs in vial transport medium  Result Value Ref Range Status   SARS Coronavirus 2 by RT PCR NEGATIVE NEGATIVE Final    Comment: (NOTE) SARS-CoV-2 target nucleic acids are NOT DETECTED.  The SARS-CoV-2 RNA is generally detectable in upper respiratory specimens during the acute phase of infection. The lowest concentration of SARS-CoV-2 viral copies this assay can detect is 138 copies/mL. A negative result does not preclude SARS-Cov-2 infection and should not be used as the sole basis for treatment or other patient management decisions. A negative result may occur with  improper specimen collection/handling, submission of specimen other than nasopharyngeal swab, presence of viral mutation(s) within the areas targeted by this assay, and inadequate number of viral copies(<138 copies/mL). A negative result must be combined with clinical observations, patient history, and epidemiological information. The expected result is Negative.  Fact Sheet for Patients:  BloggerCourse.com  Fact Sheet for Healthcare Providers:  SeriousBroker.it  This test is no t yet approved or cleared by the [place] FDA and  has been authorized for detection and/or diagnosis of SARS-CoV-2 by FDA under an Emergency Use Authorization (EUA). This EUA will remain  in effect (meaning this test can be used) for the duration of the COVID-19 declaration under Section 564(b)(1) of the Act, 21 U.S.C.section 360bbb-3(b)(1), unless the authorization  is terminated  or revoked sooner.       Influenza A by PCR NEGATIVE NEGATIVE Final   Influenza B by PCR NEGATIVE NEGATIVE Final    Comment: (NOTE) The Xpert Xpress SARS-CoV-2/FLU/RSV plus assay is intended as an aid in the diagnosis of influenza from Nasopharyngeal swab specimens and should not be used as a sole basis for treatment. Nasal washings and aspirates are unacceptable for Xpert Xpress SARS-CoV-2/FLU/RSV testing.  Fact Sheet for Patients: BloggerCourse.com  Fact Sheet for Healthcare Providers: SeriousBroker.it  This test is not yet approved or cleared by the [place] FDA  and has been authorized for detection and/or diagnosis of SARS-CoV-2 by FDA under an Emergency Use Authorization (EUA). This EUA will remain in effect (meaning this test can be used) for the duration of the COVID-19 declaration under Section 564(b)(1) of the Act, 21 U.S.C. section 360bbb-3(b)(1), unless the authorization is terminated or revoked.  Performed at [facility], [address] [place], [place] [address]      Labs: BNP (last 3 results) Recent Labs    11/29/20 1221  BNP 205.7*   Basic Metabolic Panel: Recent Labs  Lab 05/07/21 0900 05/09/21 0858 05/10/21 0519 05/11/21 0938  NA 139 136  --  134*  K 3.8 3.8  --  4.0  CL 104 100  --  102  CO2 27 22  --  23  GLUCOSE 109* 86  --  106*  BUN 19 23  --  32*  CREATININE 1.22 1.00  --  1.16  CALCIUM 9.9 10.1  --  9.8  MG 1.6*  --  1.6* 1.9  PHOS 2.9  --   --   --    Liver Function Tests: Recent Labs  Lab 05/07/21 0900 05/11/21 0938  AST 54* 31  ALT 26 21  ALKPHOS 75 65  BILITOT 1.4* 0.9  PROT 7.0 7.3  ALBUMIN 3.8 3.6   No results for input(s): LIPASE, AMYLASE in the last 168 hours. Recent Labs  Lab 05/11/21 0943  AMMONIA 14   CBC: Recent Labs  Lab 05/07/21 0900 05/09/21 0858 05/11/21 0938  WBC 6.9 7.1 10.3  NEUTROABS 3.9 4.2 6.8  HGB 12.4*  12.3* 13.1  HCT 35.7* 35.5* 37.1*  MCV 104.7* 104.4* 103.9*  PLT 183 194 208   Cardiac Enzymes: No results for input(s): CKTOTAL, CKMB, CKMBINDEX, TROPONINI in the last 168 hours. BNP: Invalid input(s): POCBNP CBG: Recent Labs  Lab 05/09/21 1135  GLUCAP 78   D-Dimer No results for input(s): DDIMER in the last 72 hours. Hgb A1c No results for input(s): HGBA1C in the last 72 hours. Lipid Profile No results for input(s): CHOL, HDL, LDLCALC, TRIG, CHOLHDL, LDLDIRECT in the last 72 hours. Thyroid function studies Recent Labs    05/11/21 0938  TSH 2.264   Anemia work up Recent Labs    05/09/21 0901  VITAMINB12 192   Urinalysis    Component Value Date/Time   COLORURINE YELLOW (A) 11/29/2020 1539   APPEARANCEUR HAZY (A) 11/29/2020 1539   APPEARANCEUR Clear 06/12/2014 1029   LABSPEC 1.015 11/29/2020 1539   LABSPEC 1.005 06/12/2014 1029   PHURINE 7.0 11/29/2020 1539   GLUCOSEU NEGATIVE 11/29/2020 1539   GLUCOSEU Negative 06/12/2014 1029   HGBUR NEGATIVE 11/29/2020 1539   BILIRUBINUR NEGATIVE 11/29/2020 1539   BILIRUBINUR Negative 06/12/2014 1029   KETONESUR NEGATIVE 11/29/2020 1539   PROTEINUR 30 (A) 11/29/2020 1539   NITRITE POSITIVE (A) 11/29/2020 1539   LEUKOCYTESUR LARGE (A) 11/29/2020 1539   LEUKOCYTESUR Negative 06/12/2014 1029   Sepsis Labs Invalid input(s): PROCALCITONIN,  WBC,  LACTICIDVEN Microbiology Recent Results (from the past 240 hour(s))  Resp Panel by RT-PCR (Flu A&B, Covid) Nasopharyngeal Swab     Status: None   Collection Time: 05/07/21 10:50 AM   Specimen: Nasopharyngeal Swab; Nasopharyngeal(NP) swabs in vial transport medium  Result Value Ref Range Status   SARS Coronavirus 2 by RT PCR NEGATIVE NEGATIVE Final    Comment: (NOTE) SARS-CoV-2 target nucleic acids are NOT DETECTED.  The SARS-CoV-2 RNA is generally detectable in upper respiratory specimens during the acute phase of  infection. The lowest concentration of SARS-CoV-2 viral copies  this assay can detect is 138 copies/mL. A negative result does not preclude SARS-Cov-2 infection and should not be used as the sole basis for treatment or other patient management decisions. A negative result may occur with  improper specimen collection/handling, submission of specimen other than nasopharyngeal swab, presence of viral mutation(s) within the areas targeted by this assay, and inadequate number of viral copies(<138 copies/mL). A negative result must be combined with clinical observations, patient history, and epidemiological information. The expected result is Negative.  Fact Sheet for Patients:  BloggerCourse.com  Fact Sheet for Healthcare Providers:  SeriousBroker.it  This test is no t yet approved or cleared by the [place] FDA and  has been authorized for detection and/or diagnosis of SARS-CoV-2 by FDA under an Emergency Use Authorization (EUA). This EUA will remain  in effect (meaning this test can be used) for the duration of the COVID-19 declaration under Section 564(b)(1) of the Act, 21 U.S.C.section 360bbb-3(b)(1), unless the authorization is terminated  or revoked sooner.       Influenza A by PCR NEGATIVE NEGATIVE Final   Influenza B by PCR NEGATIVE NEGATIVE Final    Comment: (NOTE) The Xpert Xpress SARS-CoV-2/FLU/RSV plus assay is intended as an aid in the diagnosis of influenza from Nasopharyngeal swab specimens and should not be used as a sole basis for treatment. Nasal washings and aspirates are unacceptable for Xpert Xpress SARS-CoV-2/FLU/RSV testing.  Fact Sheet for Patients: BloggerCourse.com  Fact Sheet for Healthcare Providers: SeriousBroker.it  This test is not yet approved or cleared by the [place] FDA and has been authorized for detection and/or diagnosis of SARS-CoV-2 by FDA under an Emergency Use Authorization (EUA). This EUA  will remain in effect (meaning this test can be used) for the duration of the COVID-19 declaration under Section 564(b)(1) of the Act, 21 U.S.C. section 360bbb-3(b)(1), unless the authorization is terminated or revoked.  Performed at [facility], [address], [place], [place] [address]      Time coordinating discharge: Over 30 minutes  SIGNED:   Tresa Moore, MD  [facility] 05/11/2021, 11:34 AM Pager   If 7PM-7AM, please contact night-coverage

## 2021-05-11 NOTE — NC FL2 (Signed)
[place] MEDICAID FL2 LEVEL OF CARE SCREENING TOOL     IDENTIFICATION  Patient Name: Richard Carter Birthdate: 12-27-1940 Sex: male Admission Date (Current Location): 05/07/2021  [facility] and [place] Number:  Chiropodist and Address:  [facility], [address], [place], [place] [address]      Provider Number: [id number]  Attending Physician Name and Address:  Tresa Moore, MD  Relative Name and Phone Number:       Current Level of Care: Hospital Recommended Level of Care: Skilled Nursing Facility Prior Approval Number:    Date Approved/Denied:   PASRR Number: [id number]  Discharge Plan: SNF    Current Diagnoses: Patient Active Problem List   Diagnosis Date Noted   Acute CVA (cerebrovascular accident) (HCC) 05/07/2021   Non compliance w medication regimen 05/07/2021   Dementia (HCC) 05/07/2021   AF (paroxysmal atrial fibrillation) (HCC) 05/07/2021   HTN (hypertension) 11/29/2020   Alcohol abuse 11/29/2020   AKI (acute kidney injury) (HCC) 11/29/2020   Atrial flutter with rapid ventricular response (HCC) 11/29/2020   Seizure (HCC) 02/22/2020   Pubic ramus fracture (HCC) 07/29/2019   Acetabular fracture (HCC) 07/29/2019   HLD (hyperlipidemia) 07/29/2019   GERD (gastroesophageal reflux disease) 07/29/2019   UTI (urinary tract infection) 07/29/2019    Orientation RESPIRATION BLADDER Height & Weight     Self, Place, Time  Normal Continent Weight: 62.1 kg Height:  5\' 7"  (170.2 cm)  BEHAVIORAL SYMPTOMS/MOOD NEUROLOGICAL BOWEL NUTRITION STATUS  Other (Comment) (Anxiety) Convulsions/Seizures (Dementia. History of seizure (March).) Continent Diet (Diet rec'd at D/C:   Dysphagia level 1 (PUREE) w/ gravies added to moisten; NECTAR consistency liquids VIA CUP.  General aspiration precautions.  PILLS in a PUREE.  Tray setup at meals.)  AMBULATORY STATUS COMMUNICATION OF NEEDS Skin   Limited Assist Verbally Bruising                        Personal Care Assistance Level of Assistance  Bathing, Feeding, Dressing Bathing Assistance: Limited assistance Feeding assistance: Independent Dressing Assistance: Limited assistance     Functional Limitations Info  Sight, Hearing, Speech Sight Info: Adequate Hearing Info: Adequate Speech Info: Adequate    SPECIAL CARE FACTORS FREQUENCY  PT (By licensed PT), OT (By licensed OT)     PT Frequency: 5 x week OT Frequency: 5 x week            Contractures Contractures Info: Not present    Additional Factors Info  Code Status, Allergies Code Status Info: DNR Allergies Info: NKDA           Current Medications (05/11/2021):  This is the current hospital active medication list Current Facility-Administered Medications  Medication Dose Route Frequency Provider Last Rate Last Admin   acetaminophen (TYLENOL) tablet 650 mg  650 mg Oral Q4H PRN Agbata, Tochukwu, MD       Or   acetaminophen (TYLENOL) 160 MG/5ML solution 650 mg  650 mg Per Tube Q4H PRN Agbata, Tochukwu, MD       Or   acetaminophen (TYLENOL) suppository 650 mg  650 mg Rectal Q4H PRN Agbata, Tochukwu, MD       amLODipine (NORVASC) tablet 5 mg  5 mg Oral Daily 05/13/2021, Sudheer B, MD   5 mg at 05/11/21 0957   apixaban (ELIQUIS) tablet 5 mg  5 mg Oral BID Bhagat, Srishti L, MD   5 mg at 05/11/21 0958   atorvastatin (LIPITOR) tablet 20 mg  20 mg Oral QHS Lolita Patella B, MD   20 mg at 05/10/21 2138   folic acid (FOLVITE) tablet 1 mg  1 mg Oral Daily Agbata, Tochukwu, MD   1 mg at 05/11/21 4970   hydrALAZINE (APRESOLINE) injection 10 mg  10 mg Intravenous Q4H PRN Lolita Patella B, MD   10 mg at 05/09/21 1110   levETIRAcetam (KEPPRA) tablet 500 mg  500 mg Oral BID Agbata, Tochukwu, MD   500 mg at 05/11/21 0956   LORazepam (ATIVAN) tablet 0-4 mg  0-4 mg Oral Q12H Agbata, Tochukwu, MD       losartan (COZAAR) tablet 25 mg  25 mg Oral Daily Georgeann Oppenheim, Sudheer B, MD   25 mg at 05/11/21 0957   magnesium  oxide (MAG-OX) tablet 400 mg  400 mg Oral Daily Agbata, Tochukwu, MD   400 mg at 05/11/21 0957   metoprolol succinate (TOPROL-XL) 24 hr tablet 50 mg  50 mg Oral Daily Georgeann Oppenheim, Sudheer B, MD   50 mg at 05/11/21 0957   metoprolol tartrate (LOPRESSOR) injection 5 mg  5 mg Intravenous Q2H PRN Lolita Patella B, MD       multivitamin with minerals tablet 1 tablet  1 tablet Oral Daily Agbata, Tochukwu, MD   1 tablet at 05/11/21 0956   nicotine (NICODERM CQ - dosed in mg/24 hours) patch 14 mg  14 mg Transdermal Daily Bhagat, Srishti L, MD   14 mg at 05/11/21 1005   nicotine polacrilex (NICORETTE) gum 2 mg  2 mg Oral PRN Bhagat, Srishti L, MD       pantoprazole (PROTONIX) EC tablet 40 mg  40 mg Oral Daily Agbata, Tochukwu, MD   40 mg at 05/11/21 0958   sodium chloride flush (NS) 0.9 % injection 3 mL  3 mL Intravenous Once Jene Every, MD       spiritus frumenti (ethyl alcohol) solution 1 each  1 each Oral Daily PRN Lolita Patella B, MD   1 each at 05/08/21 2050   thiamine tablet 100 mg  100 mg Oral Daily Agbata, Tochukwu, MD   100 mg at 05/11/21 2637   Or   thiamine (B-1) injection 100 mg  100 mg Intravenous Daily Agbata, Tochukwu, MD       traZODone (DESYREL) tablet 100 mg  100 mg Oral QHS Bhagat, Srishti L, MD   100 mg at 05/10/21 2139   vitamin B-12 (CYANOCOBALAMIN) tablet 1,000 mcg  1,000 mcg Oral Daily Bhagat, Srishti L, MD   1,000 mcg at 05/11/21 0957     Discharge Medications: Please see discharge summary for a list of discharge medications.  Relevant Imaging Results:  Relevant Lab Results:   Additional Information SS#: [id number]  Allayne Butcher, RN

## 2021-05-11 NOTE — Care Management Important Message (Signed)
Important Message  Patient Details  Name: Richard Carter MRN: [id number] Date of Birth: 04/03/1941   Medicare Important Message Given:  Yes     [name] 05/11/2021, 10:35 AM

## 2021-05-11 NOTE — Progress Notes (Signed)
   05/08/21 1415  Assess: MEWS Score  BP (!) 141/96  Pulse Rate (!) 112  SpO2 100 %  Assess: MEWS Score  MEWS Temp 0  MEWS Systolic 0  MEWS Pulse 2  MEWS RR 0  MEWS LOC 0  MEWS Score 2  MEWS Score Color Yellow  Assess: if the MEWS score is Yellow or Red  Were vital signs taken at a resting state? Yes  Focused Assessment No change from prior assessment  Early Detection of Sepsis Score *See Row Information* Low  MEWS guidelines implemented *See Row Information* Yes  Treat  MEWS Interventions Escalated (See documentation below)  Pain Scale 0-10  Pain Score 0  Take Vital Signs  Increase Vital Sign Frequency  Yellow: Q 2hr X 2 then Q 4hr X 2, if remains yellow, continue Q 4hrs  Escalate  MEWS: Escalate Yellow: discuss with charge nurse/RN and consider discussing with provider and RRT  Notify: Charge Nurse/RN  Name of Charge Nurse/RN Notified Morrie Sheldon RN  Date Charge Nurse/RN Notified 05/08/21  Time Charge Nurse/RN Notified 1430  Notify: Provider  Provider Name/Title Dr Georgeann Oppenheim  Date Provider Notified 05/08/21  Time Provider Notified 1430  Notification Type  (secure chat)  Notification Reason Other (Comment) (tachycardia)  Provider response See new orders  Date of Provider Response 05/08/21  Time of Provider Response 1435  Document  Patient Outcome Stabilized after interventions  Progress note created (see row info) Yes  Inserted for Daria Pastures RN

## 2021-05-11 NOTE — TOC Transition Note (Signed)
Transition of Care [facility]) - CM/SW Discharge Note   Patient Details  Name: Richard Carter MRN: [id number] Date of Birth: 09-25-1941  [facility] CM/SW Contact:  Allayne Butcher, RN Phone Number: 05/11/2021, 2:18 PM   Clinical Narrative:    Patient medically cleared for discharge to [facility] today.  EMS has been arranged for transport, patient is 2nd on the list for pick up.  Bedside RN is calling report and patient will go to [phone].     Final next level of care: Skilled Nursing Facility Barriers to Discharge: Barriers Resolved   Patient Goals and CMS Choice Patient states their goals for this hospitalization and ongoing recovery are:: Patient agrees to SNF if recommended CMS Medicare.gov Compare Post Acute Care list provided to:: Patient Choice offered to / list presented to : Patient  Discharge Placement              Patient chooses bed at: [facility] Patient to be transferred to facility by: [facility] Name of family member notified: patient will notify family Patient and family notified of of transfer: 05/11/21  Discharge Plan and Services   Discharge Planning Services: CM Consult Post Acute Care Choice: Skilled Nursing Facility          DME Arranged: N/A DME Agency: NA       HH Arranged: NA          Social Determinants of Health (SDOH) Interventions     Readmission Risk Interventions No flowsheet data found.

## 2021-05-11 NOTE — Progress Notes (Signed)
Neurology Progress Note  Patient ID: Richard Carter is a 80 y.o. 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  Initially consulted for: Strokes Reconsulted for lethargy  Major interval events:  -Received his scheduled trazodone 100 mg last night -Per nursing did try to drop a few times overnight, forgetting he had condom catheter in place but was redirectable  Subjective: -Sleeping on this morning but able to sit up and eager to start eating his breakfast -Eager to go home   Exam: Today's Vitals   05/10/21 2101 05/11/21 0000 05/11/21 0444 05/11/21 0738  BP: (!) 155/101  129/77 (!) 171/72  Pulse: 61  (!) 55 (!) 56  Resp: 18  15 16   Temp: 98.1 F (36.7 C)  97.7 F (36.5 C) 97.7 F (36.5 C)  TempSrc: Oral  Oral   SpO2: 100%  95% 96%  Weight:      Height:      PainSc:  0-No pain  0-No pain   Body mass index is 21.44 kg/m.  Gen: In bed, comfortable  Resp: non-labored breathing, no grossly audible wheezing Cardiac: Perfusing extremities well  Abd: soft, nt  Neuro: MS: Awakens, eating breakfast.  He is able to follow simple commands such as giving me a thumbs up and is oriented to his age but continues to state that it is November.  Speech is somewhat slow in the setting of just waking up but able to correctly identify objects CN: Pupils equal, EOMI, facial sensation symmetric, face with a mild left facial droop, tongue midline Motor: Limited participation due to wanting to eat his breakfast, but he is able to sit up at the side of the bed with some mild truncal swaying and some mild asterixis of the bilateral upper extremities.  He does have stable left upper extremity weakness especially distally Sensory: Equally reactive to light touch in all 4  extremities  Pertinent Labs: Glu 78 ABG within normal limits Lab Results  Component Value Date   VITAMINB12 192 05/09/2021     Initial stroke workup:  Lab Results  Component Value Date   CHOL 142 05/08/2021   HDL 98 05/08/2021   LDLCALC 34 05/08/2021   TRIG 51 05/08/2021   CHOLHDL 1.4 05/08/2021   Lab Results  Component Value Date   HGBA1C 5.2 05/08/2021    6/14 MRI brain personally reviewed, multifocal infarcts and a central embolic pattern most notably with involvement of the right cortical hand 6/14 MRA brain personally reviewed no large vessel occlusion or clinically significant stenosis Repeat head CT obtained 6/15 reviewed personally, agree with radiology that there is no acute intracranial process Repeat head CT obtained 6/16 reviewed personally, agree with radiology that there is no acute intracranial process  Carotid duplex 6/14 1. Mild (1-49%) stenosis proximal right internal carotid artery secondary to irregular/ulcerated heterogeneous atherosclerotic plaque.  2. Mild (1-49%) stenosis proximal left internal carotid artery secondary to irregular/ulcerated heterogeneous atherosclerotic  plaque. 3. Vertebral arteries are patent with normal antegrade flow.    ECHO:  1. Left ventricular ejection fraction, by estimation, is 55 to 60%. The left ventricle has normal function. The left ventricle has no regional wall motion abnormalities. Left ventricular diastolic parameters were normal.   2. Right ventricular systolic function is normal. The right ventricular size is normal.   3. The mitral valve is grossly normal. No evidence of mitral valve  regurgitation.   4. The aortic valve was not well visualized. Aortic valve regurgitation is not visualized.    Impression: Continues to be quite sleepy this morning.  Given 2 negative head CTs 24 hours apart, low concern that there was a significant new stroke while awaiting initiation of Eliquis, and he appears to be tolerating  Eliquis at this time.  I will obtain toxic/metabolic work-up today given his continued lethargy.  Recommendations: # Multifocal small strokes, secondary to known Afib off AC -LDL meeting goal of less than 70, continue home atorvastatin 20 mg -Eliquis 5 mg, BID started 6/16 -Risk factor modification, patient agreeable to starting nicotine patches to aid in smoking cessation.  Additionally ordered as needed nicotine gum   #History of seizures -Continue home Keppra 500 mg twice daily -Patient gradually cutting back on alcohol with the help of his outpatient caregiver, continue plan   #History of dementia > Per outpatient neurology notes, donepezil/Aricept was discontinued on 01/03/2021 due to weight loss and decreased appetite with decrease in trazodone from 200 mg to 100 mg nightly and initiation of mirtazapine to aid with sleep as well as appetite.  Received this home regimen on 6/14 evening since which time he has remained fairly sleepy though improving today. -Continue trazodone 100 mg nightly only and discontinue mirtazapine as this appears to be working well for him, consider further tapering of trazodone on an outpatient basis   #Difficulty with swallowing large pills -SLP eval, consider change in formulation of home KLOR to packets  #Alcohol abuse history, lethargy  -Consider discontinuing as needed alcohol (he has not had any since a single dose on 6/14) -Continue trazodone 100 mg QHS, discontinued mirtazapine and keep it off of his medication list at discharge -Continue Empiric B12 1000 mcg daily -Agree with folate and thiamine supplementation -Ordered basic labs today to ensure we are not missing reversible causes (CMP, CBC, magnesium, TSH, ammonia)  Brooke Dare MD-PhD [facility] [phone]   Greater than 25 minutes were spent in the care of this patient today, the majority at bedside

## 2021-05-14 DIAGNOSIS — G4089 Other seizures: Secondary | ICD-10-CM | POA: Diagnosis not present

## 2021-05-14 DIAGNOSIS — Z8673 Personal history of transient ischemic attack (TIA), and cerebral infarction without residual deficits: Secondary | ICD-10-CM | POA: Diagnosis not present

## 2021-05-14 DIAGNOSIS — F0150 Vascular dementia without behavioral disturbance: Secondary | ICD-10-CM | POA: Diagnosis not present

## 2021-05-14 DIAGNOSIS — I480 Paroxysmal atrial fibrillation: Secondary | ICD-10-CM | POA: Diagnosis not present

## 2021-05-14 DIAGNOSIS — I10 Essential (primary) hypertension: Secondary | ICD-10-CM | POA: Diagnosis not present

## 2021-05-17 DIAGNOSIS — R270 Ataxia, unspecified: Secondary | ICD-10-CM | POA: Diagnosis not present

## 2021-05-17 DIAGNOSIS — I6930 Unspecified sequelae of cerebral infarction: Secondary | ICD-10-CM | POA: Diagnosis not present

## 2021-05-17 DIAGNOSIS — F0150 Vascular dementia without behavioral disturbance: Secondary | ICD-10-CM | POA: Diagnosis not present

## 2021-05-17 DIAGNOSIS — I480 Paroxysmal atrial fibrillation: Secondary | ICD-10-CM | POA: Diagnosis not present

## 2021-05-17 DIAGNOSIS — F1010 Alcohol abuse, uncomplicated: Secondary | ICD-10-CM | POA: Diagnosis not present

## 2021-05-17 DIAGNOSIS — G4089 Other seizures: Secondary | ICD-10-CM | POA: Diagnosis not present

## 2021-05-30 DIAGNOSIS — F0280 Dementia in other diseases classified elsewhere without behavioral disturbance: Secondary | ICD-10-CM | POA: Diagnosis not present

## 2021-05-30 DIAGNOSIS — Z789 Other specified health status: Secondary | ICD-10-CM | POA: Diagnosis not present

## 2021-05-30 DIAGNOSIS — Z8673 Personal history of transient ischemic attack (TIA), and cerebral infarction without residual deficits: Secondary | ICD-10-CM | POA: Diagnosis not present

## 2021-05-30 DIAGNOSIS — G309 Alzheimer's disease, unspecified: Secondary | ICD-10-CM | POA: Diagnosis not present

## 2021-05-30 DIAGNOSIS — I480 Paroxysmal atrial fibrillation: Secondary | ICD-10-CM | POA: Diagnosis not present

## 2021-05-30 DIAGNOSIS — F0150 Vascular dementia without behavioral disturbance: Secondary | ICD-10-CM | POA: Diagnosis not present

## 2021-05-30 DIAGNOSIS — R569 Unspecified convulsions: Secondary | ICD-10-CM | POA: Diagnosis not present

## 2021-06-01 DIAGNOSIS — I10 Essential (primary) hypertension: Secondary | ICD-10-CM | POA: Diagnosis not present

## 2021-06-01 DIAGNOSIS — F0150 Vascular dementia without behavioral disturbance: Secondary | ICD-10-CM | POA: Diagnosis not present

## 2021-06-01 DIAGNOSIS — G4089 Other seizures: Secondary | ICD-10-CM | POA: Diagnosis not present

## 2021-06-01 DIAGNOSIS — I480 Paroxysmal atrial fibrillation: Secondary | ICD-10-CM | POA: Diagnosis not present

## 2021-06-01 DIAGNOSIS — Z8673 Personal history of transient ischemic attack (TIA), and cerebral infarction without residual deficits: Secondary | ICD-10-CM | POA: Diagnosis not present

## 2021-06-01 DIAGNOSIS — R270 Ataxia, unspecified: Secondary | ICD-10-CM | POA: Diagnosis not present

## 2021-06-08 DIAGNOSIS — F0150 Vascular dementia without behavioral disturbance: Secondary | ICD-10-CM | POA: Diagnosis not present

## 2021-06-08 DIAGNOSIS — K219 Gastro-esophageal reflux disease without esophagitis: Secondary | ICD-10-CM | POA: Diagnosis not present

## 2021-06-08 DIAGNOSIS — I4821 Permanent atrial fibrillation: Secondary | ICD-10-CM | POA: Diagnosis not present

## 2021-06-08 DIAGNOSIS — G40909 Epilepsy, unspecified, not intractable, without status epilepticus: Secondary | ICD-10-CM | POA: Diagnosis not present

## 2021-06-08 DIAGNOSIS — G4700 Insomnia, unspecified: Secondary | ICD-10-CM | POA: Diagnosis not present

## 2021-06-08 DIAGNOSIS — I69354 Hemiplegia and hemiparesis following cerebral infarction affecting left non-dominant side: Secondary | ICD-10-CM | POA: Diagnosis not present

## 2021-06-08 DIAGNOSIS — G309 Alzheimer's disease, unspecified: Secondary | ICD-10-CM | POA: Diagnosis not present

## 2021-06-08 DIAGNOSIS — I10 Essential (primary) hypertension: Secondary | ICD-10-CM | POA: Diagnosis not present

## 2021-06-08 DIAGNOSIS — Z87891 Personal history of nicotine dependence: Secondary | ICD-10-CM | POA: Diagnosis not present

## 2021-06-11 DIAGNOSIS — K219 Gastro-esophageal reflux disease without esophagitis: Secondary | ICD-10-CM | POA: Diagnosis not present

## 2021-06-11 DIAGNOSIS — I4821 Permanent atrial fibrillation: Secondary | ICD-10-CM | POA: Diagnosis not present

## 2021-06-11 DIAGNOSIS — I10 Essential (primary) hypertension: Secondary | ICD-10-CM | POA: Diagnosis not present

## 2021-06-11 DIAGNOSIS — Z87891 Personal history of nicotine dependence: Secondary | ICD-10-CM | POA: Diagnosis not present

## 2021-06-11 DIAGNOSIS — I69354 Hemiplegia and hemiparesis following cerebral infarction affecting left non-dominant side: Secondary | ICD-10-CM | POA: Diagnosis not present

## 2021-06-11 DIAGNOSIS — G4700 Insomnia, unspecified: Secondary | ICD-10-CM | POA: Diagnosis not present

## 2021-06-11 DIAGNOSIS — F0150 Vascular dementia without behavioral disturbance: Secondary | ICD-10-CM | POA: Diagnosis not present

## 2021-06-11 DIAGNOSIS — G309 Alzheimer's disease, unspecified: Secondary | ICD-10-CM | POA: Diagnosis not present

## 2021-06-11 DIAGNOSIS — G40909 Epilepsy, unspecified, not intractable, without status epilepticus: Secondary | ICD-10-CM | POA: Diagnosis not present

## 2021-06-15 DIAGNOSIS — I69354 Hemiplegia and hemiparesis following cerebral infarction affecting left non-dominant side: Secondary | ICD-10-CM | POA: Diagnosis not present

## 2021-06-15 DIAGNOSIS — K219 Gastro-esophageal reflux disease without esophagitis: Secondary | ICD-10-CM | POA: Diagnosis not present

## 2021-06-15 DIAGNOSIS — G4700 Insomnia, unspecified: Secondary | ICD-10-CM | POA: Diagnosis not present

## 2021-06-15 DIAGNOSIS — G309 Alzheimer's disease, unspecified: Secondary | ICD-10-CM | POA: Diagnosis not present

## 2021-06-15 DIAGNOSIS — I4821 Permanent atrial fibrillation: Secondary | ICD-10-CM | POA: Diagnosis not present

## 2021-06-15 DIAGNOSIS — I10 Essential (primary) hypertension: Secondary | ICD-10-CM | POA: Diagnosis not present

## 2021-06-15 DIAGNOSIS — G40909 Epilepsy, unspecified, not intractable, without status epilepticus: Secondary | ICD-10-CM | POA: Diagnosis not present

## 2021-06-15 DIAGNOSIS — F0150 Vascular dementia without behavioral disturbance: Secondary | ICD-10-CM | POA: Diagnosis not present

## 2021-06-15 DIAGNOSIS — Z87891 Personal history of nicotine dependence: Secondary | ICD-10-CM | POA: Diagnosis not present

## 2021-06-19 DIAGNOSIS — I69354 Hemiplegia and hemiparesis following cerebral infarction affecting left non-dominant side: Secondary | ICD-10-CM | POA: Diagnosis not present

## 2021-06-19 DIAGNOSIS — I10 Essential (primary) hypertension: Secondary | ICD-10-CM | POA: Diagnosis not present

## 2021-06-19 DIAGNOSIS — G4700 Insomnia, unspecified: Secondary | ICD-10-CM | POA: Diagnosis not present

## 2021-06-19 DIAGNOSIS — G309 Alzheimer's disease, unspecified: Secondary | ICD-10-CM | POA: Diagnosis not present

## 2021-06-19 DIAGNOSIS — Z87891 Personal history of nicotine dependence: Secondary | ICD-10-CM | POA: Diagnosis not present

## 2021-06-19 DIAGNOSIS — I4821 Permanent atrial fibrillation: Secondary | ICD-10-CM | POA: Diagnosis not present

## 2021-06-19 DIAGNOSIS — K219 Gastro-esophageal reflux disease without esophagitis: Secondary | ICD-10-CM | POA: Diagnosis not present

## 2021-06-19 DIAGNOSIS — F0150 Vascular dementia without behavioral disturbance: Secondary | ICD-10-CM | POA: Diagnosis not present

## 2021-06-19 DIAGNOSIS — G40909 Epilepsy, unspecified, not intractable, without status epilepticus: Secondary | ICD-10-CM | POA: Diagnosis not present

## 2021-06-21 DIAGNOSIS — G4700 Insomnia, unspecified: Secondary | ICD-10-CM | POA: Diagnosis not present

## 2021-06-21 DIAGNOSIS — G40909 Epilepsy, unspecified, not intractable, without status epilepticus: Secondary | ICD-10-CM | POA: Diagnosis not present

## 2021-06-21 DIAGNOSIS — I10 Essential (primary) hypertension: Secondary | ICD-10-CM | POA: Diagnosis not present

## 2021-06-21 DIAGNOSIS — K219 Gastro-esophageal reflux disease without esophagitis: Secondary | ICD-10-CM | POA: Diagnosis not present

## 2021-06-21 DIAGNOSIS — Z87891 Personal history of nicotine dependence: Secondary | ICD-10-CM | POA: Diagnosis not present

## 2021-06-21 DIAGNOSIS — F0150 Vascular dementia without behavioral disturbance: Secondary | ICD-10-CM | POA: Diagnosis not present

## 2021-06-21 DIAGNOSIS — G309 Alzheimer's disease, unspecified: Secondary | ICD-10-CM | POA: Diagnosis not present

## 2021-06-21 DIAGNOSIS — I69354 Hemiplegia and hemiparesis following cerebral infarction affecting left non-dominant side: Secondary | ICD-10-CM | POA: Diagnosis not present

## 2021-06-21 DIAGNOSIS — I4821 Permanent atrial fibrillation: Secondary | ICD-10-CM | POA: Diagnosis not present

## 2021-06-22 DIAGNOSIS — G40909 Epilepsy, unspecified, not intractable, without status epilepticus: Secondary | ICD-10-CM | POA: Diagnosis not present

## 2021-06-22 DIAGNOSIS — I4821 Permanent atrial fibrillation: Secondary | ICD-10-CM | POA: Diagnosis not present

## 2021-06-22 DIAGNOSIS — F0150 Vascular dementia without behavioral disturbance: Secondary | ICD-10-CM | POA: Diagnosis not present

## 2021-06-22 DIAGNOSIS — I10 Essential (primary) hypertension: Secondary | ICD-10-CM | POA: Diagnosis not present

## 2021-06-22 DIAGNOSIS — Z87891 Personal history of nicotine dependence: Secondary | ICD-10-CM | POA: Diagnosis not present

## 2021-06-22 DIAGNOSIS — G4700 Insomnia, unspecified: Secondary | ICD-10-CM | POA: Diagnosis not present

## 2021-06-22 DIAGNOSIS — G309 Alzheimer's disease, unspecified: Secondary | ICD-10-CM | POA: Diagnosis not present

## 2021-06-22 DIAGNOSIS — K219 Gastro-esophageal reflux disease without esophagitis: Secondary | ICD-10-CM | POA: Diagnosis not present

## 2021-06-22 DIAGNOSIS — I69354 Hemiplegia and hemiparesis following cerebral infarction affecting left non-dominant side: Secondary | ICD-10-CM | POA: Diagnosis not present

## 2021-06-26 DIAGNOSIS — K219 Gastro-esophageal reflux disease without esophagitis: Secondary | ICD-10-CM | POA: Diagnosis not present

## 2021-06-26 DIAGNOSIS — I69354 Hemiplegia and hemiparesis following cerebral infarction affecting left non-dominant side: Secondary | ICD-10-CM | POA: Diagnosis not present

## 2021-06-26 DIAGNOSIS — G309 Alzheimer's disease, unspecified: Secondary | ICD-10-CM | POA: Diagnosis not present

## 2021-06-26 DIAGNOSIS — F0150 Vascular dementia without behavioral disturbance: Secondary | ICD-10-CM | POA: Diagnosis not present

## 2021-06-26 DIAGNOSIS — I4821 Permanent atrial fibrillation: Secondary | ICD-10-CM | POA: Diagnosis not present

## 2021-06-26 DIAGNOSIS — Z87891 Personal history of nicotine dependence: Secondary | ICD-10-CM | POA: Diagnosis not present

## 2021-06-26 DIAGNOSIS — G40909 Epilepsy, unspecified, not intractable, without status epilepticus: Secondary | ICD-10-CM | POA: Diagnosis not present

## 2021-06-26 DIAGNOSIS — G4700 Insomnia, unspecified: Secondary | ICD-10-CM | POA: Diagnosis not present

## 2021-06-26 DIAGNOSIS — I10 Essential (primary) hypertension: Secondary | ICD-10-CM | POA: Diagnosis not present

## 2021-06-28 DIAGNOSIS — G309 Alzheimer's disease, unspecified: Secondary | ICD-10-CM | POA: Diagnosis not present

## 2021-06-28 DIAGNOSIS — I4821 Permanent atrial fibrillation: Secondary | ICD-10-CM | POA: Diagnosis not present

## 2021-06-28 DIAGNOSIS — I69354 Hemiplegia and hemiparesis following cerebral infarction affecting left non-dominant side: Secondary | ICD-10-CM | POA: Diagnosis not present

## 2021-06-28 DIAGNOSIS — G40909 Epilepsy, unspecified, not intractable, without status epilepticus: Secondary | ICD-10-CM | POA: Diagnosis not present

## 2021-06-28 DIAGNOSIS — K219 Gastro-esophageal reflux disease without esophagitis: Secondary | ICD-10-CM | POA: Diagnosis not present

## 2021-06-28 DIAGNOSIS — I10 Essential (primary) hypertension: Secondary | ICD-10-CM | POA: Diagnosis not present

## 2021-06-28 DIAGNOSIS — G4700 Insomnia, unspecified: Secondary | ICD-10-CM | POA: Diagnosis not present

## 2021-06-28 DIAGNOSIS — Z87891 Personal history of nicotine dependence: Secondary | ICD-10-CM | POA: Diagnosis not present

## 2021-06-28 DIAGNOSIS — F0150 Vascular dementia without behavioral disturbance: Secondary | ICD-10-CM | POA: Diagnosis not present

## 2021-07-02 DIAGNOSIS — Z87891 Personal history of nicotine dependence: Secondary | ICD-10-CM | POA: Diagnosis not present

## 2021-07-02 DIAGNOSIS — K219 Gastro-esophageal reflux disease without esophagitis: Secondary | ICD-10-CM | POA: Diagnosis not present

## 2021-07-02 DIAGNOSIS — I4821 Permanent atrial fibrillation: Secondary | ICD-10-CM | POA: Diagnosis not present

## 2021-07-02 DIAGNOSIS — F0150 Vascular dementia without behavioral disturbance: Secondary | ICD-10-CM | POA: Diagnosis not present

## 2021-07-02 DIAGNOSIS — G4700 Insomnia, unspecified: Secondary | ICD-10-CM | POA: Diagnosis not present

## 2021-07-02 DIAGNOSIS — I69354 Hemiplegia and hemiparesis following cerebral infarction affecting left non-dominant side: Secondary | ICD-10-CM | POA: Diagnosis not present

## 2021-07-02 DIAGNOSIS — I10 Essential (primary) hypertension: Secondary | ICD-10-CM | POA: Diagnosis not present

## 2021-07-02 DIAGNOSIS — G40909 Epilepsy, unspecified, not intractable, without status epilepticus: Secondary | ICD-10-CM | POA: Diagnosis not present

## 2021-07-02 DIAGNOSIS — G309 Alzheimer's disease, unspecified: Secondary | ICD-10-CM | POA: Diagnosis not present

## 2021-07-03 DIAGNOSIS — I69354 Hemiplegia and hemiparesis following cerebral infarction affecting left non-dominant side: Secondary | ICD-10-CM | POA: Diagnosis not present

## 2021-07-03 DIAGNOSIS — G4700 Insomnia, unspecified: Secondary | ICD-10-CM | POA: Diagnosis not present

## 2021-07-03 DIAGNOSIS — I4821 Permanent atrial fibrillation: Secondary | ICD-10-CM | POA: Diagnosis not present

## 2021-07-03 DIAGNOSIS — G40909 Epilepsy, unspecified, not intractable, without status epilepticus: Secondary | ICD-10-CM | POA: Diagnosis not present

## 2021-07-03 DIAGNOSIS — Z87891 Personal history of nicotine dependence: Secondary | ICD-10-CM | POA: Diagnosis not present

## 2021-07-03 DIAGNOSIS — K219 Gastro-esophageal reflux disease without esophagitis: Secondary | ICD-10-CM | POA: Diagnosis not present

## 2021-07-03 DIAGNOSIS — I10 Essential (primary) hypertension: Secondary | ICD-10-CM | POA: Diagnosis not present

## 2021-07-03 DIAGNOSIS — G309 Alzheimer's disease, unspecified: Secondary | ICD-10-CM | POA: Diagnosis not present

## 2021-07-03 DIAGNOSIS — F0150 Vascular dementia without behavioral disturbance: Secondary | ICD-10-CM | POA: Diagnosis not present

## 2021-07-04 DIAGNOSIS — I69354 Hemiplegia and hemiparesis following cerebral infarction affecting left non-dominant side: Secondary | ICD-10-CM | POA: Diagnosis not present

## 2021-07-04 DIAGNOSIS — I10 Essential (primary) hypertension: Secondary | ICD-10-CM | POA: Diagnosis not present

## 2021-07-04 DIAGNOSIS — F0150 Vascular dementia without behavioral disturbance: Secondary | ICD-10-CM | POA: Diagnosis not present

## 2021-07-04 DIAGNOSIS — G309 Alzheimer's disease, unspecified: Secondary | ICD-10-CM | POA: Diagnosis not present

## 2021-07-04 DIAGNOSIS — I4821 Permanent atrial fibrillation: Secondary | ICD-10-CM | POA: Diagnosis not present

## 2021-07-04 DIAGNOSIS — K219 Gastro-esophageal reflux disease without esophagitis: Secondary | ICD-10-CM | POA: Diagnosis not present

## 2021-07-04 DIAGNOSIS — G40909 Epilepsy, unspecified, not intractable, without status epilepticus: Secondary | ICD-10-CM | POA: Diagnosis not present

## 2021-07-05 DIAGNOSIS — Z87891 Personal history of nicotine dependence: Secondary | ICD-10-CM | POA: Diagnosis not present

## 2021-07-05 DIAGNOSIS — I10 Essential (primary) hypertension: Secondary | ICD-10-CM | POA: Diagnosis not present

## 2021-07-05 DIAGNOSIS — G40909 Epilepsy, unspecified, not intractable, without status epilepticus: Secondary | ICD-10-CM | POA: Diagnosis not present

## 2021-07-05 DIAGNOSIS — I69354 Hemiplegia and hemiparesis following cerebral infarction affecting left non-dominant side: Secondary | ICD-10-CM | POA: Diagnosis not present

## 2021-07-05 DIAGNOSIS — I4821 Permanent atrial fibrillation: Secondary | ICD-10-CM | POA: Diagnosis not present

## 2021-07-05 DIAGNOSIS — G4700 Insomnia, unspecified: Secondary | ICD-10-CM | POA: Diagnosis not present

## 2021-07-05 DIAGNOSIS — F0150 Vascular dementia without behavioral disturbance: Secondary | ICD-10-CM | POA: Diagnosis not present

## 2021-07-05 DIAGNOSIS — K219 Gastro-esophageal reflux disease without esophagitis: Secondary | ICD-10-CM | POA: Diagnosis not present

## 2021-07-05 DIAGNOSIS — G309 Alzheimer's disease, unspecified: Secondary | ICD-10-CM | POA: Diagnosis not present

## 2021-07-06 ENCOUNTER — Emergency Department: Payer: Medicare HMO

## 2021-07-06 ENCOUNTER — Emergency Department
Admission: EM | Admit: 2021-07-06 | Discharge: 2021-07-06 | Disposition: A | Discharge status: Home / Self Care | Payer: Medicare HMO | Attending: Emergency Medicine | Admitting: Emergency Medicine

## 2021-07-06 ENCOUNTER — Other Ambulatory Visit: Payer: Self-pay

## 2021-07-06 DIAGNOSIS — N3000 Acute cystitis without hematuria: Secondary | ICD-10-CM

## 2021-07-06 DIAGNOSIS — Z79899 Other long term (current) drug therapy: Secondary | ICD-10-CM | POA: Diagnosis not present

## 2021-07-06 DIAGNOSIS — R531 Weakness: Secondary | ICD-10-CM | POA: Diagnosis not present

## 2021-07-06 DIAGNOSIS — Z7901 Long term (current) use of anticoagulants: Secondary | ICD-10-CM | POA: Insufficient documentation

## 2021-07-06 DIAGNOSIS — F17200 Nicotine dependence, unspecified, uncomplicated: Secondary | ICD-10-CM | POA: Diagnosis not present

## 2021-07-06 DIAGNOSIS — I639 Cerebral infarction, unspecified: Secondary | ICD-10-CM | POA: Diagnosis not present

## 2021-07-06 DIAGNOSIS — F0390 Unspecified dementia without behavioral disturbance: Secondary | ICD-10-CM | POA: Diagnosis not present

## 2021-07-06 DIAGNOSIS — R29810 Facial weakness: Secondary | ICD-10-CM | POA: Diagnosis not present

## 2021-07-06 DIAGNOSIS — Z8673 Personal history of transient ischemic attack (TIA), and cerebral infarction without residual deficits: Secondary | ICD-10-CM | POA: Diagnosis not present

## 2021-07-06 DIAGNOSIS — Z20822 Contact with and (suspected) exposure to covid-19: Secondary | ICD-10-CM | POA: Diagnosis not present

## 2021-07-06 DIAGNOSIS — R29898 Other symptoms and signs involving the musculoskeletal system: Secondary | ICD-10-CM | POA: Diagnosis not present

## 2021-07-06 DIAGNOSIS — R29818 Other symptoms and signs involving the nervous system: Secondary | ICD-10-CM | POA: Diagnosis not present

## 2021-07-06 DIAGNOSIS — I10 Essential (primary) hypertension: Secondary | ICD-10-CM | POA: Diagnosis not present

## 2021-07-06 LAB — RESP PANEL BY RT-PCR (FLU A&B, COVID) ARPGX2
Influenza A by PCR: NEGATIVE
Influenza B by PCR: NEGATIVE
SARS Coronavirus 2 by RT PCR: NEGATIVE

## 2021-07-06 LAB — COMPREHENSIVE METABOLIC PANEL
ALT: 10 U/L (ref 0–44)
AST: 24 U/L (ref 15–41)
Albumin: 3.6 g/dL (ref 3.5–5.0)
Alkaline Phosphatase: 52 U/L (ref 38–126)
Anion gap: 10 (ref 5–15)
BUN: 30 mg/dL — ABNORMAL HIGH (ref 8–23)
CO2: 26 mmol/L (ref 22–32)
Calcium: 10 mg/dL (ref 8.9–10.3)
Chloride: 102 mmol/L (ref 98–111)
Creatinine, Ser: 1.24 mg/dL (ref 0.61–1.24)
GFR, Estimated: 58 mL/min — ABNORMAL LOW (ref >60)
Glucose, Bld: 93 mg/dL (ref 70–99)
Potassium: 4 mmol/L (ref 3.5–5.1)
Sodium: 138 mmol/L (ref 135–145)
Total Bilirubin: 0.8 mg/dL (ref 0.3–1.2)
Total Protein: 6.8 g/dL (ref 6.5–8.1)

## 2021-07-06 LAB — CBC WITH DIFFERENTIAL/PLATELET
Abs Immature Granulocytes: 0.04 10*3/uL (ref 0.00–0.07)
Basophils Absolute: 0 10*3/uL (ref 0.0–0.1)
Basophils Relative: 1 %
Eosinophils Absolute: 0.3 10*3/uL (ref 0.0–0.5)
Eosinophils Relative: 3 %
HCT: 35.9 % — ABNORMAL LOW (ref 39.0–52.0)
Hemoglobin: 12.6 g/dL — ABNORMAL LOW (ref 13.0–17.0)
Immature Granulocytes: 1 %
Lymphocytes Relative: 16 %
Lymphs Abs: 1.4 10*3/uL (ref 0.7–4.0)
MCH: 34.9 pg — ABNORMAL HIGH (ref 26.0–34.0)
MCHC: 35.1 g/dL (ref 30.0–36.0)
MCV: 99.4 fL (ref 80.0–100.0)
Monocytes Absolute: 1 10*3/uL (ref 0.1–1.0)
Monocytes Relative: 12 %
Neutro Abs: 5.8 10*3/uL (ref 1.7–7.7)
Neutrophils Relative %: 67 %
Platelets: 256 10*3/uL (ref 150–400)
RBC: 3.61 MIL/uL — ABNORMAL LOW (ref 4.22–5.81)
RDW: 17.1 % — ABNORMAL HIGH (ref 11.5–15.5)
WBC: 8.6 10*3/uL (ref 4.0–10.5)
nRBC: 0 % (ref 0.0–0.2)

## 2021-07-06 LAB — URINALYSIS, COMPLETE (UACMP) WITH MICROSCOPIC
Bilirubin Urine: NEGATIVE
Glucose, UA: NEGATIVE mg/dL
Hgb urine dipstick: NEGATIVE
Ketones, ur: NEGATIVE mg/dL
Nitrite: POSITIVE — AB
Protein, ur: NEGATIVE mg/dL
Specific Gravity, Urine: 1.011 (ref 1.005–1.030)
Squamous Epithelial / HPF: NONE SEEN (ref 0–5)
WBC, UA: >50 WBC/hpf — ABNORMAL HIGH (ref 0–5)
pH: 7 (ref 5.0–8.0)

## 2021-07-06 LAB — TROPONIN I (HIGH SENSITIVITY)
Troponin I (High Sensitivity): 23 ng/L — ABNORMAL HIGH (ref <18)
Troponin I (High Sensitivity): 22 ng/L — ABNORMAL HIGH (ref <18)

## 2021-07-06 MED ORDER — FOSFOMYCIN TROMETHAMINE 3 G PO PACK
3.0000 g | PACK | Freq: Once | ORAL | Status: AC
  Administered 2021-07-06: 3 g via ORAL

## 2021-07-06 MED ORDER — LACTATED RINGERS IV BOLUS
1000.0000 mL | Freq: Once | INTRAVENOUS | Status: AC
  Administered 2021-07-06: 1000 mL via INTRAVENOUS

## 2021-07-06 NOTE — ED Triage Notes (Signed)
Pt here from [facility] with left side weakness and facial droop. Pt was having weakness and droop since yesterday but pt did not want to come the ED. Pt went to neurology for a appt today when he was sent to the ED for his symptoms.

## 2021-07-06 NOTE — ED Provider Notes (Addendum)
[facility]  ____________________________________________   Event Date/Time   First MD Initiated Contact with Patient 07/06/21 1159     (approximate)  I have reviewed the triage vital signs and the nursing notes.   HISTORY  Chief Complaint Weakness and Facial Droop    HPI Richard Carter is a 80 y.o. male prior embolic CVA, alcohol use, dementia, paroxysmal A. fib on Eliquis, hypertension and hyperlipidemia who presents with weakness.  The patient's neighbor and caregiver tells me that yesterday when the visiting nurse came over around 1 PM she was concerned that he had significant facial droop.  He also notes that his right side was weaker than normal and that he was using his left arm which is the side that he had a prior stroke on to do things on the right.  The patient did not want to come to the hospital however.  They then went to neurology outpatient appointment today and were seen by the PA who was concerned about his exam and he was referred to the emergency department.  The patient tells me that he does not really notice any weakness but when pushed does say that his left side is weaker than right and thinks that this has been going on since June.  He denies new aphasia visual change or numbness.  He denies chest pain or difficulty breathing.  He denies fevers or chills.      Past Medical History:  Diagnosis Date   Alcohol abuse    Dementia (HCC)    GERD (gastroesophageal reflux disease)    HLD (hyperlipidemia)    HTN (hypertension)     Patient Active Problem List   Diagnosis Date Noted   Acute CVA (cerebrovascular accident) (HCC) 05/07/2021   Non compliance w medication regimen 05/07/2021   Dementia (HCC) 05/07/2021   AF (paroxysmal atrial fibrillation) (HCC) 05/07/2021   HTN (hypertension) 11/29/2020   Alcohol abuse 11/29/2020   AKI (acute kidney injury) (HCC) 11/29/2020   Atrial flutter with rapid ventricular response (HCC) 11/29/2020    Seizure (HCC) 02/22/2020   Pubic ramus fracture (HCC) 07/29/2019   Acetabular fracture (HCC) 07/29/2019   HLD (hyperlipidemia) 07/29/2019   GERD (gastroesophageal reflux disease) 07/29/2019   UTI (urinary tract infection) 07/29/2019    Past Surgical History:  Procedure Laterality Date   skin cyst removal      Prior to Admission medications   Medication Sig Start Date End Date Taking? Authorizing Provider  amLODipine (NORVASC) 5 MG tablet Take 1 tablet (5 mg total) by mouth daily. 05/12/21   Sreenath, Jonelle Sports, MD  apixaban (ELIQUIS) 5 MG TABS tablet Take 1 tablet (5 mg total) by mouth 2 (two) times daily. 05/11/21   Tresa Moore, MD  atorvastatin (LIPITOR) 20 MG tablet Take 20 mg by mouth daily.    [provider]  levETIRAcetam (KEPPRA) 500 MG tablet Take 1 tablet (500 mg total) by mouth 2 (two) times daily. 12/01/20   Danford, Earl Lites, MD  losartan (COZAAR) 25 MG tablet Take 25 mg by mouth daily.    [provider]  magnesium oxide (MAG-OX) 400 MG tablet Take 400 mg by mouth daily.    [provider]  metoprolol succinate (TOPROL-XL) 50 MG 24 hr tablet Take 1 tablet (50 mg total) by mouth daily. 12/01/20   Danford, Earl Lites, MD  nicotine (NICODERM CQ - DOSED IN MG/24 HOURS) 14 mg/24hr patch Place 1 patch (14 mg total) onto the skin daily. 05/12/21  Lolita Patella B, MD  omeprazole (PRILOSEC) 40 MG capsule Take 40 mg by mouth daily.    [provider]  traZODone (DESYREL) 100 MG tablet Take 1 tablet (100 mg total) by mouth at bedtime. 05/11/21   Tresa Moore, MD    Allergies Patient has no known allergies.  Family History  Problem Relation Age of Onset   Stroke Mother    Cancer Brother     Social History Social History   Tobacco Use   Smoking status: Every Day   Smokeless tobacco: Never  Vaping Use   Vaping Use: Never used  Substance Use Topics   Alcohol use: No    Review of Systems   Review of Systems   Constitutional:  Negative for chills and fever.  Respiratory:  Negative for shortness of breath.   Cardiovascular:  Negative for chest pain.  Gastrointestinal:  Negative for abdominal pain, nausea and vomiting.  Neurological:  Positive for facial asymmetry and weakness. Negative for numbness and headaches.  All other systems reviewed and are negative.  Physical Exam Updated Vital Signs BP (!) 149/77 (BP Location: Right Arm)   Pulse (!) 45   Temp 97.9 F (36.6 C) (Oral)   Resp 17   Ht 5\' 7"  (1.702 m)   Wt 62.1 kg   SpO2 95%   BMI 21.44 kg/m   Physical Exam Vitals and nursing note reviewed.  Constitutional:      General: He is not in acute distress.    Appearance: Normal appearance.  HENT:     Head: Normocephalic and atraumatic.  Eyes:     General: No scleral icterus.    Conjunctiva/sclera: Conjunctivae normal.  Pulmonary:     Effort: Pulmonary effort is normal. No respiratory distress.     Breath sounds: Normal breath sounds. No wheezing.  Musculoskeletal:        General: No deformity or signs of injury.     Cervical back: Normal range of motion.  Skin:    Coloration: Skin is not jaundiced or pale.  Neurological:     General: No focal deficit present.     Mental Status: He is alert.     Comments: Patient is oriented to person and place but thinks the year is 2002 Pupils are equal and reactive, extraocular movements are intact Flattening of the left nasolabial fold With pronator drift the left fifth digit abducts and the arm elevates, no drift on the right 5 out of 5 strength with bicep extension bilaterally 5 out of 5 strength in the bilateral lower extremities Sensation is grossly intact in the bilateral upper and lower extremities Finger-nose-finger intact bilaterally  Psychiatric:        Mood and Affect: Mood normal.        Behavior: Behavior normal.     LABS (all labs ordered are listed, but only abnormal results are displayed)  Labs Reviewed  CBC WITH  DIFFERENTIAL/PLATELET - Abnormal; Notable for the following components:      Result Value   RBC 3.61 (*)    Hemoglobin 12.6 (*)    HCT 35.9 (*)    MCH 34.9 (*)    RDW 17.1 (*)    All other components within normal limits  URINALYSIS, COMPLETE (UACMP) WITH MICROSCOPIC - Abnormal; Notable for the following components:   Color, Urine YELLOW (*)    APPearance CLOUDY (*)    Nitrite POSITIVE (*)    Leukocytes,Ua LARGE (*)    WBC, UA >50 (*)  Bacteria, UA MANY (*)    All other components within normal limits  RESP PANEL BY RT-PCR (FLU A&B, COVID) ARPGX2  COMPREHENSIVE METABOLIC PANEL  TROPONIN I (HIGH SENSITIVITY)  TROPONIN I (HIGH SENSITIVITY)   ____________________________________________  EKG  Ectopic atrial rhythm normal intervals, normal axis, no acute ischemic changes ____________________________________________  RADIOLOGY Ky Barban, personally viewed and evaluated these images (plain radiographs) as part of my medical decision making, as well as reviewing the written report by the radiologist.  ED MD interpretation: CT head did not show any acute changes    ____________________________________________   PROCEDURES  Procedure(s) performed (including Critical Care):  Procedures   ____________________________________________   INITIAL IMPRESSION / ASSESSMENT AND PLAN / ED COURSE     The patient is an 80 year old male with a history of recent embolic CVA with primarily left-sided weakness and left-sided facial droop who was sent from neurology clinic due to concern for focal weakness.  Patient's caregiver tells me that he had significant drooping of his face yesterday although he cannot recall which side as well as right arm weakness which is new for him.  Unfortunately I am not able to see the neurology note today but will attempt to call the PA who saw him to get more collateral.  The patient has minimal complaints and on my exam I see that he has a  left-sided facial droop as well as some left upward drift but no other acute findings.  These seem to be consistent with his prior strokes.  He may have had a TIA.  We will plan to discuss with neurology.  Unfortunately was not able to get in touch with the PA who saw the patient.  Spoke with Dr. Malvin Johns at [facility] who notes that there was some concern for an acute CVA.  Also spoke with neurologist on-call here who recommends getting an MRI to further clarify.   MRI of the brain shows small punctate infarction in the lateral ventricle.  I discussed with Dr. Otelia Limes neurologist on-call who feels like this very small infarction is unlikely to cause symptoms and that he does not need a redo of his prior CVA work-up that he recently had.  He is on Eliquis so he is already medically optimized.  Patient's UA does show nitrates and many leuks and will give a dose of fosfomycin.  His BUN is mildly elevated will give a liter of fluid.  He will need a repeat troponin.  Otherwise he can be discharged with neurology follow-up.  Clinical Course as of 07/06/21 1526  Fri Jul 06, 2021  1318 IMPRESSION: Atrophy and chronic microvascular ischemic change. No acute abnormality by CT.   [KM]    Clinical Course User Index [KM] Georga Hacking, MD     ____________________________________________   FINAL CLINICAL IMPRESSION(S) / ED DIAGNOSES  Final diagnoses:  Weakness     ED Discharge Orders     None        Note:  This document was prepared using Dragon voice recognition software and may include unintentional dictation errors.    Georga Hacking, MD 07/06/21 1442    Georga Hacking, MD 07/06/21 [phone]

## 2021-07-06 NOTE — ED Notes (Signed)
Pt hard stick, called lab to draw pts repeat trop.

## 2021-07-06 NOTE — ED Provider Notes (Signed)
I assumed care of this patient approximately 1500.  Please have providers note for full details on patient's initial evaluation assessment.  In brief patient presents with a history of recent prior embolic stroke from A. fib, alcohol use, dementia with concern for some new right-sided weakness as patient had some left-sided weakness from his prior stroke.  He is anticoagulant Eliquis.  MRI shows very small acute punctate infarcts.  After discussion with neurology patient is already medically optimized and there is no recommendation for additional work-up intervention at this time as patient is not a candidate for any tPA or interventional therapy.  Per neurology they recommend outpatient follow-up.  Per off going provider patient will require repeat troponin initial slightly elevated and if this is stable or downtrending he will be safe to discharge with outpatient PCP and neurology follow-up.  Repeat troponin is downtrending.  Discharged stable condition.   Gilles Chiquito, MD 07/06/21 [phone]

## 2021-07-08 DIAGNOSIS — G4700 Insomnia, unspecified: Secondary | ICD-10-CM | POA: Diagnosis not present

## 2021-07-08 DIAGNOSIS — G309 Alzheimer's disease, unspecified: Secondary | ICD-10-CM | POA: Diagnosis not present

## 2021-07-08 DIAGNOSIS — K219 Gastro-esophageal reflux disease without esophagitis: Secondary | ICD-10-CM | POA: Diagnosis not present

## 2021-07-08 DIAGNOSIS — I4821 Permanent atrial fibrillation: Secondary | ICD-10-CM | POA: Diagnosis not present

## 2021-07-08 DIAGNOSIS — Z87891 Personal history of nicotine dependence: Secondary | ICD-10-CM | POA: Diagnosis not present

## 2021-07-08 DIAGNOSIS — I10 Essential (primary) hypertension: Secondary | ICD-10-CM | POA: Diagnosis not present

## 2021-07-08 DIAGNOSIS — G40909 Epilepsy, unspecified, not intractable, without status epilepticus: Secondary | ICD-10-CM | POA: Diagnosis not present

## 2021-07-08 DIAGNOSIS — F0150 Vascular dementia without behavioral disturbance: Secondary | ICD-10-CM | POA: Diagnosis not present

## 2021-07-08 DIAGNOSIS — I69354 Hemiplegia and hemiparesis following cerebral infarction affecting left non-dominant side: Secondary | ICD-10-CM | POA: Diagnosis not present

## 2021-07-09 LAB — URINE CULTURE: Culture: >=100000 — AB

## 2021-07-10 DIAGNOSIS — G309 Alzheimer's disease, unspecified: Secondary | ICD-10-CM | POA: Diagnosis not present

## 2021-07-10 DIAGNOSIS — F0150 Vascular dementia without behavioral disturbance: Secondary | ICD-10-CM | POA: Diagnosis not present

## 2021-07-10 DIAGNOSIS — G40909 Epilepsy, unspecified, not intractable, without status epilepticus: Secondary | ICD-10-CM | POA: Diagnosis not present

## 2021-07-10 DIAGNOSIS — I4821 Permanent atrial fibrillation: Secondary | ICD-10-CM | POA: Diagnosis not present

## 2021-07-10 DIAGNOSIS — I10 Essential (primary) hypertension: Secondary | ICD-10-CM | POA: Diagnosis not present

## 2021-07-10 DIAGNOSIS — K219 Gastro-esophageal reflux disease without esophagitis: Secondary | ICD-10-CM | POA: Diagnosis not present

## 2021-07-10 DIAGNOSIS — G4700 Insomnia, unspecified: Secondary | ICD-10-CM | POA: Diagnosis not present

## 2021-07-10 DIAGNOSIS — Z87891 Personal history of nicotine dependence: Secondary | ICD-10-CM | POA: Diagnosis not present

## 2021-07-10 DIAGNOSIS — I69354 Hemiplegia and hemiparesis following cerebral infarction affecting left non-dominant side: Secondary | ICD-10-CM | POA: Diagnosis not present

## 2021-07-13 DIAGNOSIS — G309 Alzheimer's disease, unspecified: Secondary | ICD-10-CM | POA: Diagnosis not present

## 2021-07-13 DIAGNOSIS — Z87891 Personal history of nicotine dependence: Secondary | ICD-10-CM | POA: Diagnosis not present

## 2021-07-13 DIAGNOSIS — I69354 Hemiplegia and hemiparesis following cerebral infarction affecting left non-dominant side: Secondary | ICD-10-CM | POA: Diagnosis not present

## 2021-07-13 DIAGNOSIS — K219 Gastro-esophageal reflux disease without esophagitis: Secondary | ICD-10-CM | POA: Diagnosis not present

## 2021-07-13 DIAGNOSIS — G40909 Epilepsy, unspecified, not intractable, without status epilepticus: Secondary | ICD-10-CM | POA: Diagnosis not present

## 2021-07-13 DIAGNOSIS — I4821 Permanent atrial fibrillation: Secondary | ICD-10-CM | POA: Diagnosis not present

## 2021-07-13 DIAGNOSIS — G4700 Insomnia, unspecified: Secondary | ICD-10-CM | POA: Diagnosis not present

## 2021-07-13 DIAGNOSIS — I10 Essential (primary) hypertension: Secondary | ICD-10-CM | POA: Diagnosis not present

## 2021-07-13 DIAGNOSIS — F0150 Vascular dementia without behavioral disturbance: Secondary | ICD-10-CM | POA: Diagnosis not present

## 2021-07-17 DIAGNOSIS — G40909 Epilepsy, unspecified, not intractable, without status epilepticus: Secondary | ICD-10-CM | POA: Diagnosis not present

## 2021-07-17 DIAGNOSIS — Z87891 Personal history of nicotine dependence: Secondary | ICD-10-CM | POA: Diagnosis not present

## 2021-07-17 DIAGNOSIS — G4700 Insomnia, unspecified: Secondary | ICD-10-CM | POA: Diagnosis not present

## 2021-07-17 DIAGNOSIS — I10 Essential (primary) hypertension: Secondary | ICD-10-CM | POA: Diagnosis not present

## 2021-07-17 DIAGNOSIS — I69354 Hemiplegia and hemiparesis following cerebral infarction affecting left non-dominant side: Secondary | ICD-10-CM | POA: Diagnosis not present

## 2021-07-17 DIAGNOSIS — I4821 Permanent atrial fibrillation: Secondary | ICD-10-CM | POA: Diagnosis not present

## 2021-07-17 DIAGNOSIS — G309 Alzheimer's disease, unspecified: Secondary | ICD-10-CM | POA: Diagnosis not present

## 2021-07-17 DIAGNOSIS — K219 Gastro-esophageal reflux disease without esophagitis: Secondary | ICD-10-CM | POA: Diagnosis not present

## 2021-07-17 DIAGNOSIS — F0150 Vascular dementia without behavioral disturbance: Secondary | ICD-10-CM | POA: Diagnosis not present

## 2021-07-20 DIAGNOSIS — I4821 Permanent atrial fibrillation: Secondary | ICD-10-CM | POA: Diagnosis not present

## 2021-07-20 DIAGNOSIS — F0150 Vascular dementia without behavioral disturbance: Secondary | ICD-10-CM | POA: Diagnosis not present

## 2021-07-20 DIAGNOSIS — I10 Essential (primary) hypertension: Secondary | ICD-10-CM | POA: Diagnosis not present

## 2021-07-20 DIAGNOSIS — G309 Alzheimer's disease, unspecified: Secondary | ICD-10-CM | POA: Diagnosis not present

## 2021-07-20 DIAGNOSIS — Z87891 Personal history of nicotine dependence: Secondary | ICD-10-CM | POA: Diagnosis not present

## 2021-07-20 DIAGNOSIS — I69354 Hemiplegia and hemiparesis following cerebral infarction affecting left non-dominant side: Secondary | ICD-10-CM | POA: Diagnosis not present

## 2021-07-20 DIAGNOSIS — K219 Gastro-esophageal reflux disease without esophagitis: Secondary | ICD-10-CM | POA: Diagnosis not present

## 2021-07-20 DIAGNOSIS — G4700 Insomnia, unspecified: Secondary | ICD-10-CM | POA: Diagnosis not present

## 2021-07-20 DIAGNOSIS — G40909 Epilepsy, unspecified, not intractable, without status epilepticus: Secondary | ICD-10-CM | POA: Diagnosis not present

## 2021-07-26 DIAGNOSIS — I4821 Permanent atrial fibrillation: Secondary | ICD-10-CM | POA: Diagnosis not present

## 2021-07-26 DIAGNOSIS — K219 Gastro-esophageal reflux disease without esophagitis: Secondary | ICD-10-CM | POA: Diagnosis not present

## 2021-07-26 DIAGNOSIS — G40909 Epilepsy, unspecified, not intractable, without status epilepticus: Secondary | ICD-10-CM | POA: Diagnosis not present

## 2021-07-26 DIAGNOSIS — G309 Alzheimer's disease, unspecified: Secondary | ICD-10-CM | POA: Diagnosis not present

## 2021-07-26 DIAGNOSIS — G4700 Insomnia, unspecified: Secondary | ICD-10-CM | POA: Diagnosis not present

## 2021-07-26 DIAGNOSIS — I69354 Hemiplegia and hemiparesis following cerebral infarction affecting left non-dominant side: Secondary | ICD-10-CM | POA: Diagnosis not present

## 2021-07-26 DIAGNOSIS — F0150 Vascular dementia without behavioral disturbance: Secondary | ICD-10-CM | POA: Diagnosis not present

## 2021-07-26 DIAGNOSIS — Z87891 Personal history of nicotine dependence: Secondary | ICD-10-CM | POA: Diagnosis not present

## 2021-07-26 DIAGNOSIS — I10 Essential (primary) hypertension: Secondary | ICD-10-CM | POA: Diagnosis not present

## 2021-07-27 DIAGNOSIS — I69354 Hemiplegia and hemiparesis following cerebral infarction affecting left non-dominant side: Secondary | ICD-10-CM | POA: Diagnosis not present

## 2021-07-27 DIAGNOSIS — G4700 Insomnia, unspecified: Secondary | ICD-10-CM | POA: Diagnosis not present

## 2021-07-27 DIAGNOSIS — F0150 Vascular dementia without behavioral disturbance: Secondary | ICD-10-CM | POA: Diagnosis not present

## 2021-07-27 DIAGNOSIS — G40909 Epilepsy, unspecified, not intractable, without status epilepticus: Secondary | ICD-10-CM | POA: Diagnosis not present

## 2021-07-27 DIAGNOSIS — G309 Alzheimer's disease, unspecified: Secondary | ICD-10-CM | POA: Diagnosis not present

## 2021-07-27 DIAGNOSIS — I10 Essential (primary) hypertension: Secondary | ICD-10-CM | POA: Diagnosis not present

## 2021-07-27 DIAGNOSIS — K219 Gastro-esophageal reflux disease without esophagitis: Secondary | ICD-10-CM | POA: Diagnosis not present

## 2021-07-27 DIAGNOSIS — I4821 Permanent atrial fibrillation: Secondary | ICD-10-CM | POA: Diagnosis not present

## 2021-07-27 DIAGNOSIS — Z87891 Personal history of nicotine dependence: Secondary | ICD-10-CM | POA: Diagnosis not present

## 2021-07-31 DIAGNOSIS — I4821 Permanent atrial fibrillation: Secondary | ICD-10-CM | POA: Diagnosis not present

## 2021-07-31 DIAGNOSIS — G4700 Insomnia, unspecified: Secondary | ICD-10-CM | POA: Diagnosis not present

## 2021-07-31 DIAGNOSIS — I10 Essential (primary) hypertension: Secondary | ICD-10-CM | POA: Diagnosis not present

## 2021-07-31 DIAGNOSIS — F0150 Vascular dementia without behavioral disturbance: Secondary | ICD-10-CM | POA: Diagnosis not present

## 2021-07-31 DIAGNOSIS — I69354 Hemiplegia and hemiparesis following cerebral infarction affecting left non-dominant side: Secondary | ICD-10-CM | POA: Diagnosis not present

## 2021-07-31 DIAGNOSIS — Z87891 Personal history of nicotine dependence: Secondary | ICD-10-CM | POA: Diagnosis not present

## 2021-07-31 DIAGNOSIS — G40909 Epilepsy, unspecified, not intractable, without status epilepticus: Secondary | ICD-10-CM | POA: Diagnosis not present

## 2021-07-31 DIAGNOSIS — G309 Alzheimer's disease, unspecified: Secondary | ICD-10-CM | POA: Diagnosis not present

## 2021-07-31 DIAGNOSIS — K219 Gastro-esophageal reflux disease without esophagitis: Secondary | ICD-10-CM | POA: Diagnosis not present

## 2021-08-02 DIAGNOSIS — G309 Alzheimer's disease, unspecified: Secondary | ICD-10-CM | POA: Diagnosis not present

## 2021-08-02 DIAGNOSIS — Z87891 Personal history of nicotine dependence: Secondary | ICD-10-CM | POA: Diagnosis not present

## 2021-08-02 DIAGNOSIS — F0150 Vascular dementia without behavioral disturbance: Secondary | ICD-10-CM | POA: Diagnosis not present

## 2021-08-02 DIAGNOSIS — G4700 Insomnia, unspecified: Secondary | ICD-10-CM | POA: Diagnosis not present

## 2021-08-02 DIAGNOSIS — K219 Gastro-esophageal reflux disease without esophagitis: Secondary | ICD-10-CM | POA: Diagnosis not present

## 2021-08-02 DIAGNOSIS — I69354 Hemiplegia and hemiparesis following cerebral infarction affecting left non-dominant side: Secondary | ICD-10-CM | POA: Diagnosis not present

## 2021-08-02 DIAGNOSIS — I10 Essential (primary) hypertension: Secondary | ICD-10-CM | POA: Diagnosis not present

## 2021-08-02 DIAGNOSIS — I4821 Permanent atrial fibrillation: Secondary | ICD-10-CM | POA: Diagnosis not present

## 2021-08-02 DIAGNOSIS — G40909 Epilepsy, unspecified, not intractable, without status epilepticus: Secondary | ICD-10-CM | POA: Diagnosis not present

## 2021-08-03 DIAGNOSIS — Z87891 Personal history of nicotine dependence: Secondary | ICD-10-CM | POA: Diagnosis not present

## 2021-08-03 DIAGNOSIS — G309 Alzheimer's disease, unspecified: Secondary | ICD-10-CM | POA: Diagnosis not present

## 2021-08-03 DIAGNOSIS — F0150 Vascular dementia without behavioral disturbance: Secondary | ICD-10-CM | POA: Diagnosis not present

## 2021-08-03 DIAGNOSIS — G40909 Epilepsy, unspecified, not intractable, without status epilepticus: Secondary | ICD-10-CM | POA: Diagnosis not present

## 2021-08-03 DIAGNOSIS — I69354 Hemiplegia and hemiparesis following cerebral infarction affecting left non-dominant side: Secondary | ICD-10-CM | POA: Diagnosis not present

## 2021-08-03 DIAGNOSIS — K219 Gastro-esophageal reflux disease without esophagitis: Secondary | ICD-10-CM | POA: Diagnosis not present

## 2021-08-03 DIAGNOSIS — I4821 Permanent atrial fibrillation: Secondary | ICD-10-CM | POA: Diagnosis not present

## 2021-08-03 DIAGNOSIS — G4700 Insomnia, unspecified: Secondary | ICD-10-CM | POA: Diagnosis not present

## 2021-08-03 DIAGNOSIS — I10 Essential (primary) hypertension: Secondary | ICD-10-CM | POA: Diagnosis not present

## 2021-08-06 DIAGNOSIS — F0150 Vascular dementia without behavioral disturbance: Secondary | ICD-10-CM | POA: Diagnosis not present

## 2021-08-06 DIAGNOSIS — Z87891 Personal history of nicotine dependence: Secondary | ICD-10-CM | POA: Diagnosis not present

## 2021-08-06 DIAGNOSIS — I4821 Permanent atrial fibrillation: Secondary | ICD-10-CM | POA: Diagnosis not present

## 2021-08-06 DIAGNOSIS — G4700 Insomnia, unspecified: Secondary | ICD-10-CM | POA: Diagnosis not present

## 2021-08-06 DIAGNOSIS — I69354 Hemiplegia and hemiparesis following cerebral infarction affecting left non-dominant side: Secondary | ICD-10-CM | POA: Diagnosis not present

## 2021-08-06 DIAGNOSIS — G40909 Epilepsy, unspecified, not intractable, without status epilepticus: Secondary | ICD-10-CM | POA: Diagnosis not present

## 2021-08-06 DIAGNOSIS — K219 Gastro-esophageal reflux disease without esophagitis: Secondary | ICD-10-CM | POA: Diagnosis not present

## 2021-08-06 DIAGNOSIS — I10 Essential (primary) hypertension: Secondary | ICD-10-CM | POA: Diagnosis not present

## 2021-08-06 DIAGNOSIS — G309 Alzheimer's disease, unspecified: Secondary | ICD-10-CM | POA: Diagnosis not present

## 2021-08-07 DIAGNOSIS — G40909 Epilepsy, unspecified, not intractable, without status epilepticus: Secondary | ICD-10-CM | POA: Diagnosis not present

## 2021-08-07 DIAGNOSIS — I69354 Hemiplegia and hemiparesis following cerebral infarction affecting left non-dominant side: Secondary | ICD-10-CM | POA: Diagnosis not present

## 2021-08-07 DIAGNOSIS — I4821 Permanent atrial fibrillation: Secondary | ICD-10-CM | POA: Diagnosis not present

## 2021-08-07 DIAGNOSIS — G4700 Insomnia, unspecified: Secondary | ICD-10-CM | POA: Diagnosis not present

## 2021-08-07 DIAGNOSIS — F0150 Vascular dementia without behavioral disturbance: Secondary | ICD-10-CM | POA: Diagnosis not present

## 2021-08-07 DIAGNOSIS — G309 Alzheimer's disease, unspecified: Secondary | ICD-10-CM | POA: Diagnosis not present

## 2021-08-07 DIAGNOSIS — I10 Essential (primary) hypertension: Secondary | ICD-10-CM | POA: Diagnosis not present

## 2021-08-07 DIAGNOSIS — K219 Gastro-esophageal reflux disease without esophagitis: Secondary | ICD-10-CM | POA: Diagnosis not present

## 2021-08-07 DIAGNOSIS — Z87891 Personal history of nicotine dependence: Secondary | ICD-10-CM | POA: Diagnosis not present

## 2021-08-09 DIAGNOSIS — Z87891 Personal history of nicotine dependence: Secondary | ICD-10-CM | POA: Diagnosis not present

## 2021-08-09 DIAGNOSIS — I69354 Hemiplegia and hemiparesis following cerebral infarction affecting left non-dominant side: Secondary | ICD-10-CM | POA: Diagnosis not present

## 2021-08-09 DIAGNOSIS — K219 Gastro-esophageal reflux disease without esophagitis: Secondary | ICD-10-CM | POA: Diagnosis not present

## 2021-08-09 DIAGNOSIS — I10 Essential (primary) hypertension: Secondary | ICD-10-CM | POA: Diagnosis not present

## 2021-08-09 DIAGNOSIS — G40909 Epilepsy, unspecified, not intractable, without status epilepticus: Secondary | ICD-10-CM | POA: Diagnosis not present

## 2021-08-09 DIAGNOSIS — F0150 Vascular dementia without behavioral disturbance: Secondary | ICD-10-CM | POA: Diagnosis not present

## 2021-08-09 DIAGNOSIS — I4821 Permanent atrial fibrillation: Secondary | ICD-10-CM | POA: Diagnosis not present

## 2021-08-09 DIAGNOSIS — G4700 Insomnia, unspecified: Secondary | ICD-10-CM | POA: Diagnosis not present

## 2021-08-09 DIAGNOSIS — G309 Alzheimer's disease, unspecified: Secondary | ICD-10-CM | POA: Diagnosis not present

## 2021-08-16 DIAGNOSIS — I69354 Hemiplegia and hemiparesis following cerebral infarction affecting left non-dominant side: Secondary | ICD-10-CM | POA: Diagnosis not present

## 2021-08-16 DIAGNOSIS — G4700 Insomnia, unspecified: Secondary | ICD-10-CM | POA: Diagnosis not present

## 2021-08-16 DIAGNOSIS — K219 Gastro-esophageal reflux disease without esophagitis: Secondary | ICD-10-CM | POA: Diagnosis not present

## 2021-08-16 DIAGNOSIS — G40909 Epilepsy, unspecified, not intractable, without status epilepticus: Secondary | ICD-10-CM | POA: Diagnosis not present

## 2021-08-16 DIAGNOSIS — Z87891 Personal history of nicotine dependence: Secondary | ICD-10-CM | POA: Diagnosis not present

## 2021-08-16 DIAGNOSIS — I10 Essential (primary) hypertension: Secondary | ICD-10-CM | POA: Diagnosis not present

## 2021-08-16 DIAGNOSIS — I4821 Permanent atrial fibrillation: Secondary | ICD-10-CM | POA: Diagnosis not present

## 2021-08-16 DIAGNOSIS — G309 Alzheimer's disease, unspecified: Secondary | ICD-10-CM | POA: Diagnosis not present

## 2021-08-16 DIAGNOSIS — F0150 Vascular dementia without behavioral disturbance: Secondary | ICD-10-CM | POA: Diagnosis not present

## 2021-08-20 DIAGNOSIS — K219 Gastro-esophageal reflux disease without esophagitis: Secondary | ICD-10-CM | POA: Diagnosis not present

## 2021-08-20 DIAGNOSIS — F0150 Vascular dementia without behavioral disturbance: Secondary | ICD-10-CM | POA: Diagnosis not present

## 2021-08-20 DIAGNOSIS — I69354 Hemiplegia and hemiparesis following cerebral infarction affecting left non-dominant side: Secondary | ICD-10-CM | POA: Diagnosis not present

## 2021-08-20 DIAGNOSIS — G309 Alzheimer's disease, unspecified: Secondary | ICD-10-CM | POA: Diagnosis not present

## 2021-08-20 DIAGNOSIS — I4821 Permanent atrial fibrillation: Secondary | ICD-10-CM | POA: Diagnosis not present

## 2021-08-20 DIAGNOSIS — G40909 Epilepsy, unspecified, not intractable, without status epilepticus: Secondary | ICD-10-CM | POA: Diagnosis not present

## 2021-08-20 DIAGNOSIS — I10 Essential (primary) hypertension: Secondary | ICD-10-CM | POA: Diagnosis not present

## 2021-08-30 DIAGNOSIS — Z87891 Personal history of nicotine dependence: Secondary | ICD-10-CM | POA: Diagnosis not present

## 2021-08-30 DIAGNOSIS — I69354 Hemiplegia and hemiparesis following cerebral infarction affecting left non-dominant side: Secondary | ICD-10-CM | POA: Diagnosis not present

## 2021-08-30 DIAGNOSIS — G309 Alzheimer's disease, unspecified: Secondary | ICD-10-CM | POA: Diagnosis not present

## 2021-08-30 DIAGNOSIS — G4700 Insomnia, unspecified: Secondary | ICD-10-CM | POA: Diagnosis not present

## 2021-08-30 DIAGNOSIS — G40909 Epilepsy, unspecified, not intractable, without status epilepticus: Secondary | ICD-10-CM | POA: Diagnosis not present

## 2021-08-30 DIAGNOSIS — F0150 Vascular dementia without behavioral disturbance: Secondary | ICD-10-CM | POA: Diagnosis not present

## 2021-08-30 DIAGNOSIS — I10 Essential (primary) hypertension: Secondary | ICD-10-CM | POA: Diagnosis not present

## 2021-08-30 DIAGNOSIS — K219 Gastro-esophageal reflux disease without esophagitis: Secondary | ICD-10-CM | POA: Diagnosis not present

## 2021-08-30 DIAGNOSIS — I4821 Permanent atrial fibrillation: Secondary | ICD-10-CM | POA: Diagnosis not present

## 2021-09-06 DIAGNOSIS — I4821 Permanent atrial fibrillation: Secondary | ICD-10-CM | POA: Diagnosis not present

## 2021-09-06 DIAGNOSIS — I10 Essential (primary) hypertension: Secondary | ICD-10-CM | POA: Diagnosis not present

## 2021-09-06 DIAGNOSIS — Z87891 Personal history of nicotine dependence: Secondary | ICD-10-CM | POA: Diagnosis not present

## 2021-09-06 DIAGNOSIS — G4700 Insomnia, unspecified: Secondary | ICD-10-CM | POA: Diagnosis not present

## 2021-09-06 DIAGNOSIS — F0150 Vascular dementia without behavioral disturbance: Secondary | ICD-10-CM | POA: Diagnosis not present

## 2021-09-06 DIAGNOSIS — G309 Alzheimer's disease, unspecified: Secondary | ICD-10-CM | POA: Diagnosis not present

## 2021-09-06 DIAGNOSIS — G40909 Epilepsy, unspecified, not intractable, without status epilepticus: Secondary | ICD-10-CM | POA: Diagnosis not present

## 2021-09-06 DIAGNOSIS — I69354 Hemiplegia and hemiparesis following cerebral infarction affecting left non-dominant side: Secondary | ICD-10-CM | POA: Diagnosis not present

## 2021-09-06 DIAGNOSIS — K219 Gastro-esophageal reflux disease without esophagitis: Secondary | ICD-10-CM | POA: Diagnosis not present

## 2021-09-12 DIAGNOSIS — Z862 Personal history of diseases of the blood and blood-forming organs and certain disorders involving the immune mechanism: Secondary | ICD-10-CM | POA: Diagnosis not present

## 2021-09-12 DIAGNOSIS — E7800 Pure hypercholesterolemia, unspecified: Secondary | ICD-10-CM | POA: Diagnosis not present

## 2021-09-12 DIAGNOSIS — I10 Essential (primary) hypertension: Secondary | ICD-10-CM | POA: Diagnosis not present

## 2021-09-14 DIAGNOSIS — G309 Alzheimer's disease, unspecified: Secondary | ICD-10-CM | POA: Diagnosis not present

## 2021-09-14 DIAGNOSIS — K219 Gastro-esophageal reflux disease without esophagitis: Secondary | ICD-10-CM | POA: Diagnosis not present

## 2021-09-14 DIAGNOSIS — I4821 Permanent atrial fibrillation: Secondary | ICD-10-CM | POA: Diagnosis not present

## 2021-09-14 DIAGNOSIS — F0150 Vascular dementia without behavioral disturbance: Secondary | ICD-10-CM | POA: Diagnosis not present

## 2021-09-14 DIAGNOSIS — I10 Essential (primary) hypertension: Secondary | ICD-10-CM | POA: Diagnosis not present

## 2021-09-14 DIAGNOSIS — G4700 Insomnia, unspecified: Secondary | ICD-10-CM | POA: Diagnosis not present

## 2021-09-14 DIAGNOSIS — I69354 Hemiplegia and hemiparesis following cerebral infarction affecting left non-dominant side: Secondary | ICD-10-CM | POA: Diagnosis not present

## 2021-09-14 DIAGNOSIS — G40909 Epilepsy, unspecified, not intractable, without status epilepticus: Secondary | ICD-10-CM | POA: Diagnosis not present

## 2021-09-14 DIAGNOSIS — Z87891 Personal history of nicotine dependence: Secondary | ICD-10-CM | POA: Diagnosis not present

## 2021-09-19 DIAGNOSIS — F0390 Unspecified dementia without behavioral disturbance: Secondary | ICD-10-CM | POA: Diagnosis not present

## 2021-09-19 DIAGNOSIS — Z0000 Encounter for general adult medical examination without abnormal findings: Secondary | ICD-10-CM | POA: Diagnosis not present

## 2021-09-19 DIAGNOSIS — Z862 Personal history of diseases of the blood and blood-forming organs and certain disorders involving the immune mechanism: Secondary | ICD-10-CM | POA: Diagnosis not present

## 2021-09-19 DIAGNOSIS — F17210 Nicotine dependence, cigarettes, uncomplicated: Secondary | ICD-10-CM | POA: Diagnosis not present

## 2021-09-19 DIAGNOSIS — Z1389 Encounter for screening for other disorder: Secondary | ICD-10-CM | POA: Diagnosis not present

## 2021-09-19 DIAGNOSIS — Z23 Encounter for immunization: Secondary | ICD-10-CM | POA: Diagnosis not present

## 2021-09-19 DIAGNOSIS — N289 Disorder of kidney and ureter, unspecified: Secondary | ICD-10-CM | POA: Diagnosis not present

## 2021-10-02 DIAGNOSIS — G309 Alzheimer's disease, unspecified: Secondary | ICD-10-CM | POA: Diagnosis not present

## 2021-10-02 DIAGNOSIS — I69354 Hemiplegia and hemiparesis following cerebral infarction affecting left non-dominant side: Secondary | ICD-10-CM | POA: Diagnosis not present

## 2021-10-02 DIAGNOSIS — I4821 Permanent atrial fibrillation: Secondary | ICD-10-CM | POA: Diagnosis not present

## 2021-10-02 DIAGNOSIS — I10 Essential (primary) hypertension: Secondary | ICD-10-CM | POA: Diagnosis not present

## 2021-10-02 DIAGNOSIS — G4700 Insomnia, unspecified: Secondary | ICD-10-CM | POA: Diagnosis not present

## 2021-10-02 DIAGNOSIS — F0150 Vascular dementia without behavioral disturbance: Secondary | ICD-10-CM | POA: Diagnosis not present

## 2021-10-02 DIAGNOSIS — Z87891 Personal history of nicotine dependence: Secondary | ICD-10-CM | POA: Diagnosis not present

## 2021-10-02 DIAGNOSIS — G40909 Epilepsy, unspecified, not intractable, without status epilepticus: Secondary | ICD-10-CM | POA: Diagnosis not present

## 2021-10-02 DIAGNOSIS — K219 Gastro-esophageal reflux disease without esophagitis: Secondary | ICD-10-CM | POA: Diagnosis not present

## 2021-10-15 ENCOUNTER — Other Ambulatory Visit: Payer: Self-pay

## 2021-10-15 ENCOUNTER — Emergency Department
Admission: EM | Admit: 2021-10-15 | Discharge: 2021-10-16 | Disposition: A | Discharge status: Home / Self Care | Payer: Medicare HMO | Attending: Emergency Medicine | Admitting: Emergency Medicine

## 2021-10-15 ENCOUNTER — Emergency Department: Payer: Medicare HMO

## 2021-10-15 DIAGNOSIS — F17200 Nicotine dependence, unspecified, uncomplicated: Secondary | ICD-10-CM | POA: Insufficient documentation

## 2021-10-15 DIAGNOSIS — Z79899 Other long term (current) drug therapy: Secondary | ICD-10-CM | POA: Insufficient documentation

## 2021-10-15 DIAGNOSIS — I6381 Other cerebral infarction due to occlusion or stenosis of small artery: Secondary | ICD-10-CM | POA: Diagnosis not present

## 2021-10-15 DIAGNOSIS — R519 Headache, unspecified: Secondary | ICD-10-CM | POA: Diagnosis not present

## 2021-10-15 DIAGNOSIS — N390 Urinary tract infection, site not specified: Secondary | ICD-10-CM | POA: Insufficient documentation

## 2021-10-15 DIAGNOSIS — Z7901 Long term (current) use of anticoagulants: Secondary | ICD-10-CM | POA: Insufficient documentation

## 2021-10-15 DIAGNOSIS — E876 Hypokalemia: Secondary | ICD-10-CM | POA: Insufficient documentation

## 2021-10-15 DIAGNOSIS — R82998 Other abnormal findings in urine: Secondary | ICD-10-CM | POA: Diagnosis present

## 2021-10-15 DIAGNOSIS — R479 Unspecified speech disturbances: Secondary | ICD-10-CM | POA: Insufficient documentation

## 2021-10-15 DIAGNOSIS — R9431 Abnormal electrocardiogram [ECG] [EKG]: Secondary | ICD-10-CM | POA: Diagnosis not present

## 2021-10-15 DIAGNOSIS — I480 Paroxysmal atrial fibrillation: Secondary | ICD-10-CM | POA: Diagnosis not present

## 2021-10-15 DIAGNOSIS — I10 Essential (primary) hypertension: Secondary | ICD-10-CM | POA: Diagnosis not present

## 2021-10-15 DIAGNOSIS — Z20822 Contact with and (suspected) exposure to covid-19: Secondary | ICD-10-CM | POA: Diagnosis not present

## 2021-10-15 DIAGNOSIS — R4182 Altered mental status, unspecified: Secondary | ICD-10-CM | POA: Diagnosis not present

## 2021-10-15 DIAGNOSIS — G309 Alzheimer's disease, unspecified: Secondary | ICD-10-CM | POA: Diagnosis not present

## 2021-10-15 HISTORY — DX: Cerebral infarction, unspecified: I63.9

## 2021-10-15 HISTORY — DX: Unspecified convulsions: R56.9

## 2021-10-15 LAB — CBC
HCT: 41.3 % (ref 39.0–52.0)
Hemoglobin: 14.2 g/dL (ref 13.0–17.0)
MCH: 33.3 pg (ref 26.0–34.0)
MCHC: 34.4 g/dL (ref 30.0–36.0)
MCV: 96.7 fL (ref 80.0–100.0)
Platelets: 204 10*3/uL (ref 150–400)
RBC: 4.27 MIL/uL (ref 4.22–5.81)
RDW: 16.6 % — ABNORMAL HIGH (ref 11.5–15.5)
WBC: 7.7 10*3/uL (ref 4.0–10.5)
nRBC: 0 % (ref 0.0–0.2)

## 2021-10-15 LAB — COMPREHENSIVE METABOLIC PANEL
ALT: 20 U/L (ref 0–44)
AST: 35 U/L (ref 15–41)
Albumin: 3.9 g/dL (ref 3.5–5.0)
Alkaline Phosphatase: 64 U/L (ref 38–126)
Anion gap: 11 (ref 5–15)
BUN: 21 mg/dL (ref 8–23)
CO2: 24 mmol/L (ref 22–32)
Calcium: 9.5 mg/dL (ref 8.9–10.3)
Chloride: 99 mmol/L (ref 98–111)
Creatinine, Ser: 1.09 mg/dL (ref 0.61–1.24)
GFR, Estimated: >60 mL/min (ref >60)
Glucose, Bld: 93 mg/dL (ref 70–99)
Potassium: 2.9 mmol/L — ABNORMAL LOW (ref 3.5–5.1)
Sodium: 134 mmol/L — ABNORMAL LOW (ref 135–145)
Total Bilirubin: 0.9 mg/dL (ref 0.3–1.2)
Total Protein: 7.3 g/dL (ref 6.5–8.1)

## 2021-10-15 NOTE — ED Notes (Signed)
Caregiver at bedside reports pt last known well at 0700 today

## 2021-10-15 NOTE — ED Triage Notes (Signed)
Pt to ED for headache since this am, states also has HTN and took meds this am. NAD noted  Family reports pt is not acting self today

## 2021-10-15 NOTE — ED Provider Notes (Signed)
Emergency Medicine Provider Triage Evaluation Note  Richard Carter , a 80 y.o. male  was evaluated in triage.  Pt complains of headache, alteration from baseline.  Patient presents with friend who states that patient was last known well at 7 AM, 11-1/2 hours ago.  Patient has been complaining of a right-sided headache.  The friend states he is not acting quite his normal self, seems more confused than normal.  No reported trauma.  Patient denies any URI symptoms, chest pain, shortness of breath, abdominal pain.  Review of Systems  Positive: Headache, altered mental status Negative: Fevers, URI symptoms, chest pain, shortness of breath, abdominal complaints  Physical Exam  BP (!) 149/94   Pulse (!) 101   Temp (!) 97.5 F (36.4 C) (Oral)   Resp 18   Ht 5\' 5"  (1.651 m)   Wt 63.5 kg   SpO2 96%   BMI 23.30 kg/m  Gen:   Awake, no distress   Resp:  Normal effort  MSK:   Moves extremities without difficulty  Other:  With what appears to be some mild left-sided facial droop, however is able to complete cranial nerve testing.  Patient has an issue with his right arm at baseline, however with the extension patient is able to perform with his right arm there is no pronator drift.  Equal grip strength upper extremities.  Medical Decision Making  Medically screening exam initiated at 6:16 PM.  Appropriate orders placed.  was informed that the remainder of the evaluation will be completed by another provider, this initial triage assessment does not replace that evaluation, and the importance of remaining in the ED until their evaluation is complete.  Patient presents with headache, altered mental status since 7 AM this morning.  Outside the window at this time.  Patient will have labs, imaging, but will not call code stroke at this time.   [name] 10/15/21 1823    10/17/21, MD 10/15/21 [phone]

## 2021-10-16 ENCOUNTER — Emergency Department: Payer: Medicare HMO

## 2021-10-16 DIAGNOSIS — I6381 Other cerebral infarction due to occlusion or stenosis of small artery: Secondary | ICD-10-CM | POA: Diagnosis not present

## 2021-10-16 LAB — URINALYSIS, COMPLETE (UACMP) WITH MICROSCOPIC
Bilirubin Urine: NEGATIVE
Glucose, UA: NEGATIVE mg/dL
Ketones, ur: NEGATIVE mg/dL
Nitrite: NEGATIVE
Protein, ur: 100 mg/dL — AB
Specific Gravity, Urine: 1.015 (ref 1.005–1.030)
WBC, UA: >50 WBC/hpf — ABNORMAL HIGH (ref 0–5)
pH: 5 (ref 5.0–8.0)

## 2021-10-16 LAB — RESP PANEL BY RT-PCR (FLU A&B, COVID) ARPGX2
Influenza A by PCR: NEGATIVE
Influenza B by PCR: NEGATIVE
SARS Coronavirus 2 by RT PCR: NEGATIVE

## 2021-10-16 MED ORDER — CEPHALEXIN 500 MG PO CAPS: 500.0000 mg | ORAL_CAPSULE | Freq: Four times a day (QID) | ORAL | 0 refills | Status: AC

## 2021-10-16 MED ORDER — CEPHALEXIN 500 MG PO CAPS
500.0000 mg | ORAL_CAPSULE | Freq: Once | ORAL | Status: AC
  Administered 2021-10-16: 500 mg via ORAL
  Filled 2021-10-16: qty 1

## 2021-10-16 MED ORDER — POTASSIUM CHLORIDE CRYS ER 20 MEQ PO TBCR: 20.0000 meq | EXTENDED_RELEASE_TABLET | Freq: Two times a day (BID) | ORAL | 0 refills | Status: DC

## 2021-10-16 MED ORDER — POTASSIUM CHLORIDE 20 MEQ PO PACK
60.0000 meq | PACK | Freq: Once | ORAL | Status: AC
  Administered 2021-10-16: 60 meq via ORAL
  Filled 2021-10-16: qty 3

## 2021-10-16 NOTE — Discharge Instructions (Addendum)
You have a urinary tract infection.  Take the antibiotic 4 times a day for the next 7 days.  Your potassium was also mildly low.  Please take the potassium supplementation twice a day for the next 3 days.  Please have a repeat basic metabolic panel in 1 week to reassess your potassium level.  The rest of your blood work was reassuring.  Your MRI did not show any new stroke.  Please continue to take the Eliquis as prescribed.

## 2021-10-16 NOTE — ED Provider Notes (Signed)
[facility]  ____________________________________________   Event Date/Time   First MD Initiated Contact with Patient 10/15/21 2352     (approximate)  I have reviewed the triage vital signs and the nursing notes.   HISTORY  Chief Complaint Headache    HPI Richard Carter is a 80 y.o. male paroxysmal atrial fibrillation on Eliquis, vascular and Alzheimer's dementia, prior CVAs who presents with headache and difficulty with speech.  Patient is accompanied by his neighbor who is also his care provider.     Patient's care provider notes that his blood pressure was significantly elevated today in the 190s.  Patient also complained of a right-sided headache.  Around 4 PM patient's caregiver noticed that he was having difficulty speaking, but he did not notice any facial droop or focal weakness.  Symptoms resolved in the emergency department.  Patient currently denies any headache.  He denies any difficulty with speech numbness weakness lightheadedness etc.  He denies any nausea vomiting chest pain or abdominal pain.  Patient's caretaker notes that he has not been eating very much and had a very strong smelling odor to the urine today.  Past Medical History:  Diagnosis Date   Alcohol abuse    Dementia ([place])    GERD (gastroesophageal reflux disease)    HLD (hyperlipidemia)    HTN (hypertension)    Seizures (HCC)    Stroke [facility])     Patient Active Problem List   Diagnosis Date Noted   Acute CVA (cerebrovascular accident) ([place]) 05/07/2021   Non compliance w medication regimen 05/07/2021   Dementia ([place]) 05/07/2021   AF (paroxysmal atrial fibrillation) ([place]) 05/07/2021   HTN (hypertension) 11/29/2020   Alcohol abuse 11/29/2020   AKI (acute kidney injury) ([place]) 11/29/2020   Atrial flutter with rapid ventricular response ([place]) 11/29/2020   Seizure ([place]) 02/22/2020   Pubic ramus fracture ([place]) 07/29/2019   Acetabular fracture ([place]) 07/29/2019   HLD  (hyperlipidemia) 07/29/2019   GERD (gastroesophageal reflux disease) 07/29/2019   UTI (urinary tract infection) 07/29/2019    Past Surgical History:  Procedure Laterality Date   skin cyst removal      Prior to Admission medications   Medication Sig Start Date End Date Taking? Authorizing Provider  cephALEXin (KEFLEX) 500 MG capsule Take 1 capsule (500 mg total) by mouth 4 (four) times daily for 7 days. 10/16/21 10/23/21 Yes Rada Hay, MD  potassium chloride SA (KLOR-CON) 20 MEQ tablet Take 1 tablet (20 mEq total) by mouth 2 (two) times daily for 3 days. 10/16/21 10/19/21 Yes Rada Hay, MD  amLODipine (NORVASC) 5 MG tablet Take 1 tablet (5 mg total) by mouth daily. 05/12/21   Sreenath, Trula Slade, MD  apixaban (ELIQUIS) 5 MG TABS tablet Take 1 tablet (5 mg total) by mouth 2 (two) times daily. 05/11/21   Sidney Ace, MD  atorvastatin (LIPITOR) 20 MG tablet Take 20 mg by mouth daily.    [provider]  levETIRAcetam (KEPPRA) 500 MG tablet Take 1 tablet (500 mg total) by mouth 2 (two) times daily. 12/01/20   Danford, Suann Amar, MD  losartan (COZAAR) 25 MG tablet Take 25 mg by mouth daily.    [provider]  magnesium oxide (MAG-OX) 400 MG tablet Take 400 mg by mouth daily.    [provider]  metoprolol succinate (TOPROL-XL) 50 MG 24 hr tablet Take 1 tablet (50 mg total) by mouth daily. 12/01/20   Danford, Suann Jamarien, MD  nicotine (NICODERM CQ -  DOSED IN MG/24 HOURS) 14 mg/24hr patch Place 1 patch (14 mg total) onto the skin daily. 05/12/21   Sidney Ace, MD  omeprazole (PRILOSEC) 40 MG capsule Take 40 mg by mouth daily.    [provider]  traZODone (DESYREL) 100 MG tablet Take 1 tablet (100 mg total) by mouth at bedtime. 05/11/21   Sidney Ace, MD    Allergies Patient has no known allergies.  Family History  Problem Relation Age of Onset   Stroke Mother    Cancer Brother     Social History Social History    Tobacco Use   Smoking status: Every Day   Smokeless tobacco: Never  Vaping Use   Vaping Use: Never used  Substance Use Topics   Alcohol use: Yes    Review of Systems   Review of Systems  Constitutional:  Negative for chills and fever.  Respiratory:  Negative for shortness of breath.   Cardiovascular:  Negative for chest pain.  Gastrointestinal:  Negative for abdominal pain, nausea and vomiting.  Genitourinary:  Negative for dysuria.  Neurological:  Positive for speech difficulty and headaches. Negative for weakness and numbness.  All other systems reviewed and are negative.  Physical Exam Updated Vital Signs BP (!) 159/96   Pulse 72   Temp (!) 97.5 F (36.4 C) (Oral)   Resp 17   Ht 5\' 5"  (1.651 m)   Wt 63.5 kg   SpO2 96%   BMI 23.30 kg/m   Physical Exam Vitals and nursing note reviewed.  Constitutional:      General: He is not in acute distress.    Appearance: Normal appearance.     Comments: Patient is chronically ill-appearing  HENT:     Head: Normocephalic and atraumatic.  Eyes:     General: No scleral icterus.    Conjunctiva/sclera: Conjunctivae normal.  Pulmonary:     Effort: Pulmonary effort is normal. No respiratory distress.     Breath sounds: Normal breath sounds. No wheezing.  Abdominal:     General: There is no distension.     Palpations: Abdomen is soft.     Tenderness: There is no abdominal tenderness.  Musculoskeletal:        General: No deformity or signs of injury.     Cervical back: Normal range of motion.  Skin:    Coloration: Skin is not jaundiced or pale.  Neurological:     General: No focal deficit present.     Mental Status: He is alert and oriented to person, place, and time. Mental status is at baseline.     Comments: Aox3, nml speech  PERRL, EOMI, face symmetric, nml tongue movement  5/5 strength in the BL upper and lower extremities  Sensation grossly intact in the BL upper and lower extremities  Finger-nose-finger intact  BL   Psychiatric:        Mood and Affect: Mood normal.        Behavior: Behavior normal.     LABS (all labs ordered are listed, but only abnormal results are displayed)  Labs Reviewed  COMPREHENSIVE METABOLIC PANEL - Abnormal; Notable for the following components:      Result Value   Sodium 134 (*)    Potassium 2.9 (*)    All other components within normal limits  CBC - Abnormal; Notable for the following components:   RDW 16.6 (*)    All other components within normal limits  URINALYSIS, COMPLETE (UACMP) WITH MICROSCOPIC - Abnormal; Notable for the  following components:   Color, Urine AMBER (*)    APPearance CLOUDY (*)    Hgb urine dipstick SMALL (*)    Protein, ur 100 (*)    Leukocytes,Ua LARGE (*)    WBC, UA >50 (*)    Bacteria, UA MANY (*)    Non Squamous Epithelial PRESENT (*)    All other components within normal limits  RESP PANEL BY RT-PCR (FLU A&B, COVID) ARPGX2   ____________________________________________  EKG  Atrial fibrillation with controlled ventricular rate, left axis deviation, no acute ischemic changes ____________________________________________  RADIOLOGY Almeta Monas, personally viewed and evaluated these images (plain radiographs) as part of my medical decision making, as well as reviewing the written report by the radiologist.  ED MD interpretation:  I reviewed the CT scan of the brain which does not show any acute intracranial process      ____________________________________________   PROCEDURES  Procedure(s) performed (including Critical Care):  Procedures   ____________________________________________   INITIAL IMPRESSION / ASSESSMENT AND PLAN / ED COURSE   80 year old male with history of multiple CVAs in the past presents with headache and difficulty with speech.  Patient's caregiver provides most of the history as the patient really has no complaints at this time and while he does recall headache does not recall any  difficulty with speech.  Somewhat difficult to determine exactly what the onset was but was sometime around 4 PM and then resolved while he was in the waiting room.  Of note no speech difficulty was noted during triage exam.  My evaluation the patient does not have any acute neurologic deficits he is not aphasic or dysarthric has equal strength in the bilateral upper and lower extremities without obvious facial droop.  Will obtain an MRI to rule out another acute infarct.  Patient's strokes have been deemed to his history of A. fib and being previously off anticoagulation.  He is currently on Eliquis so is overall optimized.  If MRI does not show any acute infarct can likely be discharged with ongoing anticoagulation.  We will also obtain a UA and COVID and flu swabs given he may have had recrudescence in the setting of illness.  Patient's MRI does not show any new acute findings.  His UA is notable for significant WBCs and bacteria consistent with UTI.  Together with his caretakers note that the urine was cloudy and foul-smelling, will treat for complicated UTI with Keflex.  This could explain his dysarthria today.  He is otherwise not showing signs of sepsis, heart rate has normalized he has no leukocytosis.  His potassium is notably mildly elevated at 2.9.  He was given p.o. supplementation in the ED and will discharge with 3-day supplementation.  Advised to get a repeat BMP in about 1 week.     ____________________________________________   FINAL CLINICAL IMPRESSION(S) / ED DIAGNOSES  Final diagnoses:  Urinary tract infection without hematuria, site unspecified  Hypokalemia     ED Discharge Orders          Ordered    cephALEXin (KEFLEX) 500 MG capsule  4 times daily        10/16/21 0346    potassium chloride SA (KLOR-CON) 20 MEQ tablet  2 times daily        10/16/21 0346             Note:  This document was prepared using Dragon voice recognition software and may include  unintentional dictation errors.    Rada Hay,  MD 10/16/21 [phone]

## 2021-11-30 DIAGNOSIS — F1090 Alcohol use, unspecified, uncomplicated: Secondary | ICD-10-CM | POA: Diagnosis not present

## 2021-11-30 DIAGNOSIS — G309 Alzheimer's disease, unspecified: Secondary | ICD-10-CM | POA: Diagnosis not present

## 2021-11-30 DIAGNOSIS — R569 Unspecified convulsions: Secondary | ICD-10-CM | POA: Diagnosis not present

## 2021-11-30 DIAGNOSIS — F0150 Vascular dementia without behavioral disturbance: Secondary | ICD-10-CM | POA: Diagnosis not present

## 2021-11-30 DIAGNOSIS — F0280 Dementia in other diseases classified elsewhere without behavioral disturbance: Secondary | ICD-10-CM | POA: Diagnosis not present

## 2021-11-30 DIAGNOSIS — I10 Essential (primary) hypertension: Secondary | ICD-10-CM | POA: Diagnosis not present

## 2021-11-30 DIAGNOSIS — Z8673 Personal history of transient ischemic attack (TIA), and cerebral infarction without residual deficits: Secondary | ICD-10-CM | POA: Diagnosis not present

## 2021-11-30 DIAGNOSIS — F17210 Nicotine dependence, cigarettes, uncomplicated: Secondary | ICD-10-CM | POA: Diagnosis not present

## 2022-01-16 DIAGNOSIS — Z862 Personal history of diseases of the blood and blood-forming organs and certain disorders involving the immune mechanism: Secondary | ICD-10-CM | POA: Diagnosis not present

## 2022-01-16 DIAGNOSIS — E7800 Pure hypercholesterolemia, unspecified: Secondary | ICD-10-CM | POA: Diagnosis not present

## 2022-01-16 DIAGNOSIS — I10 Essential (primary) hypertension: Secondary | ICD-10-CM | POA: Diagnosis not present

## 2022-01-21 DIAGNOSIS — I10 Essential (primary) hypertension: Secondary | ICD-10-CM | POA: Diagnosis not present

## 2022-01-21 DIAGNOSIS — I480 Paroxysmal atrial fibrillation: Secondary | ICD-10-CM | POA: Diagnosis not present

## 2022-01-21 DIAGNOSIS — Z8669 Personal history of other diseases of the nervous system and sense organs: Secondary | ICD-10-CM | POA: Diagnosis not present

## 2022-01-21 DIAGNOSIS — E7800 Pure hypercholesterolemia, unspecified: Secondary | ICD-10-CM | POA: Diagnosis not present

## 2022-01-21 DIAGNOSIS — F17210 Nicotine dependence, cigarettes, uncomplicated: Secondary | ICD-10-CM | POA: Diagnosis not present

## 2022-01-21 DIAGNOSIS — Z87898 Personal history of other specified conditions: Secondary | ICD-10-CM | POA: Diagnosis not present

## 2022-01-21 DIAGNOSIS — F5104 Psychophysiologic insomnia: Secondary | ICD-10-CM | POA: Diagnosis not present

## 2022-01-28 DIAGNOSIS — F0150 Vascular dementia without behavioral disturbance: Secondary | ICD-10-CM | POA: Diagnosis not present

## 2022-01-28 DIAGNOSIS — Z8673 Personal history of transient ischemic attack (TIA), and cerebral infarction without residual deficits: Secondary | ICD-10-CM | POA: Diagnosis not present

## 2022-01-28 DIAGNOSIS — F17200 Nicotine dependence, unspecified, uncomplicated: Secondary | ICD-10-CM | POA: Diagnosis not present

## 2022-01-28 DIAGNOSIS — I10 Essential (primary) hypertension: Secondary | ICD-10-CM | POA: Diagnosis not present

## 2022-01-28 DIAGNOSIS — I480 Paroxysmal atrial fibrillation: Secondary | ICD-10-CM | POA: Diagnosis not present

## 2022-01-28 DIAGNOSIS — R569 Unspecified convulsions: Secondary | ICD-10-CM | POA: Diagnosis not present

## 2022-01-28 DIAGNOSIS — E7800 Pure hypercholesterolemia, unspecified: Secondary | ICD-10-CM | POA: Diagnosis not present

## 2022-01-28 DIAGNOSIS — I639 Cerebral infarction, unspecified: Secondary | ICD-10-CM | POA: Diagnosis not present

## 2022-01-28 DIAGNOSIS — G309 Alzheimer's disease, unspecified: Secondary | ICD-10-CM | POA: Diagnosis not present

## 2022-02-12 DIAGNOSIS — G44201 Tension-type headache, unspecified, intractable: Secondary | ICD-10-CM | POA: Diagnosis not present

## 2022-02-25 DIAGNOSIS — R0781 Pleurodynia: Secondary | ICD-10-CM | POA: Diagnosis not present

## 2022-02-25 DIAGNOSIS — R569 Unspecified convulsions: Secondary | ICD-10-CM | POA: Diagnosis not present

## 2022-02-25 DIAGNOSIS — I480 Paroxysmal atrial fibrillation: Secondary | ICD-10-CM | POA: Diagnosis not present

## 2022-02-25 DIAGNOSIS — M4319 Spondylolisthesis, multiple sites in spine: Secondary | ICD-10-CM | POA: Diagnosis not present

## 2022-02-25 DIAGNOSIS — I6522 Occlusion and stenosis of left carotid artery: Secondary | ICD-10-CM | POA: Diagnosis not present

## 2022-02-25 DIAGNOSIS — M25551 Pain in right hip: Secondary | ICD-10-CM | POA: Diagnosis not present

## 2022-02-25 DIAGNOSIS — S2241XA Multiple fractures of ribs, right side, initial encounter for closed fracture: Secondary | ICD-10-CM | POA: Diagnosis not present

## 2022-02-25 DIAGNOSIS — M4802 Spinal stenosis, cervical region: Secondary | ICD-10-CM | POA: Diagnosis not present

## 2022-02-25 DIAGNOSIS — G8929 Other chronic pain: Secondary | ICD-10-CM | POA: Diagnosis not present

## 2022-02-25 DIAGNOSIS — G44201 Tension-type headache, unspecified, intractable: Secondary | ICD-10-CM | POA: Diagnosis not present

## 2022-02-25 DIAGNOSIS — I10 Essential (primary) hypertension: Secondary | ICD-10-CM | POA: Diagnosis not present

## 2022-04-20 ENCOUNTER — Emergency Department: Payer: Medicare HMO

## 2022-04-20 ENCOUNTER — Other Ambulatory Visit: Payer: Self-pay

## 2022-04-20 ENCOUNTER — Inpatient Hospital Stay
Admission: EM | Admit: 2022-04-20 | Discharge: 2022-04-29 | DRG: 064 | Disposition: A | Discharge status: Skilled Nursing Facility | Payer: Medicare HMO | Attending: Internal Medicine | Admitting: Internal Medicine

## 2022-04-20 ENCOUNTER — Inpatient Hospital Stay: Payer: Medicare HMO

## 2022-04-20 DIAGNOSIS — Z79899 Other long term (current) drug therapy: Secondary | ICD-10-CM

## 2022-04-20 DIAGNOSIS — I63132 Cerebral infarction due to embolism of left carotid artery: Principal | ICD-10-CM | POA: Diagnosis present

## 2022-04-20 DIAGNOSIS — R4701 Aphasia: Principal | ICD-10-CM

## 2022-04-20 DIAGNOSIS — Z7189 Other specified counseling: Secondary | ICD-10-CM | POA: Diagnosis not present

## 2022-04-20 DIAGNOSIS — R471 Dysarthria and anarthria: Secondary | ICD-10-CM | POA: Diagnosis present

## 2022-04-20 DIAGNOSIS — Z7901 Long term (current) use of anticoagulants: Secondary | ICD-10-CM

## 2022-04-20 DIAGNOSIS — K219 Gastro-esophageal reflux disease without esophagitis: Secondary | ICD-10-CM | POA: Diagnosis present

## 2022-04-20 DIAGNOSIS — E785 Hyperlipidemia, unspecified: Secondary | ICD-10-CM | POA: Diagnosis not present

## 2022-04-20 DIAGNOSIS — R1310 Dysphagia, unspecified: Secondary | ICD-10-CM | POA: Diagnosis present

## 2022-04-20 DIAGNOSIS — R5381 Other malaise: Secondary | ICD-10-CM | POA: Diagnosis not present

## 2022-04-20 DIAGNOSIS — K920 Hematemesis: Secondary | ICD-10-CM | POA: Diagnosis not present

## 2022-04-20 DIAGNOSIS — J439 Emphysema, unspecified: Secondary | ICD-10-CM | POA: Diagnosis present

## 2022-04-20 DIAGNOSIS — I63233 Cerebral infarction due to unspecified occlusion or stenosis of bilateral carotid arteries: Secondary | ICD-10-CM | POA: Diagnosis not present

## 2022-04-20 DIAGNOSIS — J690 Pneumonitis due to inhalation of food and vomit: Secondary | ICD-10-CM | POA: Diagnosis not present

## 2022-04-20 DIAGNOSIS — F0150 Vascular dementia without behavioral disturbance: Secondary | ICD-10-CM | POA: Diagnosis present

## 2022-04-20 DIAGNOSIS — Z91148 Patient's other noncompliance with medication regimen for other reason: Secondary | ICD-10-CM

## 2022-04-20 DIAGNOSIS — Z66 Do not resuscitate: Secondary | ICD-10-CM | POA: Diagnosis not present

## 2022-04-20 DIAGNOSIS — R569 Unspecified convulsions: Secondary | ICD-10-CM

## 2022-04-20 DIAGNOSIS — R001 Bradycardia, unspecified: Secondary | ICD-10-CM | POA: Diagnosis not present

## 2022-04-20 DIAGNOSIS — R0689 Other abnormalities of breathing: Secondary | ICD-10-CM | POA: Diagnosis not present

## 2022-04-20 DIAGNOSIS — R531 Weakness: Secondary | ICD-10-CM | POA: Diagnosis not present

## 2022-04-20 DIAGNOSIS — R778 Other specified abnormalities of plasma proteins: Secondary | ICD-10-CM | POA: Diagnosis present

## 2022-04-20 DIAGNOSIS — N3001 Acute cystitis with hematuria: Secondary | ICD-10-CM | POA: Diagnosis not present

## 2022-04-20 DIAGNOSIS — R0902 Hypoxemia: Secondary | ICD-10-CM | POA: Diagnosis not present

## 2022-04-20 DIAGNOSIS — E876 Hypokalemia: Secondary | ICD-10-CM | POA: Diagnosis present

## 2022-04-20 DIAGNOSIS — G40909 Epilepsy, unspecified, not intractable, without status epilepticus: Secondary | ICD-10-CM | POA: Diagnosis present

## 2022-04-20 DIAGNOSIS — F0390 Unspecified dementia without behavioral disturbance: Secondary | ICD-10-CM | POA: Diagnosis not present

## 2022-04-20 DIAGNOSIS — E860 Dehydration: Secondary | ICD-10-CM | POA: Diagnosis present

## 2022-04-20 DIAGNOSIS — I639 Cerebral infarction, unspecified: Secondary | ICD-10-CM | POA: Diagnosis present

## 2022-04-20 DIAGNOSIS — I10 Essential (primary) hypertension: Secondary | ICD-10-CM | POA: Diagnosis not present

## 2022-04-20 DIAGNOSIS — I69320 Aphasia following cerebral infarction: Secondary | ICD-10-CM

## 2022-04-20 DIAGNOSIS — R29707 NIHSS score 7: Secondary | ICD-10-CM | POA: Diagnosis present

## 2022-04-20 DIAGNOSIS — I69354 Hemiplegia and hemiparesis following cerebral infarction affecting left non-dominant side: Secondary | ICD-10-CM

## 2022-04-20 DIAGNOSIS — N390 Urinary tract infection, site not specified: Secondary | ICD-10-CM | POA: Diagnosis not present

## 2022-04-20 DIAGNOSIS — R7989 Other specified abnormal findings of blood chemistry: Secondary | ICD-10-CM | POA: Diagnosis present

## 2022-04-20 DIAGNOSIS — I6502 Occlusion and stenosis of left vertebral artery: Secondary | ICD-10-CM | POA: Diagnosis not present

## 2022-04-20 DIAGNOSIS — Z515 Encounter for palliative care: Secondary | ICD-10-CM

## 2022-04-20 DIAGNOSIS — F17210 Nicotine dependence, cigarettes, uncomplicated: Secondary | ICD-10-CM | POA: Diagnosis present

## 2022-04-20 DIAGNOSIS — I6523 Occlusion and stenosis of bilateral carotid arteries: Secondary | ICD-10-CM | POA: Diagnosis not present

## 2022-04-20 DIAGNOSIS — I480 Paroxysmal atrial fibrillation: Secondary | ICD-10-CM | POA: Diagnosis not present

## 2022-04-20 DIAGNOSIS — R4182 Altered mental status, unspecified: Secondary | ICD-10-CM | POA: Diagnosis not present

## 2022-04-20 DIAGNOSIS — R29818 Other symptoms and signs involving the nervous system: Secondary | ICD-10-CM | POA: Diagnosis not present

## 2022-04-20 DIAGNOSIS — B9620 Unspecified Escherichia coli [E. coli] as the cause of diseases classified elsewhere: Secondary | ICD-10-CM | POA: Diagnosis present

## 2022-04-20 DIAGNOSIS — I672 Cerebral atherosclerosis: Secondary | ICD-10-CM | POA: Diagnosis not present

## 2022-04-20 DIAGNOSIS — R1110 Vomiting, unspecified: Secondary | ICD-10-CM | POA: Diagnosis not present

## 2022-04-20 DIAGNOSIS — M6281 Muscle weakness (generalized): Secondary | ICD-10-CM | POA: Diagnosis not present

## 2022-04-20 DIAGNOSIS — I69398 Other sequelae of cerebral infarction: Secondary | ICD-10-CM | POA: Diagnosis not present

## 2022-04-20 DIAGNOSIS — Z743 Need for continuous supervision: Secondary | ICD-10-CM | POA: Diagnosis not present

## 2022-04-20 DIAGNOSIS — F17200 Nicotine dependence, unspecified, uncomplicated: Secondary | ICD-10-CM | POA: Diagnosis present

## 2022-04-20 DIAGNOSIS — R2681 Unsteadiness on feet: Secondary | ICD-10-CM | POA: Diagnosis not present

## 2022-04-20 DIAGNOSIS — F1010 Alcohol abuse, uncomplicated: Secondary | ICD-10-CM | POA: Diagnosis present

## 2022-04-20 DIAGNOSIS — K760 Fatty (change of) liver, not elsewhere classified: Secondary | ICD-10-CM | POA: Diagnosis not present

## 2022-04-20 LAB — CBC WITH DIFFERENTIAL/PLATELET
Abs Immature Granulocytes: 0.03 10*3/uL (ref 0.00–0.07)
Basophils Absolute: 0 10*3/uL (ref 0.0–0.1)
Basophils Relative: 1 %
Eosinophils Absolute: 0.2 10*3/uL (ref 0.0–0.5)
Eosinophils Relative: 2 %
HCT: 40.3 % (ref 39.0–52.0)
Hemoglobin: 13.6 g/dL (ref 13.0–17.0)
Immature Granulocytes: 1 %
Lymphocytes Relative: 19 %
Lymphs Abs: 1.3 10*3/uL (ref 0.7–4.0)
MCH: 36.7 pg — ABNORMAL HIGH (ref 26.0–34.0)
MCHC: 33.7 g/dL (ref 30.0–36.0)
MCV: 108.6 fL — ABNORMAL HIGH (ref 80.0–100.0)
Monocytes Absolute: 0.6 10*3/uL (ref 0.1–1.0)
Monocytes Relative: 9 %
Neutro Abs: 4.5 10*3/uL (ref 1.7–7.7)
Neutrophils Relative %: 68 %
Platelets: 198 10*3/uL (ref 150–400)
RBC: 3.71 MIL/uL — ABNORMAL LOW (ref 4.22–5.81)
RDW: 18.2 % — ABNORMAL HIGH (ref 11.5–15.5)
WBC: 6.6 10*3/uL (ref 4.0–10.5)
nRBC: 0 % (ref 0.0–0.2)

## 2022-04-20 LAB — URINALYSIS, ROUTINE W REFLEX MICROSCOPIC
Bilirubin Urine: NEGATIVE
Glucose, UA: NEGATIVE mg/dL
Hgb urine dipstick: NEGATIVE
Ketones, ur: NEGATIVE mg/dL
Nitrite: NEGATIVE
Protein, ur: 30 mg/dL — AB
Specific Gravity, Urine: 1.034 — ABNORMAL HIGH (ref 1.005–1.030)
Squamous Epithelial / HPF: NONE SEEN (ref 0–5)
WBC, UA: >50 WBC/hpf — ABNORMAL HIGH (ref 0–5)
pH: 6 (ref 5.0–8.0)

## 2022-04-20 LAB — COMPREHENSIVE METABOLIC PANEL
ALT: 12 U/L (ref 0–44)
AST: 28 U/L (ref 15–41)
Albumin: 3.4 g/dL — ABNORMAL LOW (ref 3.5–5.0)
Alkaline Phosphatase: 61 U/L (ref 38–126)
Anion gap: 8 (ref 5–15)
BUN: 26 mg/dL — ABNORMAL HIGH (ref 8–23)
CO2: 28 mmol/L (ref 22–32)
Calcium: 8.9 mg/dL (ref 8.9–10.3)
Chloride: 102 mmol/L (ref 98–111)
Creatinine, Ser: 1.33 mg/dL — ABNORMAL HIGH (ref 0.61–1.24)
GFR, Estimated: 53 mL/min — ABNORMAL LOW (ref >60)
Glucose, Bld: 93 mg/dL (ref 70–99)
Potassium: 3.2 mmol/L — ABNORMAL LOW (ref 3.5–5.1)
Sodium: 138 mmol/L (ref 135–145)
Total Bilirubin: 1.6 mg/dL — ABNORMAL HIGH (ref 0.3–1.2)
Total Protein: 6.3 g/dL — ABNORMAL LOW (ref 6.5–8.1)

## 2022-04-20 LAB — HEMOGLOBIN A1C
Hgb A1c MFr Bld: 5.4 % (ref 4.8–5.6)
Mean Plasma Glucose: 108.28 mg/dL

## 2022-04-20 LAB — TROPONIN I (HIGH SENSITIVITY)
Troponin I (High Sensitivity): 64 ng/L — ABNORMAL HIGH (ref <18)
Troponin I (High Sensitivity): 72 ng/L — ABNORMAL HIGH
Troponin I (High Sensitivity): 70 ng/L — ABNORMAL HIGH (ref <18)

## 2022-04-20 LAB — MAGNESIUM: Magnesium: 1 mg/dL — ABNORMAL LOW (ref 1.7–2.4)

## 2022-04-20 MED ORDER — TRAZODONE HCL 50 MG PO TABS
100.0000 mg | ORAL_TABLET | Freq: Every day | ORAL | Status: DC
  Administered 2022-04-20 – 2022-04-28 (×7): 100 mg via ORAL
  Filled 2022-04-20 (×2): qty 2
  Filled 2022-04-20 (×2): qty 1
  Filled 2022-04-20 (×2): qty 2
  Filled 2022-04-20: qty 1

## 2022-04-20 MED ORDER — SODIUM CHLORIDE 0.9 % IV SOLN
1.0000 g | INTRAVENOUS | Status: AC
  Administered 2022-04-20 – 2022-04-22 (×3): 1 g via INTRAVENOUS
  Filled 2022-04-20: qty 10
  Filled 2022-04-20 (×2): qty 1

## 2022-04-20 MED ORDER — IOHEXOL 350 MG/ML SOLN
100.0000 mL | Freq: Once | INTRAVENOUS | Status: AC | PRN
  Administered 2022-04-20: 100 mL via INTRAVENOUS

## 2022-04-20 MED ORDER — THIAMINE HCL 100 MG PO TABS
100.0000 mg | ORAL_TABLET | Freq: Every day | ORAL | Status: DC
  Administered 2022-04-20 – 2022-04-29 (×7): 100 mg via ORAL
  Filled 2022-04-20 (×8): qty 1

## 2022-04-20 MED ORDER — MAGNESIUM SULFATE 4 GM/100ML IV SOLN
4.0000 g | Freq: Once | INTRAVENOUS | Status: AC
  Administered 2022-04-20: 4 g via INTRAVENOUS
  Filled 2022-04-20: qty 100

## 2022-04-20 MED ORDER — PANTOPRAZOLE SODIUM 40 MG PO TBEC
40.0000 mg | DELAYED_RELEASE_TABLET | Freq: Every day | ORAL | Status: DC
  Administered 2022-04-21: 40 mg via ORAL
  Filled 2022-04-20 (×2): qty 1

## 2022-04-20 MED ORDER — ASPIRIN 325 MG PO TABS
325.0000 mg | ORAL_TABLET | Freq: Every day | ORAL | Status: DC
  Filled 2022-04-20: qty 1

## 2022-04-20 MED ORDER — ADULT MULTIVITAMIN W/MINERALS CH
1.0000 | ORAL_TABLET | Freq: Every day | ORAL | Status: DC
  Administered 2022-04-20 – 2022-04-21 (×2): 1 via ORAL
  Filled 2022-04-20 (×3): qty 1

## 2022-04-20 MED ORDER — ACETAMINOPHEN 325 MG PO TABS: 650.0000 mg | ORAL_TABLET | ORAL | Status: DC | PRN

## 2022-04-20 MED ORDER — THIAMINE HCL 100 MG/ML IJ SOLN
100.0000 mg | Freq: Every day | INTRAMUSCULAR | Status: DC
  Administered 2022-04-22 – 2022-04-24 (×3): 100 mg via INTRAVENOUS
  Filled 2022-04-20 (×4): qty 2

## 2022-04-20 MED ORDER — LORAZEPAM 2 MG/ML IJ SOLN
1.0000 mg | INTRAMUSCULAR | Status: AC | PRN
  Administered 2022-04-22: 2 mg via INTRAVENOUS
  Filled 2022-04-20: qty 1

## 2022-04-20 MED ORDER — NICOTINE 14 MG/24HR TD PT24
14.0000 mg | MEDICATED_PATCH | Freq: Every day | TRANSDERMAL | Status: DC
Start: 2022-04-20 — End: 2022-04-29
  Administered 2022-04-20 – 2022-04-29 (×10): 14 mg via TRANSDERMAL
  Filled 2022-04-20 (×10): qty 1

## 2022-04-20 MED ORDER — ACETAMINOPHEN 160 MG/5ML PO SOLN: 650.0000 mg | ORAL | Status: DC | PRN

## 2022-04-20 MED ORDER — FOLIC ACID 1 MG PO TABS
1.0000 mg | ORAL_TABLET | Freq: Every day | ORAL | Status: DC
  Administered 2022-04-20 – 2022-04-21 (×2): 1 mg via ORAL
  Filled 2022-04-20 (×3): qty 1

## 2022-04-20 MED ORDER — MAGNESIUM OXIDE 400 MG PO TABS
400.0000 mg | ORAL_TABLET | Freq: Every day | ORAL | Status: DC
  Administered 2022-04-21: 400 mg via ORAL
  Filled 2022-04-20 (×6): qty 1

## 2022-04-20 MED ORDER — METOPROLOL TARTRATE 5 MG/5ML IV SOLN
2.5000 mg | Freq: Four times a day (QID) | INTRAVENOUS | Status: DC | PRN
  Administered 2022-04-20: 2.5 mg via INTRAVENOUS
  Filled 2022-04-20: qty 5

## 2022-04-20 MED ORDER — POTASSIUM CHLORIDE CRYS ER 20 MEQ PO TBCR
40.0000 meq | EXTENDED_RELEASE_TABLET | Freq: Once | ORAL | Status: AC
  Administered 2022-04-20: 40 meq via ORAL
  Filled 2022-04-20: qty 2

## 2022-04-20 MED ORDER — ATORVASTATIN CALCIUM 20 MG PO TABS
80.0000 mg | ORAL_TABLET | Freq: Every day | ORAL | Status: DC
  Administered 2022-04-20 – 2022-04-29 (×7): 80 mg via ORAL
  Filled 2022-04-20 (×3): qty 4
  Filled 2022-04-20 (×2): qty 1
  Filled 2022-04-20 (×2): qty 4
  Filled 2022-04-20: qty 1

## 2022-04-20 MED ORDER — ACETAMINOPHEN 650 MG RE SUPP: 650.0000 mg | RECTAL | Status: DC | PRN

## 2022-04-20 MED ORDER — STROKE: EARLY STAGES OF RECOVERY BOOK: Freq: Once | Status: AC

## 2022-04-20 MED ORDER — LORAZEPAM 1 MG PO TABS: 1.0000 mg | ORAL_TABLET | ORAL | Status: AC | PRN

## 2022-04-20 MED ORDER — METOPROLOL SUCCINATE ER 25 MG PO TB24
12.5000 mg | ORAL_TABLET | Freq: Every day | ORAL | Status: DC
  Administered 2022-04-20 – 2022-04-29 (×8): 12.5 mg via ORAL
  Filled 2022-04-20 (×6): qty 1
  Filled 2022-04-20: qty 0.5
  Filled 2022-04-20 (×2): qty 1

## 2022-04-20 MED ORDER — LEVETIRACETAM 500 MG PO TABS
500.0000 mg | ORAL_TABLET | Freq: Two times a day (BID) | ORAL | Status: DC
  Administered 2022-04-20 – 2022-04-21 (×3): 500 mg via ORAL
  Filled 2022-04-20 (×4): qty 1

## 2022-04-20 NOTE — Assessment & Plan Note (Signed)
Patient at risk for developing symptoms of alcohol withdrawal Monitor closely and administer lorazepam for CIWA score of 8 or greater Place patient on MVI/thiamine/folic acid

## 2022-04-20 NOTE — ED Notes (Signed)
Family at bedside. Describes "dazed" look by pt and he called 911. He didn't know his own brother at home. But now family advised he is normal. Pt has HX of TIA.

## 2022-04-20 NOTE — ED Notes (Signed)
Pt transported to CT for Code Stroke CT.

## 2022-04-20 NOTE — Assessment & Plan Note (Signed)
Most likely related to acute stroke We will cycle cardiac enzymes Follow-up results of 2D echocardiogram to rule out regional wall motion abnormality Continue high intensity statins

## 2022-04-20 NOTE — Progress Notes (Signed)
CODE STROKE- PHARMACY COMMUNICATION  Time CODE STROKE called/page received: 1421  Time response to CODE STROKE was made (in person or via phone): 1430  Time Stroke Kit retrieved from Pyxis (only if needed): not needed  Name of Provider/Nurse contacted: Dr. Stack  Past Medical History:  Diagnosis Date   Alcohol abuse    Dementia (HCC)    GERD (gastroesophageal reflux disease)    HLD (hyperlipidemia)    HTN (hypertension)    Seizures (HCC)    Stroke (HCC)    Prior to Admission medications   Medication Sig Start Date End Date Taking? Authorizing Provider  amLODipine (NORVASC) 5 MG tablet Take 1 tablet (5 mg total) by mouth daily. 05/12/21   Sreenath, Sudheer B, MD  apixaban (ELIQUIS) 5 MG TABS tablet Take 1 tablet (5 mg total) by mouth 2 (two) times daily. 05/11/21   Sreenath, Sudheer B, MD  atorvastatin (LIPITOR) 20 MG tablet Take 20 mg by mouth daily.    [provider]  levETIRAcetam (KEPPRA) 500 MG tablet Take 1 tablet (500 mg total) by mouth 2 (two) times daily. 12/01/20   Danford, Christopher P, MD  losartan (COZAAR) 25 MG tablet Take 25 mg by mouth daily.    [provider]  losartan (COZAAR) 50 MG tablet Take 50 mg by mouth at bedtime. 02/25/22   [provider]  magnesium oxide (MAG-OX) 400 MG tablet Take 400 mg by mouth daily.    [provider]  meloxicam (MOBIC) 7.5 MG tablet Take 7.5 mg by mouth daily as needed. 03/26/22   [provider]  metoprolol succinate (TOPROL-XL) 50 MG 24 hr tablet Take 1 tablet (50 mg total) by mouth daily. 12/01/20   Danford, Christopher P, MD  nicotine (NICODERM CQ - DOSED IN MG/24 HOURS) 14 mg/24hr patch Place 1 patch (14 mg total) onto the skin daily. 05/12/21   Sreenath, Sudheer B, MD  omeprazole (PRILOSEC) 40 MG capsule Take 40 mg by mouth daily.    [provider]  potassium chloride SA (KLOR-CON) 20 MEQ tablet Take 1 tablet (20 mEq total) by mouth 2 (two) times daily for 3 days. 10/16/21 10/19/21   McHugh, Kelly Rose, MD  traZODone (DESYREL) 100 MG tablet Take 1 tablet (100 mg total) by mouth at bedtime. 05/11/21   Sreenath, Sudheer B, MD    A , PharmD Pharmacy Resident  04/20/2022 2:33 PM  

## 2022-04-20 NOTE — Assessment & Plan Note (Signed)
Stable Will require increased nursing assistance for ADLs

## 2022-04-20 NOTE — Assessment & Plan Note (Addendum)
Patient noted to have pyuria Prior urine culture from 08/22 yields E. Coli He has completed antibiotic therapy for this diagnosis.

## 2022-04-20 NOTE — ED Notes (Signed)
This RN entered room to respond to bed exit alarm. PT attempting to exit bed, this RN and Printmaker assisted pt with urinal and then placed patient back in bed, male purwick placed on pt. Pt bed alarm turned on.

## 2022-04-20 NOTE — ED Notes (Signed)
CODE STROKE called to [facility] spoke with Baxter Flattery

## 2022-04-20 NOTE — ED Triage Notes (Signed)
BIB [facility] from home. Caregiver called 911 for lethargic. Last known well was last night. EMS advised CAOx3. Pt states he is weak. Unknown falls.  Vitals 170/91 CBG 139 HR 59 98.1

## 2022-04-20 NOTE — ED Notes (Signed)
Called lab to check on status of CMP ordered at 1400. Lab stated that the sample was hemolyzed and they needed a new green top. Lab redrawn and walked directly to lab for processing.

## 2022-04-20 NOTE — Assessment & Plan Note (Addendum)
Place patient on seizure precautions Continue Keppra 

## 2022-04-20 NOTE — ED Provider Notes (Signed)
[facility] Provider Note    Event Date/Time   First MD Initiated Contact with Patient 04/20/22 1407     (approximate)   History   Weakness   HPI  PARK BECK is a 81 y.o. male past medical history of atrial fibrillation on Eliquis vascular dementia alcohol use prior CVAs hypertension hyperlipidemia GERD who presents with difficulty speaking.  Patient is accompanied by his neighbor who is also his caretaker.  Patient's POA is his son who lives in [place].  Apparently he was at his baseline at 7 PM last night which is alert and oriented and can talk.  When the neighbor came over this being he noticed that he was not speaking like he normally does having difficulty speaking.    Past Medical History:  Diagnosis Date   Alcohol abuse    Dementia (HCC)    GERD (gastroesophageal reflux disease)    HLD (hyperlipidemia)    HTN (hypertension)    Seizures (HCC)    Stroke [facility])     Patient Active Problem List   Diagnosis Date Noted   Acute CVA (cerebrovascular accident) (HCC) 05/07/2021   Non compliance w medication regimen 05/07/2021   Dementia (HCC) 05/07/2021   AF (paroxysmal atrial fibrillation) (HCC) 05/07/2021   HTN (hypertension) 11/29/2020   Alcohol abuse 11/29/2020   AKI (acute kidney injury) (HCC) 11/29/2020   Atrial flutter with rapid ventricular response (HCC) 11/29/2020   Seizure (HCC) 02/22/2020   Pubic ramus fracture (HCC) 07/29/2019   Acetabular fracture (HCC) 07/29/2019   HLD (hyperlipidemia) 07/29/2019   GERD (gastroesophageal reflux disease) 07/29/2019   UTI (urinary tract infection) 07/29/2019     Physical Exam  Triage Vital Signs: ED Triage Vitals  Enc Vitals Group     BP 04/20/22 1251 (!) 179/92     Pulse Rate 04/20/22 1251 68     Resp 04/20/22 1251 16     Temp 04/20/22 1251 (!) 97.4 F (36.3 C)     Temp Source 04/20/22 1251 Oral     SpO2 04/20/22 1251 97 %     Weight 04/20/22 1247 139 lb 15.9 oz (63.5 kg)     Height  04/20/22 1247 5\' 5"  (1.651 m)     Head Circumference --      Peak Flow --      Pain Score 04/20/22 1247 0     Pain Loc --      Pain Edu? --      Excl. in GC? --     Most recent vital signs: Vitals:   04/20/22 1400 04/20/22 1430  BP: (!) 180/82 (!) 169/84  Pulse: (!) 45 (!) 45  Resp: (!) 25 18  Temp:    SpO2: 98% 100%     General: Awake, no distress.  Chronically ill-appearing CV:  Good peripheral perfusion.  Resp:  Normal effort.  Work of breathing Abd:  No distention.  Abdomen is soft and nontender Neuro:             Patient is significantly aphasic which limits the exam Other:   PERRL, EOMI, face symmetric, nml tongue movement  5/5 strength in the BL upper and lower extremities  Sensation grossly intact in the BL upper and lower extremities     ED Results / Procedures / Treatments  Labs (all labs ordered are listed, but only abnormal results are displayed) Labs Reviewed  CBC WITH DIFFERENTIAL/PLATELET - Abnormal; Notable for the following components:      Result Value  RBC 3.71 (*)    MCV 108.6 (*)    MCH 36.7 (*)    RDW 18.2 (*)    All other components within normal limits  TROPONIN I (HIGH SENSITIVITY) - Abnormal; Notable for the following components:   Troponin I (High Sensitivity) 64 (*)    All other components within normal limits  URINALYSIS, ROUTINE W REFLEX MICROSCOPIC  COMPREHENSIVE METABOLIC PANEL     EKG  EKG interpreted by me shows sinus rhythm with PVCs, negative beats have normal axis without acute ischemic changes   RADIOLOGY CT interpreted by myself no acute bleeding   PROCEDURES:  Critical Care performed: Yes, see critical care procedure note(s)  Procedures  The patient is on the cardiac monitor to evaluate for evidence of arrhythmia and/or significant heart rate changes.   MEDICATIONS ORDERED IN ED: Medications  iohexol (OMNIPAQUE) 350 MG/ML injection 100 mL (100 mLs Intravenous Contrast Given 04/20/22 1455)     IMPRESSION  / MDM / ASSESSMENT AND PLAN / ED COURSE  I reviewed the triage vital signs and the nursing notes.                              Differential diagnosis includes, but is not limited to, cute CVA, ischemic stroke, hemorrhagic stroke, recrudescence, metabolic abnormality  The patient is a 81 year old male with multiple CVAs and history of A-fib on Eliquis who presents with aphasia.  Patient is accompanied by his caregiver who is his neighbor.  Apparently he was normal last around 7 PM last night and was found to be aphasic today this morning.  On my evaluation patient does have significant aphasia and the rest of his exam is rather nonfocal.  I called a stroke alert given concern for potential LVO.  CT head shows possible new hypodensity in the right parietal lobe.  CTA per Dr. Selina Cooley is negative for acute LVO.  Patient's Theodis Aguas is 62 EKG does not have acute ischemic changes has been elevated in the past as well.  He has no white count no anemia.  Has had UTIs in the past so we will send urine to rule out recrudescence from underlying UTI.  He is also bradycardic EKG shows A-fib versus sinus tachycardia with PVCs.  Discussed with Dr. Selina Cooley who agrees with admission for stroke work-up.  He will need CIWA given his history of alcohol use.      FINAL CLINICAL IMPRESSION(S) / ED DIAGNOSES   Final diagnoses:  Aphasia     Rx / DC Orders   ED Discharge Orders     None        Note:  This document was prepared using Dragon voice recognition software and may include unintentional dictation errors.   Georga Hacking, MD 04/20/22 [phone]

## 2022-04-20 NOTE — Assessment & Plan Note (Addendum)
Patient presents for evaluation of dysarthria and CT scan of the head shows a new/interval area of hypodensity within the right parietal lobe, suggestive of infarct that is otherwise age-indeterminate CT angiogram is negative for LVO MRI confirms acute CVA of the posterior left frontal cortex and subcortical white matter, medial right parietal cortex and subcortical white matter, as well as additional small subacute infarcts in the bilateral cerebellar hemispheres. No hemorrhage mass effect or midline shift.  Permissive hypertension was allowed for the first 48 hours. 2D echocardiogram demonstrated no cardiac thrombus, LVEF was 55-60% with normal function and no regional wall motion abnormalities. Diastolic parameters were indeterminate. No atrial level shunt was obtained. Carotid US was performed on 05/08/2021 It demonstrated mild stenosis of the proximal right ICA dn left ICA. Vertebral arteries were patent with normal antegrade flow. This study was not repeated. We will request PT/OT/ST consult Discussed with neurologist who agrees with holding Eliquis for now and to start patient on aspirin in a.m. Start high intensity statin. He will follow up with neurology in 2 weeks.

## 2022-04-20 NOTE — Progress Notes (Signed)
   04/20/22 2017  Assess: MEWS Score  Temp 98.1 F (36.7 C)  BP (!) 144/93  Pulse Rate (!) 148  Resp 20  SpO2 97 %  O2 Device Room Air  Assess: MEWS Score  MEWS Temp 0  MEWS Systolic 0  MEWS Pulse 3  MEWS RR 0  MEWS LOC 0  MEWS Score 3  MEWS Score Color Yellow  Assess: if the MEWS score is Yellow or Red  Were vital signs taken at a resting state? Yes  Focused Assessment No change from prior assessment  Does the patient meet 2 or more of the SIRS criteria? No  Treat  Pain Scale PAINAD  Pain Score 0  Breathing 0  Negative Vocalization 0  Facial Expression 0  Body Language 0  Consolability 0  PAINAD Score 0  Notify: Charge Nurse/RN  Name of Charge Nurse/RN Notified Zettie Cooley  Date Charge Nurse/RN Notified 04/20/22  Time Charge Nurse/RN Notified 2017  Notify: Provider  Provider Name/Title Raphael Gibney  Date Provider Notified 04/20/22  Time Provider Notified 2017  Method of Notification  (secure chat)  Notification Reason Other (Comment) (elevated HR)  Provider response See new orders  Date of Provider Response 04/20/22  Time of Provider Response 2020  Document  Patient Outcome Stabilized after interventions  Assess: SIRS CRITERIA  SIRS Temperature  0  SIRS Pulse 1  SIRS Respirations  0  SIRS WBC 0  SIRS Score Sum  1

## 2022-04-20 NOTE — TOC Initial Note (Signed)
Transition of Care [facility]) - Initial/Assessment Note    Patient Details  Name: Richard Carter MRN: [id number] Date of Birth: [birth year]  [facility] CM/SW Contact:    Janyth Contes, [place] Phone Number: 04/20/2022, 4:32 PM  Clinical Narrative:                  [facility] consult for substance use concerns. CSW attached residential and outpatient substance use resources to patient's AVS to review when stable.   No further TOC needs at this time.        Patient Goals and CMS Choice        Expected Discharge Plan and Services                                                Prior Living Arrangements/Services                       Activities of Daily Living      Permission Sought/Granted                  Emotional Assessment              Admission diagnosis:  Acute CVA (cerebrovascular accident) [facility]) [I63.9] Patient Active Problem List   Diagnosis Date Noted   Nicotine dependence    Acute CVA (cerebrovascular accident) ([place]) 05/07/2021   Non compliance w medication regimen 05/07/2021   Dementia ([place]) 05/07/2021   AF (paroxysmal atrial fibrillation) ([place]) 05/07/2021   HTN (hypertension) 11/29/2020   Alcohol abuse 11/29/2020   AKI (acute kidney injury) ([place]) 11/29/2020   Atrial flutter with rapid ventricular response ([place]) 11/29/2020   Seizures ([place]) 02/22/2020   Pubic ramus fracture ([place]) 07/29/2019   Acetabular fracture ([place]) 07/29/2019   HLD (hyperlipidemia) 07/29/2019   GERD (gastroesophageal reflux disease) 07/29/2019   UTI (urinary tract infection) 07/29/2019   PCP:  Dion Body, MD Pharmacy:   [facility] [address] [place]) [place] [address] Phone: [phone] Fax: [phone]     Social Determinants of Health (SDOH) Interventions    Readmission Risk Interventions     View : No data to display.

## 2022-04-20 NOTE — Assessment & Plan Note (Addendum)
Patient has a history of medication noncompliance The patient has been counseled on the need to be compliant with prescribed medications

## 2022-04-20 NOTE — ED Notes (Signed)
Taken to CT.

## 2022-04-20 NOTE — Consult Note (Signed)
NEUROLOGY CONSULTATION NOTE   Date of service: Apr 20, 2022 Patient Name: Richard Carter MRN:  [id number] DOB:  01-22-1941 Reason for consult: stroke code Requesting physician: Dr. Augusto Gamble _ _ _   _ __   _ __ _ _  __ __   _ __   __ _  History of Present Illness    81 yo man with hx EtOH abuse, dementia, HL, HTN, stroke with residual L hemiparesis, seizure, a fib on eliquis BIB EMS for dysarthria and expressive aphasia. LKW 0730 when neighbor administered patient's medications. When he returned to check on him at 1130 he noticed slurred speech and difficulty finding his words. CT head showed age-indeterminant hypodensity R parietal lobe, potentially acute. TNK not administered 2/2 presentation outside the window and therapeutic anticoagulation with eliquis. CTA showed no LVO but did show 70% L ICA stenosis. CTP negative. Patient unable to provide significant hx 2/2 speech impairment and baseline dementia.  CNS iamging personally reviewed.   ROS   Per HPI: all other systems reviewed and are negative  Past History   I have reviewed the following:  Past Medical History:  Diagnosis Date   Alcohol abuse    Dementia (HCC)    GERD (gastroesophageal reflux disease)    HLD (hyperlipidemia)    HTN (hypertension)    Seizures (HCC)    Stroke [facility])    Past Surgical History:  Procedure Laterality Date   skin cyst removal     Family History  Problem Relation Age of Onset   Stroke Mother    Cancer Brother    Social History   Socioeconomic History   Marital status: Widowed    Spouse name: Not on file   Number of children: Not on file   Years of education: Not on file   Highest education level: Not on file  Occupational History   Not on file  Tobacco Use   Smoking status: Every Day    Packs/day: 0.50    Years: 68.00    Pack years: 34.00    Types: Cigarettes   Smokeless tobacco: Never  Vaping Use   Vaping Use: Never used  Substance and Sexual Activity   Alcohol use:  Yes    Comment: Drinks about 4oz of liquor daily. Last drink was yesterday 04/19/22.   Drug use: Never   Sexual activity: Not on file  Other Topics Concern   Not on file  Social History Narrative   Not on file   Social Determinants of Health   Financial Resource Strain: Not on file  Food Insecurity: Not on file  Transportation Needs: Not on file  Physical Activity: Not on file  Stress: Not on file  Social Connections: Not on file   No Known Allergies  Medications   Medications Prior to Admission  Medication Sig Dispense Refill Last Dose   amLODipine (NORVASC) 5 MG tablet Take 1 tablet (5 mg total) by mouth daily.   04/20/2022   apixaban (ELIQUIS) 5 MG TABS tablet Take 1 tablet (5 mg total) by mouth 2 (two) times daily. 60 tablet  04/20/2022   atorvastatin (LIPITOR) 20 MG tablet Take 20 mg by mouth daily.   04/20/2022   levETIRAcetam (KEPPRA) 500 MG tablet Take 1 tablet (500 mg total) by mouth 2 (two) times daily. 60 tablet 3 04/20/2022   losartan (COZAAR) 50 MG tablet Take 50 mg by mouth daily.   04/20/2022   magnesium oxide (MAG-OX) 400 MG tablet Take 400 mg by mouth  daily.   04/20/2022   meloxicam (MOBIC) 7.5 MG tablet Take 7.5 mg by mouth daily as needed.   04/20/2022   metoprolol succinate (TOPROL-XL) 50 MG 24 hr tablet Take 1 tablet (50 mg total) by mouth daily. 30 tablet 3 04/20/2022   omeprazole (PRILOSEC) 40 MG capsule Take 40 mg by mouth daily.   04/20/2022   traZODone (DESYREL) 100 MG tablet Take 1 tablet (100 mg total) by mouth at bedtime.   04/19/2022   losartan (COZAAR) 25 MG tablet Take 25 mg by mouth daily. (Patient not taking: Reported on 04/20/2022)   Not Taking   nicotine (NICODERM CQ - DOSED IN MG/24 HOURS) 14 mg/24hr patch Place 1 patch (14 mg total) onto the skin daily. (Patient not taking: Reported on 04/20/2022) 28 patch 0 Not Taking   potassium chloride SA (KLOR-CON) 20 MEQ tablet Take 1 tablet (20 mEq total) by mouth 2 (two) times daily for 3 days. 6 tablet 0        Current Facility-Administered Medications:    acetaminophen (TYLENOL) tablet 650 mg, 650 mg, Oral, Q4H PRN **OR** acetaminophen (TYLENOL) 160 MG/5ML solution 650 mg, 650 mg, Per Tube, Q4H PRN **OR** acetaminophen (TYLENOL) suppository 650 mg, 650 mg, Rectal, Q4H PRN, Agbata, Tochukwu, MD   atorvastatin (LIPITOR) tablet 80 mg, 80 mg, Oral, Daily, Agbata, Tochukwu, MD, 80 mg at 04/20/22 1903   cefTRIAXone (ROCEPHIN) 1 g in sodium chloride 0.9 % 100 mL IVPB, 1 g, Intravenous, Q24H, Agbata, Tochukwu, MD, Last Rate: 200 mL/hr at 04/20/22 1917, 1 g at 04/20/22 1917   folic acid (FOLVITE) tablet 1 mg, 1 mg, Oral, Daily, Agbata, Tochukwu, MD, 1 mg at 04/20/22 1903   levETIRAcetam (KEPPRA) tablet 500 mg, 500 mg, Oral, BID, Agbata, Tochukwu, MD   LORazepam (ATIVAN) tablet 1-4 mg, 1-4 mg, Oral, Q1H PRN **OR** LORazepam (ATIVAN) injection 1-4 mg, 1-4 mg, Intravenous, Q1H PRN, Agbata, Tochukwu, MD   [START ON 04/21/2022] magnesium oxide (MAG-OX) tablet 400 mg, 400 mg, Oral, Daily, Agbata, Tochukwu, MD   magnesium sulfate IVPB 4 g 100 mL, 4 g, Intravenous, Once, Agbata, Tochukwu, MD, Last Rate: 50 mL/hr at 04/20/22 1921, 4 g at 04/20/22 1921   metoprolol succinate (TOPROL-XL) 24 hr tablet 12.5 mg, 12.5 mg, Oral, Daily, Hall, Carole N, DO   metoprolol tartrate (LOPRESSOR) injection 2.5 mg, 2.5 mg, Intravenous, Q6H PRN, Hall, Carole N, DO   multivitamin with minerals tablet 1 tablet, 1 tablet, Oral, Daily, Agbata, Tochukwu, MD, 1 tablet at 04/20/22 1903   nicotine (NICODERM CQ - dosed in mg/24 hours) patch 14 mg, 14 mg, Transdermal, Daily, Agbata, Tochukwu, MD, 14 mg at 04/20/22 1904   [START ON 04/21/2022] pantoprazole (PROTONIX) EC tablet 40 mg, 40 mg, Oral, Daily, Agbata, Tochukwu, MD   thiamine tablet 100 mg, 100 mg, Oral, Daily, 100 mg at 04/20/22 1903 **OR** thiamine (B-1) injection 100 mg, 100 mg, Intravenous, Daily, Agbata, Tochukwu, MD   traZODone (DESYREL) tablet 100 mg, 100 mg, Oral, QHS, Agbata,  Tochukwu, MD  Vitals   Vitals:   04/20/22 1530 04/20/22 1600 04/20/22 1701 04/20/22 2017  BP: (!) 177/91 (!) 182/78 (!) 169/78 (!) 144/93  Pulse: (!) 46 66 (!) 50 (!) 148  Resp: 18 15 18 20   Temp:   98.8 F (37.1 C) 98.1 F (36.7 C)  TempSrc:      SpO2: 100% 99% (!) 86% 97%  Weight:      Height:         Body mass index is  23.3 kg/m.  Physical Exam   Physical Exam Gen: alert, oriented to self and neighbor, follows some but not all simple commands HEENT: Atraumatic, normocephalic;mucous membranes moist; oropharynx clear, tongue without atrophy or fasciculations. Neck: Supple, trachea midline. Resp: CTAB, no w/r/r CV: RRR, no m/g/r; nml S1 and S2. 2+ symmetric peripheral pulses. Abd: soft/NT/ND; nabs x 4 quad Extrem: Nml bulk; no cyanosis, clubbing, or edema.  Neuro: *MS: alert, oriented to self and neighbor, follows some but not all simple commands *Speech: severe dysarthria, moderate naming impairment *CN: PERRL, blinks to threat bilat, EOMI, sensation intact, L UMN facial droop, hearing intact to voice *Motor: no drift in any extremity *Sensory: SILT *Reflexes:  2+ and symmetric throughout without clonus; toes down-going bilat *Coordination, gait: UTA  NIHSS  1a Level of Conscious.: 0 1b LOC Questions: 2 1c LOC Commands: 1 2 Best Gaze: 0 3 Visual: 0 4 Facial Palsy: 1 5a Motor Arm - left: 0 5b Motor Arm - Right: 0 6a Motor Leg - Left: 0 6b Motor Leg - Right: 0 7 Limb Ataxia: 0 8 Sensory: 0 9 Best Language: 2 10 Dysarthria: 1 11 Extinct. and Inatten.: 0  TOTAL: 7   Premorbid mRS = 4   Labs   CBC:  Recent Labs  Lab 04/20/22 1253  WBC 6.6  NEUTROABS 4.5  HGB 13.6  HCT 40.3  MCV 108.6*  PLT 198    Basic Metabolic Panel:  Lab Results  Component Value Date   NA 138 04/20/2022   K 3.2 (L) 04/20/2022   CO2 28 04/20/2022   GLUCOSE 93 04/20/2022   BUN 26 (H) 04/20/2022   CREATININE 1.33 (H) 04/20/2022   CALCIUM 8.9 04/20/2022   GFRNONAA 53  (L) 04/20/2022   GFRAA >60 02/24/2020   Lipid Panel:  Lab Results  Component Value Date   LDLCALC 34 05/08/2021   HgbA1c:  Lab Results  Component Value Date   HGBA1C 5.2 05/08/2021   Urine Drug Screen:     Component Value Date/Time   LABOPIA NONE DETECTED 02/22/2020 1751   COCAINSCRNUR NONE DETECTED 02/22/2020 1751   LABBENZ POSITIVE (A) 02/22/2020 1751   AMPHETMU NONE DETECTED 02/22/2020 1751   THCU NONE DETECTED 02/22/2020 1751   LABBARB NONE DETECTED 02/22/2020 1751    Alcohol Level     Component Value Date/Time   ETH <10 11/30/2020 0852     Impression   81 yo man with hx EtOH abuse, dementia, HL, HTN, stroke with residual L hemiparesis, seizure, a fib on eliquis BIB EMS for dysarthria and expressive aphasia c/f acute ischemic infarct.  Recommendations   - Admit to hospitalist service for stroke w/u - Permissive HTN x48 hrs from sx onset or until stroke ruled out by MRI goal BP <220/110. PRN labetalol or hydralazine if BP above these parameters. Avoid oral antihypertensives. - MRI brain wo contrast - TTE w/ bubble - Check A1c and LDL + add statin per guidelines - Hold eliquis while awaiting MRI brain. Neurology to provide further guidance once images are available for review - q4 hr neuro checks - STAT head CT for any change in neuro exam - Tele - PT/OT/SLP - Stroke education - Amb referral to neurology upon discharge   ______________________________________________________________________   Thank you for the opportunity to take part in the care of this patient. If you have any further questions, please contact the neurology consultation attending.  Signed,  Bing Neighborsolleen Christain Niznik, MD [facility] [phone]  If 7pm- 7am, please page neurology on  call as listed in [place].

## 2022-04-20 NOTE — Assessment & Plan Note (Addendum)
Patient has a history of paroxysmal A-fib and may not be compliant with prescribed Eliquis as secondary prophylaxis for acute stroke. Hold Eliquis now due to acute stroke and acute GI bleed. Decision to restart eliquis is left to PCP and Gastroenterology.

## 2022-04-20 NOTE — Assessment & Plan Note (Addendum)
Place patient on nicotine transdermal patch 14 mg daily

## 2022-04-20 NOTE — Assessment & Plan Note (Addendum)
We will allow for permissive hypertension due to acute stroke Amlodipine, metoprolol and Cozaar have been restarted. The patient is tolerating BP well.

## 2022-04-20 NOTE — H&P (Addendum)
History and Physical    Patient: Richard Carter RCB:[id number] DOB: 11-Jun-1941 DOA: 04/20/2022 DOS: the patient was seen and examined on 04/20/2022 PCP: Richard Ivan, MD  Patient coming from: Home  Chief Complaint:  Chief Complaint  Patient presents with   Weakness   Most of the history was obtained from patient's caregiver Richard Carter who was at the bedside HPI: Richard Carter is a 81 y.o. male with medical history significant for dementia, history of CVA with left-sided hemiparesis, GERD, alcohol abuse, nicotine dependence, seizures who was brought into the ER by EMS for evaluation of mental status changes. Per patient's caregiver his last known well was about 7.30 AM on the morning of admission when he administered patient's medications.  He noted that patient was lethargic and had slurred speech at about 11:30 AM on the day of admission and so he called EMS. He states that patient drinks alcohol daily and smokes about 10 cigarettes a day.  He also states that he administers the patient's medications but can't "force him" to take the meds. Code stroke was called and patient's initial CT scan of the head showed new/interval area of hypodensity within the right parietal lobe, suggestive of infarct that is otherwise age indeterminate by CT. Consider MRI for more sensitive evaluation. No acute hemorrhage Remote corona radiata infarcts and chronic microvascular ischemic disease. Patient had a CT angiogram which showed extensive atherosclerosis at the left carotid bifurcation with severe (approximately 70%) stenosis of the left ICA origin. Aortic Atherosclerosis and Emphysema . CT perfusion: No evidence of penumbra or core infarct. I am unable to do a review of systems on this patient due to his dysarthria Patient did not receive tPA since his last dose of Eliquis was on the morning of his admission.   Review of Systems: As mentioned in the history of present illness. All other systems  reviewed and are negative. Past Medical History:  Diagnosis Date   Alcohol abuse    Dementia (HCC)    GERD (gastroesophageal reflux disease)    HLD (hyperlipidemia)    HTN (hypertension)    Seizures (HCC)    Stroke [facility])    Past Surgical History:  Procedure Laterality Date   skin cyst removal     Social History:  reports that he has been smoking. He has never used smokeless tobacco. He reports current alcohol use. No history on file for drug use.  No Known Allergies  Family History  Problem Relation Age of Onset   Stroke Mother    Cancer Brother     Prior to Admission medications   Medication Sig Start Date End Date Taking? Authorizing Provider  amLODipine (NORVASC) 5 MG tablet Take 1 tablet (5 mg total) by mouth daily. 05/12/21  Yes Sreenath, Jonelle Sports, MD  apixaban (ELIQUIS) 5 MG TABS tablet Take 1 tablet (5 mg total) by mouth 2 (two) times daily. 05/11/21  Yes Sreenath, Sudheer B, MD  atorvastatin (LIPITOR) 20 MG tablet Take 20 mg by mouth daily.   Yes [provider]  levETIRAcetam (KEPPRA) 500 MG tablet Take 1 tablet (500 mg total) by mouth 2 (two) times daily. 12/01/20  Yes Danford, Earl Lites, MD  losartan (COZAAR) 50 MG tablet Take 50 mg by mouth daily. 02/25/22  Yes [provider]  magnesium oxide (MAG-OX) 400 MG tablet Take 400 mg by mouth daily.   Yes [provider]  meloxicam (MOBIC) 7.5 MG tablet Take 7.5 mg by mouth daily as needed. 03/26/22  Yes [provider]  metoprolol succinate (TOPROL-XL) 50 MG 24 hr tablet Take 1 tablet (50 mg total) by mouth daily. 12/01/20  Yes Danford, Earl Lites, MD  omeprazole (PRILOSEC) 40 MG capsule Take 40 mg by mouth daily.   Yes [provider]  traZODone (DESYREL) 100 MG tablet Take 1 tablet (100 mg total) by mouth at bedtime. 05/11/21  Yes Sreenath, Sudheer B, MD  losartan (COZAAR) 25 MG tablet Take 25 mg by mouth daily. Patient not taking: Reported on 04/20/2022    [provider]  nicotine (NICODERM CQ - DOSED IN MG/24 HOURS) 14 mg/24hr patch Place 1 patch (14 mg total) onto the skin daily. Patient not taking: Reported on 04/20/2022 05/12/21   Lolita Patella B, MD  potassium chloride SA (KLOR-CON) 20 MEQ tablet Take 1 tablet (20 mEq total) by mouth 2 (two) times daily for 3 days. 10/16/21 10/19/21  Georga Hacking, MD    Physical Exam: Vitals:   04/20/22 1400 04/20/22 1430 04/20/22 1530 04/20/22 1600  BP: (!) 180/82 (!) 169/84 (!) 177/91 (!) 182/78  Pulse: (!) 45 (!) 45 (!) 46 66  Resp: (!) 25 18 18 15   Temp:      TempSrc:      SpO2: 98% 100% 100% 99%  Weight:      Height:       Physical Exam Vitals and nursing note reviewed.  Constitutional:      Comments: Chronically ill-appearing.  Disheveled.  Oriented to person and place.  Able to move all extremities  HENT:     Head: Normocephalic and atraumatic.     Nose: Nose normal.     Mouth/Throat:     Mouth: Mucous membranes are moist.  Eyes:     Pupils: Pupils are equal, round, and reactive to light.  Cardiovascular:     Rate and Rhythm: Bradycardia present. Rhythm irregular.  Pulmonary:     Effort: Pulmonary effort is normal.     Breath sounds: Normal breath sounds.  Abdominal:     General: Abdomen is flat. Bowel sounds are normal.     Palpations: Abdomen is soft.  Musculoskeletal:        General: Normal range of motion.     Cervical back: Normal range of motion and neck supple.  Skin:    General: Skin is warm and dry.     Comments: Skin tear involving the right forearm  Neurological:     Comments: Oriented to person and place.  Able to move all extremities.  Has dysarthria and word finding difficulty  Psychiatric:        Mood and Affect: Mood normal.        Behavior: Behavior normal.    Data Reviewed: Relevant notes from primary care and specialist visits, past discharge summaries as available in EHR, including Care Everywhere. Prior diagnostic testing as pertinent to current admission  diagnoses Updated medications and problem lists for reconciliation ED course, including vitals, labs, imaging, treatment and response to treatment Triage notes, nursing and pharmacy notes and ED provider's notes Notable results as noted in HPI Labs reviewed.  Troponin 64, white count 6.6, hemoglobin 13.6, hematocrit 40.3, platelet count 198, MCV 108.6 Patient has pyuria CT scan of the head without contrast shows new/interval area of hypodensity within the right parietal lobe, suggestive of infarct that is otherwise age indeterminate by CT. Consider MRI for more sensitive evaluation. No acute hemorrhage. Remote corona radiata infarcts and chronic microvascular ischemic disease CT angiogram of the head and  neck shows no emergent large vessel occlusion. Moderate proximal left intradural vertebral artery stenosis. Extensive atherosclerosis at the left carotid bifurcation with severe (approximately 70%) stenosis of the left ICA origin. Aortic Atherosclerosis and Emphysema. CT perfusion: No evidence of penumbra or core infarct. Twelve-lead EKG reviewed by me shows A-fib with nonspecific T wave changes in the inferior leads. There are no new results to review at this time.  Assessment and Plan: * Acute CVA (cerebrovascular accident) [facility](HCC) Patient presents for evaluation of dysarthria and CT scan of the head shows a new/interval area of hypodensity within the right parietal lobe, suggestive of infarct that is otherwise age-indeterminate CT angiogram is negative for LVO Obtain MRI of the brain Allow for permissive hypertension Obtain 2D echocardiogram to assess LVEF and rule out cardiac thrombus We will request PT/OT/ST consult Discussed with neurologist who agrees with holding Eliquis for now and to start patient on aspirin in a.m. if MRI confirms an acute stroke  UTI (urinary tract infection) Patient noted to have pyuria Prior urine culture from 08/22 yields E. Coli Start patient empirically  on Rocephin 1 g IV daily Follow-up results of urine culture  AF (paroxysmal atrial fibrillation) (HCC) Patient has a history of paroxysmal A-fib and may not be compliant with prescribed Eliquis as secondary prophylaxis for acute stroke. Hold metoprolol for now to allow for permissive hypertension Hold Eliquis due to acute stroke  Non compliance w medication regimen Patient has a history of medication noncompliance We will counsel patient on the need to be compliant with prescribed medications  Seizures (HCC) Place patient on seizure precautions Continue Keppra  HTN (hypertension) We will allow for permissive hypertension due to acute stroke Hold amlodipine, metoprolol and Cozaar  Dementia (HCC) Stable Will require increased nursing assistance for ADLs  Alcohol abuse Patient at risk for developing symptoms of alcohol withdrawal Monitor closely and administer lorazepam for CIWA score of 8 or greater Place patient on MVI/thiamine/folic acid  Nicotine dependence Place patient on nicotine transdermal patch 14 mg daily      Advance Care Planning:   Code Status: DNR   Consults: Neurology  Family Communication: Attempted to reach patient's son and healthcare power of attorney Kathlee NationsDonald Cremeens on the phone.  Left voicemail, awaiting callback. Patient's caregiver Richard OhmChris states that patient has a universal DNR and will bring him to the hospital Severity of Illness: The appropriate patient status for this patient is INPATIENT. Inpatient status is judged to be reasonable and necessary in order to provide the required intensity of service to ensure the patient's safety. The patient's presenting symptoms, physical exam findings, and initial radiographic and laboratory data in the context of their chronic comorbidities is felt to place them at high risk for further clinical deterioration. Furthermore, it is not anticipated that the patient will be medically stable for discharge from the  hospital within 2 midnights of admission.   * I certify that at the point of admission it is my clinical judgment that the patient will require inpatient hospital care spanning beyond 2 midnights from the point of admission due to high intensity of service, high risk for further deterioration and high frequency of surveillance required.*  Author: Lucile Shuttersochukwu Aiana Nordquist, MD 04/20/2022 4:34 PM  For on call review [email or web address].

## 2022-04-21 ENCOUNTER — Encounter: Payer: Self-pay | Admitting: Internal Medicine

## 2022-04-21 DIAGNOSIS — N3001 Acute cystitis with hematuria: Secondary | ICD-10-CM | POA: Diagnosis not present

## 2022-04-21 DIAGNOSIS — I480 Paroxysmal atrial fibrillation: Secondary | ICD-10-CM

## 2022-04-21 DIAGNOSIS — I639 Cerebral infarction, unspecified: Secondary | ICD-10-CM | POA: Diagnosis not present

## 2022-04-21 LAB — BASIC METABOLIC PANEL
Anion gap: 8 (ref 5–15)
BUN: 26 mg/dL — ABNORMAL HIGH (ref 8–23)
CO2: 27 mmol/L (ref 22–32)
Calcium: 9.5 mg/dL (ref 8.9–10.3)
Chloride: 103 mmol/L (ref 98–111)
Creatinine, Ser: 1.36 mg/dL — ABNORMAL HIGH (ref 0.61–1.24)
GFR, Estimated: 52 mL/min — ABNORMAL LOW (ref >60)
Glucose, Bld: 181 mg/dL — ABNORMAL HIGH (ref 70–99)
Potassium: 3.2 mmol/L — ABNORMAL LOW (ref 3.5–5.1)
Sodium: 138 mmol/L (ref 135–145)

## 2022-04-21 LAB — LIPID PANEL
Cholesterol: 143 mg/dL (ref 0–200)
HDL: 70 mg/dL (ref >40)
LDL Cholesterol: 59 mg/dL (ref 0–99)
Total CHOL/HDL Ratio: 2 RATIO
Triglycerides: 72 mg/dL (ref <150)
VLDL: 14 mg/dL (ref 0–40)

## 2022-04-21 LAB — MAGNESIUM: Magnesium: 2.2 mg/dL (ref 1.7–2.4)

## 2022-04-21 LAB — PHOSPHORUS: Phosphorus: 2.9 mg/dL (ref 2.5–4.6)

## 2022-04-21 MED ORDER — POTASSIUM CHLORIDE 20 MEQ PO PACK
40.0000 meq | PACK | Freq: Once | ORAL | Status: AC
  Administered 2022-04-21: 40 meq via ORAL
  Filled 2022-04-21: qty 2

## 2022-04-21 MED ORDER — CHLORDIAZEPOXIDE HCL 25 MG PO CAPS
25.0000 mg | ORAL_CAPSULE | Freq: Four times a day (QID) | ORAL | Status: DC
  Administered 2022-04-21 (×4): 25 mg via ORAL
  Filled 2022-04-21 (×5): qty 1

## 2022-04-21 MED ORDER — SODIUM CHLORIDE 0.9 % IV SOLN: INTRAVENOUS | Status: DC

## 2022-04-21 MED ORDER — ASPIRIN 81 MG PO CHEW
81.0000 mg | CHEWABLE_TABLET | Freq: Every day | ORAL | Status: DC
  Administered 2022-04-21 – 2022-04-22 (×2): 81 mg via ORAL
  Filled 2022-04-21 (×2): qty 1

## 2022-04-21 MED ORDER — SODIUM CHLORIDE 0.9 % IV SOLN: 12.5000 mg | Freq: Once | INTRAVENOUS | Status: DC

## 2022-04-21 MED ORDER — POTASSIUM CHLORIDE IN NACL 40-0.9 MEQ/L-% IV SOLN
INTRAVENOUS | Status: AC
  Filled 2022-04-21 (×4): qty 1000

## 2022-04-21 NOTE — Evaluation (Signed)
Physical Therapy Evaluation Patient Details Name: Richard CashLarry L Carter MRN: [id number] DOB: 05/05/1941 Today's Date: 04/21/2022  History of Present Illness  Pt is an 81 y.o. male presenting to hospital 5/27 with dysarthria and expressive aphasia.  Imaging showing "Acute infarcts in the posterior left frontal cortex and subcortical white matter, medial right parietal cortex and subcortical white matter, and right frontal white matter, with additional small subacute infarcts in the bilateral cerebellar hemispheres".  Pt admitted with acute CVA, UTI, a-fib, and h/o medication non-compliance.  PMH includes alcohol abuse, dementia, htn, seizures, stroke (with L sided hemiparesis), a-fib, AKI, pubic ramus and acetabular fx 2020, and UTI.  Clinical Impression  Prior to hospital admission, per pt's son, pt was ambulatory (using RW or holding onto walls within home to walk); lives alone in 1 level home with 2 STE B railings; has neighbor who is his caretaker and checks on pt during the day.  Pt's speech difficult to understand but pt did appear to have some generalized confusion during session.  Currently pt is SBA semi-supine to sitting edge of bed; min assist with transfers using RW; and min assist to ambulate 80 feet with RW use.  Pt requiring vc's for safe transfers and ambulation technique during session.  Pt would benefit from skilled PT to address noted impairments and functional limitations (see below for any additional details).  Upon hospital discharge, pt would benefit from SNF.    Recommendations for follow up therapy are one component of a multi-disciplinary discharge planning process, led by the attending physician.  Recommendations may be updated based on patient status, additional functional criteria and insurance authorization.  Follow Up Recommendations Skilled nursing-short term rehab (<3 hours/day)    Assistance Recommended at Discharge Frequent or constant Supervision/Assistance  Patient can  return home with the following  A little help with walking and/or transfers;A little help with bathing/dressing/bathroom;Assistance with cooking/housework;Direct supervision/assist for medications management;Direct supervision/assist for financial management;Assist for transportation;Help with stairs or ramp for entrance    Equipment Recommendations Rolling walker (2 wheels);BSC/3in1  Recommendations for Other Services  OT consult    Functional Status Assessment Patient has had a recent decline in their functional status and demonstrates the ability to make significant improvements in function in a reasonable and predictable amount of time.     Precautions / Restrictions Precautions Precautions: Fall Precaution Comments: Seizure precautions Restrictions Weight Bearing Restrictions: No      Mobility  Bed Mobility Overal bed mobility: Needs Assistance Bed Mobility: Supine to Sit     Supine to sit: Supervision, HOB elevated     General bed mobility comments: mild increased effort to perform on own    Transfers Overall transfer level: Needs assistance Equipment used: Rolling walker (2 wheels) Transfers: Sit to/from Stand Sit to Stand: Min assist           General transfer comment: vc's for UE placement and safety; assist to initiate stand and control descent sitting    Ambulation/Gait Ambulation/Gait assistance: Min assist Gait Distance (Feet): 80 Feet Assistive device: Rolling walker (2 wheels)   Gait velocity: decreased     General Gait Details: forward flexed posture requiring vc's intermittently for upright posture; vc's to stay closer to RW; decreased B LE step length  Stairs            Wheelchair Mobility    Modified Rankin (Stroke Patients Only)       Balance Overall balance assessment: Needs assistance Sitting-balance support: No upper extremity supported, Feet supported Sitting  balance-Leahy Scale: Good Sitting balance - Comments: steady  sitting reaching within BOS   Standing balance support: Bilateral upper extremity supported, During functional activity Standing balance-Leahy Scale: Poor Standing balance comment: assist for balance when ambulating using RW                             Pertinent Vitals/Pain Pain Assessment Pain Assessment: Faces Faces Pain Scale: No hurt Pain Intervention(s): Limited activity within patient's tolerance, Monitored during session, Repositioned Vitals (HR and O2 on room air) stable and WFL throughout treatment session.    Home Living Family/patient expects to be discharged to:: Skilled nursing facility Living Arrangements: Alone (caretaker (neighbor) lives next door) Available Help at Discharge: Neighbor Type of Home: Mobile home Home Access: Stairs to enter Entrance Stairs-Rails: Doctor, general practice of Steps: 2   Home Layout: One level Home Equipment: Grab bars - tub/shower;Shower seat;Grab bars - toilet;Rolling Walker (2 wheels)      Prior Function Prior Level of Function : Needs assist             Mobility Comments: Modified independent ambulating with RW or holding onto walls when ambulating within home.  No recent falls reports ADLs Comments: Assist for medications and meals; gets assisted with bath every Sunday.     Hand Dominance        Extremity/Trunk Assessment   Upper Extremity Assessment Upper Extremity Assessment: Defer to OT evaluation    Lower Extremity Assessment Lower Extremity Assessment: Difficult to assess due to impaired cognition    Cervical / Trunk Assessment Cervical / Trunk Assessment: Other exceptions Cervical / Trunk Exceptions: forward head/shoulders  Communication   Communication: Expressive difficulties  Cognition Arousal/Alertness: Awake/alert Behavior During Therapy: Flat affect Overall Cognitive Status: Difficult to assess                                 General Comments: Oriented to at  least name/DOB, place, month/year, and general situation.  Generalized confusion noted during session.  Pt's speech very difficult to understand.        General Comments  Nursing cleared pt for participation in physical therapy.  Pt agreeable to PT session.  Pt's son present beginning of session but left shortly after.    Exercises     Assessment/Plan    PT Assessment Patient needs continued PT services  PT Problem List Decreased strength;Decreased activity tolerance;Decreased balance;Decreased mobility;Decreased knowledge of use of DME;Decreased safety awareness;Decreased knowledge of precautions       PT Treatment Interventions DME instruction;Gait training;Stair training;Functional mobility training;Therapeutic activities;Therapeutic exercise;Balance training;Patient/family education    PT Goals (Current goals can be found in the Care Plan section)  Acute Rehab PT Goals Patient Stated Goal: to improve mobility PT Goal Formulation: With patient/family Time For Goal Achievement: 05/05/22 Potential to Achieve Goals: Fair    Frequency 7X/week     Co-evaluation               AM-PAC PT "6 Clicks" Mobility  Outcome Measure Help needed turning from your back to your side while in a flat bed without using bedrails?: None Help needed moving from lying on your back to sitting on the side of a flat bed without using bedrails?: A Little Help needed moving to and from a bed to a chair (including a wheelchair)?: A Little Help needed standing up from a chair using your arms (  e.g., wheelchair or bedside chair)?: A Little Help needed to walk in hospital room?: A Little Help needed climbing 3-5 steps with a railing? : A Lot 6 Click Score: 18    End of Session Equipment Utilized During Treatment: Gait belt Activity Tolerance: Patient tolerated treatment well Patient left: in chair;with call bell/phone within reach;with chair alarm set Nurse Communication: Mobility  status;Precautions PT Visit Diagnosis: Unsteadiness on feet (R26.81);Other abnormalities of gait and mobility (R26.89);Muscle weakness (generalized) (M62.81)    Time: 6222-9798 PT Time Calculation (min) (ACUTE ONLY): 27 min   Charges:   PT Evaluation $PT Eval Low Complexity: 1 Low PT Treatments $Therapeutic Activity: 8-22 mins       Hendricks Limes, PT 04/21/22, 12:08 PM

## 2022-04-21 NOTE — Progress Notes (Addendum)
Progress Note    Richard Carter  EXH:[id number] DOB: 02/11/1941  DOA: 04/20/2022 PCP: Marisue Ivan, MD      Brief Narrative:    Medical records reviewed and are as summarized below:  Richard Carter is a 81 y.o. male with medical history significant for dementia, history of stroke with left-sided hemiparesis, seizure disorder, hypertension, GERD, alcohol use disorder, tobacco use disorder, who was brought to the hospital for evaluation of altered mental status (lethargy) and difficulty speaking.     Assessment/Plan:   Principal Problem:   Acute CVA (cerebrovascular accident) (HCC) Active Problems:   UTI (urinary tract infection)   AF (paroxysmal atrial fibrillation) (HCC)   Non compliance w medication regimen   Seizures (HCC)   HTN (hypertension)   Dementia (HCC)   Alcohol abuse   Nicotine dependence   Elevated troponin    Body mass index is 23.3 kg/m.  Acute stroke, suspect embolic stroke, severe left ICA stenosis (70%): He has been started on aspirin.  Neurologist recommended substituting his Eliquis for aspirin after 5 days of aspirin therapy.  Continue Lipitor.  PT and OT evaluation.  2D echo is pending.  Outpatient follow-up with vascular surgeon.  Paroxysmal atrial fibrillation: Eliquis on hold.  Continue metoprolol  Dehydration and poor oral intake: Continue IV fluids  Abnormal urinalysis, suspected UTI: Urine culture has been ordered.  Continue IV Rocephin for now.  Hypokalemia and hypomagnesemia: Replete potassium and magnesium levels replete as needed.  Check phosphorus level.  Alcohol use disorder: Use Ativan as needed per CIWA protocol.  Start Librium and taper off after few days  Hypertension: Resume amlodipine and losartan tomorrow  Other comorbidities include seizure disorder, dementia, tobacco use disorder, medical nonadherence    Diet Order             Diet 2 gram sodium Room service appropriate? Yes; Fluid consistency: Thin   Diet effective now                            Consultants: Neurologist  Procedures: None    Medications:    aspirin  81 mg Oral Daily   atorvastatin  80 mg Oral Daily   chlordiazePOXIDE  25 mg Oral QID   folic acid  1 mg Oral Daily   levETIRAcetam  500 mg Oral BID   magnesium oxide  400 mg Oral Daily   metoprolol succinate  12.5 mg Oral Daily   multivitamin with minerals  1 tablet Oral Daily   nicotine  14 mg Transdermal Daily   pantoprazole  40 mg Oral Daily   thiamine  100 mg Oral Daily   Or   thiamine  100 mg Intravenous Daily   traZODone  100 mg Oral QHS   Continuous Infusions:  0.9 % NaCl with KCl 40 mEq / L 50 mL/hr at 04/21/22 0853   cefTRIAXone (ROCEPHIN)  IV Stopped (04/20/22 2008)     Anti-infectives (From admission, onward)    Start     Dose/Rate Route Frequency Ordered Stop   04/20/22 1700  cefTRIAXone (ROCEPHIN) 1 g in sodium chloride 0.9 % 100 mL IVPB        1 g 200 mL/hr over 30 Minutes Intravenous Every 24 hours 04/20/22 1636 04/23/22 1659              Family Communication/Anticipated D/C date and plan/Code Status   DVT prophylaxis: SCD's Start: 04/20/22 1549  Code Status: DNR  Family Communication: Plan discussed with Iantha Fallen, brother at the bedside Disposition Plan: Plan to discharge to SNF   Status is: Inpatient Remains inpatient appropriate because: Acute stroke, requires rehab at [facility]       Subjective:   Interval events noted.  Patient is unable to provide an adequate history.  His brother, Rocky Link, was at the bedside and reported improvement in his condition.  However, he was concerned patient's poor oral intake which has been the case for some time now  Objective:    Vitals:   04/20/22 2108 04/21/22 0023 04/21/22 0433 04/21/22 0755  BP: 123/81 137/65 (!) 146/70 135/71  Pulse: 74 (!) 51 (!) 43 (!) 50  Resp: Temp: 97.7 F (36.5 C) 98.1 F (36.7 C) 97.8 F (36.6 C) 98 F (36.7 C)   TempSrc:    Oral  SpO2: 94% 96% 99% 96%  Weight:      Height:       No data found.   Intake/Output Summary (Last 24 hours) at 04/21/2022 1056 Last data filed at 04/21/2022 0853 Gross per 24 hour  Intake 441.34 ml  Output --  Net 441.34 ml   Filed Weights   04/20/22 1247  Weight: 63.5 kg    Exam:  GEN: NAD SKIN: No rash EYES: EOMI ENT: MMM CV: RRR PULM: CTA B ABD: soft, ND, NT, +BS CNS: AAO x 3, slurred speech, right upper extremity weakness (power-3/5) EXT: No edema or tenderness        Data Reviewed:   I have personally reviewed following labs and imaging studies:  Labs: Labs show the following:   Basic Metabolic Panel: Recent Labs  Lab 04/20/22 1253 04/20/22 1653  NA 138  --   K 3.2*  --   CL 102  --   CO2 28  --   GLUCOSE 93  --   BUN 26*  --   CREATININE 1.33*  --   CALCIUM 8.9  --   MG  --  1.0*   GFR Estimated Creatinine Clearance: 37.2 mL/min (A) (by C-G formula based on SCr of 1.33 mg/dL (H)). Liver Function Tests: Recent Labs  Lab 04/20/22 1253  AST 28  ALT 12  ALKPHOS 61  BILITOT 1.6*  PROT 6.3*  ALBUMIN 3.4*   No results for input(s): LIPASE, AMYLASE in the last 168 hours. No results for input(s): AMMONIA in the last 168 hours. Coagulation profile No results for input(s): INR, PROTIME in the last 168 hours.  CBC: Recent Labs  Lab 04/20/22 1253  WBC 6.6  NEUTROABS 4.5  HGB 13.6  HCT 40.3  MCV 108.6*  PLT 198   Cardiac Enzymes: No results for input(s): CKTOTAL, CKMB, CKMBINDEX, TROPONINI in the last 168 hours. BNP (last 3 results) No results for input(s): PROBNP in the last 8760 hours. CBG: No results for input(s): GLUCAP in the last 168 hours. D-Dimer: No results for input(s): DDIMER in the last 72 hours. Hgb A1c: Recent Labs    04/20/22 1653  HGBA1C 5.4   Lipid Profile: Recent Labs    04/21/22 0528  CHOL 143  HDL 70  LDLCALC 59  TRIG 72  CHOLHDL 2.0   Thyroid function studies: No results for  input(s): TSH, T4TOTAL, T3FREE, THYROIDAB in the last 72 hours.  Invalid input(s): FREET3 Anemia work up: No results for input(s): VITAMINB12, FOLATE, FERRITIN, TIBC, IRON, RETICCTPCT in the last 72 hours. Sepsis Labs: Recent Labs  Lab 04/20/22 1253  WBC 6.6    Microbiology No results found for this or any previous visit (from the past 240 hour(s)).  Procedures and diagnostic studies:  MR BRAIN WO CONTRAST  Result Date: 04/20/2022 CLINICAL DATA:  Stroke suspected EXAM: MRI HEAD WITHOUT CONTRAST TECHNIQUE: Multiplanar, multiecho pulse sequences of the brain and surrounding structures were obtained without intravenous contrast. COMPARISON:  10/16/2021 MRI correlation is also made with CT head 04/20/2022 FINDINGS: Brain: Area of restricted diffusion with ADC correlate in the posterior left frontal cortex and subcortical white matter measures approximately 1.6 x 2.4 x 3.2 cm (AP x TR x CC) (series 5, image 32 and series 7, image 20). Smaller areas of cortical and subcortical restricted diffusion are also noted in the medial right parietal lobe (series 5, images 28-32). Additional punctate right frontal lobe white matter restricted diffusion (series 5, image 34). These likely represent acute infarcts. Areas with slightly increased signal on diffusion-weighted imaging without strong ADC correlates are noted in the bilateral cerebellar hemispheres (series 5, image 9), likely subacute infarcts. No acute hemorrhage, mass, mass effect, or midline shift. Scattered foci of hemosiderin deposition in the bilateral temporal and occipital lobes, which may represent the sequela of prior hypertensive microhemorrhages. T2 hyperintense signal in the periventricular white matter, likely the sequela of chronic small vessel ischemic disease. Lacunar infarcts in the bilateral corona radiata and cerebellar hemispheres global cerebral volume loss. Vascular: Normal arterial flow voids. Skull and upper cervical spine: Normal  marrow signal. Sinuses/Orbits: Mucosal thickening in the ethmoid air cells. The orbits are unremarkable. Other: The mastoids are well aerated. IMPRESSION: Acute infarcts in the posterior left frontal cortex and subcortical white matter, medial right parietal cortex and subcortical white matter, and right frontal white matter, with additional small subacute infarcts in the bilateral cerebellar hemispheres. No hemorrhage, mass effect, or midline shift. These results will be called to the ordering clinician or representative by the Radiologist Assistant, and communication documented in the PACS or [employer]. Electronically Signed   By: Wiliam Ke M.D.   On: 04/20/2022 22:25   CT HEAD CODE STROKE WO CONTRAST`  Result Date: 04/20/2022 CLINICAL DATA:  Code stroke.  Neuro deficit, acute, stroke suspected EXAM: CT HEAD WITHOUT CONTRAST TECHNIQUE: Contiguous axial images were obtained from the base of the skull through the vertex without intravenous contrast. RADIATION DOSE REDUCTION: This exam was performed according to the departmental dose-optimization program which includes automated exposure control, adjustment of the mA and/or kV according to patient size and/or use of iterative reconstruction technique. COMPARISON:  CT head October 23, 2021. FINDINGS: Brain: New/interval area of hypodensity within the right parietal lobe, suggestive of infarct. Otherwise, similar remote infarcts in the corona radiata and chronic microvascular ischemic disease. No evidence of acute hemorrhage, mass lesion, midline shift, hydrocephalus, or extra-axial fluid collection. Vascular: No hyperdense vessel identified. Skull: No acute fracture. Sinuses/Orbits: Clear sinuses.  No acute orbital findings. Other: No mastoid effusions. IMPRESSION: 1. New/interval area of hypodensity within the right parietal lobe, suggestive of infarct that is otherwise age indeterminate by CT. Consider MRI for more sensitive evaluation. 2. No acute  hemorrhage 3. Remote corona radiata infarcts and chronic microvascular ischemic disease Code stroke imaging results were communicated on 04/20/2022 at 3:01 pm to provider [facility] via secure text paging. Electronically Signed   By: Feliberto Harts M.D.   On: 04/20/2022 15:06   CT ANGIO HEAD NECK W WO CM W PERF (CODE STROKE)  Result Date: 04/20/2022 CLINICAL DATA:  Neuro deficit, acute, stroke suspected  isolated aphasia EXAM: CT ANGIOGRAPHY HEAD AND NECK CT PERFUSION BRAIN TECHNIQUE: Multidetector CT imaging of the head and neck was performed using the standard protocol during bolus administration of intravenous contrast. Multiplanar CT image reconstructions and MIPs were obtained to evaluate the vascular anatomy. Carotid stenosis measurements (when applicable) are obtained utilizing NASCET criteria, using the distal internal carotid diameter as the denominator. Multiphase CT imaging of the brain was performed following IV bolus contrast injection. Subsequent parametric perfusion maps were calculated using RAPID software. RADIATION DOSE REDUCTION: This exam was performed according to the departmental dose-optimization program which includes automated exposure control, adjustment of the mA and/or kV according to patient size and/or use of iterative reconstruction technique. CONTRAST:  100mL OMNIPAQUE IOHEXOL 350 MG/ML SOLN COMPARISON:  Same day CT head. FINDINGS: CTA NECK FINDINGS Aortic arch: Atherosclerosis of the aorta and great vessel origins. Great vessel origins are patent without significant stenosis. Right carotid system: Atherosclerosis involving the carotid bifurcation and proximal ICA without greater than 50% stenosis. Left carotid system: Extensive atherosclerosis at the carotid bifurcation with severe (approximately 70%) stenosis of the ICA origin. Some hypodensity in this region is favored to represent streak artifact. Vertebral arteries: Left dominant. Patent bilaterally. No evidence of significant  (greater than 50%) stenosis. Skeleton: Severe multilevel degenerative change of the cervical spine, including severe multilevel degenerative disc disease and severe multilevel facet/uncovertebral hypertrophy with varying degrees of neural foraminal stenosis. Other neck: No acute findings. Upper chest: Emphysema. No consolidation the visualized lung apices. Review of the MIP images confirms the above findings CTA HEAD FINDINGS Anterior circulation: Bilateral intracranial ICAs are patent with mild atherosclerotic narrowing. Bilateral MCAs and ACAs are patent without proximal hemodynamically significant stenosis. No aneurysm identified. Posterior circulation: Calcific atherosclerosis of bilateral proximal intradural vertebral arteries. Right intradural vertebral artery is non dominant and terminates as PICA. Moderate atherosclerotic stenosis of the proximal left intradural vertebral artery. Basilar artery and both posterior cerebral arteries are patent with mild atherosclerotic narrowing. Venous sinuses: Poorly assessed due to arterial timing. Anatomic variants: Detailed above. CT Brain Perfusion Findings: CBF (<30%) Volume: 0mL Perfusion (Tmax>6.0s) volume: 0mL Mismatch Volume: 0mL Infarction Location:None identified. IMPRESSION: CTA head: 1. No emergent large vessel occlusion. 2. Moderate proximal left intradural vertebral artery stenosis. CTA neck: 1. Extensive atherosclerosis at the left carotid bifurcation with severe (approximately 70%) stenosis of the left ICA origin. 2. Aortic Atherosclerosis (ICD10-I70.0) and Emphysema (ICD10-J43.9). CT perfusion: No evidence of penumbra or core infarct. Electronically Signed   By: Feliberto HartsFrederick S Jones M.D.   On: 04/20/2022 15:16               LOS: 1 day   Kerby Hockley  [facility]   Pager on [email or web address]. If 7PM-7AM, please contact night-coverage at [email or web address]     04/21/2022, 10:56 AM

## 2022-04-21 NOTE — Progress Notes (Deleted)
Patient has been discharged home.  Discharge papers given and explained to patient.  He verbalized understanding.  Meds and f/u appointment reviewed.  Rx sent electronically to the pharmacy.  Patient made aware.  

## 2022-04-21 NOTE — Evaluation (Signed)
Occupational Therapy Evaluation Patient Details Name: Richard Carter MRN: [id number] DOB: 04-06-1941 Today's Date: 04/21/2022   History of Present Illness Pt is an 81 y.o. male presenting to hospital 5/27 with dysarthria and expressive aphasia.  Imaging showing "Acute infarcts in the posterior left frontal cortex and subcortical white matter, medial right parietal cortex and subcortical white matter, and right frontal white matter, with additional small subacute infarcts in the bilateral cerebellar hemispheres".  Pt admitted with acute CVA, UTI, a-fib, and h/o medication non-compliance.  PMH includes alcohol abuse, dementia, htn, seizures, stroke (with L sided hemiparesis), a-fib, AKI, pubic ramus and acetabular fx 2020, and UTI.   Clinical Impression   Patient presenting with decreased Ind in self care, balance, functional mobility/transfers, endurance, and safety awareness. Patient's brother in the room and caregiver on phone to discuss pt's PLOF. Pt is able to ambulate with RW and perform his own self care and toileting needs. His caregiver(neighbor) assists with medication, meals, and IADLs.  Patient currently functioning at min -mod A for functional mobility and self care. OT set pt up with meal tray for breakfast with pt needing assistance to open containers. Patient will benefit from acute OT to increase overall independence in the areas of ADLs, functional mobility, and safety awareness in order to safely discharge to next venue of care.      Recommendations for follow up therapy are one component of a multi-disciplinary discharge planning process, led by the attending physician.  Recommendations may be updated based on patient status, additional functional criteria and insurance authorization.   Follow Up Recommendations  Skilled nursing-short term rehab (<3 hours/day)    Assistance Recommended at Discharge Frequent or constant Supervision/Assistance  Patient can return home with the  following A little help with walking and/or transfers;A lot of help with bathing/dressing/bathroom;Assistance with cooking/housework;Direct supervision/assist for medications management;Help with stairs or ramp for entrance;Assist for transportation;Direct supervision/assist for financial management    Functional Status Assessment  Patient has had a recent decline in their functional status and demonstrates the ability to make significant improvements in function in a reasonable and predictable amount of time.  Equipment Recommendations  Other (comment) (defer to next venue of care)       Precautions / Restrictions Precautions Precautions: Fall Precaution Comments: Seizure precautions Restrictions Weight Bearing Restrictions: No      Mobility Bed Mobility Overal bed mobility: Needs Assistance Bed Mobility: Supine to Sit, Sit to Supine     Supine to sit: Min assist Sit to supine: Min assist   General bed mobility comments: for B LEs    Transfers Overall transfer level: Needs assistance Equipment used: 1 person hand held assist Transfers: Sit to/from Stand Sit to Stand: Mod assist                  Balance Overall balance assessment: Needs assistance Sitting-balance support: No upper extremity supported, Feet supported Sitting balance-Leahy Scale: Good Sitting balance - Comments: steady sitting reaching within BOS   Standing balance support: Bilateral upper extremity supported, During functional activity Standing balance-Leahy Scale: Poor                             ADL either performed or assessed with clinical judgement   ADL Overall ADL's : Needs assistance/impaired Eating/Feeding: Set up;Cueing for safety Eating/Feeding Details (indicate cue type and reason): to open containers Grooming: Wash/dry hands;Wash/dry face;Set up;Supervision/safety  Vision Patient Visual Report: No change from  baseline              Pertinent Vitals/Pain Pain Assessment Pain Assessment: Faces Faces Pain Scale: No hurt     Hand Dominance Right   Extremity/Trunk Assessment Upper Extremity Assessment Upper Extremity Assessment: Generalized weakness   Lower Extremity Assessment Lower Extremity Assessment: Defer to PT evaluation   Cervical / Trunk Assessment Cervical / Trunk Assessment: Other exceptions Cervical / Trunk Exceptions: forward head/shoulders   Communication Communication Communication: Expressive difficulties   Cognition Arousal/Alertness: Awake/alert Behavior During Therapy: Flat affect Overall Cognitive Status: Difficult to assess                                 General Comments: Oriented to at least name/DOB, place, month/year, and general situation.  Generalized confusion noted during session.  Pt's speech very difficult to understand.                Home Living Family/patient expects to be discharged to:: Skilled nursing facility Living Arrangements: Alone (neighbor lives next door) Available Help at Discharge: Neighbor;Available PRN/intermittently Type of Home: Mobile home Home Access: Stairs to enter Entrance Stairs-Number of Steps: 2 Entrance Stairs-Rails: Right;Left Home Layout: One level     Bathroom Shower/Tub: Chief Strategy Officer: Standard     Home Equipment: Grab bars - tub/shower;Shower seat;Grab bars - toilet;Rolling Walker (2 wheels)          Prior Functioning/Environment Prior Level of Function : Needs assist             Mobility Comments: Modified independent ambulating with RW or holding onto walls when ambulating within home.  No recent falls reports ADLs Comments: Assist for medications and meals; gets assisted with bath every Sunday.        OT Problem List: Decreased strength;Decreased activity tolerance;Decreased safety awareness;Impaired balance (sitting and/or standing);Decreased knowledge  of use of DME or AE;Decreased knowledge of precautions;Decreased cognition;Decreased coordination;Cardiopulmonary status limiting activity      OT Treatment/Interventions: Self-care/ADL training;Balance training;Therapeutic exercise;Neuromuscular education;Therapeutic activities;DME and/or AE instruction;Patient/family education;Manual therapy;Energy conservation    OT Goals(Current goals can be found in the care plan section) Acute Rehab OT Goals Patient Stated Goal: to go to rehab OT Goal Formulation: With patient/family Time For Goal Achievement: 05/05/22 Potential to Achieve Goals: Good ADL Goals Pt Will Perform Grooming: standing;with supervision Pt Will Perform Lower Body Dressing: with supervision;sit to/from stand Pt Will Transfer to Toilet: with supervision;ambulating Pt Will Perform Toileting - Clothing Manipulation and hygiene: with supervision;sit to/from stand  OT Frequency: Min 2X/week       AM-PAC OT "6 Clicks" Daily Activity     Outcome Measure Help from another person eating meals?: A Little Help from another person taking care of personal grooming?: A Little Help from another person toileting, which includes using toliet, bedpan, or urinal?: A Lot Help from another person bathing (including washing, rinsing, drying)?: A Lot Help from another person to put on and taking off regular upper body clothing?: A Little Help from another person to put on and taking off regular lower body clothing?: A Lot 6 Click Score: 15   End of Session Nurse Communication: Mobility status  Activity Tolerance: Patient limited by fatigue Patient left: in bed;with call bell/phone within reach;with bed alarm set  OT Visit Diagnosis: Unsteadiness on feet (R26.81);Repeated falls (R29.6);Muscle weakness (generalized) (M62.81)  Time: [id number] OT Time Calculation (min): 20 min Charges:  OT General Charges $OT Visit: 1 Visit OT Evaluation $OT Eval Moderate Complexity: 1  Mod OT Treatments $Self Care/Home Management : 8-22 mins  Jackquline DenmarkKatie Tramel Westbrook, MS, OTR/L , CBIS ascom [phone]  04/21/22, 12:39 PM

## 2022-04-21 NOTE — Evaluation (Addendum)
Speech Language Pathology Evaluation Patient Details Name: Richard Carter MRN: [id number] DOB: 01-29-1941 Today's Date: 04/21/2022 Time: 1320-1340 SLP Time Calculation (min) (ACUTE ONLY): 20 min  Problem List:  Patient Active Problem List   Diagnosis Date Noted   Nicotine dependence    Elevated troponin    Acute CVA (cerebrovascular accident) (HCC) 05/07/2021   Non compliance w medication regimen 05/07/2021   Dementia (HCC) 05/07/2021   AF (paroxysmal atrial fibrillation) (HCC) 05/07/2021   HTN (hypertension) 11/29/2020   Alcohol abuse 11/29/2020   AKI (acute kidney injury) (HCC) 11/29/2020   Atrial flutter with rapid ventricular response (HCC) 11/29/2020   Seizures (HCC) 02/22/2020   Pubic ramus fracture (HCC) 07/29/2019   Acetabular fracture (HCC) 07/29/2019   HLD (hyperlipidemia) 07/29/2019   GERD (gastroesophageal reflux disease) 07/29/2019   UTI (urinary tract infection) 07/29/2019   Past Medical History:  Past Medical History:  Diagnosis Date   Alcohol abuse    Dementia (HCC)    GERD (gastroesophageal reflux disease)    HLD (hyperlipidemia)    HTN (hypertension)    Seizures (HCC)    Stroke [facility])    Past Surgical History:  Past Surgical History:  Procedure Laterality Date   skin cyst removal     HPI:  Per H&P "Richard Carter is a 81 y.o. male with medical history significant for dementia, history of CVA with left-sided hemiparesis, GERD, alcohol abuse, nicotine dependence, seizures who was brought into the ER by EMS for evaluation of mental status changes.  Per patient's caregiver his last known well was about 7.30 AM on the morning of admission when he administered patient's medications.  He noted that patient was lethargic and had slurred speech at about 11:30 AM on the day of admission and so he called EMS.  He states that patient drinks alcohol daily and smokes about 10 cigarettes a day.  He also states that he administers the patient's medications but can't  "force him" to take the meds.  Code stroke was called and patient's initial CT scan of the head showed new/interval area of hypodensity within the right parietal lobe, suggestive of infarct that is otherwise age indeterminate by CT. Consider MRI for more sensitive evaluation. No acute hemorrhage  Remote corona radiata infarcts and chronic microvascular ischemic disease.  Patient had a CT angiogram which showed extensive atherosclerosis at the left carotid bifurcation with severe (approximately 70%) stenosis of the left ICA origin. Aortic Atherosclerosis and Emphysema .  CT perfusion: No evidence of penumbra or core infarct.  I am unable to do a review of systems on this patient due to his dysarthria  Patient did not receive tPA since his last dose of Eliquis was on the morning of his admission."   Assessment / Plan / Recommendation Clinical Impression  Pt seen for speech/language evaluation. Pt alert and pleasant. Flat affect. Finishing items from lunch tray upon SLP entrance to room. Brother present. Brother denies pt with observed difficulty swallowing; however, pt with reduced PO intake.   Assessment completed via informal means and portions of Western Aphasia Battery Revised (Bedside Record Form). Pt presents with s/sx moderate-severe dysarthria c/b slow rate of speech, hoarse vocal quality, imprecise articulation, and reduced speech intelligibility (~50% to an unfamiliar listener). Pt with s/sx aphasia. Further assessment needed to determine subtype. Pt's verbal expression notable for mild anomia with semantic paraphasias. Pt with receptive language deficits affecting auditory comprehension for complex yes/no question, 2-step directives, and likely conversation.  Assessment limited due to visual deficits.  Glasses not present. Pt able to adequate see pictured stimuli for spontaneous speech sample. Cognitive assessment deferred given hx dementia.   Per brother pt's speech is improving; although, not at  pt's baseline.   Recommend continued SLP services acute and post-acute for functional communication. Anticipate need for frequent/constant supervision and assistance at d/c.   Pt and brother made aware of results, recommendations, and SLP POC. ?full understanding by pt. Brother verbalized understanding/agreement.    SLP Assessment  SLP Recommendation/Assessment: Patient needs continued Speech Lanaguage Pathology Services SLP Visit Diagnosis: Dysarthria and anarthria (R47.1);Aphasia (R47.01)    Recommendations for follow up therapy are one component of a multi-disciplinary discharge planning process, led by the attending physician.  Recommendations may be updated based on patient status, additional functional criteria and insurance authorization.    Follow Up Recommendations  Skilled nursing-short term rehab (<3 hours/day)    Assistance Recommended at Discharge  Frequent or constant Supervision/Assistance  Functional Status Assessment Patient has had a recent decline in their functional status and demonstrates the ability to make significant improvements in function in a reasonable and predictable amount of time.  Frequency and Duration min 2x/week  2 weeks      SLP Evaluation Cognition  Overall Cognitive Status: Difficult to assess       Comprehension  Auditory Comprehension Overall Auditory Comprehension: Impaired Yes/No Questions: Impaired Complex Questions: 75-100% accurate Commands: Impaired Complex Commands: 50-74% accurate Visual Recognition/Discrimination Discrimination: Not tested Reading Comprehension Reading Status: Not tested    Expression Expression Primary Mode of Expression: Verbal Verbal Expression Overall Verbal Expression: Impaired Initiation: Impaired Automatic Speech:  (WFL) Level of Generative/Spontaneous Verbalization: Sentence Repetition: No impairment Naming: Impairment Confrontation: Impaired Convergent: 75-100% accurate Verbal Errors:  Semantic paraphasias Pragmatics: Impairment Impairments: Abnormal affect;Topic maintenance Interfering Components: Speech intelligibility Written Expression Dominant Hand: Right Written Expression: Not tested   Oral / Motor  Motor Speech Overall Motor Speech: Impaired Respiration: Impaired Level of Impairment: Phrase Phonation: Hoarse Resonance: Within functional limits Articulation: Impaired Intelligibility: Intelligibility reduced Word: 50-74% accurate Motor Planning: Witnin functional limits Effective Techniques: Slow rate;Increased vocal intensity;Over-articulate           Clyde Canterbury, M.S., CCC-SLP Speech-Language Pathologist [facility] [phone] (ASCOM)  Woodroe Chen 04/21/2022, 3:08 PM

## 2022-04-21 NOTE — NC FL2 (Signed)
[place] MEDICAID FL2 LEVEL OF CARE SCREENING TOOL     IDENTIFICATION  Patient Name: Richard Carter Birthdate: Jul 11, 1941 Sex: male Admission Date (Current Location): 04/20/2022  [facility] and [place] Number:  Chiropodist and Address:  [facility], [address], [place], [place] [address]      Provider Number: [id number]  Attending Physician Name and Address:  Lurene Shadow, MD  Relative Name and Phone Number:  Levin Bacon [phone]     CARE GIVER    Current Level of Care: Hospital Recommended Level of Care: Skilled Nursing Facility Prior Approval Number:    Date Approved/Denied:   PASRR Number: [id number]  Discharge Plan:      Current Diagnoses: Patient Active Problem List   Diagnosis Date Noted   Nicotine dependence    Elevated troponin    Acute CVA (cerebrovascular accident) (HCC) 05/07/2021   Non compliance w medication regimen 05/07/2021   Dementia (HCC) 05/07/2021   AF (paroxysmal atrial fibrillation) (HCC) 05/07/2021   HTN (hypertension) 11/29/2020   Alcohol abuse 11/29/2020   AKI (acute kidney injury) (HCC) 11/29/2020   Atrial flutter with rapid ventricular response (HCC) 11/29/2020   Seizures (HCC) 02/22/2020   Pubic ramus fracture (HCC) 07/29/2019   Acetabular fracture (HCC) 07/29/2019   HLD (hyperlipidemia) 07/29/2019   GERD (gastroesophageal reflux disease) 07/29/2019   UTI (urinary tract infection) 07/29/2019    Orientation RESPIRATION BLADDER Height & Weight     Self, Time, Situation  Normal Incontinent Weight: 139 lb 15.9 oz (63.5 kg) Height:  5\' 5"  (165.1 cm)  BEHAVIORAL SYMPTOMS/MOOD NEUROLOGICAL BOWEL NUTRITION STATUS      Incontinent Diet (dys 3)  AMBULATORY STATUS COMMUNICATION OF NEEDS Skin   Limited Assist Verbally Skin abrasions, Bruising                       Personal Care Assistance Level of Assistance  Bathing, Feeding, Dressing Bathing Assistance: Limited  assistance Feeding assistance: Limited assistance Dressing Assistance: Limited assistance     Functional Limitations Info             SPECIAL CARE FACTORS FREQUENCY  PT (By licensed PT), OT (By licensed OT)     PT Frequency: 5 times per week OT Frequency: 5 times per week            Contractures      Additional Factors Info  Code Status, Allergies Code Status Info: DNR Allergies Info: nka           Current Medications (04/21/2022):  This is the current hospital active medication list Current Facility-Administered Medications  Medication Dose Carter Frequency Provider Last Rate Last Admin   0.9 % NaCl with KCl 40 mEq / L  infusion   Intravenous Continuous 04/23/2022, DO 50 mL/hr at 04/21/22 0853 Infusion Verify at 04/21/22 04/23/22   acetaminophen (TYLENOL) tablet 650 mg  650 mg Oral Q4H PRN Agbata, Tochukwu, MD       Or   acetaminophen (TYLENOL) 160 MG/5ML solution 650 mg  650 mg Per Tube Q4H PRN Agbata, Tochukwu, MD       Or   acetaminophen (TYLENOL) suppository 650 mg  650 mg Rectal Q4H PRN Agbata, Tochukwu, MD       aspirin chewable tablet 81 mg  81 mg Oral Daily [id number], MD   81 mg at 04/21/22 1101   atorvastatin (LIPITOR) tablet 80 mg  80 mg Oral Daily Agbata, Tochukwu, MD  80 mg at 04/21/22 1100   cefTRIAXone (ROCEPHIN) 1 g in sodium chloride 0.9 % 100 mL IVPB  1 g Intravenous Q24H Agbata, Tochukwu, MD   Stopped at 04/20/22 2008   chlordiazePOXIDE (LIBRIUM) capsule 25 mg  25 mg Oral QID Lurene Shadow, MD   25 mg at 04/21/22 1101   folic acid (FOLVITE) tablet 1 mg  1 mg Oral Daily Agbata, Tochukwu, MD   1 mg at 04/21/22 1100   levETIRAcetam (KEPPRA) tablet 500 mg  500 mg Oral BID Agbata, Tochukwu, MD   500 mg at 04/21/22 1101   LORazepam (ATIVAN) tablet 1-4 mg  1-4 mg Oral Q1H PRN Agbata, Tochukwu, MD       Or   LORazepam (ATIVAN) injection 1-4 mg  1-4 mg Intravenous Q1H PRN Agbata, Tochukwu, MD       magnesium oxide (MAG-OX) tablet 400 mg  400 mg Oral  Daily Agbata, Tochukwu, MD   400 mg at 04/21/22 1101   metoprolol succinate (TOPROL-XL) 24 hr tablet 12.5 mg  12.5 mg Oral Daily Hall, Carole N, DO   12.5 mg at 04/21/22 1101   metoprolol tartrate (LOPRESSOR) injection 2.5 mg  2.5 mg Intravenous Q6H PRN Dow Adolph N, DO   2.5 mg at 04/20/22 2051   multivitamin with minerals tablet 1 tablet  1 tablet Oral Daily Agbata, Tochukwu, MD   1 tablet at 04/21/22 1100   nicotine (NICODERM CQ - dosed in mg/24 hours) patch 14 mg  14 mg Transdermal Daily Agbata, Tochukwu, MD   14 mg at 04/21/22 1110   pantoprazole (PROTONIX) EC tablet 40 mg  40 mg Oral Daily Agbata, Tochukwu, MD   40 mg at 04/21/22 1101   thiamine tablet 100 mg  100 mg Oral Daily Agbata, Tochukwu, MD   100 mg at 04/21/22 1101   Or   thiamine (B-1) injection 100 mg  100 mg Intravenous Daily Agbata, Tochukwu, MD       traZODone (DESYREL) tablet 100 mg  100 mg Oral QHS Agbata, Tochukwu, MD   100 mg at 04/20/22 2054     Discharge Medications: Please see discharge summary for a list of discharge medications.  Relevant Imaging Results:  Relevant Lab Results:   Additional Information SS #: [id number]  Chrystal Zeimet E Roschelle Calandra, LCSW

## 2022-04-21 NOTE — Plan of Care (Signed)
Neurology plan of care  MRI brain confirmed acute infarcts in the posterior left frontal cortex and subcortical white matter, medial right parietal cortex and subcortical white matter, and right frontal white matter, with additional small subacute infarcts in the bilateral cerebellar hemispheres. Suspect cardioembolic source in the setting of questionable compliance with eliquis for a fib. Patient is awaiting TTE. If no intracardiac clot is visualized on TTE, recommend holding eliquis for 5 days total (restart 6/1). He has been placed on ASA 81mg  daily while off eliquis, this may be discontinued when eliquis is restarted. Neurology will f/u on TTE results and will otherwise be available for questions prn going forward.  , MD [facility] [phone]  If 7pm- 7am, please page neurology on call as listed in AMION.

## 2022-04-21 NOTE — TOC Initial Note (Signed)
Transition of Care [facility]) - Initial/Assessment Note    Patient Details  Name: Richard Carter MRN: [id number] Date of Birth: 10-Jan-1941  Transition of Care [facility]) CM/SW Contact:    Liliana Cline, LCSW Phone Number: 04/21/2022, 1:56 PM  Clinical Narrative:                Per chart review patient has confusion. CSW called patient's son Dorinda Hill. Dorinda Hill stated they are agreeable to SNF and prefer [facility] as patient has been there in the past. Dorinda Hill requested CSW call patient's caregiver/neighbor Renaldo Reel for assessment. CSW called [name]. Patient lives alone, however [name] is his neighbor and caregiver who checks on him. PCP is Dr. Burnadette Pop. [facility]acy [facility]. Patient has a RW and shower seat at home. Patient had [facility] in the past. [name] is also agreeable to SNF and confirmed they prefer [facility]. SNF work up started.    Expected Discharge Plan: Skilled Nursing Facility Barriers to Discharge: Continued Medical Work up   Patient Goals and CMS Choice Patient states their goals for this hospitalization and ongoing recovery are:: SNF CMS Medicare.gov Compare Post Acute Care list provided to:: Patient Represenative (must comment) Choice offered to / list presented to : Adult Children  Expected Discharge Plan and Services Expected Discharge Plan: Skilled Nursing Facility       Living arrangements for the past 2 months: Single Family Home                                      Prior Living Arrangements/Services Living arrangements for the past 2 months: Single Family Home Lives with:: Self Patient language and need for interpreter reviewed:: Yes Do you feel safe going back to the place where you live?: Yes      Need for Family Participation in Patient Care: Yes (Comment) Care giver support system in place?: Yes (comment) Current home services: DME Criminal Activity/Legal Involvement Pertinent to Current  Situation/Hospitalization: No - Comment as needed  Activities of Daily Living Home Assistive Devices/Equipment: Eyeglasses, Environmental consultant (specify type) (Front wheels walker) ADL Screening (condition at time of admission) Patient's cognitive ability adequate to safely complete daily activities?: No Is the patient deaf or have difficulty hearing?: No Does the patient have difficulty seeing, even when wearing glasses/contacts?: No Does the patient have difficulty concentrating, remembering, or making decisions?: Yes Patient able to express need for assistance with ADLs?: Yes Does the patient have difficulty dressing or bathing?: Yes Independently performs ADLs?: No Communication: Needs assistance Is this a change from baseline?: Pre-admission baseline Dressing (OT): Needs assistance Is this a change from baseline?: Pre-admission baseline Grooming: Needs assistance Is this a change from baseline?: Pre-admission baseline Feeding: Independent Bathing: Needs assistance Is this a change from baseline?: Pre-admission baseline Toileting: Needs assistance Is this a change from baseline?: Change from baseline, expected to last <3 days In/Out Bed: Needs assistance Walks in Home: Independent with device (comment) Does the patient have difficulty walking or climbing stairs?: Yes Weakness of Legs: Both Weakness of Arms/Hands: Both  Permission Sought/Granted Permission sought to share information with : Magazine features editor, Other (comment) Permission granted to share information with : Yes, Verbal Permission Granted (by son Dorinda Hill)     Permission granted to share info w AGENCY: SNFs  Permission granted to share info w Relationship: neighbor/caregiver Renaldo Reel     Emotional Assessment  Alcohol / Substance Use: Not Applicable Psych Involvement: No (comment)  Admission diagnosis:  Aphasia [R47.01] Acute CVA (cerebrovascular accident) [facility]) [I63.9] Patient Active Problem  List   Diagnosis Date Noted   Nicotine dependence    Elevated troponin    Acute CVA (cerebrovascular accident) (HCC) 05/07/2021   Non compliance w medication regimen 05/07/2021   Dementia (HCC) 05/07/2021   AF (paroxysmal atrial fibrillation) (HCC) 05/07/2021   HTN (hypertension) 11/29/2020   Alcohol abuse 11/29/2020   AKI (acute kidney injury) (HCC) 11/29/2020   Atrial flutter with rapid ventricular response (HCC) 11/29/2020   Seizures (HCC) 02/22/2020   Pubic ramus fracture (HCC) 07/29/2019   Acetabular fracture (HCC) 07/29/2019   HLD (hyperlipidemia) 07/29/2019   GERD (gastroesophageal reflux disease) 07/29/2019   UTI (urinary tract infection) 07/29/2019   PCP:  Marisue Ivan, MD Pharmacy:   [facility] [address]), [place] - [address] [place]) [place] [address] Phone: [phone] Fax: [phone]     Social Determinants of Health (SDOH) Interventions    Readmission Risk Interventions    04/21/2022    1:55 PM  Readmission Risk Prevention Plan  Post Dischage Appt Complete  Medication Screening Complete  Transportation Screening Complete

## 2022-04-21 NOTE — TOC Progression Note (Signed)
Transition of Care [facility]) - Progression Note    Patient Details  Name: Richard Carter MRN: [id number] Date of Birth: 04/29/1941  Transition of Care [facility]) CM/SW Contact  Liliana Cline, LCSW Phone Number: 04/21/2022, 10:43 AM  Clinical Narrative:    TOC consulted for short term rehab. TOC will follow for PT and OT recommendations.        Expected Discharge Plan and Services                                                 Social Determinants of Health (SDOH) Interventions    Readmission Risk Interventions     View : No data to display.

## 2022-04-22 ENCOUNTER — Inpatient Hospital Stay
Admit: 2022-04-22 | Discharge: 2022-04-22 | Disposition: A | Discharge status: Home / Self Care | Payer: Medicare HMO | Attending: Internal Medicine | Admitting: Internal Medicine

## 2022-04-22 DIAGNOSIS — I639 Cerebral infarction, unspecified: Secondary | ICD-10-CM | POA: Diagnosis not present

## 2022-04-22 LAB — BASIC METABOLIC PANEL
Anion gap: 9 (ref 5–15)
BUN: 33 mg/dL — ABNORMAL HIGH (ref 8–23)
CO2: 27 mmol/L (ref 22–32)
Calcium: 9.5 mg/dL (ref 8.9–10.3)
Chloride: 101 mmol/L (ref 98–111)
Creatinine, Ser: 1.21 mg/dL (ref 0.61–1.24)
GFR, Estimated: 60 mL/min — ABNORMAL LOW (ref >60)
Glucose, Bld: 109 mg/dL — ABNORMAL HIGH (ref 70–99)
Potassium: 4.6 mmol/L (ref 3.5–5.1)
Sodium: 137 mmol/L (ref 135–145)

## 2022-04-22 LAB — ECHOCARDIOGRAM COMPLETE
Area-P 1/2: 3.31 cm2
Height: 65 in
MV VTI: 2.02 cm2
S' Lateral: 2.18 cm
Weight: 2239.87 oz

## 2022-04-22 LAB — PHOSPHORUS: Phosphorus: 2.6 mg/dL (ref 2.5–4.6)

## 2022-04-22 LAB — MAGNESIUM: Magnesium: 1.9 mg/dL (ref 1.7–2.4)

## 2022-04-22 MED ORDER — LEVETIRACETAM IN NACL 500 MG/100ML IV SOLN
500.0000 mg | Freq: Two times a day (BID) | INTRAVENOUS | Status: DC
  Administered 2022-04-22 – 2022-04-28 (×13): 500 mg via INTRAVENOUS
  Filled 2022-04-22 (×13): qty 100

## 2022-04-22 MED ORDER — ONDANSETRON HCL 4 MG/2ML IJ SOLN
4.0000 mg | Freq: Once | INTRAMUSCULAR | Status: AC
  Administered 2022-04-23: 4 mg via INTRAVENOUS
  Filled 2022-04-22: qty 2

## 2022-04-22 MED ORDER — CHLORDIAZEPOXIDE HCL 25 MG PO CAPS: 25.0000 mg | ORAL_CAPSULE | Freq: Three times a day (TID) | ORAL | Status: DC

## 2022-04-22 NOTE — Progress Notes (Addendum)
SLP Cancellation Note  Patient Details Name: Richard Carter MRN: [id number] DOB: 07-06-1941   Cancelled treatment:       Reason Eval/Treat Not Completed: Fatigue/lethargy limiting ability to participate   Pt found sleeping in bed. Attempted to rouse pt for participation in speech/language tx with verbal/tactile cueing, pt roused briefly, but no long enough for meaninful participation in tx. Noted pt also with audible, wet respirations. RN made aware.   Clyde Canterbury, M.S., CCC-SLP Speech-Language Pathologist [facility] [phone] Arnette Felts)  Woodroe Chen 04/22/2022, 11:15 AM

## 2022-04-22 NOTE — Progress Notes (Signed)
Received order to obtain EKG. EKG reads sinus bradycardia with aberrant conduction.

## 2022-04-22 NOTE — Progress Notes (Signed)
Patient heavily sleeping and lethargic. HR stable- afib- and RR 25. MD notified this morning and re-notified at this time. Unable to give morning med pass.  Caregiver, [name], arrived to bedside and states ativan has caused this lethargy in the past.  Will continue to monitor.

## 2022-04-22 NOTE — Progress Notes (Addendum)
PT Cancellation Note  Patient Details Name: Richard Carter MRN: [id number] DOB: 09/03/1941   Cancelled Treatment:    Reason Eval/Treat Not Completed: Other (comment) Pt noted to be undergoing echocardiogram this AM & staff reported pt vomited during imaging. Will hold PT tx at this time & f/u as able.  Addendum: Attempted to see pt again but pt received in bed sleeping. Nurse reports pt is very lethargic after receiving ativan last night & pt is not appropriate for PT today. Will f/u tomorrow.   Aleda Grana, PT, DPT 04/22/22, 12:45 PM   Sandi Mariscal 04/22/2022, 10:09 AM

## 2022-04-22 NOTE — Progress Notes (Signed)
SLP Cancellation Note  Patient Details Name: Richard Carter MRN: [id number] DOB: [birth year]   Cancelled treatment:       Reason Eval/Treat Not Completed: Patient at procedure or test/unavailable   Speech/language tx deferred at this time as pt undergoing echocardiogram. SLP to continue efforts as appropriate.  Cherrie Gauze, M.S., [facility] [phone] [name])  Quintella Baton 04/22/2022, 9:35 AM

## 2022-04-22 NOTE — Progress Notes (Signed)
Progress Note    Richard Carter  [id number] DOB: December 11, 1940  DOA: 04/20/2022 PCP: Dion Body, MD      Brief Narrative:    Medical records reviewed and are as summarized below:  Richard Carter is a 81 y.o. male with medical history significant for dementia, history of stroke with left-sided hemiparesis, seizure disorder, hypertension, GERD, alcohol use disorder, tobacco use disorder, who was brought to the hospital for evaluation of altered mental status (lethargy) and difficulty speaking.     Assessment/Plan:   Principal Problem:   Acute CVA (cerebrovascular accident) ([place]) Active Problems:   UTI (urinary tract infection)   AF (paroxysmal atrial fibrillation) (HCC)   Non compliance w medication regimen   Seizures (HCC)   HTN (hypertension)   Dementia (HCC)   Alcohol abuse   Nicotine dependence   Elevated troponin    Body mass index is 23.3 kg/m.  Acute stroke, suspect embolic stroke, severe left ICA stenosis (70%): Continue aspirin and Lipitor and replace aspirin with Eliquis after 5 days of aspirin.  PT and OT evaluation.  2D echo is pending.  Outpatient follow-up with vascular surgeon.  Paroxysmal atrial fibrillation: Eliquis on hold.  Continue metoprolol  Dehydration and poor oral intake: Discontinue IV fluids.  Abnormal urinalysis, suspected UTI: Urine culture showed E. coli.  Sensitivity report is pending.  Continue IV Rocephin.  Altered mental status/acute toxic encephalopathy: Probably due to benzodiazepine use.  Discontinue Librium.    Hypokalemia and hypomagnesemia: Improved  Alcohol use disorder: Use Ativan only as needed per CIWA protocol.    Hypertension: Resume amlodipine and losartan tomorrow  Other comorbidities include seizure disorder, dementia, tobacco use disorder, medical nonadherence    Diet Order             DIET DYS 3 Room service appropriate? Yes; Fluid consistency: Thin  Diet effective now                             Consultants: Neurologist  Procedures: None    Medications:    aspirin  81 mg Oral Daily   atorvastatin  80 mg Oral Daily   folic acid  1 mg Oral Daily   magnesium oxide  400 mg Oral Daily   metoprolol succinate  12.5 mg Oral Daily   multivitamin with minerals  1 tablet Oral Daily   nicotine  14 mg Transdermal Daily   pantoprazole  40 mg Oral Daily   thiamine  100 mg Oral Daily   Or   thiamine  100 mg Intravenous Daily   traZODone  100 mg Oral QHS   Continuous Infusions:  cefTRIAXone (ROCEPHIN)  IV Stopped (04/21/22 1703)   levETIRAcetam 500 mg (04/22/22 1316)     Anti-infectives (From admission, onward)    Start     Dose/Rate Route Frequency Ordered Stop   04/20/22 1700  cefTRIAXone (ROCEPHIN) 1 g in sodium chloride 0.9 % 100 mL IVPB        1 g 200 mL/hr over 30 Minutes Intravenous Every 24 hours 04/20/22 1636 04/23/22 1659              Family Communication/Anticipated D/C date and plan/Code Status   DVT prophylaxis: SCD's Start: 04/20/22 1549     Code Status: DNR  Family Communication:None Disposition Plan: Plan to discharge to SNF   Status is: Inpatient Remains inpatient appropriate because: Acute stroke, requires rehab at [facility]  Subjective:   Interval events noted.  Patient has been very lethargic and difficult to arouse.  Objective:    Vitals:   04/22/22 0448 04/22/22 0546 04/22/22 0816 04/22/22 1235  BP: (!) 183/82 (!) 168/84 (!) 187/88 (!) 171/101  Pulse: (!) 58 (!) 56 (!) 52 (!) 56  Resp: 18 18 16 18   Temp: 98.1 F (36.7 C) 97.9 F (36.6 C)  (!) 97.5 F (36.4 C)  TempSrc:      SpO2: 99% 100% 95% 98%  Weight:      Height:       No data found.   Intake/Output Summary (Last 24 hours) at 04/22/2022 1346 Last data filed at 04/22/2022 1025 Gross per 24 hour  Intake 499.15 ml  Output 1051 ml  Net -551.85 ml   Filed Weights   04/20/22 1247  Weight: 63.5 kg    Exam:  GEN: NAD SKIN: Warm and  dry EYES: EOMI ENT: MMM CV: RRR PULM: CTA B ABD: soft, ND, NT, +BS CNS: Sleepy, lethargic, unable to follow commands EXT: No edema          Data Reviewed:   I have personally reviewed following labs and imaging studies:  Labs: Labs show the following:   Basic Metabolic Panel: Recent Labs  Lab 04/20/22 1253 04/20/22 1653 04/21/22 1113 04/22/22 0356  NA 138  --  138 137  K 3.2*  --  3.2* 4.6  CL 102  --  103 101  CO2 28  --  27 27  GLUCOSE 93  --  181* 109*  BUN 26*  --  26* 33*  CREATININE 1.33*  --  1.36* 1.21  CALCIUM 8.9  --  9.5 9.5  MG  --  1.0* 2.2 1.9  PHOS  --   --  2.9 2.6   GFR Estimated Creatinine Clearance: 40.9 mL/min (by C-G formula based on SCr of 1.21 mg/dL). Liver Function Tests: Recent Labs  Lab 04/20/22 1253  AST 28  ALT 12  ALKPHOS 61  BILITOT 1.6*  PROT 6.3*  ALBUMIN 3.4*   No results for input(s): LIPASE, AMYLASE in the last 168 hours. No results for input(s): AMMONIA in the last 168 hours. Coagulation profile No results for input(s): INR, PROTIME in the last 168 hours.  CBC: Recent Labs  Lab 04/20/22 1253  WBC 6.6  NEUTROABS 4.5  HGB 13.6  HCT 40.3  MCV 108.6*  PLT 198   Cardiac Enzymes: No results for input(s): CKTOTAL, CKMB, CKMBINDEX, TROPONINI in the last 168 hours. BNP (last 3 results) No results for input(s): PROBNP in the last 8760 hours. CBG: No results for input(s): GLUCAP in the last 168 hours. D-Dimer: No results for input(s): DDIMER in the last 72 hours. Hgb A1c: Recent Labs    04/20/22 1653  HGBA1C 5.4   Lipid Profile: Recent Labs    04/21/22 0528  CHOL 143  HDL 70  LDLCALC 59  TRIG 72  CHOLHDL 2.0   Thyroid function studies: No results for input(s): TSH, T4TOTAL, T3FREE, THYROIDAB in the last 72 hours.  Invalid input(s): FREET3 Anemia work up: No results for input(s): VITAMINB12, FOLATE, FERRITIN, TIBC, IRON, RETICCTPCT in the last 72 hours. Sepsis Labs: Recent Labs  Lab  04/20/22 1253  WBC 6.6    Microbiology Recent Results (from the past 240 hour(s))  Urine Culture     Status: Abnormal (Preliminary result)   Collection Time: 04/20/22  3:44 PM   Specimen: Urine, Clean Catch  Result Value Ref Range  Status   Specimen Description   Final    URINE, CLEAN CATCH Performed at [facility], [address] [place], [place] [address]    Special Requests   Final    NONE Performed at [facility], [address] [place], [place] [address]    Culture >=100,000 COLONIES/mL ESCHERICHIA COLI (A)  Final   Report Status PENDING  Incomplete    Procedures and diagnostic studies:  MR BRAIN WO CONTRAST  Result Date: 04/20/2022 CLINICAL DATA:  Stroke suspected EXAM: MRI HEAD WITHOUT CONTRAST TECHNIQUE: Multiplanar, multiecho pulse sequences of the brain and surrounding structures were obtained without intravenous contrast. COMPARISON:  10/16/2021 MRI correlation is also made with CT head 04/20/2022 FINDINGS: Brain: Area of restricted diffusion with ADC correlate in the posterior left frontal cortex and subcortical white matter measures approximately 1.6 x 2.4 x 3.2 cm (AP x TR x CC) (series 5, image 32 and series 7, image 20). Smaller areas of cortical and subcortical restricted diffusion are also noted in the medial right parietal lobe (series 5, images 28-32). Additional punctate right frontal lobe white matter restricted diffusion (series 5, image 34). These likely represent acute infarcts. Areas with slightly increased signal on diffusion-weighted imaging without strong ADC correlates are noted in the bilateral cerebellar hemispheres (series 5, image 9), likely subacute infarcts. No acute hemorrhage, mass, mass effect, or midline shift. Scattered foci of hemosiderin deposition in the bilateral temporal and occipital lobes, which may represent the sequela of prior hypertensive microhemorrhages. T2 hyperintense signal in the periventricular white matter,  likely the sequela of chronic small vessel ischemic disease. Lacunar infarcts in the bilateral corona radiata and cerebellar hemispheres global cerebral volume loss. Vascular: Normal arterial flow voids. Skull and upper cervical spine: Normal marrow signal. Sinuses/Orbits: Mucosal thickening in the ethmoid air cells. The orbits are unremarkable. Other: The mastoids are well aerated. IMPRESSION: Acute infarcts in the posterior left frontal cortex and subcortical white matter, medial right parietal cortex and subcortical white matter, and right frontal white matter, with additional small subacute infarcts in the bilateral cerebellar hemispheres. No hemorrhage, mass effect, or midline shift. These results will be called to the ordering clinician or representative by the Radiologist Assistant, and communication documented in the PACS or [employer]. Electronically Signed   By: Merilyn Baba M.D.   On: 04/20/2022 22:25   CT HEAD CODE STROKE WO CONTRAST`  Result Date: 04/20/2022 CLINICAL DATA:  Code stroke.  Neuro deficit, acute, stroke suspected EXAM: CT HEAD WITHOUT CONTRAST TECHNIQUE: Contiguous axial images were obtained from the base of the skull through the vertex without intravenous contrast. RADIATION DOSE REDUCTION: This exam was performed according to the departmental dose-optimization program which includes automated exposure control, adjustment of the mA and/or kV according to patient size and/or use of iterative reconstruction technique. COMPARISON:  CT head October 23, 2021. FINDINGS: Brain: New/interval area of hypodensity within the right parietal lobe, suggestive of infarct. Otherwise, similar remote infarcts in the corona radiata and chronic microvascular ischemic disease. No evidence of acute hemorrhage, mass lesion, midline shift, hydrocephalus, or extra-axial fluid collection. Vascular: No hyperdense vessel identified. Skull: No acute fracture. Sinuses/Orbits: Clear sinuses.  No acute  orbital findings. Other: No mastoid effusions. IMPRESSION: 1. New/interval area of hypodensity within the right parietal lobe, suggestive of infarct that is otherwise age indeterminate by CT. Consider MRI for more sensitive evaluation. 2. No acute hemorrhage 3. Remote corona radiata infarcts and chronic microvascular ischemic disease Code stroke imaging results were communicated on  04/20/2022 at 3:01 pm to provider [facility] via secure text paging. Electronically Signed   By: Margaretha Sheffield M.D.   On: 04/20/2022 15:06   CT ANGIO HEAD NECK W WO CM W PERF (CODE STROKE)  Result Date: 04/20/2022 CLINICAL DATA:  Neuro deficit, acute, stroke suspected isolated aphasia EXAM: CT ANGIOGRAPHY HEAD AND NECK CT PERFUSION BRAIN TECHNIQUE: Multidetector CT imaging of the head and neck was performed using the standard protocol during bolus administration of intravenous contrast. Multiplanar CT image reconstructions and MIPs were obtained to evaluate the vascular anatomy. Carotid stenosis measurements (when applicable) are obtained utilizing NASCET criteria, using the distal internal carotid diameter as the denominator. Multiphase CT imaging of the brain was performed following IV bolus contrast injection. Subsequent parametric perfusion maps were calculated using RAPID software. RADIATION DOSE REDUCTION: This exam was performed according to the departmental dose-optimization program which includes automated exposure control, adjustment of the mA and/or kV according to patient size and/or use of iterative reconstruction technique. CONTRAST:  158mL OMNIPAQUE IOHEXOL 350 MG/ML SOLN COMPARISON:  Same day CT head. FINDINGS: CTA NECK FINDINGS Aortic arch: Atherosclerosis of the aorta and great vessel origins. Great vessel origins are patent without significant stenosis. Right carotid system: Atherosclerosis involving the carotid bifurcation and proximal ICA without greater than 50% stenosis. Left carotid system: Extensive  atherosclerosis at the carotid bifurcation with severe (approximately 70%) stenosis of the ICA origin. Some hypodensity in this region is favored to represent streak artifact. Vertebral arteries: Left dominant. Patent bilaterally. No evidence of significant (greater than 50%) stenosis. Skeleton: Severe multilevel degenerative change of the cervical spine, including severe multilevel degenerative disc disease and severe multilevel facet/uncovertebral hypertrophy with varying degrees of neural foraminal stenosis. Other neck: No acute findings. Upper chest: Emphysema. No consolidation the visualized lung apices. Review of the MIP images confirms the above findings CTA HEAD FINDINGS Anterior circulation: Bilateral intracranial ICAs are patent with mild atherosclerotic narrowing. Bilateral MCAs and ACAs are patent without proximal hemodynamically significant stenosis. No aneurysm identified. Posterior circulation: Calcific atherosclerosis of bilateral proximal intradural vertebral arteries. Right intradural vertebral artery is non dominant and terminates as PICA. Moderate atherosclerotic stenosis of the proximal left intradural vertebral artery. Basilar artery and both posterior cerebral arteries are patent with mild atherosclerotic narrowing. Venous sinuses: Poorly assessed due to arterial timing. Anatomic variants: Detailed above. CT Brain Perfusion Findings: CBF (<30%) Volume: 30mL Perfusion (Tmax>6.0s) volume: 22mL Mismatch Volume: 49mL Infarction Location:None identified. IMPRESSION: CTA head: 1. No emergent large vessel occlusion. 2. Moderate proximal left intradural vertebral artery stenosis. CTA neck: 1. Extensive atherosclerosis at the left carotid bifurcation with severe (approximately 70%) stenosis of the left ICA origin. 2. Aortic Atherosclerosis (ICD10-I70.0) and Emphysema (ICD10-J43.9). CT perfusion: No evidence of penumbra or core infarct. Electronically Signed   By: Margaretha Sheffield M.D.   On: 04/20/2022  15:16               LOS: 2 days   Leopold Smyers  [facility]   Pager on [email or web address]. If 7PM-7AM, please contact night-coverage at [email or web address]     04/22/2022, 1:46 PM

## 2022-04-23 ENCOUNTER — Inpatient Hospital Stay: Payer: Medicare HMO

## 2022-04-23 DIAGNOSIS — J690 Pneumonitis due to inhalation of food and vomit: Secondary | ICD-10-CM | POA: Diagnosis not present

## 2022-04-23 DIAGNOSIS — I480 Paroxysmal atrial fibrillation: Secondary | ICD-10-CM | POA: Diagnosis not present

## 2022-04-23 DIAGNOSIS — I639 Cerebral infarction, unspecified: Secondary | ICD-10-CM | POA: Diagnosis not present

## 2022-04-23 DIAGNOSIS — N3001 Acute cystitis with hematuria: Secondary | ICD-10-CM | POA: Diagnosis not present

## 2022-04-23 LAB — CBC
HCT: 36.1 % — ABNORMAL LOW (ref 39.0–52.0)
Hemoglobin: 12.3 g/dL — ABNORMAL LOW (ref 13.0–17.0)
MCH: 37 pg — ABNORMAL HIGH (ref 26.0–34.0)
MCHC: 34.1 g/dL (ref 30.0–36.0)
MCV: 108.7 fL — ABNORMAL HIGH (ref 80.0–100.0)
Platelets: 165 10*3/uL (ref 150–400)
RBC: 3.32 MIL/uL — ABNORMAL LOW (ref 4.22–5.81)
RDW: 18.6 % — ABNORMAL HIGH (ref 11.5–15.5)
WBC: 20.3 10*3/uL — ABNORMAL HIGH (ref 4.0–10.5)
nRBC: 0 % (ref 0.0–0.2)

## 2022-04-23 LAB — COMPREHENSIVE METABOLIC PANEL
ALT: 13 U/L (ref 0–44)
AST: 31 U/L (ref 15–41)
Albumin: 3.2 g/dL — ABNORMAL LOW (ref 3.5–5.0)
Alkaline Phosphatase: 46 U/L (ref 38–126)
Anion gap: 9 (ref 5–15)
BUN: 37 mg/dL — ABNORMAL HIGH (ref 8–23)
CO2: 25 mmol/L (ref 22–32)
Calcium: 9.4 mg/dL (ref 8.9–10.3)
Chloride: 105 mmol/L (ref 98–111)
Creatinine, Ser: 1.14 mg/dL (ref 0.61–1.24)
GFR, Estimated: >60 mL/min (ref >60)
Glucose, Bld: 112 mg/dL — ABNORMAL HIGH (ref 70–99)
Potassium: 4.4 mmol/L (ref 3.5–5.1)
Sodium: 139 mmol/L (ref 135–145)
Total Bilirubin: 0.9 mg/dL (ref 0.3–1.2)
Total Protein: 6.3 g/dL — ABNORMAL LOW (ref 6.5–8.1)

## 2022-04-23 LAB — URINE CULTURE: Culture: >=100000 — AB

## 2022-04-23 LAB — TYPE AND SCREEN
ABO/RH(D): O POS
Antibody Screen: NEGATIVE

## 2022-04-23 LAB — PROTIME-INR
INR: 1 (ref 0.8–1.2)
Prothrombin Time: 13.2 seconds (ref 11.4–15.2)

## 2022-04-23 LAB — FIBRINOGEN: Fibrinogen: 472 mg/dL (ref 210–475)

## 2022-04-23 LAB — MRSA NEXT GEN BY PCR, NASAL: MRSA by PCR Next Gen: NOT DETECTED

## 2022-04-23 LAB — APTT: aPTT: 29 seconds (ref 24–36)

## 2022-04-23 LAB — GLUCOSE, CAPILLARY
Glucose-Capillary: 118 mg/dL — ABNORMAL HIGH (ref 70–99)
Glucose-Capillary: 120 mg/dL — ABNORMAL HIGH (ref 70–99)

## 2022-04-23 MED ORDER — METOPROLOL TARTRATE 5 MG/5ML IV SOLN
2.5000 mg | Freq: Four times a day (QID) | INTRAVENOUS | Status: DC | PRN
Start: 2022-04-23 — End: 2022-04-29

## 2022-04-23 MED ORDER — ONDANSETRON HCL 4 MG/2ML IJ SOLN: 4.0000 mg | Freq: Four times a day (QID) | INTRAMUSCULAR | Status: DC | PRN

## 2022-04-23 MED ORDER — PANTOPRAZOLE INFUSION (NEW) - SIMPLE MED
8.0000 mg/h | INTRAVENOUS | Status: AC
  Administered 2022-04-23 – 2022-04-25 (×7): 8 mg/h via INTRAVENOUS
  Filled 2022-04-23 (×7): qty 100

## 2022-04-23 MED ORDER — SODIUM CHLORIDE 0.9 % IV SOLN
2.0000 g | INTRAVENOUS | Status: DC
  Administered 2022-04-23: 2 g via INTRAVENOUS
  Filled 2022-04-23: qty 2
  Filled 2022-04-23: qty 20

## 2022-04-23 MED ORDER — SODIUM CHLORIDE 0.9 % IV SOLN: 1.0000 g | INTRAVENOUS | Status: DC

## 2022-04-23 MED ORDER — PANTOPRAZOLE 80MG IVPB - SIMPLE MED
80.0000 mg | Freq: Once | INTRAVENOUS | Status: AC
  Administered 2022-04-23: 80 mg via INTRAVENOUS
  Filled 2022-04-23: qty 100

## 2022-04-23 MED ORDER — SODIUM CHLORIDE 0.9 % IV SOLN
3.0000 g | Freq: Four times a day (QID) | INTRAVENOUS | Status: DC
  Filled 2022-04-23 (×2): qty 8

## 2022-04-23 MED ORDER — CHLORHEXIDINE GLUCONATE CLOTH 2 % EX PADS: 6.0000 | MEDICATED_PAD | Freq: Every day | CUTANEOUS | Status: DC

## 2022-04-23 NOTE — Progress Notes (Signed)
   04/22/22 2355  Clinical Encounter Type  Visited With Patient and family together;Health care provider  Visit Type Initial;Other (Comment) (rapid response)  Spiritual Encounters  Spiritual Needs Other (Comment) (not assessed)   Chaplain Burris provided non-anxious supportive presence and bore witness to care. Checked-in with Elnita Maxwell, RN and understood concerns. Offered silent prayer outside room. Pt not available while receiving care intervention.

## 2022-04-23 NOTE — Significant Event (Signed)
Rapid Response Event Note   Reason for Call :  Hematemesis and Aspiration   Initial Focused Assessment:  Upon arrival to room patient with increase work of breathing and crackles throughout lungs. Coffee Ground Emesis found beside patient.    Patient only oriented to self and this has been patients baseline per primary nurse. Vitals stable through out rapid refer to vital flow sheet.   Interventions:  500 bolus if normal saline started Patient deep suctioned with yanker  Patient encouraged to forcefully cough  Labs and CXR ordered    Plan of Care:  Level of care changed to PCU but no PCU beds avalabile transferred to ICU instead. Review labs, monitor vitals and help maintain airway clearance.    Event Summary:   MD Notified: Bishop Limbo Call Time:2352 Arrival [phone] End [id number]  Judyann Munson, RN

## 2022-04-23 NOTE — Progress Notes (Signed)
Contacted Millie Shorb (son, [phone]) and Renaldo Reel (caregiver, [phone]) advising that the Pt has been transferred to the ICU. Kathlee Nations advised this Clinical research associate not to contact his sister Consuello Masse, [phone] as he will notify her.

## 2022-04-23 NOTE — Progress Notes (Signed)
OT Cancellation Note  Patient Details Name: Richard Carter MRN: [id number] DOB: 10-Mar-1941   Cancelled Treatment:    Reason Eval/Treat Not Completed: Patient not medically ready. Chart reviewed.  Pt transferred to CCU 04/23/22.  D/t pt transferring to higher level of care, per protocol require new OT consult when pt is medically appropriate to participate in OT. Will sign off.   Kathie Dike, M.S. OTR/L  04/23/22, 8:27 AM  ascom [phone]

## 2022-04-23 NOTE — Progress Notes (Signed)
       CROSS COVER NOTE  NAME: Richard Carter MRN: [id number] DOB : 17-Apr-1941    Date of Service   04/23/22  HPI/Events of Note   Rapid Response called for hypoxia and coffee ground emesis 4L. Staff report patient had increased work of breathing when found. 500 mL NS bolus started by Rapid Response nurse for BP 90s/70s.  On assessment Mr Favor is lethargic but arousable to voice and currently protecting his airway. A strong cough is able to be elicited with the yankaur. Breath sounds are Coarse throughout (R) > (L). On my assessment Mr Lawhorn no longer has increased work of breathing.    Vitals on arrival to ICU BP 153/74 (98) HR 59 RR 21 SPO2 97% on 4L Nasal Cannula       Interventions   Plan:  Upper GI Bleed, Coffee Gound Emesis - Protonix Bolus and Gtt - SBP Prophylaxis: Ceftriaxone; H/O ETOH Use Disorder, no diagnosis of cirrhosis  - PRN Zofran - CBC, CMP, PT/INR, APTT, Type and Screen  Aspiration - CXR - IV Ceftriaxone - Aspiration Precautions - Will keep NPO tonight  Hypoxia - Supplemental O2, currently on 4L. Titrate to maintain sats 95%  Disposition: Upgrade to Progressive  Consulted with pharmacy regarding Unasyn + Ceftriaxone vs Ceftriaxone monotherapy, pharmacist recommended 2g Ceftriaxone for coverage for Aspiration and SBP prophylaxis   Family Communication: Brother updated at bedside in ICU      Bishop Limbo DNP, MHA, FNP-BC Nurse Practitioner [facility] Pager [phone]

## 2022-04-23 NOTE — Plan of Care (Signed)
TTE with LVEF of 55 to 60%, no regional LV wall motion abnormalities. No mural thrombus or valvular vegetation mentioned in the report.   Given that no intracardiac clot is visualized on TTE, recommend holding Eliquis for 5 days total (restart 6/1). He has been placed on ASA 81 mg daily while off eliquis, this may be discontinued when Eliquis is restarted.  Will need outpatient Neurology follow up after discharge.   Neurohospitalist service will sign off. Please call with any additional questions.   Electronically signed: Dr. Caryl Pina

## 2022-04-23 NOTE — Progress Notes (Addendum)
Progress Note    Richard CashLarry L Fellner  VWU:[id number] DOB: 05/28/1941  DOA: 04/20/2022 PCP: Marisue IvanLinthavong, Kanhka, MD      Brief Narrative:    Medical records reviewed and are as summarized below:  Richard Carter is a 81 y.o. male with medical history significant for dementia, history of stroke with left-sided hemiparesis, seizure disorder, hypertension, GERD, alcohol use disorder, tobacco use disorder, who was brought to the hospital for evaluation of altered mental status (lethargy) and difficulty speaking.     Assessment/Plan:   Principal Problem:   Acute CVA (cerebrovascular accident) (HCC) Active Problems:   UTI (urinary tract infection)   AF (paroxysmal atrial fibrillation) (HCC)   Non compliance w medication regimen   Seizures (HCC)   HTN (hypertension)   Dementia (HCC)   Alcohol abuse   Nicotine dependence   Elevated troponin   Aspiration pneumonia (HCC)    Body mass index is 23.3 kg/m.  Acute stroke, suspect embolic stroke, severe left ICA stenosis (70%):  Hold aspirin.  2D echo showed EF estimated at 55 to 60%, mild to moderate MR. PT and OT as able.  Outpatient follow-up with neurologist and vascular surgeon.  Altered mental status: Likely from acute stroke.  Repeat CT head.  Paroxysmal atrial fibrillation: Eliquis on hold.    Dehydration and poor oral intake: Restart IV fluids since patient is not able to take anything by mouth.  Aspiration pneumonia and E. coli UTI: Continue IV Rocephin  Hypokalemia and hypomagnesemia: Improved  Alcohol use disorder: Use Ativan only as needed per CIWA protocol.    Hypertension: Use IV metoprolol as needed for severe hypertension.  Other comorbidities include seizure disorder, dementia, tobacco use disorder, medical nonadherence  Plan of care was discussed with his brother, sister, son and caregiver in the conference room.  Diet Order             Diet NPO time specified  Diet effective now                             Consultants: Neurologist  Procedures: None    Medications:    atorvastatin  80 mg Oral Daily   Chlorhexidine Gluconate Cloth  6 each Topical Daily   folic acid  1 mg Oral Daily   magnesium oxide  400 mg Oral Daily   metoprolol succinate  12.5 mg Oral Daily   multivitamin with minerals  1 tablet Oral Daily   nicotine  14 mg Transdermal Daily   thiamine  100 mg Oral Daily   Or   thiamine  100 mg Intravenous Daily   traZODone  100 mg Oral QHS   Continuous Infusions:  cefTRIAXone (ROCEPHIN)  IV 2 g (04/23/22 0921)   levETIRAcetam 500 mg (04/23/22 1137)   pantoprazole 8 mg/hr (04/23/22 1149)     Anti-infectives (From admission, onward)    Start     Dose/Rate Route Frequency Ordered Stop   04/23/22 1000  cefTRIAXone (ROCEPHIN) 1 g in sodium chloride 0.9 % 100 mL IVPB  Status:  Discontinued        1 g 200 mL/hr over 30 Minutes Intravenous Every 24 hours 04/23/22 0029 04/23/22 0103   04/23/22 1000  cefTRIAXone (ROCEPHIN) 2 g in sodium chloride 0.9 % 100 mL IVPB        2 g 200 mL/hr over 30 Minutes Intravenous Every 24 hours 04/23/22 0103     04/23/22 0115  Ampicillin-Sulbactam (UNASYN)  3 g in sodium chloride 0.9 % 100 mL IVPB  Status:  Discontinued        3 g 200 mL/hr over 30 Minutes Intravenous Every 6 hours 04/23/22 0029 04/23/22 0103   04/20/22 1700  cefTRIAXone (ROCEPHIN) 1 g in sodium chloride 0.9 % 100 mL IVPB        1 g 200 mL/hr over 30 Minutes Intravenous Every 24 hours 04/20/22 1636 04/23/22 0913              Family Communication/Anticipated D/C date and plan/Code Status   DVT prophylaxis: Place and maintain sequential compression device Start: 04/23/22 0938 SCD's Start: 04/20/22 1549     Code Status: DNR  Family Communication:None Disposition Plan: Plan to discharge to SNF   Status is: Inpatient Remains inpatient appropriate because: Acute stroke, requires rehab at SNF       Subjective:   Interval events  noted.  Patient is lethargic and unable to provide any history.  Rapid response was called around midnight for hematemesis/coffee-ground emesis and low blood pressure requiring treatment with IV fluids.  Objective:    Vitals:   04/23/22 0900 04/23/22 1000 04/23/22 1100 04/23/22 1146  BP: 129/72 (!) 148/77 132/74   Pulse: (!) 50 (!) 53 (!) 51   Resp: 16 18 16    Temp:  97.8 F (36.6 C)  (!) 97.5 F (36.4 C)  TempSrc:  Oral  Oral  SpO2:      Weight:      Height:       No data found.   Intake/Output Summary (Last 24 hours) at 04/23/2022 1400 Last data filed at 04/23/2022 0800 Gross per 24 hour  Intake 267.4 ml  Output --  Net 267.4 ml   Filed Weights   04/20/22 1247  Weight: 63.5 kg    Exam:  GEN: Lethargic SKIN: Warm and dry EYES: No pallor or icterus ENT: MMM CV: RRR PULM: CTA B ABD: soft, ND, NT, +BS CNS: Lethargic, unable to follow commands EXT: No edema           Data Reviewed:   I have personally reviewed following labs and imaging studies:  Labs: Labs show the following:   Basic Metabolic Panel: Recent Labs  Lab 04/20/22 1253 04/20/22 1653 04/21/22 1113 04/22/22 0356 04/23/22 0047  NA 138  --  138 137 139  K 3.2*  --  3.2* 4.6 4.4  CL 102  --  103 101 105  CO2 28  --  27 27 25   GLUCOSE 93  --  181* 109* 112*  BUN 26*  --  26* 33* 37*  CREATININE 1.33*  --  1.36* 1.21 1.14  CALCIUM 8.9  --  9.5 9.5 9.4  MG  --  1.0* 2.2 1.9  --   PHOS  --   --  2.9 2.6  --    GFR Estimated Creatinine Clearance: 43.5 mL/min (by C-G formula based on SCr of 1.14 mg/dL). Liver Function Tests: Recent Labs  Lab 04/20/22 1253 04/23/22 0047  AST 28 31  ALT 12 13  ALKPHOS 61 46  BILITOT 1.6* 0.9  PROT 6.3* 6.3*  ALBUMIN 3.4* 3.2*   No results for input(s): LIPASE, AMYLASE in the last 168 hours. No results for input(s): AMMONIA in the last 168 hours. Coagulation profile Recent Labs  Lab 04/23/22 0047  INR 1.0    CBC: Recent Labs  Lab  04/20/22 1253 04/23/22 0047  WBC 6.6 20.3*  NEUTROABS 4.5  --  HGB 13.6 12.3*  HCT 40.3 36.1*  MCV 108.6* 108.7*  PLT 198 165   Cardiac Enzymes: No results for input(s): CKTOTAL, CKMB, CKMBINDEX, TROPONINI in the last 168 hours. BNP (last 3 results) No results for input(s): PROBNP in the last 8760 hours. CBG: Recent Labs  Lab 04/23/22 0001 04/23/22 0021  GLUCAP 120* 118*   D-Dimer: No results for input(s): DDIMER in the last 72 hours. Hgb A1c: Recent Labs    04/20/22 1653  HGBA1C 5.4   Lipid Profile: Recent Labs    04/21/22 0528  CHOL 143  HDL 70  LDLCALC 59  TRIG 72  CHOLHDL 2.0   Thyroid function studies: No results for input(s): TSH, T4TOTAL, T3FREE, THYROIDAB in the last 72 hours.  Invalid input(s): FREET3 Anemia work up: No results for input(s): VITAMINB12, FOLATE, FERRITIN, TIBC, IRON, RETICCTPCT in the last 72 hours. Sepsis Labs: Recent Labs  Lab 04/20/22 1253 04/23/22 0047  WBC 6.6 20.3*    Microbiology Recent Results (from the past 240 hour(s))  Urine Culture     Status: Abnormal   Collection Time: 04/20/22  3:44 PM   Specimen: Urine, Clean Catch  Result Value Ref Range Status   Specimen Description   Final    URINE, CLEAN CATCH Performed at [facility], [address], [place], [place] [address]    Special Requests   Final    NONE Performed at [facility] [address] [place], [place] [address]    Culture >=100,000 COLONIES/mL ESCHERICHIA COLI (A)  Final   Report Status 04/23/2022 FINAL  Final   Organism ID, Bacteria ESCHERICHIA COLI (A)  Final      Susceptibility   Escherichia coli - MIC*    AMPICILLIN 8 SENSITIVE Sensitive     CEFAZOLIN <=4 SENSITIVE Sensitive     CEFEPIME <=0.12 SENSITIVE Sensitive     CEFTRIAXONE <=0.25 SENSITIVE Sensitive     CIPROFLOXACIN >=4 RESISTANT Resistant     GENTAMICIN <=1 SENSITIVE Sensitive     IMIPENEM <=0.25 SENSITIVE Sensitive     NITROFURANTOIN <=16 SENSITIVE  Sensitive     TRIMETH/SULFA >=320 RESISTANT Resistant     AMPICILLIN/SULBACTAM 4 SENSITIVE Sensitive     PIP/TAZO <=4 SENSITIVE Sensitive     * >=100,000 COLONIES/mL ESCHERICHIA COLI  MRSA Next Gen by PCR, Nasal     Status: None   Collection Time: 04/23/22 12:50 AM   Specimen: Nasal Mucosa; Nasal Swab  Result Value Ref Range Status   MRSA by PCR Next Gen NOT DETECTED NOT DETECTED Final    Comment: (NOTE) The GeneXpert MRSA Assay (FDA approved for NASAL specimens only), is one component of a comprehensive MRSA colonization surveillance program. It is not intended to diagnose MRSA infection nor to guide or monitor treatment for MRSA infections. Test performance is not FDA approved in patients less than 51 years old. Performed at [facility], [address], [place], [place] [address]     Procedures and diagnostic studies:  DG Chest [facility] 1 View  Result Date: 04/23/2022 CLINICAL DATA:  Aspiration into airway.  Hypertension EXAM: PORTABLE CHEST 1 VIEW COMPARISON:  05/11/2021 FINDINGS: Heart is normal size. Interstitial prominence throughout the lungs. Somewhat focal opacities in the right upper lobe and right lower lobe. No effusions. No acute bony abnormality. IMPRESSION: Diffuse interstitial prominence. Patchy opacities in the right lung. Cannot exclude pneumonia. Electronically Signed   By: Charlett Nose M.D.   On: 04/23/2022 01:05   ECHOCARDIOGRAM COMPLETE  Result Date: 04/22/2022  ECHOCARDIOGRAM REPORT   Patient Name:   Richard Carter Date of Exam: 04/22/2022 Medical Rec #:  [id number]        Height:       65.0 in Accession #:    [id number]       Weight:       140.0 lb Date of Birth:  04/08/1941         BSA:          1.700 m Patient Age:    80 years         BP:           187/88 mmHg Patient Gender: M                HR:           52 bpm. Exam Location:  [facility] Procedure: 2D Echo, Cardiac Doppler and Color Doppler Indications:     I63.9 Stroke  History:         Patient has prior  history of Echocardiogram examinations.                  Arrythmias:Atrial Fibrillation; Risk Factors:Current Smoker,                  Dyslipidemia and Hypertension.  Sonographer:     Ceasar Mons Referring Phys:  [id number] AGBATA Diagnosing Phys: Marcina Millard MD  Sonographer Comments: Suboptimal parasternal window and suboptimal apical window. Image acquisition challenging due to uncooperative patient and Image acquisition challenging due to respiratory motion. IMPRESSIONS  1. Left ventricular ejection fraction, by estimation, is 55 to 60%. The left ventricle has normal function. The left ventricle has no regional wall motion abnormalities. Left ventricular diastolic parameters are indeterminate.  2. Right ventricular systolic function is normal. The right ventricular size is normal.  3. The mitral valve is normal in structure. Mild to moderate mitral valve regurgitation. No evidence of mitral stenosis.  4. The aortic valve is normal in structure. Aortic valve regurgitation is not visualized. No aortic stenosis is present.  5. The inferior vena cava is normal in size with greater than 50% respiratory variability, suggesting right atrial pressure of 3 mmHg. FINDINGS  Left Ventricle: Left ventricular ejection fraction, by estimation, is 55 to 60%. The left ventricle has normal function. The left ventricle has no regional wall motion abnormalities. The left ventricular internal cavity size was normal in size. There is  no left ventricular hypertrophy. Left ventricular diastolic parameters are indeterminate. Right Ventricle: The right ventricular size is normal. No increase in right ventricular wall thickness. Right ventricular systolic function is normal. Left Atrium: Left atrial size was normal in size. Right Atrium: Right atrial size was normal in size. Pericardium: There is no evidence of pericardial effusion. Mitral Valve: The mitral valve is normal in structure. Mild to moderate mitral valve  regurgitation. No evidence of mitral valve stenosis. MV peak gradient, 1.3 mmHg. The mean mitral valve gradient is 1.0 mmHg. Tricuspid Valve: The tricuspid valve is normal in structure. Tricuspid valve regurgitation is mild . No evidence of tricuspid stenosis. Aortic Valve: The aortic valve is normal in structure. Aortic valve regurgitation is not visualized. No aortic stenosis is present. Pulmonic Valve: The pulmonic valve was normal in structure. Pulmonic valve regurgitation is not visualized. No evidence of pulmonic stenosis. Aorta: The aortic root is normal in size and structure. Venous: The inferior vena cava is normal in size with greater than 50% respiratory variability, suggesting right atrial pressure of 3  mmHg. IAS/Shunts: No atrial level shunt detected by color flow Doppler.  LEFT VENTRICLE PLAX 2D LVIDd:         3.02 cm   Diastology LVIDs:         2.18 cm   LV e' medial:    4.90 cm/s LV PW:         1.15 cm   LV E/e' medial:  15.3 LV IVS:        1.47 cm   LV e' lateral:   5.00 cm/s LVOT diam:     2.20 cm   LV E/e' lateral: 15.0 LV SV:         61 LV SV Index:   36 LVOT Area:     3.80 cm  RIGHT VENTRICLE RV Basal diam:  2.88 cm RV S prime:     20.40 cm/s LEFT ATRIUM           Index        RIGHT ATRIUM           Index LA diam:      2.30 cm 1.35 cm/m   RA Area:     11.50 cm LA Vol (A4C): 64.9 ml 38.18 ml/m  RA Volume:   19.10 ml  11.24 ml/m  AORTIC VALVE             PULMONIC VALVE LVOT Vmax:   76.10 cm/s  PV Vmax:       0.60 m/s LVOT Vmean:  43.800 cm/s PV Vmean:      46.500 cm/s LVOT VTI:    0.161 m     PV VTI:        0.167 m                          PV Peak grad:  1.4 mmHg AORTA                    PV Mean grad:  1.0 mmHg Ao Root diam: 3.50 cm  MITRAL VALVE MV Area (PHT): 3.31 cm    SHUNTS MV Area VTI:   2.02 cm    Systemic VTI:  0.16 m MV Peak grad:  1.3 mmHg    Systemic Diam: 2.20 cm MV Mean grad:  1.0 mmHg MV Vmax:       0.57 m/s MV Vmean:      45.6 cm/s MV Decel Time: 229 msec MV E velocity: 74.90  cm/s MV A velocity: 60.70 cm/s MV E/A ratio:  1.23 Marcina Millard MD Electronically signed by Marcina Millard MD Signature Date/Time: 04/22/2022/2:11:55 PM    Final    US Abdomen Limited RUQ (LIVER/GB)  Result Date: 04/23/2022 CLINICAL DATA:  Hematemesis EXAM: ULTRASOUND ABDOMEN LIMITED RIGHT UPPER QUADRANT COMPARISON:  None Available. FINDINGS: Gallbladder: No gallstones or wall thickening visualized. No sonographic Murphy sign noted by sonographer. Common bile duct: Diameter: 4 mm Liver: There is increased echogenicity in the liver. No focal abnormality is seen. Portal vein is patent on color Doppler imaging with normal direction of blood flow towards the liver. Other: None. IMPRESSION: Fatty liver. No other sonographic abnormality is seen in the right upper quadrant. Electronically Signed   By: Ernie Avena M.D.   On: 04/23/2022 11:08               LOS: 3 days   Huriel Matt  [facility]   Pager on [email or web address]. If 7PM-7AM, please contact night-coverage at [email or web address]  04/23/2022, 2:00 PM

## 2022-04-23 NOTE — Progress Notes (Signed)
PT Cancellation Note  Patient Details Name: SHAWNEE HIGHAM MRN: [id number] DOB: 1941-08-31   Cancelled Treatment:    Reason Eval/Treat Not Completed: Patient not medically ready.  Chart reviewed.  Pt transferred to CCU 04/23/22.  D/t pt transferring to higher level of care, per PT protocol require new PT consult in order to continue therapy (will discontinue current PT order d/t this).  Please re-consult PT when pt is medically appropriate to participate in PT.  Hendricks Limes, PT 04/23/22, 8:12 AM

## 2022-04-23 NOTE — Progress Notes (Addendum)
SLP Cancellation Note  Patient Details Name: MENELIK MCFARREN MRN: [id number] DOB: 18-May-1941   Cancelled treatment:       Reason Eval/Treat Not Completed: Medical issues which prohibited therapy;Patient not medically ready (chart reviewed.) Per chart notes,pt found lethargic with coffee ground brown emesis on face and chest last night - possible aspiration of vomit. Placed Pt on 4L O2 via [place], suctioned to maintain patent airway, and transferred to CCU for care. He is currently lethargic per notes.  ST services will hold on further Cognitive-linguistic tx services (per the current order) as pt is medically unable to meaningfully participate in tx. ST services will await new orders d/t transfer to higher level of care per protocol. Will sign off at this time.       Jerilynn Som, MS, CCC-SLP Speech Language Pathologist Rehab Services; [facility] [phone] (ascom) Mykenzi Vanzile 04/23/2022, 8:54 AM

## 2022-04-23 NOTE — Progress Notes (Signed)
   04/22/22 2354  Assess: MEWS Score  BP 113/74  Pulse Rate 88  SpO2 98 %  Assess: MEWS Score  MEWS Temp 0  MEWS Systolic 0  MEWS Pulse 0  MEWS RR 0  MEWS LOC 1  MEWS Score 1  MEWS Score Color Green  Notify: Charge Nurse/RN  Name of Charge Nurse/RN Notified Marny Lowenstein, RN  Date Charge Nurse/RN Notified 04/22/22  Time Charge Nurse/RN Notified 2354  Notify: Provider  Provider Name/Title Demetria Pore, NP  Date Provider Notified 04/22/22  Time Provider Notified 2354  Method of Notification Call  Notification Reason Change in status (Pt found very lethargic, VS below baseline)  Provider response At bedside  Date of Provider Response 04/22/22  Time of Provider Response 2355  Notify: Rapid Response  Name of Rapid Response RN Notified Bernadene Person, RN  Date Rapid Response Notified 04/22/22  Time Rapid Response Notified 2352  Document  Patient Outcome Transferred/level of care increased  Progress note created (see row info) Yes  Assess: SIRS CRITERIA  SIRS Temperature  0  SIRS Pulse 0  SIRS Respirations  0  SIRS WBC 0  SIRS Score Sum  0   Pt found lethargic with coffee ground brown emesis on face and chest - possible aspiration. Placed Pt on 4L O2 via [place] and suctioned to maintain patent airway.

## 2022-04-24 DIAGNOSIS — Z515 Encounter for palliative care: Secondary | ICD-10-CM

## 2022-04-24 DIAGNOSIS — F0390 Unspecified dementia without behavioral disturbance: Secondary | ICD-10-CM | POA: Diagnosis not present

## 2022-04-24 DIAGNOSIS — Z7189 Other specified counseling: Secondary | ICD-10-CM | POA: Diagnosis not present

## 2022-04-24 DIAGNOSIS — I480 Paroxysmal atrial fibrillation: Secondary | ICD-10-CM | POA: Diagnosis not present

## 2022-04-24 DIAGNOSIS — J690 Pneumonitis due to inhalation of food and vomit: Secondary | ICD-10-CM | POA: Diagnosis not present

## 2022-04-24 DIAGNOSIS — N3001 Acute cystitis with hematuria: Secondary | ICD-10-CM | POA: Diagnosis not present

## 2022-04-24 DIAGNOSIS — I639 Cerebral infarction, unspecified: Secondary | ICD-10-CM | POA: Diagnosis not present

## 2022-04-24 DIAGNOSIS — Z66 Do not resuscitate: Secondary | ICD-10-CM

## 2022-04-24 LAB — CBC
HCT: 33.6 % — ABNORMAL LOW (ref 39.0–52.0)
Hemoglobin: 11.5 g/dL — ABNORMAL LOW (ref 13.0–17.0)
MCH: 37 pg — ABNORMAL HIGH (ref 26.0–34.0)
MCHC: 34.2 g/dL (ref 30.0–36.0)
MCV: 108 fL — ABNORMAL HIGH (ref 80.0–100.0)
Platelets: 158 10*3/uL (ref 150–400)
RBC: 3.11 MIL/uL — ABNORMAL LOW (ref 4.22–5.81)
RDW: 18.7 % — ABNORMAL HIGH (ref 11.5–15.5)
WBC: 14.5 10*3/uL — ABNORMAL HIGH (ref 4.0–10.5)
nRBC: 0 % (ref 0.0–0.2)

## 2022-04-24 LAB — BASIC METABOLIC PANEL
Anion gap: 9 (ref 5–15)
BUN: 36 mg/dL — ABNORMAL HIGH (ref 8–23)
CO2: 24 mmol/L (ref 22–32)
Calcium: 9.3 mg/dL (ref 8.9–10.3)
Chloride: 105 mmol/L (ref 98–111)
Creatinine, Ser: 1.12 mg/dL (ref 0.61–1.24)
GFR, Estimated: >60 mL/min (ref >60)
Glucose, Bld: 73 mg/dL (ref 70–99)
Potassium: 4.1 mmol/L (ref 3.5–5.1)
Sodium: 138 mmol/L (ref 135–145)

## 2022-04-24 LAB — PHOSPHORUS: Phosphorus: 3.1 mg/dL (ref 2.5–4.6)

## 2022-04-24 LAB — MAGNESIUM: Magnesium: 1.5 mg/dL — ABNORMAL LOW (ref 1.7–2.4)

## 2022-04-24 MED ORDER — STROKE: EARLY STAGES OF RECOVERY BOOK
Freq: Once | Status: AC
Start: 2022-04-24 — End: 2022-04-24

## 2022-04-24 MED ORDER — DEXTROSE-NACL 5-0.9 % IV SOLN: INTRAVENOUS | Status: AC

## 2022-04-24 MED ORDER — NEPRO/CARBSTEADY PO LIQD
237.0000 mL | Freq: Three times a day (TID) | ORAL | Status: DC
  Administered 2022-04-24 – 2022-04-29 (×11): 237 mL via ORAL

## 2022-04-24 MED ORDER — SENNOSIDES-DOCUSATE SODIUM 8.6-50 MG PO TABS: 1.0000 | ORAL_TABLET | Freq: Two times a day (BID) | ORAL | Status: DC

## 2022-04-24 MED ORDER — POLYETHYLENE GLYCOL 3350 17 G PO PACK: 17.0000 g | PACK | Freq: Every day | ORAL | Status: DC

## 2022-04-24 MED ORDER — CHLORHEXIDINE GLUCONATE 0.12 % MT SOLN
15.0000 mL | Freq: Two times a day (BID) | OROMUCOSAL | Status: DC
  Administered 2022-04-24 – 2022-04-29 (×11): 15 mL via OROMUCOSAL
  Filled 2022-04-24 (×11): qty 15

## 2022-04-24 MED ORDER — ADULT MULTIVITAMIN W/MINERALS CH
1.0000 | ORAL_TABLET | Freq: Every day | ORAL | Status: DC
  Administered 2022-04-24 – 2022-04-29 (×6): 1 via ORAL
  Filled 2022-04-24 (×6): qty 1

## 2022-04-24 MED ORDER — ORAL CARE MOUTH RINSE
15.0000 mL | Freq: Two times a day (BID) | OROMUCOSAL | Status: DC
  Administered 2022-04-24 – 2022-04-29 (×6): 15 mL via OROMUCOSAL

## 2022-04-24 MED ORDER — BISACODYL 10 MG RE SUPP: 10.0000 mg | Freq: Every day | RECTAL | Status: DC | PRN

## 2022-04-24 MED ORDER — LOSARTAN POTASSIUM 50 MG PO TABS
50.0000 mg | ORAL_TABLET | Freq: Every day | ORAL | Status: DC
  Administered 2022-04-24 – 2022-04-29 (×6): 50 mg via ORAL
  Filled 2022-04-24 (×6): qty 1

## 2022-04-24 MED ORDER — MAGNESIUM SULFATE 2 GM/50ML IV SOLN
2.0000 g | Freq: Once | INTRAVENOUS | Status: AC
  Administered 2022-04-24: 2 g via INTRAVENOUS
  Filled 2022-04-24: qty 50

## 2022-04-24 MED ORDER — AMLODIPINE BESYLATE 5 MG PO TABS
5.0000 mg | ORAL_TABLET | Freq: Every day | ORAL | Status: DC
  Administered 2022-04-24 – 2022-04-29 (×6): 5 mg via ORAL
  Filled 2022-04-24 (×6): qty 1

## 2022-04-24 MED ORDER — SODIUM CHLORIDE 0.9 % IV SOLN
3.0000 g | Freq: Four times a day (QID) | INTRAVENOUS | Status: DC
  Administered 2022-04-24 – 2022-04-29 (×20): 3 g via INTRAVENOUS
  Filled 2022-04-24 (×4): qty 8
  Filled 2022-04-24: qty 3
  Filled 2022-04-24 (×3): qty 8
  Filled 2022-04-24: qty 3
  Filled 2022-04-24 (×7): qty 8
  Filled 2022-04-24: qty 3
  Filled 2022-04-24 (×2): qty 8
  Filled 2022-04-24: qty 3
  Filled 2022-04-24 (×4): qty 8

## 2022-04-24 MED ORDER — BLISTEX MEDICATED EX OINT: TOPICAL_OINTMENT | CUTANEOUS | Status: DC | PRN

## 2022-04-24 NOTE — Evaluation (Signed)
Clinical/Bedside Swallow Evaluation Patient Details  Name: Richard Carter MRN: [id number] Date of Birth: 1941-05-19  Today's Date: 04/24/2022 Time: SLP Start Time (ACUTE ONLY): 1505 SLP Stop Time (ACUTE ONLY): 1550 SLP Time Calculation (min) (ACUTE ONLY): 45 min  Past Medical History:  Past Medical History:  Diagnosis Date   Alcohol abuse    Dementia (HCC)    GERD (gastroesophageal reflux disease)    HLD (hyperlipidemia)    HTN (hypertension)    Seizures (HCC)    Stroke [facility])    Past Surgical History:  Past Surgical History:  Procedure Laterality Date   skin cyst removal     HPI:  Per H&P "Richard Carter is a 81 y.o. male with medical history significant for advanced Dementia, history of CVA with left-sided hemiparesis, GERD, alcohol abuse, nicotine dependence, seizures who was brought into the ER by EMS for evaluation of mental status changes.  Per patient's caregiver his last known well was about 7.30 AM on the morning of admission when he administered patient's medications.  He noted that patient was lethargic and had slurred speech at about 11:30 AM on the day of admission and so he called EMS.  He states that patient drinks alcohol daily and smokes about 10 cigarettes a day.  He also states that he administers the patient's medications but can't "force him" to take the meds.  Code stroke was called and patient's initial CT scan of the head showed new/interval area of hypodensity within the right parietal lobe, suggestive of infarct that is otherwise age indeterminate by CT. Consider MRI for more sensitive evaluation. No acute hemorrhage  Remote corona radiata infarcts and chronic microvascular ischemic disease.  Patient had a CT angiogram which showed extensive atherosclerosis at the left carotid bifurcation with severe (approximately 70%) stenosis of the left ICA origin. Aortic Atherosclerosis and Emphysema .  CT perfusion: No evidence of penumbra or core infarct.  I am unable to do  a review of systems on this patient due to his dysarthria  Patient did not receive tPA since his last dose of Eliquis was on the morning of his admission.".  During this admit, he had a decline in status s/p Emesis w/ potential aspiration of Emesis. He was transferred to CCU d/t decline in status; transferred back to regular floor today/at this eval.    Assessment / Plan / Recommendation  Clinical Impression  Pt appears to present w/ oropharyngeal phase Dysphagia; suspect impact from Baseline declined Cognitive status/Dementia -- "Moderate Atrophy and extensive chronic small vessel ischemic changes of the white matter", as well as impact from new right and bilateral frontal lobe, right posterior temporal lobe infarcts, per MRI this admit. OF NOTE: pt had recent Emesis episodes w/ suspected aspiration of Emesis -- a significant event. Cognitive decline can impact overall awareness/timing of swallow and safety during po tasks which increases risk for aspiration, choking. Pt's risk for aspiration can be reduced when following general aspiration precautions, given feeding support/Supervision, and when using a modified diet consistency of puree foods w/ Nectar consistency liquids. Pt also appears Edentulous. He required Mod verbal/visual/tactile cues for follow through during po tasks.         Pt consumed several trials of ice chips, purees, and thin/Nectar liquids via straw. Overt clinical s/s of aspiration noted w/ thin liquid trials c/b audible swallows, disorganized swallowing, and overt coughing. No overt, clinical s/s of aspiration noted w/ purees, Nectar liquids, and single ice chips: no decline in vocal quality; no cough,  and no decline in respiratory status during/post trials. Oral phase was adequate for bolus management and oral clearing of the boluses given. Pt did not fully attempt self-feeding; decreased awareness of task and poor follow through noted. He required Mod support and guidance d/t the  Cognitive decline. OM Exam was cursory; pt could not fully participate and chewed on this Clinician's fingers, however, No unilateral weakness noted during bolus acceptance and oral clearing was adequate(lingual sweeping); no anterior leakage noted(labial closure adequate). Confusion w/ oral care attempt noted.          In setting of baseline Dementia and Cognitive decline, Edentulous status, recent CVA, and risk for aspiration, recommend initiation of the dysphagia level 1(Puree foods) moistened w/ Nectar consistency liquids; aspiration precautions; reduce Distractions during meals and engage pt during meals for self-feeding. Pills Crushed in Puree for safer swallowing. Support w/ feeding at meals. REFLUX precautions. MD/NSG updated.  ST services recommends follow w/ Palliative Care for GOC and education re: impact of Cognitive decline/Dementia on swallowing, oral intake in general. Precautions posted in room. SLP Visit Diagnosis: Dysphagia, oropharyngeal phase (R13.12) (baseline Dementia)    Aspiration Risk  Moderate aspiration risk;Risk for inadequate nutrition/hydration    Diet Recommendation   dysphagia level 1(Puree foods) moistened w/ Nectar consistency liquids; aspiration precautions; reduce Distractions during meals and engage pt during meals for self-feeding. Support w/ feeding at meals. REFLUX precautions.  Medication Administration: Crushed with puree (for safer swallowing)    Other  Recommendations Recommended Consults:  (Dietician f/u; Palliative Care f/u) Oral Care Recommendations: Oral care BID;Oral care before and after PO;Staff/trained caregiver to provide oral care Other Recommendations: Order thickener from pharmacy;Prohibited food (jello, ice cream, thin soups);Remove water pitcher;Have oral suction available    Recommendations for follow up therapy are one component of a multi-disciplinary discharge planning process, led by the attending physician.  Recommendations may be  updated based on patient status, additional functional criteria and insurance authorization.  Follow up Recommendations Skilled nursing-short term rehab (<3 hours/day)      Assistance Recommended at Discharge Frequent or constant Supervision/Assistance  Functional Status Assessment Patient has had a recent decline in their functional status and/or demonstrates limited ability to make significant improvements in function in a reasonable and predictable amount of time  Frequency and Duration min 1 x/week  1 week       Prognosis Prognosis for Safe Diet Advancement: Fair Barriers to Reach Goals: Cognitive deficits;Language deficits;Time post onset;Severity of deficits Barriers/Prognosis Comment: baseline Dementia      Swallow Study   General Date of Onset: 04/20/22 HPI: Per H&P "Richard Carter is a 81 y.o. male with medical history significant for advanced Dementia, history of CVA with left-sided hemiparesis, GERD, alcohol abuse, nicotine dependence, seizures who was brought into the ER by EMS for evaluation of mental status changes.  Per patient's caregiver his last known well was about 7.30 AM on the morning of admission when he administered patient's medications.  He noted that patient was lethargic and had slurred speech at about 11:30 AM on the day of admission and so he called EMS.  He states that patient drinks alcohol daily and smokes about 10 cigarettes a day.  He also states that he administers the patient's medications but can't "force him" to take the meds.  Code stroke was called and patient's initial CT scan of the head showed new/interval area of hypodensity within the right parietal lobe, suggestive of infarct that is otherwise age indeterminate by CT. Consider MRI  for more sensitive evaluation. No acute hemorrhage  Remote corona radiata infarcts and chronic microvascular ischemic disease.  Patient had a CT angiogram which showed extensive atherosclerosis at the left carotid  bifurcation with severe (approximately 70%) stenosis of the left ICA origin. Aortic Atherosclerosis and Emphysema .  CT perfusion: No evidence of penumbra or core infarct.  I am unable to do a review of systems on this patient due to his dysarthria  Patient did not receive tPA since his last dose of Eliquis was on the morning of his admission.".  During this admit, he had a decline in status s/p Emesis w/ potential aspiration of Emesis. He was transferred to CCU d/t decline in status; transferred back to regular floor today/at this eval. Type of Study: Bedside Swallow Evaluation Previous Swallow Assessment: BSE/tx during admit, 04/2021 Diet Prior to this Study: NPO Temperature Spikes Noted: No (wbc 14.5 declining) Respiratory Status: Nasal cannula (2-3L) History of Recent Intubation: No Behavior/Cognition: Alert;Cooperative;Pleasant mood;Confused;Distractible;Requires cueing (baseline Dementia) Oral Cavity Assessment: Dry Oral Care Completed by SLP: Yes Oral Cavity - Dentition: Edentulous (could not detect dentition -- pt closed mouth on this clinician's fingers, then chewed on them) Vision:  (n/a) Self-Feeding Abilities: Total assist Patient Positioning: Upright in bed (needed full positioning support) Baseline Vocal Quality: Normal (dysarthria; mumbled speech) Volitional Cough: Cognitively unable to elicit Volitional Swallow: Unable to elicit    Oral/Motor/Sensory Function Overall Oral Motor/Sensory Function:  (No unilateral weakness noted during oral movements to accept boluses nor during bolus management)   Ice Chips Ice chips: Within functional limits (grossly) Presentation: Spoon (fed; 3 trials)   Thin Liquid Thin Liquid: Impaired Presentation: Straw (monitored sips; 6 trials) Oral Phase Impairments: Poor awareness of bolus (impulsive drinking) Pharyngeal  Phase Impairments: Suspected delayed Swallow;Cough - Immediate (audible swallows) Other Comments: coughing x2/6 trials    Nectar  Thick Nectar Thick Liquid: Within functional limits Presentation: Straw (monitoed; ~3ozs)   Honey Thick Honey Thick Liquid: Not tested   Puree Puree: Within functional limits Presentation: Spoon (fed; ~3 ozs)   Solid     Solid: Not tested Other Comments: missing Dentition, Dementia         Jerilynn SomKatherine Adele Milson, MS, CCC-SLP Speech Language Pathologist Rehab Services; [facility] [place] [phone] (ascom)  Rachelle Edwards 04/24/2022,6:05 PM

## 2022-04-24 NOTE — Progress Notes (Signed)
Initial Nutrition Assessment  DOCUMENTATION CODES:   Not applicable  INTERVENTION:   -MVI with minerals daily -Feeding assistance with meals -Nepro Shake po TID, each supplement provides 425 kcal and 19 grams protein   NUTRITION DIAGNOSIS:   Predicted suboptimal nutrient intake related to chronic illness (dementia) as evidenced by estimated needs.  GOAL:   Patient will meet greater than or equal to 90% of their needs  MONITOR:   PO intake, Supplement acceptance, Diet advancement  REASON FOR ASSESSMENT:   Rounds    ASSESSMENT:   Pt with medical history significant for dementia, history of CVA with left-sided hemiparesis, GERD, alcohol abuse, nicotine dependence, seizures who presented for evaluation of mental status changes.  Pt admitted with acute CVA and UTI.   5/31- s/p BSE- advanced to dysphagia 1 diet with nectar thick liquids  Reviewed I/O's: +150 ml x 24 hours and +147 since admission  UOP: 400 ml x 24 hours   Pt with hematemesis. Per GI, pt is not a candidate for GI procedures. Plan for palliative care consult.   Case discussed with SLP; pt has been advanced to a dysphagia 1 diet with nectar thick liquids and is requesting strawberry Ensure.   Case discussed with RN; acknowledges that diet has just been advanced. She will attempt meds crushed in applesauce.   Pt not very interactive at time of visit. Pt responded "hey" when this RD greeted him, but did not answer any further questions. RN reports similar experiences with him during this shift.   Reviewed wt hx; wt has been stable over the past year.   Medications reviewed and include thiamine, keppra, and dextrose 5%-0.9% sodium chloride infusion @ 75 ml/hr.   Labs reviewed: CBGS: 118.   NUTRITION - FOCUSED PHYSICAL EXAM:  Flowsheet Row Most Recent Value  Orbital Region No depletion  Upper Arm Region Moderate depletion  Thoracic and Lumbar Region No depletion  Buccal Region No depletion  Temple  Region Mild depletion  Clavicle Bone Region No depletion  Clavicle and Acromion Bone Region No depletion  Scapular Bone Region No depletion  Dorsal Hand Moderate depletion  Patellar Region Moderate depletion  Anterior Thigh Region Moderate depletion  Posterior Calf Region Moderate depletion  Edema (RD Assessment) None  Hair Reviewed  Eyes Reviewed  Mouth Reviewed  Skin Reviewed  Nails Reviewed       Diet Order:   Diet Order             DIET - DYS 1 Room service appropriate? Yes with Assist; Fluid consistency: Nectar Thick  Diet effective now                   EDUCATION NEEDS:   Not appropriate for education at this time  Skin:  Skin Assessment: Reviewed RN Assessment  Last BM:  04/20/22  Height:   Ht Readings from Last 1 Encounters:  04/20/22 5\' 5"  (1.651 m)    Weight:   Wt Readings from Last 1 Encounters:  04/20/22 63.5 kg    Ideal Body Weight:  61.8 kg  BMI:  Body mass index is 23.3 kg/m.  Estimated Nutritional Needs:   Kcal:  1700-1900  Protein:  85-100 grams  Fluid:  > 1.7 L    04/22/22, RD, LDN, CDCES Registered Dietitian II Certified Diabetes Care and Education Specialist Please refer to [facility] for RD and/or RD on-call/weekend/after hours pager

## 2022-04-24 NOTE — Progress Notes (Addendum)
Progress Note    Richard Carter  ZOX:[id number] DOB: [birth year]  DOA: 04/20/2022 PCP: Marisue Ivan, MD      Brief Narrative:    Medical records reviewed and are as summarized below:  Richard Carter is a 81 y.o. male with medical history significant for dementia, history of stroke with left-sided hemiparesis, seizure disorder, hypertension, GERD, alcohol use disorder, tobacco use disorder, who was brought to the hospital for evaluation of altered mental status (lethargy) and difficulty speaking.  He was found to have acute embolic stroke, severe left ICA stenosis and acute UTI.  Treatment with aspirin for 5 days followed by resumption of Eliquis was recommended.  He was treated with IV Rocephin for UTI.  Unfortunately, patient developed acute GI bleeding/hematemesis so aspirin was held.  He developed aspiration pneumonia which was treated with antibiotics as well.  He was given IV fluids for dehydration and poor oral intake.  He had hypokalemia and hypomagnesemia that were repleted.   Assessment/Plan:   Principal Problem:   Acute CVA (cerebrovascular accident) (HCC) Active Problems:   UTI (urinary tract infection)   AF (paroxysmal atrial fibrillation) (HCC)   Non compliance w medication regimen   Seizures (HCC)   HTN (hypertension)   Dementia (HCC)   Alcohol abuse   Nicotine dependence   Elevated troponin   Aspiration pneumonia (HCC)    Body mass index is 23.3 kg/m.  Acute stroke, suspect embolic stroke, severe left ICA stenosis (70%):  Patient wants to be treated with aspirin for 5 days and resume Eliquis but aspirin has been held because of acute GI bleeding. 2D echo showed EF estimated at 55 to 60%, mild to moderate MR. PT and OT as able.  Outpatient follow-up with neurologist and vascular surgeon.  Altered mental status: Improving.  Likely from acute stroke.  Repeat CT head confirmed embolic stroke.  Paroxysmal atrial fibrillation: Eliquis on hold.     Dysphagia, dehydration and poor oral intake: Restart IV fluids.  Speech therapist recommended dysphagia 1 diet.  Aspiration pneumonia and E. coli UTI: Continue IV Rocephin.  Follow-up with speech therapist for swallow evaluation.  Acute GI Bleeding/ hematemesis: H&H is stable.  Continue IV Protonix.  Aspirin still on hold.  Consulted Dr. Mia Creek, gastroenterologist, to assist with management.    Hypokalemia and hypomagnesemia: Replete magnesium with IV magnesium sulfate.  Potassium level is okay.  Alcohol use disorder: Use Ativan only as needed per CIWA protocol.    Hypertension: Use IV metoprolol as needed for severe hypertension.  Restart amlodipine and losartan.  Other comorbidities include seizure disorder, dementia, tobacco use disorder, medical nonadherence  Consult palliative care to assist assist with goals of care  Diet Order             Diet NPO time specified  Diet effective now                            Consultants: Neurologist  Procedures: None    Medications:    atorvastatin  80 mg Oral Daily   chlorhexidine  15 mL Mouth Rinse BID   mouth rinse  15 mL Mouth Rinse q12n4p   metoprolol succinate  12.5 mg Oral Daily   nicotine  14 mg Transdermal Daily   thiamine  100 mg Oral Daily   Or   thiamine  100 mg Intravenous Daily   traZODone  100 mg Oral QHS   Continuous Infusions:  cefTRIAXone (ROCEPHIN)  IV Stopped (04/23/22 0951)   dextrose 5 % and 0.9% NaCl     levETIRAcetam Stopped (04/24/22 0100)   magnesium sulfate bolus IVPB 2 g (04/24/22 1120)   pantoprazole 8 mg/hr (04/24/22 0750)     Anti-infectives (From admission, onward)    Start     Dose/Rate Route Frequency Ordered Stop   04/23/22 1000  cefTRIAXone (ROCEPHIN) 1 g in sodium chloride 0.9 % 100 mL IVPB  Status:  Discontinued        1 g 200 mL/hr over 30 Minutes Intravenous Every 24 hours 04/23/22 0029 04/23/22 0103   04/23/22 1000  cefTRIAXone (ROCEPHIN) 2 g in sodium  chloride 0.9 % 100 mL IVPB        2 g 200 mL/hr over 30 Minutes Intravenous Every 24 hours 04/23/22 0103     04/23/22 0115  Ampicillin-Sulbactam (UNASYN) 3 g in sodium chloride 0.9 % 100 mL IVPB  Status:  Discontinued        3 g 200 mL/hr over 30 Minutes Intravenous Every 6 hours 04/23/22 0029 04/23/22 0103   04/20/22 1700  cefTRIAXone (ROCEPHIN) 1 g in sodium chloride 0.9 % 100 mL IVPB        1 g 200 mL/hr over 30 Minutes Intravenous Every 24 hours 04/20/22 1636 04/23/22 0913              Family Communication/Anticipated D/C date and plan/Code Status   DVT prophylaxis: Place and maintain sequential compression device Start: 04/23/22 0938 SCD's Start: 04/20/22 1549     Code Status: DNR  Family Communication:None Disposition Plan: Plan to discharge to SNF   Status is: Inpatient Remains inpatient appropriate because: Acute stroke, requires rehab at SNF       Subjective:   Interval events noted.  He is unable to provide any history.  No reported hematemesis  Objective:    Vitals:   04/24/22 0051 04/24/22 0430 04/24/22 0745 04/24/22 1130  BP: (!) 170/79 (!) 150/69 (!) 147/62 (!) 157/74  Pulse: 63 62 (!) 104 61  Resp: 18 16 18 17   Temp: 98.1 F (36.7 C) 98.1 F (36.7 C) 97.8 F (36.6 C) 97.6 F (36.4 C)  TempSrc: Oral Oral Axillary Oral  SpO2: 100% 98% 98% 100%  Weight:      Height:       No data found.   Intake/Output Summary (Last 24 hours) at 04/24/2022 1138 Last data filed at 04/24/2022 1054 Gross per 24 hour  Intake 299.69 ml  Output 500 ml  Net -200.31 ml   Filed Weights   04/20/22 1247  Weight: 63.5 kg    Exam:  GEN: NAD SKIN: Warm and dry EYES: No pallor or icterus ENT: MMM CV: RRR PULM: CTA B ABD: soft, ND, NT, +BS CNS: AAO x person, place and situation, non focal.  Speech is slurred.  He is unable to fully follow commands EXT: No edema or tenderness          Data Reviewed:   I have personally reviewed following labs  and imaging studies:  Labs: Labs show the following:   Basic Metabolic Panel: Recent Labs  Lab 04/20/22 1253 04/20/22 1653 04/21/22 1113 04/22/22 0356 04/23/22 0047 04/24/22 0724  NA 138  --  138 137 139 138  K 3.2*  --  3.2* 4.6 4.4 4.1  CL 102  --  103 101 105 105  CO2 28  --  27 27 25 24   GLUCOSE 93  --  181* 109* 112* 73  BUN 26*  --  26* 33* 37* 36*  CREATININE 1.33*  --  1.36* 1.21 1.14 1.12  CALCIUM 8.9  --  9.5 9.5 9.4 9.3  MG  --  1.0* 2.2 1.9  --  1.5*  PHOS  --   --  2.9 2.6  --  3.1   GFR Estimated Creatinine Clearance: 44.2 mL/min (by C-G formula based on SCr of 1.12 mg/dL). Liver Function Tests: Recent Labs  Lab 04/20/22 1253 04/23/22 0047  AST 28 31  ALT 12 13  ALKPHOS 61 46  BILITOT 1.6* 0.9  PROT 6.3* 6.3*  ALBUMIN 3.4* 3.2*   No results for input(s): LIPASE, AMYLASE in the last 168 hours. No results for input(s): AMMONIA in the last 168 hours. Coagulation profile Recent Labs  Lab 04/23/22 0047  INR 1.0    CBC: Recent Labs  Lab 04/20/22 1253 04/23/22 0047 04/24/22 0724  WBC 6.6 20.3* 14.5*  NEUTROABS 4.5  --   --   HGB 13.6 12.3* 11.5*  HCT 40.3 36.1* 33.6*  MCV 108.6* 108.7* 108.0*  PLT 198 165 158   Cardiac Enzymes: No results for input(s): CKTOTAL, CKMB, CKMBINDEX, TROPONINI in the last 168 hours. BNP (last 3 results) No results for input(s): PROBNP in the last 8760 hours. CBG: Recent Labs  Lab 04/23/22 0001 04/23/22 0021  GLUCAP 120* 118*   D-Dimer: No results for input(s): DDIMER in the last 72 hours. Hgb A1c: No results for input(s): HGBA1C in the last 72 hours.  Lipid Profile: No results for input(s): CHOL, HDL, LDLCALC, TRIG, CHOLHDL, LDLDIRECT in the last 72 hours.  Thyroid function studies: No results for input(s): TSH, T4TOTAL, T3FREE, THYROIDAB in the last 72 hours.  Invalid input(s): FREET3 Anemia work up: No results for input(s): VITAMINB12, FOLATE, FERRITIN, TIBC, IRON, RETICCTPCT in the last 72  hours. Sepsis Labs: Recent Labs  Lab 04/20/22 1253 04/23/22 0047 04/24/22 0724  WBC 6.6 20.3* 14.5*    Microbiology Recent Results (from the past 240 hour(s))  Urine Culture     Status: Abnormal   Collection Time: 04/20/22  3:44 PM   Specimen: Urine, Clean Catch  Result Value Ref Range Status   Specimen Description   Final    URINE, CLEAN CATCH Performed at [facility], [address] [place], [place] [address]    Special Requests   Final    NONE Performed at [facility], [address] [place], [place] [address]    Culture >=100,000 COLONIES/mL ESCHERICHIA COLI (A)  Final   Report Status 04/23/2022 FINAL  Final   Organism ID, Bacteria ESCHERICHIA COLI (A)  Final      Susceptibility   Escherichia coli - MIC*    AMPICILLIN 8 SENSITIVE Sensitive     CEFAZOLIN <=4 SENSITIVE Sensitive     CEFEPIME <=0.12 SENSITIVE Sensitive     CEFTRIAXONE <=0.25 SENSITIVE Sensitive     CIPROFLOXACIN >=4 RESISTANT Resistant     GENTAMICIN <=1 SENSITIVE Sensitive     IMIPENEM <=0.25 SENSITIVE Sensitive     NITROFURANTOIN <=16 SENSITIVE Sensitive     TRIMETH/SULFA >=320 RESISTANT Resistant     AMPICILLIN/SULBACTAM 4 SENSITIVE Sensitive     PIP/TAZO <=4 SENSITIVE Sensitive     * >=100,000 COLONIES/mL ESCHERICHIA COLI  MRSA Next Gen by PCR, Nasal     Status: None   Collection Time: 04/23/22 12:50 AM   Specimen: Nasal Mucosa; Nasal Swab  Result Value Ref Range Status   MRSA by PCR Next Gen NOT  DETECTED NOT DETECTED Final    Comment: (NOTE) The GeneXpert MRSA Assay (FDA approved for NASAL specimens only), is one component of a comprehensive MRSA colonization surveillance program. It is not intended to diagnose MRSA infection nor to guide or monitor treatment for MRSA infections. Test performance is not FDA approved in patients less than 20 years old. Performed at [facility], [address] [place], [place] [address]     Procedures and diagnostic  studies:  CT HEAD WO CONTRAST ( )  Result Date: 04/23/2022 CLINICAL DATA:  Stroke, follow up altered mental status EXAM: CT HEAD WITHOUT CONTRAST TECHNIQUE: Contiguous axial images were obtained from the base of the skull through the vertex without intravenous contrast. RADIATION DOSE REDUCTION: This exam was performed according to the departmental dose-optimization program which includes automated exposure control, adjustment of the mA and/or kV according to patient size and/or use of iterative reconstruction technique. COMPARISON:  Apr 20, 2022. FINDINGS: Brain: Multiple evolving infarcts in the left frontal and right parietal lobes, better characterized on prior MRI. No progressive mass effect or acute hemorrhage. No evidence of new/interval acute large vascular territory infarct. No hydrocephalus, mass lesion, midline shift or visible extra-axial fluid collection. Cerebral atrophy. Vascular: No hyperdense vessel identified. Skull: No acute fracture. Sinuses/Orbits: Mild paranasal sinus mucosal thickening. No acute orbital findings. Other: No mastoid effusions. IMPRESSION: Multiple evolving infarcts in the left frontal and right parietal lobes, better characterized on prior MRI. No progressive mass effect or acute hemorrhage. No evidence of new/interval acute large vascular territory infarct. Electronically Signed   By: Feliberto Harts M.D.   On: 04/23/2022 15:52   DG Chest Port 1 View  Result Date: 04/23/2022 CLINICAL DATA:  Aspiration into airway.  Hypertension EXAM: PORTABLE CHEST 1 VIEW COMPARISON:  05/11/2021 FINDINGS: Heart is normal size. Interstitial prominence throughout the lungs. Somewhat focal opacities in the right upper lobe and right lower lobe. No effusions. No acute bony abnormality. IMPRESSION: Diffuse interstitial prominence. Patchy opacities in the right lung. Cannot exclude pneumonia. Electronically Signed   By: Charlett Nose M.D.   On: 04/23/2022 01:05   ECHOCARDIOGRAM  COMPLETE  Result Date: 04/22/2022    ECHOCARDIOGRAM REPORT   Patient Name:   HAZEL WRINKLE Date of Exam: 04/22/2022 Medical Rec #:  [id number]        Height:       65.0 in Accession #:    [id number]       Weight:       140.0 lb Date of Birth:  1941-03-14         BSA:          1.700 m Patient Age:    80 years         BP:           187/88 mmHg Patient Gender: M                HR:           52 bpm. Exam Location:  [facility] Procedure: 2D Echo, Cardiac Doppler and Color Doppler Indications:     I63.9 Stroke  History:         Patient has prior history of Echocardiogram examinations.                  Arrythmias:Atrial Fibrillation; Risk Factors:Current Smoker,                  Dyslipidemia and Hypertension.  Sonographer:     Ceasar Mons Referring  Phys:  [id number] TOCHUKWU AGBATA Diagnosing Phys: Marcina MillardAlexander Paraschos MD  Sonographer Comments: Suboptimal parasternal window and suboptimal apical window. Image acquisition challenging due to uncooperative patient and Image acquisition challenging due to respiratory motion. IMPRESSIONS  1. Left ventricular ejection fraction, by estimation, is 55 to 60%. The left ventricle has normal function. The left ventricle has no regional wall motion abnormalities. Left ventricular diastolic parameters are indeterminate.  2. Right ventricular systolic function is normal. The right ventricular size is normal.  3. The mitral valve is normal in structure. Mild to moderate mitral valve regurgitation. No evidence of mitral stenosis.  4. The aortic valve is normal in structure. Aortic valve regurgitation is not visualized. No aortic stenosis is present.  5. The inferior vena cava is normal in size with greater than 50% respiratory variability, suggesting right atrial pressure of 3 mmHg. FINDINGS  Left Ventricle: Left ventricular ejection fraction, by estimation, is 55 to 60%. The left ventricle has normal function. The left ventricle has no regional wall motion abnormalities. The left ventricular  internal cavity size was normal in size. There is  no left ventricular hypertrophy. Left ventricular diastolic parameters are indeterminate. Right Ventricle: The right ventricular size is normal. No increase in right ventricular wall thickness. Right ventricular systolic function is normal. Left Atrium: Left atrial size was normal in size. Right Atrium: Right atrial size was normal in size. Pericardium: There is no evidence of pericardial effusion. Mitral Valve: The mitral valve is normal in structure. Mild to moderate mitral valve regurgitation. No evidence of mitral valve stenosis. MV peak gradient, 1.3 mmHg. The mean mitral valve gradient is 1.0 mmHg. Tricuspid Valve: The tricuspid valve is normal in structure. Tricuspid valve regurgitation is mild . No evidence of tricuspid stenosis. Aortic Valve: The aortic valve is normal in structure. Aortic valve regurgitation is not visualized. No aortic stenosis is present. Pulmonic Valve: The pulmonic valve was normal in structure. Pulmonic valve regurgitation is not visualized. No evidence of pulmonic stenosis. Aorta: The aortic root is normal in size and structure. Venous: The inferior vena cava is normal in size with greater than 50% respiratory variability, suggesting right atrial pressure of 3 mmHg. IAS/Shunts: No atrial level shunt detected by color flow Doppler.  LEFT VENTRICLE PLAX 2D LVIDd:         3.02 cm   Diastology LVIDs:         2.18 cm   LV e' medial:    4.90 cm/s LV PW:         1.15 cm   LV E/e' medial:  15.3 LV IVS:        1.47 cm   LV e' lateral:   5.00 cm/s LVOT diam:     2.20 cm   LV E/e' lateral: 15.0 LV SV:         61 LV SV Index:   36 LVOT Area:     3.80 cm  RIGHT VENTRICLE RV Basal diam:  2.88 cm RV S prime:     20.40 cm/s LEFT ATRIUM           Index        RIGHT ATRIUM           Index LA diam:      2.30 cm 1.35 cm/m   RA Area:     11.50 cm LA Vol (A4C): 64.9 ml 38.18 ml/m  RA Volume:   19.10 ml  11.24 ml/m  AORTIC VALVE  PULMONIC  VALVE LVOT Vmax:   76.10 cm/s  PV Vmax:       0.60 m/s LVOT Vmean:  43.800 cm/s PV Vmean:      46.500 cm/s LVOT VTI:    0.161 m     PV VTI:        0.167 m                          PV Peak grad:  1.4 mmHg AORTA                    PV Mean grad:  1.0 mmHg Ao Root diam: 3.50 cm  MITRAL VALVE MV Area (PHT): 3.31 cm    SHUNTS MV Area VTI:   2.02 cm    Systemic VTI:  0.16 m MV Peak grad:  1.3 mmHg    Systemic Diam: 2.20 cm MV Mean grad:  1.0 mmHg MV Vmax:       0.57 m/s MV Vmean:      45.6 cm/s MV Decel Time: 229 msec MV E velocity: 74.90 cm/s MV A velocity: 60.70 cm/s MV E/A ratio:  1.23 Marcina Millard MD Electronically signed by Marcina Millard MD Signature Date/Time: 04/22/2022/2:11:55 PM    Final    US Abdomen Limited RUQ (LIVER/GB)  Result Date: 04/23/2022 CLINICAL DATA:  Hematemesis EXAM: ULTRASOUND ABDOMEN LIMITED RIGHT UPPER QUADRANT COMPARISON:  None Available. FINDINGS: Gallbladder: No gallstones or wall thickening visualized. No sonographic Murphy sign noted by sonographer. Common bile duct: Diameter: 4 mm Liver: There is increased echogenicity in the liver. No focal abnormality is seen. Portal vein is patent on color Doppler imaging with normal direction of blood flow towards the liver. Other: None. IMPRESSION: Fatty liver. No other sonographic abnormality is seen in the right upper quadrant. Electronically Signed   By: Ernie Avena M.D.   On: 04/23/2022 11:08               LOS: 4 days   Jax Abdelrahman  [facility]   Pager on [email or web address]. If 7PM-7AM, please contact night-coverage at [email or web address]     04/24/2022, 11:38 AM

## 2022-04-24 NOTE — Consult Note (Incomplete)
Consultation Note Date: 04/24/2022   Patient Name: Richard Carter  DOB: [birth year]  MRN: [id number]  Age / Sex: 81 y.o., male  PCP: Dion Body, MD Referring Physician: Jennye Boroughs, MD  Reason for Consultation: {Reason for Consult:23484}  HPI/Patient Profile: 81 y.o. male  with past medical history of *** admitted on 04/20/2022 with ***.   Clinical Assessment and Goals of Care: ***  Primary Decision Maker {Primary Decision [id number]    SUMMARY OF RECOMMENDATIONS   ***  Code Status/Advance Care Planning: {Palliative Code status:23503}   Symptom Management:  ***  Palliative Prophylaxis:  {Palliative Prophylaxis:21015}  Additional Recommendations (Limitations, Scope, Preferences): {Recommended Scope and Preferences:21019}  Psycho-social/Spiritual:  Desire for further Chaplaincy support:{YES NO:22349} Additional Recommendations: {PAL SOCIAL:21064}  Prognosis:  {Palliative Care Prognosis:23504}  Discharge Planning: {Palliative dispostion:23505}      Primary Diagnoses: Present on Admission:  Acute CVA (cerebrovascular accident) ([place])  AF (paroxysmal atrial fibrillation) (HCC)  Dementia (HCC)  HTN (hypertension)  Alcohol abuse  Nicotine dependence  UTI (urinary tract infection)  Elevated troponin   I have reviewed the medical record, interviewed the patient and family, and examined the patient. The following aspects are pertinent.  Past Medical History:  Diagnosis Date   Alcohol abuse    Dementia ([place])    GERD (gastroesophageal reflux disease)    HLD (hyperlipidemia)    HTN (hypertension)    Seizures (HCC)    Stroke [facility])    Social History   Socioeconomic History   Marital status: Widowed    Spouse name: Not on file   Number of children: Not on file   Years of education: Not on file   Highest education level: Not on file  Occupational History   Not on file  Tobacco Use   Smoking status:  Every Day    Packs/day: 0.50    Years: 68.00    Pack years: 34.00    Types: Cigarettes   Smokeless tobacco: Never  Vaping Use   Vaping Use: Never used  Substance and Sexual Activity   Alcohol use: Yes    Comment: Drinks about 4oz of liquor daily. Last drink was yesterday 04/19/22.   Drug use: Never   Sexual activity: Not on file  Other Topics Concern   Not on file  Social History Narrative   Not on file   Social Determinants of Health   Financial Resource Strain: Not on file  Food Insecurity: Not on file  Transportation Needs: Not on file  Physical Activity: Not on file  Stress: Not on file  Social Connections: Not on file   Family History  Problem Relation Age of Onset   Stroke Mother    Cancer Brother    Scheduled Meds:  atorvastatin  80 mg Oral Daily   chlorhexidine  15 mL Mouth Rinse BID   mouth rinse  15 mL Mouth Rinse q12n4p   metoprolol succinate  12.5 mg Oral Daily   nicotine  14 mg Transdermal Daily   thiamine  100 mg Oral Daily   Or   thiamine  100 mg Intravenous Daily   traZODone  100 mg Oral QHS   Continuous Infusions:  ampicillin-sulbactam (UNASYN) IV Stopped (04/24/22 1347)   dextrose 5 % and 0.9% NaCl 75 mL/hr at 04/24/22 1505   levETIRAcetam 400 mL/hr at 04/24/22 1505   pantoprazole 8 mg/hr (04/24/22 0750)   PRN Meds:.acetaminophen **OR** acetaminophen (TYLENOL) oral liquid 160 mg/5 mL **OR** acetaminophen, bisacodyl, lip balm, metoprolol tartrate, ondansetron (ZOFRAN) IV No Known Allergies  Review of Systems  Physical Exam  Vital Signs: BP (!) 157/74 (BP Location: Left Arm)   Pulse 61   Temp 97.6 F (36.4 C) (Oral)   Resp 17   Ht 5\' 5"  (1.651 m)   Wt 63.5 kg   SpO2 100%   BMI 23.30 kg/m  Pain Scale: PAINAD POSS *See Group Information*: 1-Acceptable,Awake and alert Pain Score: 0-No pain   SpO2: SpO2: 100 % O2 Device:SpO2: 100 % O2 Flow Rate: .O2 Flow Rate (L/min): 3 L/min  IO: Intake/output summary:  Intake/Output Summary (Last  24 hours) at 04/24/2022 1550 Last data filed at 04/24/2022 1501 Gross per 24 hour  Intake 783.57 ml  Output 500 ml  Net 283.57 ml    LBM: Last BM Date : 04/20/22 Baseline Weight: Weight: 63.5 kg Most recent weight: Weight: 63.5 kg     Palliative Assessment/Data:     *Please note that this is a verbal dictation therefore any spelling or grammatical errors are due to the "[place] [employer]" system interpretation.  Time In: *** Time Out: *** Time Total: *** Greater than 50%  of this time was spent counseling and coordinating care related to the above assessment and plan.  Juel Burrow, DNP, AGNP-C Palliative Medicine Team [phone] Pager: [phone]

## 2022-04-24 NOTE — TOC Progression Note (Signed)
Transition of Care [facility]) - Progression Note    Patient Details  Name: Richard Carter MRN: [id number] Date of Birth: 1941-01-03  [facility]) CM/SW Contact  Truddie Hidden, RN Phone Number: 04/24/2022, 2:18 PM  Clinical Narrative:    Called Patient's son, Paxson Harrower to give bed offers. Patient son states he lives in [place] request [facility] be contacted with information. Contacted [facility] to give bed offers. [name] requested call back 6/1   Expected Discharge Plan: Skilled Nursing Facility Barriers to Discharge: Continued Medical Work up  Expected Discharge Plan and Services Expected Discharge Plan: Skilled Nursing Facility       Living arrangements for the past 2 months: Single Family Home                                       Social Determinants of Health (SDOH) Interventions    Readmission Risk Interventions    04/21/2022    1:55 PM  Readmission Risk Prevention Plan  Post Dischage Appt Complete  Medication Screening Complete  Transportation Screening Complete

## 2022-04-24 NOTE — Consult Note (Signed)
Consultation  Referring Provider:     Dr Retta DionesAikyu Admit date 04/20/22 Consult date        04/24/22 Reason for Consultation:     hematemesis         HPI:   Richard Carter is a 81 y.o. male with medical history significant for dementia, history of CVA with left-sided hemiparesis, GERD, alcohol abuse, PAF on eliquis, medical nonadherence, nicotine dependence, seizures who presented to ED with lethargy/mental status changes. Was admitted for acute CVA. GI was consulted due to episode of hematemesis- patient unable to give history due to his medical condition, so information obtained from chart and nurse. The episode of hematemesis occurred once very night before last, late and was a  one time event. Documented to be 50ml. He has not had any other GI issues here. Note he was started on meloxicam last month for headaches. He has not been on outpatient PPI. He has been hemodynamically stable. CBC with hgb 11.5. he has no thrombocytopenia or history of this T bili slightly elevated on admission otherwise liver enzymes have been normal this year. Pt/inr normal. US this visit with some fatty liver but no other abnormalities. Having echo today. He has been started on a Pantoprazole drip. He has been treated also for aspiration pneumonia and UTI- on unasyn. Note that palliative care is coming on board.  PREVIOUS ENDOSCOPIES:            No available reports. Declined colonoscopy 2017   Past Medical History:  Diagnosis Date   Alcohol abuse    Dementia (HCC)    GERD (gastroesophageal reflux disease)    HLD (hyperlipidemia)    HTN (hypertension)    Seizures (HCC)    Stroke [facility](HCC)     Past Surgical History:  Procedure Laterality Date   skin cyst removal      Family History  Problem Relation Age of Onset   Stroke Mother    Cancer Brother      Social History   Tobacco Use   Smoking status: Every Day    Packs/day: 0.50    Years: 68.00    Pack years: 34.00    Types: Cigarettes   Smokeless  tobacco: Never  Vaping Use   Vaping Use: Never used  Substance Use Topics   Alcohol use: Yes    Comment: Drinks about 4oz of liquor daily. Last drink was yesterday 04/19/22.   Drug use: Never    Prior to Admission medications   Medication Sig Start Date End Date Taking? Authorizing Provider  amLODipine (NORVASC) 5 MG tablet Take 1 tablet (5 mg total) by mouth daily. 05/12/21  Yes Sreenath, Jonelle SportsSudheer B, MD  apixaban (ELIQUIS) 5 MG TABS tablet Take 1 tablet (5 mg total) by mouth 2 (two) times daily. 05/11/21  Yes Sreenath, Sudheer B, MD  atorvastatin (LIPITOR) 20 MG tablet Take 20 mg by mouth daily.   Yes [provider]  levETIRAcetam (KEPPRA) 500 MG tablet Take 1 tablet (500 mg total) by mouth 2 (two) times daily. 12/01/20  Yes Danford, Earl Liteshristopher P, MD  losartan (COZAAR) 50 MG tablet Take 50 mg by mouth daily. 02/25/22  Yes [provider]  magnesium oxide (MAG-OX) 400 MG tablet Take 400 mg by mouth daily.   Yes [provider]  meloxicam (MOBIC) 7.5 MG tablet Take 7.5 mg by mouth daily as needed. 03/26/22  Yes [provider]  metoprolol succinate (TOPROL-XL) 50 MG 24 hr tablet Take 1 tablet (50 mg total) by  mouth daily. 12/01/20  Yes Danford, Earl Lites, MD  omeprazole (PRILOSEC) 40 MG capsule Take 40 mg by mouth daily.   Yes [provider]  traZODone (DESYREL) 100 MG tablet Take 1 tablet (100 mg total) by mouth at bedtime. 05/11/21  Yes Sreenath, Sudheer B, MD  potassium chloride SA (KLOR-CON) 20 MEQ tablet Take 1 tablet (20 mEq total) by mouth 2 (two) times daily for 3 days. 10/16/21 10/19/21  Georga Hacking, MD    Current Facility-Administered Medications  Medication Dose Route Frequency Provider Last Rate Last Admin   acetaminophen (TYLENOL) tablet 650 mg  650 mg Oral Q4H PRN Agbata, Tochukwu, MD       Or   acetaminophen (TYLENOL) 160 MG/5ML solution 650 mg  650 mg Per Tube Q4H PRN Agbata, Tochukwu, MD       Or   acetaminophen (TYLENOL)  suppository 650 mg  650 mg Rectal Q4H PRN Agbata, Tochukwu, MD       Ampicillin-Sulbactam (UNASYN) 3 g in sodium chloride 0.9 % 100 mL IVPB  3 g Intravenous Q6H Lurene Shadow, MD       atorvastatin (LIPITOR) tablet 80 mg  80 mg Oral Daily Agbata, Tochukwu, MD   80 mg at 04/21/22 1100   bisacodyl (DULCOLAX) suppository 10 mg  10 mg Rectal Daily PRN Lurene Shadow, MD       chlorhexidine (PERIDEX) 0.12 % solution 15 mL  15 mL Mouth Rinse BID Lurene Shadow, MD   15 mL at 04/24/22 1123   dextrose 5 %-0.9 % sodium chloride infusion   Intravenous Continuous Lurene Shadow, MD 75 mL/hr at 04/24/22 1220 New Bag at 04/24/22 1220   levETIRAcetam (KEPPRA) IVPB 500 mg/100 mL premix  500 mg Intravenous Q12H Lurene Shadow, MD 400 mL/hr at 04/24/22 1245 500 mg at 04/24/22 1245   lip balm (BLISTEX) ointment   Topical PRN Lurene Shadow, MD       MEDLINE mouth rinse  15 mL Mouth Rinse q12n4p Lurene Shadow, MD       metoprolol succinate (TOPROL-XL) 24 hr tablet 12.5 mg  12.5 mg Oral Daily Hall, Carole N, DO   12.5 mg at 04/22/22 1324   metoprolol tartrate (LOPRESSOR) injection 2.5 mg  2.5 mg Intravenous Q6H PRN Lurene Shadow, MD       nicotine (NICODERM CQ - dosed in mg/24 hours) patch 14 mg  14 mg Transdermal Daily Agbata, Tochukwu, MD   14 mg at 04/24/22 1123   ondansetron (ZOFRAN) injection 4 mg  4 mg Intravenous Q6H PRN Foust, Katy L, NP       pantoprozole (PROTONIX) 80 mg /NS 100 mL infusion  8 mg/hr Intravenous Continuous Foust, Katy L, NP 10 mL/hr at 04/24/22 0750 8 mg/hr at 04/24/22 0750   thiamine tablet 100 mg  100 mg Oral Daily Agbata, Tochukwu, MD   100 mg at 04/21/22 1101   Or   thiamine (B-1) injection 100 mg  100 mg Intravenous Daily Agbata, Tochukwu, MD   100 mg at 04/24/22 1121   traZODone (DESYREL) tablet 100 mg  100 mg Oral QHS Agbata, Tochukwu, MD   100 mg at 04/21/22 2217    Allergies as of 04/20/2022   (No Known Allergies)     Review of Systems:    All systems reviewed and negative  except where noted in HPI.      Physical Exam:  Vital signs in last 24 hours: Temp:  [96.8 F (36 C)-98.2 F (36.8 C)] 97.6 F (36.4 C) (  05/31 1130) Pulse Rate:  [57-108] 61 (05/31 1130) Resp:  [16-20] 17 (05/31 1130) BP: (137-170)/(52-79) 157/74 (05/31 1130) SpO2:  [89 %-100 %] 100 % (05/31 1130) Last BM Date : 04/20/22 General:   Somnolent elderly man who appears chronically ill in NAD Head:  Normocephalic and atraumatic. Eyes:   No icterus.   Conjunctiva pink. Ears:  Normal auditory acuity. Mouth: Mucosa pink moist, no lesions. Neck:  Supple; no masses felt Lungs:  Respirations even and unlabored. Lungs clear to auscultation bilaterally.   No wheezes, crackles, or rhonchi.  Heart:  S1S2, RRR, no MRG. No edema. Abdomen:   Flat, soft, nondistended, nontender. Normal bowel sounds. No appreciable masses or hepatomegaly. No rebound signs or other peritoneal signs. Rectal:  Not performed.  Msk:  MAE weakly. There is gross motor movement of RUE,and normal but weak mvt of lue. There is lower extremity weakness , No clubbing or cyanosis.  Symmetrical without gross deformities. Neurologic:  Awakens to verbal with light touch. Significant dysarthria. Difficulty staying alert at the time of exam/interview.there is slight facial droop to the right.  Follows a few commands.   Skin:  Warm, dry, pink without significant lesions or rashes. Psych:  Drowsy  LAB RESULTS: Recent Labs    04/23/22 0047 04/24/22 0724  WBC 20.3* 14.5*  HGB 12.3* 11.5*  HCT 36.1* 33.6*  PLT 165 158   BMET Recent Labs    04/22/22 0356 04/23/22 0047 04/24/22 0724  NA 137 139 138  K 4.6 4.4 4.1  CL 101 105 105  CO2 27 25 24   GLUCOSE 109* 112* 73  BUN 33* 37* 36*  CREATININE 1.21 1.14 1.12  CALCIUM 9.5 9.4 9.3   LFT Recent Labs    04/23/22 0047  PROT 6.3*  ALBUMIN 3.2*  AST 31  ALT 13  ALKPHOS 46  BILITOT 0.9   PT/INR Recent Labs    04/23/22 0047  LABPROT 13.2  INR 1.0    STUDIES: CT  HEAD WO CONTRAST (04/25/22)  Result Date: 04/23/2022 CLINICAL DATA:  Stroke, follow up altered mental status EXAM: CT HEAD WITHOUT CONTRAST TECHNIQUE: Contiguous axial images were obtained from the base of the skull through the vertex without intravenous contrast. RADIATION DOSE REDUCTION: This exam was performed according to the departmental dose-optimization program which includes automated exposure control, adjustment of the mA and/or kV according to patient size and/or use of iterative reconstruction technique. COMPARISON:  Apr 20, 2022. FINDINGS: Brain: Multiple evolving infarcts in the left frontal and right parietal lobes, better characterized on prior MRI. No progressive mass effect or acute hemorrhage. No evidence of new/interval acute large vascular territory infarct. No hydrocephalus, mass lesion, midline shift or visible extra-axial fluid collection. Cerebral atrophy. Vascular: No hyperdense vessel identified. Skull: No acute fracture. Sinuses/Orbits: Mild paranasal sinus mucosal thickening. No acute orbital findings. Other: No mastoid effusions. IMPRESSION: Multiple evolving infarcts in the left frontal and right parietal lobes, better characterized on prior MRI. No progressive mass effect or acute hemorrhage. No evidence of new/interval acute large vascular territory infarct. Electronically Signed   By: Apr 22, 2022 M.D.   On: 04/23/2022 15:52   DG Chest Port 1 View  Result Date: 04/23/2022 CLINICAL DATA:  Aspiration into airway.  Hypertension EXAM: PORTABLE CHEST 1 VIEW COMPARISON:  05/11/2021 FINDINGS: Heart is normal size. Interstitial prominence throughout the lungs. Somewhat focal opacities in the right upper lobe and right lower lobe. No effusions. No acute bony abnormality. IMPRESSION: Diffuse interstitial prominence. Patchy opacities in the right  lung. Cannot exclude pneumonia. Electronically Signed   By: Charlett Nose M.D.   On: 04/23/2022 01:05   US Abdomen Limited RUQ  (LIVER/GB)  Result Date: 04/23/2022 CLINICAL DATA:  Hematemesis EXAM: ULTRASOUND ABDOMEN LIMITED RIGHT UPPER QUADRANT COMPARISON:  None Available. FINDINGS: Gallbladder: No gallstones or wall thickening visualized. No sonographic Murphy sign noted by sonographer. Common bile duct: Diameter: 4 mm Liver: There is increased echogenicity in the liver. No focal abnormality is seen. Portal vein is patent on color Doppler imaging with normal direction of blood flow towards the liver. Other: None. IMPRESSION: Fatty liver. No other sonographic abnormality is seen in the right upper quadrant. Electronically Signed   By: Ernie Avena M.D.   On: 04/23/2022 11:08       Impression / Plan:   Hematemesis- single episode, small volume. Do acknowledge his history of etoh abuse and although it is likely he does have some degree of liver fibrosis he has no over signs of cirrhosis- pt/inr is normal, platelet count is normal; there is no nodular or shrunken contour of liver, etc. Note he was started on nsaid recently and has been on eliquis (although adherence is unknown), and was admitted with acute cva in setting of other cvas, ICA stenosis, and PAF. Ddx also includes pud, gastritis, hemoptysis, other. For now no sedated procedures recommended but do agree with PPI and following hemoglobins (his is stable). Agree with palliative care consult to establish goals of care in the medically complex patient  Thank you very much for this consult. These services were provided by Vevelyn Pat, NP-C, in collaboration with Regis Bill MD, with whom I have discussed this patient in full.   Vevelyn Pat, NP-C

## 2022-04-24 NOTE — Plan of Care (Signed)
  Problem: Education: Goal: Knowledge of secondary prevention will improve (SELECT ALL) Outcome: Progressing Goal: Knowledge of patient specific risk factors will improve (INDIVIDUALIZE FOR PATIENT) Outcome: Progressing   Problem: Ischemic Stroke/TIA Tissue Perfusion: Goal: Complications of ischemic stroke/TIA will be minimized Outcome: Progressing   Problem: Education: Goal: Knowledge of General Education information will improve Description: Including pain rating scale, medication(s)/side effects and non-pharmacologic comfort measures Outcome: Progressing   Problem: Clinical Measurements: Goal: Ability to maintain clinical measurements within normal limits will improve Outcome: Progressing   Problem: Activity: Goal: Risk for activity intolerance will decrease Outcome: Progressing   Problem: Safety: Goal: Ability to remain free from injury will improve Outcome: Progressing   Problem: Skin Integrity: Goal: Risk for impaired skin integrity will decrease Outcome: Progressing

## 2022-04-25 ENCOUNTER — Inpatient Hospital Stay: Payer: Self-pay

## 2022-04-25 DIAGNOSIS — I639 Cerebral infarction, unspecified: Secondary | ICD-10-CM | POA: Diagnosis not present

## 2022-04-25 DIAGNOSIS — Z515 Encounter for palliative care: Secondary | ICD-10-CM | POA: Diagnosis not present

## 2022-04-25 DIAGNOSIS — Z7189 Other specified counseling: Secondary | ICD-10-CM | POA: Diagnosis not present

## 2022-04-25 DIAGNOSIS — Z66 Do not resuscitate: Secondary | ICD-10-CM | POA: Diagnosis not present

## 2022-04-25 LAB — CBC
HCT: 32 % — ABNORMAL LOW (ref 39.0–52.0)
Hemoglobin: 11 g/dL — ABNORMAL LOW (ref 13.0–17.0)
MCH: 37 pg — ABNORMAL HIGH (ref 26.0–34.0)
MCHC: 34.4 g/dL (ref 30.0–36.0)
MCV: 107.7 fL — ABNORMAL HIGH (ref 80.0–100.0)
Platelets: 168 10*3/uL (ref 150–400)
RBC: 2.97 MIL/uL — ABNORMAL LOW (ref 4.22–5.81)
RDW: 18.2 % — ABNORMAL HIGH (ref 11.5–15.5)
WBC: 9.9 10*3/uL (ref 4.0–10.5)
nRBC: 0 % (ref 0.0–0.2)

## 2022-04-25 LAB — BASIC METABOLIC PANEL
Anion gap: 5 (ref 5–15)
BUN: 23 mg/dL (ref 8–23)
CO2: 24 mmol/L (ref 22–32)
Calcium: 9.1 mg/dL (ref 8.9–10.3)
Chloride: 111 mmol/L (ref 98–111)
Creatinine, Ser: 0.88 mg/dL (ref 0.61–1.24)
GFR, Estimated: >60 mL/min (ref >60)
Glucose, Bld: 100 mg/dL — ABNORMAL HIGH (ref 70–99)
Potassium: 3.7 mmol/L (ref 3.5–5.1)
Sodium: 140 mmol/L (ref 135–145)

## 2022-04-25 LAB — MAGNESIUM: Magnesium: 1.6 mg/dL — ABNORMAL LOW (ref 1.7–2.4)

## 2022-04-25 MED ORDER — SODIUM CHLORIDE 0.9% FLUSH
10.0000 mL | Freq: Two times a day (BID) | INTRAVENOUS | Status: DC
  Administered 2022-04-25 – 2022-04-28 (×8): 10 mL
  Administered 2022-04-29: 30 mL

## 2022-04-25 MED ORDER — MAGNESIUM SULFATE 4 GM/100ML IV SOLN
4.0000 g | Freq: Once | INTRAVENOUS | Status: AC
  Administered 2022-04-25: 4 g via INTRAVENOUS
  Filled 2022-04-25: qty 100

## 2022-04-25 MED ORDER — FOOD THICKENER (SIMPLYTHICK)
1.0000 | ORAL | Status: DC | PRN
  Filled 2022-04-25: qty 1

## 2022-04-25 MED ORDER — SODIUM CHLORIDE 0.9% FLUSH: 10.0000 mL | INTRAVENOUS | Status: DC | PRN

## 2022-04-25 MED ORDER — CHLORHEXIDINE GLUCONATE CLOTH 2 % EX PADS
6.0000 | MEDICATED_PAD | Freq: Every day | CUTANEOUS | Status: DC
  Administered 2022-04-26 – 2022-04-29 (×4): 6 via TOPICAL

## 2022-04-25 NOTE — TOC Progression Note (Addendum)
Transition of Care [facility]) - Progression Note    Patient Details  Name: Richard Carter MRN: [id number] Date of Birth: [birth year]  [facility] CM/SW Contact  Maree Krabbe, LCSW Phone Number: 04/25/2022, 3:56 PM  Clinical Narrative:   Per CSW previous note pt's son ask that we contact [name] and provide bed offers. [name].   Pt is managed by [facility]. CSW asked Eunice Blase at [facility] to start auth today.     Expected Discharge Plan: Skilled Nursing Facility Barriers to Discharge: Continued Medical Work up  Expected Discharge Plan and Services Expected Discharge Plan: Skilled Nursing Facility       Living arrangements for the past 2 months: Single Family Home                                       Social Determinants of Health (SDOH) Interventions    Readmission Risk Interventions    04/21/2022    1:55 PM  Readmission Risk Prevention Plan  Post Dischage Appt Complete  Medication Screening Complete  Transportation Screening Complete

## 2022-04-25 NOTE — Progress Notes (Signed)
Iv consult entered. Attempted restart but was unable to obtain. Patient has protonix drip going through one line and needs a second line for fluids/antibiotics. Second line was discontinued by patient accidentally when techs went to change him this morning.

## 2022-04-25 NOTE — Progress Notes (Signed)
PROGRESS NOTE  Richard Carter HKV:[id number] DOB: 07-Mar-1941 DOA: 04/20/2022 PCP: Marisue Ivan, MD  Brief History   Richard Carter is a 81 y.o. male with medical history significant for dementia, history of stroke with left-sided hemiparesis, seizure disorder, hypertension, GERD, alcohol use disorder, tobacco use disorder, who was brought to the hospital for evaluation of altered mental status (lethargy) and difficulty speaking.   He was found to have acute embolic stroke, severe left ICA stenosis and acute UTI.  Treatment with aspirin for 5 days followed by resumption of Eliquis was recommended.  He was treated with IV Rocephin for UTI.  Unfortunately, patient developed acute GI bleeding/hematemesis so aspirin was held.  He developed aspiration pneumonia which was treated with antibiotics as well.  He was given IV fluids for dehydration and poor oral intake.  He had hypokalemia and hypomagnesemia that were repleted.  Plan is for discharge to rehab.  Consultants  Palliative care Gastroenterology Neurology  Procedures  None  Antibiotics   Anti-infectives (From admission, onward)    Start     Dose/Rate Route Frequency Ordered Stop   04/24/22 1300  Ampicillin-Sulbactam (UNASYN) 3 g in sodium chloride 0.9 % 100 mL IVPB        3 g 200 mL/hr over 30 Minutes Intravenous Every 6 hours 04/24/22 1206     04/23/22 1000  cefTRIAXone (ROCEPHIN) 1 g in sodium chloride 0.9 % 100 mL IVPB  Status:  Discontinued        1 g 200 mL/hr over 30 Minutes Intravenous Every 24 hours 04/23/22 0029 04/23/22 0103   04/23/22 1000  cefTRIAXone (ROCEPHIN) 2 g in sodium chloride 0.9 % 100 mL IVPB  Status:  Discontinued        2 g 200 mL/hr over 30 Minutes Intravenous Every 24 hours 04/23/22 0103 04/24/22 1206   04/23/22 0115  Ampicillin-Sulbactam (UNASYN) 3 g in sodium chloride 0.9 % 100 mL IVPB  Status:  Discontinued        3 g 200 mL/hr over 30 Minutes Intravenous Every 6 hours 04/23/22 0029 04/23/22  0103   04/20/22 1700  cefTRIAXone (ROCEPHIN) 1 g in sodium chloride 0.9 % 100 mL IVPB        1 g 200 mL/hr over 30 Minutes Intravenous Every 24 hours 04/20/22 1636 04/23/22 0913       Subjective  The patient is resting comfortably. No new complaints.  Objective   Vitals:  Vitals:   04/25/22 1135 04/25/22 1644  BP: (!) 142/66 (!) 135/99  Pulse: 64 (!) 56  Resp: 17 19  Temp: 97.8 F (36.6 C) (!) 97.3 F (36.3 C)  SpO2: 95% 100%    Exam:  Constitutional:  Appears calm and comfortable Respiratory:  CTA bilaterally, no w/r/r.  Respiratory effort normal. No retractions or accessory muscle use Cardiovascular:  RRR, no m/r/g No LE extremity edema   Normal pedal pulses Abdomen:  Abdomen appears normal; no tenderness or masses No hernias No HSM Musculoskeletal:  Digits/nails BUE: no clubbing, cyanosis, petechiae, infection exam of joints, bones, muscles of at least one of following: head/neck, RUE, LUE, RLE, LLE   strength and tone normal, no atrophy, no abnormal movements No tenderness, masses Normal ROM, no contractures  gait and station Skin:  No rashes, lesions, ulcers palpation of skin: no induration or nodules Neurologic:  CN 2-12 intact Sensation all 4 extremities intact  I have personally reviewed the following:   Today's Data  Vitals  Lab Data  BMP CBC  Micro Data  Urine culture is positive for E. coli  Imaging  CT head CXR MRI brain Abdominal ultrasound  Cardiology Data  EKG Echocardiogram  Other Data    Scheduled Meds:  amLODipine  5 mg Oral Daily   atorvastatin  80 mg Oral Daily   chlorhexidine  15 mL Mouth Rinse BID   Chlorhexidine Gluconate Cloth  6 each Topical Daily   feeding supplement (NEPRO CARB STEADY)  237 mL Oral TID BM   losartan  50 mg Oral Daily   mouth rinse  15 mL Mouth Rinse q12n4p   metoprolol succinate  12.5 mg Oral Daily   multivitamin with minerals  1 tablet Oral Daily   nicotine  14 mg Transdermal Daily    sodium chloride flush  10-40 mL Intracatheter Q12H   thiamine  100 mg Oral Daily   Or   thiamine  100 mg Intravenous Daily   traZODone  100 mg Oral QHS   Continuous Infusions:  ampicillin-sulbactam (UNASYN) IV 3 g (04/25/22 1309)   levETIRAcetam 500 mg (04/25/22 1344)   pantoprazole 8 mg/hr (04/25/22 0653)    Principal Problem:   Acute CVA (cerebrovascular accident) (HCC) Active Problems:   UTI (urinary tract infection)   AF (paroxysmal atrial fibrillation) (HCC)   Non compliance w medication regimen   Seizures (HCC)   HTN (hypertension)   Dementia (HCC)   Alcohol abuse   Nicotine dependence   Elevated troponin   Aspiration pneumonia (HCC)   LOS: 5 days   Acute stroke, suspect embolic stroke, severe left ICA stenosis (70%):  Patient wants to be treated with aspirin for 5 days and resume Eliquis but aspirin has been held because of acute GI bleeding. 2D echo showed EF estimated at 55 to 60%, mild to moderate MR. PT and OT as able.  Outpatient follow-up with neurologist and vascular surgeon.   Altered mental status: Improving.  Likely from acute stroke.  Repeat CT head confirmed embolic stroke.  Paroxysmal atrial fibrillation: Eliquis on hold.     Dysphagia, dehydration and poor oral intake: Restart IV fluids.  Speech therapist recommended dysphagia 1 diet.   Aspiration pneumonia and E. coli UTI: Continue IV Rocephin.  Follow-up with speech therapist for swallow evaluation.   Acute GI Bleeding/ hematemesis: H&H is stable.  Continue IV Protonix.  Aspirin still on hold.  Consulted Dr. Mia Creek, gastroenterologist, to assist with management.  Recommendation is for protonix. There are no plans for any endoscopic procedures as risks outweigh benefits given the patient's clinical condition.  Hypokalemia and hypomagnesemia: Replete magnesium with IV magnesium sulfate.  Potassium level is okay. Continue to monitor.  Alcohol use disorder: Use Ativan only as needed per CIWA protocol.     Hypertension: Use IV metoprolol as needed for severe hypertension.  Restart amlodipine and losartan.  Other comorbidities include seizure disorder, dementia, tobacco use disorder, medical nonadherence   Palliative care has evaluated patient. The patient and family has expressed their desire to continue current measures and allow time for outcomes to be observed. He is DNR. Plan is for discharge to rehab. Insurance authorization is being sought.  DVT Prophylaxis: SCD's CODE STATUS: DNR Family Communication: None available Disposition: Rehab  Fran Lowes, DO [facility] April 25, 2022; 5:59

## 2022-04-25 NOTE — Progress Notes (Signed)
Peripherally Inserted Central Catheter Placement  The IV Nurse has discussed with the patient and/or persons authorized to consent for the patient, the purpose of this procedure and the potential benefits and risks involved with this procedure.  The benefits include less needle sticks, lab draws from the catheter, and the patient may be discharged home with the catheter. Risks include, but not limited to, infection, bleeding, blood clot (thrombus formation), and puncture of an artery; nerve damage and irregular heartbeat and possibility to perform a PICC exchange if needed/ordered by physician.  Alternatives to this procedure were also discussed.  Bard Power PICC patient education guide, fact sheet on infection prevention and patient information card has been provided to patient /or left at bedside.    PICC Placement Documentation  PICC Double Lumen 04/25/22 Right Basilic 35 cm 0 cm (Active)  Indication for Insertion or Continuance of Line Poor Vasculature-patient has had multiple peripheral attempts or PIVs lasting less than 24 hours 04/25/22 1220  Exposed Catheter (cm) 0 cm 04/25/22 1220  Site Assessment Clean, Dry, Intact 04/25/22 1220  Lumen #1 Status Flushed;Blood return noted;Saline locked 04/25/22 1220  Lumen #2 Status Flushed;Blood return noted;Saline locked 04/25/22 1220  Dressing Type Transparent 04/25/22 1220  Dressing Status Antimicrobial disc in place 04/25/22 1220  Dressing Change Due 05/02/22 04/25/22 1220   Spoke to son Boykin Nearing regarding PICC and stated to to talked and get consent to Renaldo Reel.Spoke to sister of the pt while brother is listening the risk and benefits of PICC and they are agreeable.PICC is inserted successfully and patient tolerated well.    Conrad [place] Ramos 04/25/2022, 12:40 PM

## 2022-04-25 NOTE — Plan of Care (Signed)
  Problem: Ischemic Stroke/TIA Tissue Perfusion: Goal: Complications of ischemic stroke/TIA will be minimized 04/25/2022 0505 by Rosalie Gums, RN Outcome: Progressing 04/25/2022 0502 by Rosalie Gums, RN Outcome: Progressing   Problem: Education: Goal: Knowledge of General Education information will improve Description: Including pain rating scale, medication(s)/side effects and non-pharmacologic comfort measures 04/25/2022 0505 by Rosalie Gums, RN Outcome: Progressing 04/25/2022 0502 by Rosalie Gums, RN Outcome: Progressing   Problem: Clinical Measurements: Goal: Ability to maintain clinical measurements within normal limits will improve 04/25/2022 0505 by Rosalie Gums, RN Outcome: Progressing 04/25/2022 0502 by Rosalie Gums, RN Outcome: Progressing   Problem: Activity: Goal: Risk for activity intolerance will decrease 04/25/2022 0505 by Rosalie Gums, RN Outcome: Progressing 04/25/2022 0502 by Rosalie Gums, RN Outcome: Progressing   Problem: Safety: Goal: Ability to remain free from injury will improve 04/25/2022 0505 by Rosalie Gums, RN Outcome: Progressing 04/25/2022 0502 by Rosalie Gums, RN Outcome: Progressing   Problem: Skin Integrity: Goal: Risk for impaired skin integrity will decrease 04/25/2022 0505 by Rosalie Gums, RN Outcome: Progressing 04/25/2022 0502 by Rosalie Gums, RN Outcome: Progressing

## 2022-04-25 NOTE — Progress Notes (Signed)
OT Cancellation Note  Patient Details Name: Richard Carter MRN: [id number] DOB: [birth year]   Cancelled Treatment:    Reason Eval/Treat Not Completed: Fatigue/lethargy limiting ability to participate. Upon attempt, pt sleeping, requires verbal/tactile cues to improve alertness. Difficulty keeping eyes open, mumbles, unable to comprehend. Required MAX A to attempt to use washcloth. Repositioned for improved safety, joint alignment, and skin integrity. Will re-attempt next date for OT re-evaluation.   Arman Filter., MPH, MS, OTR/L ascom [phone] 04/25/22, 2:45 PM

## 2022-04-25 NOTE — Progress Notes (Signed)
Daily Progress Note   Patient Name: Richard Carter       Date: 04/25/2022 DOB: 30-Oct-1941  Age: 81 y.o. MRN#: [id number] Attending Physician: Fran Lowes, DO Primary Care Physician: Marisue Ivan, MD Admit Date: 04/20/2022  Reason for Consultation/Follow-up: Establishing goals of care  Subjective: Patient sleeping, does not respond to verbal stimulation RN reports he was slightly agitated this morning attempting to get out of bed, ate breakfast with assistance this a.m.  Length of Stay: 5  Current Medications: Scheduled Meds:   amLODipine  5 mg Oral Daily   atorvastatin  80 mg Oral Daily   chlorhexidine  15 mL Mouth Rinse BID   Chlorhexidine Gluconate Cloth  6 each Topical Daily   feeding supplement (NEPRO CARB STEADY)  237 mL Oral TID BM   losartan  50 mg Oral Daily   mouth rinse  15 mL Mouth Rinse q12n4p   metoprolol succinate  12.5 mg Oral Daily   multivitamin with minerals  1 tablet Oral Daily   nicotine  14 mg Transdermal Daily   sodium chloride flush  10-40 mL Intracatheter Q12H   thiamine  100 mg Oral Daily   Or   thiamine  100 mg Intravenous Daily   traZODone  100 mg Oral QHS    Continuous Infusions:  ampicillin-sulbactam (UNASYN) IV 3 g (04/25/22 1309)   levETIRAcetam 500 mg (04/25/22 1344)   pantoprazole 8 mg/hr (04/25/22 0653)    PRN Meds: acetaminophen **OR** acetaminophen (TYLENOL) oral liquid 160 mg/5 mL **OR** acetaminophen, bisacodyl, food thickener, lip balm, metoprolol tartrate, ondansetron (ZOFRAN) IV, sodium chloride flush  Physical Exam Constitutional:      General: He is not in acute distress.    Appearance: He is ill-appearing.     Comments: Mostly lethargic  Pulmonary:     Effort: Pulmonary effort is normal.  Skin:    General: Skin is warm and dry.   Neurological:     Mental Status: He is disoriented.            Vital Signs: BP (!) 142/66 (BP Location: Left Arm)   Pulse 64   Temp 97.8 F (36.6 C)   Resp 17   Ht 5\' 5"  (1.651 m)   Wt 63.5 kg   SpO2 95%   BMI 23.30 kg/m  SpO2: SpO2: 95 % O2 Device: O2 Device: Room Air O2 Flow Rate: O2 Flow Rate (L/min): 3 L/min  Intake/output summary:  Intake/Output Summary (Last 24 hours) at 04/25/2022 1415 Last data filed at 04/25/2022 1100 Gross per 24 hour  Intake 2733.59 ml  Output 1650 ml  Net 1083.59 ml   LBM: Last BM Date : 04/20/22 Baseline Weight: Weight: 63.5 kg Most recent weight: Weight: 63.5 kg       Palliative Assessment/Data: PPS 20%    Flowsheet Rows    Flowsheet Row Most Recent Value  Intake Tab   Referral Department Hospitalist  Unit at Time of Referral Med/Surg Unit  Palliative Care Primary Diagnosis Neurology  Date Notified 04/24/22  Palliative Care Type New Palliative care  Reason for referral Clarify Goals of Care  Date of Admission 04/20/22  Date first seen by Palliative Care 04/24/22  # of days Palliative  referral response time 0 Day(s)  # of days IP prior to Palliative referral 4  Clinical Assessment   Psychosocial & Spiritual Assessment   Palliative Care Outcomes        Patient Active Problem List   Diagnosis Date Noted   Aspiration pneumonia (HCC) 04/23/2022   Nicotine dependence    Elevated troponin    Acute CVA (cerebrovascular accident) (HCC) 05/07/2021   Non compliance w medication regimen 05/07/2021   Dementia (HCC) 05/07/2021   AF (paroxysmal atrial fibrillation) (HCC) 05/07/2021   HTN (hypertension) 11/29/2020   Alcohol abuse 11/29/2020   AKI (acute kidney injury) (HCC) 11/29/2020   Atrial flutter with rapid ventricular response (HCC) 11/29/2020   Seizures (HCC) 02/22/2020   Pubic ramus fracture (HCC) 07/29/2019   Acetabular fracture (HCC) 07/29/2019   HLD (hyperlipidemia) 07/29/2019   GERD (gastroesophageal reflux disease)  07/29/2019   UTI (urinary tract infection) 07/29/2019    Palliative Care Assessment & Plan   HPI: 81 y.o. male  with past medical history of dementia, history of stroke with left-sided hemiparesis, seizure disorder, hypertension, GERD, alcohol use disorder, and tobacco use disorder admitted on 04/20/2022 with AMS.  Found to have acute embolic stroke.  Also found to have UTI.  Treated with anticoagulants and IV antibiotics.  Unfortunately he developed acute GI bleed.  Also developed aspiration pneumonia.  Poor p.o. intake continues along with increased aspiration risk.  PMT consulted to discuss goals of care.   Assessment: Follow-up today with patient at bedside x2.  During my interactions with him he is poorly responsive however nurse and family both report he responds to them intermittently.  Nurse reports he ate breakfast. Family requesting thickener so they can give him Ensure. Family asking about new baseline -we discussed can be months following a stroke before we know new baseline but likely will be worsened than prior status. Family shares goals for patient to go to SNF -specifically interested in [facility]. Called patient's son Roe Coombs who lives in [place] and also provided update.  All questions and concerns answered.  Recommendations/Plan: Continue current measures and allow time for outcomes Family updated on situation Some improvement in mental status and p.o. intake today Family planning for rehab placement at discharge -[facility] for [facility]  Goals of Care and Additional Recommendations: Limitations on Scope of Treatment: No Artificial Feeding  Code Status: DNR  Discharge Planning: Skilled Nursing Facility for rehab with Palliative care service follow-up  Care plan was discussed with brother, caregiver, son Roe Coombs, RN  Thank you for allowing the Palliative Medicine Team to assist in the care of this patient.   *Please note that this is a verbal dictation  therefore any spelling or grammatical errors are due to the "[employer]" system interpretation.  Gerlean Ren, DNP, [facility] Palliative Medicine Team Team Phone # [phone]  Pager [phone]

## 2022-04-25 NOTE — Plan of Care (Signed)
  Problem: Education: Goal: Knowledge of secondary prevention will improve (SELECT ALL) Outcome: Progressing Goal: Knowledge of patient specific risk factors will improve (INDIVIDUALIZE FOR PATIENT) Outcome: Progressing   Problem: Ischemic Stroke/TIA Tissue Perfusion: Goal: Complications of ischemic stroke/TIA will be minimized Outcome: Progressing   Problem: Education: Goal: Knowledge of General Education information will improve Description: Including pain rating scale, medication(s)/side effects and non-pharmacologic comfort measures Outcome: Progressing   Problem: Clinical Measurements: Goal: Ability to maintain clinical measurements within normal limits will improve Outcome: Progressing   Problem: Activity: Goal: Risk for activity intolerance will decrease Outcome: Progressing   Problem: Safety: Goal: Ability to remain free from injury will improve Outcome: Progressing   Problem: Skin Integrity: Goal: Risk for impaired skin integrity will decrease Outcome: Progressing   

## 2022-04-25 NOTE — Evaluation (Signed)
Physical Therapy Re-Evaluation Patient Details Name: Richard Carter MRN: [id number] DOB: June 25, 1941 Today's Date: 04/25/2022  History of Present Illness  Pt is an 81 y.o. male presenting to hospital 5/27 with dysarthria and expressive aphasia.  Imaging showing "Acute infarcts in the posterior left frontal cortex and subcortical white matter, medial right parietal cortex and subcortical white matter, and right frontal white matter, with additional small subacute infarcts in the bilateral cerebellar hemispheres".  Pt admitted with acute CVA, UTI, a-fib, and h/o medication non-compliance.  Pt transferred to CCU 5/30 d/t medical concerns.  Pt developed acute GI bleeding/hematemesis and aspiration PNA; also hypokalemia and hypomagnesemia.  New therapy orders received.  PMH includes alcohol abuse, dementia, htn, seizures, stroke (with L sided hemiparesis), a-fib, AKI, pubic ramus and acetabular fx 2020, and UTI.  Clinical Impression  PT re-evaluation performed.  Prior to hospital admission, per pt's son (on initial evaluation) pt was ambulatory (using RW or holding onto walls within home to walk); lives alone in 1 level home with 2 STE B railings; has neighbor who is his caretaker and checks on pt during the day.  Pt was ambulatory on initial evaluation (5/28) with assist but pt unable walk today with assist.  Pt was sleeping upon therapy entering pt's room but woken with vc's and tactile cues.  During session pt mod to max assist with bed mobility; intermittent assist for sitting balance; and min to mod assist x2 to stand up to RW and control descent sitting (pt only able to briefly stand before needing to sit and pt wanting to lay back down in bed).  Pt's speech difficult to understand during session.  Pt would benefit from skilled PT to address noted impairments and functional limitations (see below for any additional details).  Upon hospital discharge, pt would benefit from SNF.  PT POC reviewed and remains  appropriate.     Recommendations for follow up therapy are one component of a multi-disciplinary discharge planning process, led by the attending physician.  Recommendations may be updated based on patient status, additional functional criteria and insurance authorization.  Follow Up Recommendations Skilled nursing-short term rehab (<3 hours/day)    Assistance Recommended at Discharge Frequent or constant Supervision/Assistance  Patient can return home with the following  Two people to help with walking and/or transfers;Two people to help with bathing/dressing/bathroom;Assistance with cooking/housework;Direct supervision/assist for medications management;Direct supervision/assist for financial management;Assist for transportation;Help with stairs or ramp for entrance    Equipment Recommendations Rolling walker (2 wheels);BSC/3in1  Recommendations for Other Services  OT consult    Functional Status Assessment Patient has had a recent decline in their functional status and demonstrates the ability to make significant improvements in function in a reasonable and predictable amount of time.     Precautions / Restrictions Precautions Precautions: Fall Precaution Comments: Seizure precautions Restrictions Weight Bearing Restrictions: No      Mobility  Bed Mobility Overal bed mobility: Needs Assistance Bed Mobility: Supine to Sit, Sit to Supine     Supine to sit: Mod assist, Max assist, HOB elevated (assist for trunk and B LE's) Sit to supine: Mod assist, HOB elevated (assist for B LE's)   General bed mobility comments: vc's for technique    Transfers Overall transfer level: Needs assistance Equipment used: Rolling walker (2 wheels) Transfers: Sit to/from Stand Sit to Stand: Min assist, Mod assist, +2 physical assistance           General transfer comment: hand over hand assist for UE placement; assist  to initiate stand and control descent sitting    Ambulation/Gait                General Gait Details: pt unable to stand long enough to attempt  Stairs            Wheelchair Mobility    Modified Rankin (Stroke Patients Only)       Balance Overall balance assessment: Needs assistance Sitting-balance support: Bilateral upper extremity supported, Feet supported Sitting balance-Leahy Scale: Poor Sitting balance - Comments: intermittent posterior lean requiring assist for upright balance   Standing balance support: Bilateral upper extremity supported, During functional activity Standing balance-Leahy Scale: Poor Standing balance comment: assist for balance standing with RW use                             Pertinent Vitals/Pain Pain Assessment Pain Assessment: Faces Faces Pain Scale: No hurt Pain Intervention(s): Limited activity within patient's tolerance, Monitored during session, Repositioned Vitals (HR and O2 on room air) stable and WFL throughout treatment session.    Home Living Family/patient expects to be discharged to:: Skilled nursing facility Living Arrangements: Alone (caretaker (neighbor) lives next door) Available Help at Discharge: Neighbor;Available PRN/intermittently;Family Type of Home: Mobile home Home Access: Stairs to enter Entrance Stairs-Rails: Doctor, general practice of Steps: 2   Home Layout: One level Home Equipment: Grab bars - tub/shower;Shower seat;Grab bars - toilet;Rolling Walker (2 wheels)      Prior Function Prior Level of Function : Needs assist             Mobility Comments: Modified independent ambulating with RW or holding onto walls when ambulating within home.  No recent falls reports ADLs Comments: Assist for medications and meals; gets assisted with bath every Sunday.     Hand Dominance        Extremity/Trunk Assessment   Upper Extremity Assessment Upper Extremity Assessment: Defer to OT evaluation;Difficult to assess due to impaired cognition    Lower  Extremity Assessment Lower Extremity Assessment: Difficult to assess due to impaired cognition (at least 3/5 AROM B LE knee extension; at least 3/5 AROM R DF; at least 2/5 L DF)    Cervical / Trunk Assessment Cervical / Trunk Assessment: Other exceptions Cervical / Trunk Exceptions: forward head/shoulders  Communication   Communication: Expressive difficulties  Cognition Arousal/Alertness:  (pt sleeping soundly upon therapy arrival but woken with vc's and tactile cues) Behavior During Therapy: Flat affect Overall Cognitive Status: Difficult to assess                                 General Comments: Pt's speech very difficult to understand; pt did not answer cognitive questions        General Comments  Nursing cleared pt for participation in physical therapy.    Exercises  Transfer training   Assessment/Plan    PT Assessment Patient needs continued PT services  PT Problem List Decreased strength;Decreased activity tolerance;Decreased balance;Decreased mobility;Decreased knowledge of use of DME;Decreased safety awareness;Decreased knowledge of precautions       PT Treatment Interventions DME instruction;Gait training;Stair training;Functional mobility training;Therapeutic activities;Therapeutic exercise;Balance training;Patient/family education    PT Goals (Current goals can be found in the Care Plan section)  Acute Rehab PT Goals Patient Stated Goal: to improve mobility PT Goal Formulation: With patient/family Time For Goal Achievement: 05/05/22 Potential to Achieve Goals: Fair  Frequency 7X/week     Co-evaluation               AM-PAC PT "6 Clicks" Mobility  Outcome Measure Help needed turning from your back to your side while in a flat bed without using bedrails?: None Help needed moving from lying on your back to sitting on the side of a flat bed without using bedrails?: A Lot Help needed moving to and from a bed to a chair (including a  wheelchair)?: Total Help needed standing up from a chair using your arms (e.g., wheelchair or bedside chair)?: Total Help needed to walk in hospital room?: Total Help needed climbing 3-5 steps with a railing? : Total 6 Click Score: 10    End of Session Equipment Utilized During Treatment: Gait belt Activity Tolerance: Patient limited by fatigue Patient left: in bed;with call bell/phone within reach;with bed alarm set;with SCD's reapplied Nurse Communication: Mobility status;Precautions PT Visit Diagnosis: Unsteadiness on feet (R26.81);Other abnormalities of gait and mobility (R26.89);Muscle weakness (generalized) (M62.81)    Time: [id number] PT Time Calculation (min) (ACUTE ONLY): 20 min   Charges:   PT Evaluation $PT Re-evaluation: 1 Re-eval PT Treatments $Therapeutic Activity: 8-22 mins       Hendricks LimesEmily Nea Gittens, PT 04/25/22, 11:49 AM

## 2022-04-25 NOTE — Consult Note (Addendum)
Consultation Note Date: 04/25/2022   Patient Name: Richard Carter  DOB: 06-Dec-1940  MRN: [id number]  Age / Sex: 81 y.o., male  PCP: Richard Body, MD Referring Physician: Karie Kirks, DO  Reason for Consultation: Establishing goals of care  HPI/Patient Profile: 81 y.o. male  with past medical history of dementia, history of stroke with left-sided hemiparesis, seizure disorder, hypertension, GERD, alcohol use disorder, and tobacco use disorder admitted on 04/20/2022 with AMS.  Found to have acute embolic stroke.  Also found to have UTI.  Treated with anticoagulants and IV antibiotics.  Unfortunately he developed acute GI bleed.  Also developed aspiration pneumonia.  Poor p.o. intake continues along with increased aspiration risk.  PMT consulted to discuss goals of care.  Clinical Assessment and Goals of Care: I have reviewed medical records including EPIC notes, labs and imaging, received report from RN, assessed the patient and then met with family at bedside  to discuss diagnosis prognosis, GOC, EOL wishes, disposition and options.  Patient's sister and brother-in-law at bedside.  They share that patient has 2 sons-1 in [place] and one in [place].  Her sister tells me there has been a change in the patient over the past month, more withdrawn, declining function.  She also reports impaired cognition.  She tells me patient has a caregiver that lives across the road that helps with most daily cares.  Sister tells me they are interested in rehab placement.  I later called patient's son Richard Carter who lives in [place].  I introduced Palliative Medicine as specialized medical care for people living with serious illness. It focuses on providing relief from the symptoms and stress of a serious illness. The goal is to improve quality of life for both the patient and the family.  He tells me he is healthcare power of attorney.  He also reports patient has declining  but tells me its been much longer than a month, he has been declining for a while.  He tells me of patient's recent complications and that he is actually surprised patient is doing as well as he is as he expected patient would have passed away given all the complications.  We reviewed clinical course and plan of care moving forward.  We reviewed patient's poor p.o. intake and ongoing altered mental status.  We reviewed seeing how he does over the next few days as we continue to provide him support.  Richard Carter is clear patient would not want artificial nutrition.  He asks what it would look like to focus on comfort and so we discussed this as well.  Discussed with family the importance of continued conversation with family and the medical providers regarding overall plan of care and treatment options, ensuring decisions are within the context of the patients values and GOCs.    Questions and concerns were addressed. The family was encouraged to call with questions or concerns.  Primary Decision Maker NEXT OF KIN - 2 sons - son Richard Carter self reports as HCPOA   SUMMARY OF RECOMMENDATIONS   - family updated, allow time for outcomes - Richard Carter shares patient would not want feeding tube if intake does not improve - Richard Carter asks what comfort measures looks like, we discussed this could be considered with further decline  Code Status/Advance Care Planning: DNR      Primary Diagnoses: Present on Admission:  Acute CVA (cerebrovascular accident) ([place])  AF (paroxysmal atrial fibrillation) ([place])  Dementia ([place])  HTN (hypertension)  Alcohol abuse  Nicotine dependence  UTI (urinary  tract infection)  Elevated troponin   I have reviewed the medical record, interviewed the patient and family, and examined the patient. The following aspects are pertinent.  Past Medical History:  Diagnosis Date   Alcohol abuse    Dementia ([place])    GERD (gastroesophageal reflux disease)    HLD (hyperlipidemia)    HTN  (hypertension)    Seizures (HCC)    Stroke [facility])    Social History   Socioeconomic History   Marital status: Widowed    Spouse name: Not on file   Number of children: Not on file   Years of education: Not on file   Highest education level: Not on file  Occupational History   Not on file  Tobacco Use   Smoking status: Every Day    Packs/day: 0.50    Years: 68.00    Pack years: 34.00    Types: Cigarettes   Smokeless tobacco: Never  Vaping Use   Vaping Use: Never used  Substance and Sexual Activity   Alcohol use: Yes    Comment: Drinks about 4oz of liquor daily. Last drink was yesterday 04/19/22.   Drug use: Never   Sexual activity: Not on file  Other Topics Concern   Not on file  Social History Narrative   Not on file   Social Determinants of Health   Financial Resource Strain: Not on file  Food Insecurity: Not on file  Transportation Needs: Not on file  Physical Activity: Not on file  Stress: Not on file  Social Connections: Not on file   Family History  Problem Relation Age of Onset   Stroke Mother    Cancer Brother    Scheduled Meds:  amLODipine  5 mg Oral Daily   atorvastatin  80 mg Oral Daily   chlorhexidine  15 mL Mouth Rinse BID   feeding supplement (NEPRO CARB STEADY)  237 mL Oral TID BM   losartan  50 mg Oral Daily   mouth rinse  15 mL Mouth Rinse q12n4p   metoprolol succinate  12.5 mg Oral Daily   multivitamin with minerals  1 tablet Oral Daily   nicotine  14 mg Transdermal Daily   thiamine  100 mg Oral Daily   Or   thiamine  100 mg Intravenous Daily   traZODone  100 mg Oral QHS   Continuous Infusions:  ampicillin-sulbactam (UNASYN) IV 3 g (04/25/22 0740)   dextrose 5 % and 0.9% NaCl 75 mL/hr at 04/25/22 0121   levETIRAcetam 500 mg (04/25/22 0038)   pantoprazole 8 mg/hr (04/25/22 0653)   PRN Meds:.acetaminophen **OR** acetaminophen (TYLENOL) oral liquid 160 mg/5 mL **OR** acetaminophen, bisacodyl, lip balm, metoprolol tartrate, ondansetron  (ZOFRAN) IV No Known Allergies Review of Systems  Unable to perform ROS: Mental status change  All other systems reviewed and are negative.  Physical Exam Constitutional:      General: He is not in acute distress.    Appearance: He is ill-appearing.  Pulmonary:     Effort: Pulmonary effort is normal.  Skin:    General: Skin is warm and dry.    Vital Signs: BP 114/85 (BP Location: Right Arm)   Pulse 62   Temp 98.1 F (36.7 C)   Resp 17   Ht '5\' 5"'  (1.651 m)   Wt 63.5 kg   SpO2 96%   BMI 23.30 kg/m  Pain Scale: PAINAD POSS *See Group Information*: 1-Acceptable,Awake and alert Pain Score: 0-No pain   SpO2: SpO2: 96 % O2 Device:SpO2: 96 %  O2 Flow Rate: .O2 Flow Rate (L/min): 3 L/min  IO: Intake/output summary:  Intake/Output Summary (Last 24 hours) at 04/25/2022 0857 Last data filed at 04/25/2022 0600 Gross per 24 hour  Intake 1115.87 ml  Output 1750 ml  Net -634.13 ml     LBM: Last BM Date : 04/20/22 Baseline Weight: Weight: 63.5 kg Most recent weight: Weight: 63.5 kg     Palliative Assessment/Data: PPS 20%     *Please note that this is a verbal dictation therefore any spelling or grammatical errors are due to the "[employer]" system interpretation.  Juel Burrow, DNP, AGNP-C Palliative Medicine Team [phone] Pager: [phone]

## 2022-04-26 DIAGNOSIS — F0390 Unspecified dementia without behavioral disturbance: Secondary | ICD-10-CM

## 2022-04-26 DIAGNOSIS — I639 Cerebral infarction, unspecified: Secondary | ICD-10-CM | POA: Diagnosis not present

## 2022-04-26 DIAGNOSIS — Z66 Do not resuscitate: Secondary | ICD-10-CM

## 2022-04-26 DIAGNOSIS — Z515 Encounter for palliative care: Secondary | ICD-10-CM | POA: Diagnosis not present

## 2022-04-26 DIAGNOSIS — Z7189 Other specified counseling: Secondary | ICD-10-CM | POA: Diagnosis not present

## 2022-04-26 LAB — BASIC METABOLIC PANEL
Anion gap: 3 — ABNORMAL LOW (ref 5–15)
BUN: 24 mg/dL — ABNORMAL HIGH (ref 8–23)
CO2: 26 mmol/L (ref 22–32)
Calcium: 8.8 mg/dL — ABNORMAL LOW (ref 8.9–10.3)
Chloride: 114 mmol/L — ABNORMAL HIGH (ref 98–111)
Creatinine, Ser: 0.95 mg/dL (ref 0.61–1.24)
GFR, Estimated: >60 mL/min (ref >60)
Glucose, Bld: 88 mg/dL (ref 70–99)
Potassium: 3.4 mmol/L — ABNORMAL LOW (ref 3.5–5.1)
Sodium: 143 mmol/L (ref 135–145)

## 2022-04-26 LAB — MAGNESIUM: Magnesium: 1.9 mg/dL (ref 1.7–2.4)

## 2022-04-26 NOTE — Progress Notes (Signed)
Patient has been more restless and agitated throughout the night shift. Attempted to redirect, frequently round and gave night meds. Patient continued to pull at [facility] line and external catheter. Pulled external catheter off and urinated in bed. Bed alarm frequently goes off. Charge nurse placed mittens which were also removed by patient. Contacted NP Randol Kern and received sitter order. Notified charge nurse for sitter assignment.

## 2022-04-26 NOTE — Evaluation (Addendum)
Occupational Therapy Re-Evaluation Patient Details Name: Richard Carter MRN: [id number] DOB: 01-Feb-1941 Today's Date: 04/26/2022   History of Present Illness Pt is an 81 y.o. male presenting to hospital 5/27 with dysarthria and expressive aphasia.  Imaging showing "Acute infarcts in the posterior left frontal cortex and subcortical white matter, medial right parietal cortex and subcortical white matter, and right frontal white matter, with additional small subacute infarcts in the bilateral cerebellar hemispheres".  Pt admitted with acute CVA, UTI, a-fib, and h/o medication non-compliance.  Pt transferred to CCU 5/30 d/t medical concerns.  Pt developed acute GI bleeding/hematemesis and aspiration PNA; also hypokalemia and hypomagnesemia.  New therapy orders received.  PMH includes alcohol abuse, dementia, htn, seizures, stroke (with L sided hemiparesis), a-fib, AKI, pubic ramus and acetabular fx 2020, and UTI.   Clinical Impression   Pt. presents with expressive aphasia, RUE weakness, generalized weakness, limited activity tolerance, and limited functional mobility which hinder his ability to complete basic ADL and IADL functioning. Per pt.'s personal caregiver pt.  Was able to feed himself, and required assist with set-up of clothing for morning ADLs. Pt. Required assist for showering, meal preparation, medication management, and household management tasks. Pt. does not drive. Pt. Was assisted with complete meal tray set-up. Pt. required mod-maxA hand over hand assist to hold a standard spoon, scoop from a bowl, and perform hand to mouth patterns. Pt. Required mod-maxA hand-over hand assist to hold and drink a beverage using the right hand. Pt. Fatigues with self-feeding. Pt./caregiver education was provided about the posted swallowing precautions. Pt. Required increased time to complete self-feeding, and will require assist from staff, and caregivers for self-feeding skills as current self-feeding tasks  reflect a change from the initial OT evaluation. Pt. Will continue to benefit from OT services for ADL training, A/E training, neuro muscular re-education, there. Ex., and pt. education about home modification, and DME. Pt will benefit from follow-up OT services upon discharge.       Recommendations for follow up therapy are one component of a multi-disciplinary discharge planning process, led by the attending physician.  Recommendations may be updated based on patient status, additional functional criteria and insurance authorization.   Follow Up Recommendations  Skilled nursing-short term rehab (<3 hours/day)    Assistance Recommended at Discharge Frequent or constant Supervision/Assistance  Patient can return home with the following A little help with walking and/or transfers;A lot of help with bathing/dressing/bathroom;Assistance with cooking/housework;Direct supervision/assist for medications management;Help with stairs or ramp for entrance;Assist for transportation;Direct supervision/assist for financial management;Assistance with feeding    Functional Status Assessment  Patient has had a recent decline in their functional status and demonstrates the ability to make significant improvements in function in a reasonable and predictable amount of time.  Equipment Recommendations       Recommendations for Other Services       Precautions / Restrictions        Mobility Bed Mobility Overal bed mobility: Needs Assistance Bed Mobility: Supine to Sit, Sit to Supine     Supine to sit: Mod assist, Max assist, HOB elevated Sit to supine: Mod assist, HOB elevated        Transfers   Equipment used: Rolling walker (2 wheels) Transfers: Sit to/from Stand Sit to Stand: Min assist, Mod assist, +2 physical assistance           General transfer comment: Mobility per PT      Balance  ADL either performed or assessed with  clinical judgement   ADL   Eating/Feeding: Moderate assistance;Maximal assistance Eating/Feeding Details (indicate cue type and reason): Pt. required mod-maxA hand over hand assist to hold the spoon, perfrom scooping action, and perfrom the hand to mouth pattern. Pt. required assist to hold the beverage, and bring it to his mouth. Grooming: Moderate assistance                                       Vision Patient Visual Report: No change from baseline       Perception     Praxis      Pertinent Vitals/Pain Pain Assessment Pain Assessment: Faces Faces Pain Scale: No hurt     Hand Dominance Right   Extremity/Trunk Assessment Upper Extremity Assessment Upper Extremity Assessment: Generalized weakness           Communication Communication Communication: Expressive difficulties   Cognition Arousal/Alertness: Awake/alert Behavior During Therapy: Flat affect Overall Cognitive Status: Difficult to assess                                 General Comments: Expressive Aphasia     General Comments       Exercises     Shoulder Instructions      Home Living Family/patient expects to be discharged to:: Skilled nursing facility Living Arrangements: Alone (Caretaker resides next door, has supportive family) Available Help at Discharge: Neighbor;Available PRN/intermittently;Family Type of Home: Mobile home Home Access: Stairs to enter Entrance Stairs-Number of Steps: 2 Entrance Stairs-Rails: Right;Left Home Layout: One level     Bathroom Shower/Tub: Tub/shower unit         Home Equipment: Grab bars - tub/shower;Shower seat;Grab bars - toilet;Rolling Walker (2 wheels)          Prior Functioning/Environment Prior Level of Function : Needs assist             Mobility Comments: Modified independent ambulating with RW or holding onto walls when ambulating within home. ADLs Comments: Caregiver provided Assist for medication  management, and meal perparation; Assist with showers,and morning ADLs,Caregivers set clothes out for the pt. Pt. does not drive.        OT Problem List: Decreased strength;Decreased activity tolerance;Decreased safety awareness;Impaired balance (sitting and/or standing);Decreased knowledge of use of DME or AE;Decreased knowledge of precautions;Decreased cognition;Decreased coordination;Cardiopulmonary status limiting activity      OT Treatment/Interventions: Self-care/ADL training;Balance training;Therapeutic exercise;Neuromuscular education;Therapeutic activities;DME and/or AE instruction;Patient/family education;Manual therapy;Energy conservation    OT Goals(Current goals can be found in the care plan section) Acute Rehab OT Goals Patient Stated Goal: To go to rehab OT Goal Formulation: With patient/family Potential to Achieve Goals: Good ADL Goals Pt Will Perform Eating: with modified independence Pt Will Perform Grooming: with modified independence  OT Frequency: Min 2X/week    Co-evaluation              AM-PAC OT "6 Clicks" Daily Activity     Outcome Measure Help from another person eating meals?: A Lot   Help from another person toileting, which includes using toliet, bedpan, or urinal?: A Lot Help from another person bathing (including washing, rinsing, drying)?: A Lot Help from another person to put on and taking off regular upper body clothing?: A Lot Help from another person to put on and taking off regular lower body  clothing?: A Lot 6 Click Score: 10   End of Session    Activity Tolerance: Patient limited by fatigue Patient left: in bed;with call bell/phone within reach;with bed alarm set;with family/visitor present  OT Visit Diagnosis: Unsteadiness on feet (R26.81);Repeated falls (R29.6);Muscle weakness (generalized) (M62.81)                Time: [id number] OT Time Calculation (min): 45 min Charges:  OT General Charges $OT Visit: 1 Visit OT Evaluation $OT  Re-eval: 1 Re-eval OT Treatments $Self Care/Home Management : 8-22 mins  Harrel Carina, MS, OTR/L   Harrel Carina 04/26/2022, 12:00 PM

## 2022-04-26 NOTE — Care Management Important Message (Signed)
Important Message  Patient Details  Name: Richard Carter MRN: [id number] Date of Birth: Nov 18, 1941   Medicare Important Message Given:  Yes  Reviewed Medicare IM with son, Richard Carter, at [phone].  Copy of Medicare IM left in room for reference.     Johnell Comings 04/26/2022, 12:26 PM

## 2022-04-26 NOTE — Progress Notes (Signed)
Physical Therapy Treatment Patient Details Name: Richard Carter MRN: [id number] DOB: Apr 20, 1941 Today's Date: 04/26/2022   History of Present Illness Pt is an 81 y.o. male presenting to hospital 5/27 with dysarthria and expressive aphasia.  Imaging showing "Acute infarcts in the posterior left frontal cortex and subcortical white matter, medial right parietal cortex and subcortical white matter, and right frontal white matter, with additional small subacute infarcts in the bilateral cerebellar hemispheres".  Pt admitted with acute CVA, UTI, a-fib, and h/o medication non-compliance.  Pt transferred to CCU 5/30 d/t medical concerns.  Pt developed acute GI bleeding/hematemesis and aspiration PNA; also hypokalemia and hypomagnesemia.  New therapy orders received.  PMH includes alcohol abuse, dementia, htn, seizures, stroke (with L sided hemiparesis), a-fib, AKI, pubic ramus and acetabular fx 2020, and UTI.    PT Comments    Pt resting in bed upon PT arrival; agreeable to PT session.  During session pt min to mod assist with bed mobility; SBA with sitting balance; min to mod assist x2 for transfers using RW; assist for standing balance (pt pushing B LE's against bed to stabilize in standing); and min assist x2 to ambulate a couple short steps forward and backwards with RW use.  Difficult to understand pt's speech during session.  Pt incontinent of urine (bed wet even though pt with male external catheter) so entire bed linens changed and nurse and NT notified of pt's need of a bath and new male external catheter).  Will continue to focus on strengthening, balance, and progressive functional mobility during hospitalization.    Recommendations for follow up therapy are one component of a multi-disciplinary discharge planning process, led by the attending physician.  Recommendations may be updated based on patient status, additional functional criteria and insurance authorization.  Follow Up Recommendations   Skilled nursing-short term rehab (<3 hours/day)     Assistance Recommended at Discharge Frequent or constant Supervision/Assistance  Patient can return home with the following Two people to help with walking and/or transfers;Two people to help with bathing/dressing/bathroom;Assistance with cooking/housework;Direct supervision/assist for medications management;Direct supervision/assist for financial management;Assist for transportation;Help with stairs or ramp for entrance   Equipment Recommendations  Rolling walker (2 wheels);BSC/3in1;Wheelchair (measurements PT);Wheelchair cushion (measurements PT)    Recommendations for Other Services       Precautions / Restrictions Precautions Precautions: Fall Precaution Comments: Seizure precautions; aspiration; R PICC line Restrictions Weight Bearing Restrictions: No     Mobility  Bed Mobility Overal bed mobility: Needs Assistance Bed Mobility: Supine to Sit, Sit to Supine, Rolling Rolling: Supervision (vc's for technique)   Supine to sit: Min assist, Mod assist (assist to scoot to edge of bed) Sit to supine: Supervision (2 assist to boost pt up in bed end of session with use of bed pad)   General bed mobility comments: vc's for technique    Transfers Overall transfer level: Needs assistance Equipment used: Rolling walker (2 wheels) Transfers: Sit to/from Stand Sit to Stand: Min assist, Mod assist, +2 physical assistance           General transfer comment: vc's for UE placement; assist to initiate stand up to RW and control descent sitting    Ambulation/Gait Ambulation/Gait assistance: Min assist, +2 physical assistance Gait Distance (Feet):  (2 steps forward and backwards) Assistive device: Rolling walker (2 wheels)   Gait velocity: decreased     General Gait Details: pt with very short steps forwards/backwards; assist for balance; slightly flexed posture   Stairs  Wheelchair Mobility    Modified  Rankin (Stroke Patients Only)       Balance Overall balance assessment: Needs assistance Sitting-balance support: Single extremity supported, Feet supported Sitting balance-Leahy Scale: Fair Sitting balance - Comments: steady static sitting   Standing balance support: Bilateral upper extremity supported, Reliant on assistive device for balance Standing balance-Leahy Scale: Poor Standing balance comment: pt initially pushing B LE's against bed for balance requiring cueing for posture/balance                            Cognition Arousal/Alertness: Awake/alert Behavior During Therapy: Flat affect Overall Cognitive Status: Difficult to assess (Oriented to at least name and DOB)                                 General Comments: difficult to understand pt's speech        Exercises      General Comments  Nursing cleared pt for participation in physical therapy.  Pt agreeable to PT session.      Pertinent Vitals/Pain Pain Assessment Pain Assessment: Faces Faces Pain Scale: No hurt Pain Intervention(s): Limited activity within patient's tolerance, Monitored during session, Repositioned Vitals (HR and O2 on room air) stable and WFL throughout treatment session.    Home Living        Prior Function            PT Goals (current goals can now be found in the care plan section) Acute Rehab PT Goals Patient Stated Goal: to improve mobility PT Goal Formulation: With patient/family Time For Goal Achievement: 05/05/22 Potential to Achieve Goals: Fair Progress towards PT goals: Progressing toward goals    Frequency    7X/week      PT Plan Current plan remains appropriate    Co-evaluation              AM-PAC PT "6 Clicks" Mobility   Outcome Measure  Help needed turning from your back to your side while in a flat bed without using bedrails?: None Help needed moving from lying on your back to sitting on the side of a flat bed without  using bedrails?: A Lot Help needed moving to and from a bed to a chair (including a wheelchair)?: Total Help needed standing up from a chair using your arms (e.g., wheelchair or bedside chair)?: Total Help needed to walk in hospital room?: Total Help needed climbing 3-5 steps with a railing? : Total 6 Click Score: 10    End of Session Equipment Utilized During Treatment: Gait belt Activity Tolerance: Patient limited by fatigue Patient left: in bed;with call bell/phone within reach;with bed alarm set;Other (comment) (Tele sitter in place; fall mat in place) Nurse Communication: Mobility status;Precautions PT Visit Diagnosis: Unsteadiness on feet (R26.81);Other abnormalities of gait and mobility (R26.89);Muscle weakness (generalized) (M62.81)     Time: 9485-4627 PT Time Calculation (min) (ACUTE ONLY): 32 min  Charges:  $Therapeutic Activity: 23-37 mins                     Hendricks Limes, PT 04/26/22, 2:07 PM

## 2022-04-26 NOTE — Progress Notes (Signed)
PROGRESS NOTE  Richard Carter IBB:[id number] DOB: 08-27-1941 DOA: 04/20/2022 PCP: Marisue Ivan, MD  Brief History   Richard Carter is a 81 y.o. male with medical history significant for dementia, history of stroke with left-sided hemiparesis, seizure disorder, hypertension, GERD, alcohol use disorder, tobacco use disorder, who was brought to the hospital for evaluation of altered mental status (lethargy) and difficulty speaking.   He was found to have acute embolic stroke, severe left ICA stenosis and acute UTI.  Treatment with aspirin for 5 days followed by resumption of Eliquis was recommended.  He was treated with IV Rocephin for UTI.  Unfortunately, patient developed acute GI bleeding/hematemesis so aspirin was held.  He developed aspiration pneumonia which was treated with antibiotics as well.  He was given IV fluids for dehydration and poor oral intake.  He had hypokalemia and hypomagnesemia that were repleted.  Plan is for discharge to rehab.  Consultants  Palliative care Gastroenterology Neurology  Procedures  None  Antibiotics   Anti-infectives (From admission, onward)    Start     Dose/Rate Route Frequency Ordered Stop   04/24/22 1300  Ampicillin-Sulbactam (UNASYN) 3 g in sodium chloride 0.9 % 100 mL IVPB        3 g 200 mL/hr over 30 Minutes Intravenous Every 6 hours 04/24/22 1206     04/23/22 1000  cefTRIAXone (ROCEPHIN) 1 g in sodium chloride 0.9 % 100 mL IVPB  Status:  Discontinued        1 g 200 mL/hr over 30 Minutes Intravenous Every 24 hours 04/23/22 0029 04/23/22 0103   04/23/22 1000  cefTRIAXone (ROCEPHIN) 2 g in sodium chloride 0.9 % 100 mL IVPB  Status:  Discontinued        2 g 200 mL/hr over 30 Minutes Intravenous Every 24 hours 04/23/22 0103 04/24/22 1206   04/23/22 0115  Ampicillin-Sulbactam (UNASYN) 3 g in sodium chloride 0.9 % 100 mL IVPB  Status:  Discontinued        3 g 200 mL/hr over 30 Minutes Intravenous Every 6 hours 04/23/22 0029 04/23/22  0103   04/20/22 1700  cefTRIAXone (ROCEPHIN) 1 g in sodium chloride 0.9 % 100 mL IVPB        1 g 200 mL/hr over 30 Minutes Intravenous Every 24 hours 04/20/22 1636 04/23/22 0913       Subjective  The patient is resting comfortably. No new complaints.  Objective   Vitals:  Vitals:   04/26/22 1148 04/26/22 1708  BP: (!) 181/75 (!) 160/98  Pulse: 64 78  Resp: 16 19  Temp: 97.8 F (36.6 C) 97.6 F (36.4 C)  SpO2: 100% 100%    Exam:  Constitutional:  Appears calm and comfortable Respiratory:  CTA bilaterally, no w/r/r.  Respiratory effort normal. No retractions or accessory muscle use Cardiovascular:  RRR, no m/r/g No LE extremity edema   Normal pedal pulses Abdomen:  Abdomen appears normal; no tenderness or masses No hernias No HSM Musculoskeletal:  Digits/nails BUE: no clubbing, cyanosis, petechiae, infection exam of joints, bones, muscles of at least one of following: head/neck, RUE, LUE, RLE, LLE   strength and tone normal, no atrophy, no abnormal movements No tenderness, masses Normal ROM, no contractures  gait and station Skin:  No rashes, lesions, ulcers palpation of skin: no induration or nodules Neurologic:  CN 2-12 intact Sensation all 4 extremities intact  I have personally reviewed the following:   Today's Data  Vitals  Lab Data  BMP CBC  Micro Data  Urine culture is positive for E. coli  Imaging  CT head CXR MRI brain Abdominal ultrasound  Cardiology Data  EKG Echocardiogram  Other Data    Scheduled Meds:  amLODipine  5 mg Oral Daily   atorvastatin  80 mg Oral Daily   chlorhexidine  15 mL Mouth Rinse BID   Chlorhexidine Gluconate Cloth  6 each Topical Daily   feeding supplement (NEPRO CARB STEADY)  237 mL Oral TID BM   losartan  50 mg Oral Daily   mouth rinse  15 mL Mouth Rinse q12n4p   metoprolol succinate  12.5 mg Oral Daily   multivitamin with minerals  1 tablet Oral Daily   nicotine  14 mg Transdermal Daily   sodium  chloride flush  10-40 mL Intracatheter Q12H   thiamine  100 mg Oral Daily   Or   thiamine  100 mg Intravenous Daily   traZODone  100 mg Oral QHS   Continuous Infusions:  ampicillin-sulbactam (UNASYN) IV Stopped (04/26/22 1443)   levETIRAcetam Stopped (04/26/22 1224)    Principal Problem:   Acute CVA (cerebrovascular accident) (HCC) Active Problems:   UTI (urinary tract infection)   AF (paroxysmal atrial fibrillation) (HCC)   Non compliance w medication regimen   Seizures (HCC)   HTN (hypertension)   Dementia (HCC)   Alcohol abuse   Nicotine dependence   Elevated troponin   Aspiration pneumonia (HCC)   LOS: 6 days   Acute stroke, suspect embolic stroke, severe left ICA stenosis (70%):  Patient wants to be treated with aspirin for 5 days and resume Eliquis but aspirin has been held because of acute GI bleeding. 2D echo showed EF estimated at 55 to 60%, mild to moderate MR. PT and OT as able.  Outpatient follow-up with neurologist and vascular surgeon.   Altered mental status: Improving.  Likely from acute stroke.  Repeat CT head confirmed embolic stroke.  Paroxysmal atrial fibrillation: Eliquis on hold.     Dysphagia, dehydration and poor oral intake: Restart IV fluids.  Speech therapist recommended dysphagia 1 diet.   Aspiration pneumonia and E. coli UTI: Continue IV Rocephin.  Follow-up with speech therapist for swallow evaluation.   Acute GI Bleeding/ hematemesis: H&H is stable.  Continue IV Protonix.  Aspirin still on hold.  Consulted Dr. Mia Creek, gastroenterologist, to assist with management.  Recommendation is for protonix. There are no plans for any endoscopic procedures as risks outweigh benefits given the patient's clinical condition.  Hypokalemia and hypomagnesemia: Replete magnesium with IV magnesium sulfate.  Potassium level is okay. Continue to monitor.  Alcohol use disorder: Use Ativan only as needed per CIWA protocol.    Hypertension: Use IV metoprolol as  needed for severe hypertension.  Restart amlodipine and losartan.  Other comorbidities include seizure disorder, dementia, tobacco use disorder, medical nonadherence   Palliative care has evaluated patient. The patient and family has expressed their desire to continue current measures and allow time for outcomes to be observed. He is DNR. Plan is for discharge to rehab. Insurance authorization is being sought.  DVT Prophylaxis: SCD's CODE STATUS: DNR Family Communication: None available Disposition: Rehab  Fran Lowes, DO [facility] April 26, 2022; 5:20 PM

## 2022-04-26 NOTE — Plan of Care (Signed)
  Problem: Education: Goal: Knowledge of secondary prevention will improve (SELECT ALL) Outcome: Progressing Goal: Knowledge of patient specific risk factors will improve (INDIVIDUALIZE FOR PATIENT) Outcome: Progressing   Problem: Ischemic Stroke/TIA Tissue Perfusion: Goal: Complications of ischemic stroke/TIA will be minimized Outcome: Progressing   Problem: Education: Goal: Knowledge of General Education information will improve Description: Including pain rating scale, medication(s)/side effects and non-pharmacologic comfort measures Outcome: Progressing   Problem: Clinical Measurements: Goal: Ability to maintain clinical measurements within normal limits will improve Outcome: Progressing   Problem: Activity: Goal: Risk for activity intolerance will decrease Outcome: Progressing   Problem: Safety: Goal: Ability to remain free from injury will improve Outcome: Progressing   Problem: Skin Integrity: Goal: Risk for impaired skin integrity will decrease Outcome: Progressing

## 2022-04-26 NOTE — Progress Notes (Signed)
Daily Progress Note   Patient Name: Richard Carter       Date: 04/26/2022 DOB: [birth year]  Age: 81 y.o. MRN#: [id number] Attending Physician: Fran Lowes, DO Primary Care Physician: Marisue Ivan, MD Admit Date: 04/20/2022  Reason for Consultation/Follow-up: Establishing goals of care  Subjective: Patient sitting up working with occupational therapy.  Feeding himself with assistance.  Caregiver [name] at bedside.  Patient appears much more alert today but unable to hold conversation with me.  Length of Stay: 6  Current Medications: Scheduled Meds:   amLODipine  5 mg Oral Daily   atorvastatin  80 mg Oral Daily   chlorhexidine  15 mL Mouth Rinse BID   Chlorhexidine Gluconate Cloth  6 each Topical Daily   feeding supplement (NEPRO CARB STEADY)  237 mL Oral TID BM   losartan  50 mg Oral Daily   mouth rinse  15 mL Mouth Rinse q12n4p   metoprolol succinate  12.5 mg Oral Daily   multivitamin with minerals  1 tablet Oral Daily   nicotine  14 mg Transdermal Daily   sodium chloride flush  10-40 mL Intracatheter Q12H   thiamine  100 mg Oral Daily   Or   thiamine  100 mg Intravenous Daily   traZODone  100 mg Oral QHS    Continuous Infusions:  ampicillin-sulbactam (UNASYN) IV Stopped (04/26/22 3474)   levETIRAcetam Stopped (04/26/22 0014)    PRN Meds: acetaminophen **OR** acetaminophen (TYLENOL) oral liquid 160 mg/5 mL **OR** acetaminophen, bisacodyl, food thickener, lip balm, metoprolol tartrate, ondansetron (ZOFRAN) IV, sodium chloride flush  Physical Exam Constitutional:      General: He is not in acute distress.    Appearance: He is ill-appearing.  Pulmonary:     Effort: Pulmonary effort is normal.  Skin:    General: Skin is warm and dry.  Neurological:     Mental Status: He is alert.  He is disoriented.            Vital Signs: BP (!) 160/88 (BP Location: Left Arm)   Pulse (!) 56   Temp 97.7 F (36.5 C) (Oral)   Resp 16   Ht 5\' 5"  (1.651 m)   Wt 63.5 kg   SpO2 98%   BMI 23.30 kg/m  SpO2: SpO2: 98 % O2 Device: O2 Device: Room Air O2 Flow Rate: O2 Flow Rate (L/min): 3 L/min  Intake/output summary:  Intake/Output Summary (Last 24 hours) at 04/26/2022 1137 Last data filed at 04/26/2022 0800 Gross per 24 hour  Intake 1268.63 ml  Output 750 ml  Net 518.63 ml    LBM: Last BM Date : 04/20/22 Baseline Weight: Weight: 63.5 kg Most recent weight: Weight: 63.5 kg       Palliative Assessment/Data: PPS 30%    Flowsheet Rows    Flowsheet Row Most Recent Value  Intake Tab   Referral Department Hospitalist  Unit at Time of Referral Med/Surg Unit  Palliative Care Primary Diagnosis Neurology  Date Notified 04/24/22  Palliative Care Type New Palliative care  Reason for referral Clarify Goals of Care  Date of Admission 04/20/22  Date first seen by Palliative Care 04/24/22  # of days Palliative referral response time 0 Day(s)  #  of days IP prior to Palliative referral 4  Clinical Assessment   Psychosocial & Spiritual Assessment   Palliative Care Outcomes        Patient Active Problem List   Diagnosis Date Noted   Aspiration pneumonia (HCC) 04/23/2022   Nicotine dependence    Elevated troponin    Acute CVA (cerebrovascular accident) (HCC) 05/07/2021   Non compliance w medication regimen 05/07/2021   Dementia (HCC) 05/07/2021   AF (paroxysmal atrial fibrillation) (HCC) 05/07/2021   HTN (hypertension) 11/29/2020   Alcohol abuse 11/29/2020   AKI (acute kidney injury) (HCC) 11/29/2020   Atrial flutter with rapid ventricular response (HCC) 11/29/2020   Seizures (HCC) 02/22/2020   Pubic ramus fracture (HCC) 07/29/2019   Acetabular fracture (HCC) 07/29/2019   HLD (hyperlipidemia) 07/29/2019   GERD (gastroesophageal reflux disease) 07/29/2019   UTI (urinary  tract infection) 07/29/2019    Palliative Care Assessment & Plan   HPI: 81 y.o. male  with past medical history of dementia, history of stroke with left-sided hemiparesis, seizure disorder, hypertension, GERD, alcohol use disorder, and tobacco use disorder admitted on 04/20/2022 with AMS.  Found to have acute embolic stroke.  Also found to have UTI.  Treated with anticoagulants and IV antibiotics.  Unfortunately he developed acute GI bleed.  Also developed aspiration pneumonia.  Poor p.o. intake continues along with increased aspiration risk.  PMT consulted to discuss goals of care.   Assessment: Follow-up today with patient at bedside.  Appears more alert today, feeding himself some with occupational therapy assistance.  Caregiver at at bedside.  Discussed clinical course.  Discussed plan to discharge to [facility] when medically appropriate.  Called patient's son Roe Coombs who tells me he is HCPOA.  We reviewed the above.  All questions and concerns addressed.  I completed a MOST form today.  Patient's HCPOA outlined their wishes for the following treatment decisions:  Cardiopulmonary Resuscitation: Do Not Attempt Resuscitation (DNR/No CPR)  Medical Interventions: Limited Additional Interventions: Use medical treatment, IV fluids and cardiac monitoring as indicated, DO NOT USE intubation or mechanical ventilation. May consider use of less invasive airway support such as BiPAP or CPAP. Also provide comfort measures. Transfer to the hospital if indicated. Avoid intensive care.   Antibiotics: Antibiotics if indicated  IV Fluids: IV fluids if indicated  Feeding Tube: No feeding tube    Recommendations/Plan: Continue current measures and allow time for outcomes Family updated on situation Family planning for rehab placement at discharge -hopeful for [facility] form completed as above  Goals of Care and Additional Recommendations: Limitations on Scope of Treatment: No Artificial  Feeding  Code Status: DNR  Discharge Planning: Skilled Nursing Facility for rehab with Palliative care service follow-up  Care plan was discussed with caregiver [name], son Roe Coombs, RN  Thank you for allowing the Palliative Medicine Team to assist in the care of this patient.   *Please note that this is a verbal dictation therefore any spelling or grammatical errors are due to the "[employer]" system interpretation.  Gerlean Ren, DNP, [facility] Palliative Medicine Team Team Phone # [phone]  Pager [phone]

## 2022-04-26 NOTE — Progress Notes (Addendum)
Speech Language Pathology Treatment: Dysphagia  Patient Details Name: Richard Carter MRN: [id number] DOB: Nov 01, 1941 Today's Date: 04/26/2022 Time: 4174-0814 SLP Time Calculation (min) (ACUTE ONLY): 45 min  Assessment / Plan / Recommendation Clinical Impression  Pt seen for ongoing assessment of swallowing and toleration of dysphagia level 1 diet w/ Nectar liquids post BSE. Pt appears to present w/ oropharyngeal phase Dysphagia; suspect impact from Baseline declined Cognitive status/Dementia -- "Moderate Atrophy and extensive chronic small vessel ischemic changes of the white matter", as well as impact from new CVA: right and bilateral frontal lobe, right posterior temporal lobe infarcts, per MRI this admit. OF NOTE: pt had recent Emesis episodes w/ suspected aspiration of Emesis -- a significant event. Cognitive decline can impact overall awareness/timing of swallow and safety during po tasks which increases risk for aspiration, choking. Pt's risk for aspiration can be reduced when following general aspiration precautions, given feeding support/Supervision, and when using a modified diet consistency of puree foods w/ Nectar consistency liquids. Pt also appears Edentulous.  He required MOD+ verbal/tactile/visual cues for follow through during po tasks this session. It appears his alert State waxes/wanes per description of pt in chart notes. This can increase risk for aspiration also.   Pt consumed several trials of purees and Nectar liquids via tsp/cup w/ no immediate, overt clinical s/s of aspiration noted w/ purees, Nectar liquids: no decline in vocal quality; no immediate cough, and no decline in respiratory status during/post trials. Delayed cough noted x1 post trials of Nepro(supplement drink). Oral phase was c/b increased oral phase time and bolus management/chewing of puree trials; more timely oral phase bolus management w/ Nectar liquids. Oral clearing achieved w/ the boluses given; min Time  given b/t bites/sips for lingual sweeping and f/u swallow. Pt did not attempt self-feeding; decreased awareness of task and poor follow through noted. He required MOD-MAX support and guidance w/ po tasks.  Suspect Cognitive decline impacting engagement and the oral phase of swallowing.    In setting of baseline Dementia and Cognitive decline, Edentulous status, recent CVA, need for feeding assistance, and risk for aspiration, recommend continue the dysphagia level 1(Puree foods) moistened and Nectar consistency liquids; aspiration precautions; reduce Distractions during meals and engage pt during meals for self-feeding. Pills Crushed in Puree for safer swallowing. Supervision w/ po's and support w/ feeding at meals. REFLUX precautions.   This diet consistency will be most beneficial for safer oral intake w/ ease of oral phase management. ST services recommends follow w/ Palliative Care for [place] and education re: impact of Cognitive decline/Dementia on swallowing, oral intake in general. Precautions posted in room. MD/NSG updated.  Recommend f/u at next venue of care (SNF) for both dysphagia and speech therapies in order to address both swallowing deficits, trials to upgrade diet consistency as appropriate, and provide speech therapy to address cognitive-communication abilities in setting of baseline Dementia and new CVA. Palliative care/CM/NSG updated. MD/NSG to reconsult ST services if any NEW swallowing deficits/needs while admitted.      HPI HPI: Per H&P "Richard Carter is a 81 y.o. male with medical history significant for advanced Dementia, history of CVA with left-sided hemiparesis, GERD, alcohol abuse, nicotine dependence, seizures who was brought into the ER by EMS for evaluation of mental status changes.  Per patient's caregiver his last known well was about 7.30 AM on the morning of admission when he administered patient's medications.  He noted that patient was lethargic and had slurred speech  at about 11:30 AM on the  day of admission and so he called EMS.  He states that patient drinks alcohol daily and smokes about 10 cigarettes a day.  He also states that he administers the patient's medications but can't "force him" to take the meds.  Code stroke was called and patient's initial CT scan of the head showed new/interval area of hypodensity within the right parietal lobe, suggestive of infarct that is otherwise age indeterminate by CT. Consider MRI for more sensitive evaluation. No acute hemorrhage  Remote corona radiata infarcts and chronic microvascular ischemic disease.  Patient had a CT angiogram which showed extensive atherosclerosis at the left carotid bifurcation with severe (approximately 70%) stenosis of the left ICA origin. Aortic Atherosclerosis and Emphysema .  CT perfusion: No evidence of penumbra or core infarct.  I am unable to do a review of systems on this patient due to his dysarthria  Patient did not receive tPA since his last dose of Eliquis was on the morning of his admission.".  During this admit, he had a decline in status s/p Emesis w/ potential aspiration of Emesis. He was transferred to CCU d/t decline in status w/ potential aspiration pneumonia s/p Emesis events. He is back on regular floor care currently.      SLP Plan  Goals updated (Acute goals met -- recommend f/u at SNF for ongoing dysphagia tx)      Recommendations for follow up therapy are one component of a multi-disciplinary discharge planning process, led by the attending physician.  Recommendations may be updated based on patient status, additional functional criteria and insurance authorization.    Recommendations  Diet recommendations: Dysphagia 1 (puree);Nectar-thick liquid Liquids provided via: Cup;Straw (monitor) Medication Administration: Crushed with puree Supervision: Staff to assist with self feeding;Full supervision/cueing for compensatory strategies (full assist) Compensations: Minimize  environmental distractions;Slow rate;Small sips/bites;Lingual sweep for clearance of pocketing;Multiple dry swallows after each bite/sip;Follow solids with liquid Postural Changes and/or Swallow Maneuvers: Out of bed for meals;Seated upright 90 degrees;Upright 30-60 min after meal                General recommendations:  (Palliative Care f/u; Dietician f/u) Oral Care Recommendations: Oral care BID;Oral care before and after PO;Staff/trained caregiver to provide oral care Follow Up Recommendations: Skilled nursing-short term rehab (<3 hours/day) Assistance recommended at discharge: Frequent or constant Supervision/Assistance SLP Visit Diagnosis: Dysphagia, oropharyngeal phase (R13.12) (baseline Dementia; new CVA) Plan: Goals updated (Acute goals met -- recommend f/u at SNF for ongoing dysphagia tx)              Orinda Kenner, MS, [place]; [place] [phone] (ascom) Richard Carter  04/26/2022, 1:02 PM

## 2022-04-27 LAB — BASIC METABOLIC PANEL
Anion gap: 7 (ref 5–15)
BUN: 28 mg/dL — ABNORMAL HIGH (ref 8–23)
CO2: 24 mmol/L (ref 22–32)
Calcium: 9.4 mg/dL (ref 8.9–10.3)
Chloride: 109 mmol/L (ref 98–111)
Creatinine, Ser: 0.99 mg/dL (ref 0.61–1.24)
GFR, Estimated: >60 mL/min (ref >60)
Glucose, Bld: 101 mg/dL — ABNORMAL HIGH (ref 70–99)
Potassium: 3.9 mmol/L (ref 3.5–5.1)
Sodium: 140 mmol/L (ref 135–145)

## 2022-04-27 LAB — MAGNESIUM: Magnesium: 1.7 mg/dL (ref 1.7–2.4)

## 2022-04-27 MED ORDER — DOCUSATE SODIUM 100 MG PO CAPS: 200.0000 mg | ORAL_CAPSULE | Freq: Once | ORAL | Status: DC

## 2022-04-27 MED ORDER — BISACODYL 10 MG RE SUPP
10.0000 mg | Freq: Once | RECTAL | Status: AC
  Administered 2022-04-27: 10 mg via RECTAL
  Filled 2022-04-27: qty 1

## 2022-04-27 MED ORDER — MAGNESIUM SULFATE 2 GM/50ML IV SOLN
2.0000 g | Freq: Once | INTRAVENOUS | Status: AC
  Administered 2022-04-27: 2 g via INTRAVENOUS
  Filled 2022-04-27: qty 50

## 2022-04-27 MED ORDER — DOCUSATE SODIUM 50 MG/5ML PO LIQD
200.0000 mg | Freq: Once | ORAL | Status: DC
  Filled 2022-04-27: qty 20

## 2022-04-27 NOTE — Progress Notes (Signed)
Physical Therapy Treatment Patient Details Name: Richard Carter MRN: [id number] DOB: 18-Sep-1941 Today's Date: 04/27/2022   History of Present Illness Pt is an 81 y.o. male presenting to hospital 5/27 with dysarthria and expressive aphasia.  Imaging showing "Acute infarcts in the posterior left frontal cortex and subcortical white matter, medial right parietal cortex and subcortical white matter, and right frontal white matter, with additional small subacute infarcts in the bilateral cerebellar hemispheres".  Pt admitted with acute CVA, UTI, a-fib, and h/o medication non-compliance.  Pt transferred to CCU 5/30 d/t medical concerns.  Pt developed acute GI bleeding/hematemesis and aspiration PNA; also hypokalemia and hypomagnesemia.  New therapy orders received.  PMH includes alcohol abuse, dementia, htn, seizures, stroke (with L sided hemiparesis), a-fib, AKI, pubic ramus and acetabular fx 2020, and UTI.    PT Comments    Pt resting in bed and just had a suppository per RN.  Pt willing to work with PTA.  Worked on bed mobility, unsupported sitting balance and transfers from bed <>BSC due to pt needing to have a BM.  Pt continues to require min A with bed mobility, close supervision with sitting balance, and Min to Mod A +2 for safety with stand pivot transfers demonstrating general weakness, poor postural awareness, and poor balance.  Current PT POC is still appropriate for this pt.  Recommendations for follow up therapy are one component of a multi-disciplinary discharge planning process, led by the attending physician.  Recommendations may be updated based on patient status, additional functional criteria and insurance authorization.  Follow Up Recommendations  Skilled nursing-short term rehab (<3 hours/day)     Assistance Recommended at Discharge Frequent or constant Supervision/Assistance  Patient can return home with the following Two people to help with walking and/or transfers;Two people to  help with bathing/dressing/bathroom;Assistance with cooking/housework;Direct supervision/assist for medications management;Direct supervision/assist for financial management;Assist for transportation;Help with stairs or ramp for entrance   Equipment Recommendations  Rolling walker (2 wheels);BSC/3in1;Wheelchair (measurements PT);Wheelchair cushion (measurements PT)    Recommendations for Other Services       Precautions / Restrictions Precautions Precautions: Fall Precaution Comments: Seizure precautions; aspiration; R PICC line Restrictions Weight Bearing Restrictions: No     Mobility  Bed Mobility Overal bed mobility: Needs Assistance Bed Mobility: Supine to Sit, Sit to Supine, Rolling Rolling: Supervision   Supine to sit: Min assist Sit to supine: Supervision   General bed mobility comments: vc's for technique    Transfers Overall transfer level: Needs assistance Equipment used: Rolling walker (2 wheels) Transfers: Sit to/from Stand Sit to Stand: Min assist, Mod assist, +2 physical assistance           General transfer comment: vc's for UE placement; assist to initiate stand up to RW and control descent sitting    Ambulation/Gait               General Gait Details: No gait. Transfer to [facility] and back to bed; pt had a suppository before PT session started.   Stairs             Wheelchair Mobility    Modified Rankin (Stroke Patients Only)       Balance Overall balance assessment: Needs assistance Sitting-balance support: Single extremity supported, Feet supported Sitting balance-Leahy Scale: Fair Sitting balance - Comments: steady static sitting   Standing balance support: Bilateral upper extremity supported, Reliant on assistive device for balance Standing balance-Leahy Scale: Poor Standing balance comment: cues for posture  Cognition Arousal/Alertness: Awake/alert Behavior During Therapy: Flat  affect Overall Cognitive Status: Difficult to assess                                 General Comments: difficult to understand pt's speech        Exercises      General Comments        Pertinent Vitals/Pain Pain Assessment Pain Assessment: Faces Faces Pain Scale: No hurt    Home Living                          Prior Function            PT Goals (current goals can now be found in the care plan section) Acute Rehab PT Goals Patient Stated Goal: to improve mobility PT Goal Formulation: With patient/family Time For Goal Achievement: 05/05/22 Potential to Achieve Goals: Fair Progress towards PT goals: Progressing toward goals    Frequency    7X/week      PT Plan Current plan remains appropriate    Co-evaluation              AM-PAC PT "6 Clicks" Mobility   Outcome Measure  Help needed turning from your back to your side while in a flat bed without using bedrails?: None Help needed moving from lying on your back to sitting on the side of a flat bed without using bedrails?: A Little Help needed moving to and from a bed to a chair (including a wheelchair)?: A Lot Help needed standing up from a chair using your arms (e.g., wheelchair or bedside chair)?: A Lot Help needed to walk in hospital room?: Total Help needed climbing 3-5 steps with a railing? : Total 6 Click Score: 13    End of Session   Activity Tolerance: Patient limited by fatigue Patient left: in bed;with call bell/phone within reach;with bed alarm set Nurse Communication: Mobility status PT Visit Diagnosis: Unsteadiness on feet (R26.81);Other abnormalities of gait and mobility (R26.89);Muscle weakness (generalized) (M62.81)     Time: 7035-0093 PT Time Calculation (min) (ACUTE ONLY): 15 min  Charges:  $Therapeutic Activity: 8-22 mins                     Hortencia Conradi, PTA  04/27/22, 3:13 PM

## 2022-04-27 NOTE — Progress Notes (Signed)
Patient's son Roe Coombs at bedside. Don requesting that updates/calls from staff/physician be made to caregiver [name] [phone], as he is frequently out of town/country.

## 2022-04-27 NOTE — Progress Notes (Signed)
Received to unit via bed in stable condition. Tele sitter set up via phone.

## 2022-04-27 NOTE — Plan of Care (Signed)
  Problem: Education: Goal: Knowledge of secondary prevention will improve (SELECT ALL) Outcome: Progressing Goal: Knowledge of patient specific risk factors will improve (INDIVIDUALIZE FOR PATIENT) Outcome: Progressing   Problem: Ischemic Stroke/TIA Tissue Perfusion: Goal: Complications of ischemic stroke/TIA will be minimized Outcome: Progressing   Problem: Education: Goal: Knowledge of General Education information will improve Description: Including pain rating scale, medication(s)/side effects and non-pharmacologic comfort measures Outcome: Progressing   Problem: Clinical Measurements: Goal: Ability to maintain clinical measurements within normal limits will improve Outcome: Progressing   Problem: Activity: Goal: Risk for activity intolerance will decrease Outcome: Progressing   Problem: Safety: Goal: Ability to remain free from injury will improve Outcome: Progressing   Problem: Skin Integrity: Goal: Risk for impaired skin integrity will decrease Outcome: Progressing

## 2022-04-27 NOTE — Progress Notes (Signed)
PROGRESS NOTE  MASSIMO HARTLAND [id number] DOB: 20-Jun-1941 DOA: 04/20/2022 PCP: Marisue Ivan, MD  Brief History   SAMIK BALKCOM is a 81 y.o. male with medical history significant for dementia, history of stroke with left-sided hemiparesis, seizure disorder, hypertension, GERD, alcohol use disorder, tobacco use disorder, who was brought to the hospital for evaluation of altered mental status (lethargy) and difficulty speaking.   He was found to have acute embolic stroke, severe left ICA stenosis and acute UTI.  Treatment with aspirin for 5 days followed by resumption of Eliquis was recommended.  He was treated with IV Rocephin for UTI.  Unfortunately, patient developed acute GI bleeding/hematemesis so aspirin was held.  He developed aspiration pneumonia which was treated with antibiotics as well.  He was given IV fluids for dehydration and poor oral intake.  He had hypokalemia and hypomagnesemia that were monitored and supplemented as necessary.  Plan is for discharge to rehab. He is awaiting placement.  Consultants  Palliative care Gastroenterology Neurology  Procedures  None  Antibiotics   Anti-infectives (From admission, onward)    Start     Dose/Rate Route Frequency Ordered Stop   04/24/22 1300  Ampicillin-Sulbactam (UNASYN) 3 g in sodium chloride 0.9 % 100 mL IVPB        3 g 200 mL/hr over 30 Minutes Intravenous Every 6 hours 04/24/22 1206     04/23/22 1000  cefTRIAXone (ROCEPHIN) 1 g in sodium chloride 0.9 % 100 mL IVPB  Status:  Discontinued        1 g 200 mL/hr over 30 Minutes Intravenous Every 24 hours 04/23/22 0029 04/23/22 0103   04/23/22 1000  cefTRIAXone (ROCEPHIN) 2 g in sodium chloride 0.9 % 100 mL IVPB  Status:  Discontinued        2 g 200 mL/hr over 30 Minutes Intravenous Every 24 hours 04/23/22 0103 04/24/22 1206   04/23/22 0115  Ampicillin-Sulbactam (UNASYN) 3 g in sodium chloride 0.9 % 100 mL IVPB  Status:  Discontinued        3 g 200 mL/hr over 30  Minutes Intravenous Every 6 hours 04/23/22 0029 04/23/22 0103   04/20/22 1700  cefTRIAXone (ROCEPHIN) 1 g in sodium chloride 0.9 % 100 mL IVPB        1 g 200 mL/hr over 30 Minutes Intravenous Every 24 hours 04/20/22 1636 04/23/22 0913       Subjective  The patient is resting comfortably. No new complaints.  Objective   Vitals:  Vitals:   04/27/22 1119 04/27/22 1257  BP: (!) 162/84 (!) 142/68  Pulse: 83 67  Resp: 17 18  Temp: 97.8 F (36.6 C) 97.6 F (36.4 C)  SpO2: 98% 95%    Exam:  Constitutional:  Appears calm and comfortable Respiratory:  CTA bilaterally, no w/r/r.  Respiratory effort normal. No retractions or accessory muscle use Cardiovascular:  RRR, no m/r/g No LE extremity edema   Normal pedal pulses Abdomen:  Abdomen appears normal; no tenderness or masses No hernias No HSM Musculoskeletal:  Digits/nails BUE: no clubbing, cyanosis, petechiae, infection Skin:  No rashes, lesions, ulcers palpation of skin: no induration or nodules Neurologic:  CN 2-12 intact Sensation all 4 extremities intact  I have personally reviewed the following:   Today's Data  Vitals  Lab Data  BMP  Micro Data  Urine culture is positive for E. coli  Imaging  CT head CXR MRI brain Abdominal ultrasound  Cardiology Data  EKG Echocardiogram  Other Data    Scheduled  Meds:  amLODipine  5 mg Oral Daily   atorvastatin  80 mg Oral Daily   bisacodyl  10 mg Rectal Once   chlorhexidine  15 mL Mouth Rinse BID   Chlorhexidine Gluconate Cloth  6 each Topical Daily   docusate sodium  200 mg Oral Once   feeding supplement (NEPRO CARB STEADY)  237 mL Oral TID BM   losartan  50 mg Oral Daily   mouth rinse  15 mL Mouth Rinse q12n4p   metoprolol succinate  12.5 mg Oral Daily   multivitamin with minerals  1 tablet Oral Daily   nicotine  14 mg Transdermal Daily   sodium chloride flush  10-40 mL Intracatheter Q12H   thiamine  100 mg Oral Daily   Or   thiamine  100 mg  Intravenous Daily   traZODone  100 mg Oral QHS   Continuous Infusions:  ampicillin-sulbactam (UNASYN) IV Stopped (04/27/22 0717)   levETIRAcetam 500 mg (04/27/22 1204)    Principal Problem:   Acute CVA (cerebrovascular accident) (HCC) Active Problems:   UTI (urinary tract infection)   AF (paroxysmal atrial fibrillation) (HCC)   Non compliance w medication regimen   Seizures (HCC)   HTN (hypertension)   Dementia (HCC)   Alcohol abuse   Nicotine dependence   Elevated troponin   Aspiration pneumonia (HCC)   LOS: 7 days   Acute stroke, suspect embolic stroke, severe left ICA stenosis (70%):  Patient wants to be treated with aspirin for 5 days and resume Eliquis but aspirin has been held because of acute GI bleeding. 2D echo showed EF estimated at 55 to 60%, mild to moderate MR. PT and OT as able.  Outpatient follow-up with neurologist and vascular surgeon.   Altered mental status: Improving.  Likely from acute stroke.  Repeat CT head confirmed embolic stroke.  Paroxysmal atrial fibrillation: Eliquis on hold.     Dysphagia, dehydration and poor oral intake: Restart IV fluids.  Speech therapist recommended dysphagia 1 diet.   Aspiration pneumonia and E. coli UTI: Continue IV Rocephin.  Follow-up with speech therapist for swallow evaluation.   Acute GI Bleeding/ hematemesis: H&H is stable.  Continue IV Protonix.  Aspirin still on hold.  Consulted Dr. Mia Creek, gastroenterologist, to assist with management.  Recommendation is for protonix. There are no plans for any endoscopic procedures as risks outweigh benefits given the patient's clinical condition.  Hypokalemia and hypomagnesemia: Replete magnesium with IV magnesium sulfate.  Potassium level is okay. Continue to monitor.  Alcohol use disorder: Use Ativan only as needed per CIWA protocol.    Hypertension: Use IV metoprolol as needed for severe hypertension.  Restart amlodipine and losartan.  Other comorbidities include  seizure disorder, dementia, tobacco use disorder, medical nonadherence   Palliative care has evaluated patient. The patient and family has expressed their desire to continue current measures and allow time for outcomes to be observed. He is DNR. Plan is for discharge to rehab. Insurance authorization is being sought.  DVT Prophylaxis: SCD's CODE STATUS: DNR Family Communication: None available Disposition: Rehab  Fran Lowes, DO [facility] April 27, 2022; 2:09 PM

## 2022-04-28 DIAGNOSIS — I639 Cerebral infarction, unspecified: Secondary | ICD-10-CM | POA: Diagnosis not present

## 2022-04-28 MED ORDER — LEVETIRACETAM 500 MG PO TABS
500.0000 mg | ORAL_TABLET | Freq: Two times a day (BID) | ORAL | Status: DC
  Administered 2022-04-28 – 2022-04-29 (×2): 500 mg via ORAL
  Filled 2022-04-28 (×2): qty 1

## 2022-04-28 NOTE — Plan of Care (Signed)
  Problem: Education: Goal: Knowledge of secondary prevention will improve (SELECT ALL) Outcome: Progressing Goal: Knowledge of patient specific risk factors will improve (INDIVIDUALIZE FOR PATIENT) Outcome: Progressing   Problem: Ischemic Stroke/TIA Tissue Perfusion: Goal: Complications of ischemic stroke/TIA will be minimized Outcome: Progressing   Problem: Education: Goal: Knowledge of General Education information will improve Description: Including pain rating scale, medication(s)/side effects and non-pharmacologic comfort measures Outcome: Progressing   Problem: Clinical Measurements: Goal: Ability to maintain clinical measurements within normal limits will improve Outcome: Progressing   Problem: Activity: Goal: Risk for activity intolerance will decrease Outcome: Progressing   Problem: Safety: Goal: Ability to remain free from injury will improve Outcome: Progressing   Problem: Skin Integrity: Goal: Risk for impaired skin integrity will decrease Outcome: Progressing   

## 2022-04-28 NOTE — TOC Progression Note (Addendum)
Transition of Care [facility]) - Progression Note    Patient Details  Name: Richard Carter MRN: [id number] Date of Birth: December 31, 1940  Transition of [facility] CM/SW Contact  Izola Price, RN Phone Number: 04/28/2022, 12:57 PM  Clinical Narrative:  6/4: Left VM with [facility] regarding status of insurance authorization. Left call back number for phone or text message requested status update. Family contact noted to be [name] at [phone]. 1258 pm. Simmie Davies RN CM    Update: 124 pm: Hope from [facility] called back and insurance authorization was obtained late Friday and they can accept patient Monday if medically ready. Simmie Davies RN CM   Expected Discharge Plan: Skilled Nursing Facility Barriers to Discharge: Continued Medical Work up  Expected Discharge Plan and Services Expected Discharge Plan: [place]       Living arrangements for the past 2 months: Single Family Home                                       Social Determinants of Health (SDOH) Interventions    Readmission Risk Interventions    04/21/2022    1:55 PM  Readmission Risk Prevention Plan  Post Dischage Appt Complete  Medication Screening Complete  Transportation Screening Complete

## 2022-04-28 NOTE — Progress Notes (Signed)
Physical Therapy Treatment Patient Details Name: Richard Carter MRN: [id number] DOB: [birth year] Today's Date: 04/28/2022   History of Present Illness Pt is an 81 y.o. male presenting to hospital 5/27 with dysarthria and expressive aphasia.  Imaging showing "Acute infarcts in the posterior left frontal cortex and subcortical white matter, medial right parietal cortex and subcortical white matter, and right frontal white matter, with additional small subacute infarcts in the bilateral cerebellar hemispheres".  Pt admitted with acute CVA, UTI, a-fib, and h/o medication non-compliance.  Pt transferred to CCU 5/30 d/t medical concerns.  Pt developed acute GI bleeding/hematemesis and aspiration PNA; also hypokalemia and hypomagnesemia.  New therapy orders received.  PMH includes alcohol abuse, dementia, htn, seizures, stroke (with L sided hemiparesis), a-fib, AKI, pubic ramus and acetabular fx 2020, and UTI.    PT Comments    Participated in exercises as described below.  Pt asleep but easily awakens to voice.  To EOB with min a x 1 and increased time and cues.  He is able to sit EOB with supervision.  Standing with RW and min a x 1 for pericare due to loose soft medium BM.  After care he is  able to transfer to recliner at bedside with RW and min a x 1.  Poor step quality.  Remains in chair with son in room.     Recommendations for follow up therapy are one component of a multi-disciplinary discharge planning process, led by the attending physician.  Recommendations may be updated based on patient status, additional functional criteria and insurance authorization.  Follow Up Recommendations  Skilled nursing-short term rehab (<3 hours/day)     Assistance Recommended at Discharge Frequent or constant Supervision/Assistance  Patient can return home with the following Two people to help with walking and/or transfers;Two people to help with bathing/dressing/bathroom;Assistance with cooking/housework;Direct  supervision/assist for medications management;Direct supervision/assist for financial management;Assist for transportation;Help with stairs or ramp for entrance   Equipment Recommendations  Rolling walker (2 wheels);BSC/3in1;Wheelchair (measurements PT);Wheelchair cushion (measurements PT)    Recommendations for Other Services       Precautions / Restrictions Precautions Precautions: Fall Precaution Comments: Seizure precautions; aspiration; R PICC line Restrictions Weight Bearing Restrictions: No     Mobility  Bed Mobility Overal bed mobility: Needs Assistance Bed Mobility: Supine to Sit     Supine to sit: Min assist          Transfers Overall transfer level: Needs assistance Equipment used: Rolling walker (2 wheels) Transfers: Sit to/from Stand Sit to Stand: Min assist                Ambulation/Gait Ambulation/Gait assistance: Min Web designer (Feet): 2 Feet Assistive device: Rolling walker (2 wheels) Gait Pattern/deviations: Step-to pattern Gait velocity: decreased     General Gait Details: able to take a couple poor quality steps to recliner at bedside with RW   Stairs             Wheelchair Mobility    Modified Rankin (Stroke Patients Only)       Balance Overall balance assessment: Needs assistance Sitting-balance support: Single extremity supported, Feet supported Sitting balance-Leahy Scale: Fair Sitting balance - Comments: steady static sitting   Standing balance support: Bilateral upper extremity supported, Reliant on assistive device for balance Standing balance-Leahy Scale: Poor Standing balance comment: cues for posture  Cognition Arousal/Alertness: Awake/alert Behavior During Therapy: Flat affect Overall Cognitive Status: Within Functional Limits for tasks assessed                                 General Comments: very soft speach and difficult to understand but  son does well hearing what he is saying        Exercises Other Exercises Other Exercises: static standing for care due to medium loose BM    General Comments        Pertinent Vitals/Pain Pain Assessment Pain Assessment: No/denies pain    Home Living                          Prior Function            PT Goals (current goals can now be found in the care plan section) Progress towards PT goals: Progressing toward goals    Frequency    7X/week      PT Plan Current plan remains appropriate    Co-evaluation              AM-PAC PT "6 Clicks" Mobility   Outcome Measure  Help needed turning from your back to your side while in a flat bed without using bedrails?: None Help needed moving from lying on your back to sitting on the side of a flat bed without using bedrails?: A Little Help needed moving to and from a bed to a chair (including a wheelchair)?: A Lot Help needed standing up from a chair using your arms (e.g., wheelchair or bedside chair)?: A Little Help needed to walk in hospital room?: Total Help needed climbing 3-5 steps with a railing? : Total 6 Click Score: 14    End of Session Equipment Utilized During Treatment: Gait belt   Patient left: in chair;with call bell/phone within reach;with chair alarm set;with family/visitor present Nurse Communication: Mobility status PT Visit Diagnosis: Unsteadiness on feet (R26.81);Other abnormalities of gait and mobility (R26.89);Muscle weakness (generalized) (M62.81)     Time: [id number] PT Time Calculation (min) (ACUTE ONLY): 17 min  Charges:  $Therapeutic Activity: 8-22 mins                   Chesley Noon, PTA 04/28/22, 9:29 AM

## 2022-04-28 NOTE — Progress Notes (Signed)
PROGRESS NOTE  Richard Carter [id number] DOB: 07/22/1941 DOA: 04/20/2022 PCP: Dion Body, MD  Brief History   Richard Carter is a 81 y.o. male with medical history significant for dementia, history of stroke with left-sided hemiparesis, seizure disorder, hypertension, GERD, alcohol use disorder, tobacco use disorder, who was brought to the hospital for evaluation of altered mental status (lethargy) and difficulty speaking.   He was found to have acute embolic stroke, severe left ICA stenosis and acute UTI.  Treatment with aspirin for 5 days followed by resumption of Eliquis was recommended.  He was treated with IV Rocephin for UTI.  Unfortunately, patient developed acute GI bleeding/hematemesis so aspirin was held.  He developed aspiration pneumonia which was treated with antibiotics as well.  He was given IV fluids for dehydration and poor oral intake.  He had hypokalemia and hypomagnesemia that were monitored and supplemented as necessary.  Plan is for discharge to rehab. He is awaiting placement.  Consultants  Palliative care Gastroenterology Neurology  Procedures  None  Antibiotics   Anti-infectives (From admission, onward)    Start     Dose/Rate Route Frequency Ordered Stop   04/24/22 1300  Ampicillin-Sulbactam (UNASYN) 3 g in sodium chloride 0.9 % 100 mL IVPB        3 g 200 mL/hr over 30 Minutes Intravenous Every 6 hours 04/24/22 1206     04/23/22 1000  cefTRIAXone (ROCEPHIN) 1 g in sodium chloride 0.9 % 100 mL IVPB  Status:  Discontinued        1 g 200 mL/hr over 30 Minutes Intravenous Every 24 hours 04/23/22 0029 04/23/22 0103   04/23/22 1000  cefTRIAXone (ROCEPHIN) 2 g in sodium chloride 0.9 % 100 mL IVPB  Status:  Discontinued        2 g 200 mL/hr over 30 Minutes Intravenous Every 24 hours 04/23/22 0103 04/24/22 1206   04/23/22 0115  Ampicillin-Sulbactam (UNASYN) 3 g in sodium chloride 0.9 % 100 mL IVPB  Status:  Discontinued        3 g 200 mL/hr over 30  Minutes Intravenous Every 6 hours 04/23/22 0029 04/23/22 0103   04/20/22 1700  cefTRIAXone (ROCEPHIN) 1 g in sodium chloride 0.9 % 100 mL IVPB        1 g 200 mL/hr over 30 Minutes Intravenous Every 24 hours 04/20/22 1636 04/23/22 0913       Subjective  The patient is resting comfortably. He is more interactive today. No new complaints. Son is at bedside.  Objective   Vitals:  Vitals:   04/28/22 0802 04/28/22 0949  BP: (!) 147/56   Pulse: (!) 51 64  Resp: 14   Temp: (!) 97.3 F (36.3 C)   SpO2: 95%     Exam:  Constitutional:  Appears calm and comfortable. Respiratory:  CTA bilaterally, no w/r/r.  Respiratory effort normal. No retractions or accessory muscle use Cardiovascular:  RRR, no m/r/g No LE extremity edema   Normal pedal pulses Abdomen:  Abdomen appears normal; no tenderness or masses No hernias No HSM Musculoskeletal:  Digits/nails BUE: no clubbing, cyanosis, petechiae, infection Skin:  No rashes, lesions, ulcers palpation of skin: no induration or nodules Neurologic:  CN 2-12 intact Sensation all 4 extremities intact  I have personally reviewed the following:   Today's Data  Vitals  Lab Data    Micro Data  Urine culture is positive for E. coli  Imaging  CT head CXR MRI brain Abdominal ultrasound  Cardiology Data  EKG Echocardiogram  Other Data    Scheduled Meds:  amLODipine  5 mg Oral Daily   atorvastatin  80 mg Oral Daily   chlorhexidine  15 mL Mouth Rinse BID   Chlorhexidine Gluconate Cloth  6 each Topical Daily   docusate  200 mg Per Tube Once   feeding supplement (NEPRO CARB STEADY)  237 mL Oral TID BM   losartan  50 mg Oral Daily   mouth rinse  15 mL Mouth Rinse q12n4p   metoprolol succinate  12.5 mg Oral Daily   multivitamin with minerals  1 tablet Oral Daily   nicotine  14 mg Transdermal Daily   sodium chloride flush  10-40 mL Intracatheter Q12H   thiamine  100 mg Oral Daily   Or   thiamine  100 mg Intravenous Daily    traZODone  100 mg Oral QHS   Continuous Infusions:  ampicillin-sulbactam (UNASYN) IV 3 g (04/28/22 1127)   levETIRAcetam 500 mg (04/28/22 1201)    Principal Problem:   Acute CVA (cerebrovascular accident) ([place]) Active Problems:   UTI (urinary tract infection)   AF (paroxysmal atrial fibrillation) (HCC)   Non compliance w medication regimen   Seizures (HCC)   HTN (hypertension)   Dementia (HCC)   Alcohol abuse   Nicotine dependence   Elevated troponin   Aspiration pneumonia ([place])   LOS: 8 days   Acute stroke, suspect embolic stroke, severe left ICA stenosis (70%):  Patient wants to be treated with aspirin for 5 days and resume Eliquis but aspirin has been held because of acute GI bleeding. 2D echo showed EF estimated at 55 to 60%, mild to moderate MR. PT and OT as able.  Outpatient follow-up with neurologist and vascular surgeon.   Altered mental status: Improving.  Likely from acute stroke.  Repeat CT head confirmed embolic stroke.  Paroxysmal atrial fibrillation: Eliquis on hold.     Dysphagia, dehydration and poor oral intake: Restart IV fluids.  Speech therapist recommended dysphagia 1 diet.   Aspiration pneumonia and E. coli UTI: Continue IV Rocephin.  Follow-up with speech therapist for swallow evaluation.   Acute GI Bleeding/ hematemesis: H&H is stable.  Continue IV Protonix.  Aspirin still on hold.  Consulted Dr. Haig Prophet, gastroenterologist, to assist with management.  Recommendation is for protonix. There are no plans for any endoscopic procedures as risks outweigh benefits given the patient's clinical condition.  Hypokalemia and hypomagnesemia: Replete magnesium with IV magnesium sulfate.  Potassium level is okay. Continue to monitor.  Alcohol use disorder: Use Ativan only as needed per CIWA protocol.    Hypertension: Use IV metoprolol as needed for severe hypertension.  Restart amlodipine and losartan.  Other comorbidities include seizure disorder, dementia,  tobacco use disorder, medical nonadherence   Palliative care has evaluated patient. The patient and family has expressed their desire to continue current measures and allow time for outcomes to be observed. He is DNR. Plan is for discharge to rehab. Insurance authorization is being sought.  DVT Prophylaxis: SCD's CODE STATUS: DNR Family Communication: None available Disposition: Rehab  Karie Kirks, DO [facility] April 28, 2022; 1:09 PM

## 2022-04-28 NOTE — Progress Notes (Signed)
PHARMACIST - PHYSICIAN COMMUNICATION  CONCERNING: IV to Oral Route Change Policy  RECOMMENDATION: This patient is receiving levetiracetam by the intravenous route.  Based on criteria approved by the Pharmacy and Therapeutics Committee, the intravenous medication(s) is/are being converted to the equivalent oral dose form(s).   DESCRIPTION: These criteria include: The patient is eating (either orally or via tube) and/or has been taking other orally administered medications for a least 24 hours The patient has no evidence of active gastrointestinal bleeding or impaired GI absorption (gastrectomy, short bowel, patient on TNA or NPO).  If you have questions about this conversion, please contact the Pharmacy Department   Tressie Ellis, [facility] 04/28/2022 1:49 PM

## 2022-04-29 DIAGNOSIS — I639 Cerebral infarction, unspecified: Secondary | ICD-10-CM | POA: Diagnosis not present

## 2022-04-29 DIAGNOSIS — Z743 Need for continuous supervision: Secondary | ICD-10-CM | POA: Diagnosis not present

## 2022-04-29 DIAGNOSIS — R4182 Altered mental status, unspecified: Secondary | ICD-10-CM | POA: Diagnosis not present

## 2022-04-29 DIAGNOSIS — N390 Urinary tract infection, site not specified: Secondary | ICD-10-CM | POA: Diagnosis not present

## 2022-04-29 DIAGNOSIS — F17200 Nicotine dependence, unspecified, uncomplicated: Secondary | ICD-10-CM | POA: Diagnosis not present

## 2022-04-29 DIAGNOSIS — J690 Pneumonitis due to inhalation of food and vomit: Secondary | ICD-10-CM | POA: Diagnosis not present

## 2022-04-29 DIAGNOSIS — R2681 Unsteadiness on feet: Secondary | ICD-10-CM | POA: Diagnosis not present

## 2022-04-29 DIAGNOSIS — I69354 Hemiplegia and hemiparesis following cerebral infarction affecting left non-dominant side: Secondary | ICD-10-CM | POA: Diagnosis not present

## 2022-04-29 DIAGNOSIS — K219 Gastro-esophageal reflux disease without esophagitis: Secondary | ICD-10-CM | POA: Diagnosis not present

## 2022-04-29 DIAGNOSIS — R5381 Other malaise: Secondary | ICD-10-CM | POA: Diagnosis not present

## 2022-04-29 DIAGNOSIS — Z79899 Other long term (current) drug therapy: Secondary | ICD-10-CM | POA: Diagnosis not present

## 2022-04-29 DIAGNOSIS — I69398 Other sequelae of cerebral infarction: Secondary | ICD-10-CM | POA: Diagnosis not present

## 2022-04-29 DIAGNOSIS — I10 Essential (primary) hypertension: Secondary | ICD-10-CM | POA: Diagnosis not present

## 2022-04-29 DIAGNOSIS — I480 Paroxysmal atrial fibrillation: Secondary | ICD-10-CM | POA: Diagnosis not present

## 2022-04-29 DIAGNOSIS — F1090 Alcohol use, unspecified, uncomplicated: Secondary | ICD-10-CM | POA: Diagnosis not present

## 2022-04-29 DIAGNOSIS — K922 Gastrointestinal hemorrhage, unspecified: Secondary | ICD-10-CM | POA: Diagnosis not present

## 2022-04-29 DIAGNOSIS — G40909 Epilepsy, unspecified, not intractable, without status epilepticus: Secondary | ICD-10-CM | POA: Diagnosis not present

## 2022-04-29 DIAGNOSIS — E785 Hyperlipidemia, unspecified: Secondary | ICD-10-CM | POA: Diagnosis not present

## 2022-04-29 DIAGNOSIS — Z8673 Personal history of transient ischemic attack (TIA), and cerebral infarction without residual deficits: Secondary | ICD-10-CM | POA: Diagnosis not present

## 2022-04-29 DIAGNOSIS — F0390 Unspecified dementia without behavioral disturbance: Secondary | ICD-10-CM | POA: Diagnosis not present

## 2022-04-29 DIAGNOSIS — M6281 Muscle weakness (generalized): Secondary | ICD-10-CM | POA: Diagnosis not present

## 2022-04-29 MED ORDER — ADULT MULTIVITAMIN W/MINERALS CH: 1.0000 | ORAL_TABLET | Freq: Every day | ORAL | 0 refills | Status: DC

## 2022-04-29 MED ORDER — NICOTINE 14 MG/24HR TD PT24
14.0000 mg | MEDICATED_PATCH | Freq: Every day | TRANSDERMAL | 0 refills | Status: DC
Start: 2022-04-30 — End: 2022-08-26

## 2022-04-29 MED ORDER — FOOD THICKENER (SIMPLYTHICK): 1.0000 | ORAL | 0 refills | Status: DC | PRN

## 2022-04-29 MED ORDER — NEPRO/CARBSTEADY PO LIQD: 237.0000 mL | Freq: Three times a day (TID) | ORAL | 0 refills | Status: DC

## 2022-04-29 MED ORDER — PANTOPRAZOLE SODIUM 40 MG PO TBEC: 40.0000 mg | DELAYED_RELEASE_TABLET | Freq: Two times a day (BID) | ORAL | 0 refills | Status: DC

## 2022-04-29 MED ORDER — ATORVASTATIN CALCIUM 80 MG PO TABS: 80.0000 mg | ORAL_TABLET | Freq: Every day | ORAL | 0 refills | Status: DC

## 2022-04-29 MED ORDER — METOPROLOL SUCCINATE ER 25 MG PO TB24: 12.5000 mg | ORAL_TABLET | Freq: Every day | ORAL | 0 refills | Status: DC

## 2022-04-29 MED ORDER — NICOTINE 14 MG/24HR TD PT24: 14.0000 mg | MEDICATED_PATCH | Freq: Every day | TRANSDERMAL | 0 refills | Status: DC

## 2022-04-29 MED ORDER — THIAMINE HCL 100 MG PO TABS: 100.0000 mg | ORAL_TABLET | Freq: Every day | ORAL | 0 refills | Status: DC

## 2022-04-29 NOTE — Assessment & Plan Note (Signed)
The patient has completed a 5 day course of Unasyn.

## 2022-04-29 NOTE — Care Management Important Message (Signed)
Important Message  Patient Details  Name: Richard Carter MRN: [id number] Date of Birth: 1940-12-22   Medicare Important Message Given:  Yes  I reviewed the Important Message from Medicare with son, Lucious Zou by phone [phone]) and he is in agreement with the discharge. I asked if he would like a copy but he said it wasn't necessary.  He stated he was pleased with his care here and glad he was ready to discharge SNF. I thanked him for his time.    Olegario Messier A Danisa Kopec 04/29/2022, 12:53 PM

## 2022-04-29 NOTE — Progress Notes (Signed)
Physical Therapy Treatment Patient Details Name: Richard Carter MRN: [id number] DOB: September 30, 1941 Today's Date: 04/29/2022   History of Present Illness Pt is an 81 y.o. male presenting to hospital 5/27 with dysarthria and expressive aphasia.  Imaging showing "Acute infarcts in the posterior left frontal cortex and subcortical white matter, medial right parietal cortex and subcortical white matter, and right frontal white matter, with additional small subacute infarcts in the bilateral cerebellar hemispheres".  Pt admitted with acute CVA, UTI, a-fib, and h/o medication non-compliance.  Pt transferred to CCU 5/30 d/t medical concerns.  Pt developed acute GI bleeding/hematemesis and aspiration PNA; also hypokalemia and hypomagnesemia.  New therapy orders received.  PMH includes alcohol abuse, dementia, htn, seizures, stroke (with L sided hemiparesis), a-fib, AKI, pubic ramus and acetabular fx 2020, and UTI.    PT Comments    Participated in exercises as described below.  Pt is able to stand and walk 6' x 2 in room with RW and poor quality gait.  Heavy cues for hand placements and general safety.  +1 assist at all times but more alert, engaged and talkative today.   Recommendations for follow up therapy are one component of a multi-disciplinary discharge planning process, led by the attending physician.  Recommendations may be updated based on patient status, additional functional criteria and insurance authorization.  Follow Up Recommendations  Skilled nursing-short term rehab (<3 hours/day)     Assistance Recommended at Discharge Frequent or constant Supervision/Assistance  Patient can return home with the following Assistance with cooking/housework;Direct supervision/assist for medications management;Direct supervision/assist for financial management;Assist for transportation;Help with stairs or ramp for entrance;A little help with walking and/or transfers;A lot of help with walking and/or  transfers;A little help with bathing/dressing/bathroom   Equipment Recommendations  Rolling walker (2 wheels);BSC/3in1;Wheelchair (measurements PT);Wheelchair cushion (measurements PT)    Recommendations for Other Services       Precautions / Restrictions Precautions Precautions: Fall Precaution Comments: Seizure precautions; aspiration; R PICC line Restrictions Weight Bearing Restrictions: No     Mobility  Bed Mobility               General bed mobility comments: in chair before and after    Transfers Overall transfer level: Needs assistance Equipment used: Rolling walker (2 wheels) Transfers: Sit to/from Stand Sit to Stand: Min assist                Ambulation/Gait Ambulation/Gait assistance: Min assist Gait Distance (Feet): 6 Feet Assistive device: Rolling walker (2 wheels) Gait Pattern/deviations: Step-to pattern, Decreased step length - right, Decreased step length - left, Wide base of support, Trunk flexed Gait velocity: decreased     General Gait Details: poor quality steps but is able to step about 6' x 2 with encouragement   Stairs             Wheelchair Mobility    Modified Rankin (Stroke Patients Only)       Balance Overall balance assessment: Needs assistance Sitting-balance support: Single extremity supported, Feet supported Sitting balance-Leahy Scale: Fair Sitting balance - Comments: steady static sitting   Standing balance support: Bilateral upper extremity supported, Reliant on assistive device for balance Standing balance-Leahy Scale: Poor Standing balance comment: cues for posture                            Cognition Arousal/Alertness: Awake/alert Behavior During Therapy: Flat affect Overall Cognitive Status: Within Functional Limits for tasks assessed  Exercises Other Exercises Other Exercises: standing and seated ex x 10    General Comments         Pertinent Vitals/Pain Pain Assessment Pain Assessment: No/denies pain    Home Living                          Prior Function            PT Goals (current goals can now be found in the care plan section) Progress towards PT goals: Progressing toward goals    Frequency    7X/week      PT Plan Current plan remains appropriate    Co-evaluation              AM-PAC PT "6 Clicks" Mobility   Outcome Measure  Help needed turning from your back to your side while in a flat bed without using bedrails?: None Help needed moving from lying on your back to sitting on the side of a flat bed without using bedrails?: A Little Help needed moving to and from a bed to a chair (including a wheelchair)?: A Little Help needed standing up from a chair using your arms (e.g., wheelchair or bedside chair)?: A Little Help needed to walk in hospital room?: A Little Help needed climbing 3-5 steps with a railing? : Total 6 Click Score: 17    End of Session Equipment Utilized During Treatment: Gait belt Activity Tolerance: Patient tolerated treatment well Patient left: in chair;with call bell/phone within reach;with chair alarm set;with family/visitor present Nurse Communication: Mobility status PT Visit Diagnosis: Unsteadiness on feet (R26.81);Other abnormalities of gait and mobility (R26.89);Muscle weakness (generalized) (M62.81)     Time: 9798-9211 PT Time Calculation (min) (ACUTE ONLY): 12 min  Charges:  $Gait Training: 8-22 mins                   Danielle Dess, PTA 04/29/22, 11:47 AM

## 2022-04-29 NOTE — TOC Transition Note (Signed)
Transition of Care [facility]) - CM/SW Discharge Note   Patient Details  Name: Richard Carter MRN: [id number] Date of Birth: 27-Aug-1941  [facility]) CM/SW Contact:  Maree Krabbe, LCSW Phone Number: 04/29/2022, 1:59 PM   Clinical Narrative:   Clinical Social Worker facilitated patient discharge including contacting patient family and facility to confirm patient discharge plans.  Clinical information faxed to facility and family agreeable with plan.  CSW arranged ambulance transport via [facility] to [facility] (room 212).  RN to call [phone] for report prior to discharge.     Final next level of care: Skilled Nursing Facility Barriers to Discharge: No Barriers Identified   Patient Goals and CMS Choice Patient states their goals for this hospitalization and ongoing recovery are:: SNF CMS Medicare.gov Compare Post Acute Care list provided to:: Patient Represenative (must comment) Choice offered to / list presented to : Adult Children  Discharge Placement              Patient chooses bed at:  [facility]) Patient to be transferred to facility by: [facility]   Patient and family notified of of transfer: 04/29/22  Discharge Plan and Services                                     Social Determinants of Health (SDOH) Interventions     Readmission Risk Interventions    04/21/2022    1:55 PM  Readmission Risk Prevention Plan  Post Dischage Appt Complete  Medication Screening Complete  Transportation Screening Complete

## 2022-04-29 NOTE — TOC Progression Note (Signed)
Transition of Care [facility]) - Progression Note    Patient Details  Name: Richard Carter MRN: [id number] Date of Birth: 1941-01-30  [facility] CM/SW Contact  Maree Krabbe, LCSW Phone Number: 04/29/2022, 11:44 AM  Clinical Narrative:   Pt dc to [facility] today . Auth obtained.    Expected Discharge Plan: Skilled Nursing Facility Barriers to Discharge: Continued Medical Work up  Expected Discharge Plan and Services Expected Discharge Plan: Skilled Nursing Facility       Living arrangements for the past 2 months: Single Family Home                                       Social Determinants of Health (SDOH) Interventions    Readmission Risk Interventions    04/21/2022    1:55 PM  Readmission Risk Prevention Plan  Post Dischage Appt Complete  Medication Screening Complete  Transportation Screening Complete

## 2022-04-29 NOTE — Discharge Summary (Signed)
Physician Discharge Summary   Patient: Richard Carter MRN: [id number] DOB: Jan 26, 1941  Admit date:     04/20/2022  Discharge date: 04/29/22  Discharge Physician: Fran Lowes   PCP: Marisue Ivan, MD   Recommendations at discharge:   Discharge to SNF Follow up with PCP in 7-10 days after discharge from SNF. CBC and chemistry to be drawn on that visit to be reported to PCP. Follow up with GI in 2-4 weeks. Call for appointment. Follow up with neurology in 2 weeks. Call for appointment. Avoid NSAIDS Avoid ETOH  Discharge Diagnoses: Principal Problem:   Acute CVA (cerebrovascular accident) (HCC) Active Problems:   UTI (urinary tract infection)   AF (paroxysmal atrial fibrillation) (HCC)   Non compliance w medication regimen   Seizures (HCC)   HTN (hypertension)   Dementia (HCC)   Alcohol abuse   Nicotine dependence   Elevated troponin   Aspiration pneumonia [facility])  Hospital Course: Richard Carter is a 81 y.o. male with medical history significant for dementia, history of stroke with left-sided hemiparesis, seizure disorder, hypertension, GERD, alcohol use disorder, tobacco use disorder, who was brought to the hospital for evaluation of altered mental status (lethargy) and difficulty speaking.   He was found to have acute embolic stroke, severe left ICA stenosis and acute UTI.  Treatment with aspirin for 5 days followed by resumption of Eliquis was recommended.  He was treated with IV Rocephin for UTI.  Unfortunately, patient developed acute GI bleeding/hematemesis so aspirin was held.  He developed aspiration pneumonia which was treated with antibiotics as well.  He was given IV fluids for dehydration and poor oral intake.  He had hypokalemia and hypomagnesemia that were monitored and supplemented as necessary.  GI was consulted and evaluated the patient. He was deemed a poor candidate for EGD or other invasive procedure at this time due to recent CVA. He will be discharged on  oral protonix and follow up with GI as outpatient. The patient has known history of NSAID use and ETOH abuse. He will be instructed to avoid NSAIDS and ETOH after discharge.   Plan is for discharge to rehab. He is awaiting placement.  Assessment and Plan: * Acute CVA (cerebrovascular accident) [facility]) Patient presents for evaluation of dysarthria and CT scan of the head shows a new/interval area of hypodensity within the right parietal lobe, suggestive of infarct that is otherwise age-indeterminate CT angiogram is negative for LVO MRI confirms acute CVA of the posterior left frontal cortex and subcortical white matter, medial right parietal cortex and subcortical white matter, as well as additional small subacute infarcts in the bilateral cerebellar hemispheres. No hemorrhage mass effect or midline shift.  Permissive hypertension was allowed for the first 48 hours. 2D echocardiogram demonstrated no cardiac thrombus, LVEF was 55-60% with normal function and no regional wall motion abnormalities. Diastolic parameters were indeterminate. No atrial level shunt was obtained. Carotid US was performed on 05/08/2021 It demonstrated mild stenosis of the proximal right ICA dn left ICA. Vertebral arteries were patent with normal antegrade flow. This study was not repeated. We will request PT/OT/ST consult Discussed with neurologist who agrees with holding Eliquis for now and to start patient on aspirin in a.m. Start high intensity statin. He will follow up with neurology in 2 weeks.  UTI (urinary tract infection) Patient noted to have pyuria Prior urine culture from 08/22 yields E. Coli He has completed antibiotic therapy for this diagnosis.  AF (paroxysmal atrial fibrillation) (HCC) Patient has a history of  paroxysmal A-fib and may not be compliant with prescribed Eliquis as secondary prophylaxis for acute stroke. Hold Eliquis now due to acute stroke and acute GI bleed. Decision to restart eliquis is left to  PCP and Gastroenterology.  Non compliance w medication regimen Patient has a history of medication noncompliance The patient has been counseled on the need to be compliant with prescribed medications  Seizures (HCC) Place patient on seizure precautions. Continue Keppra.  HTN (hypertension) We will allow for permissive hypertension due to acute stroke Amlodipine, metoprolol and Cozaar have been restarted. The patient is tolerating BP well.  Dementia (HCC) Stable Will require increased nursing assistance for ADLs  Alcohol abuse Patient at risk for developing symptoms of alcohol withdrawal Monitor closely and administer lorazepam for CIWA score of 8 or greater Place patient on MVI/thiamine/folic acid  Nicotine dependence Place patient on nicotine transdermal patch 14 mg daily  Aspiration pneumonia (HCC) The patient has completed a 5 day course of Unasyn.   Elevated troponin Most likely related to acute stroke We will cycle cardiac enzymes Follow-up results of 2D echocardiogram to rule out regional wall motion abnormality Continue high intensity statins   I have seen and examined this patient myself. I have spent 42 minutes in his evaluation and care.  Consultants:  Palliative care   Neurology   Gastroenterology  Procedures performed: None  Disposition: Skilled nursing facility Diet recommendation:  Discharge Diet Orders (From admission, onward)     Start     Ordered   04/29/22 0000  Diet - low sodium heart healthy        04/29/22 1235           Cardiac diet DISCHARGE MEDICATION: Allergies as of 04/29/2022   No Known Allergies      Medication List     STOP taking these medications    apixaban 5 MG Tabs tablet Commonly known as: ELIQUIS   meloxicam 7.5 MG tablet Commonly known as: MOBIC   omeprazole 40 MG capsule Commonly known as: PRILOSEC   potassium chloride SA 20 MEQ tablet Commonly known as: KLOR-CON M       TAKE these medications     amLODipine 5 MG tablet Commonly known as: NORVASC Take 1 tablet (5 mg total) by mouth daily.   atorvastatin 80 MG tablet Commonly known as: LIPITOR Take 1 tablet (80 mg total) by mouth daily. Start taking on: April 30, 2022 What changed:  medication strength how much to take   feeding supplement (NEPRO CARB STEADY) Liqd Take 237 mLs by mouth 3 (three) times daily between meals.   food thickener Gel Commonly known as: SIMPLYTHICK (NECTAR/LEVEL 2/MILDLY THICK) Take 1 packet by mouth as needed.   levETIRAcetam 500 MG tablet Commonly known as: KEPPRA Take 1 tablet (500 mg total) by mouth 2 (two) times daily.   losartan 50 MG tablet Commonly known as: COZAAR Take 50 mg by mouth daily.   magnesium oxide 400 MG tablet Commonly known as: MAG-OX Take 400 mg by mouth daily.   metoprolol succinate 25 MG 24 hr tablet Commonly known as: TOPROL-XL Take 0.5 tablets (12.5 mg total) by mouth daily. Start taking on: April 30, 2022 What changed:  medication strength how much to take   multivitamin with minerals Tabs tablet Take 1 tablet by mouth daily. Start taking on: April 30, 2022   nicotine 14 mg/24hr patch Commonly known as: NICODERM CQ - dosed in mg/24 hours Place 1 patch (14 mg total) onto the skin daily. Start  taking on: April 30, 2022   pantoprazole 40 MG tablet Commonly known as: Protonix Take 1 tablet (40 mg total) by mouth 2 (two) times daily. After sixty days the patient is to continue protonix at 40 mg daily.   thiamine 100 MG tablet Take 1 tablet (100 mg total) by mouth daily. Start taking on: April 30, 2022   traZODone 100 MG tablet Commonly known as: DESYREL Take 1 tablet (100 mg total) by mouth at bedtime.        Contact information for after-discharge care     Destination     [facility] .   Service: Skilled Nursing Contact information: [address] [place] [address] [phone]                     Discharge Exam: Ceasar Mons Weights   04/20/22 1247  Weight: 63.5 kg   Vitals:   04/29/22 0535 04/29/22 0808  BP: (!) 178/82 (!) 145/54  Pulse: (!) 50 84  Resp: 18 15  Temp: 97.8 F (36.6 C) 98.2 F (36.8 C)  SpO2: 93% 97%   Exam:  Constitutional:  The patient is awake, alert, and oriented x 3. No acute distress. Respiratory:  No increased work of breathing. No wheezes, rales, or rhonchi No tactile fremitus Cardiovascular:  Regular rate and rhythm No murmurs, ectopy, or gallups. No lateral PMI. No thrills. Abdomen:  Abdomen is soft, non-tender, non-distended No hernias, masses, or organomegaly Normoactive bowel sounds.  Musculoskeletal:  No cyanosis, clubbing, or edema Skin:  No rashes, lesions, ulcers palpation of skin: no induration or nodules Neurologic:  CN 2-12 intact Sensation all 4 extremities intact Psychiatric:  Mental status Mood, affect appropriate Orientation to person, place, time  judgment and insight appear intact   Condition at discharge: fair  The results of significant diagnostics from this hospitalization (including imaging, microbiology, ancillary and laboratory) are listed below for reference.   Imaging Studies: CT HEAD WO CONTRAST ( )  Result Date: 04/23/2022 CLINICAL DATA:  Stroke, follow up altered mental status EXAM: CT HEAD WITHOUT CONTRAST TECHNIQUE: Contiguous axial images were obtained from the base of the skull through the vertex without intravenous contrast. RADIATION DOSE REDUCTION: This exam was performed according to the departmental dose-optimization program which includes automated exposure control, adjustment of the mA and/or kV according to patient size and/or use of iterative reconstruction technique. COMPARISON:  Apr 20, 2022. FINDINGS: Brain: Multiple evolving infarcts in the left frontal and right parietal lobes, better characterized on prior MRI. No progressive mass effect or acute hemorrhage. No evidence of  new/interval acute large vascular territory infarct. No hydrocephalus, mass lesion, midline shift or visible extra-axial fluid collection. Cerebral atrophy. Vascular: No hyperdense vessel identified. Skull: No acute fracture. Sinuses/Orbits: Mild paranasal sinus mucosal thickening. No acute orbital findings. Other: No mastoid effusions. IMPRESSION: Multiple evolving infarcts in the left frontal and right parietal lobes, better characterized on prior MRI. No progressive mass effect or acute hemorrhage. No evidence of new/interval acute large vascular territory infarct. Electronically Signed   By: Feliberto Harts M.D.   On: 04/23/2022 15:52   MR BRAIN WO CONTRAST  Result Date: 04/20/2022 CLINICAL DATA:  Stroke suspected EXAM: MRI HEAD WITHOUT CONTRAST TECHNIQUE: Multiplanar, multiecho pulse sequences of the brain and surrounding structures were obtained without intravenous contrast. COMPARISON:  10/16/2021 MRI correlation is also made with CT head 04/20/2022 FINDINGS: Brain: Area of restricted diffusion with ADC correlate in the posterior left frontal cortex and subcortical  white matter measures approximately 1.6 x 2.4 x 3.2 cm (AP x TR x CC) (series 5, image 32 and series 7, image 20). Smaller areas of cortical and subcortical restricted diffusion are also noted in the medial right parietal lobe (series 5, images 28-32). Additional punctate right frontal lobe white matter restricted diffusion (series 5, image 34). These likely represent acute infarcts. Areas with slightly increased signal on diffusion-weighted imaging without strong ADC correlates are noted in the bilateral cerebellar hemispheres (series 5, image 9), likely subacute infarcts. No acute hemorrhage, mass, mass effect, or midline shift. Scattered foci of hemosiderin deposition in the bilateral temporal and occipital lobes, which may represent the sequela of prior hypertensive microhemorrhages. T2 hyperintense signal in the periventricular white  matter, likely the sequela of chronic small vessel ischemic disease. Lacunar infarcts in the bilateral corona radiata and cerebellar hemispheres global cerebral volume loss. Vascular: Normal arterial flow voids. Skull and upper cervical spine: Normal marrow signal. Sinuses/Orbits: Mucosal thickening in the ethmoid air cells. The orbits are unremarkable. Other: The mastoids are well aerated. IMPRESSION: Acute infarcts in the posterior left frontal cortex and subcortical white matter, medial right parietal cortex and subcortical white matter, and right frontal white matter, with additional small subacute infarcts in the bilateral cerebellar hemispheres. No hemorrhage, mass effect, or midline shift. These results will be called to the ordering clinician or representative by the Radiologist Assistant, and communication documented in the PACS or [employer] Dashboard. Electronically Signed   By: Wiliam KeAlison  Vasan M.D.   On: 04/20/2022 22:25   DG Chest Port 1 View  Result Date: 04/23/2022 CLINICAL DATA:  Aspiration into airway.  Hypertension EXAM: PORTABLE CHEST 1 VIEW COMPARISON:  05/11/2021 FINDINGS: Heart is normal size. Interstitial prominence throughout the lungs. Somewhat focal opacities in the right upper lobe and right lower lobe. No effusions. No acute bony abnormality. IMPRESSION: Diffuse interstitial prominence. Patchy opacities in the right lung. Cannot exclude pneumonia. Electronically Signed   By: Charlett NoseKevin  Dover M.D.   On: 04/23/2022 01:05   ECHOCARDIOGRAM COMPLETE  Result Date: 04/22/2022    ECHOCARDIOGRAM REPORT   Patient Name:   Richard CashLARRY L Carter Date of Exam: 04/22/2022 Medical Rec #:  [id number]        Height:       65.0 in Accession #:    [id number]       Weight:       140.0 lb Date of Birth:  02/17/1941         BSA:          1.700 m Patient Age:    80 years         BP:           187/88 mmHg Patient Gender: M                HR:           52 bpm. Exam Location:  [facility] Procedure: 2D Echo, Cardiac Doppler and  Color Doppler Indications:     I63.9 Stroke  History:         Patient has prior history of Echocardiogram examinations.                  Arrythmias:Atrial Fibrillation; Risk Factors:Current Smoker,                  Dyslipidemia and Hypertension.  Sonographer:     Ceasar MonsJennifer Broe Referring Phys:  [id number] TOCHUKWU AGBATA Diagnosing Phys: Marcina MillardAlexander Paraschos MD  Sonographer Comments: Suboptimal parasternal  window and suboptimal apical window. Image acquisition challenging due to uncooperative patient and Image acquisition challenging due to respiratory motion. IMPRESSIONS  1. Left ventricular ejection fraction, by estimation, is 55 to 60%. The left ventricle has normal function. The left ventricle has no regional wall motion abnormalities. Left ventricular diastolic parameters are indeterminate.  2. Right ventricular systolic function is normal. The right ventricular size is normal.  3. The mitral valve is normal in structure. Mild to moderate mitral valve regurgitation. No evidence of mitral stenosis.  4. The aortic valve is normal in structure. Aortic valve regurgitation is not visualized. No aortic stenosis is present.  5. The inferior vena cava is normal in size with greater than 50% respiratory variability, suggesting right atrial pressure of 3 mmHg. FINDINGS  Left Ventricle: Left ventricular ejection fraction, by estimation, is 55 to 60%. The left ventricle has normal function. The left ventricle has no regional wall motion abnormalities. The left ventricular internal cavity size was normal in size. There is  no left ventricular hypertrophy. Left ventricular diastolic parameters are indeterminate. Right Ventricle: The right ventricular size is normal. No increase in right ventricular wall thickness. Right ventricular systolic function is normal. Left Atrium: Left atrial size was normal in size. Right Atrium: Right atrial size was normal in size. Pericardium: There is no evidence of pericardial effusion. Mitral  Valve: The mitral valve is normal in structure. Mild to moderate mitral valve regurgitation. No evidence of mitral valve stenosis. MV peak gradient, 1.3 mmHg. The mean mitral valve gradient is 1.0 mmHg. Tricuspid Valve: The tricuspid valve is normal in structure. Tricuspid valve regurgitation is mild . No evidence of tricuspid stenosis. Aortic Valve: The aortic valve is normal in structure. Aortic valve regurgitation is not visualized. No aortic stenosis is present. Pulmonic Valve: The pulmonic valve was normal in structure. Pulmonic valve regurgitation is not visualized. No evidence of pulmonic stenosis. Aorta: The aortic root is normal in size and structure. Venous: The inferior vena cava is normal in size with greater than 50% respiratory variability, suggesting right atrial pressure of 3 mmHg. IAS/Shunts: No atrial level shunt detected by color flow Doppler.  LEFT VENTRICLE PLAX 2D LVIDd:         3.02 cm   Diastology LVIDs:         2.18 cm   LV e' medial:    4.90 cm/s LV PW:         1.15 cm   LV E/e' medial:  15.3 LV IVS:        1.47 cm   LV e' lateral:   5.00 cm/s LVOT diam:     2.20 cm   LV E/e' lateral: 15.0 LV SV:         61 LV SV Index:   36 LVOT Area:     3.80 cm  RIGHT VENTRICLE RV Basal diam:  2.88 cm RV S prime:     20.40 cm/s LEFT ATRIUM           Index        RIGHT ATRIUM           Index LA diam:      2.30 cm 1.35 cm/m   RA Area:     11.50 cm LA Vol (A4C): 64.9 ml 38.18 ml/m  RA Volume:   19.10 ml  11.24 ml/m  AORTIC VALVE             PULMONIC VALVE LVOT Vmax:   76.10 cm/s  PV Vmax:  0.60 m/s LVOT Vmean:  43.800 cm/s PV Vmean:      46.500 cm/s LVOT VTI:    0.161 m     PV VTI:        0.167 m                          PV Peak grad:  1.4 mmHg AORTA                    PV Mean grad:  1.0 mmHg Ao Root diam: 3.50 cm  MITRAL VALVE MV Area (PHT): 3.31 cm    SHUNTS MV Area VTI:   2.02 cm    Systemic VTI:  0.16 m MV Peak grad:  1.3 mmHg    Systemic Diam: 2.20 cm MV Mean grad:  1.0 mmHg MV Vmax:        0.57 m/s MV Vmean:      45.6 cm/s MV Decel Time: 229 msec MV E velocity: 74.90 cm/s MV A velocity: 60.70 cm/s MV E/A ratio:  1.23 Marcina Millard MD Electronically signed by Marcina Millard MD Signature Date/Time: 04/22/2022/2:11:55 PM    Final    CT HEAD CODE STROKE WO CONTRAST`  Result Date: 04/20/2022 CLINICAL DATA:  Code stroke.  Neuro deficit, acute, stroke suspected EXAM: CT HEAD WITHOUT CONTRAST TECHNIQUE: Contiguous axial images were obtained from the base of the skull through the vertex without intravenous contrast. RADIATION DOSE REDUCTION: This exam was performed according to the departmental dose-optimization program which includes automated exposure control, adjustment of the mA and/or kV according to patient size and/or use of iterative reconstruction technique. COMPARISON:  CT head October 23, 2021. FINDINGS: Brain: New/interval area of hypodensity within the right parietal lobe, suggestive of infarct. Otherwise, similar remote infarcts in the corona radiata and chronic microvascular ischemic disease. No evidence of acute hemorrhage, mass lesion, midline shift, hydrocephalus, or extra-axial fluid collection. Vascular: No hyperdense vessel identified. Skull: No acute fracture. Sinuses/Orbits: Clear sinuses.  No acute orbital findings. Other: No mastoid effusions. IMPRESSION: 1. New/interval area of hypodensity within the right parietal lobe, suggestive of infarct that is otherwise age indeterminate by CT. Consider MRI for more sensitive evaluation. 2. No acute hemorrhage 3. Remote corona radiata infarcts and chronic microvascular ischemic disease Code stroke imaging results were communicated on 04/20/2022 at 3:01 pm to provider [facility] via secure text paging. Electronically Signed   By: Feliberto Harts M.D.   On: 04/20/2022 15:06   [place] EKG SITE RITE  Result Date: 04/25/2022 If Site Rite image not attached, placement could not be confirmed due to current cardiac rhythm.  CT ANGIO HEAD  NECK W WO CM W PERF (CODE STROKE)  Result Date: 04/20/2022 CLINICAL DATA:  Neuro deficit, acute, stroke suspected isolated aphasia EXAM: CT ANGIOGRAPHY HEAD AND NECK CT PERFUSION BRAIN TECHNIQUE: Multidetector CT imaging of the head and neck was performed using the standard protocol during bolus administration of intravenous contrast. Multiplanar CT image reconstructions and MIPs were obtained to evaluate the vascular anatomy. Carotid stenosis measurements (when applicable) are obtained utilizing NASCET criteria, using the distal internal carotid diameter as the denominator. Multiphase CT imaging of the brain was performed following IV bolus contrast injection. Subsequent parametric perfusion maps were calculated using RAPID software. RADIATION DOSE REDUCTION: This exam was performed according to the departmental dose-optimization program which includes automated exposure control, adjustment of the mA and/or kV according to patient size and/or use of iterative reconstruction technique. CONTRAST:   OMNIPAQUE IOHEXOL 350 MG/ML SOLN COMPARISON:  Same day CT head. FINDINGS: CTA NECK FINDINGS Aortic arch: Atherosclerosis of the aorta and great vessel origins. Great vessel origins are patent without significant stenosis. Right carotid system: Atherosclerosis involving the carotid bifurcation and proximal ICA without greater than 50% stenosis. Left carotid system: Extensive atherosclerosis at the carotid bifurcation with severe (approximately 70%) stenosis of the ICA origin. Some hypodensity in this region is favored to represent streak artifact. Vertebral arteries: Left dominant. Patent bilaterally. No evidence of significant (greater than 50%) stenosis. Skeleton: Severe multilevel degenerative change of the cervical spine, including severe multilevel degenerative disc disease and severe multilevel facet/uncovertebral hypertrophy with varying degrees of neural foraminal stenosis. Other neck: No acute findings.  Upper chest: Emphysema. No consolidation the visualized lung apices. Review of the MIP images confirms the above findings CTA HEAD FINDINGS Anterior circulation: Bilateral intracranial ICAs are patent with mild atherosclerotic narrowing. Bilateral MCAs and ACAs are patent without proximal hemodynamically significant stenosis. No aneurysm identified. Posterior circulation: Calcific atherosclerosis of bilateral proximal intradural vertebral arteries. Right intradural vertebral artery is non dominant and terminates as PICA. Moderate atherosclerotic stenosis of the proximal left intradural vertebral artery. Basilar artery and both posterior cerebral arteries are patent with mild atherosclerotic narrowing. Venous sinuses: Poorly assessed due to arterial timing. Anatomic variants: Detailed above. CT Brain Perfusion Findings: CBF (<30%) Volume: 0mL Perfusion (Tmax>6.0s) volume: 0mL Mismatch Volume: 0mL Infarction Location:None identified. IMPRESSION: CTA head: 1. No emergent large vessel occlusion. 2. Moderate proximal left intradural vertebral artery stenosis. CTA neck: 1. Extensive atherosclerosis at the left carotid bifurcation with severe (approximately 70%) stenosis of the left ICA origin. 2. Aortic Atherosclerosis (ICD10-I70.0) and Emphysema (ICD10-J43.9). CT perfusion: No evidence of penumbra or core infarct. Electronically Signed   By: Feliberto Harts M.D.   On: 04/20/2022 15:16   US Abdomen Limited RUQ (LIVER/GB)  Result Date: 04/23/2022 CLINICAL DATA:  Hematemesis EXAM: ULTRASOUND ABDOMEN LIMITED RIGHT UPPER QUADRANT COMPARISON:  None Available. FINDINGS: Gallbladder: No gallstones or wall thickening visualized. No sonographic Murphy sign noted by sonographer. Common bile duct: Diameter: 4 mm Liver: There is increased echogenicity in the liver. No focal abnormality is seen. Portal vein is patent on color Doppler imaging with normal direction of blood flow towards the liver. Other: None. IMPRESSION: Fatty  liver. No other sonographic abnormality is seen in the right upper quadrant. Electronically Signed   By: Ernie Avena M.D.   On: 04/23/2022 11:08    Microbiology: Results for orders placed or performed during the hospital encounter of 04/20/22  Urine Culture     Status: Abnormal   Collection Time: 04/20/22  3:44 PM   Specimen: Urine, Clean Catch  Result Value Ref Range Status   Specimen Description   Final    URINE, CLEAN CATCH Performed at [facility], [address] [place], [place] [address]    Special Requests   Final    NONE Performed at [facility], [address] [place], [place] [address]    Culture >=100,000 COLONIES/mL ESCHERICHIA COLI (A)  Final   Report Status 04/23/2022 FINAL  Final   Organism ID, Bacteria ESCHERICHIA COLI (A)  Final      Susceptibility   Escherichia coli - MIC*    AMPICILLIN 8 SENSITIVE Sensitive     CEFAZOLIN <=4 SENSITIVE Sensitive     CEFEPIME <=0.12 SENSITIVE Sensitive     CEFTRIAXONE <=0.25 SENSITIVE Sensitive     CIPROFLOXACIN >=4 RESISTANT Resistant     GENTAMICIN <=1  SENSITIVE Sensitive     IMIPENEM <=0.25 SENSITIVE Sensitive     NITROFURANTOIN <=16 SENSITIVE Sensitive     TRIMETH/SULFA >=320 RESISTANT Resistant     AMPICILLIN/SULBACTAM 4 SENSITIVE Sensitive     PIP/TAZO <=4 SENSITIVE Sensitive     * >=100,000 COLONIES/mL ESCHERICHIA COLI  MRSA Next Gen by PCR, Nasal     Status: None   Collection Time: 04/23/22 12:50 AM   Specimen: Nasal Mucosa; Nasal Swab  Result Value Ref Range Status   MRSA by PCR Next Gen NOT DETECTED NOT DETECTED Final    Comment: (NOTE) The GeneXpert MRSA Assay (FDA approved for NASAL specimens only), is one component of a comprehensive MRSA colonization surveillance program. It is not intended to diagnose MRSA infection nor to guide or monitor treatment for MRSA infections. Test performance is not FDA approved in patients less than 70 years old. Performed at [facility],  [address] [place], [place] [address]     Labs: CBC: Recent Labs  Lab 04/23/22 0047 04/24/22 0724 04/25/22 0727  WBC 20.3* 14.5* 9.9  HGB 12.3* 11.5* 11.0*  HCT 36.1* 33.6* 32.0*  MCV 108.7* 108.0* 107.7*  PLT 165 158 168   Basic Metabolic Panel: Recent Labs  Lab 04/23/22 0047 04/24/22 0724 04/25/22 0727 04/26/22 0521 04/27/22 0438  NA 139 138 140 143 140  K 4.4 4.1 3.7 3.4* 3.9  CL 105 105 111 114* 109  CO2 25 24 24 26 24   GLUCOSE 112* 73 100* 88 101*  BUN 37* 36* 23 24* 28*  CREATININE 1.14 1.12 0.88 0.95 0.99  CALCIUM 9.4 9.3 9.1 8.8* 9.4  MG  --  1.5* 1.6* 1.9 1.7  PHOS  --  3.1  --   --   --    Liver Function Tests: Recent Labs  Lab 04/23/22 0047  AST 31  ALT 13  ALKPHOS 46  BILITOT 0.9  PROT 6.3*  ALBUMIN 3.2*   CBG: Recent Labs  Lab 04/23/22 0001 04/23/22 0021  GLUCAP 120* 118*    Discharge time spent: greater than 30 minutes.  Signed: Ellise Kovack, DO [facility] 04/29/2022

## 2022-04-30 DIAGNOSIS — I10 Essential (primary) hypertension: Secondary | ICD-10-CM | POA: Diagnosis not present

## 2022-04-30 DIAGNOSIS — Z79899 Other long term (current) drug therapy: Secondary | ICD-10-CM | POA: Diagnosis not present

## 2022-05-01 DIAGNOSIS — F17200 Nicotine dependence, unspecified, uncomplicated: Secondary | ICD-10-CM | POA: Diagnosis not present

## 2022-05-01 DIAGNOSIS — I10 Essential (primary) hypertension: Secondary | ICD-10-CM | POA: Diagnosis not present

## 2022-05-01 DIAGNOSIS — I69354 Hemiplegia and hemiparesis following cerebral infarction affecting left non-dominant side: Secondary | ICD-10-CM | POA: Diagnosis not present

## 2022-05-01 DIAGNOSIS — K922 Gastrointestinal hemorrhage, unspecified: Secondary | ICD-10-CM | POA: Diagnosis not present

## 2022-05-01 DIAGNOSIS — G40909 Epilepsy, unspecified, not intractable, without status epilepticus: Secondary | ICD-10-CM | POA: Diagnosis not present

## 2022-05-01 DIAGNOSIS — I639 Cerebral infarction, unspecified: Secondary | ICD-10-CM | POA: Diagnosis not present

## 2022-05-01 DIAGNOSIS — F1090 Alcohol use, unspecified, uncomplicated: Secondary | ICD-10-CM | POA: Diagnosis not present

## 2022-05-01 DIAGNOSIS — E785 Hyperlipidemia, unspecified: Secondary | ICD-10-CM | POA: Diagnosis not present

## 2022-05-04 DIAGNOSIS — I69354 Hemiplegia and hemiparesis following cerebral infarction affecting left non-dominant side: Secondary | ICD-10-CM | POA: Diagnosis not present

## 2022-05-04 DIAGNOSIS — F0390 Unspecified dementia without behavioral disturbance: Secondary | ICD-10-CM | POA: Diagnosis not present

## 2022-05-04 DIAGNOSIS — Z8673 Personal history of transient ischemic attack (TIA), and cerebral infarction without residual deficits: Secondary | ICD-10-CM | POA: Diagnosis not present

## 2022-05-04 DIAGNOSIS — K219 Gastro-esophageal reflux disease without esophagitis: Secondary | ICD-10-CM | POA: Diagnosis not present

## 2022-05-04 DIAGNOSIS — I10 Essential (primary) hypertension: Secondary | ICD-10-CM | POA: Diagnosis not present

## 2022-05-04 DIAGNOSIS — G40909 Epilepsy, unspecified, not intractable, without status epilepticus: Secondary | ICD-10-CM | POA: Diagnosis not present

## 2022-05-04 DIAGNOSIS — F1090 Alcohol use, unspecified, uncomplicated: Secondary | ICD-10-CM | POA: Diagnosis not present

## 2022-05-04 DIAGNOSIS — F17200 Nicotine dependence, unspecified, uncomplicated: Secondary | ICD-10-CM | POA: Diagnosis not present

## 2022-05-07 DIAGNOSIS — F0390 Unspecified dementia without behavioral disturbance: Secondary | ICD-10-CM | POA: Diagnosis not present

## 2022-05-07 DIAGNOSIS — I69354 Hemiplegia and hemiparesis following cerebral infarction affecting left non-dominant side: Secondary | ICD-10-CM | POA: Diagnosis not present

## 2022-05-07 DIAGNOSIS — I639 Cerebral infarction, unspecified: Secondary | ICD-10-CM | POA: Diagnosis not present

## 2022-05-07 DIAGNOSIS — E785 Hyperlipidemia, unspecified: Secondary | ICD-10-CM | POA: Diagnosis not present

## 2022-05-07 DIAGNOSIS — I480 Paroxysmal atrial fibrillation: Secondary | ICD-10-CM | POA: Diagnosis not present

## 2022-05-07 DIAGNOSIS — I10 Essential (primary) hypertension: Secondary | ICD-10-CM | POA: Diagnosis not present

## 2022-05-07 DIAGNOSIS — Z79899 Other long term (current) drug therapy: Secondary | ICD-10-CM | POA: Diagnosis not present

## 2022-05-07 DIAGNOSIS — K219 Gastro-esophageal reflux disease without esophagitis: Secondary | ICD-10-CM | POA: Diagnosis not present

## 2022-05-22 DIAGNOSIS — R350 Frequency of micturition: Secondary | ICD-10-CM | POA: Diagnosis not present

## 2022-05-22 DIAGNOSIS — R443 Hallucinations, unspecified: Secondary | ICD-10-CM | POA: Diagnosis not present

## 2022-05-22 DIAGNOSIS — R8290 Unspecified abnormal findings in urine: Secondary | ICD-10-CM | POA: Diagnosis not present

## 2022-05-27 ENCOUNTER — Inpatient Hospital Stay
Admission: EM | Admit: 2022-05-27 | Discharge: 2022-05-29 | DRG: 682 | Disposition: A | Discharge status: Skilled Nursing Facility | Payer: Medicare HMO | Source: Skilled Nursing Facility | Attending: Internal Medicine | Admitting: Internal Medicine

## 2022-05-27 ENCOUNTER — Other Ambulatory Visit: Payer: Self-pay

## 2022-05-27 ENCOUNTER — Inpatient Hospital Stay: Payer: Medicare HMO

## 2022-05-27 ENCOUNTER — Emergency Department: Payer: Medicare HMO

## 2022-05-27 ENCOUNTER — Encounter: Payer: Self-pay | Admitting: Emergency Medicine

## 2022-05-27 DIAGNOSIS — I6621 Occlusion and stenosis of right posterior cerebral artery: Secondary | ICD-10-CM | POA: Diagnosis not present

## 2022-05-27 DIAGNOSIS — G9341 Metabolic encephalopathy: Secondary | ICD-10-CM | POA: Diagnosis present

## 2022-05-27 DIAGNOSIS — Z66 Do not resuscitate: Secondary | ICD-10-CM | POA: Diagnosis present

## 2022-05-27 DIAGNOSIS — R29898 Other symptoms and signs involving the musculoskeletal system: Secondary | ICD-10-CM | POA: Diagnosis not present

## 2022-05-27 DIAGNOSIS — I480 Paroxysmal atrial fibrillation: Secondary | ICD-10-CM | POA: Diagnosis present

## 2022-05-27 DIAGNOSIS — R2990 Unspecified symptoms and signs involving the nervous system: Secondary | ICD-10-CM | POA: Diagnosis not present

## 2022-05-27 DIAGNOSIS — Z823 Family history of stroke: Secondary | ICD-10-CM

## 2022-05-27 DIAGNOSIS — Z8673 Personal history of transient ischemic attack (TIA), and cerebral infarction without residual deficits: Secondary | ICD-10-CM

## 2022-05-27 DIAGNOSIS — I69391 Dysphagia following cerebral infarction: Secondary | ICD-10-CM | POA: Diagnosis not present

## 2022-05-27 DIAGNOSIS — N179 Acute kidney failure, unspecified: Principal | ICD-10-CM | POA: Diagnosis present

## 2022-05-27 DIAGNOSIS — R569 Unspecified convulsions: Secondary | ICD-10-CM

## 2022-05-27 DIAGNOSIS — R29810 Facial weakness: Secondary | ICD-10-CM | POA: Diagnosis present

## 2022-05-27 DIAGNOSIS — E43 Unspecified severe protein-calorie malnutrition: Secondary | ICD-10-CM | POA: Diagnosis not present

## 2022-05-27 DIAGNOSIS — K219 Gastro-esophageal reflux disease without esophagitis: Secondary | ICD-10-CM | POA: Diagnosis present

## 2022-05-27 DIAGNOSIS — F0153 Vascular dementia, unspecified severity, with mood disturbance: Secondary | ICD-10-CM | POA: Diagnosis not present

## 2022-05-27 DIAGNOSIS — I6503 Occlusion and stenosis of bilateral vertebral arteries: Secondary | ICD-10-CM | POA: Diagnosis not present

## 2022-05-27 DIAGNOSIS — N390 Urinary tract infection, site not specified: Secondary | ICD-10-CM | POA: Diagnosis present

## 2022-05-27 DIAGNOSIS — F0390 Unspecified dementia without behavioral disturbance: Secondary | ICD-10-CM | POA: Diagnosis not present

## 2022-05-27 DIAGNOSIS — F17210 Nicotine dependence, cigarettes, uncomplicated: Secondary | ICD-10-CM | POA: Diagnosis present

## 2022-05-27 DIAGNOSIS — R531 Weakness: Secondary | ICD-10-CM

## 2022-05-27 DIAGNOSIS — R1310 Dysphagia, unspecified: Secondary | ICD-10-CM | POA: Diagnosis not present

## 2022-05-27 DIAGNOSIS — I639 Cerebral infarction, unspecified: Secondary | ICD-10-CM

## 2022-05-27 DIAGNOSIS — G40909 Epilepsy, unspecified, not intractable, without status epilepticus: Secondary | ICD-10-CM | POA: Diagnosis not present

## 2022-05-27 DIAGNOSIS — I10 Essential (primary) hypertension: Secondary | ICD-10-CM | POA: Diagnosis present

## 2022-05-27 DIAGNOSIS — I69322 Dysarthria following cerebral infarction: Secondary | ICD-10-CM

## 2022-05-27 DIAGNOSIS — R29818 Other symptoms and signs involving the nervous system: Secondary | ICD-10-CM | POA: Diagnosis not present

## 2022-05-27 DIAGNOSIS — I69398 Other sequelae of cerebral infarction: Secondary | ICD-10-CM | POA: Diagnosis not present

## 2022-05-27 DIAGNOSIS — Z515 Encounter for palliative care: Secondary | ICD-10-CM | POA: Diagnosis not present

## 2022-05-27 DIAGNOSIS — I69354 Hemiplegia and hemiparesis following cerebral infarction affecting left non-dominant side: Secondary | ICD-10-CM | POA: Diagnosis not present

## 2022-05-27 DIAGNOSIS — Z91148 Patient's other noncompliance with medication regimen for other reason: Secondary | ICD-10-CM | POA: Diagnosis not present

## 2022-05-27 DIAGNOSIS — R4182 Altered mental status, unspecified: Secondary | ICD-10-CM

## 2022-05-27 DIAGNOSIS — Z809 Family history of malignant neoplasm, unspecified: Secondary | ICD-10-CM

## 2022-05-27 DIAGNOSIS — I6523 Occlusion and stenosis of bilateral carotid arteries: Secondary | ICD-10-CM | POA: Diagnosis not present

## 2022-05-27 DIAGNOSIS — I6502 Occlusion and stenosis of left vertebral artery: Secondary | ICD-10-CM | POA: Diagnosis not present

## 2022-05-27 DIAGNOSIS — B9620 Unspecified Escherichia coli [E. coli] as the cause of diseases classified elsewhere: Secondary | ICD-10-CM | POA: Diagnosis present

## 2022-05-27 DIAGNOSIS — Z79899 Other long term (current) drug therapy: Secondary | ICD-10-CM | POA: Diagnosis not present

## 2022-05-27 DIAGNOSIS — E785 Hyperlipidemia, unspecified: Secondary | ICD-10-CM | POA: Diagnosis present

## 2022-05-27 DIAGNOSIS — Z6821 Body mass index (BMI) 21.0-21.9, adult: Secondary | ICD-10-CM | POA: Diagnosis not present

## 2022-05-27 DIAGNOSIS — N3001 Acute cystitis with hematuria: Secondary | ICD-10-CM | POA: Diagnosis not present

## 2022-05-27 DIAGNOSIS — I4891 Unspecified atrial fibrillation: Secondary | ICD-10-CM | POA: Diagnosis not present

## 2022-05-27 DIAGNOSIS — I771 Stricture of artery: Secondary | ICD-10-CM | POA: Diagnosis not present

## 2022-05-27 LAB — CBC
HCT: 37 % — ABNORMAL LOW (ref 39.0–52.0)
Hemoglobin: 12.2 g/dL — ABNORMAL LOW (ref 13.0–17.0)
MCH: 34.2 pg — ABNORMAL HIGH (ref 26.0–34.0)
MCHC: 33 g/dL (ref 30.0–36.0)
MCV: 103.6 fL — ABNORMAL HIGH (ref 80.0–100.0)
Platelets: 228 10*3/uL (ref 150–400)
RBC: 3.57 MIL/uL — ABNORMAL LOW (ref 4.22–5.81)
RDW: 15.6 % — ABNORMAL HIGH (ref 11.5–15.5)
WBC: 12.1 10*3/uL — ABNORMAL HIGH (ref 4.0–10.5)
nRBC: 0 % (ref 0.0–0.2)

## 2022-05-27 LAB — COMPREHENSIVE METABOLIC PANEL
ALT: 20 U/L (ref 0–44)
AST: 31 U/L (ref 15–41)
Albumin: 3.5 g/dL (ref 3.5–5.0)
Alkaline Phosphatase: 58 U/L (ref 38–126)
Anion gap: 6 (ref 5–15)
BUN: 37 mg/dL — ABNORMAL HIGH (ref 8–23)
CO2: 21 mmol/L — ABNORMAL LOW (ref 22–32)
Calcium: 9.9 mg/dL (ref 8.9–10.3)
Chloride: 110 mmol/L (ref 98–111)
Creatinine, Ser: 1.36 mg/dL — ABNORMAL HIGH (ref 0.61–1.24)
GFR, Estimated: 52 mL/min — ABNORMAL LOW (ref >60)
Glucose, Bld: 93 mg/dL (ref 70–99)
Potassium: 4.3 mmol/L (ref 3.5–5.1)
Sodium: 137 mmol/L (ref 135–145)
Total Bilirubin: 0.8 mg/dL (ref 0.3–1.2)
Total Protein: 7 g/dL (ref 6.5–8.1)

## 2022-05-27 LAB — ETHANOL: Alcohol, Ethyl (B): <10 mg/dL (ref <10)

## 2022-05-27 LAB — DIFFERENTIAL
Abs Immature Granulocytes: 0.06 10*3/uL (ref 0.00–0.07)
Basophils Absolute: 0 10*3/uL (ref 0.0–0.1)
Basophils Relative: 0 %
Eosinophils Absolute: 0.2 10*3/uL (ref 0.0–0.5)
Eosinophils Relative: 1 %
Immature Granulocytes: 1 %
Lymphocytes Relative: 11 %
Lymphs Abs: 1.3 10*3/uL (ref 0.7–4.0)
Monocytes Absolute: 1.1 10*3/uL — ABNORMAL HIGH (ref 0.1–1.0)
Monocytes Relative: 9 %
Neutro Abs: 9.4 10*3/uL — ABNORMAL HIGH (ref 1.7–7.7)
Neutrophils Relative %: 78 %

## 2022-05-27 LAB — URINALYSIS, ROUTINE W REFLEX MICROSCOPIC
Bilirubin Urine: NEGATIVE
Glucose, UA: NEGATIVE mg/dL
Hgb urine dipstick: NEGATIVE
Ketones, ur: NEGATIVE mg/dL
Nitrite: POSITIVE — AB
Protein, ur: 30 mg/dL — AB
Specific Gravity, Urine: 1.024 (ref 1.005–1.030)
WBC, UA: >50 WBC/hpf — ABNORMAL HIGH (ref 0–5)
pH: 5 (ref 5.0–8.0)

## 2022-05-27 LAB — CBG MONITORING, ED: Glucose-Capillary: 90 mg/dL (ref 70–99)

## 2022-05-27 LAB — PROTIME-INR
INR: 1.1 (ref 0.8–1.2)
Prothrombin Time: 14 seconds (ref 11.4–15.2)

## 2022-05-27 LAB — TROPONIN I (HIGH SENSITIVITY)
Troponin I (High Sensitivity): 10 ng/L (ref <18)
Troponin I (High Sensitivity): 10 ng/L (ref <18)

## 2022-05-27 LAB — APTT: aPTT: 31 seconds (ref 24–36)

## 2022-05-27 MED ORDER — ACETAMINOPHEN 325 MG PO TABS
650.0000 mg | ORAL_TABLET | Freq: Four times a day (QID) | ORAL | Status: DC | PRN
  Filled 2022-05-27: qty 2

## 2022-05-27 MED ORDER — ENOXAPARIN SODIUM 40 MG/0.4ML IJ SOSY
40.0000 mg | PREFILLED_SYRINGE | Freq: Every day | INTRAMUSCULAR | Status: DC
  Administered 2022-05-27 – 2022-05-28 (×2): 40 mg via SUBCUTANEOUS
  Filled 2022-05-27 (×2): qty 0.4

## 2022-05-27 MED ORDER — LEVETIRACETAM IN NACL 500 MG/100ML IV SOLN
500.0000 mg | Freq: Two times a day (BID) | INTRAVENOUS | Status: DC
  Administered 2022-05-27 – 2022-05-28 (×2): 500 mg via INTRAVENOUS
  Filled 2022-05-27 (×2): qty 100

## 2022-05-27 MED ORDER — IOHEXOL 350 MG/ML SOLN
100.0000 mL | Freq: Once | INTRAVENOUS | Status: AC | PRN
  Administered 2022-05-27: 100 mL via INTRAVENOUS

## 2022-05-27 MED ORDER — NICOTINE 14 MG/24HR TD PT24
14.0000 mg | MEDICATED_PATCH | Freq: Every day | TRANSDERMAL | Status: DC
  Administered 2022-05-27 – 2022-05-29 (×3): 14 mg via TRANSDERMAL
  Filled 2022-05-27 (×4): qty 1

## 2022-05-27 MED ORDER — SODIUM CHLORIDE 0.9 % IV SOLN
1.0000 g | INTRAVENOUS | Status: DC
  Administered 2022-05-28 – 2022-05-29 (×2): 1 g via INTRAVENOUS
  Filled 2022-05-27: qty 10
  Filled 2022-05-27: qty 1

## 2022-05-27 MED ORDER — ACETAMINOPHEN 650 MG RE SUPP: 650.0000 mg | Freq: Four times a day (QID) | RECTAL | Status: DC | PRN

## 2022-05-27 MED ORDER — SODIUM CHLORIDE 0.9 % IV SOLN
1.0000 g | Freq: Once | INTRAVENOUS | Status: AC
  Administered 2022-05-27: 1 g via INTRAVENOUS
  Filled 2022-05-27: qty 10

## 2022-05-27 MED ORDER — ONDANSETRON HCL 4 MG/2ML IJ SOLN: 4.0000 mg | Freq: Four times a day (QID) | INTRAMUSCULAR | Status: DC | PRN

## 2022-05-27 MED ORDER — SODIUM CHLORIDE 0.9 % IV SOLN: INTRAVENOUS | Status: DC

## 2022-05-27 MED ORDER — SENNA 8.6 MG PO TABS
1.0000 | ORAL_TABLET | Freq: Two times a day (BID) | ORAL | Status: DC
  Administered 2022-05-27 – 2022-05-29 (×4): 8.6 mg via ORAL
  Filled 2022-05-27 (×4): qty 1

## 2022-05-27 MED ORDER — ONDANSETRON HCL 4 MG PO TABS: 4.0000 mg | ORAL_TABLET | Freq: Four times a day (QID) | ORAL | Status: DC | PRN

## 2022-05-27 NOTE — ED Notes (Signed)
Recollect blue and green top sent to lab

## 2022-05-27 NOTE — Assessment & Plan Note (Signed)
-  Continue Lipitor °

## 2022-05-27 NOTE — Assessment & Plan Note (Signed)
Baseline dementia appears fairly advanced of the baseline unclear in this acute setting.  No family or caregivers were at bedside on admission encounter. -- Delirium precautions

## 2022-05-27 NOTE — Assessment & Plan Note (Addendum)
Presented with altered mental status, not acting usual self, SNF staff or caregiver felt speech was slurred.  Had acute CVA one month ago. CT head negative. --Neurology following --MRI brain pending. --PT/OT/SLP evaluations --Full dose ASA daily --Continue Lipitor --Resume Eliquis after MRI and okay from neurology standpoint

## 2022-05-27 NOTE — Assessment & Plan Note (Signed)
Presented with altered mental status and UA consistent with infection.  Mild leukocytosis but no other SIRS criteria, patient not septic. --Continue empiric Rocephin. --Follow urine cultures.

## 2022-05-27 NOTE — Assessment & Plan Note (Addendum)
Hold any antihypertensives for permissive HTN until stroke ruled out.

## 2022-05-27 NOTE — ED Notes (Signed)
Pt back from CT and remains on continuous monitoring.

## 2022-05-27 NOTE — Progress Notes (Signed)
CODE STROKE- PHARMACY COMMUNICATION   Time CODE STROKE called/page received: [phone]  Time response to CODE STROKE was made (in person or via phone): Immediately  Time Stroke Kit retrieved from [place] (only if needed): N/A, patient is not a candidate for TNK (outside window)  Name of Provider/Nurse contacted: Dr. Reeves Forth  Past Medical History:  Diagnosis Date   Alcohol abuse    Dementia ([place])    GERD (gastroesophageal reflux disease)    HLD (hyperlipidemia)    HTN (hypertension)    Seizures ([place])    Stroke ([place])    Benita Gutter 05/27/2022  9:00 AM

## 2022-05-27 NOTE — Evaluation (Signed)
Physical Therapy Evaluation Patient Details Name: Richard Carter MRN: [id number] DOB: 1941-03-29 Today's Date: 05/27/2022  History of Present Illness  Pt is a 81 y.o. male with a history of CVA, paroxysmal atrial fibrillation, seizure disorder, hypertension, alcohol abuse and dementia who presents to ED 05/27/22 with generalized weakness and altered mental status as well as possible facial droop. MD assessment includes Urinary tract infection without hematuria and  Altered mental status  Clinical Impression  Pt very lethargic and orientated name only and requiring max physical and verbal stimulus to awake. Upon sitting up pt became more alert and able to complete very few simple one step commands and needing physical cues to initiate mobility tasks. Pt was able to complete bed mobility with +2 min-modA for trunk control and leg management. Pt is able to initiate sit to stand from elevated bed but required +2 minA to prevent posterior LOB. Attempted to ambulate with very small attempts to initiate steps but with severe posterior LOB and difficulty following commands to initiate steps. Pt will benefit from PT services in a SNF setting upon discharge to safely address deficits listed in patient problem list for decreased caregiver assistance and eventual return to PLOF.      Recommendations for follow up therapy are one component of a multi-disciplinary discharge planning process, led by the attending physician.  Recommendations may be updated based on patient status, additional functional criteria and insurance authorization.  Follow Up Recommendations Skilled nursing-short term rehab (<3 hours/day) Can patient physically be transported by private vehicle: No    Assistance Recommended at Discharge Frequent or constant Supervision/Assistance  Patient can return home with the following  Two people to help with walking and/or transfers;A lot of help with bathing/dressing/bathroom;Assistance with  cooking/housework;Direct supervision/assist for medications management;Assist for transportation;Help with stairs or ramp for entrance    Equipment Recommendations None recommended by PT  Recommendations for Other Services       Functional Status Assessment Patient has had a recent decline in their functional status and demonstrates the ability to make significant improvements in function in a reasonable and predictable amount of time.     Precautions / Restrictions Precautions Precautions: Fall Restrictions Weight Bearing Restrictions: No      Mobility  Bed Mobility Overal bed mobility: Needs Assistance Bed Mobility: Supine to Sit, Sit to Supine     Supine to sit: Min assist, Mod assist, +2 for physical assistance Sit to supine: Min assist, Mod assist, +2 for physical assistance   General bed mobility comments: +2 min - modA for trunk control and leg management    Transfers Overall transfer level: Needs assistance Equipment used: Rolling walker (2 wheels) Transfers: Sit to/from Stand Sit to Stand: +2 physical assistance, Min assist, From elevated surface           General transfer comment: able to initate stand from elevated surface but requires +2 minA to prevent posterior LOB once up    Ambulation/Gait Ambulation/Gait assistance: +2 physical assistance, Mod assist   Assistive device: Rolling walker (2 wheels)         General Gait Details: attempted w/ very small attempts to initiate step but with severe posterior LOB  Stairs            Wheelchair Mobility    Modified Rankin (Stroke Patients Only)       Balance Overall balance assessment: Needs assistance Sitting-balance support: Bilateral upper extremity supported, Feet unsupported Sitting balance-Leahy Scale: Fair     Standing balance support: Bilateral  upper extremity supported, During functional activity, Reliant on assistive device for balance Standing balance-Leahy Scale: Poor                                Pertinent Vitals/Pain Pain Assessment Pain Assessment: No/denies pain    Home Living Family/patient expects to be discharged to:: Private residence Living Arrangements: Other (Comment) (lives w/ caretaker) Available Help at Discharge: Available 24 hours/day;Personal care attendant Type of Home: Mobile home Home Access: Stairs to enter Entrance Stairs-Rails: Can reach both Entrance Stairs-Number of Steps: 2   Home Layout: One level Home Equipment: Grab bars - tub/shower;Shower seat;Grab bars - Curator (2 wheels);BSC/3in1      Prior Function               Mobility Comments: Household CGA ambulator using RW and assist w/ STS from caregiver; 1 hx of falls in which pt slipped out of bed during rehab stay. ADLs Comments: Caregiver assist for ADLs     Hand Dominance        Extremity/Trunk Assessment   Upper Extremity Assessment Upper Extremity Assessment: Generalized weakness    Lower Extremity Assessment Lower Extremity Assessment: Generalized weakness       Communication   Communication: Other (comment);Receptive difficulties;Expressive difficulties (AMS)  Cognition Arousal/Alertness: Lethargic Behavior During Therapy: Flat affect Overall Cognitive Status: Impaired/Different from baseline Area of Impairment: Orientation, Following commands, Attention                               General Comments: Oriented to name only; information on living situation per chart review and caregiver in room        General Comments      Exercises     Assessment/Plan    PT Assessment Patient needs continued PT services  PT Problem List Decreased strength;Decreased mobility;Decreased activity tolerance;Decreased balance;Decreased knowledge of use of DME;Decreased cognition       PT Treatment Interventions DME instruction;Therapeutic exercise;Balance training;Gait training;Stair training;Functional mobility  training;Therapeutic activities;Patient/family education    PT Goals (Current goals can be found in the Care Plan section)  Acute Rehab PT Goals Patient Stated Goal: not stated PT Goal Formulation: With patient Time For Goal Achievement: 06/09/22 Potential to Achieve Goals: Fair    Frequency Min 2X/week     Co-evaluation               AM-PAC PT "6 Clicks" Mobility  Outcome Measure Help needed turning from your back to your side while in a flat bed without using bedrails?: A Lot Help needed moving from lying on your back to sitting on the side of a flat bed without using bedrails?: A Lot Help needed moving to and from a bed to a chair (including a wheelchair)?: A Lot Help needed standing up from a chair using your arms (e.g., wheelchair or bedside chair)?: A Lot Help needed to walk in hospital room?: Total Help needed climbing 3-5 steps with a railing? : Total 6 Click Score: 10    End of Session Equipment Utilized During Treatment: Gait belt Activity Tolerance: Patient tolerated treatment well Patient left: in bed;with call bell/phone within reach;with family/visitor present;with nursing/sitter in room Nurse Communication: Mobility status PT Visit Diagnosis: Unsteadiness on feet (R26.81);Difficulty in walking, not elsewhere classified (R26.2);Muscle weakness (generalized) (M62.81);History of falling (Z91.81)    Time: 6269-4854 PT Time Calculation (min) (ACUTE ONLY): 23 min  Charges:              Marica Otter, SPT  05/27/2022, 4:34 PM

## 2022-05-27 NOTE — Assessment & Plan Note (Signed)
HR controlled and intermittently bradycardic.   --Telemetry --Eliquis on hold pending MRI for stroke evaluation --Appears not on rate control agent likely due to bradycardia

## 2022-05-27 NOTE — ED Notes (Addendum)
Pt transported to MRI. Family remains in room.

## 2022-05-27 NOTE — ED Provider Notes (Signed)
[facility] Provider Note    Event Date/Time   First MD Initiated Contact with Patient 05/27/22 [phone]     (approximate)   History   Facial Droop   HPI  Level 5 caveat: HPI limited due to dementia  Richard Carter is a 81 y.o. male with a history of CVA, paroxysmal atrial fibrillation, seizure disorder, hypertension, alcohol abuse and dementia who presents with generalized weakness and altered mental status as well as possible facial droop first noted when the patient awoke this morning.  He was last seen at his baseline yesterday at 9 PM.  The patient himself is unable to give any history.  He is currently being treated with antibiotics for UTI and got his last dose this morning.     Physical Exam   Triage Vital Signs: ED Triage Vitals  Enc Vitals Group     BP 05/27/22 0832 (!) 122/97     Pulse Rate 05/27/22 0832 79     Resp 05/27/22 0832 16     Temp 05/27/22 0832 97.7 F (36.5 C)     Temp src --      SpO2 05/27/22 0832 94 %     Weight 05/27/22 0831 130 lb (59 kg)     Height 05/27/22 0831 5\' 5"  (1.651 m)     Head Circumference --      Peak Flow --      Pain Score 05/27/22 0831 0     Pain Loc --      Pain Edu? --      Excl. in GC? --     Most recent vital signs: Vitals:   05/27/22 1130 05/27/22 1200  BP: (!) 147/73 137/75  Pulse: (!) 56 68  Resp:  20  Temp:    SpO2: 100% 100%     General: Somnolent but arousable, confused. CV:  Good peripheral perfusion.  Resp:  Normal effort.  Abd:  Soft and nontender.  No distention.  Other:  EOMI.  PERRLA.  No significant facial droop.  Right arm appears contracted but the patient is moving it.  Motor intact in all extremities.  Somewhat dry mucous membranes.   ED Results / Procedures / Treatments   Labs (all labs ordered are listed, but only abnormal results are displayed) Labs Reviewed  CBC - Abnormal; Notable for the following components:      Result Value   WBC 12.1 (*)    RBC 3.57  (*)    Hemoglobin 12.2 (*)    HCT 37.0 (*)    MCV 103.6 (*)    MCH 34.2 (*)    RDW 15.6 (*)    All other components within normal limits  DIFFERENTIAL - Abnormal; Notable for the following components:   Neutro Abs 9.4 (*)    Monocytes Absolute 1.1 (*)    All other components within normal limits  URINALYSIS, ROUTINE W REFLEX MICROSCOPIC - Abnormal; Notable for the following components:   Color, Urine YELLOW (*)    APPearance HAZY (*)    Protein, ur 30 (*)    Nitrite POSITIVE (*)    Leukocytes,Ua LARGE (*)    WBC, UA >50 (*)    Bacteria, UA MANY (*)    All other components within normal limits  COMPREHENSIVE METABOLIC PANEL - Abnormal; Notable for the following components:   CO2 21 (*)    BUN 37 (*)    Creatinine, Ser 1.36 (*)    GFR, Estimated 52 (*)  All other components within normal limits  ETHANOL  PROTIME-INR  APTT  CBG MONITORING, ED  TROPONIN I (HIGH SENSITIVITY)  TROPONIN I (HIGH SENSITIVITY)     EKG  ED ECG REPORT I, Dionne Bucy, the attending physician, personally viewed and interpreted this ECG.  Date: 05/27/2022 EKG Time: 0836 Rate: 71 Rhythm: normal sinus rhythm, prolonged PR QRS Axis: normal Intervals: LAFB ST/T Wave abnormalities: LVH with repolarization abnormality Narrative Interpretation: Nonspecific abnormalities with no evidence of acute ischemia    RADIOLOGY  CT head: I independently viewed and interpreted the images; there is no ICH or evidence of acute infarct  CT angio head/neck: No LVO  PROCEDURES:  Critical Care performed: Yes, see critical care procedure note(s)  .Critical Care  Performed by: Dionne Bucy, MD Authorized by: Dionne Bucy, MD   Critical care provider statement:    Critical care time (minutes):  15   Critical care was time spent personally by me on the following activities:  Development of treatment plan with patient or surrogate, discussions with consultants, evaluation of patient's  response to treatment, examination of patient, ordering and review of laboratory studies, ordering and review of radiographic studies, ordering and performing treatments and interventions, pulse oximetry, re-evaluation of patient's condition and review of old charts   Care discussed with: admitting provider      MEDICATIONS ORDERED IN ED: Medications  cefTRIAXone (ROCEPHIN) 1 g in sodium chloride 0.9 % 100 mL IVPB (1 g Intravenous New Bag/Given 05/27/22 1206)  iohexol (OMNIPAQUE) 350 MG/ML injection 100 mL (100 mLs Intravenous Contrast Given 05/27/22 0905)     IMPRESSION / MDM / ASSESSMENT AND PLAN / ED COURSE  I reviewed the triage vital signs and the nursing notes.  81 year old male with PMH as noted above presents with altered mental status and lethargy since he awoke this morning, concern for possible right-sided facial droop and arm weakness although this is not clear.  He was last seen normal yesterday at 9 PM.  I reviewed the past medical records.  The patient was just admitted in May/early June.  Per the hospitalist discharge summary from 6/5 he presented with dysarthria and was found to have an acute embolic stroke and severe left ICA stenosis as well as an acute UTI.  He then developed acute GI bleeding and aspiration pneumonia.  On exam today the patient is somewhat somnolent but arousable and moans and mumbles to painful stimuli.  His vital signs are normal.  He is moving all extremities.  Neurologic exam is nonfocal at this time.  Differential diagnosis includes, but is not limited to, continued/recurrent UTI, pneumonia, other infection, dehydration, electrolyte abnormality, AKI, other metabolic disturbance, or less likely acute CVA.  Code stroke was activated by the charge RN.  Patient's presentation is most consistent with acute presentation with potential threat to life or bodily function.  We will obtain emergent CT, lab work-up, consult with neurology, and reassess.  The  patient is on the cardiac monitor to evaluate for evidence of arrhythmia and/or significant heart rate changes.  ----------------------------------------- 10:18 AM on 05/27/2022 -----------------------------------------  The patient was evaluated by Dr. Viviann Spare from neurology who I consulted with; he has canceled the code stroke.  CT and CTA are negative for acute findings.  Lab work-up is pending.  ----------------------------------------- 12:18 PM on 05/27/2022 -----------------------------------------  Urinalysis is consistent with UTI.  Although the family stated that they were giving the patient antibiotics, the discharge summary from the most recent hospitalization indicates that antibiotic treatment was completed  during that hospitalization.  The patient also has an elevated WBC count, but the other labs are reassuring.  His vital signs are stable.  I reviewed the most recent urine cultures which showed broadly sensitive E. coli, and ordered IV ceftriaxone.  I consulted Dr. Denton Lank from the hospitalist service; based on our discussion she agrees to admit the patient.   FINAL CLINICAL IMPRESSION(S) / ED DIAGNOSES   Final diagnoses:  Urinary tract infection without hematuria, site unspecified  Altered mental status, unspecified altered mental status type     Rx / DC Orders   ED Discharge Orders     None        Note:  This document was prepared using Dragon voice recognition software and may include unintentional dictation errors.    Dionne Bucy, MD 05/27/22 1220

## 2022-05-27 NOTE — ED Notes (Signed)
Family now at bedside with neurologist

## 2022-05-27 NOTE — Progress Notes (Signed)
   05/27/22 0910  Clinical Encounter Type  Visited With Patient not available  Visit Type Code   Chaplain Alby Schwabe present on unit; pt still in CT or receiving further medical assessment. Will continue to remain available for support as needed. Will f/u to assess needs.

## 2022-05-27 NOTE — ED Triage Notes (Signed)
Pt via POV from home. Pt was seen at his doctor appt and states they he may possibly be having a stroke. States that pt woke up this morning with facial droop, slurred speech and altered mental status. LKW was last night around 0900. Denies any pain. Pt states he has had a stroke in the past. Family states he just been out of it. During triage, pt is dozing off and very lethargic. Pt is A&Ox4 and NAD.

## 2022-05-27 NOTE — Assessment & Plan Note (Addendum)
Initially on Keppra IV, taking p.o. now.  Resume oral Keppra 500 mg twice daily.

## 2022-05-27 NOTE — Consult Note (Addendum)
Stroke Neurology Consultation Note  Consult Requested by: Dionne Bucy, MD  Reason for Consult: stroke  Consult Date: 05/27/22   The history was obtained from the ER, EMS.  During history and examination, all items were  able to obtain unless otherwise noted.  History of Present Illness:  Richard Carter is a 81 y.o. Caucasian male with PMH of seizure, HTN, Dementia CVA (with no residual deficit per nursing home).  His stroke was about a month ago on June 3 and admitted to [facility].  At that time he only had speech deficit which fully resolved.  He was on Eliquis and per the caretaker this was stopped for unknown reasons.  There is some confusion about whether he should have been on aspirin but he is not on it currently.  Patient lives in a home and the caretaker lives with him.  His son is also in the ER but does not know about his father's medical history.  I called his other son but he is currently in [place] and deferred to his caretaker.  Per the caretaker he went to bed around 9 PM woke up around 3 in the morning and said he could not sleep which was unusual normally he sleeps through the night.  He was recently diagnosed with a UTI and put on antibiotics.  He also takes trazodone at night to help him sleep.  Per caretaker he usually drinks 2 to 3 ounces of whiskey a day but stopped smoking and drinking after his stroke about a month ago.    I evaluated the patient initially in CT scanner then reevaluated later on in the ED.  It was delay in getting IV access and ultimately decision was made not to pursue CT perfusion as CTA on my review was negative.  LSN: [id number] tPA Given: No: out of the time window and on eliquis per chart.  However per caretaker he has not getting this. Difficulty getting 18G per CT tech/ER nurse. Will defer CT perfusion.   mRS: 3. Able to get around without assistance. Requires full time caretaker at home for dementia.  Past Medical History:  Diagnosis Date   Alcohol  abuse    Dementia (HCC)    GERD (gastroesophageal reflux disease)    HLD (hyperlipidemia)    HTN (hypertension)    Seizures (HCC)    Stroke [facility])     Past Surgical History:  Procedure Laterality Date   skin cyst removal      Family History  Problem Relation Age of Onset   Stroke Mother    Cancer Brother     Social History:  reports that he has been smoking cigarettes. He has a 34.00 pack-year smoking history. He has never used smokeless tobacco. He reports current alcohol use. He reports that he does not use drugs.  Allergies: No Known Allergies  No current facility-administered medications on file prior to encounter.   Current Outpatient Medications on File Prior to Encounter  Medication Sig Dispense Refill   amLODipine (NORVASC) 5 MG tablet Take 1 tablet (5 mg total) by mouth daily.     atorvastatin (LIPITOR) 80 MG tablet Take 1 tablet (80 mg total) by mouth daily. 30 tablet 0   food thickener (SIMPLYTHICK, NECTAR/LEVEL 2/MILDLY THICK,) GEL Take 1 packet by mouth as needed. 100 packet 0   levETIRAcetam (KEPPRA) 500 MG tablet Take 1 tablet (500 mg total) by mouth 2 (two) times daily. 60 tablet 3   losartan (COZAAR) 50 MG tablet Take 50  mg by mouth daily.     magnesium oxide (MAG-OX) 400 MG tablet Take 400 mg by mouth daily.     metoprolol succinate (TOPROL-XL) 25 MG 24 hr tablet Take 0.5 tablets (12.5 mg total) by mouth daily. 15 tablet 0   Multiple Vitamin (MULTIVITAMIN WITH MINERALS) TABS tablet Take 1 tablet by mouth daily. 30 tablet 0   nicotine (NICODERM CQ - DOSED IN MG/24 HOURS) 14 mg/24hr patch Place 1 patch (14 mg total) onto the skin daily. 28 patch 0   Nutritional Supplements (FEEDING SUPPLEMENT, NEPRO CARB STEADY,) LIQD Take 237 mLs by mouth 3 (three) times daily between meals. 237 mL 0   pantoprazole (PROTONIX) 40 MG tablet Take 1 tablet (40 mg total) by mouth 2 (two) times daily. After sixty days the patient is to continue protonix at 40 mg daily. 120 tablet 0    thiamine 100 MG tablet Take 1 tablet (100 mg total) by mouth daily. 30 tablet 0   traZODone (DESYREL) 100 MG tablet Take 1 tablet (100 mg total) by mouth at bedtime.      Review of Systems: A full ROS was attempted today and was not obtainable due to altered mental status  Physical Examination: Temp:  [97.7 F (36.5 C)] 97.7 F (36.5 C) (07/03 0832) Pulse Rate:  [79] 79 (07/03 0832) Resp:  [16] 16 (07/03 0832) BP: (122)/(97) 122/97 (07/03 0832) SpO2:  [94 %] 94 % (07/03 0832) Weight:  [59 kg] 59 kg (07/03 0831)  General -frail appearing gentleman who is difficult to arouse  Ophthalmologic - fundi not visualized due to noncooperation.    Cardiovascular - regular rhythm and rate  Mental Status -  Level of arousal is sleepy arousable with max stiumulation. Will open eyes to voice briefly. Oriented to name only.  Speech is dysarthric. will fall asleep as soon as stiumulation stopped.  He will attempt to open his eyes when asked.  He stuck his tongue out for me.  He is able to lift his arms up to command but did not follow other commands.  Unable to identify objects. Cranial Nerves II - XII - II - Vision intact OU blink to threat bilaterally III, IV, VI -no gaze deviation. VII -no obvious facial droop noted on my exam VIII - Hearing & vestibular intact bilaterally to voice X -not able to examine due to altered mental status XI -unable to examine due to out from the status XII - Tongue protrusion intact  Motor Strength -able to lift all extremities off the bed and hold them there. Normal muscle tone.  Reflexes -nonpathologic Sensory -withdraw in all 4 extremities to pain  Coordination -unable to examine due to altered mental status Gait and Station - deferred NIHSS: 6.  Interval: Initial (07/03 0931) Level of Consciousness (1a.)   : Not alert, but arousable by minor stimulation to obey, answer, or respond (07/03 0931) LOC Questions (1b. )   +: Answers neither question correctly  (07/03 0931) LOC Commands (1c. )   + : Performs both tasks correctly (07/03 0931) Best Gaze (2. )  +: Normal (07/03 0931) Visual (3. )  +: No visual loss (07/03 0931) Facial Palsy (4. )    : Minor paralysis (07/03 0931) Motor Arm, Left (5a. )   +: No drift (07/03 0931) Motor Arm, Right (5b. )   +: No drift (07/03 0931) Motor Leg, Left (6a. )   +: No drift (07/03 0931) Motor Leg, Right (6b. )   +: No drift (  07/03 0931) Limb Ataxia (7. ): Absent (07/03 0931) Sensory (8. )   +: Normal, no sensory loss (07/03 0931) Best Language (9. )   +: No aphasia (07/03 0931) Dysarthria (10. ): Normal (07/03 0931) Extinction/Inattention (11.)   +: No Abnormality (07/03 0931) Modified SS Total  +: 2 (07/03 0931) Complete NIHSS TOTAL: 4 (07/03 0931)   Data Reviewed: CT head negative for bleed significant atrophy noted. CTA on my read is negative for LVO. CT perfusion not able to get 18-gauge and deferred due to low suspicion.  Assessment: 82 y.o. male with history of dementia recently on antibiotics for UTI, history of seizures on Keppra, atrial fibrillation.  Code stroke activation however his presentation appears to be more consistent with altered mental status possible sepsis and not a code stroke.  This was relayed to the ER physician.  Patient's caretaker and son were also updated.  Upon further review of his medical chart it appears that he was discharged on 6/5 with a new stroke on MRI in the left frontal cortex and right parietal cortex.  He had a GI bleed and Eliquis was held for this reason however was recommended to resume but patient did not restart this nor did he take aspirin.  The Eliquis was for history of atrial fibrillation and was also noted that he is noncompliant with this.  He was seen by GI during the hospitalization for hematemesis single episode they felt likely due to NSAID use and history of alcohol abuse.  They deferred further work-up.    Stroke Risk Factors - hyperlipidemia,  hypertension, and smoking afib.  Plan: He will need admission for altered mental status work-up infection versus metabolic versus medication -- Recent admission for stroke work-up completed including echo within a month. MRI  of the brain without contrast only -If there is no improvement in  mental status consider routine EEG - PT consult, OT consult, Speech consult -Stat aspirin 325 mg p.o. or 300 rectally in the ED -Can resume Eliquis 5 mg twice daily for stroke prevention after MRI is completed and reviewed by neurology -He will need to follow-up with neurology outpatient  -Thank you for this consultation and allowing [place] to participate in the care of this patient.   Plan discussed with the ED physician.   This patient is critically ill due to evaluation due to acute stroke activation, reviewing CT and CTA scans looking for hemorrhage and large vessel occlusion and at significant risk of neurological worsening, death form heart failure, respiratory failure, recurrent stroke, bleeding from [facility], seizure, sepsis. This patient's care requires constant monitoring of vital signs, hemodynamics, respiratory and cardiac monitoring, review of multiple databases, neurological assessment, discussion with family, other specialists and medical decision making of high complexity. I spent 45 minutes of neurocritical care time in the care of this patient.   Camylle Whicker,MD

## 2022-05-27 NOTE — ED Notes (Signed)
RN and CT tech unable to obtain 18g for CT perfusion

## 2022-05-27 NOTE — Progress Notes (Signed)
   05/27/22 1020  Clinical Encounter Type  Visited With Patient and family together  Visit Type Code;Spiritual support;Social support  Spiritual Encounters  Spiritual Needs Prayer   Chaplain Yatziri Wainwright f/u with pt and family had arrived (sister, Liborio Nixon). Chaplain B offered support to her and established a relationship of care and learned more about the family.  Pt's son, Trey Paula, and caregiver, [name], arrived and this chaplain engaged them to offer support. Chaplain B offered prayer at family request and offered compassionate presence. Pt sleeping during this visit, no distress apparent.   Chaplain B offered to f/u later to check on Mr. Deshmukh. Family well situated; no further needs at this time.

## 2022-05-27 NOTE — Assessment & Plan Note (Addendum)
Superimposed on baseline dementia. Presented as code stroke but stroke eval negative including MRI brain.  Found to have UTI. Patient is lethargic and minimally responsive. --Treat UTI as outlined -- Delirium precautions -- Stroke work-up complete and negative

## 2022-05-27 NOTE — H&P (Addendum)
History and Physical    Patient: Richard Carter HDQ:[id number] DOB: Oct 29, 1941 DOA: 05/27/2022 DOS: the patient was seen and examined on 05/27/2022 PCP: Marisue Ivan, MD  Patient coming from: SNF  Chief Complaint:  Chief Complaint  Patient presents with   Facial Droop   HPI: Richard Carter is a 81 y.o. male with medical history significant of dementia, recent stroke last month, paroxysmal A-fib seizure disorder on Keppra, hypertension, hyperlipidemia, GERD, history of alcohol and tobacco abuse presented from his facility today with altered mental status and concern for strokelike symptoms and generalized weakness.  History is limited due to patient's dementia and current mental status.  No family or caregivers at bedside during admission encounter.  History obtained by chart review and from EDP.  Per reports, facility staff noted concern for some slurred speech and a possible facial droop when he woke up this morning.  He apparently had a hard time sleeping last night and was not acting himself.  Reportedly had recently finished course of antibiotics for UTI.  Presented to the ED as a code stroke by neurology.  His last known well time was 9 PM yesterday, out of the window for tPA.  Noncontrast head CT with no evidence of acute intracranial abnormalities, moderate cerebral atrophy prior infarcts as outlined in the full report chronic small vessel disease.  CTA is minimal to for large vessel occlusion.    In the ED, vitals overall stable, intermittently bradycardic with heart rate in the 50s, BP controlled 122/97 later 150/74, afebrile, without hypoxia.  Labs notable for bicarb 21, BUN 37, creatinine 1.36 up from 0.99 On on 04/27/2022.  CBC with mild leukocytosis 12.1, hemoglobin 12.2 urinalysis was grossly positive for infection with large leukocytes, positive nitrite, many bacteria and greater than 50 WBCs.  Started on IV Rocephin with urine culture sent.  Alcohol level was negative.  Admitted  to medicine service for remainder of stroke evaluation and continue on IV antibiotics for UTI pending culture results.   Review of Systems: unable to review all systems due to the inability of the patient to answer questions. Past Medical History:  Diagnosis Date   Alcohol abuse    Dementia (HCC)    GERD (gastroesophageal reflux disease)    HLD (hyperlipidemia)    HTN (hypertension)    Seizures (HCC)    Stroke [facility])    Past Surgical History:  Procedure Laterality Date   skin cyst removal     Social History:  reports that he has been smoking cigarettes. He has a 34.00 pack-year smoking history. He has never used smokeless tobacco. He reports current alcohol use. He reports that he does not use drugs.  No Known Allergies  Family History  Problem Relation Age of Onset   Stroke Mother    Cancer Brother     Prior to Admission medications   Medication Sig Start Date End Date Taking? Authorizing Provider  amLODipine (NORVASC) 5 MG tablet Take 1 tablet (5 mg total) by mouth daily. 05/12/21  Yes Sreenath, Sudheer B, MD  atorvastatin (LIPITOR) 20 MG tablet Take 20 mg by mouth daily.   Yes [provider]  ciprofloxacin (CIPRO) 500 MG tablet Take 500 mg by mouth 2 (two) times daily. 05/22/22  Yes [provider]  levETIRAcetam (KEPPRA) 500 MG tablet Take 1 tablet (500 mg total) by mouth 2 (two) times daily. 12/01/20  Yes Danford, Earl Lites, MD  losartan (COZAAR) 50 MG tablet Take 50 mg by mouth daily. 02/25/22  Yes [provider]  metoprolol succinate (TOPROL-XL) 25 MG 24 hr tablet Take 0.5 tablets (12.5 mg total) by mouth daily. 04/30/22  Yes Swayze, Ava, DO  Multiple Vitamin (MULTIVITAMIN WITH MINERALS) TABS tablet Take 1 tablet by mouth daily. 04/30/22  Yes Swayze, Ava, DO  pantoprazole (PROTONIX) 40 MG tablet Take 1 tablet (40 mg total) by mouth 2 (two) times daily. After sixty days the patient is to continue protonix at 40 mg daily. 04/29/22 06/28/22 Yes Swayze, Ava,  DO  thiamine 100 MG tablet Take 1 tablet (100 mg total) by mouth daily. 04/30/22  Yes Swayze, Ava, DO  traZODone (DESYREL) 100 MG tablet Take 1 tablet (100 mg total) by mouth at bedtime. 05/11/21  Yes Sreenath, Sudheer B, MD  atorvastatin (LIPITOR) 80 MG tablet Take 1 tablet (80 mg total) by mouth daily. Patient not taking: Reported on 05/27/2022 04/30/22   Swayze, Ava, DO  food thickener (SIMPLYTHICK, NECTAR/LEVEL 2/MILDLY THICK,) GEL Take 1 packet by mouth as needed. 04/29/22   Swayze, Ava, DO  magnesium oxide (MAG-OX) 400 MG tablet Take 400 mg by mouth daily.    [provider]  nicotine (NICODERM CQ - DOSED IN MG/24 HOURS) 14 mg/24hr patch Place 1 patch (14 mg total) onto the skin daily. 04/30/22   Swayze, Ava, DO  Nutritional Supplements (FEEDING SUPPLEMENT, NEPRO CARB STEADY,) LIQD Take 237 mLs by mouth 3 (three) times daily between meals. 04/29/22   Swayze, Ava, DO  thiamine (VITAMIN B-1) 100 MG tablet Take 100 mg by mouth daily. 04/29/22   [provider]    Physical Exam: Vitals:   05/27/22 1200 05/27/22 1330 05/27/22 1500 05/27/22 1519  BP: 137/75 (!) 147/73 (!) 152/79   Pulse: 68 (!) 46 (!) 50   Resp: 20 20    Temp:    97.7 F (36.5 C)  TempSrc:    Oral  SpO2: 100% 98% 99%   Weight:      Height:       General exam: Sleeping comfortably but responds to voice briefly, does not open eyes, no acute distress HEENT: Keeps eyes closed, moist mucus membranes, hearing grossly normal  Respiratory system: CTAB, no wheezes, rales or rhonchi, normal respiratory effort. Cardiovascular system: normal S1/S2, bradycardic, no pedal edema.   Gastrointestinal system: soft, nontender abdomen Central nervous system: Exam limited due to patient's mental status and underlying dementia, nonverbal during encounter, follows commands (wiggles toes, squeezes fingers -these are symmetric) Extremities: no edema, normal tone Skin: dry, intact, normal temperature Psychiatry: Unable to assess due to  somnolence and altered mental status   Data Reviewed:  Notable labs: Bicarb 21, BUN 37, creatinine 1.36, GFR 52.  Urinalysis with large leukocytes, positive nitrite, many bacteria, greater than 50 WBCs, mucus present .  CBC with white count 12.1, hbg 12.2 with macrocytosis.  Assessment and Plan: * Stroke-like symptoms Presented with altered mental status, not acting usual self, SNF staff or caregiver felt speech was slurred.  Had acute CVA one month ago. CT head negative. --Neurology following --MRI brain pending. --PT/OT/SLP evaluations --Full dose ASA daily --Continue Lipitor --Resume Eliquis after MRI and okay from neurology standpoint  UTI (urinary tract infection) Presented with altered mental status and UA consistent with infection.  Mild leukocytosis but no other SIRS criteria, patient not septic. --Continue empiric Rocephin. --Follow urine cultures.  AF (paroxysmal atrial fibrillation) (HCC) HR controlled and intermittently bradycardic.   --Telemetry --Eliquis on hold pending MRI for stroke evaluation --Appears not on rate control agent likely  due to bradycardia  Seizures (HCC) Continue Keppra IV for now until safely taking PO  HTN (hypertension) Hold any antihypertensives for permissive HTN until stroke ruled out.  Dementia (HCC) Baseline dementia appears fairly advanced of the baseline unclear in this acute setting.  No family or caregivers were at bedside on admission encounter. -- Delirium precautions  History of stroke Patient diagnosed with acute CVA about a month ago on June 3, admitted to [facility].  He had dysarthria which had apparently resolved. -- Neurology following -- Otherwise management as outlined in strokelike symptoms -- Resume on Eliquis once ok w neurology -- Discharged to SNF/rehab last admission  Acute metabolic encephalopathy Superimposed on baseline dementia. Presented as code stroke but initial stroke eval negative.  Found to have UTI.  Patient is lethargic and minimally responsive. --Follow-up pending MRI --Treat UTI as outlined -- Delirium precautions -- Stroke work-up as outlined and per neurology --NPO for now, substitute IV meds where possible  HLD (hyperlipidemia) Continue Lipitor      Advance Care Planning:   Code Status: DNR  CODE STATUS Per chart review from last admission and palliative care encounter at that time  Consults: Neurology  Family Communication: None present at bedside during encounter.  Will attempt to call  Severity of Illness: The appropriate patient status for this patient is INPATIENT. Inpatient status is judged to be reasonable and necessary in order to provide the required intensity of service to ensure the patient's safety. The patient's presenting symptoms, physical exam findings, and initial radiographic and laboratory data in the context of their chronic comorbidities is felt to place them at high risk for further clinical deterioration. Furthermore, it is not anticipated that the patient will be medically stable for discharge from the hospital within 2 midnights of admission.   * I certify that at the point of admission it is my clinical judgment that the patient will require inpatient hospital care spanning beyond 2 midnights from the point of admission due to high intensity of service, high risk for further deterioration and high frequency of surveillance required.*  Author: Pennie Banter, DO 05/27/2022 4:15 PM  For on call review [email or web address].

## 2022-05-27 NOTE — Assessment & Plan Note (Signed)
Patient diagnosed with acute CVA about a month ago on June 3, admitted to [facility].  He had dysarthria which had apparently resolved. -- Neurology following -- Otherwise management as outlined in strokelike symptoms -- Resume on Eliquis once ok w neurology -- Discharged to SNF/rehab last admission

## 2022-05-28 DIAGNOSIS — I69391 Dysphagia following cerebral infarction: Secondary | ICD-10-CM

## 2022-05-28 DIAGNOSIS — R531 Weakness: Secondary | ICD-10-CM | POA: Diagnosis not present

## 2022-05-28 DIAGNOSIS — N390 Urinary tract infection, site not specified: Secondary | ICD-10-CM | POA: Diagnosis not present

## 2022-05-28 DIAGNOSIS — E43 Unspecified severe protein-calorie malnutrition: Secondary | ICD-10-CM | POA: Insufficient documentation

## 2022-05-28 DIAGNOSIS — R1310 Dysphagia, unspecified: Secondary | ICD-10-CM | POA: Diagnosis not present

## 2022-05-28 DIAGNOSIS — Z515 Encounter for palliative care: Secondary | ICD-10-CM

## 2022-05-28 DIAGNOSIS — R2990 Unspecified symptoms and signs involving the nervous system: Secondary | ICD-10-CM | POA: Diagnosis not present

## 2022-05-28 LAB — BASIC METABOLIC PANEL
Anion gap: 5 (ref 5–15)
BUN: 25 mg/dL — ABNORMAL HIGH (ref 8–23)
CO2: 22 mmol/L (ref 22–32)
Calcium: 10 mg/dL (ref 8.9–10.3)
Chloride: 114 mmol/L — ABNORMAL HIGH (ref 98–111)
Creatinine, Ser: 0.92 mg/dL (ref 0.61–1.24)
GFR, Estimated: >60 mL/min (ref >60)
Glucose, Bld: 85 mg/dL (ref 70–99)
Potassium: 3.7 mmol/L (ref 3.5–5.1)
Sodium: 141 mmol/L (ref 135–145)

## 2022-05-28 LAB — CBC
HCT: 34.8 % — ABNORMAL LOW (ref 39.0–52.0)
Hemoglobin: 11.8 g/dL — ABNORMAL LOW (ref 13.0–17.0)
MCH: 34.3 pg — ABNORMAL HIGH (ref 26.0–34.0)
MCHC: 33.9 g/dL (ref 30.0–36.0)
MCV: 101.2 fL — ABNORMAL HIGH (ref 80.0–100.0)
Platelets: 219 10*3/uL (ref 150–400)
RBC: 3.44 MIL/uL — ABNORMAL LOW (ref 4.22–5.81)
RDW: 15.4 % (ref 11.5–15.5)
WBC: 11.9 10*3/uL — ABNORMAL HIGH (ref 4.0–10.5)
nRBC: 0 % (ref 0.0–0.2)

## 2022-05-28 LAB — MAGNESIUM: Magnesium: 1.6 mg/dL — ABNORMAL LOW (ref 1.7–2.4)

## 2022-05-28 MED ORDER — ADULT MULTIVITAMIN W/MINERALS CH
1.0000 | ORAL_TABLET | Freq: Every day | ORAL | Status: DC
  Administered 2022-05-29: 1 via ORAL
  Filled 2022-05-28: qty 1

## 2022-05-28 MED ORDER — NEPRO/CARBSTEADY PO LIQD
237.0000 mL | Freq: Three times a day (TID) | ORAL | Status: DC
  Administered 2022-05-28: 237 mL via ORAL

## 2022-05-28 MED ORDER — MAGNESIUM SULFATE 2 GM/50ML IV SOLN
2.0000 g | Freq: Once | INTRAVENOUS | Status: AC
  Administered 2022-05-28: 2 g via INTRAVENOUS
  Filled 2022-05-28: qty 50

## 2022-05-28 MED ORDER — BIOTENE DRY MOUTH MT LIQD: 15.0000 mL | OROMUCOSAL | Status: DC | PRN

## 2022-05-28 MED ORDER — LEVETIRACETAM 500 MG PO TABS
500.0000 mg | ORAL_TABLET | Freq: Two times a day (BID) | ORAL | Status: DC
  Administered 2022-05-28 – 2022-05-29 (×2): 500 mg via ORAL
  Filled 2022-05-28 (×2): qty 1

## 2022-05-28 MED ORDER — APIXABAN 5 MG PO TABS
5.0000 mg | ORAL_TABLET | Freq: Two times a day (BID) | ORAL | Status: DC
  Administered 2022-05-28 – 2022-05-29 (×2): 5 mg via ORAL
  Filled 2022-05-28 (×2): qty 1

## 2022-05-28 MED ORDER — METOPROLOL SUCCINATE ER 25 MG PO TB24
12.5000 mg | ORAL_TABLET | Freq: Every day | ORAL | Status: DC
  Administered 2022-05-28 – 2022-05-29 (×2): 12.5 mg via ORAL
  Filled 2022-05-28 (×2): qty 1

## 2022-05-28 MED ORDER — AMLODIPINE BESYLATE 5 MG PO TABS
5.0000 mg | ORAL_TABLET | Freq: Every day | ORAL | Status: DC
  Administered 2022-05-28 – 2022-05-29 (×2): 5 mg via ORAL
  Filled 2022-05-28 (×2): qty 1

## 2022-05-28 MED ORDER — ATORVASTATIN CALCIUM 20 MG PO TABS
80.0000 mg | ORAL_TABLET | Freq: Every day | ORAL | Status: DC
  Administered 2022-05-28 – 2022-05-29 (×2): 80 mg via ORAL
  Filled 2022-05-28 (×2): qty 4

## 2022-05-28 NOTE — Hospital Course (Signed)
81 y.o. male with medical history significant of dementia, history of stroke with left-sided weakness, recent stroke last month with dysarthria which had reportedly resolved, paroxysmal A-fib, severe left ICA stenosis, seizure disorder on Keppra, hypertension, hyperlipidemia, GERD, history of alcohol and tobacco abuse presented from his facility today with altered mental status and concern for strokelike symptoms, lethargy and generalized weakness.   Seen by neurology.  Stroke evaluation was negative.  Patient had an acute stroke a month ago, admitted here and discharge summary indicates patient developed GI bleeding when tried on Eliquis.  Appears patient has remained off Eliquis since then, was discharged on aspirin.  Neurology this admission recommending resumption of anticoagulation.    ED evaluation did reveal a UTI.  Patient started on empiric Rocephin pending culture results.    Mental status significantly improved since admission.

## 2022-05-28 NOTE — Assessment & Plan Note (Signed)
Seen by SLP and resumed on dysphagia level 2 (pured) diet with nectar thickened liquids.  Aspiration precautions.  Assistance with meals and all p.o. intake.  Whole meds in pure.

## 2022-05-28 NOTE — Evaluation (Signed)
Occupational Therapy Evaluation Patient Details Name: Richard Carter MRN: [id number] DOB: Oct 30, 1941 Today's Date: 05/28/2022   History of Present Illness Pt is a 81 y.o. male with a history of CVA, paroxysmal atrial fibrillation, seizure disorder, hypertension, alcohol abuse and dementia who presents to ED 05/27/22 with generalized weakness and altered mental status as well as possible facial droop. MD assessment includes Urinary tract infection without hematuria and  Altered mental status   Clinical Impression   Pt seen for OT evaluation this date.  Pt lethargic but cooperative and with good participation.  Pt required min A for bed mobility and min A sit to stand from elevated bed, multiple rocking attempts to achieve full stance. Once up, pt tolerated functional mobility around bed to reach recliner on the other side, but required max A to maneuver walker around turns and while side stepping initially.  Food tray present and pt required hand over hand to initiate spoon to mouth.  Dyskinesia and poor proprioception limited indep with self feeding.  OT notified NT pt requires assist for drinking/meals.  OT provided hand over hand for cup to mouth, assist to place straw in mouth and pt sipped a thickened juice 90% gone.  Left pt sitting up in recliner with chair alarm on and NT present taking VS.  Encouraged 2 person transfer back to bed d/t inconsistency with following 1 step commands, balance impairment, and need for assist to manage walker and IV pole.  Pt will benefit from skilled OT in the acute setting to maximize safety and indep with ADLs and functional mobility, working to reduce burden of care on caregivers.  Would benefit from SNF upon d/c.        Recommendations for follow up therapy are one component of a multi-disciplinary discharge planning process, led by the attending physician.  Recommendations may be updated based on patient status, additional functional criteria and insurance  authorization.   Follow Up Recommendations  Skilled nursing-short term rehab (<3 hours/day)    Assistance Recommended at Discharge Frequent or constant Supervision/Assistance  Patient can return home with the following Assistance with cooking/housework;Direct supervision/assist for financial management;A lot of help with walking and/or transfers;A lot of help with bathing/dressing/bathroom;Assistance with feeding;Direct supervision/assist for medications management;Help with stairs or ramp for entrance    Functional Status Assessment  Patient has had a recent decline in their functional status and demonstrates the ability to make significant improvements in function in a reasonable and predictable amount of time.  Equipment Recommendations  None recommended by OT    Recommendations for Other Services       Precautions / Restrictions Precautions Precautions: Fall Restrictions Weight Bearing Restrictions: No      Mobility Bed Mobility Overal bed mobility: Needs Assistance Bed Mobility: Supine to Sit     Supine to sit: Min guard, HOB elevated       Patient Response: Flat affect, Cooperative  Transfers Overall transfer level: Needs assistance Equipment used: Rolling walker (2 wheels) Transfers: Sit to/from Stand Sit to Stand: Min guard, From elevated surface           General transfer comment: min A with multiple rocking attempts with cues for hand and foot placement.      Balance Overall balance assessment: Needs assistance Sitting-balance support: Bilateral upper extremity supported, Feet unsupported Sitting balance-Leahy Scale: Fair Sitting balance - Comments: Maintained BUE support on EOB for OT to assist with donning socks   Standing balance support: Bilateral upper extremity supported, During functional activity, Reliant  on assistive device for balance Standing balance-Leahy Scale: Poor                             ADL either performed or  assessed with clinical judgement   ADL Overall ADL's : Needs assistance/impaired Eating/Feeding: Maximal assistance Eating/Feeding Details (indicate cue type and reason): attempted hand over hand to initiate spoon to mouth; pt brings food 50% of the way before dyskinesia impairs self feeding.  Arms jerk and pt can not sustain holding cup or spoon and drops food.                 Lower Body Dressing: Maximal assistance;Sitting/lateral leans Lower Body Dressing Details (indicate cue type and reason): No attempt to initiate donning socks with vc and tactile cues from therapist to attempt to reach to feet when given socks in hand.             Functional mobility during ADLs: Minimal assistance;Rolling walker (2 wheels) General ADL Comments: min A to maintain balance to amb from 1 side of the bed to the other to reach recliner.  Pt required Max A to maneuver walker while making turns and side stepping.     Vision   Additional Comments: unable to accurately assess d/t decreased cognition                Pertinent Vitals/Pain Pain Assessment Pain Assessment: No/denies pain Breathing: normal Negative Vocalization: none Facial Expression: smiling or inexpressive Body Language: relaxed Consolability: no need to console PAINAD Score: 0     Hand Dominance Right   Extremity/Trunk Assessment Upper Extremity Assessment Upper Extremity Assessment: Generalized weakness;RUE deficits/detail RUE Deficits / Details: active range to ~70*shoulder flexion   Lower Extremity Assessment Lower Extremity Assessment: Generalized weakness       Communication Communication Communication: Other (comment);Receptive difficulties;Expressive difficulties   Cognition Arousal/Alertness: Lethargic Behavior During Therapy: Flat affect Overall Cognitive Status: Impaired/Different from baseline Area of Impairment: Orientation, Following commands, Attention                 Orientation Level:  Disoriented to, Situation     Following Commands: Follows one step commands inconsistently       General Comments: Oriented to person and place; living situation per chart as no caregiver in room.                      Home Living Family/patient expects to be discharged to:: Private residence   Available Help at Discharge: Available 24 hours/day;Personal care attendant Type of Home: Mobile home Home Access: Stairs to enter Entrance Stairs-Number of Steps: 2 Entrance Stairs-Rails: Can reach both Home Layout: One level     Bathroom Shower/Tub: Chief Strategy Officer: Standard Bathroom Accessibility: Yes   Home Equipment: Grab bars - tub/shower;Shower seat;Grab bars - toilet;Rolling Walker (2 wheels);BSC/3in1          Prior Functioning/Environment Prior Level of Function : Needs assist             Mobility Comments: Ind household ambulator using RW; 1 hx of falls ADLs Comments: Caregiver assist for ADLs        OT Problem List: Decreased strength;Decreased coordination;Decreased activity tolerance;Decreased cognition;Impaired balance (sitting and/or standing);Decreased safety awareness      OT Treatment/Interventions: Self-care/ADL training;Therapeutic exercise;Patient/family education;Balance training;Therapeutic activities;DME and/or AE instruction    OT Goals(Current goals can be found in the care plan section) Acute Rehab  OT Goals Patient Stated Goal: Unable to state goal OT Goal Formulation: With patient Time For Goal Achievement: 06/11/22 Potential to Achieve Goals: Fair  OT Frequency: Min 2X/week                  AM-PAC OT "6 Clicks" Daily Activity     Outcome Measure Help from another person eating meals?: A Lot Help from another person taking care of personal grooming?: A Lot Help from another person toileting, which includes using toliet, bedpan, or urinal?: Total Help from another person bathing (including washing, rinsing,  drying)?: Total Help from another person to put on and taking off regular upper body clothing?: A Lot Help from another person to put on and taking off regular lower body clothing?: A Lot 6 Click Score: 10   End of Session Equipment Utilized During Treatment: Gait belt;Rolling walker (2 wheels) Nurse Communication: Mobility status  Activity Tolerance: Patient tolerated treatment well;No increased pain;Patient limited by lethargy Patient left: in chair;with call bell/phone within reach;with chair alarm set;with nursing/sitter in room  OT Visit Diagnosis: Unsteadiness on feet (R26.81);Muscle weakness (generalized) (M62.81)                Time: 6213-0865 OT Time Calculation (min): 23 min Charges:  OT General Charges $OT Visit: 1 Visit OT Evaluation $OT Eval Moderate Complexity: 1 Mod OT Treatments $Self Care/Home Management : 8-22 mins  Danelle Earthly, MS, OTR/L   Otis Dials 05/28/2022, 12:29 PM

## 2022-05-28 NOTE — Assessment & Plan Note (Signed)
Related to chronic illnesses, prior strokes, alcohol abuse, dementia, as evidenced by severe fat and muscle depletion. Body mass index is 21.63 kg/m. -- Appreciate dietitian recommendations -- Started on Nepro shakes, Magic cups, multivitamin -- Assist with feeding at mealtimes -- High risk for refeeding syndrome, monitor K, Mg, Phos

## 2022-05-28 NOTE — TOC Initial Note (Addendum)
Transition of Care [facility]) - Initial/Assessment Note    Patient Details  Name: Richard Carter MRN: [id number] Date of Birth: 08/18/1941  Transition of [facility]) CM/SW Contact:    Maree Krabbe, LCSW Phone Number: 05/28/2022, 3:46 PM  Clinical Narrative:     Pt's Son Roe Coombs in [place] and request and pt's Caregiver [name] be contacted. CSW spoke with [name]. [name] states pt is out of rehab days as he was recently at [facility] for 20 days. Pt is active with [facility] and confirms plan will be for pt to return back with [name] with [facility]. CSW to reach out to [facility].             Pt is active with RN and PT with [facility]. Will need orders at dc.  Expected Discharge Plan: Home w Home Health Services Barriers to Discharge: Continued Medical Work up   Patient Goals and CMS Choice Patient states their goals for this hospitalization and ongoing recovery are:: to get better   Choice offered to / list presented to :  (caregiver)  Expected Discharge Plan and Services Expected Discharge Plan: Home w Home Health Services In-house Referral: NA   Post Acute Care Choice: Home Health Living arrangements for the past 2 months: Single Family Home                             HH Agency: [facility] Date [facility] Agency Contacted: 05/28/22 Time HH Agency Contacted: [phone]    Prior Living Arrangements/Services Living arrangements for the past 2 months: Single Family Home Lives with:: Other (Comment) (caregiver) Patient language and need for interpreter reviewed:: Yes Do you feel safe going back to the place where you live?: Yes      Need for Family Participation in Patient Care: Yes (Comment) Care giver support system in place?: Yes (comment)   Criminal Activity/Legal Involvement Pertinent to Current Situation/Hospitalization: No - Comment as needed  Activities of Daily Living Home Assistive Devices/Equipment: None ADL Screening (condition at time of admission) Patient's cognitive  ability adequate to safely complete daily activities?: No Is the patient deaf or have difficulty hearing?: Yes Does the patient have difficulty seeing, even when wearing glasses/contacts?: No Does the patient have difficulty concentrating, remembering, or making decisions?: Yes Patient able to express need for assistance with ADLs?: No Does the patient have difficulty dressing or bathing?: Yes Independently performs ADLs?: No Communication: Needs assistance Is this a change from baseline?: Pre-admission baseline Dressing (OT): Needs assistance Is this a change from baseline?: Pre-admission baseline Grooming: Needs assistance Is this a change from baseline?: Pre-admission baseline Feeding: Needs assistance Is this a change from baseline?: Pre-admission baseline Bathing: Needs assistance Is this a change from baseline?: Pre-admission baseline Toileting: Needs assistance Is this a change from baseline?: Pre-admission baseline In/Out Bed: Needs assistance Is this a change from baseline?: Pre-admission baseline Walks in Home: Needs assistance Is this a change from baseline?: Pre-admission baseline Does the patient have difficulty walking or climbing stairs?: Yes Weakness of Legs: Both Weakness of Arms/Hands: Both  Permission Sought/Granted Permission sought to share information with : Family Supports Permission granted to share information with : Yes, Release of Information Signed, Yes, Verbal Permission Granted  Share Information with NAME: [name]     Permission granted to share info w Relationship: Caregiver     Emotional Assessment Appearance:: Appears stated age Attitude/Demeanor/Rapport: Unable to Assess Affect (typically observed): Unable to Assess Orientation: : Oriented to  Self Alcohol / Substance Use: Not Applicable Psych Involvement: No (comment)  Admission diagnosis:  Stroke-like symptoms [R29.90] Urinary tract infection without hematuria, site unspecified  [N39.0] Altered mental status, unspecified altered mental status type [R41.82] Patient Active Problem List   Diagnosis Date Noted   Protein-calorie malnutrition, severe 05/28/2022   Dysphagia 05/28/2022   Stroke-like symptoms 05/27/2022   Acute metabolic encephalopathy 05/27/2022   History of stroke 05/27/2022   Aspiration pneumonia (HCC) 04/23/2022   Nicotine dependence    Elevated troponin    Acute CVA (cerebrovascular accident) (HCC) 05/07/2021   Non compliance w medication regimen 05/07/2021   Dementia (HCC) 05/07/2021   AF (paroxysmal atrial fibrillation) (HCC) 05/07/2021   HTN (hypertension) 11/29/2020   Alcohol abuse 11/29/2020   AKI (acute kidney injury) (HCC) 11/29/2020   Atrial flutter with rapid ventricular response (HCC) 11/29/2020   Seizures (HCC) 02/22/2020   Pubic ramus fracture (HCC) 07/29/2019   Acetabular fracture (HCC) 07/29/2019   HLD (hyperlipidemia) 07/29/2019   GERD (gastroesophageal reflux disease) 07/29/2019   UTI (urinary tract infection) 07/29/2019   PCP:  Marisue Ivan, MD Pharmacy:   [facility] [address] ([address]),  - [address] Jer[place]nn M[place]) [place] [address] Phone: [phone] Fax: [phone]     Social Determinants of Health (SDOH) Interventions    Readmission Risk Interventions    04/21/2022    1:55 PM  Readmission Risk Prevention Plan  Post Dischage Appt Complete  Medication Screening Complete  Transportation Screening Complete

## 2022-05-28 NOTE — Progress Notes (Signed)
Initial Nutrition Assessment  DOCUMENTATION CODES:   Severe malnutrition in context of chronic illness  INTERVENTION:   Nepro Shake po TID, each supplement provides 425 kcal and 19 grams protein  Magic cup TID with meals, each supplement provides 290 kcal and 9 grams of protein  MVI po daily   Assist with meals   Pt at high refeed risk; recommend monitor potassium, magnesium and phosphorus labs daily until stable  NUTRITION DIAGNOSIS:   Severe Malnutrition related to chronic illness (CVA, etoh abuse, dementia) as evidenced by severe fat depletion, severe muscle depletion.  GOAL:   Patient will meet greater than or equal to 90% of their needs  MONITOR:   PO intake, Supplement acceptance, Labs, Weight trends, Skin, I & O's  REASON FOR ASSESSMENT:   Malnutrition Screening Tool    ASSESSMENT:   81 y/o male with h/o Afib, etoh abuse, GERD, seizures, HTN, dementia and recent UTI and CVA who is admitted with generalized weakness, altered mental status and possible facial droop.  Met with pt in room today. Pt is unable to provide nutrition related history r/t dementia but only repeats that he wants water. Pt seen by SLP today and placed on a dysphagia 1/nectar thick diet. Pt is documented to be eating 50-75% of meals in hospital. Pt's lunch tray is sitting on his side table and pt is awaiting assistance from nursing before eating. Per chart, pt is down 10lbs(7%) over the past month; this is significant weight loss. RD suspects pt with decreased oral intake since having his stroke in June. Pt reports that he does like strawberry Ensure; RD will add Nepro as pt is on nectar thick liquids. RD will also place an order for meal assistance. Pt is likely at refeed risk.   Medications reviewed and include: lovenox, nicotine, senokot, NaCl '@50ml' /hr, ceftriaxone   Labs reviewed: K 3.7 wnl, Mg 1.6(L) Wbc- 11.9(H)  NUTRITION - FOCUSED PHYSICAL EXAM:  Flowsheet Row Most Recent Value   Orbital Region Moderate depletion  Upper Arm Region Severe depletion  Thoracic and Lumbar Region Severe depletion  Buccal Region Moderate depletion  Temple Region Moderate depletion  Clavicle Bone Region Severe depletion  Clavicle and Acromion Bone Region Severe depletion  Scapular Bone Region Severe depletion  Dorsal Hand Severe depletion  Patellar Region Severe depletion  Anterior Thigh Region Severe depletion  Posterior Calf Region Severe depletion  Edema (RD Assessment) None  Hair Reviewed  Eyes Reviewed  Mouth Reviewed  Skin Reviewed  Nails Reviewed   Diet Order:   Diet Order             DIET - DYS 1 Room service appropriate? No; Fluid consistency: Nectar Thick  Diet effective now                  EDUCATION NEEDS:   Not appropriate for education at this time  Skin:  Skin Assessment: Reviewed RN Assessment (ecchymosis)  Last BM:  pta  Height:   Ht Readings from Last 1 Encounters:  05/27/22 '5\' 5"'  (1.651 m)    Weight:   Wt Readings from Last 1 Encounters:  05/27/22 59 kg    Ideal Body Weight:  61.8 kg  BMI:  Body mass index is 21.63 kg/m.  Estimated Nutritional Needs:   Kcal:  1600-1800kcal/day  Protein:  80-90g/day  Fluid:  1.5-1.7L/day  Koleen Distance MS, RD, LDN Please refer to [facility] for RD and/or RD on-call/weekend/after hours pager

## 2022-05-28 NOTE — Evaluation (Signed)
Clinical/Bedside Swallow Evaluation Patient Details  Name: Richard Carter MRN: [id number] Date of Birth: [birth year]  Today's Date: 05/28/2022 Time: SLP Start Time (ACUTE ONLY): 0805 SLP Stop Time (ACUTE ONLY): 0900 SLP Time Calculation (min) (ACUTE ONLY): 55 min  Past Medical History:  Past Medical History:  Diagnosis Date   Alcohol abuse    Dementia (HCC)    GERD (gastroesophageal reflux disease)    HLD (hyperlipidemia)    HTN (hypertension)    Seizures (HCC)    Stroke [facility])    Past Surgical History:  Past Surgical History:  Procedure Laterality Date   skin cyst removal     HPI:  Pt is a 81 y.o. male with a history of CVA, paroxysmal atrial fibrillation, seizure disorder, hypertension, Alcohol Abuse and advanced Dementia who presents with generalized weakness and altered mental status as well as possible facial droop first noted when the patient awoke this morning.  He was last seen at his baseline yesterday at 9 PM.  The patient himself is unable to give any history.  He is currently being treated with antibiotics for UTI and got his last dose yesterday per notes.  Pt was recently admitted 03/2022 presenting w/ lethargy and slurred speech, daily ETOH use.  MRI 04/20/2022 admit: Acute infarcts in the posterior left frontal cortex and subcortical  white matter, medial right parietal cortex and subcortical white  matter, and right frontal white matter, with additional small  subacute infarcts in the bilateral cerebellar hemispheres. No  hemorrhage, mass effect, or midline shift.  MRI this admit 05/27/2922: Expected evolution of infarcts on the prior study. No acute infarct.  Otherwise no significant change since recent prior study.    Assessment / Plan / Recommendation  Clinical Impression   Pt appears to present w/ oropharyngeal phase Dysphagia in setting of Baseline Dementia -- "Moderate Atrophy and extensive chronic small vessel ischemic changes of the white matter", as well as impact from  recent CVA(see notes from last admit 03/2022), ETOH abuse, and Dysphagia. MRI revealed "expected evolution from previous infarcts: right and bilateral frontal lobe, right posterior temporal lobe infarcts, per MRI this admit. Pt was discharged on a dysphagia diet 04/2022 to SNF.   Cognitive decline can impact overall awareness/timing of swallow and safety during po tasks which increases risk for aspiration, choking. Pt's risk for aspiration can be reduced when following general aspiration precautions, given feeding support/Supervision, and when using a modified diet consistency of puree foods w/ Nectar consistency liquids. Pt also appears Edentulous.   At this BSE, he awakened easily and turned himself over in bed; intermittent (verbal) response but not consistent. Needed hand over hand for support w/ holding cup to self-feed. Oral care delivered w/ pt exhibiting some oral confusion and sucking on swab stick.  He required Mod verbal/visual/tactile cues for follow through during po tasks.      Pt consumed several trials of purees, and Nectar liquids via straw. NO overt clinical s/s of aspiration noted w/ trials given: no decline in vocal quality; no cough, and no decline in respiratory status during/post trials. Swallowing appeared organized and timely. O2 sats remained 97%. Oral phase was adequate for bolus management and oral clearing of the boluses given. Lingual smacking noted. Pt did not fully attempt self-feeding; decreased awareness of task and poor follow through noted as well as weakness in bilat. UEs.  OM Exam was cursory; pt could not fully participate and chewed on the swabs/tongue blade, however, No unilateral weakness noted during bolus acceptance  and oral clearing was adequate(lingual sweeping); no anterior leakage noted(labial closure adequate).         In setting of baseline Dementia and Cognitive decline, Edentulous status, recent CVA, and risk for aspiration, recommend the previously  recommended diet of dysphagia level 1(Puree foods) moistened w/ Nectar consistency liquids; aspiration precautions; reduce Distractions during meals and engage pt during meals for self-feeding. Pills Crushed in Puree for safer swallowing. Support w/ feeding at meals. REFLUX precautions. MD/NSG updated.  ST services recommends follow w/ Palliative Care for GOC and education re: impact of Cognitive decline/Dementia on swallowing, oral intake in general. Dietician f/u.  Pt recommended to be followed post Discharge and return to SNF for ongoing dysphagia therapy for assessment of any diet consistency upgrade. Precautions posted in room. MD/NSG updated, agreed.  SLP Visit Diagnosis: Dysphagia, oropharyngeal phase (R13.12) (baseline Dementia; dysphagia previous admit)    Aspiration Risk  Mild aspiration risk;Risk for inadequate nutrition/hydration    Diet Recommendation  Dysphagia level 1(Puree foods) moistened w/ Nectar consistency liquids; aspiration precautions; reduce Distractions during meals and engage pt during meals for self-feeding. Feeding support at meals.   Medication Administration: Whole meds with puree (vs Crushed in puree)    Other  Recommendations Recommended Consults:  (Dietician f/u; Palliative Care f/u) Oral Care Recommendations: Oral care BID;Oral care before and after PO;Staff/trained caregiver to provide oral care Other Recommendations: Order thickener from pharmacy;Prohibited food (jello, ice cream, thin soups);Remove water pitcher;Have oral suction available    Recommendations for follow up therapy are one component of a multi-disciplinary discharge planning process, led by the attending physician.  Recommendations may be updated based on patient status, additional functional criteria and insurance authorization.  Follow up Recommendations Skilled nursing-short term rehab (<3 hours/day)      Assistance Recommended at Discharge Frequent or constant Supervision/Assistance   Functional Status Assessment Patient has had a recent decline in their functional status and/or demonstrates limited ability to make significant improvements in function in a reasonable and predictable amount of time  Frequency and Duration min 2x/week  2 weeks       Prognosis Prognosis for Safe Diet Advancement: Guarded (-Fair) Barriers to Reach Goals: Cognitive deficits;Language deficits;Time post onset;Severity of deficits Barriers/Prognosis Comment: baseline Dementia, ETOH abuse, CVA, Dysphagia.      Swallow Study   General Date of Onset: 05/27/22 HPI: Pt is a 81 y.o. male with a history of CVA, paroxysmal atrial fibrillation, seizure disorder, hypertension, Alcohol Abuse and advanced Dementia who presents with generalized weakness and altered mental status as well as possible facial droop first noted when the patient awoke this morning.  He was last seen at his baseline yesterday at 9 PM.  The patient himself is unable to give any history.  He is currently being treated with antibiotics for UTI and got his last dose yesterday per notes.  Pt was recently admitted 03/2022 presenting w/ lethargy and slurred speech, daily ETOH use.  MRI 04/20/2022 admit: Acute infarcts in the posterior left frontal cortex and subcortical  white matter, medial right parietal cortex and subcortical white  matter, and right frontal white matter, with additional small  subacute infarcts in the bilateral cerebellar hemispheres. No  hemorrhage, mass effect, or midline shift.  MRI this admit 05/27/2922: Expected evolution of infarcts on the prior study. No acute infarct.  Otherwise no significant change since recent prior study. Type of Study: Bedside Swallow Evaluation Previous Swallow Assessment: 5-04/2022; 04/2021 Diet Prior to this Study: Dysphagia 1 (puree);Nectar-thick liquids Temperature Spikes Noted:  No (wbc 11.9 declining) Respiratory Status: Room air History of Recent Intubation: No Behavior/Cognition:  Alert;Cooperative;Pleasant mood;Confused;Distractible;Requires cueing (baseline Dementia) Oral Cavity Assessment: Dry Oral Care Completed by SLP: Yes Oral Cavity - Dentition: Edentulous Vision: Functional for self-feeding Self-Feeding Abilities: Total assist (he attempted to hold cup but weak UEs) Patient Positioning: Upright in bed (needed full positioning support) Baseline Vocal Quality: Low vocal intensity (mumbled speech x2; low intensity) Volitional Cough: Cognitively unable to elicit Volitional Swallow: Unable to elicit    Oral/Motor/Sensory Function Overall Oral Motor/Sensory Function:  (no gross, overt unilateral weakness noted during bolus management and oral clearing)   Ice Chips Ice chips: Not tested   Thin Liquid Thin Liquid: Not tested    Nectar Thick Nectar Thick Liquid: Within functional limits Presentation: Cup;Spoon;Straw (supported; ~8 ozs total)   Honey Thick Honey Thick Liquid: Not tested   Puree Puree: Within functional limits Presentation: Spoon (fed; ~4 ozs)   Solid     Solid: Not tested        Jerilynn Som, MS, CCC-SLP Speech Language Pathologist Rehab Services; [facility] [phone] (ascom)  Danicka Hourihan 05/28/2022,12:40 PM

## 2022-05-28 NOTE — Plan of Care (Signed)
MRI neg for new CVA. Stop aspirin. He can resume eliquis 5mg  BID which was recommended on prior hospitalization neuro note but not started for unclear reasons.

## 2022-05-28 NOTE — Consult Note (Signed)
Consultation Note Date: 05/28/2022   Patient Name: Richard Carter  DOB: 1941/07/12  MRN: [id number]  Age / Sex: 81 y.o., male  PCP: Dion Body, MD Referring Physician: Ezekiel Slocumb, DO  Reason for Consultation: Establishing goals of care and Psychosocial/spiritual support  HPI/Patient Profile: 81 y.o. male   admitted on 05/27/2022 with past medical history significant of dementia, recent stroke last month, paroxysmal A-fib seizure disorder on Keppra, hypertension, hyperlipidemia, GERD, history of alcohol and tobacco abuse presented from his facility today with altered mental status and concern for strokelike symptoms and generalized weakness.    Patient had a recent rehab for 21 days before returning home.  Patient lives at home with basically a 24-hour caregiver and family who live nearby.  Patient is total care for all ADLs secondary significant dementia.     Noncontrast head CT with no evidence of acute intracranial abnormalities, moderate cerebral atrophy prior infarcts as outlined in the full report chronic small vessel disease.  CTA is minimal to for large vessel occlusion.     In the ED, vitals overall stable, intermittently bradycardic with heart rate in the 50s, BP controlled 122/97 later 150/74, afebrile, without hypoxia.  Labs notable for bicarb 21, BUN 37, creatinine 1.36 up from 0.99 On on 04/27/2022.  CBC with mild leukocytosis 12.1, hemoglobin 12.2 urinalysis was grossly positive for infection with large leukocytes, positive nitrite, many bacteria and greater than 50 WBCs.  Started on IV Rocephin with urine culture sent.  Alcohol level was negative.  Patient was seen by palliative medicine as recent as 04/24/2022    Family face ongoing treatment option decision, ends advanced directive decisions and anticipatory care needs.   Clinical Assessment and Goals of Care:  This NP Wadie Lessen  reviewed medical records, received report from team, assessed the patient and then meet at the patient's bedside along with his brother/Jeff Alldredge, and caregiver/[name] Drema Dallas to discuss diagnosis, prognosis, GOC, EOL wishes disposition and options.  I also spoke to patient's sister Kathie Rhodes  by phone to discuss the above topics.  Patient does not have medical decision-making capacity at this time.   Concept of Palliative Care was introduced as specialized medical care for people and their families living with serious illness.  If focuses on providing relief from the symptoms and stress of a serious illness.  The goal is to improve quality of life for both the patient and the family. Values and goals of care important to patient and family were attempted to be elicited.    A  discussion was had today regarding advanced directives.  Concepts specific to code status, artifical feeding and hydration, continued IV antibiotics and rehospitalization was had.    The difference between a aggressive medical intervention path  and a palliative comfort care path for this patient at this time was had.      Education offered on the natural trajectory of end-stage dementia specific to increasing care needs and likely overall failure to thrive resulting in decreased oral intake and increased  risk of aspiration.    Discussed with speech therapist/ Natalia Leatherwood.   Natural trajectory and expectations at EOL were discussed.  Questions and concerns addressed.  Patient  encouraged to call with questions or concerns.     PMT will continue to support holistically.          There is a healthcare power of attorney document noted from November 2021 however patient's sister tells me that that has been revoked and the patient's brother, Wheeler [employer], is now the [facility] POA.  Family tells me that Roston Grunewald is out of the country until August, they tell me that he is in [place].  I did call and leave a message,  considering the time difference, and await callback.      SUMMARY OF RECOMMENDATIONS    Code Status/Advance Care Planning: DNR   Palliative Prophylaxis:  Aspiration, Delirium Protocol, Frequent Pain Assessment, and Oral Care  Additional Recommendations (Limitations, Scope, Preferences): Avoid Hospitalization and No Artificial Feeding  Psycho-social/Spiritual:  Desire for further Chaplaincy support:no   Prognosis:  Unable to determine  Discharge Planning: Home with Home Health      Primary Diagnoses: Present on Admission:  Stroke-like symptoms  Acute metabolic encephalopathy  AF (paroxysmal atrial fibrillation) (HCC)  Dementia (HCC)  HLD (hyperlipidemia)  HTN (hypertension)  UTI (urinary tract infection)   I have reviewed the medical record, interviewed the patient and family, and examined the patient. The following aspects are pertinent.  Past Medical History:  Diagnosis Date   Alcohol abuse    Dementia (HCC)    GERD (gastroesophageal reflux disease)    HLD (hyperlipidemia)    HTN (hypertension)    Seizures (HCC)    Stroke [facility])    Social History   Socioeconomic History   Marital status: Widowed    Spouse name: Not on file   Number of children: Not on file   Years of education: Not on file   Highest education level: Not on file  Occupational History   Not on file  Tobacco Use   Smoking status: Every Day    Packs/day: 0.50    Years: 68.00    Total pack years: 34.00    Types: Cigarettes   Smokeless tobacco: Never  Vaping Use   Vaping Use: Never used  Substance and Sexual Activity   Alcohol use: Yes    Comment: Drinks about 4oz of liquor daily. Last drink was yesterday 04/19/22.   Drug use: Never   Sexual activity: Not on file  Other Topics Concern   Not on file  Social History Narrative   Not on file   Social Determinants of Health   Financial Resource Strain: Not on file  Food Insecurity: Not on file  Transportation Needs: Not on file   Physical Activity: Not on file  Stress: Not on file  Social Connections: Not on file   Family History  Problem Relation Age of Onset   Stroke Mother    Cancer Brother    Scheduled Meds:  enoxaparin (LOVENOX) injection  40 mg Subcutaneous Daily   nicotine  14 mg Transdermal Daily   senna  1 tablet Oral BID   Continuous Infusions:  sodium chloride 50 mL/hr at 05/27/22 1925   cefTRIAXone (ROCEPHIN)  IV 1 g (05/28/22 1223)   levETIRAcetam 500 mg (05/28/22 0813)   PRN Meds:.acetaminophen **OR** acetaminophen, ondansetron **OR** ondansetron (ZOFRAN) IV Medications Prior to Admission:  Prior to Admission medications   Medication Sig Start Date End Date Taking? Authorizing Provider  amLODipine (NORVASC) 5 MG tablet Take 1 tablet (5 mg total) by mouth daily. 05/12/21  Yes Sreenath, Sudheer B, MD  atorvastatin (LIPITOR) 20 MG tablet Take 20 mg by mouth daily.   Yes [provider]  ciprofloxacin (CIPRO) 500 MG tablet Take 500 mg by mouth 2 (two) times daily. 05/22/22  Yes [provider]  levETIRAcetam (KEPPRA) 500 MG tablet Take 1 tablet (500 mg total) by mouth 2 (two) times daily. 12/01/20  Yes Danford, Earl Lites, MD  losartan (COZAAR) 50 MG tablet Take 50 mg by mouth daily. 02/25/22  Yes [provider]  metoprolol succinate (TOPROL-XL) 25 MG 24 hr tablet Take 0.5 tablets (12.5 mg total) by mouth daily. 04/30/22  Yes Swayze, Ava, DO  Multiple Vitamin (MULTIVITAMIN WITH MINERALS) TABS tablet Take 1 tablet by mouth daily. 04/30/22  Yes Swayze, Ava, DO  pantoprazole (PROTONIX) 40 MG tablet Take 1 tablet (40 mg total) by mouth 2 (two) times daily. After sixty days the patient is to continue protonix at 40 mg daily. 04/29/22 06/28/22 Yes Swayze, Ava, DO  thiamine 100 MG tablet Take 1 tablet (100 mg total) by mouth daily. 04/30/22  Yes Swayze, Ava, DO  traZODone (DESYREL) 100 MG tablet Take 1 tablet (100 mg total) by mouth at bedtime. 05/11/21  Yes Sreenath, Sudheer B, MD   atorvastatin (LIPITOR) 80 MG tablet Take 1 tablet (80 mg total) by mouth daily. Patient not taking: Reported on 05/27/2022 04/30/22   Swayze, Ava, DO  food thickener (SIMPLYTHICK, NECTAR/LEVEL 2/MILDLY THICK,) GEL Take 1 packet by mouth as needed. 04/29/22   Swayze, Ava, DO  magnesium oxide (MAG-OX) 400 MG tablet Take 400 mg by mouth daily.    [provider]  nicotine (NICODERM CQ - DOSED IN MG/24 HOURS) 14 mg/24hr patch Place 1 patch (14 mg total) onto the skin daily. 04/30/22   Swayze, Ava, DO  Nutritional Supplements (FEEDING SUPPLEMENT, NEPRO CARB STEADY,) LIQD Take 237 mLs by mouth 3 (three) times daily between meals. 04/29/22   Swayze, Ava, DO  thiamine (VITAMIN B-1) 100 MG tablet Take 100 mg by mouth daily. 04/29/22   [provider]   No Known Allergies Review of Systems  Unable to perform ROS: Dementia    Physical Exam Constitutional:      Appearance: He is underweight.  Cardiovascular:     Rate and Rhythm: Normal rate.  Pulmonary:     Effort: Pulmonary effort is normal.  Skin:    General: Skin is warm and dry.  Neurological:     Mental Status: He is alert. He is disoriented.  Psychiatric:        Behavior: Behavior is slowed.        Cognition and Memory: Cognition is impaired. Memory is impaired.     Vital Signs: BP 135/84 (BP Location: Left Arm)   Pulse 81   Temp 98 F (36.7 C)   Resp 16   Ht 5\' 5"  (1.651 m)   Wt 59 kg   SpO2 98%   BMI 21.63 kg/m  Pain Scale: 0-10   Pain Score: 0-No pain   SpO2: SpO2: 98 % O2 Device:SpO2: 98 % O2 Flow Rate: .   IO: Intake/output summary:  Intake/Output Summary (Last 24 hours) at 05/28/2022 1243 Last data filed at 05/28/2022 0900 Gross per 24 hour  Intake 842.35 ml  Output 500 ml  Net 342.35 ml    LBM: Last BM Date :  (UTA) Baseline Weight: Weight: 59 kg Most recent weight: Weight:  59 kg     Palliative Assessment/Data:   Discussed with attending team and bedside RN  Signed by: Wadie Lessen, NP   Please  contact Palliative Medicine Team phone at [phone] for questions and concerns.  For individual provider: See Shea Evans

## 2022-05-28 NOTE — [place] FL2 (Signed)
[place] MEDICAID FL2 LEVEL OF CARE SCREENING TOOL     IDENTIFICATION  Patient Name: Richard Carter Birthdate: 04/14/1941 Sex: male Admission Date (Current Location): 05/27/2022  [facility] and [place] Number:  Chiropodist and Address:  [facility], [address] [place], [place] [address]      Provider Number: [id number]  Attending Physician Name and Address:  Pennie Banter, DO  Relative Name and Phone Number:       Current Level of Care: Hospital Recommended Level of Care: Skilled Nursing Facility Prior Approval Number:    Date Approved/Denied:   PASRR Number:    Discharge Plan: SNF    Current Diagnoses: Patient Active Problem List   Diagnosis Date Noted   Protein-calorie malnutrition, severe 05/28/2022   Dysphagia 05/28/2022   Stroke-like symptoms 05/27/2022   Acute metabolic encephalopathy 05/27/2022   History of stroke 05/27/2022   Aspiration pneumonia (HCC) 04/23/2022   Nicotine dependence    Elevated troponin    Acute CVA (cerebrovascular accident) (HCC) 05/07/2021   Non compliance w medication regimen 05/07/2021   Dementia (HCC) 05/07/2021   AF (paroxysmal atrial fibrillation) (HCC) 05/07/2021   HTN (hypertension) 11/29/2020   Alcohol abuse 11/29/2020   AKI (acute kidney injury) (HCC) 11/29/2020   Atrial flutter with rapid ventricular response (HCC) 11/29/2020   Seizures (HCC) 02/22/2020   Pubic ramus fracture (HCC) 07/29/2019   Acetabular fracture (HCC) 07/29/2019   HLD (hyperlipidemia) 07/29/2019   GERD (gastroesophageal reflux disease) 07/29/2019   UTI (urinary tract infection) 07/29/2019    Orientation RESPIRATION BLADDER Height & Weight     Self  Normal Incontinent Weight: 130 lb (59 kg) Height:  5\' 5"  (165.1 cm)  BEHAVIORAL SYMPTOMS/MOOD NEUROLOGICAL BOWEL NUTRITION STATUS      Incontinent Diet (DYS 1 diet, nectar thick)  AMBULATORY STATUS COMMUNICATION OF NEEDS Skin   Extensive Assist Verbally  Normal                       Personal Care Assistance Level of Assistance  Bathing, Feeding, Dressing Bathing Assistance: Maximum assistance Feeding assistance: Independent Dressing Assistance: Maximum assistance     Functional Limitations Info  Sight, Hearing, Speech Sight Info: Adequate Hearing Info: Adequate Speech Info: Adequate    SPECIAL CARE FACTORS FREQUENCY  PT (By licensed PT), OT (By licensed OT)     PT Frequency: 5x OT Frequency: 5x            Contractures Contractures Info: Not present    Additional Factors Info  Code Status, Allergies Code Status Info: DNR Allergies Info: no known allergies           Current Medications (05/28/2022):  This is the current hospital active medication list Current Facility-Administered Medications  Medication Dose Route Frequency Provider Last Rate Last Admin   0.9 %  sodium chloride infusion   Intravenous Continuous 07/29/2022 A, DO 50 mL/hr at 05/27/22 1925 New Bag at 05/27/22 1925   acetaminophen (TYLENOL) tablet 650 mg  650 mg Oral Q6H PRN 07/28/22 A, DO       Or   acetaminophen (TYLENOL) suppository 650 mg  650 mg Rectal Q6H PRN Esaw Grandchild A, DO       amLODipine (NORVASC) tablet 5 mg  5 mg Oral Daily Esaw Grandchild A, DO       apixaban (ELIQUIS) tablet 5 mg  5 mg Oral BID Esaw Grandchild A, DO       atorvastatin (LIPITOR)  tablet 80 mg  80 mg Oral Daily Esaw Grandchild A, DO       cefTRIAXone (ROCEPHIN) 1 g in sodium chloride 0.9 % 100 mL IVPB  1 g Intravenous Q24H Esaw Grandchild A, DO 200 mL/hr at 05/28/22 1223 1 g at 05/28/22 1223   feeding supplement (NEPRO CARB STEADY) liquid 237 mL  237 mL Oral TID BM Esaw Grandchild A, DO   237 mL at 05/28/22 1435   levETIRAcetam (KEPPRA) tablet 500 mg  500 mg Oral BID Esaw Grandchild A, DO       metoprolol succinate (TOPROL-XL) 24 hr tablet 12.5 mg  12.5 mg Oral Daily Esaw Grandchild A, DO       [START ON 05/29/2022] multivitamin with minerals tablet 1  tablet  1 tablet Oral Daily Esaw Grandchild A, DO       nicotine (NICODERM CQ - dosed in mg/24 hours) patch 14 mg  14 mg Transdermal Daily Esaw Grandchild A, DO   14 mg at 05/28/22 0812   ondansetron (ZOFRAN) tablet 4 mg  4 mg Oral Q6H PRN Esaw Grandchild A, DO       Or   ondansetron (ZOFRAN) injection 4 mg  4 mg Intravenous Q6H PRN Esaw Grandchild A, DO       senna (SENOKOT) tablet 8.6 mg  1 tablet Oral BID Esaw Grandchild A, DO   8.6 mg at 05/28/22 2820     Discharge Medications: Please see discharge summary for a list of discharge medications.  Relevant Imaging Results:  Relevant Lab Results:   Additional Information SS #: [id number]  Lynia Landry A Ahmani Prehn, LCSW

## 2022-05-28 NOTE — Progress Notes (Addendum)
Progress Note   Patient: Richard Carter [id number] DOB: 24-Jan-1941 DOA: 05/27/2022     1 DOS: the patient was seen and examined on 05/28/2022   Brief hospital course: 81 y.o. male with medical history significant of dementia, history of stroke with left-sided weakness, recent stroke last month with dysarthria which had reportedly resolved, paroxysmal A-fib, severe left ICA stenosis, seizure disorder on Keppra, hypertension, hyperlipidemia, GERD, history of alcohol and tobacco abuse presented from his facility today with altered mental status and concern for strokelike symptoms, lethargy and generalized weakness.   Seen by neurology.  Stroke evaluation was negative.  Patient had an acute stroke a month ago, admitted here and discharge summary indicates patient developed GI bleeding when tried on Eliquis.  Appears patient has remained off Eliquis since then, was discharged on aspirin.  Neurology this admission recommending resumption of anticoagulation.    ED evaluation did reveal a UTI.  Patient started on empiric Rocephin pending culture results.    Mental status significantly improved since admission.   Assessment and Plan: * Stroke-like symptoms Presented with altered mental status, not acting usual self, SNF staff or caregiver felt speech was slurred.  Had acute CVA one month ago.   CT head negative. MRI brain without contrast negative for any acute stroke. --Neurology consulted --PT/OT/SLP evaluations -- Resume Eliquis with close monitoring for bleeding (at time of discharge last month, patient was placed on aspirin instead of Eliquis due to bleeding).  Neurology recommended retrying Eliquis. --Continue Lipitor 80 mg   UTI (urinary tract infection) Presented with altered mental status and UA consistent with infection.  Mild leukocytosis but no other SIRS criteria, patient not septic.  Urine culture so far growing E. coli. --Continue empiric Rocephin. --Follow urine cultures for  susceptibility.  AF (paroxysmal atrial fibrillation) (HCC) HR controlled and intermittently bradycardic.   --Telemetry -- Resume Eliquis with close monitoring for bleeding --Appears not on rate control agent likely due to bradycardia  Seizures (HCC) Initially on Keppra IV, taking p.o. now.  Resume oral Keppra 500 mg twice daily.  HTN (hypertension) Antihypertensives held on admission for permissive hypertension.  Acute stroke has been ruled out. -- Resume home amlodipine, metoprolol -- Losartan held due to mild AKI, resume if indicated for BP control.  AKI resolved.  Dementia (HCC) Baseline dementia appears fairly advanced of the baseline unclear in this acute setting.  No family or caregivers were at bedside on admission encounter. -- Delirium precautions  Dysphagia Seen by SLP and resumed on dysphagia level 2 (pured) diet with nectar thickened liquids.  Aspiration precautions.  Assistance with meals and all p.o. intake.  Whole meds in pure.  Protein-calorie malnutrition, severe Related to chronic illnesses, prior strokes, alcohol abuse, dementia, as evidenced by severe fat and muscle depletion. Body mass index is 21.63 kg/m. -- Appreciate dietitian recommendations -- Started on Nepro shakes, Magic cups, multivitamin -- Assist with feeding at mealtimes -- High risk for refeeding syndrome, monitor K, Mg, Phos  History of stroke Patient diagnosed with acute CVA about a month ago on June 3, admitted to [facility].  He had dysarthria which had apparently resolved. -- Neurology consulted -- Otherwise management as outlined in strokelike symptoms -- Resume Eliquis per neurology.  Monitor closely for signs of bleeding --Continue high-dose Lipitor -- Discharged to SNF/rehab last admission  Acute metabolic encephalopathy Superimposed on baseline dementia. Presented as code stroke but stroke eval negative including MRI brain.  Found to have UTI. Patient is lethargic and minimally  responsive. --Treat  UTI as outlined -- Delirium precautions -- Stroke work-up complete and negative   HLD (hyperlipidemia) Continue Lipitor        Subjective: Patient awake sitting in recliner when seen today.  He is much more awake and alert today, no longer lethargic.  He asked for something to drink and continually smacks his lips.  Unable to elicit other specific complaints from patient due to dementia.  Physical Exam: Vitals:   05/28/22 0537 05/28/22 0841 05/28/22 1000 05/28/22 1155  BP: (!) 178/84 (!) 178/64  135/84  Pulse: 64 (!) 49 65 81  Resp: 16 18  16   Temp: 97.7 F (36.5 C) 97.6 F (36.4 C)  98 F (36.7 C)  TempSrc:      SpO2: 98% 96%  98%  Weight:      Height:       General exam: awake, alert, no acute distress, frail HEENT: moist mucus membranes, hearing grossly normal  Respiratory system: CTAB, no wheezes, rales or rhonchi, normal respiratory effort. Cardiovascular system: normal S1/S2,  RRR, no pedal edema.   Gastrointestinal system: soft, NT, ND, no HSM felt, +bowel sounds. Central nervous system: A&O x1. no gross focal neurologic deficits, normal speech Extremities: moves all, no edema, normal tone Skin: dry, intact, normal temperature Psychiatry: normal mood, congruent affect, abnormal judgement and insight due to dementia   Data Reviewed:  lNotable Labs: Chloride 114, BUN 25, AKI resolved creatinine 0.92, magnesium 1.6, WBC 11.9, hemoglobin 11.8 stable.  Urine culture preliminary growing E. coli with susceptibilities pending.  MRI brain negative for acute stroke did show multiple old strokes expected evolution of the most recent infarct.  Family Communication: Patient's son updated by phone this afternoon.  He indicates patient comes from home and cared for by a close friend Trey Paula   Disposition: Status is: Inpatient Remains inpatient appropriate because: Remains on IV antibiotics for UTI pending culture results   Planned Discharge  Destination: Home    Time spent: 35 minutes  Author: Renaldo Reel, DO 05/28/2022 3:20 PM  For on call review [email or web address].

## 2022-05-29 ENCOUNTER — Encounter: Payer: Self-pay | Admitting: Internal Medicine

## 2022-05-29 DIAGNOSIS — Z8673 Personal history of transient ischemic attack (TIA), and cerebral infarction without residual deficits: Secondary | ICD-10-CM

## 2022-05-29 DIAGNOSIS — G9341 Metabolic encephalopathy: Secondary | ICD-10-CM | POA: Diagnosis not present

## 2022-05-29 DIAGNOSIS — R2990 Unspecified symptoms and signs involving the nervous system: Secondary | ICD-10-CM | POA: Diagnosis not present

## 2022-05-29 DIAGNOSIS — N179 Acute kidney failure, unspecified: Secondary | ICD-10-CM

## 2022-05-29 DIAGNOSIS — N3001 Acute cystitis with hematuria: Secondary | ICD-10-CM | POA: Diagnosis not present

## 2022-05-29 LAB — BASIC METABOLIC PANEL
Anion gap: 3 — ABNORMAL LOW (ref 5–15)
BUN: 28 mg/dL — ABNORMAL HIGH (ref 8–23)
CO2: 22 mmol/L (ref 22–32)
Calcium: 9.1 mg/dL (ref 8.9–10.3)
Chloride: 113 mmol/L — ABNORMAL HIGH (ref 98–111)
Creatinine, Ser: 0.99 mg/dL (ref 0.61–1.24)
GFR, Estimated: >60 mL/min (ref >60)
Glucose, Bld: 88 mg/dL (ref 70–99)
Potassium: 3.9 mmol/L (ref 3.5–5.1)
Sodium: 138 mmol/L (ref 135–145)

## 2022-05-29 LAB — CBC
HCT: 31.8 % — ABNORMAL LOW (ref 39.0–52.0)
Hemoglobin: 10.6 g/dL — ABNORMAL LOW (ref 13.0–17.0)
MCH: 33.9 pg (ref 26.0–34.0)
MCHC: 33.3 g/dL (ref 30.0–36.0)
MCV: 101.6 fL — ABNORMAL HIGH (ref 80.0–100.0)
Platelets: 218 10*3/uL (ref 150–400)
RBC: 3.13 MIL/uL — ABNORMAL LOW (ref 4.22–5.81)
RDW: 15.9 % — ABNORMAL HIGH (ref 11.5–15.5)
WBC: 8.7 10*3/uL (ref 4.0–10.5)
nRBC: 0 % (ref 0.0–0.2)

## 2022-05-29 LAB — PHOSPHORUS: Phosphorus: 3.5 mg/dL (ref 2.5–4.6)

## 2022-05-29 LAB — MAGNESIUM: Magnesium: 2 mg/dL (ref 1.7–2.4)

## 2022-05-29 MED ORDER — CEFDINIR 300 MG PO CAPS: 300.0000 mg | ORAL_CAPSULE | Freq: Two times a day (BID) | ORAL | 0 refills | Status: AC

## 2022-05-29 MED ORDER — APIXABAN 5 MG PO TABS: 5.0000 mg | ORAL_TABLET | Freq: Two times a day (BID) | ORAL | 0 refills | Status: DC

## 2022-05-29 NOTE — Progress Notes (Signed)
Reviewed AVS with patient's caregiver, [name]. Thickened liquids provided by Speech given to patient. Assisted patient with getting dressed. Will escort patient with wheelchair when ready. No further needs.

## 2022-05-29 NOTE — Progress Notes (Signed)
Speech Language Pathology Treatment: Dysphagia  Patient Details Name: Richard Carter MRN: [id number] DOB: [birth year] Today's Date: 05/29/2022 Time: 1021-1173 SLP Time Calculation (min) (ACUTE ONLY): 30 min  Assessment / Plan / Recommendation Clinical Impression  Pt seen for ongoing diet tolerance of dysphagia diet -- pt has been on a dysphagia level 1 (puree) w/ Nectar liquids a recent previous admit>SNF>this admit. Caregiver, [name], in room stated pt was eating a "mostly pureed diet at home that I fixed him", per his description. Caregiver and pt were given education on the diet consistency(w/ Nectar liquids), preparation of Nectar liquids, and ordering of Nectar liquids. Pt is d/t Discharge Home w/ Caregiver, [name] per CM, MD. Pt alert, pleasant, and cooperative. NSG reported good toleration of current dysphagia diet. Pt on room air, afrebrile.    Caregiver, [name], was provided information on diet consistency and aspiration precautions; ordering information. Answered questions re: food prep and ordering of cans of thickener at local Pharmacy, [facility]. Discussed and gave information on the Dysphagia drink cup for more controlled drinking of liquids. [name] agreed and will speak to Son.   In setting of pt's presentation overall and the goal to reduce risk of aspiration, recommend continue Dysphagia 1 (puree) diet w/ Nectar consistency liquids for safer oral intake/swallowing -- general aspiration precautions; safe swallowing strategies including f/u, Dry swallow and throat clear/cough as needed/at end of meals. Recommend Pills in Puree for ease and safety of swallowing - Crushed as need be. Frequent oral care prior to/after po's.    MD/NSG/CM made aware of assessment results, recommendations including [place] services f/u, and discussion w/ pt/[name]. Supplies (Nectar liquids) and ordering information provided for D/C; handouts given on aspiration precautions/strategies to pt/[name], Caregiver. No  further questions. ST services available while pt is admitted.       HPI HPI: Pt is a 81 y.o. male with a history of CVA, paroxysmal atrial fibrillation, seizure disorder, hypertension, Alcohol Abuse and Advanced Dementia, previous dysphagia who presents with generalized weakness and altered mental status as well as possible facial droop first noted when the patient awoke this morning.  He was last seen at his baseline yesterday at 9 PM.  The patient himself is unable to give any history.  He is currently being treated with antibiotics for UTI and got his last dose yesterday per notes.  Pt was recently admitted 03/2022 presenting w/ lethargy and slurred speech, daily ETOH use.  MRI 04/20/2022 admit: Acute infarcts in the posterior left frontal cortex and subcortical  white matter, medial right parietal cortex and subcortical white  matter, and right frontal white matter, with additional small  subacute infarcts in the bilateral cerebellar hemispheres. No  hemorrhage, mass effect, or midline shift.  MRI this admit 05/27/2922: Expected evolution of infarcts on the prior study. No acute infarct.  Otherwise no significant change since recent prior study.      SLP Plan  All goals met      Recommendations for follow up therapy are one component of a multi-disciplinary discharge planning process, led by the attending physician.  Recommendations may be updated based on patient status, additional functional criteria and insurance authorization.    Recommendations  Diet recommendations: Dysphagia 1 (puree);Nectar-thick liquid Liquids provided via: Cup;Straw (monitor) Medication Administration: Whole meds with puree (vs need to Crush in Puree) Supervision: Staff to assist with self feeding;Full supervision/cueing for compensatory strategies Compensations: Minimize environmental distractions;Slow rate;Small sips/bites;Lingual sweep for clearance of pocketing;Follow solids with liquid;Multiple dry swallows after  each  bite/sip Postural Changes and/or Swallow Maneuvers: Out of bed for meals;Seated upright 90 degrees;Upright 30-60 min after meal                General recommendations:  (Dietician f/u; Palliative Care f/u) Oral Care Recommendations: Oral care BID;Oral care before and after PO;Staff/trained caregiver to provide oral care Follow Up Recommendations: Home health SLP (TBD) Assistance recommended at discharge: Frequent or constant Supervision/Assistance (d/t Cognitive decline) SLP Visit Diagnosis: Dysphagia, oropharyngeal phase (R13.12) (baseline Dementia; dysphagia previous admit) Plan: All goals met             Orinda Kenner, MS, CCC-SLP Speech Language Pathologist Rehab Services; [place] [phone] (ascom) Alycen Mack  05/29/2022, 1:39 PM

## 2022-05-29 NOTE — Discharge Summary (Addendum)
Physician Discharge Summary   Patient: Richard Carter MRN: [id number] DOB: [birth year]  Admit date:     05/27/2022  Discharge date: 05/29/22  Discharge Physician: Lurene Shadow   PCP: Marisue Ivan, MD   Recommendations at discharge:   Follow up with PCP in 1 week  Discharge Diagnoses: Principal Problem:   Stroke-like symptoms Active Problems:   UTI (urinary tract infection)   AF (paroxysmal atrial fibrillation) (HCC)   Seizures (HCC)   HTN (hypertension)   Dementia (HCC)   HLD (hyperlipidemia)   AKI (acute kidney injury) (HCC)   Acute metabolic encephalopathy   History of stroke   Protein-calorie malnutrition, severe   Dysphagia  Resolved Problems:   * No resolved hospital problems. *  Hospital Course: 81 y.o. male with medical history significant of dementia, history of stroke with left-sided weakness, recent stroke last month with dysarthria which had reportedly resolved, paroxysmal A-fib, severe left ICA stenosis, seizure disorder on Keppra, hypertension, hyperlipidemia, GERD, history of alcohol and tobacco abuse presented from his facility today with altered mental status and concern for strokelike symptoms, lethargy and generalized weakness.   Seen by neurology.  Stroke evaluation was negative.  Patient had an acute stroke a month ago, admitted here and discharge summary indicates patient developed GI bleeding when tried on Eliquis.  It appears patient has remained off Eliquis since then.  On this admission, neurologist recommended that Eliquis be restarted for recent stroke.    He was also found to have UTI and AKI.  He was treated with IV Rocephin.  Urine culture showed E. coli.  Based on previous culture and sensitivity report, he was discharged on Omnicef.  AKI improved with IV fluids.  His mental status has improved and overall condition has improved as well.  He is deemed stable for discharge to home today.  Discharge plan was discussed with the patient and his  caregiver, [name], at the bedside.      Consultants: Neurologist Procedures performed: None Disposition: Home Diet recommendation:  Discharge Diet Orders (From admission, onward)     Start     Ordered   05/29/22 0000  DIET - DYS 1       Question:  Fluid consistency:  Answer:  Nectar Thick   05/29/22 1240           Dysphagia type 1 nectar thick Liquid DISCHARGE MEDICATION: Allergies as of 05/29/2022   No Known Allergies      Medication List     STOP taking these medications    ciprofloxacin 500 MG tablet Commonly known as: CIPRO   magnesium oxide 400 MG tablet Commonly known as: MAG-OX   thiamine 100 MG tablet       TAKE these medications    amLODipine 5 MG tablet Commonly known as: NORVASC Take 1 tablet (5 mg total) by mouth daily.   apixaban 5 MG Tabs tablet Commonly known as: ELIQUIS Take 1 tablet (5 mg total) by mouth 2 (two) times daily.   atorvastatin 20 MG tablet Commonly known as: LIPITOR Take 20 mg by mouth daily.   cefdinir 300 MG capsule Commonly known as: OMNICEF Take 1 capsule (300 mg total) by mouth 2 (two) times daily for 3 days.   feeding supplement (NEPRO CARB STEADY) Liqd Take 237 mLs by mouth 3 (three) times daily between meals.   food thickener Gel Commonly known as: SIMPLYTHICK (NECTAR/LEVEL 2/MILDLY THICK) Take 1 packet by mouth as needed.   levETIRAcetam 500 MG tablet Commonly known as: KEPPRA  Take 1 tablet (500 mg total) by mouth 2 (two) times daily.   losartan 50 MG tablet Commonly known as: COZAAR Take 50 mg by mouth daily.   metoprolol succinate 25 MG 24 hr tablet Commonly known as: TOPROL-XL Take 0.5 tablets (12.5 mg total) by mouth daily.   multivitamin with minerals Tabs tablet Take 1 tablet by mouth daily.   nicotine 14 mg/24hr patch Commonly known as: NICODERM CQ - dosed in mg/24 hours Place 1 patch (14 mg total) onto the skin daily.   pantoprazole 40 MG tablet Commonly known as: Protonix Take 1  tablet (40 mg total) by mouth 2 (two) times daily. After sixty days the patient is to continue protonix at 40 mg daily.   thiamine 100 MG tablet Commonly known as: VITAMIN B-1 Take 100 mg by mouth daily.   traZODone 100 MG tablet Commonly known as: DESYREL Take 1 tablet (100 mg total) by mouth at bedtime.        Discharge Exam: Filed Weights   05/27/22 0831  Weight: 59 kg   GEN: NAD SKIN:Warm and dry EYES: No pallor or icterus ENT: MMM CV: RRR PULM: CTA B ABD: soft, ND, NT, +BS CNS: AAO x 2 (person and place), non focal EXT: No tenderness   Condition at discharge: good  The results of significant diagnostics from this hospitalization (including imaging, microbiology, ancillary and laboratory) are listed below for reference.   Imaging Studies: MR BRAIN WO CONTRAST  Result Date: 05/27/2022 CLINICAL DATA:  Mental status change, unknown cause EXAM: MRI HEAD WITHOUT CONTRAST TECHNIQUE: Multiplanar, multiecho pulse sequences of the brain and surrounding structures were obtained without intravenous contrast. COMPARISON:  04/20/2022 FINDINGS: Brain: Residual diffusion hyperintensity in the left frontal lobe and right parietal lobe at the site of infarcts seen on the prior study. No new abnormal diffusion. Prominence of the ventricles and sulci reflects stable parenchymal volume loss. Multiple chronic infarcts involving the deep gray nuclei and central white matter, bilateral occipital lobes, and cerebellum. Scattered foci of susceptibility likely reflect chronic blood products. There is no intracranial mass or mass effect. No extra-axial collection. Vascular: Major vessel flow voids at the skull base are preserved. Skull and upper cervical spine: Normal marrow signal is preserved. Sinuses/Orbits: Minor mucosal thickening.  Orbits are unremarkable. Other: Sella is unremarkable.  Mastoid air cells are clear. IMPRESSION: Expected evolution of infarcts on the prior study. No acute infarct.  Otherwise no significant change since recent prior study. Electronically Signed   By: Guadlupe Spanish M.D.   On: 05/27/2022 14:31   CT HEAD CODE STROKE WO CONTRAST`  Addendum Date: 05/27/2022   ADDENDUM REPORT: 05/27/2022 13:27 ADDENDUM: No evidence of acute intracranial abnormality. These results were called by telephone at the time of interpretation on 05/27/2022 at 9:18 am to provider Dr. Viviann Spare, who verbally acknowledged these results. Electronically Signed   By: Jackey Loge D.O.   On: 05/27/2022 13:27   Result Date: 05/27/2022 CLINICAL DATA:  Code stroke. Neuro deficit, acute, stroke suspected. EXAM: CT HEAD WITHOUT CONTRAST TECHNIQUE: Contiguous axial images were obtained from the base of the skull through the vertex without intravenous contrast. RADIATION DOSE REDUCTION: This exam was performed according to the departmental dose-optimization program which includes automated exposure control, adjustment of the mA and/or kV according to patient size and/or use of iterative reconstruction technique. COMPARISON:  Prior head CT examinations 04/23/2022 and earlier. Brain MRI 04/20/2022. FINDINGS: Brain: Moderate cerebral atrophy. Chronic cortical/subcortical infarct within the mid to posterior left frontal  lobe (with involvement of the precentral gyrus. Chronic cortical/subcortical infarcts within the right greater than left parietal lobes, and bilateral occipital lobes, some of which were better appreciated on the prior brain MRI 04/20/2022. Chronic small-vessel infarcts within the bilateral corona radiata/basal ganglia and within the right pons, some of which were better appreciated on the prior MRI. Background moderate patchy and ill-defined hypoattenuation within the cerebral white matter, nonspecific but compatible with chronic small vessel ischemic disease. Small chronic infarcts within the bilateral cerebellar hemispheres, some of which were better appreciated on the prior MRI. There is no acute  intracranial hemorrhage. No acute demarcated cortical infarct is identified. No extra-axial fluid collection. No evidence of an intracranial mass. No midline shift. Vascular: No hyperdense vessel. Atherosclerotic calcifications. Skull: No fracture or aggressive osseous lesion. Sinuses/Orbits: No mass or acute finding within the imaged orbits. Mild mucosal thickening within the bilateral frontal ethmoidal recesses, bilateral ethmoid sinuses and left maxillary sinus at the imaged levels. ASPECTS [facility] Stroke Program Early CT Score) - Ganglionic level infarction (caudate, lentiform nuclei, internal capsule, insula, M1-M3 cortex): 7 - Supraganglionic infarction (M4-M6 cortex): 3 Total score (0-10 with 10 being normal): 10 (when discounting chronic infarcts). These results were called by telephone at the time of interpretation on 05/27/2022 at 9:19 am to provider Dr. Viviann Spare, who verbally acknowledged these results. IMPRESSION: 1. No evidence of acute intracranial abnormality. 2. Chronic small vessel ischemic disease, multiple chronic infarcts and cerebral atrophy as described. 3. Mild paranasal sinus mucosal thickening. Electronically Signed: By: Jackey Loge D.O. On: 05/27/2022 09:20   CT ANGIO HEAD NECK W WO CM (CODE STROKE)  Result Date: 05/27/2022 CLINICAL DATA:  81 year old male code stroke presentation. EXAM: CT ANGIOGRAPHY HEAD AND NECK TECHNIQUE: Multidetector CT imaging of the head and neck was performed using the standard protocol during bolus administration of intravenous contrast. Multiplanar CT image reconstructions and MIPs were obtained to evaluate the vascular anatomy. Carotid stenosis measurements (when applicable) are obtained utilizing NASCET criteria, using the distal internal carotid diameter as the denominator. RADIATION DOSE REDUCTION: This exam was performed according to the departmental dose-optimization program which includes automated exposure control, adjustment of the mA and/or kV  according to patient size and/or use of iterative reconstruction technique. CONTRAST:  OMNIPAQUE IOHEXOL 350 MG/ML SOLN COMPARISON:  Plain head CT 0857 hours today. CTA head and neck 04/20/2022. FINDINGS: CTA NECK Skeleton: Absent dentition. Advanced cervical spine degeneration. Chronic bilateral rib fractures. No acute osseous abnormality identified. Upper chest: Negative. Other neck: Negative. Aortic arch: Calcified aortic atherosclerosis. Three vessel arch configuration. Right carotid system: Brachiocephalic artery, right CCA, and right ICA atherosclerosis appears stable since May. Up to 50% stenosis of the right ICA just distal to the bulb. Stable tortuosity. Left carotid system: Left CCA and bulky left carotid bifurcation atherosclerosis appears stable since May with high-grade stenosis at the bifurcation approaching radiographic string sign (series 4, image 94). Left ICA remains patent. Chronic vessel tortuosity. Vertebral arteries: Stable tortuosity and atherosclerosis of the proximal right subclavian artery and right vertebral artery origin. Mild to moderate right vertebral origin stenosis on series 7, image 108 appears stable. Non dominant right vertebral artery remains patent and diminutive to the skull base. Stable right V3 plaque without significant stenosis. Proximal left subclavian artery plaque is stable without significant stenosis. Left vertebral artery is dominant. Origin remains normal. Tortuous left V1 and V2 segments. Stable mild left V3 plaque with no significant stenosis to the skull base. CTA HEAD Posterior circulation: Atherosclerotic right V4  segment is non dominant and functionally terminates in PICA. A small contribution to the vertebrobasilar junction does remain patent. Bulky left V4 calcified plaque is stable with moderate to severe stenosis (series 6, image 135). Patent vertebrobasilar junction and basilar artery without stenosis. Patent SCA and PCA origins. Small left posterior  communicating artery, the right is diminutive or absent. Left PCA branches remain patent. There is mild to moderate superior division right P3 irregularity and stenosis on series 11, image 22. Right PCA branches remain patent with similar mild right P3 irregularity. Anterior circulation: Both ICA siphons remain patent. Bilateral siphon calcified plaque with up to moderate distal cavernous and anterior genu segment stenosis bilaterally. Patent carotid termini, MCA and ACA origins. Normal left posterior communicating artery. Diminutive or absent anterior communicating artery. Bilateral ACA branches are stable and within normal limits. Left MCA M1 segment and trifurcation are patent without stenosis. Right MCA M1 segment and trifurcation are patent without stenosis. Bilateral MCA branches are stable and within normal limits. Venous sinuses: Early contrast timing, not well evaluated. Anatomic variants: Dominant left vertebral artery, right functionally terminates in PICA. Review of the MIP images confirms the above findings IMPRESSION: 1. Negative for large vessel occlusion. 2. Stable CTA since May, with extensive atherosclerosis in the Neck and at the skull base: 3. - high-grade Left carotid bifurcation stenosis approaching a RADIOGRAPHIC STRING SIGN. - V4 segment plaque of the Dominant Left Vertebral Artery with moderate to severe stenosis. - up to Moderate bilateral ICA siphon stenosis. - 50% stenosis cervical right ICA. 4. Aortic Atherosclerosis (ICD10-I70.0). Electronically Signed   By: Odessa Fleming M.D.   On: 05/27/2022 09:46    Microbiology: Results for orders placed or performed during the hospital encounter of 05/27/22  Urine Culture     Status: Abnormal (Preliminary result)   Collection Time: 05/27/22  8:53 AM   Specimen: Urine, Clean Catch  Result Value Ref Range Status   Specimen Description   Final    URINE, CLEAN CATCH Performed at [facility], [address] [place], [place] [address]     Special Requests   Final    NONE Performed at [facility], [address] [place], [place] [address]    Culture (A)  Final    >=100,000 COLONIES/mL ESCHERICHIA COLI repeating susceptibility Performed at [facility], [address] [place], [place] [address]    Report Status PENDING  Incomplete    Labs: CBC: Recent Labs  Lab 05/27/22 0846 05/28/22 0528 05/29/22 0400  WBC 12.1* 11.9* 8.7  NEUTROABS 9.4*  --   --   HGB 12.2* 11.8* 10.6*  HCT 37.0* 34.8* 31.8*  MCV 103.6* 101.2* 101.6*  PLT 228 219 218   Basic Metabolic Panel: Recent Labs  Lab 05/27/22 0957 05/28/22 0528 05/29/22 0400  NA 137 141 138  K 4.3 3.7 3.9  CL 110 114* 113*  CO2 21* 22 22  GLUCOSE 93 85 88  BUN 37* 25* 28*  CREATININE 1.36* 0.92 0.99  CALCIUM 9.9 10.0 9.1  MG  --  1.6* 2.0  PHOS  --   --  3.5   Liver Function Tests: Recent Labs  Lab 05/27/22 0957  AST 31  ALT 20  ALKPHOS 58  BILITOT 0.8  PROT 7.0  ALBUMIN 3.5   CBG: Recent Labs  Lab 05/27/22 1515  GLUCAP 90    Discharge time spent: greater than 30 minutes.  Signed: Lurene Shadow, MD [facility] 05/29/2022

## 2022-05-29 NOTE — Consult Note (Signed)
   [facility] CM Inpatient Consult   05/29/2022  Richard Carter [birth year] [id number]  [facility] [[facility]]  Accountable Care Organization [ACO] Patient: [employer]  Primary Care Provider:  Marisue Ivan, MD, [facility]  Referral request:  Readmission review [facility] Quality Team  Patient reviewed for hospitalization with noted  with less than 30 days readmission and to assess for potential [facility]  [[facility]] Care Management service needs for post hospital transition.  Review of patient's medical record reveals patient returned home with caregiver noted. Was out of SNF days per LCSW notes.   Plan: Patient transitioned home with [facility] and caregiver noted.  Review information sent to [facility] Quality team.  For questions contact:   Charlesetta Shanks, RN BSN CCM [facility]  [phone] business mobile phone Toll free office [phone]  Fax number: [phone] [email or web address]

## 2022-05-30 DIAGNOSIS — N390 Urinary tract infection, site not specified: Secondary | ICD-10-CM | POA: Diagnosis not present

## 2022-05-30 DIAGNOSIS — Z8673 Personal history of transient ischemic attack (TIA), and cerebral infarction without residual deficits: Secondary | ICD-10-CM | POA: Diagnosis not present

## 2022-05-30 LAB — URINE CULTURE: Culture: >=100000 — AB

## 2022-06-04 DIAGNOSIS — Z8673 Personal history of transient ischemic attack (TIA), and cerebral infarction without residual deficits: Secondary | ICD-10-CM | POA: Diagnosis not present

## 2022-06-04 DIAGNOSIS — R8290 Unspecified abnormal findings in urine: Secondary | ICD-10-CM | POA: Diagnosis not present

## 2022-06-04 DIAGNOSIS — M25511 Pain in right shoulder: Secondary | ICD-10-CM | POA: Diagnosis not present

## 2022-06-04 DIAGNOSIS — Z87440 Personal history of urinary (tract) infections: Secondary | ICD-10-CM | POA: Diagnosis not present

## 2022-06-06 DIAGNOSIS — Z87440 Personal history of urinary (tract) infections: Secondary | ICD-10-CM | POA: Diagnosis not present

## 2022-06-06 DIAGNOSIS — R8290 Unspecified abnormal findings in urine: Secondary | ICD-10-CM | POA: Diagnosis not present

## 2022-06-27 ENCOUNTER — Emergency Department: Payer: Medicare HMO

## 2022-06-27 ENCOUNTER — Emergency Department
Admission: EM | Admit: 2022-06-27 | Discharge: 2022-06-27 | Disposition: A | Discharge status: Home / Self Care | Payer: Medicare HMO | Attending: Emergency Medicine | Admitting: Emergency Medicine

## 2022-06-27 ENCOUNTER — Other Ambulatory Visit: Payer: Self-pay

## 2022-06-27 DIAGNOSIS — K8020 Calculus of gallbladder without cholecystitis without obstruction: Secondary | ICD-10-CM | POA: Diagnosis not present

## 2022-06-27 DIAGNOSIS — R531 Weakness: Secondary | ICD-10-CM | POA: Diagnosis not present

## 2022-06-27 DIAGNOSIS — R4182 Altered mental status, unspecified: Secondary | ICD-10-CM | POA: Diagnosis not present

## 2022-06-27 DIAGNOSIS — I6523 Occlusion and stenosis of bilateral carotid arteries: Secondary | ICD-10-CM | POA: Diagnosis not present

## 2022-06-27 DIAGNOSIS — N200 Calculus of kidney: Secondary | ICD-10-CM | POA: Diagnosis not present

## 2022-06-27 DIAGNOSIS — N3000 Acute cystitis without hematuria: Secondary | ICD-10-CM

## 2022-06-27 DIAGNOSIS — I10 Essential (primary) hypertension: Secondary | ICD-10-CM | POA: Diagnosis not present

## 2022-06-27 DIAGNOSIS — G319 Degenerative disease of nervous system, unspecified: Secondary | ICD-10-CM | POA: Diagnosis not present

## 2022-06-27 DIAGNOSIS — F0390 Unspecified dementia without behavioral disturbance: Secondary | ICD-10-CM | POA: Diagnosis not present

## 2022-06-27 DIAGNOSIS — R791 Abnormal coagulation profile: Secondary | ICD-10-CM | POA: Diagnosis not present

## 2022-06-27 DIAGNOSIS — F17200 Nicotine dependence, unspecified, uncomplicated: Secondary | ICD-10-CM | POA: Insufficient documentation

## 2022-06-27 LAB — CBC WITH DIFFERENTIAL/PLATELET
Abs Immature Granulocytes: 0.04 10*3/uL (ref 0.00–0.07)
Basophils Absolute: 0 10*3/uL (ref 0.0–0.1)
Basophils Relative: 1 %
Eosinophils Absolute: 0.3 10*3/uL (ref 0.0–0.5)
Eosinophils Relative: 4 %
HCT: 30.9 % — ABNORMAL LOW (ref 39.0–52.0)
Hemoglobin: 10 g/dL — ABNORMAL LOW (ref 13.0–17.0)
Immature Granulocytes: 1 %
Lymphocytes Relative: 19 %
Lymphs Abs: 1.6 10*3/uL (ref 0.7–4.0)
MCH: 32.7 pg (ref 26.0–34.0)
MCHC: 32.4 g/dL (ref 30.0–36.0)
MCV: 101 fL — ABNORMAL HIGH (ref 80.0–100.0)
Monocytes Absolute: 0.8 10*3/uL (ref 0.1–1.0)
Monocytes Relative: 10 %
Neutro Abs: 5.6 10*3/uL (ref 1.7–7.7)
Neutrophils Relative %: 65 %
Platelets: 228 10*3/uL (ref 150–400)
RBC: 3.06 MIL/uL — ABNORMAL LOW (ref 4.22–5.81)
RDW: 15.3 % (ref 11.5–15.5)
WBC: 8.3 10*3/uL (ref 4.0–10.5)
nRBC: 0 % (ref 0.0–0.2)

## 2022-06-27 LAB — COMPREHENSIVE METABOLIC PANEL
ALT: 13 U/L (ref 0–44)
AST: 19 U/L (ref 15–41)
Albumin: 3.7 g/dL (ref 3.5–5.0)
Alkaline Phosphatase: 58 U/L (ref 38–126)
Anion gap: 4 — ABNORMAL LOW (ref 5–15)
BUN: 18 mg/dL (ref 8–23)
CO2: 24 mmol/L (ref 22–32)
Calcium: 9.7 mg/dL (ref 8.9–10.3)
Chloride: 112 mmol/L — ABNORMAL HIGH (ref 98–111)
Creatinine, Ser: 1.03 mg/dL (ref 0.61–1.24)
GFR, Estimated: >60 mL/min (ref >60)
Glucose, Bld: 94 mg/dL (ref 70–99)
Potassium: 4.6 mmol/L (ref 3.5–5.1)
Sodium: 140 mmol/L (ref 135–145)
Total Bilirubin: 0.8 mg/dL (ref 0.3–1.2)
Total Protein: 6.9 g/dL (ref 6.5–8.1)

## 2022-06-27 LAB — URINALYSIS, COMPLETE (UACMP) WITH MICROSCOPIC
Bilirubin Urine: NEGATIVE
Glucose, UA: NEGATIVE mg/dL
Hgb urine dipstick: NEGATIVE
Ketones, ur: NEGATIVE mg/dL
Nitrite: NEGATIVE
Protein, ur: NEGATIVE mg/dL
Specific Gravity, Urine: 1.008 (ref 1.005–1.030)
WBC, UA: >50 WBC/hpf — ABNORMAL HIGH (ref 0–5)
pH: 6 (ref 5.0–8.0)

## 2022-06-27 LAB — AMMONIA: Ammonia: <10 umol/L (ref 9–35)

## 2022-06-27 LAB — PROTIME-INR
INR: 1.1 (ref 0.8–1.2)
Prothrombin Time: 14.4 seconds (ref 11.4–15.2)

## 2022-06-27 LAB — TSH: TSH: 2.059 u[IU]/mL (ref 0.350–4.500)

## 2022-06-27 LAB — APTT: aPTT: 34 seconds (ref 24–36)

## 2022-06-27 LAB — TROPONIN I (HIGH SENSITIVITY): Troponin I (High Sensitivity): 10 ng/L (ref <18)

## 2022-06-27 MED ORDER — LACTATED RINGERS IV BOLUS
1000.0000 mL | Freq: Once | INTRAVENOUS | Status: AC
  Administered 2022-06-27: 1000 mL via INTRAVENOUS

## 2022-06-27 MED ORDER — IOHEXOL 350 MG/ML SOLN
75.0000 mL | Freq: Once | INTRAVENOUS | Status: AC | PRN
  Administered 2022-06-27: 75 mL via INTRAVENOUS

## 2022-06-27 MED ORDER — SODIUM CHLORIDE 0.9 % IV SOLN
1.0000 g | Freq: Once | INTRAVENOUS | Status: AC
  Administered 2022-06-27: 1 g via INTRAVENOUS
  Filled 2022-06-27: qty 10

## 2022-06-27 MED ORDER — AMOXICILLIN-POT CLAVULANATE 875-125 MG PO TABS: 1.0000 | ORAL_TABLET | Freq: Two times a day (BID) | ORAL | 0 refills | Status: AC

## 2022-06-27 NOTE — ED Notes (Signed)
Pt back from CT

## 2022-06-27 NOTE — ED Notes (Signed)
Pt given milk as requested, water and food tray with verbal okay from EDP Smith. HOB elevated to 45 degrees. Pt eating.

## 2022-06-27 NOTE — ED Notes (Signed)
Pt's HR sustains at about 55bmp but continues to briefly drop to 45bmp at times; doesn't hold for longer than about 30 seconds to 1 minute at 45bmp.

## 2022-06-27 NOTE — ED Notes (Signed)
Pt accidentally urinated on floor next to bed when caregiver was attempting to help him use the urinal. Floor dried by with towel by this RN and EVS staff notified floor will need to be cleansed when able.

## 2022-06-27 NOTE — ED Provider Notes (Signed)
[facility] Provider Note    Event Date/Time   First MD Initiated Contact with Patient 06/27/22 [phone]     (approximate)   History   Altered Mental Status and Urinary Tract Infection   HPI  Richard Carter is a 81 y.o. male  with medical history significant of dementia, history of stroke with left-sided weakness, recent stroke last month with dysarthria which had reportedly resolved, paroxysmal A-fib, severe left ICA stenosis, seizure disorder on Keppra, hypertension, hyperlipidemia, GERD, history of alcohol and tobacco abuse and recent admission for altered mental status discharged on 7/5 with diagnosis of UTI discharged on Omnicef who presents for evaluation accompanied by caregiver for evaluation of some increased weakness and back pain experienced earlier today.  Caregiver notes that he has some pretty significant expressive aphasia at baseline and is not oriented at baseline and struggles with some residual right hemibody weakness and uses a walker with assistance to ambulate.  He is getting physical therapy.  He apparently is not able to stand and use his walker as he normally does sometime this morning] exactly clear when this was.  History is somewhat limited from the patient secondary to his baseline mental status being on oriented with significant facial is denying any acute pain.  Per caregiver and brother at bedside there have been no medication changes and they do not think he has had any recent falls or injuries or gotten any alcohol or illegal drugs.  Patient had reportedly been complaining of some lower back pain this morning and his urine has been very foul-smelling last couple days      Physical Exam  Triage Vital Signs: ED Triage Vitals [06/27/22 0949]  Enc Vitals Group     BP (!) 163/93     Pulse Rate (!) 52     Resp 16     Temp 97.8 F (36.6 C)     Temp Source Oral     SpO2 95 %     Weight 130 lb 1.1 oz (59 kg)     Height 5\' 5"  (1.651 m)      Head Circumference      Peak Flow      Pain Score 0     Pain Loc      Pain Edu?      Excl. in [place]?     Most recent vital signs: Vitals:   06/27/22 1330 06/27/22 1345  BP: (!) 166/78   Pulse:    Resp: 18 15  Temp:    SpO2:      General: Awake, no distress.  CV:  Good peripheral perfusion.  2+ radial pulses.  Slightly bradycardic. Resp:  Normal effort.  Clear bilaterally. Abd:  No distention.  Soft. Other:  Patient is fairly dysarthric.  Cranial nerves II to XII appear grossly intact.  He is slightly weaker in the right hemibody compared to the left and has mild upper extremity tremor.  Sensation is intact to light touch all extremities.  Per caregiver at bedside this seems to be baseline.  He is able to stand and ambulate in place with some assistance and caregiver at bedside states he thinks this is better than what patient can do earlier this morning.   ED Results / Procedures / Treatments  Labs (all labs ordered are listed, but only abnormal results are displayed) Labs Reviewed  URINALYSIS, COMPLETE (UACMP) WITH MICROSCOPIC - Abnormal; Notable for the following components:      Result Value  Color, Urine STRAW (*)    APPearance HAZY (*)    Leukocytes,Ua LARGE (*)    WBC, UA >50 (*)    Bacteria, UA MANY (*)    All other components within normal limits  CBC WITH DIFFERENTIAL/PLATELET - Abnormal; Notable for the following components:   RBC 3.06 (*)    Hemoglobin 10.0 (*)    HCT 30.9 (*)    MCV 101.0 (*)    All other components within normal limits  COMPREHENSIVE METABOLIC PANEL - Abnormal; Notable for the following components:   Chloride 112 (*)    Anion gap 4 (*)    All other components within normal limits  URINE CULTURE  TSH  AMMONIA  APTT  PROTIME-INR  TROPONIN I (HIGH SENSITIVITY)     EKG  EKG is remarkable for A-fib with a rate of 47 with a family artifact but otherwise normal axis and unremarkable I also like evidence of acute  ischemia.   RADIOLOGY  CT head on my interpretation without evidence of hemorrhage, edema, there is what appears to be low-density that is new in the left thalamus.  There is also evidence of remote cortical infarcts and atrophy.  I reviewed radiology's interpretation.  CTA head and neck on my interpretation without evidence of large vessel occlusion.  I reviewed radiology interpretation and agree to findings of no large vessel occlusions and atherosclerosis without significant stenosis of the right carotid system as well as significant 80% stenosis of the left common carotid and 50% stenosis of the left internal carotid as well as mild stenosis of the distal vertebral arteries.  CT on pelvis on my interpretation shows no significant aortic atherosclerosis, CAD, cholelithiasis without evidence of cholecystitis as well as a moderate hiatal hernia, prostamegaly and multiple bilateral nonobstructing kidney stones without any ureteral stones or evidence of  stranding or other acute abdominal or pelvic process.  MR brain on my interpretation without clear evidence of acute ischemia.  I reviewed radiologist interpretation and agree with the findings of patient agreed exam without evidence of acute process and known late subacute cortical infarcts with posterior frontal lobe and right parietal frontal lobe involvement as well as background atrophy and chronic small vessel disease.  PROCEDURES:  Critical Care performed: No  .1-3 Lead EKG Interpretation  Performed by: Gilles Chiquito, MD Authorized by: Gilles Chiquito, MD     Interpretation: non-specific     ECG rate assessment: bradycardic     Rhythm: atrial fibrillation     Ectopy: none     The patient is on the cardiac monitor to evaluate for evidence of arrhythmia and/or significant heart rate changes.   MEDICATIONS ORDERED IN ED: Medications  cefTRIAXone (ROCEPHIN) 1 g in sodium chloride 0.9 % 100 mL IVPB (1 g Intravenous New Bag/Given  06/27/22 1344)  lactated ringers bolus 1,000 mL (1,000 mLs Intravenous New Bag/Given 06/27/22 1059)  iohexol (OMNIPAQUE) 350 MG/ML injection 75 mL (75 mLs Intravenous Contrast Given 06/27/22 1314)     IMPRESSION / MDM / ASSESSMENT AND PLAN / ED COURSE  I reviewed the triage vital signs and the nursing notes. Patient's presentation is most consistent with acute presentation with potential threat to life or bodily function.                               Differential diagnosis includes, but is not limited to UTI, kidney stone, ACS, pyelonephritis, metabolic derangements possible TIA/CVA.  No  history exam features to suggest acute injury.   CBC without leukocytosis and hemoglobin of 10 compared to 10.64 weeks ago.  Normal platelets.  CMP without any significant lecture metabolic derangements.  TSH and ammonia are unremarkable.  PTT and INR are unremarkable.  Troponin is nonelevated not suggestive of ACS.  CT head on my interpretation without evidence of hemorrhage, edema, there is what appears to be low-density that is new in the left thalamus.  There is also evidence of remote cortical infarcts and atrophy.  I reviewed radiology's interpretation.  CT on pelvis on my interpretation shows no significant aortic atherosclerosis, CAD, cholelithiasis without evidence of cholecystitis as well as a moderate hiatal hernia, prostamegaly and multiple bilateral nonobstructing kidney stones without any ureteral stones or evidence of  stranding or other acute abdominal or pelvic process.  Given findings on CT concerning for interval infarct somewhat unclear acuity and if related to weakness reported today I reached out to on-call neurologist Dr. Iver Nestle, Karmen Bongo, MD came and evaluated the patient and recommended CTA vessel imaging as well as MR brain.   CTA head and neck on my interpretation without evidence of large vessel occlusion.  I reviewed radiology interpretation and agree to findings of no large vessel  occlusions and atherosclerosis without significant stenosis of the right carotid system as well as significant 80% stenosis of the left common carotid and 50% stenosis of the left internal carotid as well as mild stenosis of the distal vertebral arteries.  MR brain on my interpretation without clear evidence of acute ischemia.  I reviewed radiologist interpretation and agree with the findings of patient agreed exam without evidence of acute process and known late subacute cortical infarcts with posterior frontal lobe and right parietal frontal lobe involvement as well as background atrophy and chronic small vessel disease.  I discussed with on-call neurologist who agrees that they have is no evidence of acute stroke or indication for further neurological work-up at this time.  We will treat with a course of Augmentin for UTI given past recent culture data shows E. coli infection that is sensitive to this.  Discharged in stable condition.  Strict return precautions advised and discussed.  Patient will follow-up with PCP.    FINAL CLINICAL IMPRESSION(S) / ED DIAGNOSES   Final diagnoses:  Acute cystitis without hematuria  Weakness     Rx / DC Orders   ED Discharge Orders     None        Note:  This document was prepared using Dragon voice recognition software and may include unintentional dictation errors.   Gilles Chiquito, MD 06/27/22 [phone]

## 2022-06-27 NOTE — ED Notes (Addendum)
Urine sample sent to lab. Neuro provider at bedside assessing pt. Caregiver remains with pt. Caregiver states pt is altered at baseline but more so recently; caregiver unsure to what extent specifically that pt altered.

## 2022-06-27 NOTE — ED Notes (Signed)
Lt grn tube sent with blue tube to lab. Caregiver with pt. Pt alert and calm.

## 2022-06-27 NOTE — ED Notes (Signed)
Imaging staff with pt.

## 2022-06-27 NOTE — ED Notes (Signed)
Caregiver to bedside.

## 2022-06-27 NOTE — Consult Note (Signed)
Neurology Consultation Reason for Consult: hypodensity on head CT Requesting Physician: Antoine Primas  CC: Back pain  History is obtained from:  HPI: Richard Carter is a 81 y.o. 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  He denies any focal weakness.  Patient initially presented to his PCP, he was referred to the ED for further evaluation, head CT was obtained for confusion which revealed an age-indeterminate hypodensity in the left thalamus, new from 05/27/2022  Caregiver denies any noticeable worsening of the patient's right-sided weakness or any other new concerns other than his urinary frequency and volume as well as the morning episode of back pain which is not unusual  He has not missed any doses of Eliquis except this morning's dose due to his back pain and going to the doctor for evaluation of the same.  LKW: Unknown  tPA given?:  No given unable to determine last known well and last dose of Eliquis 6 PM last night IA performed?: No, baseline  Premorbid modified rankin scale: 3 (ambulates but requires assistance for bathing)     3 - Moderate disability. Requires some help, but able to walk unassisted.   ROS: Unable to obtain due to altered mental status.   Past Medical History:  Diagnosis Date   Alcohol abuse    Dementia (HCC)    GERD (gastroesophageal reflux disease)    HLD (hyperlipidemia)    HTN (hypertension)    Seizures (HCC)    Stroke [facility])    Past Surgical History:  Procedure Laterality Date   skin cyst removal     Current Outpatient Medications  Medication  Instructions   amLODipine (NORVASC) 5 mg, Oral, Daily   apixaban (ELIQUIS) 5 mg, Oral, 2 times daily   atorvastatin (LIPITOR) 20 mg, Oral, Daily   food thickener (SIMPLYTHICK, NECTAR/LEVEL 2/MILDLY THICK,) GEL 1 packet, Oral, As needed   levETIRAcetam (KEPPRA) 500 mg, Oral, 2 times daily   losartan (COZAAR) 50 mg, Oral, Daily   magnesium oxide (MAG-OX) 400 (240 Mg) MG tablet 1 tablet, Oral, Daily   metoprolol succinate (TOPROL-XL) 12.5 mg, Oral, Daily   Multiple Vitamin (MULTIVITAMIN WITH MINERALS) TABS tablet 1 tablet, Oral, Daily   nicotine (NICODERM CQ - DOSED IN MG/24 HOURS) 14 mg, Transdermal, Daily   Nutritional Supplements (FEEDING SUPPLEMENT, NEPRO CARB STEADY,) LIQD 237 mLs, Oral, 3 times daily between meals   pantoprazole (PROTONIX) 40 mg, Oral, 2 times daily, After sixty days the patient is to continue protonix at 40 mg daily.   thiamine (VITAMIN B1) 100 mg, Oral, Daily   traZODone (DESYREL) 100 mg, Oral, Daily at bedtime     Family History  Problem Relation Age of Onset   Stroke Mother    Cancer Brother     Social History:  reports that he has been smoking cigarettes. He has a 34.00 pack-year smoking history. He has never used smokeless tobacco. He reports current alcohol use. He reports that he does not use drugs.   Exam: Current vital signs: BP (!) 163/93 (BP Location: Left Arm)   Pulse (!) 52   Temp 97.8 F (36.6 C) (Oral)   Resp 16   Ht 5\' 5"  (1.651 m)  Wt 59 kg   SpO2 95%   BMI 21.64 kg/m  Vital signs in last 24 hours: Temp:  [97.8 F (36.6 C)] 97.8 F (36.6 C) (08/03 0949) Pulse Rate:  [52] 52 (08/03 0949) Resp:  [16] 16 (08/03 0949) BP: (163)/(93) 163/93 (08/03 0949) SpO2:  [95 %] 95 % (08/03 0949) Weight:  [59 kg] 59 kg (08/03 0949)   Physical Exam  Constitutional: Appears chronically ill and frail Psych: Affect pleasant and cooperative Eyes: No scleral injection HENT: No oropharyngeal obstruction.  MSK: Arthritic changes Cardiovascular:  Bradycardic to the 40s on monitor but otherwise perfusing extremities well, in sinus at this time Respiratory: Effort normal, non-labored breathing GI: Soft.  No distension. There is no tenderness.   Neuro: Mental Status: Patient is awake, alert, oriented to person, age but not month or situation.  Struggles with repeating complex sentences and cannot name stethoscope but can name simple objects and repeat simple sentences Cranial Nerves: II: Visual Fields are full. Pupils are equal, round, and reactive to light.  III,IV, VI: EOMI with mild bilateral ptosis, no diploplia.  V: Facial sensation is symmetric to light touch VII: Facial movement is symmetric.  VIII: hearing is intact to voice X: Uvula elevates symmetrically XI: Shoulder shrug is symmetric. XII: tongue is midline without atrophy or fasciculations.  Motor: Tone is normal. Bulk is normal.  Patient had some mild weakness of the right upper extremity for minus in the deltoid versus 4+ on the left deltoid.  Exam is somewhat variable due patient effort/attention, but caregiver at bedside reports his strength is currently at baseline Sensory: Sensation is symmetric to light touch in all 4 extremities Deep Tendon Reflexes: 3+ and symmetric in the patellae Cerebellar: FNF and HKS are mildly ataxic bilaterally Gait:  Deferred   NIHSS total 3 (all baseline) Score breakdown: One-point for not answering month correctly, one-point for mild aphasia, one-point for mild dysarthria Performed at 12:30 PM.    I have reviewed labs in epic and the results pertinent to this consultation are:  Basic Metabolic Panel: Recent Labs  Lab 06/27/22 1054  NA 140  K 4.6  CL 112*  CO2 24  GLUCOSE 94  BUN 18  CREATININE 1.03  CALCIUM 9.7    CBC: Recent Labs  Lab 06/27/22 1054  WBC 8.3  NEUTROABS 5.6  HGB 10.0*  HCT 30.9*  MCV 101.0*  PLT 228    Coagulation Studies: Recent Labs    06/27/22 1156  LABPROT 14.4  INR 1.1    Lab  Results  Component Value Date   HGBA1C 5.4 04/20/2022    Lab Results  Component Value Date   CHOL 143 04/21/2022   HDL 70 04/21/2022   LDLCALC 59 04/21/2022   TRIG 72 04/21/2022   CHOLHDL 2.0 04/21/2022     I have reviewed the images obtained:  Head CT personally reviewed, agree with radiology:   1. New low-density at the left thalamus compatible with interval infarct, are there right-sided deficits? 2. Atrophy and remote cortical infarcts.   Impression: Patient appears to be at his baseline, and is medically optimized from a stroke risk perspective given he is already on Eliquis with recent labs showing good control of A1c and cholesterol.  He does need MRI brain to confirm it is safe to continue Eliquis, or to determine start date based on imaging  Recommendations: - MRI brain w/o, further recommendations to follow  Brooke Dare MD-PhD [facility] [phone] [facility] coverage for [facility] is from  8 AM to 4 AM in-house and 4 PM to 8 PM by telephone/video. 8 PM to 8 AM emergent questions or overnight urgent questions should be addressed to Teleneurology On-call or [facility] neurohospitalist; contact information can be found on AMION   Addendum --  MRI brain personally reviewed, no new acute intracranial process.  Therefore the head CT findings represent artifact

## 2022-06-27 NOTE — ED Notes (Signed)
Pt back to room.

## 2022-06-27 NOTE — ED Notes (Signed)
Pt calm, denies any needs, pt still eating, call bell within reach, stretcher locked low, rail up; caregiver away doing an errand; pt agrees to use call bell if needs help.

## 2022-06-27 NOTE — ED Notes (Signed)
Neuro provider done assessing pt. Caregiver remains with pt. Neuro provider currently updating ED provider Katrinka Blazing.

## 2022-06-27 NOTE — ED Triage Notes (Signed)
Pt here from [facility] with AMS and UTI. Pt was recently admitted for same, is having same symptoms. Pt is here with caregiver.

## 2022-06-27 NOTE — ED Notes (Signed)
External cath applied. Pt states he understands can urinate whenever needed as urine will be collected in canister. Caregiver remains with pt.

## 2022-06-27 NOTE — ED Notes (Signed)
Ascom to pt's caregiver for MRI screening.

## 2022-06-29 LAB — URINE CULTURE: Culture: 70000 — AB

## 2022-07-10 DIAGNOSIS — E785 Hyperlipidemia, unspecified: Secondary | ICD-10-CM | POA: Diagnosis not present

## 2022-07-10 DIAGNOSIS — I10 Essential (primary) hypertension: Secondary | ICD-10-CM | POA: Diagnosis not present

## 2022-07-10 DIAGNOSIS — I480 Paroxysmal atrial fibrillation: Secondary | ICD-10-CM | POA: Diagnosis not present

## 2022-07-10 DIAGNOSIS — E7800 Pure hypercholesterolemia, unspecified: Secondary | ICD-10-CM | POA: Diagnosis not present

## 2022-07-22 DIAGNOSIS — E7800 Pure hypercholesterolemia, unspecified: Secondary | ICD-10-CM | POA: Diagnosis not present

## 2022-07-22 DIAGNOSIS — F0150 Vascular dementia without behavioral disturbance: Secondary | ICD-10-CM | POA: Diagnosis not present

## 2022-07-22 DIAGNOSIS — G309 Alzheimer's disease, unspecified: Secondary | ICD-10-CM | POA: Diagnosis not present

## 2022-07-22 DIAGNOSIS — R300 Dysuria: Secondary | ICD-10-CM | POA: Diagnosis not present

## 2022-07-22 DIAGNOSIS — Z862 Personal history of diseases of the blood and blood-forming organs and certain disorders involving the immune mechanism: Secondary | ICD-10-CM | POA: Diagnosis not present

## 2022-07-22 DIAGNOSIS — D638 Anemia in other chronic diseases classified elsewhere: Secondary | ICD-10-CM | POA: Insufficient documentation

## 2022-07-22 DIAGNOSIS — F5104 Psychophysiologic insomnia: Secondary | ICD-10-CM | POA: Diagnosis not present

## 2022-07-22 DIAGNOSIS — Z8673 Personal history of transient ischemic attack (TIA), and cerebral infarction without residual deficits: Secondary | ICD-10-CM | POA: Diagnosis not present

## 2022-07-22 DIAGNOSIS — I480 Paroxysmal atrial fibrillation: Secondary | ICD-10-CM | POA: Diagnosis not present

## 2022-07-22 DIAGNOSIS — I10 Essential (primary) hypertension: Secondary | ICD-10-CM | POA: Diagnosis not present

## 2022-08-01 DIAGNOSIS — I480 Paroxysmal atrial fibrillation: Secondary | ICD-10-CM | POA: Diagnosis not present

## 2022-08-01 DIAGNOSIS — E7800 Pure hypercholesterolemia, unspecified: Secondary | ICD-10-CM | POA: Diagnosis not present

## 2022-08-01 DIAGNOSIS — I10 Essential (primary) hypertension: Secondary | ICD-10-CM | POA: Diagnosis not present

## 2022-08-02 DIAGNOSIS — R300 Dysuria: Secondary | ICD-10-CM | POA: Diagnosis not present

## 2022-08-02 DIAGNOSIS — N390 Urinary tract infection, site not specified: Secondary | ICD-10-CM | POA: Diagnosis not present

## 2022-08-02 DIAGNOSIS — Z862 Personal history of diseases of the blood and blood-forming organs and certain disorders involving the immune mechanism: Secondary | ICD-10-CM | POA: Diagnosis not present

## 2022-08-02 DIAGNOSIS — G309 Alzheimer's disease, unspecified: Secondary | ICD-10-CM | POA: Diagnosis not present

## 2022-08-02 DIAGNOSIS — F0150 Vascular dementia without behavioral disturbance: Secondary | ICD-10-CM | POA: Diagnosis not present

## 2022-08-02 DIAGNOSIS — R600 Localized edema: Secondary | ICD-10-CM | POA: Diagnosis not present

## 2022-08-02 DIAGNOSIS — F0280 Dementia in other diseases classified elsewhere without behavioral disturbance: Secondary | ICD-10-CM | POA: Diagnosis not present

## 2022-08-05 DIAGNOSIS — Z8673 Personal history of transient ischemic attack (TIA), and cerebral infarction without residual deficits: Secondary | ICD-10-CM | POA: Diagnosis not present

## 2022-08-05 DIAGNOSIS — G309 Alzheimer's disease, unspecified: Secondary | ICD-10-CM | POA: Diagnosis not present

## 2022-08-05 DIAGNOSIS — R001 Bradycardia, unspecified: Secondary | ICD-10-CM | POA: Diagnosis not present

## 2022-08-05 DIAGNOSIS — E7800 Pure hypercholesterolemia, unspecified: Secondary | ICD-10-CM | POA: Diagnosis not present

## 2022-08-05 DIAGNOSIS — F0280 Dementia in other diseases classified elsewhere without behavioral disturbance: Secondary | ICD-10-CM | POA: Diagnosis not present

## 2022-08-05 DIAGNOSIS — I480 Paroxysmal atrial fibrillation: Secondary | ICD-10-CM | POA: Diagnosis not present

## 2022-08-05 DIAGNOSIS — I10 Essential (primary) hypertension: Secondary | ICD-10-CM | POA: Diagnosis not present

## 2022-08-05 DIAGNOSIS — F0150 Vascular dementia without behavioral disturbance: Secondary | ICD-10-CM | POA: Diagnosis not present

## 2022-08-05 DIAGNOSIS — Z66 Do not resuscitate: Secondary | ICD-10-CM | POA: Diagnosis not present

## 2022-08-19 DIAGNOSIS — R569 Unspecified convulsions: Secondary | ICD-10-CM | POA: Diagnosis not present

## 2022-08-19 DIAGNOSIS — G309 Alzheimer's disease, unspecified: Secondary | ICD-10-CM | POA: Diagnosis not present

## 2022-08-19 DIAGNOSIS — F0280 Dementia in other diseases classified elsewhere without behavioral disturbance: Secondary | ICD-10-CM | POA: Diagnosis not present

## 2022-08-19 DIAGNOSIS — Z8673 Personal history of transient ischemic attack (TIA), and cerebral infarction without residual deficits: Secondary | ICD-10-CM | POA: Diagnosis not present

## 2022-08-19 DIAGNOSIS — F0150 Vascular dementia without behavioral disturbance: Secondary | ICD-10-CM | POA: Diagnosis not present

## 2022-08-24 ENCOUNTER — Emergency Department
Admission: EM | Admit: 2022-08-24 | Discharge: 2022-08-24 | Disposition: A | Discharge status: Home / Self Care | Payer: Medicare HMO | Attending: Emergency Medicine | Admitting: Emergency Medicine

## 2022-08-24 ENCOUNTER — Other Ambulatory Visit: Payer: Self-pay

## 2022-08-24 ENCOUNTER — Encounter: Payer: Self-pay | Admitting: Intensive Care

## 2022-08-24 ENCOUNTER — Emergency Department: Payer: Medicare HMO

## 2022-08-24 DIAGNOSIS — R112 Nausea with vomiting, unspecified: Secondary | ICD-10-CM | POA: Diagnosis not present

## 2022-08-24 DIAGNOSIS — N3090 Cystitis, unspecified without hematuria: Secondary | ICD-10-CM | POA: Insufficient documentation

## 2022-08-24 DIAGNOSIS — I4891 Unspecified atrial fibrillation: Secondary | ICD-10-CM | POA: Diagnosis not present

## 2022-08-24 DIAGNOSIS — F0390 Unspecified dementia without behavioral disturbance: Secondary | ICD-10-CM | POA: Diagnosis not present

## 2022-08-24 DIAGNOSIS — R059 Cough, unspecified: Secondary | ICD-10-CM | POA: Diagnosis not present

## 2022-08-24 DIAGNOSIS — R1110 Vomiting, unspecified: Secondary | ICD-10-CM | POA: Diagnosis not present

## 2022-08-24 DIAGNOSIS — Z20822 Contact with and (suspected) exposure to covid-19: Secondary | ICD-10-CM | POA: Insufficient documentation

## 2022-08-24 DIAGNOSIS — K8020 Calculus of gallbladder without cholecystitis without obstruction: Secondary | ICD-10-CM | POA: Diagnosis not present

## 2022-08-24 DIAGNOSIS — K760 Fatty (change of) liver, not elsewhere classified: Secondary | ICD-10-CM | POA: Diagnosis not present

## 2022-08-24 DIAGNOSIS — N3289 Other specified disorders of bladder: Secondary | ICD-10-CM | POA: Diagnosis not present

## 2022-08-24 DIAGNOSIS — N390 Urinary tract infection, site not specified: Secondary | ICD-10-CM | POA: Diagnosis not present

## 2022-08-24 DIAGNOSIS — R531 Weakness: Secondary | ICD-10-CM | POA: Diagnosis not present

## 2022-08-24 LAB — HEPATIC FUNCTION PANEL
ALT: 29 U/L (ref 0–44)
AST: 34 U/L (ref 15–41)
Albumin: 4.1 g/dL (ref 3.5–5.0)
Alkaline Phosphatase: 69 U/L (ref 38–126)
Bilirubin, Direct: <0.1 mg/dL (ref 0.0–0.2)
Total Bilirubin: 0.6 mg/dL (ref 0.3–1.2)
Total Protein: 7.3 g/dL (ref 6.5–8.1)

## 2022-08-24 LAB — RESP PANEL BY RT-PCR (FLU A&B, COVID) ARPGX2
Influenza A by PCR: NEGATIVE
Influenza B by PCR: NEGATIVE
SARS Coronavirus 2 by RT PCR: NEGATIVE

## 2022-08-24 LAB — URINALYSIS, ROUTINE W REFLEX MICROSCOPIC
Bilirubin Urine: NEGATIVE
Glucose, UA: NEGATIVE mg/dL
Hgb urine dipstick: NEGATIVE
Ketones, ur: NEGATIVE mg/dL
Nitrite: POSITIVE — AB
Protein, ur: NEGATIVE mg/dL
Specific Gravity, Urine: 1.026 (ref 1.005–1.030)
WBC, UA: >50 WBC/hpf — ABNORMAL HIGH (ref 0–5)
pH: 6 (ref 5.0–8.0)

## 2022-08-24 LAB — CBC
HCT: 35.3 % — ABNORMAL LOW (ref 39.0–52.0)
Hemoglobin: 11.5 g/dL — ABNORMAL LOW (ref 13.0–17.0)
MCH: 30 pg (ref 26.0–34.0)
MCHC: 32.6 g/dL (ref 30.0–36.0)
MCV: 92.2 fL (ref 80.0–100.0)
Platelets: 214 10*3/uL (ref 150–400)
RBC: 3.83 MIL/uL — ABNORMAL LOW (ref 4.22–5.81)
RDW: 14.7 % (ref 11.5–15.5)
WBC: 8 10*3/uL (ref 4.0–10.5)
nRBC: 0 % (ref 0.0–0.2)

## 2022-08-24 LAB — BASIC METABOLIC PANEL
Anion gap: 7 (ref 5–15)
BUN: 21 mg/dL (ref 8–23)
CO2: 23 mmol/L (ref 22–32)
Calcium: 10 mg/dL (ref 8.9–10.3)
Chloride: 108 mmol/L (ref 98–111)
Creatinine, Ser: 1.17 mg/dL (ref 0.61–1.24)
GFR, Estimated: >60 mL/min (ref >60)
Glucose, Bld: 136 mg/dL — ABNORMAL HIGH (ref 70–99)
Potassium: 3.7 mmol/L (ref 3.5–5.1)
Sodium: 138 mmol/L (ref 135–145)

## 2022-08-24 LAB — LIPASE, BLOOD: Lipase: 36 U/L (ref 11–51)

## 2022-08-24 LAB — MAGNESIUM: Magnesium: 1.5 mg/dL — ABNORMAL LOW (ref 1.7–2.4)

## 2022-08-24 LAB — TROPONIN I (HIGH SENSITIVITY): Troponin I (High Sensitivity): 11 ng/L (ref <18)

## 2022-08-24 MED ORDER — CEPHALEXIN 500 MG PO CAPS
500.0000 mg | ORAL_CAPSULE | Freq: Once | ORAL | Status: AC
  Administered 2022-08-24: 500 mg via ORAL
  Filled 2022-08-24: qty 1

## 2022-08-24 MED ORDER — IOHEXOL 300 MG/ML  SOLN
100.0000 mL | Freq: Once | INTRAMUSCULAR | Status: AC | PRN
  Administered 2022-08-24: 100 mL via INTRAVENOUS

## 2022-08-24 MED ORDER — ONDANSETRON 4 MG PO TBDP: 4.0000 mg | ORAL_TABLET | Freq: Three times a day (TID) | ORAL | 0 refills | Status: DC | PRN

## 2022-08-24 MED ORDER — ONDANSETRON HCL 4 MG/2ML IJ SOLN
4.0000 mg | Freq: Once | INTRAMUSCULAR | Status: AC
  Administered 2022-08-24: 4 mg via INTRAVENOUS
  Filled 2022-08-24: qty 2

## 2022-08-24 MED ORDER — SODIUM CHLORIDE 0.9 % IV BOLUS
1000.0000 mL | Freq: Once | INTRAVENOUS | Status: AC
  Administered 2022-08-24: 1000 mL via INTRAVENOUS

## 2022-08-24 MED ORDER — CEFDINIR 300 MG PO CAPS: 300.0000 mg | ORAL_CAPSULE | Freq: Two times a day (BID) | ORAL | 0 refills | Status: DC

## 2022-08-24 NOTE — ED Triage Notes (Addendum)
Patient has not eaten since Sunday except some jello. Reports he has not wanted anything. Weakness X1 week. Emesis started yesterday. Denies pain.   History of stroke which left him with right sided deficits and aphasia

## 2022-08-24 NOTE — Discharge Instructions (Signed)
Your urine test and CT scan shows a bladder infection.  The rest of your evaluation is reassuring.  Continue taking all of your home medications, and start taking the new antibiotic prescription that was sent to your pharmacy.  Please follow-up with your primary care doctor this week for continued monitoring of your symptoms.

## 2022-08-24 NOTE — ED Provider Notes (Signed)
[facility] Provider Note    Event Date/Time   First MD Initiated Contact with Patient 08/24/22 1521     (approximate)   History   Chief Complaint: Emesis   HPI  Richard Carter is a 81 y.o. male with a history of stroke, dementia, recurrent UTI, GERD, alcohol abuse, atrial fibrillation who is brought to the ED due to generalized weakness for the last week.  Very poor oral intake during this time.  No falls or trauma, no fever.  He started having vomiting yesterday.  No constipation or diarrhea.  No chest pain or trouble breathing.  He does have some mild cough.  Patient has chronic aphasia due to prior stroke.  Caregiver at bedside is providing history.     Physical Exam   Triage Vital Signs: ED Triage Vitals [08/24/22 1401]  Enc Vitals Group     BP 123/86     Pulse Rate 61     Resp 16     Temp (!) 97.5 F (36.4 C)     Temp Source Oral     SpO2 98 %     Weight 133 lb (60.3 kg)     Height 5' 5.5" (1.664 m)     Head Circumference      Peak Flow      Pain Score 0     Pain Loc      Pain Edu?      Excl. in [place]?     Most recent vital signs: Vitals:   08/24/22 1845 08/24/22 1900  BP:  (!) 150/90  Pulse: (!) 58 75  Resp: 13   Temp:    SpO2: 100% 100%    General: Awake, no distress.  CV:  Good peripheral perfusion.  Bradycardia heart rate 50 Resp:  Normal effort.  Clear to auscultation bilaterally Abd:  No distention.  Soft nontender Other:  Dry mucous membranes.  No lower extremity edema.  No rash.  No evidence of trauma   ED Results / Procedures / Treatments   Labs (all labs ordered are listed, but only abnormal results are displayed) Labs Reviewed  BASIC METABOLIC PANEL - Abnormal; Notable for the following components:      Result Value   Glucose, Bld 136 (*)    All other components within normal limits  CBC - Abnormal; Notable for the following components:   RBC 3.83 (*)    Hemoglobin 11.5 (*)    HCT 35.3 (*)    All other  components within normal limits  URINALYSIS, ROUTINE W REFLEX MICROSCOPIC - Abnormal; Notable for the following components:   Color, Urine YELLOW (*)    APPearance HAZY (*)    Nitrite POSITIVE (*)    Leukocytes,Ua LARGE (*)    WBC, UA >50 (*)    Bacteria, UA RARE (*)    All other components within normal limits  MAGNESIUM - Abnormal; Notable for the following components:   Magnesium 1.5 (*)    All other components within normal limits  RESP PANEL BY RT-PCR (FLU A&B, COVID) [id number]  URINE CULTURE  HEPATIC FUNCTION PANEL  LIPASE, BLOOD  TROPONIN I (HIGH SENSITIVITY)     EKG    RADIOLOGY CT abdomen pelvis interpreted by me, negative for hernia, bowel obstruction, free air, intra-abdominal abscess.  Etiology report reviewed, noting signs of cystitis   PROCEDURES:  Procedures   MEDICATIONS ORDERED IN ED: Medications  cephALEXin (KEFLEX) capsule 500 mg (has no administration in time range)  sodium  chloride 0.9 % bolus 1,000 mL (0 mLs Intravenous Stopped 08/24/22 1827)  ondansetron (ZOFRAN) injection 4 mg (4 mg Intravenous Given 08/24/22 1634)  iohexol (OMNIPAQUE) 300 MG/ML solution 100 mL (100 mLs Intravenous Contrast Given 08/24/22 1649)     IMPRESSION / MDM / ASSESSMENT AND PLAN / ED COURSE  I reviewed the triage vital signs and the nursing notes.                              Differential diagnosis includes, but is not limited to, AKI, electrolyte abnormality, pancreatitis, GERD, viral illness, bowel obstruction, diverticulitis  Patient's presentation is most consistent with acute presentation with potential threat to life or bodily function.  Patient presents with fatigue, poor oral intake for a week along with vomiting that started yesterday.  Unable to relate any specific symptoms.  Appears somewhat dehydrated on exam, but no other focal abnormality.  No abdominal tenderness and it is soft and nondistended.  Due to the limited ability of patient to provide history of  participate in exam, I will obtain a CT abdomen pelvis along with chest x-ray labs and COVID test.  We will give IV fluids and Zofran.   ----------------------------------------- 8:02 PM on 08/24/2022 ----------------------------------------- Urinalysis and CT scan consistent with uncomplicated cystitis.  Patient is not septic, no evidence of ureteral obstruction or pyelonephritis.  Urine culture ordered.  Looking back at prior urine cultures has had resistance to Bactrim and Cipro.  Susceptibility to beta-lactam.  We will start Omnicef.      FINAL CLINICAL IMPRESSION(S) / ED DIAGNOSES   Final diagnoses:  Vomiting, unspecified vomiting type, unspecified whether nausea present  Cystitis     Rx / DC Orders   ED Discharge Orders          Ordered    cefdinir (OMNICEF) 300 MG capsule  2 times daily        08/24/22 2001    ondansetron (ZOFRAN-ODT) 4 MG disintegrating tablet  Every 8 hours PRN        08/24/22 2001             Note:  This document was prepared using Dragon voice recognition software and may include unintentional dictation errors.   Sharman Cheek, MD 08/24/22 2003

## 2022-08-24 NOTE — ED Notes (Signed)
Pt had soiled the bed around the purwick - pt cleaned and linens changed- new purwick placed on pt- will attempt to obtain urine later

## 2022-08-26 ENCOUNTER — Ambulatory Visit: Payer: Medicare HMO | Admitting: Urology

## 2022-08-26 ENCOUNTER — Encounter: Payer: Self-pay | Admitting: Urology

## 2022-08-26 ENCOUNTER — Ambulatory Visit: Payer: Self-pay | Admitting: Urology

## 2022-08-26 VITALS — BP 156/80 | HR 50 | Ht 66.0 in | Wt 133.0 lb

## 2022-08-26 DIAGNOSIS — R350 Frequency of micturition: Secondary | ICD-10-CM

## 2022-08-26 DIAGNOSIS — R8271 Bacteriuria: Secondary | ICD-10-CM

## 2022-08-26 DIAGNOSIS — R3915 Urgency of urination: Secondary | ICD-10-CM

## 2022-08-26 LAB — BLADDER SCAN AMB NON-IMAGING: Scan Result: 0

## 2022-08-26 MED ORDER — CEFUROXIME AXETIL 500 MG PO TABS: 500.0000 mg | ORAL_TABLET | Freq: Two times a day (BID) | ORAL | 0 refills | Status: AC

## 2022-08-26 NOTE — Progress Notes (Signed)
08/26/2022 2:02 PM   Richard Carter Jun 13, 1941 [id number]  Referring provider: Mortimer Fries, PA-C [facility] [place],   [id number]  Chief Complaint  Patient presents with   Recurrent UTI    HPI: Richard Carter is a 81 y.o. male referred for evaluation of recurrent UTI.  Past history significant for atrial fibrillation, CVA, seizure disorder, history of alcohol abuse and mix dementia (Alzheimer's/vascular).  His caregiver and Richard Carter was present who provided the history  Admitted [facility] 5/27-04/29/2022 and found to have acute embolic stroke with severe left ICA stenosis.  Symptoms include altered mental status (lethargy) and difficulty speaking.  Was noted to have pyuria and urine culture positive for E. coli though no other UTI symptoms Seen in ED 08/24/2022 for emesis.  Was noted to have pyuria and a CT was ordered which showed thickened bladder wall but no urinary tract calculi or hydronephrosis His nausea and vomiting has persisted No significant UTI symptoms including fever and dysuria.  He has had urinary incontinence however this could be secondary to recent CVA    PMH: Past Medical History:  Diagnosis Date   Alcohol abuse    Dementia ([place])    GERD (gastroesophageal reflux disease)    HLD (hyperlipidemia)    HTN (hypertension)    Seizures ([place])    Stroke [facility])     Surgical History: Past Surgical History:  Procedure Laterality Date   skin cyst removal      Home Medications:  Allergies as of 08/26/2022   No Known Allergies      Medication List        Accurate as of August 26, 2022  2:02 PM. If you have any questions, ask your nurse or doctor.          STOP taking these medications    feeding supplement (NEPRO CARB STEADY) Liqd Stopped by: Abbie Sons, MD   multivitamin with minerals Tabs tablet Stopped by: Abbie Sons, MD   nicotine 14 mg/24hr patch Commonly known as: NICODERM CQ - dosed in mg/24  hours Stopped by: Abbie Sons, MD   thiamine 100 MG tablet Commonly known as: VITAMIN B1 Stopped by: Abbie Sons, MD       TAKE these medications    amLODipine 5 MG tablet Commonly known as: NORVASC Take 1 tablet (5 mg total) by mouth daily.   apixaban 5 MG Tabs tablet Commonly known as: ELIQUIS Take 1 tablet (5 mg total) by mouth 2 (two) times daily.   atorvastatin 20 MG tablet Commonly known as: LIPITOR Take 20 mg by mouth daily.   cefdinir 300 MG capsule Commonly known as: OMNICEF Take 1 capsule (300 mg total) by mouth 2 (two) times daily.   food thickener Gel Commonly known as: SIMPLYTHICK (NECTAR/LEVEL 2/MILDLY THICK) Take 1 packet by mouth as needed.   levETIRAcetam 500 MG tablet Commonly known as: KEPPRA Take 1 tablet (500 mg total) by mouth 2 (two) times daily.   losartan 50 MG tablet Commonly known as: COZAAR Take 50 mg by mouth daily.   magnesium oxide 400 (240 Mg) MG tablet Commonly known as: MAG-OX Take 1 tablet by mouth daily.   metoprolol succinate 25 MG 24 hr tablet Commonly known as: TOPROL-XL Take 0.5 tablets (12.5 mg total) by mouth daily.   ondansetron 4 MG disintegrating tablet Commonly known as: ZOFRAN-ODT Take 1 tablet (4 mg total) by mouth every 8 (eight) hours as needed for nausea or vomiting.  pantoprazole 40 MG tablet Commonly known as: Protonix Take 1 tablet (40 mg total) by mouth 2 (two) times daily. After sixty days the patient is to continue protonix at 40 mg daily.   traZODone 100 MG tablet Commonly known as: DESYREL Take 1 tablet (100 mg total) by mouth at bedtime.        Allergies: No Known Allergies  Family History: Family History  Problem Relation Age of Onset   Stroke Mother    Cancer Brother     Social History:  reports that he quit smoking about 9 months ago. His smoking use included cigarettes. He has a 34.00 pack-year smoking history. He has never used smokeless tobacco. He reports that he does  not currently use alcohol. He reports that he does not use drugs.   Physical Exam: BP (!) 156/80   Pulse (!) 50   Ht 5\' 6"  (1.676 m)   Wt 133 lb (60.3 kg)   BMI 21.47 kg/m   Constitutional:  Alert, No acute distress. HEENT: [place] AT Respiratory: Normal respiratory effort, no increased work of breathing. GI: Abdomen is soft, nontender, nondistended, no abdominal masses    Pertinent Imaging: CT images were personally reviewed and interpreted  Assessment & Plan:    1.  Recurrent bacteriuria He may have asymptomatic bacteriuria and not true recurrent UTIs.  His dementia does and recent CVA does make diagnosis difficult.  No significant abnormalities on CT which would identify a potential pathologic cause of bacteriuria.  PVR today was 0 mL Urinary frequency, urgency incontinence may be secondary to detrusor overactivity from recent CVA and BPH We discussed the possibility of prostatitis and given an extended course of antibiotics.  Will await on his recent urine culture results and based on sensitivities treat with 3-4 weeks of antibiotic therapy.  If he has recurrent bacteriuria after treatment would only recommend treatment for symptoms strongly suspicious for UTI    , MD  [facility] [address] [place], [place] [phone]

## 2022-08-27 LAB — URINE CULTURE: Culture: >=100000 — AB

## 2022-08-29 ENCOUNTER — Telehealth: Payer: Self-pay

## 2022-08-29 NOTE — Telephone Encounter (Signed)
     Patient  visit on 9/30  at [facility]  Have you been able to follow up with your primary care physician? yes  The patient was or was not able to obtain any needed medicine or equipment. yes  Are there diet recommendations that you are having difficulty following? na  Patient expresses understanding of discharge instructions and education provided has no other needs at this time.  yes     Richard Carter [facility] Guide, [place], Care Management  [phone] [address], , [place] [address] Phone: [phone] Email: [email or web address]    

## 2022-09-12 DIAGNOSIS — Z87898 Personal history of other specified conditions: Secondary | ICD-10-CM | POA: Diagnosis not present

## 2022-09-12 DIAGNOSIS — R112 Nausea with vomiting, unspecified: Secondary | ICD-10-CM | POA: Diagnosis not present

## 2022-09-13 DIAGNOSIS — I10 Essential (primary) hypertension: Secondary | ICD-10-CM | POA: Diagnosis not present

## 2022-09-13 DIAGNOSIS — I480 Paroxysmal atrial fibrillation: Secondary | ICD-10-CM | POA: Diagnosis not present

## 2022-09-13 DIAGNOSIS — E7800 Pure hypercholesterolemia, unspecified: Secondary | ICD-10-CM | POA: Diagnosis not present

## 2022-10-02 DIAGNOSIS — N390 Urinary tract infection, site not specified: Secondary | ICD-10-CM | POA: Diagnosis not present

## 2022-10-02 DIAGNOSIS — J209 Acute bronchitis, unspecified: Secondary | ICD-10-CM | POA: Diagnosis not present

## 2022-10-02 DIAGNOSIS — A499 Bacterial infection, unspecified: Secondary | ICD-10-CM | POA: Diagnosis not present

## 2022-10-02 DIAGNOSIS — R058 Other specified cough: Secondary | ICD-10-CM | POA: Diagnosis not present

## 2022-10-02 DIAGNOSIS — K449 Diaphragmatic hernia without obstruction or gangrene: Secondary | ICD-10-CM | POA: Diagnosis not present

## 2022-10-02 DIAGNOSIS — R059 Cough, unspecified: Secondary | ICD-10-CM | POA: Diagnosis not present

## 2022-10-02 DIAGNOSIS — R300 Dysuria: Secondary | ICD-10-CM | POA: Diagnosis not present

## 2022-10-15 DIAGNOSIS — E7800 Pure hypercholesterolemia, unspecified: Secondary | ICD-10-CM | POA: Diagnosis not present

## 2022-10-15 DIAGNOSIS — I480 Paroxysmal atrial fibrillation: Secondary | ICD-10-CM | POA: Diagnosis not present

## 2022-10-15 DIAGNOSIS — I10 Essential (primary) hypertension: Secondary | ICD-10-CM | POA: Diagnosis not present

## 2022-10-30 DIAGNOSIS — R109 Unspecified abdominal pain: Secondary | ICD-10-CM | POA: Diagnosis not present

## 2022-10-30 DIAGNOSIS — K219 Gastro-esophageal reflux disease without esophagitis: Secondary | ICD-10-CM | POA: Diagnosis not present

## 2022-10-30 DIAGNOSIS — R42 Dizziness and giddiness: Secondary | ICD-10-CM | POA: Diagnosis not present

## 2022-11-12 DIAGNOSIS — H269 Unspecified cataract: Secondary | ICD-10-CM | POA: Diagnosis not present

## 2022-11-12 DIAGNOSIS — I4891 Unspecified atrial fibrillation: Secondary | ICD-10-CM | POA: Diagnosis not present

## 2022-11-12 DIAGNOSIS — Z5982 Transportation insecurity: Secondary | ICD-10-CM | POA: Diagnosis not present

## 2022-11-12 DIAGNOSIS — K219 Gastro-esophageal reflux disease without esophagitis: Secondary | ICD-10-CM | POA: Diagnosis not present

## 2022-11-12 DIAGNOSIS — Z87440 Personal history of urinary (tract) infections: Secondary | ICD-10-CM | POA: Diagnosis not present

## 2022-11-12 DIAGNOSIS — I69354 Hemiplegia and hemiparesis following cerebral infarction affecting left non-dominant side: Secondary | ICD-10-CM | POA: Diagnosis not present

## 2022-11-12 DIAGNOSIS — F17200 Nicotine dependence, unspecified, uncomplicated: Secondary | ICD-10-CM | POA: Diagnosis not present

## 2022-11-12 DIAGNOSIS — J439 Emphysema, unspecified: Secondary | ICD-10-CM | POA: Diagnosis not present

## 2022-11-12 DIAGNOSIS — I739 Peripheral vascular disease, unspecified: Secondary | ICD-10-CM | POA: Diagnosis not present

## 2022-11-12 DIAGNOSIS — F1021 Alcohol dependence, in remission: Secondary | ICD-10-CM | POA: Diagnosis not present

## 2022-11-12 DIAGNOSIS — E785 Hyperlipidemia, unspecified: Secondary | ICD-10-CM | POA: Diagnosis not present

## 2022-11-12 DIAGNOSIS — Z791 Long term (current) use of non-steroidal anti-inflammatories (NSAID): Secondary | ICD-10-CM | POA: Diagnosis not present

## 2022-11-12 DIAGNOSIS — D6869 Other thrombophilia: Secondary | ICD-10-CM | POA: Diagnosis not present

## 2022-11-12 DIAGNOSIS — H9193 Unspecified hearing loss, bilateral: Secondary | ICD-10-CM | POA: Diagnosis not present

## 2022-11-12 DIAGNOSIS — M1990 Unspecified osteoarthritis, unspecified site: Secondary | ICD-10-CM | POA: Diagnosis not present

## 2022-11-12 DIAGNOSIS — Z9181 History of falling: Secondary | ICD-10-CM | POA: Diagnosis not present

## 2022-11-12 DIAGNOSIS — I10 Essential (primary) hypertension: Secondary | ICD-10-CM | POA: Diagnosis not present

## 2022-12-07 ENCOUNTER — Emergency Department
Admission: EM | Admit: 2022-12-07 | Discharge: 2022-12-07 | Disposition: A | Discharge status: Home / Self Care | Payer: Medicare HMO | Attending: Emergency Medicine | Admitting: Emergency Medicine

## 2022-12-07 ENCOUNTER — Emergency Department: Payer: Medicare HMO

## 2022-12-07 ENCOUNTER — Other Ambulatory Visit: Payer: Self-pay

## 2022-12-07 DIAGNOSIS — I4891 Unspecified atrial fibrillation: Secondary | ICD-10-CM | POA: Diagnosis not present

## 2022-12-07 DIAGNOSIS — Z7901 Long term (current) use of anticoagulants: Secondary | ICD-10-CM | POA: Insufficient documentation

## 2022-12-07 DIAGNOSIS — R918 Other nonspecific abnormal finding of lung field: Secondary | ICD-10-CM | POA: Diagnosis not present

## 2022-12-07 DIAGNOSIS — J181 Lobar pneumonia, unspecified organism: Secondary | ICD-10-CM | POA: Insufficient documentation

## 2022-12-07 DIAGNOSIS — F0390 Unspecified dementia without behavioral disturbance: Secondary | ICD-10-CM | POA: Diagnosis not present

## 2022-12-07 DIAGNOSIS — J189 Pneumonia, unspecified organism: Secondary | ICD-10-CM | POA: Diagnosis not present

## 2022-12-07 DIAGNOSIS — R000 Tachycardia, unspecified: Secondary | ICD-10-CM | POA: Diagnosis not present

## 2022-12-07 DIAGNOSIS — R1111 Vomiting without nausea: Secondary | ICD-10-CM | POA: Diagnosis not present

## 2022-12-07 DIAGNOSIS — R079 Chest pain, unspecified: Secondary | ICD-10-CM | POA: Diagnosis not present

## 2022-12-07 DIAGNOSIS — R0789 Other chest pain: Secondary | ICD-10-CM | POA: Diagnosis not present

## 2022-12-07 DIAGNOSIS — Z20822 Contact with and (suspected) exposure to covid-19: Secondary | ICD-10-CM | POA: Diagnosis not present

## 2022-12-07 LAB — CBC
HCT: 33.7 % — ABNORMAL LOW (ref 39.0–52.0)
Hemoglobin: 11.2 g/dL — ABNORMAL LOW (ref 13.0–17.0)
MCH: 29.9 pg (ref 26.0–34.0)
MCHC: 33.2 g/dL (ref 30.0–36.0)
MCV: 89.9 fL (ref 80.0–100.0)
Platelets: 297 10*3/uL (ref 150–400)
RBC: 3.75 MIL/uL — ABNORMAL LOW (ref 4.22–5.81)
RDW: 14.8 % (ref 11.5–15.5)
WBC: 14.1 10*3/uL — ABNORMAL HIGH (ref 4.0–10.5)
nRBC: 0 % (ref 0.0–0.2)

## 2022-12-07 LAB — BASIC METABOLIC PANEL
Anion gap: 9 (ref 5–15)
BUN: 31 mg/dL — ABNORMAL HIGH (ref 8–23)
CO2: 22 mmol/L (ref 22–32)
Calcium: 9.7 mg/dL (ref 8.9–10.3)
Chloride: 103 mmol/L (ref 98–111)
Creatinine, Ser: 1.14 mg/dL (ref 0.61–1.24)
GFR, Estimated: >60 mL/min (ref >60)
Glucose, Bld: 121 mg/dL — ABNORMAL HIGH (ref 70–99)
Potassium: 3.8 mmol/L (ref 3.5–5.1)
Sodium: 134 mmol/L — ABNORMAL LOW (ref 135–145)

## 2022-12-07 LAB — RESP PANEL BY RT-PCR (RSV, FLU A&B, COVID)  RVPGX2
Influenza A by PCR: NEGATIVE
Influenza B by PCR: NEGATIVE
Resp Syncytial Virus by PCR: NEGATIVE
SARS Coronavirus 2 by RT PCR: NEGATIVE

## 2022-12-07 LAB — TROPONIN I (HIGH SENSITIVITY)
Troponin I (High Sensitivity): 13 ng/L (ref <18)
Troponin I (High Sensitivity): 11 ng/L (ref <18)

## 2022-12-07 MED ORDER — AMOXICILLIN-POT CLAVULANATE 875-125 MG PO TABS: 1.0000 | ORAL_TABLET | Freq: Two times a day (BID) | ORAL | 0 refills | Status: AC

## 2022-12-07 MED ORDER — AZITHROMYCIN 500 MG PO TABS
500.0000 mg | ORAL_TABLET | Freq: Once | ORAL | Status: AC
  Administered 2022-12-07: 500 mg via ORAL
  Filled 2022-12-07: qty 1

## 2022-12-07 MED ORDER — SODIUM CHLORIDE 0.9 % IV SOLN: 500.0000 mg | Freq: Once | INTRAVENOUS | Status: DC

## 2022-12-07 MED ORDER — LIDOCAINE VISCOUS HCL 2 % MT SOLN
15.0000 mL | Freq: Once | OROMUCOSAL | Status: AC
  Administered 2022-12-07: 15 mL via OROMUCOSAL
  Filled 2022-12-07: qty 15

## 2022-12-07 MED ORDER — AZITHROMYCIN 250 MG PO TABS: 250.0000 mg | ORAL_TABLET | Freq: Every day | ORAL | 0 refills | Status: AC

## 2022-12-07 MED ORDER — ALUM & MAG HYDROXIDE-SIMETH 200-200-20 MG/5ML PO SUSP
30.0000 mL | Freq: Once | ORAL | Status: AC
  Administered 2022-12-07: 30 mL via ORAL
  Filled 2022-12-07: qty 30

## 2022-12-07 MED ORDER — AMOXICILLIN-POT CLAVULANATE 875-125 MG PO TABS
1.0000 | ORAL_TABLET | Freq: Once | ORAL | Status: AC
  Administered 2022-12-07: 1 via ORAL
  Filled 2022-12-07: qty 1

## 2022-12-07 NOTE — ED Triage Notes (Addendum)
Pt to ED via [facility] from home. Pt CP that started last night and was sudden and sharp in nature. Pt did vomit prior to CP. Pt denies CP currently. Pt denies SOB, N/V/D, dizziness. Pt with hx stent placement. Pt is on eliquis.   HR 70 142/90 95% RA

## 2022-12-07 NOTE — ED Provider Notes (Signed)
[facility] Provider Note    Event Date/Time   First MD Initiated Contact with Patient 12/07/22 [phone]     (approximate)   History   Chest Pain   HPI  Richard Carter is a 82 y.o. male with a history of stroke, dementia, atrial fibrillation on Eliquis, and alcohol abuse who presents with an episode of chest pain.  The patient states that he started having chest pain last night around 8 PM.  He subsequently went to sleep.  When he awoke this morning the pain had resolved but after talking to his caregiver EMS was called.  The patient denies any chest pain at this time.  He has no shortness of breath, weakness or lightheadedness, nausea or vomiting, or leg swelling.  He does report a cough for the last couple of days.  I reviewed the past medical records.  The patient was most recently seen in the ED on 9/30 of last year for generalized weakness.  His most recent outpatient encounter was with his PMD Dr. Netty Starring on 12/6 for abdominal discomfort and a urinalysis was sent at that time.   Physical Exam   Triage Vital Signs: ED Triage Vitals  Enc Vitals Group     BP 12/07/22 0848 (!) 145/94     Pulse Rate 12/07/22 0848 74     Resp 12/07/22 0850 13     Temp 12/07/22 0853 97.6 F (36.4 C)     Temp Source 12/07/22 0853 Oral     SpO2 12/07/22 0848 96 %     Weight --      Height --      Head Circumference --      Peak Flow --      Pain Score 12/07/22 0849 0     Pain Loc --      Pain Edu? --      Excl. in [place]? --     Most recent vital signs: Vitals:   12/07/22 1030 12/07/22 1100  BP: (!) 150/73 (!) 151/88  Pulse: (!) 58 (!) 51  Resp: (!) 21 16  Temp:    SpO2: 98% 100%     General: Alert, oriented x 2, comfortable appearing. CV:  Good peripheral perfusion.  Normal heart sounds. Resp:  Normal effort.  Lungs CTAB. Abd:  Soft and nontender.  No distention.  Other:  No peripheral edema.  Motor intact in all extremities.   ED Results / Procedures /  Treatments   Labs (all labs ordered are listed, but only abnormal results are displayed) Labs Reviewed  BASIC METABOLIC PANEL - Abnormal; Notable for the following components:      Result Value   Sodium 134 (*)    Glucose, Bld 121 (*)    BUN 31 (*)    All other components within normal limits  CBC - Abnormal; Notable for the following components:   WBC 14.1 (*)    RBC 3.75 (*)    Hemoglobin 11.2 (*)    HCT 33.7 (*)    All other components within normal limits  RESP PANEL BY RT-PCR (RSV, FLU A&B, COVID)  [id number]  TROPONIN I (HIGH SENSITIVITY)  TROPONIN I (HIGH SENSITIVITY)     EKG  ED ECG REPORT I, Arta Silence, the attending physician, personally viewed and interpreted this ECG.  Date: 12/07/2022 EKG Time: 0849 Rate: 85 Rhythm: Atrial fibrillation QRS Axis: normal Intervals: Prolonged QTc ST/T Wave abnormalities: normal Narrative Interpretation: no evidence of acute ischemia  RADIOLOGY  Chest x-ray: I independently viewed and interpreted the images; there is a right lower lobe opacity with no evidence of edema  PROCEDURES:  Critical Care performed: No  Procedures   MEDICATIONS ORDERED IN ED: Medications  alum & mag hydroxide-simeth (MAALOX/MYLANTA) 200-200-20 MG/5ML suspension 30 mL (30 mLs Oral Given 12/07/22 1003)  lidocaine (XYLOCAINE) 2 % viscous mouth solution 15 mL (15 mLs Mouth/Throat Given 12/07/22 1003)  amoxicillin-clavulanate (AUGMENTIN) 875-125 MG per tablet 1 tablet (1 tablet Oral Given 12/07/22 1159)  azithromycin (ZITHROMAX) tablet 500 mg (500 mg Oral Given 12/07/22 1159)     IMPRESSION / MDM / ASSESSMENT AND PLAN / ED COURSE  I reviewed the triage vital signs and the nursing notes.  82 year old male with PMH as noted above presents with an episode of chest pain last night which has subsequently resolved.  He has had a nonproductive cough but review of systems is otherwise negative.  EMS reports that the caregiver stated he was at his  baseline mental status.  Physical exam is unremarkable for acute findings.  EKG is nonischemic.  Differential diagnosis includes, but is not limited to, musculoskeletal chest pain, GERD, radiculopathy or other nerve pain, pneumonia, bronchitis, less likely ACS.  We will obtain a chest x-ray, lab workup including cardiac enzymes, and reassess.  Patient's presentation is most consistent with acute presentation with potential threat to life or bodily function.  The patient is on the cardiac monitor to evaluate for evidence of arrhythmia and/or significant heart rate changes.  ----------------------------------------- 11:58 AM on 12/07/2022 -----------------------------------------  Chest x-ray shows a right lower lobe opacity which is concerning for pneumonia.  The WBC count is elevated which is consistent with this.  Respiratory panel is negative.  Given the location of pneumonia we will cover for aspiration.  Troponins are negative x 2.  The patient has not had any recurrence of chest pain.  I discussed the patient's case with his brother Yvone Neu.  I did consider whether the patient may require admission given his age and comorbidities, however at this time with normal vital signs, no oxygen requirement, no respiratory distress, and overall reassuring labs, there is no indication for inpatient treatment.  Yvone Neu confirms that the patient will be able to take prescribed medications at home and agrees with the discharge plan.  I have given a dose of azithromycin and Augmentin here in the ED.  I will prescribe the same for home.  I gave thorough return precautions post to the patient and his brother and they expressed understanding.   FINAL CLINICAL IMPRESSION(S) / ED DIAGNOSES   Final diagnoses:  Community acquired pneumonia of right lower lobe of lung     Rx / DC Orders   ED Discharge Orders          Ordered    azithromycin (ZITHROMAX) 250 MG tablet  Daily        12/07/22 1157     amoxicillin-clavulanate (AUGMENTIN) 875-125 MG tablet  2 times daily        12/07/22 1157             Note:  This document was prepared using Dragon voice recognition software and may include unintentional dictation errors.    Arta Silence, MD 12/07/22 1201

## 2022-12-07 NOTE — ED Notes (Signed)
Pt family member at bedside.

## 2022-12-07 NOTE — Discharge Instructions (Signed)
Take the antibiotics as prescribed and finish the full course.  Return to the ER for new, worsening, or persistent chest pain, difficulty breathing, high fever, weakness, or any other new or worsening symptoms that concern you.

## 2022-12-13 DIAGNOSIS — R051 Acute cough: Secondary | ICD-10-CM | POA: Diagnosis not present

## 2022-12-13 DIAGNOSIS — Z8701 Personal history of pneumonia (recurrent): Secondary | ICD-10-CM | POA: Diagnosis not present

## 2023-01-07 DIAGNOSIS — Z8701 Personal history of pneumonia (recurrent): Secondary | ICD-10-CM | POA: Diagnosis not present

## 2023-01-07 DIAGNOSIS — R0602 Shortness of breath: Secondary | ICD-10-CM | POA: Diagnosis not present

## 2023-01-07 DIAGNOSIS — R918 Other nonspecific abnormal finding of lung field: Secondary | ICD-10-CM | POA: Diagnosis not present

## 2023-01-20 DIAGNOSIS — F0280 Dementia in other diseases classified elsewhere without behavioral disturbance: Secondary | ICD-10-CM | POA: Diagnosis not present

## 2023-01-20 DIAGNOSIS — I480 Paroxysmal atrial fibrillation: Secondary | ICD-10-CM | POA: Diagnosis not present

## 2023-01-20 DIAGNOSIS — G309 Alzheimer's disease, unspecified: Secondary | ICD-10-CM | POA: Diagnosis not present

## 2023-01-20 DIAGNOSIS — E7800 Pure hypercholesterolemia, unspecified: Secondary | ICD-10-CM | POA: Diagnosis not present

## 2023-01-20 DIAGNOSIS — I10 Essential (primary) hypertension: Secondary | ICD-10-CM | POA: Diagnosis not present

## 2023-01-20 DIAGNOSIS — Z8673 Personal history of transient ischemic attack (TIA), and cerebral infarction without residual deficits: Secondary | ICD-10-CM | POA: Diagnosis not present

## 2023-01-20 DIAGNOSIS — Z23 Encounter for immunization: Secondary | ICD-10-CM | POA: Diagnosis not present

## 2023-01-20 DIAGNOSIS — F0150 Vascular dementia without behavioral disturbance: Secondary | ICD-10-CM | POA: Diagnosis not present

## 2023-01-20 DIAGNOSIS — Z862 Personal history of diseases of the blood and blood-forming organs and certain disorders involving the immune mechanism: Secondary | ICD-10-CM | POA: Diagnosis not present

## 2023-01-27 DIAGNOSIS — I10 Essential (primary) hypertension: Secondary | ICD-10-CM | POA: Diagnosis not present

## 2023-01-27 DIAGNOSIS — Z0000 Encounter for general adult medical examination without abnormal findings: Secondary | ICD-10-CM | POA: Diagnosis not present

## 2023-01-27 DIAGNOSIS — Z1331 Encounter for screening for depression: Secondary | ICD-10-CM | POA: Diagnosis not present

## 2023-01-27 DIAGNOSIS — K219 Gastro-esophageal reflux disease without esophagitis: Secondary | ICD-10-CM | POA: Diagnosis not present

## 2023-01-27 DIAGNOSIS — E7800 Pure hypercholesterolemia, unspecified: Secondary | ICD-10-CM | POA: Diagnosis not present

## 2023-01-27 DIAGNOSIS — I480 Paroxysmal atrial fibrillation: Secondary | ICD-10-CM | POA: Diagnosis not present

## 2023-01-27 DIAGNOSIS — G309 Alzheimer's disease, unspecified: Secondary | ICD-10-CM | POA: Diagnosis not present

## 2023-01-27 DIAGNOSIS — D649 Anemia, unspecified: Secondary | ICD-10-CM | POA: Diagnosis not present

## 2023-01-27 DIAGNOSIS — E785 Hyperlipidemia, unspecified: Secondary | ICD-10-CM | POA: Diagnosis not present

## 2023-02-16 ENCOUNTER — Observation Stay: Payer: Medicare HMO

## 2023-02-16 ENCOUNTER — Emergency Department: Payer: Medicare HMO

## 2023-02-16 ENCOUNTER — Inpatient Hospital Stay
Admission: EM | Admit: 2023-02-16 | Discharge: 2023-02-21 | DRG: 064 | Disposition: A | Discharge status: Home Health | Payer: Medicare HMO | Attending: Internal Medicine | Admitting: Internal Medicine

## 2023-02-16 ENCOUNTER — Other Ambulatory Visit: Payer: Self-pay

## 2023-02-16 DIAGNOSIS — I6381 Other cerebral infarction due to occlusion or stenosis of small artery: Secondary | ICD-10-CM | POA: Diagnosis not present

## 2023-02-16 DIAGNOSIS — G309 Alzheimer's disease, unspecified: Secondary | ICD-10-CM | POA: Diagnosis present

## 2023-02-16 DIAGNOSIS — I639 Cerebral infarction, unspecified: Secondary | ICD-10-CM | POA: Diagnosis present

## 2023-02-16 DIAGNOSIS — I480 Paroxysmal atrial fibrillation: Secondary | ICD-10-CM | POA: Diagnosis present

## 2023-02-16 DIAGNOSIS — R2990 Unspecified symptoms and signs involving the nervous system: Secondary | ICD-10-CM | POA: Diagnosis present

## 2023-02-16 DIAGNOSIS — K219 Gastro-esophageal reflux disease without esophagitis: Secondary | ICD-10-CM | POA: Diagnosis present

## 2023-02-16 DIAGNOSIS — R29810 Facial weakness: Secondary | ICD-10-CM | POA: Diagnosis present

## 2023-02-16 DIAGNOSIS — I6522 Occlusion and stenosis of left carotid artery: Secondary | ICD-10-CM | POA: Diagnosis present

## 2023-02-16 DIAGNOSIS — R1310 Dysphagia, unspecified: Secondary | ICD-10-CM | POA: Diagnosis present

## 2023-02-16 DIAGNOSIS — F0150 Vascular dementia without behavioral disturbance: Secondary | ICD-10-CM | POA: Diagnosis present

## 2023-02-16 DIAGNOSIS — R569 Unspecified convulsions: Secondary | ICD-10-CM | POA: Diagnosis present

## 2023-02-16 DIAGNOSIS — I6503 Occlusion and stenosis of bilateral vertebral arteries: Secondary | ICD-10-CM | POA: Diagnosis not present

## 2023-02-16 DIAGNOSIS — R319 Hematuria, unspecified: Secondary | ICD-10-CM

## 2023-02-16 DIAGNOSIS — Z66 Do not resuscitate: Secondary | ICD-10-CM | POA: Diagnosis present

## 2023-02-16 DIAGNOSIS — Z87891 Personal history of nicotine dependence: Secondary | ICD-10-CM | POA: Diagnosis not present

## 2023-02-16 DIAGNOSIS — F0280 Dementia in other diseases classified elsewhere without behavioral disturbance: Secondary | ICD-10-CM | POA: Diagnosis present

## 2023-02-16 DIAGNOSIS — Z7901 Long term (current) use of anticoagulants: Secondary | ICD-10-CM

## 2023-02-16 DIAGNOSIS — I700 Atherosclerosis of aorta: Secondary | ICD-10-CM | POA: Diagnosis not present

## 2023-02-16 DIAGNOSIS — I10 Essential (primary) hypertension: Secondary | ICD-10-CM | POA: Diagnosis present

## 2023-02-16 DIAGNOSIS — Z823 Family history of stroke: Secondary | ICD-10-CM | POA: Diagnosis not present

## 2023-02-16 DIAGNOSIS — E785 Hyperlipidemia, unspecified: Secondary | ICD-10-CM | POA: Diagnosis present

## 2023-02-16 DIAGNOSIS — D72829 Elevated white blood cell count, unspecified: Secondary | ICD-10-CM | POA: Insufficient documentation

## 2023-02-16 DIAGNOSIS — F0390 Unspecified dementia without behavioral disturbance: Secondary | ICD-10-CM | POA: Diagnosis present

## 2023-02-16 DIAGNOSIS — N390 Urinary tract infection, site not specified: Secondary | ICD-10-CM | POA: Diagnosis present

## 2023-02-16 DIAGNOSIS — Q670 Congenital facial asymmetry: Secondary | ICD-10-CM

## 2023-02-16 DIAGNOSIS — Z8673 Personal history of transient ischemic attack (TIA), and cerebral infarction without residual deficits: Secondary | ICD-10-CM

## 2023-02-16 DIAGNOSIS — R29706 NIHSS score 6: Secondary | ICD-10-CM | POA: Diagnosis present

## 2023-02-16 DIAGNOSIS — F1010 Alcohol abuse, uncomplicated: Secondary | ICD-10-CM | POA: Diagnosis present

## 2023-02-16 DIAGNOSIS — G9341 Metabolic encephalopathy: Secondary | ICD-10-CM | POA: Diagnosis present

## 2023-02-16 DIAGNOSIS — Z79899 Other long term (current) drug therapy: Secondary | ICD-10-CM | POA: Diagnosis not present

## 2023-02-16 DIAGNOSIS — E782 Mixed hyperlipidemia: Secondary | ICD-10-CM | POA: Diagnosis present

## 2023-02-16 DIAGNOSIS — I6359 Cerebral infarction due to unspecified occlusion or stenosis of other cerebral artery: Principal | ICD-10-CM | POA: Diagnosis present

## 2023-02-16 DIAGNOSIS — I6389 Other cerebral infarction: Secondary | ICD-10-CM | POA: Diagnosis not present

## 2023-02-16 DIAGNOSIS — I69391 Dysphagia following cerebral infarction: Secondary | ICD-10-CM

## 2023-02-16 DIAGNOSIS — T80818A Extravasation of other vesicant agent, initial encounter: Secondary | ICD-10-CM | POA: Diagnosis not present

## 2023-02-16 DIAGNOSIS — G319 Degenerative disease of nervous system, unspecified: Secondary | ICD-10-CM | POA: Diagnosis not present

## 2023-02-16 DIAGNOSIS — R4182 Altered mental status, unspecified: Principal | ICD-10-CM

## 2023-02-16 DIAGNOSIS — R471 Dysarthria and anarthria: Secondary | ICD-10-CM

## 2023-02-16 DIAGNOSIS — I6523 Occlusion and stenosis of bilateral carotid arteries: Secondary | ICD-10-CM | POA: Diagnosis not present

## 2023-02-16 DIAGNOSIS — E43 Unspecified severe protein-calorie malnutrition: Secondary | ICD-10-CM | POA: Diagnosis present

## 2023-02-16 DIAGNOSIS — I69331 Monoplegia of upper limb following cerebral infarction affecting right dominant side: Secondary | ICD-10-CM

## 2023-02-16 LAB — COMPREHENSIVE METABOLIC PANEL
ALT: 18 U/L (ref 0–44)
AST: 25 U/L (ref 15–41)
Albumin: 4 g/dL (ref 3.5–5.0)
Alkaline Phosphatase: 62 U/L (ref 38–126)
Anion gap: 7 (ref 5–15)
BUN: 25 mg/dL — ABNORMAL HIGH (ref 8–23)
CO2: 19 mmol/L — ABNORMAL LOW (ref 22–32)
Calcium: 9.7 mg/dL (ref 8.9–10.3)
Chloride: 108 mmol/L (ref 98–111)
Creatinine, Ser: 1.36 mg/dL — ABNORMAL HIGH (ref 0.61–1.24)
GFR, Estimated: 52 mL/min — ABNORMAL LOW (ref >60)
Glucose, Bld: 118 mg/dL — ABNORMAL HIGH (ref 70–99)
Potassium: 3.9 mmol/L (ref 3.5–5.1)
Sodium: 134 mmol/L — ABNORMAL LOW (ref 135–145)
Total Bilirubin: 0.3 mg/dL (ref 0.3–1.2)
Total Protein: 7.5 g/dL (ref 6.5–8.1)

## 2023-02-16 LAB — ETHANOL: Alcohol, Ethyl (B): <10 mg/dL (ref <10)

## 2023-02-16 LAB — URINALYSIS, W/ REFLEX TO CULTURE (INFECTION SUSPECTED)
Bilirubin Urine: NEGATIVE
Glucose, UA: NEGATIVE mg/dL
Hgb urine dipstick: NEGATIVE
Ketones, ur: NEGATIVE mg/dL
Nitrite: POSITIVE — AB
Protein, ur: 30 mg/dL — AB
Specific Gravity, Urine: 1.018 (ref 1.005–1.030)
WBC, UA: >50 WBC/hpf (ref 0–5)
pH: 5 (ref 5.0–8.0)

## 2023-02-16 LAB — LACTIC ACID, PLASMA
Lactic Acid, Venous: 1.1 mmol/L (ref 0.5–1.9)
Lactic Acid, Venous: 1.3 mmol/L (ref 0.5–1.9)

## 2023-02-16 LAB — CBC
HCT: 29.9 % — ABNORMAL LOW (ref 39.0–52.0)
Hemoglobin: 9.2 g/dL — ABNORMAL LOW (ref 13.0–17.0)
MCH: 27.4 pg (ref 26.0–34.0)
MCHC: 30.8 g/dL (ref 30.0–36.0)
MCV: 89 fL (ref 80.0–100.0)
Platelets: 280 10*3/uL (ref 150–400)
RBC: 3.36 MIL/uL — ABNORMAL LOW (ref 4.22–5.81)
RDW: 15.4 % (ref 11.5–15.5)
WBC: 16.9 10*3/uL — ABNORMAL HIGH (ref 4.0–10.5)
nRBC: 0 % (ref 0.0–0.2)

## 2023-02-16 LAB — PROCALCITONIN: Procalcitonin: <0.1 ng/mL

## 2023-02-16 MED ORDER — STROKE: EARLY STAGES OF RECOVERY BOOK: Freq: Once | Status: AC

## 2023-02-16 MED ORDER — VANCOMYCIN HCL 1750 MG/350ML IV SOLN
1750.0000 mg | Freq: Once | INTRAVENOUS | Status: AC
  Administered 2023-02-16: 1750 mg via INTRAVENOUS
  Filled 2023-02-16: qty 350

## 2023-02-16 MED ORDER — ACETAMINOPHEN 325 MG PO TABS: 650.0000 mg | ORAL_TABLET | ORAL | Status: DC | PRN

## 2023-02-16 MED ORDER — LACTATED RINGERS IV BOLUS
1000.0000 mL | Freq: Once | INTRAVENOUS | Status: AC
  Administered 2023-02-16: 1000 mL via INTRAVENOUS

## 2023-02-16 MED ORDER — ACETAMINOPHEN 160 MG/5ML PO SOLN: 650.0000 mg | ORAL | Status: DC | PRN

## 2023-02-16 MED ORDER — VANCOMYCIN HCL 750 MG/150ML IV SOLN
750.0000 mg | Freq: Two times a day (BID) | INTRAVENOUS | Status: DC
  Administered 2023-02-17 (×2): 750 mg via INTRAVENOUS
  Filled 2023-02-16 (×4): qty 150

## 2023-02-16 MED ORDER — HYDRALAZINE HCL 20 MG/ML IJ SOLN: 5.0000 mg | Freq: Three times a day (TID) | INTRAMUSCULAR | Status: AC | PRN

## 2023-02-16 MED ORDER — LEVETIRACETAM IN NACL 500 MG/100ML IV SOLN
500.0000 mg | Freq: Two times a day (BID) | INTRAVENOUS | Status: DC
  Administered 2023-02-17 – 2023-02-19 (×6): 500 mg via INTRAVENOUS
  Filled 2023-02-16 (×8): qty 100

## 2023-02-16 MED ORDER — PANTOPRAZOLE SODIUM 40 MG PO TBEC: 40.0000 mg | DELAYED_RELEASE_TABLET | Freq: Every day | ORAL | Status: DC

## 2023-02-16 MED ORDER — SENNOSIDES-DOCUSATE SODIUM 8.6-50 MG PO TABS: 1.0000 | ORAL_TABLET | Freq: Every evening | ORAL | Status: DC | PRN

## 2023-02-16 MED ORDER — HYDRALAZINE HCL 20 MG/ML IJ SOLN: 5.0000 mg | Freq: Three times a day (TID) | INTRAMUSCULAR | Status: DC | PRN

## 2023-02-16 MED ORDER — TRAZODONE HCL 50 MG PO TABS
100.0000 mg | ORAL_TABLET | Freq: Every day | ORAL | Status: DC
  Administered 2023-02-18: 100 mg via ORAL
  Filled 2023-02-16 (×5): qty 2

## 2023-02-16 MED ORDER — ACETAMINOPHEN 650 MG RE SUPP: 650.0000 mg | RECTAL | Status: DC | PRN

## 2023-02-16 MED ORDER — SODIUM CHLORIDE 0.9 % IV SOLN
2.0000 g | Freq: Two times a day (BID) | INTRAVENOUS | Status: DC
  Administered 2023-02-16 – 2023-02-18 (×4): 2 g via INTRAVENOUS
  Filled 2023-02-16: qty 2
  Filled 2023-02-16 (×2): qty 12.5
  Filled 2023-02-16: qty 2

## 2023-02-16 MED ORDER — APIXABAN 5 MG PO TABS: 5.0000 mg | ORAL_TABLET | Freq: Two times a day (BID) | ORAL | Status: DC

## 2023-02-16 MED ORDER — ATORVASTATIN CALCIUM 20 MG PO TABS
20.0000 mg | ORAL_TABLET | Freq: Every evening | ORAL | Status: DC
  Administered 2023-02-18 – 2023-02-20 (×3): 20 mg via ORAL
  Filled 2023-02-16 (×4): qty 1

## 2023-02-16 NOTE — Assessment & Plan Note (Addendum)
Etiology workup in progress next time, sepsis cannot be excluded at this time given altered mental status and leukocytosis Check lactic acid x 2, blood cultures x 2, procalcitonin LR 1 L bolus ordered, vancomycin, cefepime per pharmacy ordered

## 2023-02-16 NOTE — ED Notes (Signed)
Patient transported to CT 

## 2023-02-16 NOTE — Assessment & Plan Note (Addendum)
Altered mental status, possible new right-sided facial droop MRI of the brain ordered Fasting lipid and A1c ordered Permissive hypertension: Hydralazine 5 mg IV every 8 hours as needed for SBP greater than 190, 1 day ordered Frequent neuro vascular checks N.p.o. pending swallow study PT, OT, SLP Fall precaution and aspiration precaution

## 2023-02-16 NOTE — Assessment & Plan Note (Signed)
-   Eliquis 5 mg p.o. twice daily resumed 

## 2023-02-16 NOTE — ED Notes (Signed)
Encouraged pt to try and void, pt unable saying "I've got nothing."

## 2023-02-16 NOTE — Hospital Course (Signed)
Mr. Audre Vandine is a 82 year old male with history of hypertension, paroxysmal atrial fibrillation on Eliquis, hyperlipidemia mixed Alzheimer's and vascular dementia, 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  There was no note from prior stroke the patient had right-sided facial droop.  Temperature show 98.2, respiration rate of 18, heart rate 68, blood pressure 120/82, SpO2 98% on room air.  Serum sodium is 134, potassium 3.9, chloride 108, bicarb 19, BUN of 25, serum creatinine 1.386, nonfasting blood glucose 118, EGFR 50, WBC 16.9, hemoglobin 9.2, platelets of 280.  Lactic acid x 2 have been ordered pending collection.  CT of the head without contrast: Was read as no acute intracranial abnormality.  Old left frontal parietal lobe infarct and old left basal ganglia lacunar infarcts.  Moderate cerebral atrophy and chronic small vessel disease.  ED treatment: None

## 2023-02-16 NOTE — Assessment & Plan Note (Signed)
-   Atorvastatin 20 mg nightly resumed 

## 2023-02-16 NOTE — Assessment & Plan Note (Addendum)
Seizure precaution Check Keppra level, pending collection Home Keppra 500 mg twice daily p.o. not continued on admission and replaced with Keppra 500 mg IV twice daily

## 2023-02-16 NOTE — Assessment & Plan Note (Addendum)
Consult placed to registered dietitian 

## 2023-02-16 NOTE — H&P (Addendum)
History and Physical   Richard Carter [id number] DOB: January 26, 1941 DOA: 02/16/2023  PCP: Richard Body, MD  Outpatient Specialists: Dr. Leanna Carter clinic cardiology Patient coming from: Home via [place]  I have personally briefly reviewed patient's old medical records in [place].  Chief Concern: Altered mental status  HPI: [name] is a 82 year old male with history of hypertension, paroxysmal atrial fibrillation on Eliquis, hyperlipidemia mixed Alzheimer's and vascular dementia, 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  There was no note from prior stroke the patient had right-sided facial droop.  Temperature show 98.2, respiration rate of 18, heart rate 68, blood pressure 120/82, SpO2 98% on room air.  Serum sodium is 134, potassium 3.9, chloride 108, bicarb 19, BUN of 25, serum creatinine 1.386, nonfasting blood glucose 118, EGFR 50, WBC 16.9, hemoglobin 9.2, platelets of 280.  Lactic acid x 2 have been ordered pending collection.  CT of the head without contrast: Was read as no acute intracranial abnormality.  Old left frontal parietal lobe infarct and old left basal ganglia lacunar infarcts.  Moderate cerebral atrophy and chronic small vessel disease.  ED treatment: None ------------------------- At bedside patient was able to tell me his name, the location.  He was not able to tell me his age, current calendar year.  Richard Carter, his caregiver and healthcare power of attorney was at bedside.  Patient was not able to provide further information and had dysarthria.  Richard Carter noted that patient was more altered upon waking up with worsening weakness on day of admission.  This prompted patient to be taken to the ED.  Social history: Per caregiver, patient does not drink EtOH, smoke, use drugs.  ROS: Unable to complete as patient has dementia and  dysarthria  ED Course: Discussed with emergency medicine provider, patient requiring hospitalization for chief concerns of strokelike symptoms.  Assessment/Plan  Principal Problem:   Stroke-like symptom Active Problems:   AF (paroxysmal atrial fibrillation) (HCC)   Seizures (HCC)   HTN (hypertension)   Dementia (HCC)   HLD (hyperlipidemia)   GERD (gastroesophageal reflux disease)   History of stroke   Protein-calorie malnutrition, severe   Dysphagia   Leukocytosis   Assessment and Plan:  * Stroke-like symptom Altered mental status, possible new right-sided facial droop MRI of the brain ordered Fasting lipid and A1c ordered Permissive hypertension: Hydralazine 5 mg IV every 8 hours as needed for SBP greater than 190, 1 day ordered Frequent neuro vascular checks N.p.o. pending swallow study PT, OT, SLP Fall precaution and aspiration precaution  AF (paroxysmal atrial fibrillation) (HCC) Eliquis 5 mg p.o. twice daily resumed  Seizures (Richard Carter) Seizure precaution Check Keppra level, pending collection Home Keppra 500 mg twice daily p.o. not continued on admission and replaced with Keppra 500 mg IV twice daily  HTN (hypertension) Home antihypertensive medication not resumed on admission due to permissive hypertension Hydralazine 5 mg IV every 8 hours as needed for SBP greater 190, 1 day ordered AM team to resume home antihypertensive medication when the benefits outweigh the risk  Leukocytosis Etiology workup in progress next time, sepsis cannot be excluded at this time given altered mental status and leukocytosis Check lactic acid x 2, blood cultures x 2, procalcitonin LR 1 L bolus ordered, vancomycin, cefepime per pharmacy ordered  Dysphagia Aspiration precaution  Protein-calorie malnutrition, severe Consult placed to registered dietitian  History of stroke Fall precaution  GERD (gastroesophageal reflux  disease) PPI resumed  HLD (hyperlipidemia) Atorvastatin 20  mg nightly resumed  Chart reviewed.   DVT prophylaxis: Eliquis Code Status: DNR Diet: Pending SLP evaluation.  Patient failed nursing swallow screen at bedside. Family Communication: updated Richard Carter, who is the primary caregiver and healthcare power of attorney at bedside.  I also updated Richard Carter as Richard Carter' request Disposition Plan:  Consults called: Neurology by EDP Admission status: Telemetry medical, observation  Past Medical History:  Diagnosis Date   Alcohol abuse    Dementia ([place])    GERD (gastroesophageal reflux disease)    HLD (hyperlipidemia)    HTN (hypertension)    Seizures (Richard Carter)    Stroke Richard Carter)    Past Surgical History:  Procedure Laterality Date   skin cyst removal     Social History:  reports that he quit smoking about 14 months ago. His smoking use included cigarettes. He has a 34.00 pack-year smoking history. He has never used smokeless tobacco. He reports that he does not currently use alcohol. He reports that he does not use drugs.  No Known Allergies Family History  Problem Relation Age of Onset   Stroke Mother    Cancer Brother    Family history: Family history reviewed and not pertinent.  Prior to Admission medications   Medication Sig Start Date End Date Taking? Authorizing Provider  amLODipine (NORVASC) 5 MG tablet Take 1 tablet (5 mg total) by mouth daily. 05/12/21   Sreenath, Trula Slade, MD  apixaban (ELIQUIS) 5 MG TABS tablet Take 1 tablet (5 mg total) by mouth 2 (two) times daily. 05/29/22   Richard Boroughs, MD  atorvastatin (LIPITOR) 20 MG tablet Take 20 mg by mouth daily.    [provider]  food thickener (SIMPLYTHICK, NECTAR/LEVEL 2/MILDLY THICK,) GEL Take 1 packet by mouth as needed. 04/29/22   Swayze, Ava, DO  levETIRAcetam (KEPPRA) 500 MG tablet Take 1 tablet (500 mg total) by mouth 2 (two) times daily. 12/01/20   Danford, Suann Sly, MD  losartan (COZAAR) 50 MG tablet Take 50 mg by mouth daily. 02/25/22   [provider]   magnesium oxide (MAG-OX) 400 (240 Mg) MG tablet Take 1 tablet by mouth daily. 04/29/22   [provider]  metoprolol succinate (TOPROL-XL) 25 MG 24 hr tablet Take 0.5 tablets (12.5 mg total) by mouth daily. 04/30/22   Swayze, Ava, DO  ondansetron (ZOFRAN-ODT) 4 MG disintegrating tablet Take 1 tablet (4 mg total) by mouth every 8 (eight) hours as needed for nausea or vomiting. 08/24/22   Carrie Mew, MD  pantoprazole (PROTONIX) 40 MG tablet Take 1 tablet (40 mg total) by mouth 2 (two) times daily. After sixty days the patient is to continue protonix at 40 mg daily. 04/29/22 06/28/22  Swayze, Ava, DO  traZODone (DESYREL) 100 MG tablet Take 1 tablet (100 mg total) by mouth at bedtime. 05/11/21   Sidney Ace, MD   Physical Exam: Vitals:   02/16/23 1351 02/16/23 1500 02/16/23 1719  BP: 120/82    Pulse: 68 87   Resp: 18    Temp: 98.2 F (36.8 C)    SpO2: 98% 100%   Weight:   71 kg  Height:   5\' 6"  (1.676 m)   Constitutional: appears age-appropriate, frail, NAD, calm, comfortable Eyes: PERRL, lids and conjunctivae normal HENMT: Mucous membranes are moist. Posterior pharynx clear of any exudate or lesions.  Poor dentition.  Unable to accurately assess hearing.  Possible right-sided facial droop. Neck: normal, supple, no masses, no thyromegaly  Respiratory: clear to auscultation bilaterally, no wheezing, no crackles. Normal respiratory effort. No accessory muscle use.  Cardiovascular: Regular rate and rhythm, no murmurs / rubs / gallops. No extremity edema. 2+ pedal pulses. No carotid bruits.  Abdomen: no tenderness, no masses palpated, no hepatosplenomegaly. Bowel sounds positive.  Musculoskeletal: no clubbing / cyanosis. No joint deformity upper and lower extremities. Good ROM, no contractures, no atrophy. Normal muscle tone.  Skin: no rashes, lesions, ulcers. No induration Neurologic: Sensation intact. Strength 5/5 in all 4.  Dysarthria Psychiatric: Lacks judgment and insight  consistent with history of dementia. Alert and oriented x self and location.  Depressed mood.   EKG: independently reviewed, showing sinus rhythm with rate of 93, QTc 425  Chest x-ray on Admission: I personally reviewed and I agree with radiologist reading as below.  DG Chest Port 1 View  Result Date: 02/16/2023 CLINICAL DATA:  Altered mental status.  Hematuria, possible UTI. EXAM: PORTABLE CHEST 1 VIEW COMPARISON:  12/07/2022 FINDINGS: Atherosclerotic calcification of the aortic arch and descending thoracic aorta. Mild enlargement of the cardiopericardial silhouette, without edema. Low lung volumes are present, causing crowding of the pulmonary vasculature. Old healed bilateral rib fractures. Suspected small hiatal hernia. No blunting of the costophrenic angles. The lungs appear otherwise clear. IMPRESSION: 1. Mild enlargement of the cardiopericardial silhouette, without edema. 2. Low lung volumes are present, causing crowding of the pulmonary vasculature. 3. Old healed bilateral rib fractures. 4. Suspected small hiatal hernia. Electronically Signed   By: Van Clines M.D.   On: 02/16/2023 15:59   CT Head Wo Contrast  Result Date: 02/16/2023 CLINICAL DATA:  Altered mental status. EXAM: CT HEAD WITHOUT CONTRAST TECHNIQUE: Contiguous axial images were obtained from the base of the skull through the vertex without intravenous contrast. RADIATION DOSE REDUCTION: This exam was performed according to the departmental dose-optimization program which includes automated exposure control, adjustment of the mA and/or kV according to patient size and/or use of iterative reconstruction technique. COMPARISON:  06/27/2022 FINDINGS: Brain: No evidence of intracranial hemorrhage, acute infarction, hydrocephalus, extra-axial collection, or mass lesion/mass effect. Moderate cerebral atrophy and chronic small vessel disease are unchanged in appearance. Old left frontoparietal lobe infarct unchanged in appearance. Old  left basal ganglia lacunar infarcts again noted. Vascular:  No hyperdense vessel or other acute findings. Skull: No evidence of fracture or other significant bone abnormality. Sinuses/Orbits:  No acute findings. Other: None. IMPRESSION: No acute intracranial abnormality. Old left frontoparietal lobe infarct and old left basal ganglia lacunar infarcts. Moderate cerebral atrophy and chronic small vessel disease. Electronically Signed   By: Marlaine Hind M.D.   On: 02/16/2023 15:33    Labs on Admission: I have personally reviewed following labs  CBC: Recent Labs  Lab 02/16/23 1354  WBC 16.9*  HGB 9.2*  HCT 29.9*  MCV 89.0  PLT 123456   Basic Metabolic Panel: Recent Labs  Lab 02/16/23 1354  NA 134*  K 3.9  CL 108  CO2 19*  GLUCOSE 118*  BUN 25*  CREATININE 1.36*  CALCIUM 9.7   GFR: Estimated Creatinine Clearance: 37.1 mL/min (A) (by C-G formula based on SCr of 1.36 mg/dL (H)).  Liver Function Tests: Recent Labs  Lab 02/16/23 1354  AST 25  ALT 18  ALKPHOS 62  BILITOT 0.3  PROT 7.5  ALBUMIN 4.0   Urine analysis:    Component Value Date/Time   COLORURINE YELLOW (A) 08/24/2022 1418   APPEARANCEUR HAZY (A) 08/24/2022 1418   APPEARANCEUR Clear 06/12/2014 1029  LABSPEC 1.026 08/24/2022 1418   LABSPEC 1.005 06/12/2014 1029   PHURINE 6.0 08/24/2022 1418   GLUCOSEU NEGATIVE 08/24/2022 1418   GLUCOSEU Negative 06/12/2014 1029   HGBUR NEGATIVE 08/24/2022 1418   BILIRUBINUR NEGATIVE 08/24/2022 1418   BILIRUBINUR Negative 06/12/2014 1029   KETONESUR NEGATIVE 08/24/2022 1418   PROTEINUR NEGATIVE 08/24/2022 1418   NITRITE POSITIVE (A) 08/24/2022 1418   LEUKOCYTESUR LARGE (A) 08/24/2022 1418   LEUKOCYTESUR Negative 06/12/2014 1029   CRITICAL CARE Performed by: Dr. Tobie Poet  Total critical care time: 35 minutes  Critical care time was exclusive of separately billable procedures and treating other patients.  Critical care was necessary to treat or prevent imminent or  life-threatening deterioration.  Critical care was time spent personally by me on the following activities: development of treatment plan with patient and/or surrogate as well as nursing, discussions with consultants, evaluation of patient's response to treatment, examination of patient, obtaining history from patient or surrogate, ordering and performing treatments and interventions, ordering and review of laboratory studies, ordering and review of radiographic studies, pulse oximetry and re-evaluation of patient's condition.  This document was prepared using Dragon Voice Recognition software and may include unintentional dictation errors.  Dr. Tobie Poet [facility]  If 7PM-7AM, please contact overnight-coverage provider If 7AM-7PM, please contact day coverage provider [email or web address]  02/16/2023, 5:22 PM

## 2023-02-16 NOTE — Assessment & Plan Note (Signed)
-   PPI resumed °

## 2023-02-16 NOTE — ED Notes (Signed)
Patient transported to MRI 

## 2023-02-16 NOTE — Assessment & Plan Note (Signed)
Home antihypertensive medication not resumed on admission due to permissive hypertension Hydralazine 5 mg IV every 8 hours as needed for SBP greater 190, 1 day ordered AM team to resume home antihypertensive medication when the benefits outweigh the risk

## 2023-02-16 NOTE — ED Provider Notes (Signed)
[facility] Provider Note    Event Date/Time   First MD Initiated Contact with Patient 02/16/23 1514     (approximate)   History   Altered Mental Status   HPI  Richard Carter is a 82 y.o. male   Past medical history of A fibrillation on Eliquis, alcohol abuse, dementia, GERD, hypertension, hyperlipidemia, seizures on Keppra, prior stroke who presents to the emergency department because his caregiver [name] noted that this morning he had much worse blurred speech, worse right arm weakness, and seemed to be more confused than normal and difficult to wake up in his sleep.  He was in his regular state of health yesterday and had no recent illnesses, trauma, went to bed around 8:20 PM and when he was awoken by his caretaker this morning they noticed the above symptoms.  The patient has severe dysarthria but denies any acute medical complaints, pain, feels well.  I noticed that he has right upper extremity weakness as well as right-sided facial weakness and dysarthria.  No reported seizures or recent illnesses or trauma.  His caregiver [name] also noticed that he has had some blood in his urine and has a history of UTI.   Independent Historian contributed to assessment above: Both his sister who is at bedside and his caregiver [name] over the telephone  External Medical Documents Reviewed: Neurology consult note from August 2023 for suspected stroke and at that time his neurologic exam did show some right-sided arm weakness but no facial asymmetry      Physical Exam   Triage Vital Signs: ED Triage Vitals  Enc Vitals Group     BP 02/16/23 1351 120/82     Pulse Rate 02/16/23 1351 68     Resp 02/16/23 1351 18     Temp 02/16/23 1351 98.2 F (36.8 C)     Temp src --      SpO2 02/16/23 1351 98 %     Weight --      Height --      Head Circumference --      Peak Flow --      Pain Score 02/16/23 1352 0     Pain Loc --      Pain Edu? --      Excl. in [place]?  --     Most recent vital signs: Vitals:   02/16/23 1351 02/16/23 1500  BP: 120/82   Pulse: 68 87  Resp: 18   Temp: 98.2 F (36.8 C)   SpO2: 98% 100%    General: Awake, no distress.  CV:  Good peripheral perfusion.  Resp:  Normal effort.  Abd:  No distention.  Other:  He has right-sided facial weakness, right-sided upper extremity weakness but no sensory deficit.  Severe dysarthria which is not baseline per patient's sister who is at bedside.  I did not ambulate the patient.  His lungs are clear abdomen soft and nontender his vital signs are normal   ED Results / Procedures / Treatments   Labs (all labs ordered are listed, but only abnormal results are displayed) Labs Reviewed  COMPREHENSIVE METABOLIC PANEL - Abnormal; Notable for the following components:      Result Value   Sodium 134 (*)    CO2 19 (*)    Glucose, Bld 118 (*)    BUN 25 (*)    Creatinine, Ser 1.36 (*)    GFR, Estimated 52 (*)    All other components within normal limits  CBC -  Abnormal; Notable for the following components:   WBC 16.9 (*)    RBC 3.36 (*)    Hemoglobin 9.2 (*)    HCT 29.9 (*)    All other components within normal limits  URINALYSIS, W/ REFLEX TO CULTURE (INFECTION SUSPECTED)  LEVETIRACETAM LEVEL  ETHANOL  LACTIC ACID, PLASMA  LACTIC ACID, PLASMA  CBG MONITORING, ED     I ordered and reviewed the above labs they are notable for he has an elevated white blood cell count of 16.9 and hemoglobin of 9.2  EKG  ED ECG REPORT I, Lucillie Garfinkel, the attending physician, personally viewed and interpreted this ECG.   Date: 02/16/2023  EKG Time: 1358  Rate: 93  Rhythm: AF  Axis: nl  Intervals:none  ST&T Change: no stemi    RADIOLOGY I independently reviewed and interpreted CT scan of the head see no obvious bleeding or midline shift   PROCEDURES:  Critical Care performed: No  Procedures   MEDICATIONS ORDERED IN ED: Medications - No data to display  External physician /  consultants:  I spoke with hospitalist for admission and regarding care plan for this patient.   IMPRESSION / MDM / ASSESSMENT AND PLAN / ED COURSE  I reviewed the triage vital signs and the nursing notes.                                Patient's presentation is most consistent with acute presentation with potential threat to life or bodily function.  Differential diagnosis includes, but is not limited to, CVA, intracranial bleeding, intoxication, seizure and postictal state, electrolyte derangement, infection, urinary tract infection   The patient is on the cardiac monitor to evaluate for evidence of arrhythmia and/or significant heart rate changes.  MDM: This patient with new neurologic deficits but outside of the stroke window given last known normal was 820 last night as well as being on Eliquis.  He does have some new neurologic deficits including facial asymmetry on the right side, and old documented deficit of right-sided extremity weakness that I noticed today as well.  CT scan of the head shows no bleed.  He has had some hematuria as well and elevated white blood cell count I will check a urinalysis for infection today.  Will check his Keppra level.  Admit for stroke workup, AMS evaluation.         FINAL CLINICAL IMPRESSION(S) / ED DIAGNOSES   Final diagnoses:  Altered mental status, unspecified altered mental status type  Hematuria, unspecified type  Dysarthria  Facial asymmetry     Rx / DC Orders   ED Discharge Orders     None        Note:  This document was prepared using Dragon voice recognition software and may include unintentional dictation errors.    Lucillie Garfinkel, MD 02/16/23 [phone]

## 2023-02-16 NOTE — Assessment & Plan Note (Signed)
-   Fall precaution 

## 2023-02-16 NOTE — Assessment & Plan Note (Signed)
Aspiration precaution

## 2023-02-16 NOTE — Progress Notes (Signed)
Pharmacy Antibiotic Note  Richard Carter is a 82 y.o. male admitted on 02/16/2023 with sepsis.  Pharmacy has been consulted for vancomycin and cefepime dosing.  Scr slightly elevated from baseline at 1.36 (baseline 1.00)  Plan: Vancomycin 1750 mg x 1 Vancomycin 750 mg every 24 hours Goal AUC 400-600 Used Vd 0.72, IBW, Scr 1.36 Estimated AUC 416.5, Cmin 11.4  Cefepime 2 grams every 12 hours  Follow up renal function in AM    Temp (24hrs), Avg:98.2 F (36.8 C), Min:98.2 F (36.8 C), Max:98.2 F (36.8 C)  Recent Labs  Lab 02/16/23 1354  WBC 16.9*  CREATININE 1.36*    CrCl cannot be calculated (Unknown ideal weight.).    No Known Allergies  Antimicrobials this admission: vancomycin 3/24 >>  cefepime 3/24 >>   Dose adjustments this admission: N/a  Microbiology results: 3/24 BCx: to be collected 3.24 UA: to be collected  Thank you for allowing pharmacy to be a part of this patient's care.  Glean Salvo, PharmD, BCPS Clinical Pharmacist  02/16/2023 4:55 PM

## 2023-02-16 NOTE — ED Triage Notes (Signed)
Pt to ED via POV from home. Friend reports pt was not acting like himself this morning. Family reports pt has blood in his urine and concerned for possible UTI. Pt has hx of stroke and is on Eliquis. Friend also reports speech changes for the past few days and has been referred to speech therapy.

## 2023-02-16 NOTE — Consult Note (Signed)
NEURO HOSPITALIST CONSULT NOTE   Requesting physician: Dr. Tobie Poet  Reason for Consult: Worsened dysarthria   History obtained from:  Caregiver and Chart     HPI:                                                                                                                                          Richard Carter is an 82 y.o. male with a PMHx of alcohol abuse, demenita, GERD, HLD, HTN, seizures (on Keppra 500 mg BID), strokes and atrial fibrillation (on Eliquis 5 mg BID) who presents from home to the ED this afternoon via POV after his caregiver noted that he had not been acting like himself this morning. Caregiver notes the for the past few days the patient had exhibited speech changes for which he has been referred to Speech Therapy. Caregiver also endorsed blood in his urine concerning for possible UTI.   When reinterviewed by EDP, caregiver summarized the new deficts as follows: Worsened slurred speech, worsened right arm weakness, more confused than normal and difficult to wake from sleep. Exam by EDP noted right upper extremity weakness as well as right-sided facial weakness and severe dysarthria. Sister at bedside confirmed that the dysarthria is now significantly worse than the patient's baseline.   If not including the speech changes over the past few days, LKN was yesterday at about 8:20 PM prior to going to sleep.   He has had no recent trauma or evidence for infection except for the hematuria noted above. Also with no recent seizure activity.   ED course: - CT head revealed no acute hemorrhage. Old ischemic infarctions were noted.  - Labs revealed elevated WBC .  - Keppra level was ordered   Past Medical History:  Diagnosis Date   Alcohol abuse    Dementia (HCC)    GERD (gastroesophageal reflux disease)    HLD (hyperlipidemia)    HTN (hypertension)    Seizures (HCC)    Stroke [facility])     Past Surgical History:  Procedure Laterality Date   skin cyst  removal      Family History  Problem Relation Age of Onset   Stroke Mother    Cancer Brother             Social History:  reports that he quit smoking about 14 months ago. His smoking use included cigarettes. He has a 34.00 pack-year smoking history. He has never used smokeless tobacco. He reports that he does not currently use alcohol. He reports that he does not use drugs.  No Known Allergies  MEDICATIONS:  No current facility-administered medications on file prior to encounter.   Current Outpatient Medications on File Prior to Encounter  Medication Sig Dispense Refill   amLODipine (NORVASC) 5 MG tablet Take 1 tablet (5 mg total) by mouth daily.     apixaban (ELIQUIS) 5 MG TABS tablet Take 1 tablet (5 mg total) by mouth 2 (two) times daily. 60 tablet 0   atorvastatin (LIPITOR) 20 MG tablet Take 20 mg by mouth daily.     food thickener (SIMPLYTHICK, NECTAR/LEVEL 2/MILDLY THICK,) GEL Take 1 packet by mouth as needed. 100 packet 0   levETIRAcetam (KEPPRA) 500 MG tablet Take 1 tablet (500 mg total) by mouth 2 (two) times daily. 60 tablet 3   losartan (COZAAR) 50 MG tablet Take 50 mg by mouth daily.     magnesium oxide (MAG-OX) 400 (240 Mg) MG tablet Take 1 tablet by mouth daily.     metoprolol succinate (TOPROL-XL) 25 MG 24 hr tablet Take 0.5 tablets (12.5 mg total) by mouth daily. 15 tablet 0   ondansetron (ZOFRAN-ODT) 4 MG disintegrating tablet Take 1 tablet (4 mg total) by mouth every 8 (eight) hours as needed for nausea or vomiting. 20 tablet 0   pantoprazole (PROTONIX) 40 MG tablet Take 1 tablet (40 mg total) by mouth 2 (two) times daily. After sixty days the patient is to continue protonix at 40 mg daily. 120 tablet 0   traZODone (DESYREL) 100 MG tablet Take 1 tablet (100 mg total) by mouth at bedtime.       ROS:                                                                                                                                        Unable to obtain due to AMS.    Blood pressure 120/82, pulse 87, temperature 98.2 F (36.8 C), resp. rate 18, SpO2 100 %.   General Examination:                                                                                                       Physical Exam HEENT-  [place]/AT  //*** Lungs- Respirations unlabored Extremities- Warm and well-perfused  Neurological Examination Mental Status: Awake and alert. Fully oriented. Thought content appropriate.  Speech fluent without evidence of aphasia.  Able to follow all commands without difficulty. Cranial Nerves: II: Temporal visual fields intact with no extinction to DSS. PERRL. III,IV, VI: No ptosis. EOMI. No nystagmus. V: Temp sensation equal bilaterally VII: Smile symmetric  VIII: Hearing intact to voice IX,X: No hypophonia or hoarseness XI: Symmetric XII: Midline tongue extension Motor: RUE: 5/5 RLE: 5/5 LUE: 5/5 LLE: 5/5 Sensory: Temp and FT intact x 4. No extinction to DSS. Deep Tendon Reflexes: 2+ and symmetric bilateral biceps, brachioradialis and patellae Plantars: Right: downgoingLeft: downgoing Cerebellar: No ataxia with FNF bilaterally Gait: Deferred    Lab Results: Basic Metabolic Panel: Recent Labs  Lab 02/16/23 1354  NA 134*  K 3.9  CL 108  CO2 19*  GLUCOSE 118*  BUN 25*  CREATININE 1.36*  CALCIUM 9.7    CBC: Recent Labs  Lab 02/16/23 1354  WBC 16.9*  HGB 9.2*  HCT 29.9*  MCV 89.0  PLT 280    Cardiac Enzymes: No results for input(s): "CKTOTAL", "CKMB", "CKMBINDEX", "TROPONINI" in the last 168 hours.  Lipid Panel: No results for input(s): "CHOL", "TRIG", "HDL", "CHOLHDL", "VLDL", "LDLCALC" in the last 168 hours.  Imaging: DG Chest Port 1 View  Result Date: 02/16/2023 CLINICAL DATA:  Altered mental status.  Hematuria, possible UTI. EXAM: PORTABLE CHEST 1 VIEW COMPARISON:  12/07/2022 FINDINGS: Atherosclerotic  calcification of the aortic arch and descending thoracic aorta. Mild enlargement of the cardiopericardial silhouette, without edema. Low lung volumes are present, causing crowding of the pulmonary vasculature. Old healed bilateral rib fractures. Suspected small hiatal hernia. No blunting of the costophrenic angles. The lungs appear otherwise clear. IMPRESSION: 1. Mild enlargement of the cardiopericardial silhouette, without edema. 2. Low lung volumes are present, causing crowding of the pulmonary vasculature. 3. Old healed bilateral rib fractures. 4. Suspected small hiatal hernia. Electronically Signed   By: Van Clines M.D.   On: 02/16/2023 15:59   CT Head Wo Contrast  Result Date: 02/16/2023 CLINICAL DATA:  Altered mental status. EXAM: CT HEAD WITHOUT CONTRAST TECHNIQUE: Contiguous axial images were obtained from the base of the skull through the vertex without intravenous contrast. RADIATION DOSE REDUCTION: This exam was performed according to the departmental dose-optimization program which includes automated exposure control, adjustment of the mA and/or kV according to patient size and/or use of iterative reconstruction technique. COMPARISON:  06/27/2022 FINDINGS: Brain: No evidence of intracranial hemorrhage, acute infarction, hydrocephalus, extra-axial collection, or mass lesion/mass effect. Moderate cerebral atrophy and chronic small vessel disease are unchanged in appearance. Old left frontoparietal lobe infarct unchanged in appearance. Old left basal ganglia lacunar infarcts again noted. Vascular:  No hyperdense vessel or other acute findings. Skull: No evidence of fracture or other significant bone abnormality. Sinuses/Orbits:  No acute findings. Other: None. IMPRESSION: No acute intracranial abnormality. Old left frontoparietal lobe infarct and old left basal ganglia lacunar infarcts. Moderate cerebral atrophy and chronic small vessel disease. Electronically Signed   By: Marlaine Hind M.D.    On: 02/16/2023 15:33     Assessment:  - Exam reveals *** - CT head:  No acute intracranial abnormality. Old left frontoparietal lobe infarct and old left basal ganglia lacunar infarcts. Moderate cerebral atrophy and chronic small vessel disease.   Recommendations: -   Electronically signed: Dr. Kerney Elbe 02/16/2023, 4:19 PM

## 2023-02-17 ENCOUNTER — Observation Stay
Admit: 2023-02-17 | Discharge: 2023-02-17 | Disposition: A | Discharge status: Home / Self Care | Payer: Medicare HMO | Attending: Internal Medicine | Admitting: Internal Medicine

## 2023-02-17 ENCOUNTER — Ambulatory Visit: Payer: Medicare HMO

## 2023-02-17 DIAGNOSIS — R319 Hematuria, unspecified: Secondary | ICD-10-CM | POA: Diagnosis present

## 2023-02-17 DIAGNOSIS — I639 Cerebral infarction, unspecified: Secondary | ICD-10-CM | POA: Diagnosis present

## 2023-02-17 DIAGNOSIS — Z66 Do not resuscitate: Secondary | ICD-10-CM | POA: Diagnosis present

## 2023-02-17 DIAGNOSIS — I6389 Other cerebral infarction: Secondary | ICD-10-CM | POA: Diagnosis not present

## 2023-02-17 DIAGNOSIS — I6522 Occlusion and stenosis of left carotid artery: Secondary | ICD-10-CM | POA: Diagnosis present

## 2023-02-17 DIAGNOSIS — R4182 Altered mental status, unspecified: Secondary | ICD-10-CM | POA: Diagnosis not present

## 2023-02-17 DIAGNOSIS — Z87891 Personal history of nicotine dependence: Secondary | ICD-10-CM | POA: Diagnosis not present

## 2023-02-17 DIAGNOSIS — N390 Urinary tract infection, site not specified: Secondary | ICD-10-CM | POA: Diagnosis present

## 2023-02-17 DIAGNOSIS — R471 Dysarthria and anarthria: Secondary | ICD-10-CM | POA: Diagnosis present

## 2023-02-17 DIAGNOSIS — F0280 Dementia in other diseases classified elsewhere without behavioral disturbance: Secondary | ICD-10-CM | POA: Diagnosis present

## 2023-02-17 DIAGNOSIS — I6359 Cerebral infarction due to unspecified occlusion or stenosis of other cerebral artery: Secondary | ICD-10-CM | POA: Diagnosis present

## 2023-02-17 DIAGNOSIS — I10 Essential (primary) hypertension: Secondary | ICD-10-CM | POA: Diagnosis present

## 2023-02-17 DIAGNOSIS — R1310 Dysphagia, unspecified: Secondary | ICD-10-CM | POA: Diagnosis present

## 2023-02-17 DIAGNOSIS — I69331 Monoplegia of upper limb following cerebral infarction affecting right dominant side: Secondary | ICD-10-CM | POA: Diagnosis not present

## 2023-02-17 DIAGNOSIS — G309 Alzheimer's disease, unspecified: Secondary | ICD-10-CM | POA: Diagnosis present

## 2023-02-17 DIAGNOSIS — G9341 Metabolic encephalopathy: Secondary | ICD-10-CM | POA: Diagnosis present

## 2023-02-17 DIAGNOSIS — R29810 Facial weakness: Secondary | ICD-10-CM | POA: Diagnosis present

## 2023-02-17 DIAGNOSIS — I480 Paroxysmal atrial fibrillation: Secondary | ICD-10-CM | POA: Diagnosis present

## 2023-02-17 DIAGNOSIS — R569 Unspecified convulsions: Secondary | ICD-10-CM | POA: Diagnosis present

## 2023-02-17 DIAGNOSIS — Z7901 Long term (current) use of anticoagulants: Secondary | ICD-10-CM | POA: Diagnosis not present

## 2023-02-17 DIAGNOSIS — F0150 Vascular dementia without behavioral disturbance: Secondary | ICD-10-CM | POA: Diagnosis present

## 2023-02-17 DIAGNOSIS — F1010 Alcohol abuse, uncomplicated: Secondary | ICD-10-CM | POA: Diagnosis present

## 2023-02-17 DIAGNOSIS — R2990 Unspecified symptoms and signs involving the nervous system: Secondary | ICD-10-CM | POA: Diagnosis not present

## 2023-02-17 DIAGNOSIS — K219 Gastro-esophageal reflux disease without esophagitis: Secondary | ICD-10-CM | POA: Diagnosis present

## 2023-02-17 DIAGNOSIS — T80818A Extravasation of other vesicant agent, initial encounter: Secondary | ICD-10-CM | POA: Diagnosis not present

## 2023-02-17 DIAGNOSIS — Z79899 Other long term (current) drug therapy: Secondary | ICD-10-CM | POA: Diagnosis not present

## 2023-02-17 DIAGNOSIS — E782 Mixed hyperlipidemia: Secondary | ICD-10-CM | POA: Diagnosis present

## 2023-02-17 DIAGNOSIS — I6523 Occlusion and stenosis of bilateral carotid arteries: Secondary | ICD-10-CM | POA: Diagnosis not present

## 2023-02-17 DIAGNOSIS — Z823 Family history of stroke: Secondary | ICD-10-CM | POA: Diagnosis not present

## 2023-02-17 DIAGNOSIS — I6503 Occlusion and stenosis of bilateral vertebral arteries: Secondary | ICD-10-CM | POA: Diagnosis not present

## 2023-02-17 LAB — LIPID PANEL
Cholesterol: 116 mg/dL (ref 0–200)
HDL: 42 mg/dL (ref >40)
LDL Cholesterol: 56 mg/dL (ref 0–99)
Total CHOL/HDL Ratio: 2.8 RATIO
Triglycerides: 92 mg/dL (ref <150)
VLDL: 18 mg/dL (ref 0–40)

## 2023-02-17 LAB — URINE CULTURE: Culture: NO GROWTH

## 2023-02-17 LAB — CREATININE, SERUM
Creatinine, Ser: 1.17 mg/dL (ref 0.61–1.24)
GFR, Estimated: >60 mL/min (ref >60)

## 2023-02-17 MED ORDER — PANTOPRAZOLE SODIUM 40 MG IV SOLR
40.0000 mg | Freq: Every day | INTRAVENOUS | Status: DC
  Administered 2023-02-17 – 2023-02-19 (×3): 40 mg via INTRAVENOUS
  Filled 2023-02-17 (×3): qty 10

## 2023-02-17 MED ORDER — ENOXAPARIN SODIUM 40 MG/0.4ML IJ SOSY
40.0000 mg | PREFILLED_SYRINGE | INTRAMUSCULAR | Status: DC
  Administered 2023-02-17: 40 mg via SUBCUTANEOUS
  Filled 2023-02-17: qty 0.4

## 2023-02-17 MED ORDER — ENOXAPARIN SODIUM 80 MG/0.8ML IJ SOSY
1.0000 mg/kg | PREFILLED_SYRINGE | Freq: Two times a day (BID) | INTRAMUSCULAR | Status: DC
  Administered 2023-02-17 – 2023-02-19 (×4): 70 mg via SUBCUTANEOUS
  Filled 2023-02-17 (×5): qty 0.7

## 2023-02-17 NOTE — Consult Note (Incomplete)
NEURO HOSPITALIST CONSULT NOTE   Requesting physician: Dr. Tobie Poet  Reason for Consult: Worsened dysarthria   History obtained from:  Caregiver and Chart     HPI:                                                                                                                                          Richard Carter is an 82 y.o. male with a PMHx of alcohol abuse, demenita, GERD, HLD, HTN, seizures (on Keppra 500 mg BID), strokes and atrial fibrillation (on Eliquis 5 mg BID) who presents from home to the ED this afternoon via POV after his caregiver noted that he had not been acting like himself this morning. Caregiver notes the for the past few days the patient had exhibited speech changes for which he has been referred to Speech Therapy. Caregiver also endorsed blood in his urine concerning for possible UTI.   When reinterviewed by EDP, caregiver summarized the new deficts as follows: Worsened slurred speech, worsened right arm weakness, more confused than normal and difficult to wake from sleep. Exam by EDP noted right upper extremity weakness as well as right-sided facial weakness and severe dysarthria. Sister at bedside confirmed that the dysarthria is now significantly worse than the patient's baseline.   If not including the speech changes over the past few days, LKN was yesterday at about 8:20 PM prior to going to sleep.   He has had no recent trauma or evidence for infection except for the hematuria noted above. Also with no recent seizure activity.   ED course: - CT head revealed no acute hemorrhage. Old ischemic infarctions were noted.  - Labs revealed elevated WBC .  - Keppra level was ordered   Past Medical History:  Diagnosis Date  . Alcohol abuse   . Dementia ([place])   . GERD (gastroesophageal reflux disease)   . HLD (hyperlipidemia)   . HTN (hypertension)   . Seizures ([place])   . Stroke [facility])     Past Surgical History:  Procedure Laterality Date  .  skin cyst removal      Family History  Problem Relation Age of Onset  . Stroke Mother   . Cancer Brother             Social History:  reports that he quit smoking about 14 months ago. His smoking use included cigarettes. He has a 34.00 pack-year smoking history. He has never used smokeless tobacco. He reports that he does not currently use alcohol. He reports that he does not use drugs.  No Known Allergies  MEDICATIONS:  No current facility-administered medications on file prior to encounter.   Current Outpatient Medications on File Prior to Encounter  Medication Sig Dispense Refill  . amLODipine (NORVASC) 5 MG tablet Take 1 tablet (5 mg total) by mouth daily.    Marland Kitchen apixaban (ELIQUIS) 5 MG TABS tablet Take 1 tablet (5 mg total) by mouth 2 (two) times daily. 60 tablet 0  . atorvastatin (LIPITOR) 20 MG tablet Take 20 mg by mouth daily.    . food thickener (SIMPLYTHICK, NECTAR/LEVEL 2/MILDLY THICK,) GEL Take 1 packet by mouth as needed. 100 packet 0  . levETIRAcetam (KEPPRA) 500 MG tablet Take 1 tablet (500 mg total) by mouth 2 (two) times daily. 60 tablet 3  . losartan (COZAAR) 50 MG tablet Take 50 mg by mouth daily.    . magnesium oxide (MAG-OX) 400 (240 Mg) MG tablet Take 1 tablet by mouth daily.    . metoprolol succinate (TOPROL-XL) 25 MG 24 hr tablet Take 0.5 tablets (12.5 mg total) by mouth daily. 15 tablet 0  . ondansetron (ZOFRAN-ODT) 4 MG disintegrating tablet Take 1 tablet (4 mg total) by mouth every 8 (eight) hours as needed for nausea or vomiting. 20 tablet 0  . pantoprazole (PROTONIX) 40 MG tablet Take 1 tablet (40 mg total) by mouth 2 (two) times daily. After sixty days the patient is to continue protonix at 40 mg daily. 120 tablet 0  . traZODone (DESYREL) 100 MG tablet Take 1 tablet (100 mg total) by mouth at bedtime.       ROS:                                                                                                                                        Unable to obtain due to AMS.    Blood pressure 120/82, pulse 87, temperature 98.2 F (36.8 C), resp. rate 18, SpO2 100 %.   General Examination:                                                                                                       Physical Exam HEENT-  [place]/AT  //*** Lungs- Respirations unlabored Extremities- Warm and well-perfused  Neurological Examination Mental Status: Awake and alert. Fully oriented. Thought content appropriate.  Speech fluent without evidence of aphasia.  Able to follow all commands without difficulty. Cranial Nerves: II: Temporal visual fields intact with no extinction to DSS. PERRL. III,IV, VI: No ptosis. EOMI. No nystagmus. V: Temp sensation equal bilaterally VII: Smile symmetric  VIII: Hearing intact to voice IX,X: No hypophonia or hoarseness XI: Symmetric XII: Midline tongue extension Motor: RUE: 5/5 RLE: 5/5 LUE: 5/5 LLE: 5/5 Sensory: Temp and FT intact x 4. No extinction to DSS. Deep Tendon Reflexes: 2+ and symmetric bilateral biceps, brachioradialis and patellae Plantars: Right: downgoingLeft: downgoing Cerebellar: No ataxia with FNF bilaterally Gait: Deferred    Lab Results: Basic Metabolic Panel: Recent Labs  Lab 02/16/23 1354  NA 134*  K 3.9  CL 108  CO2 19*  GLUCOSE 118*  BUN 25*  CREATININE 1.36*  CALCIUM 9.7    CBC: Recent Labs  Lab 02/16/23 1354  WBC 16.9*  HGB 9.2*  HCT 29.9*  MCV 89.0  PLT 280    Cardiac Enzymes: No results for input(s): "CKTOTAL", "CKMB", "CKMBINDEX", "TROPONINI" in the last 168 hours.  Lipid Panel: No results for input(s): "CHOL", "TRIG", "HDL", "CHOLHDL", "VLDL", "LDLCALC" in the last 168 hours.  Imaging: DG Chest Port 1 View  Result Date: 02/16/2023 CLINICAL DATA:  Altered mental status.  Hematuria, possible UTI. EXAM: PORTABLE CHEST 1 VIEW COMPARISON:  12/07/2022  FINDINGS: Atherosclerotic calcification of the aortic arch and descending thoracic aorta. Mild enlargement of the cardiopericardial silhouette, without edema. Low lung volumes are present, causing crowding of the pulmonary vasculature. Old healed bilateral rib fractures. Suspected small hiatal hernia. No blunting of the costophrenic angles. The lungs appear otherwise clear. IMPRESSION: 1. Mild enlargement of the cardiopericardial silhouette, without edema. 2. Low lung volumes are present, causing crowding of the pulmonary vasculature. 3. Old healed bilateral rib fractures. 4. Suspected small hiatal hernia. Electronically Signed   By: Van Clines M.D.   On: 02/16/2023 15:59   CT Head Wo Contrast  Result Date: 02/16/2023 CLINICAL DATA:  Altered mental status. EXAM: CT HEAD WITHOUT CONTRAST TECHNIQUE: Contiguous axial images were obtained from the base of the skull through the vertex without intravenous contrast. RADIATION DOSE REDUCTION: This exam was performed according to the departmental dose-optimization program which includes automated exposure control, adjustment of the mA and/or kV according to patient size and/or use of iterative reconstruction technique. COMPARISON:  06/27/2022 FINDINGS: Brain: No evidence of intracranial hemorrhage, acute infarction, hydrocephalus, extra-axial collection, or mass lesion/mass effect. Moderate cerebral atrophy and chronic small vessel disease are unchanged in appearance. Old left frontoparietal lobe infarct unchanged in appearance. Old left basal ganglia lacunar infarcts again noted. Vascular:  No hyperdense vessel or other acute findings. Skull: No evidence of fracture or other significant bone abnormality. Sinuses/Orbits:  No acute findings. Other: None. IMPRESSION: No acute intracranial abnormality. Old left frontoparietal lobe infarct and old left basal ganglia lacunar infarcts. Moderate cerebral atrophy and chronic small vessel disease. Electronically Signed    By: Marlaine Hind M.D.   On: 02/16/2023 15:33      Assessment: 82 y.o. male with a PMHx of alcohol abuse, demenita, GERD, HLD, HTN, seizures (on Keppra 500 mg BID), strokes and atrial fibrillation (on Eliquis 5 mg BID) who presents from home to the ED this afternoon via POV after his caregiver noted that he had not been acting like himself this morning. Caregiver notes the for the past few days the patient had exhibited speech changes for which he has been referred to Speech Therapy. Caregiver also endorsed blood in his urine concerning for possible UTI. When reinterviewed by EDP, caregiver summarized the new deficts as follows: Worsened slurred speech, worsened right arm weakness, more confused than normal and difficult to wake from sleep. Exam by EDP noted right upper extremity weakness  as well as right-sided facial weakness and severe dysarthria. Sister at bedside confirmed that the dysarthria is now significantly worse than the patient's baseline. If not including the speech changes over the past few days, LKN was yesterday at about 8:20 PM prior to going to sleep. He has had no recent trauma or evidence for infection except for the hematuria noted above. Also with no recent seizure activity.  - Exam reveals findings that best localize to the left MCA territory. DDx includes basal ganglia, thalamic, internal capsule or cortically based infarction on the left.  - Was out of the TNK time window on presentation.  - Given the prior speech changes over > 24 hours prior to presentation and findings not consistent with LVO, the patient was not a candidate for thrombectomy.  - Stroke risk factors as above.  - CT head: No acute intracranial abnormality. Old left frontoparietal lobe infarct and old left basal ganglia lacunar infarcts. Moderate cerebral atrophy and chronic small vessel disease.  - MRI brain: Small acute infarcts in the cortex of the anterior left frontal lobe, posterior left frontal lobe, and  left parietal lobe. - Labs::  - Elevated WBC. Elevated BUN of 134.  - Keppra level is pending - DDx for presentationincludes    Recommendations: -   Electronically signed: Dr. Kerney Elbe 02/16/2023, 4:19 PM

## 2023-02-17 NOTE — Progress Notes (Signed)
Progress Note   Patient: Richard Carter [id number] DOB: [birth year] DOA: 02/16/2023     0 DOS: the patient was seen and examined on 02/17/2023     Subjective:  Patient seen and examined at bedside this morning in the presence of speech therapist Still having right-sided facial weakness. Denies nausea vomiting and abdominal pain chest pain    Brief hospital course: Mr. Bradley Lees is a 82 year old male with history of hypertension, paroxysmal atrial fibrillation on Eliquis, hyperlipidemia mixed Alzheimer's and vascular dementia, 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 MRI of the brain showed findings of Small acute infarcts in the cortex of the anterior left frontal lobe, posterior left frontal lobe, and left parietal lobe.  Assessment and Plan:  Acute ischemic stroke Acute metabolic encephalopathy secondary to above Patient has new right-sided facial droop MRI of the brain showed findings of acute stroke Follow-up fasting lipid and A1c ordered Permissive hypertension: Hydralazine 5 mg IV every 8 hours as needed for SBP greater than 220, for 24 hours Frequent neuro vascular checks To start aspirin and statin therapy if patient cleared by speech therapist N.p.o. speech therapy  Continue PT, OT, SLP Fall precaution and aspiration precaution   AF (paroxysmal atrial fibrillation) (HCC) Eliquis 5 mg p.o. currently on hold, will hold patient on Lovenox given n.p.o. status   Seizures ([place]) Seizure precaution Follow-up with Keppra levels Home Keppra 500 mg twice daily p.o. not continued on admission and replaced with Keppra 500 mg IV twice daily   HTN (hypertension) Management as above Will resume home medication after permissive hypertension duration   Leukocytosis due to UTI Continue current antibiotics De-escalate antibiotics based on culture results     Dysphagia Aspiration precaution ST on board   Protein-calorie malnutrition, severe Consult placed to registered dietitian   History of stroke Fall precaution   GERD (gastroesophageal reflux disease) PPI resumed   HLD (hyperlipidemia) Atorvastatin 20 mg nightly resumed after NPO status   Chart reviewed.    DVT prophylaxis: Lovenox Code Status: DNR Diet: Pending SLP evaluation.   Family Communication: updated [name], who is the primary caregiver and healthcare power of attorney at bedside.  Disposition Plan:  Consults called: Neurology by EDP   Physical Exam: Vitals:   02/16/23 2057 02/17/23 0025 02/17/23 0510 02/17/23 0818  BP: 138/68 116/81 (!) 142/66 (!) 160/70  Pulse: 68 76 60 64  Resp: 18 18 17 16   Temp: (!) 97.5 F (36.4 C)  97.8 F (36.6 C) 97.7 F (36.5 C)  TempSrc: Oral  Oral   SpO2: 95% 99% 100% 98%  Weight:      Height:       Constitutional: appears age-appropriate, frail, NAD, calm, comfortable Eyes: PERRL, lids and conjunctivae normal HENMT: Has right-sided facial droop. Neck: normal, supple, no masses, no thyromegaly Respiratory: clear to auscultation bilaterally, no wheezing, Cardiovascular: Regular rate and rhythm, no murmurs / rubs / gallops.  Abdomen: no tenderness,  Musculoskeletal: no clubbing /contracture deformity of the left middle finger Skin: no rashes, lesions, ulcers. No induration Neurologic: Sensation intact. Strength 5/5 in all 4.  Dysarthria Psychiatric:  Depressed mood.    Data Reviewed: I reviewed the patient's MRI results showing evidence of acute infarct Urinalysis also showing UTI  Family Communication: Discussed with patient's sister and patient's son present at bedside  Disposition: Patient meets inpatient criteria given her acute stroke   Planned Discharge Destination: Pending clinical course  after evaluation by PT OT   Time spent: 45 minutes  Author: Verline Lema, MD 02/17/2023 12:10 PM  For on call review  [email or web address].

## 2023-02-17 NOTE — Evaluation (Signed)
Physical Therapy Evaluation Patient Details Name: Richard Carter MRN: [id number] DOB: Oct 01, 1941 Today's Date: 02/17/2023  History of Present Illness  Pt is an 82 year old male with history of hypertension, paroxysmal atrial fibrillation on Eliquis, hyperlipidemia mixed Alzheimer's and vascular dementia, 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 MD assessment includes: Stroke-like symptom with MR impression "small acute infarcts in the cortex of the anterior left frontal lobe, posterior left frontal lobe, and left parietal lobe", seizures, HTN, leukocytosis, and severe protein-calorie malnutrition.   Clinical Impression  Pt was pleasant and motivated to participate during the session and put forth good effort throughout. Pt required no physical assistance during the session but did require extra time and cuing with functional tasks.  Pt was able to stand and ambulate 50 feet with a RW with no overt LOB but did require heavy lean/support from the RW with frequent cuing for proper sequencing.  Per caregiver in room pt minimally weaker with functional tasks than at baseline and will benefit from continued PT services upon discharge to safely address deficits listed in patient problem list for decreased caregiver assistance and eventual return to PLOF.         Recommendations for follow up therapy are one component of a multi-disciplinary discharge planning process, led by the attending physician.  Recommendations may be updated based on patient status, additional functional criteria and insurance authorization.  Follow Up Recommendations       Assistance Recommended at Discharge Intermittent Supervision/Assistance  Patient can return home with the following  A little help with walking and/or transfers;A little help with bathing/dressing/bathroom;Assistance with cooking/housework;Direct  supervision/assist for medications management;Help with stairs or ramp for entrance;Assist for transportation    Equipment Recommendations None recommended by PT  Recommendations for Other Services       Functional Status Assessment Patient has had a recent decline in their functional status and demonstrates the ability to make significant improvements in function in a reasonable and predictable amount of time.     Precautions / Restrictions Precautions Precautions: Fall Precaution Comments: seizure precautions Restrictions Weight Bearing Restrictions: No      Mobility  Bed Mobility Overal bed mobility: Modified Independent             General bed mobility comments: Min extra timea and effort only    Transfers Overall transfer level: Needs assistance Equipment used: Rolling walker (2 wheels) Transfers: Sit to/from Stand Sit to Stand: Min guard           General transfer comment: Min extra time and effort and cues for hand placement    Ambulation/Gait Ambulation/Gait assistance: Min guard Gait Distance (Feet): 50 Feet Assistive device: Rolling walker (2 wheels) Gait Pattern/deviations: Step-through pattern, Decreased step length - right, Decreased step length - left, Trunk flexed Gait velocity: decreased     General Gait Details: Max verbal and tactile cues for amb closer to the RW with urpight posture with poor carryover  Stairs            Wheelchair Mobility    Modified Rankin (Stroke Patients Only)       Balance Overall balance assessment: Needs assistance Sitting-balance support: Bilateral upper extremity supported, Feet unsupported Sitting balance-Leahy Scale: Fair     Standing balance support: Bilateral upper extremity supported, During functional activity, Reliant on assistive device for balance Standing balance-Leahy Scale: Fair  Pertinent Vitals/Pain Pain Assessment Pain Assessment:  No/denies pain    Home Living Family/patient expects to be discharged to:: Private residence Living Arrangements: Non-relatives/Friends Available Help at Discharge: Available 24 hours/day;Personal care attendant Type of Home: Mobile home Home Access: Ramped entrance       Home Layout: One level Home Equipment: Grab bars - tub/shower;Shower seat;Grab bars - toilet;Rolling Walker (2 wheels);BSC/3in1;Wheelchair - manual      Prior Function               Mobility Comments: Mod Ind amb household distances with a RW, no falls in the last 6 months ADLs Comments: Caregiver assists with ADLs     Hand Dominance   Dominant Hand: Right    Extremity/Trunk Assessment   Upper Extremity Assessment Upper Extremity Assessment: Generalized weakness;RUE deficits/detail;LUE deficits/detail;Defer to OT evaluation RUE Deficits / Details: Chronic RUE weakness from prior CVA but only minimal deficits compared to the LUE with BUE elbow strength grossly 4/5 RUE Sensation: WNL LUE Deficits / Details: BUE elbow strength grossly 4/5 LUE Sensation: WNL    Lower Extremity Assessment Lower Extremity Assessment: Generalized weakness;RLE deficits/detail;LLE deficits/detail RLE Deficits / Details: BLE hip flex, knee flex, and knee ext strength 4/5 RLE Sensation: WNL RLE Coordination: WNL LLE Deficits / Details: BLE hip flex, knee flex, and knee ext strength 4/5 LLE Sensation: WNL LLE Coordination: WNL       Communication   Communication: Expressive difficulties  Cognition Arousal/Alertness: Awake/alert Behavior During Therapy: WFL for tasks assessed/performed Overall Cognitive Status: History of cognitive impairments - at baseline                                          General Comments      Exercises     Assessment/Plan    PT Assessment Patient needs continued PT services  PT Problem List Decreased strength;Decreased activity tolerance;Decreased balance;Decreased  mobility;Decreased knowledge of use of DME       PT Treatment Interventions DME instruction;Gait training;Functional mobility training;Therapeutic activities;Therapeutic exercise;Balance training;Patient/family education    PT Goals (Current goals can be found in the Care Plan section)  Acute Rehab PT Goals Patient Stated Goal: To get stronger PT Goal Formulation: With patient/family Time For Goal Achievement: 03/02/23 Potential to Achieve Goals: Good    Frequency Min 2X/week     Co-evaluation               AM-PAC PT "6 Clicks" Mobility  Outcome Measure Help needed turning from your back to your side while in a flat bed without using bedrails?: None Help needed moving from lying on your back to sitting on the side of a flat bed without using bedrails?: None Help needed moving to and from a bed to a chair (including a wheelchair)?: A Little Help needed standing up from a chair using your arms (e.g., wheelchair or bedside chair)?: A Little Help needed to walk in hospital room?: A Little Help needed climbing 3-5 steps with a railing? : A Little 6 Click Score: 20    End of Session Equipment Utilized During Treatment: Gait belt Activity Tolerance: Patient tolerated treatment well Patient left: in bed;with call bell/phone within reach;with bed alarm set;with family/visitor present Nurse Communication: Mobility status PT Visit Diagnosis: Difficulty in walking, not elsewhere classified (R26.2);Muscle weakness (generalized) (M62.81);Unsteadiness on feet (R26.81)    Time: [id number] PT Time Calculation (min) (ACUTE ONLY): 28 min  Charges:   PT Evaluation $PT Eval Moderate Complexity: 1 Mod        D. Scott Tacora Athanas PT, DPT 02/17/23, 11:26 AM

## 2023-02-17 NOTE — Progress Notes (Addendum)
If sister Kathie Rhodes) calls or comes and wants information tell her to call her brother Richard Carter partial POA for updates.

## 2023-02-17 NOTE — Progress Notes (Signed)
*  PRELIMINARY RESULTS* Echocardiogram 2D Echocardiogram has been performed.  Sherrie Sport 02/17/2023, 7:56 AM

## 2023-02-17 NOTE — Plan of Care (Signed)

## 2023-02-17 NOTE — Progress Notes (Signed)
Initial Nutrition Assessment  DOCUMENTATION CODES:   Not applicable  INTERVENTION:   -RD will follow for diet advancement and add supplements as appropriate  NUTRITION DIAGNOSIS:   Inadequate oral intake related to dysphagia as evidenced by NPO status.  GOAL:   Patient will meet greater than or equal to 90% of their needs  MONITOR:   Diet advancement  REASON FOR ASSESSMENT:   Consult Assessment of nutrition requirement/status  ASSESSMENT:   Pt with history of hypertension, paroxysmal atrial fibrillation on Eliquis, hyperlipidemia mixed Alzheimer's and vascular dementia, history of recurrent CVA with known right arm weakness, who presents for chief concerns of mental status change, severe dysarthria.  Pt admitted with AMS and stroke-like symptoms.   3/25- s/p BSE- NPO  Reviewed I/O's: -200 ml x 24 hours   UOP: 500 ml x 24 hours  Per MD notes, MRI revealed acute stroke.   Case discussed with SLP; plan for NPO with plans to follow-up tomorrow and for possible instrumental testing. Pt tolerated some purees on evaluation, but concern for coughing on liquids and foods.   Spoke with pt and caregiver at bedside. Pt has improved since yesterday; caregiver reports he is more alerts and interactive than yesterday (pt smiling, laughing, and joking with this RD during interview). Per caregiver, pt is very cheerful and talkative at baseline and ambulates himself. Pt typically eats very well and consumes a low sodium, no fat foods diet. He consumes 3 meals per day and has regular consistency diet. Caregiver denies any difficulty tolerating regular consistency diet PTA. Per pt, he is hungry but understanding of NPO status.   Per caregiver, pt with no weight loss and suspect UBW is around 120#. Reviewed wt hx; no wt loss over the past 6 months.   Discussed care plan with pt and caregiver. They are understanding of NPO order.   Medications reviewed and include keppra.   Labs  reviewed: Na: 134.    NUTRITION - FOCUSED PHYSICAL EXAM:  Flowsheet Row Most Recent Value  Orbital Region No depletion  Upper Arm Region No depletion  Thoracic and Lumbar Region No depletion  Buccal Region No depletion  Temple Region Mild depletion  Clavicle Bone Region No depletion  Clavicle and Acromion Bone Region No depletion  Scapular Bone Region No depletion  Dorsal Hand No depletion  Patellar Region No depletion  Anterior Thigh Region No depletion  Posterior Calf Region No depletion  Edema (RD Assessment) Mild  Hair Reviewed  Eyes Reviewed  Mouth Reviewed  Skin Reviewed  Nails Reviewed       Diet Order:   Diet Order             Diet NPO time specified  Diet effective now                   EDUCATION NEEDS:   Education needs have been addressed  Skin:  Skin Assessment: Reviewed RN Assessment  Last BM:  Unknown  Height:   Ht Readings from Last 1 Encounters:  02/16/23 5\' 6"  (1.676 m)    Weight:   Wt Readings from Last 1 Encounters:  02/16/23 71 kg    Ideal Body Weight:  64.5 kg  BMI:  Body mass index is 25.26 kg/m.  Estimated Nutritional Needs:   Kcal:  [id number]  Protein:  90-105 grams  Fluid:  > 1.7 L    Loistine Chance, RD, LDN, [place] Registered Dietitian II Certified Diabetes Care and Education Specialist Please refer to [facility] for RD  and/or RD on-call/weekend/after hours pager

## 2023-02-17 NOTE — Progress Notes (Signed)
OT Cancellation Note  Patient Details Name: Richard Carter MRN: [id number] DOB: 02-Nov-1941   Cancelled Treatment:    Reason Eval/Treat Not Completed: Patient at procedure or test/ unavailable. OT orders received, chart reviewed. Pt currently off the floor for procedure. Will re-attempt evaluation as able.   Lanelle Bal  [facility] 02/17/2023, 11:24 AM

## 2023-02-17 NOTE — Progress Notes (Signed)
Eeg done 

## 2023-02-17 NOTE — Procedures (Signed)
Routine EEG Report  Richard Carter is a 82 y.o. male with a history of altered mental status who is undergoing an EEG to evaluate for seizures.  Report: This EEG was acquired with electrodes placed according to the International 10-20 electrode system (including Fp1, Fp2, F3, F4, C3, C4, P3, P4, O1, O2, T3, T4, T5, T6, A1, A2, Fz, Cz, Pz). The following electrodes were missing or displaced: none.  The occipital dominant rhythm was 6-7 Hz. This activity is reactive to stimulation. Drowsiness was manifested by background fragmentation; deeper stages of sleep were identified by K complexes and sleep spindles. There was no focal slowing. There were no interictal epileptiform discharges. There were no electrographic seizures identified. Photic stimulation and hyperventilation were not performed.  Impression and clinical correlation: This EEG was obtained while awake and asleep and is abnormal due to mild-to-moderate diffuse slowing indicative of global cerebral dysfunction. Epileptiform abnormalities were not seen during this recording.  Su Monks, MD [facility] [phone]  If 7pm- 7am, please page neurology on call as listed in [place].

## 2023-02-17 NOTE — Evaluation (Signed)
Occupational Therapy Evaluation Patient Details Name: Richard Carter MRN: [id number] DOB: Sep 01, 1941 Today's Date: 02/17/2023   History of Present Illness Pt is an 82 year old male with history of hypertension, paroxysmal atrial fibrillation on Eliquis, hyperlipidemia mixed Alzheimer's and vascular dementia, 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 MD assessment includes: Stroke-like symptom with MR impression "small acute infarcts in the cortex of the anterior left frontal lobe, posterior left frontal lobe, and left parietal lobe", seizures, HTN, leukocytosis, and severe protein-calorie malnutrition.   Clinical Impression   Patient agreeable to OT evaluation. Pt presenting with decreased independence in self care, balance, functional mobility/transfers, endurance, and safety awareness. PTA pt received assistance for ADLs/IADLs from PCA and was Mod I for functional mobility using a RW. Pt currently functioning at Mod I for bed mobility, Min guard for simulated toilet transfer, Min guard to take several steps along EOB using RW, and set up-supervision for seated LB dressing/grooming tasks. Anticipate CGA-Min A for standing ADL tasks. Pt will benefit from acute OT to increase overall independence in the areas of ADLs and functional mobility. OT recommends ongoing therapy upon discharge to maximize safety and independence with ADLs, decrease fall risk, decrease caregiver burden, and promote return to PLOF.     Recommendations for follow up therapy are one component of a multi-disciplinary discharge planning process, led by the attending physician.  Recommendations may be updated based on patient status, additional functional criteria and insurance authorization.   Assistance Recommended at Discharge Intermittent Supervision/Assistance  Patient can return home with the following A little help  with walking and/or transfers;A little help with bathing/dressing/bathroom;Assistance with cooking/housework;Assist for transportation;Help with stairs or ramp for entrance;Direct supervision/assist for financial management;Direct supervision/assist for medications management    Functional Status Assessment  Patient has had a recent decline in their functional status and demonstrates the ability to make significant improvements in function in a reasonable and predictable amount of time.  Equipment Recommendations  None recommended by OT    Recommendations for Other Services       Precautions / Restrictions Precautions Precautions: Fall Precaution Comments: seizure precautions Restrictions Weight Bearing Restrictions: No      Mobility Bed Mobility Overal bed mobility: Modified Independent                  Transfers Overall transfer level: Needs assistance Equipment used: Rolling walker (2 wheels) Transfers: Sit to/from Stand Sit to Stand: Min guard           General transfer comment: STS from EOB, VC for hand placment      Balance Overall balance assessment: Needs assistance Sitting-balance support: Bilateral upper extremity supported, Feet unsupported Sitting balance-Leahy Scale: Fair     Standing balance support: Bilateral upper extremity supported, During functional activity, Reliant on assistive device for balance Standing balance-Leahy Scale: Fair                             ADL either performed or assessed with clinical judgement   ADL Overall ADL's : Needs assistance/impaired Eating/Feeding: NPO   Grooming: Set up;Supervision/safety;Sitting               Lower Body Dressing: Set up;Supervision/safety;Sitting/lateral leans   Toilet Transfer: Min guard;Rolling walker (2 wheels) Toilet Transfer Details (indicate cue type and reason): simulated         Functional mobility during ADLs: Min  guard;Rolling walker (2 wheels) (to take  ~5 lateral steps along EOB) General ADL Comments: Anticipate CGA-Min A for standing ADL tasks     Vision Baseline Vision/History: 1 Wears glasses Patient Visual Report: No change from baseline       Perception     Praxis      Pertinent Vitals/Pain Pain Assessment Pain Assessment: No/denies pain     Hand Dominance Right   Extremity/Trunk Assessment Upper Extremity Assessment Upper Extremity Assessment: Generalized weakness RUE Deficits / Details: Chronic RUE weakness from prior CVA but only minimal deficits compared to the LUE with BUE elbow strength grossly 4/5 RUE Sensation: WNL LUE Deficits / Details: BUE elbow strength grossly 4/5 LUE Sensation: WNL   Lower Extremity Assessment Lower Extremity Assessment: Defer to PT evaluation       Communication Communication Communication: Expressive difficulties   Cognition Arousal/Alertness: Awake/alert Behavior During Therapy: WFL for tasks assessed/performed Overall Cognitive Status: History of cognitive impairments - at baseline           General Comments: followed commands well, difficult to understand at times, Oriented to self, "hospital", and year.     General Comments       Exercises Other Exercises Other Exercises:  OT provided education re: role of OT, OT POC, post acute recs, sitting up for all meals, EOB/OOB mobility with assistance, home/fall safety.    Shoulder Instructions      Home Living Family/patient expects to be discharged to:: Private residence Living Arrangements: Non-relatives/Friends Available Help at Discharge: Available 24 hours/day;Personal care attendant Type of Home: Mobile home Home Access: Ramped entrance     Home Layout: One level     Bathroom Shower/Tub: Teacher, early years/pre: Standard Bathroom Accessibility: Yes   Home Equipment: Grab bars - tub/shower;Shower seat;Grab bars - toilet;Rolling Walker (2 wheels);BSC/3in1;Wheelchair - manual          Prior  Functioning/Environment Prior Level of Function : Needs assist             Mobility Comments: Mod Ind amb household distances with a RW, no falls in the last 6 months ADLs Comments: Caregiver assists with ADLs/IADLs        OT Problem List: Decreased strength;Decreased activity tolerance;Impaired balance (sitting and/or standing);Decreased cognition;Decreased safety awareness;Decreased knowledge of use of DME or AE      OT Treatment/Interventions: Self-care/ADL training;Therapeutic exercise;Neuromuscular education;Energy conservation;DME and/or AE instruction;Manual therapy;Modalities;Balance training;Patient/family education;Visual/perceptual remediation/compensation;Cognitive remediation/compensation;Therapeutic activities;Splinting    OT Goals(Current goals can be found in the care plan section) Acute Rehab OT Goals Patient Stated Goal: return home OT Goal Formulation: With patient/family Time For Goal Achievement: 03/03/23 Potential to Achieve Goals: Good   OT Frequency: Min 2X/week    Co-evaluation              AM-PAC OT "6 Clicks" Daily Activity     Outcome Measure Help from another person eating meals?: A Little Help from another person taking care of personal grooming?: A Little Help from another person toileting, which includes using toliet, bedpan, or urinal?: A Little Help from another person bathing (including washing, rinsing, drying)?: A Little Help from another person to put on and taking off regular upper body clothing?: A Little Help from another person to put on and taking off regular lower body clothing?: A Little 6 Click Score: 18   End of Session Equipment Utilized During Treatment: Gait belt;Rolling walker (2 wheels) Nurse Communication: Mobility status  Activity Tolerance: Patient tolerated treatment well Patient left: in  bed;with call bell/phone within reach;with bed alarm set  OT Visit Diagnosis: Other symptoms and signs involving cognitive  function;Unsteadiness on feet (R26.81);Muscle weakness (generalized) (M62.81)                Time: 1650-1705 OT Time Calculation (min): 15 min Charges:  OT General Charges $OT Visit: 1 Visit OT Evaluation $OT Eval Low Complexity: 1 Low  [facility] MS, OTR/L ascom [phone]  02/17/23, 6:33 PM

## 2023-02-17 NOTE — Evaluation (Cosign Needed Addendum)
Clinical/Bedside Swallow Evaluation Patient Details  Name: Richard Carter MRN: [id number] Date of Birth: [birth year]  Today's Date: 02/17/2023 Time: SLP Start Time (ACUTE ONLY): 0845 SLP Stop Time (ACUTE ONLY): 0945 SLP Time Calculation (min) (ACUTE ONLY): 60 min  Past Medical History:  Past Medical History:  Diagnosis Date   Alcohol abuse    Dementia ([place])    GERD (gastroesophageal reflux disease)    HLD (hyperlipidemia)    HTN (hypertension)    Seizures (HCC)    Stroke [facility])    Past Surgical History:  Past Surgical History:  Procedure Laterality Date   skin cyst removal     HPI:  Per H&P, pt " is a 82 year old male with history of hyperlipidemia mixed Alzheimer's and vascular Dementia, hypertension, paroxysmal atrial fibrillation on Eliquis, 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     There was no note from prior stroke the patient had right-sided facial droop. CT of the head without contrast: Was read as no acute intracranial abnormality.  Old left frontal parietal lobe infarct and old left basal ganglia lacunar infarcts.  Moderate cerebral atrophy and chronic small vessel disease.  In ED, patient was able to tell me his name, the location.  He was not able to tell me his age, current calendar year. Patient was not able to provide further information and had dysarthria. Caregiver noted that patient was more altered upon waking up with worsening weakness on day of admission." Per chart review, pt w/ history of dysphagia, aspiration pneumonia, and GERD.   Assessment / Plan / Recommendation  Clinical Impression   Pt seen today for BSE. Pt sitting up in bed w/ caregiver present upon ST arrival. Pt alert, calm, and cooperative t/o eval. Pt's caregiver reported pt does not eat a modified diet at home. Dietician arrived during eval and observed several trials of eval. Pt left  sitting up in bed w/ dietician and caregiver present.  Of note: Pt w/ baseline Dementia. Of note: Pt w/ hx of dysphagia. Pt on RA; afebrile; WBC elevated.  OM exam completed and notable for ?new R labial weakness and min/mod R facial droop. Pt's lingual ROM and strength appeared WFL. Noted pt w/ upper dentures. No bottom incisors. Strong volitional cough. Oral care provided.  Regarding dysarthria, pt seen in 03/2022 and noted, " Pt presents with s/sx moderate-severe dysarthria c/b slow rate of speech, hoarse vocal quality, imprecise articulation, and reduced speech intelligibility (~50% to an unfamiliar listener)." Pt continues to present w/ dysarthria in this eval c/b imprecise articulation, hoarse vocal quality, and slow rate of speech. Pt's speech reduced in connected speech compared to word and phrase level. Unsure of pt's speech baseline. Repetition and increasing loudness were helpful to improve intelligibility. Discussed recommendation OP speech therapy w/ caregiver to address these deficits.  Regarding swallowing, pt appears to present w/ s/s consistent w/ suspected pharyngeal dysphagia. Pt appears to be at increased risk of aspiration/aspiration pneumonia given current presentation. Pt seen w/ trials of nectar thick liquids and puree. During oral phase, pt exhibited good bolus management/control, timely A-P bolus transit, and adequate anterior labial seal. During pharyngeal phase, noted immediate and delayed cough in 3/5 trials of nectar thick liquids. Pt w/ strong, continuous cough x2 w/ trials of puree. Noted hyolaryngeal elevation WNL via palpation.   Pt/caregiver educated on diet recommendation, SLP POC, and potential plan for MBSS in next 1-3 given pt's current  swallowing presentation. Pt/caregiver agreed.   Recommend continue NPO diet. Plan for MBSS in next 1-3 for more thorough evaluation regarding pt's pharyngeal stage of swallowing. Recommend frequent oral care. ST will f/u in next 1-2  days to monitor pt's progress and potential diet upgrade in setting of Acute CVA. RN updated/agreed.   SLP Visit Diagnosis: Dysphagia, pharyngeal phase (R13.13)    Aspiration Risk  Moderate aspiration risk;Severe aspiration risk;Risk for inadequate nutrition/hydration    Diet Recommendation  NPO  Medication Administration: Via alternative means    Other  Recommendations Oral Care Recommendations: Oral care QID    Recommendations for follow up therapy are one component of a multi-disciplinary discharge planning process, led by the attending physician.  Recommendations may be updated based on patient status, additional functional criteria and insurance authorization.  Follow up Recommendations Follow physician's recommendations for discharge plan and follow up therapies      Assistance Recommended at Discharge  Full  Functional Status Assessment Patient has had a recent decline in their functional status and/or demonstrates limited ability to make significant improvements in function in a reasonable and predictable amount of time  Frequency and Duration min 2x/week  2 weeks       Prognosis Prognosis for improved oropharyngeal function: Fair Barriers to Reach Goals: Cognitive deficits;Severity of deficits;Time post onset      Swallow Study   General Date of Onset: 02/16/23 HPI: Per H&P, pt " is a 82 year old male with history of hyperlipidemia mixed Alzheimer's and vascular Dementia, hypertension, paroxysmal atrial fibrillation on Eliquis, 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     There was no note from prior stroke the patient had right-sided facial droop. CT of the head without contrast: Was read as no acute intracranial abnormality.  Old left frontal parietal lobe infarct and old left basal ganglia lacunar infarcts.  Moderate cerebral atrophy and chronic small vessel  disease.  In ED, patient was able to tell me his name, the location.  He was not able to tell me his age, current calendar year. Patient was not able to provide further information and had dysarthria. Caregiver noted that patient was more altered upon waking up with worsening weakness on day of admission." Type of Study: Bedside Swallow Evaluation Previous Swallow Assessment: /72022 - pt put on dys 1 and nectar Diet Prior to this Study: NPO Temperature Spikes Noted: No (WBC 16.9) Respiratory Status: Room air History of Recent Intubation: No Behavior/Cognition: Alert;Cooperative;Pleasant mood Oral Cavity Assessment: Within Functional Limits Oral Care Completed by SLP: Yes Oral Cavity - Dentition: Dentures, top;Missing dentition Vision:  (NT) Self-Feeding Abilities: Total assist Patient Positioning: Upright in bed Baseline Vocal Quality: Hoarse Volitional Cough: Strong Volitional Swallow: Able to elicit    Oral/Motor/Sensory Function Overall Oral Motor/Sensory Function: Moderate impairment Facial ROM: Within Functional Limits Facial Symmetry: Abnormal symmetry right Facial Strength: Reduced right Facial Sensation: Within Functional Limits Lingual ROM: Within Functional Limits Lingual Symmetry: Abnormal symmetry right Lingual Strength: Within Functional Limits Lingual Sensation: Within Functional Limits Velum: Within Functional Limits Mandible: Within Functional Limits   Ice Chips Ice chips: Not tested   Thin Liquid Thin Liquid: Not tested    Nectar Thick Nectar Thick Liquid: Impaired Presentation: Spoon (X 5 sips) Pharyngeal Phase Impairments: Cough - Immediate   Honey Thick Honey Thick Liquid: Not tested   Puree Puree: Impaired Presentation: Spoon (x 10+ bites) Pharyngeal Phase Impairments: Cough - Immediate;Cough -  Delayed   Solid     Solid: Not tested     Randall Hiss Graduate Clinician [place], Speech Pathology   Randall Hiss 02/17/2023,12:39 PM

## 2023-02-18 ENCOUNTER — Inpatient Hospital Stay: Payer: Medicare HMO

## 2023-02-18 DIAGNOSIS — R2990 Unspecified symptoms and signs involving the nervous system: Secondary | ICD-10-CM | POA: Diagnosis not present

## 2023-02-18 LAB — CBC WITH DIFFERENTIAL/PLATELET
Abs Immature Granulocytes: 0.06 10*3/uL (ref 0.00–0.07)
Basophils Absolute: 0 10*3/uL (ref 0.0–0.1)
Basophils Relative: 1 %
Eosinophils Absolute: 0.4 10*3/uL (ref 0.0–0.5)
Eosinophils Relative: 5 %
HCT: 26 % — ABNORMAL LOW (ref 39.0–52.0)
Hemoglobin: 8.2 g/dL — ABNORMAL LOW (ref 13.0–17.0)
Immature Granulocytes: 1 %
Lymphocytes Relative: 15 %
Lymphs Abs: 1.3 10*3/uL (ref 0.7–4.0)
MCH: 26.8 pg (ref 26.0–34.0)
MCHC: 31.5 g/dL (ref 30.0–36.0)
MCV: 85 fL (ref 80.0–100.0)
Monocytes Absolute: 1 10*3/uL (ref 0.1–1.0)
Monocytes Relative: 12 %
Neutro Abs: 5.8 10*3/uL (ref 1.7–7.7)
Neutrophils Relative %: 66 %
Platelets: 263 10*3/uL (ref 150–400)
RBC: 3.06 MIL/uL — ABNORMAL LOW (ref 4.22–5.81)
RDW: 15.4 % (ref 11.5–15.5)
WBC: 8.6 10*3/uL (ref 4.0–10.5)
nRBC: 0 % (ref 0.0–0.2)

## 2023-02-18 LAB — ECHOCARDIOGRAM COMPLETE BUBBLE STUDY
AR max vel: 1.83 cm2
AV Area VTI: 2.04 cm2
AV Area mean vel: 1.74 cm2
AV Mean grad: 7 mmHg
AV Peak grad: 11.7 mmHg
Ao pk vel: 1.71 m/s
Area-P 1/2: 2.95 cm2
S' Lateral: 2.5 cm

## 2023-02-18 LAB — BASIC METABOLIC PANEL
Anion gap: 6 (ref 5–15)
BUN: 19 mg/dL (ref 8–23)
CO2: 20 mmol/L — ABNORMAL LOW (ref 22–32)
Calcium: 9.4 mg/dL (ref 8.9–10.3)
Chloride: 112 mmol/L — ABNORMAL HIGH (ref 98–111)
Creatinine, Ser: 1.19 mg/dL (ref 0.61–1.24)
GFR, Estimated: >60 mL/min (ref >60)
Glucose, Bld: 87 mg/dL (ref 70–99)
Potassium: 3.7 mmol/L (ref 3.5–5.1)
Sodium: 138 mmol/L (ref 135–145)

## 2023-02-18 LAB — HEMOGLOBIN A1C
Hgb A1c MFr Bld: 5.7 % — ABNORMAL HIGH (ref 4.8–5.6)
Mean Plasma Glucose: 117 mg/dL

## 2023-02-18 LAB — LEVETIRACETAM LEVEL: Levetiracetam Lvl: 31.9 ug/mL (ref 10.0–40.0)

## 2023-02-18 MED ORDER — SODIUM CHLORIDE 0.9 % IV SOLN
1.0000 g | INTRAVENOUS | Status: DC
  Administered 2023-02-18 – 2023-02-20 (×3): 1 g via INTRAVENOUS
  Filled 2023-02-18 (×3): qty 10

## 2023-02-18 MED ORDER — DEXTROSE-NACL 5-0.45 % IV SOLN: INTRAVENOUS | Status: DC

## 2023-02-18 MED ORDER — IOHEXOL 350 MG/ML SOLN
75.0000 mL | Freq: Once | INTRAVENOUS | Status: AC | PRN
  Administered 2023-02-18: 75 mL via INTRAVENOUS

## 2023-02-18 NOTE — Progress Notes (Signed)
  Evaluation after Contrast Extravasation  Patient seen and examined immediately after contrast extravasation while in CT 3 at [facility]. Right AC IV with approximately 70 ml of omnipaque extravasated.   Exam: There is moderate swelling at the right upper arm area.  There is no erythema. There is discoloration/bruising There are no blisters. There are no signs of decreased perfusion of the skin.  It is warm to touch.  The patient has full ROM in fingers.  Radial pulse is normal.  Per contrast extravasation protocol, I have instructed the patient to keep an ice pack on the area for 20-60 minutes at a time for about 48 hours.   Keep arm elevated as much as possible.   The patient understands to call the radiology department if there is: - increase in pain or swelling - changed or altered sensation - ulceration or blistering - increasing redness - warmth or increasing firmness - decreased tissue perfusion as noted by decreased capillary refill or discoloration of skin - decreased pulses peripheral to site   Theresa Duty, NP 02/18/2023 11:10 AM

## 2023-02-18 NOTE — Progress Notes (Addendum)
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 MD assessment includes: Stroke-like symptom with MR impression "small acute infarcts in the cortex of the anterior left frontal lobe, posterior left frontal lobe, and left parietal lobe", seizures, HTN, leukocytosis, and severe protein-calorie malnutrition.    PT Comments    Pt ready for session.  Min a x 1 for bed mobility.  He is able to stand and walk 78' with RW and min guard/assist +1.  He does seem to self limit gait distances but overall does well.  One episode where his knees buckle but he is able to self correct.  Pt agreeable to chair but transport in for testing so he is returned to bed with encouragement to get up with staff when he gets back.  Son in room and stated he feels good taking pt home and providing support.     Recommendations for follow up therapy are one component of a multi-disciplinary discharge planning process, led by the attending physician.  Recommendations may be updated based on patient status, additional functional criteria and insurance authorization.  Follow Up Recommendations       Assistance Recommended at Discharge Intermittent Supervision/Assistance  Patient can return home with the following A little help with walking and/or transfers;A little help with bathing/dressing/bathroom;Assistance with cooking/housework;Direct supervision/assist for medications management;Help with stairs or ramp for entrance;Assist for transportation   Equipment Recommendations  None recommended by PT    Recommendations for  Other Services       Precautions / Restrictions Precautions Precautions: Fall Precaution Comments: seizure precautions Restrictions Weight Bearing Restrictions: No     Mobility  Bed Mobility Overal bed mobility: Needs Assistance Bed Mobility: Supine to Sit     Supine to sit: Min assist          Transfers Overall transfer level: Needs assistance Equipment used: Rolling walker (2 wheels) Transfers: Sit to/from Stand Sit to Stand: Min guard, Min assist                Ambulation/Gait Ambulation/Gait assistance: Min guard, Min assist Gait Distance (Feet): 80 Feet Assistive device: Rolling walker (2 wheels) Gait Pattern/deviations: Step-through pattern, Decreased step length - right, Decreased step length - left, Trunk flexed Gait velocity: decreased     General Gait Details: one knee buckling but he is able to recover on her own with min a   Stairs             Wheelchair Mobility    Modified Rankin (Stroke Patients Only)       Balance Overall balance assessment: Needs assistance Sitting-balance support: Bilateral upper extremity supported, Feet unsupported Sitting balance-Leahy Scale: Fair     Standing balance support: Bilateral upper extremity supported, During functional activity, Reliant on assistive device for balance Standing balance-Leahy Scale: Fair                              Cognition Arousal/Alertness: Awake/alert Behavior During Therapy: WFL for tasks assessed/performed Overall Cognitive Status: History of cognitive impairments - at baseline  Exercises      General Comments        Pertinent Vitals/Pain Pain Assessment Pain Assessment: No/denies pain    Home Living                          Prior Function            PT Goals (current goals can now be found in the care plan section) Progress towards PT goals: Progressing toward goals     Frequency    Min 2X/week      PT Plan      Co-evaluation              AM-PAC PT "6 Clicks" Mobility   Outcome Measure  Help needed turning from your back to your side while in a flat bed without using bedrails?: None Help needed moving from lying on your back to sitting on the side of a flat bed without using bedrails?: A Little Help needed moving to and from a bed to a chair (including a wheelchair)?: A Little Help needed standing up from a chair using your arms (e.g., wheelchair or bedside chair)?: A Little Help needed to walk in hospital room?: A Little Help needed climbing 3-5 steps with a railing? : A Little 6 Click Score: 19    End of Session Equipment Utilized During Treatment: Gait belt Activity Tolerance: Patient tolerated treatment well Patient left: in bed;with call bell/phone within reach;with bed alarm set;with family/visitor present Nurse Communication: Mobility status PT Visit Diagnosis: Difficulty in walking, not elsewhere classified (R26.2);Muscle weakness (generalized) (M62.81);Unsteadiness on feet (R26.81)     Time: [id number] PT Time Calculation (min) (ACUTE ONLY): 16 min  Charges:  $Gait Training: 8-22 mins                   Chesley Noon, PTA 02/18/23, 9:45 AM

## 2023-02-18 NOTE — Progress Notes (Cosign Needed)
Speech Language Pathology Treatment: Dysphagia  Patient Details Name: Richard Carter MRN: [id number] DOB: 1941-06-02 Today's Date: 02/18/2023 Time: [id number] SLP Time Calculation (min) (ACUTE ONLY): 45 min  Assessment / Plan / Recommendation Clinical Impression  Pt seen today for dysphagia tx. Pt sitting upright in bed w/ caregiver/partial POA Richard Carter) present. Pt alert and cooperative t/o session. Pt adamantly expressed his want for food/drink upon ST arrival. Pt  observed with improved facial coloration today compared to yesterday's pale appearance. Pt left sitting up in bed w/ call button in reach and bed alarm set. Pt on RA; afebrile; WBC WNL.  Pt appears to present w/ a grossly functional oropharyngeal swallow w/ modified diet of nectar thick liquids via TSP and Dys 1 (purees). Suspect pt is at/near his swallowing baseline in setting of baseline dysphagia (see chart notes on 04/2021). Noted pt w/ some inconsistent coughing/throat clearing b/t trials. Pt and caregiver both expressed that pt typically clears his throat and intermittently coughs w/ meals at his baseline.   Pt observed w/ trials of puree (4 oz) and nectar thick liquids (10+ sips) via TSP. ST fed pt for majority of session w/ pt intermittently feeding himself as possible. ST provided min verbal cues for lingual sweep to clear mouth, dry swallowing, and small bites. During trials, pt noted w/ intermittent cough (~ 3-4 across all consistencies) -- noted improvement from yesterday's eval. Pt intermittently cleared throat b/t trials. Both pt/caregiver reported intermittent coughing/throat clearing is typical for pt at meals. Pt continues to exhibit functional oral phase of swallowing w/ timely A-P bolus transit, clear oral cavity post-po's, and good bolus control. No vocal decline nor decline in pulmonary status noted. Pt's respiratory status remained stable during/after trials. Pt adequately followed directions for lingual sweep and dry  swallow t/o trials.  Pt/caregiver educated on aspiration precautions (sitting upright, small bites, alternating b/t solids and liquids, eating slowly), diet recommendation, and SLP POC. Pt/caregiver appreciative and agreed.  Recommend Dys 1 diet (purees) w/ nectar thick liquids via TSP. Recommend following Strict aspiration precautions w/ focus on SMALL bites/sips, eating/drinking SLOWLY, alternating b/t solids/liquids, and sitting upright at all po's. Recommend full supervision at meals w/ cues for lingual sweep and dry swallow. Pt to feed self as much as possible and staff to assist as necessary for ease of oral intake. ST will f/u in next 1-2 days to monitor pt's diet tolerance and provide education. Recommend meds crushed in puree. RN/MD updated and agreed.    HPI HPI: Per H&P, pt " is a 82 year old male with history of hyperlipidemia mixed Alzheimer's and vascular Dementia, hypertension, paroxysmal atrial fibrillation on Eliquis, 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     There was no note from prior stroke the patient had right-sided facial droop. CT of the head without contrast: Was read as no acute intracranial abnormality.  Old left frontal parietal lobe infarct and old left basal ganglia lacunar infarcts.  Moderate cerebral atrophy and chronic small vessel disease.  In ED, patient was able to tell me his name, the location.  He was not able to tell me his age, current calendar year. Patient was not able to provide further information and had dysarthria. Caregiver noted that patient was more altered upon waking up with worsening weakness on day of admission."      SLP Plan  Continue with current plan of care      Recommendations  for follow up therapy are one component of a multi-disciplinary discharge planning process, led by the attending physician.  Recommendations may be  updated based on patient status, additional functional criteria and insurance authorization.    Recommendations  Diet recommendations: Dysphagia 1 (puree);Nectar-thick liquid Liquids provided via: Teaspoon;No straw Medication Administration: Crushed with puree Supervision: Staff to assist with self feeding;Full supervision/cueing for compensatory strategies Compensations: Minimize environmental distractions;Slow rate;Small sips/bites;Lingual sweep for clearance of pocketing;Multiple dry swallows after each bite/sip Postural Changes and/or Swallow Maneuvers: Upright 30-60 min after meal;Seated upright 90 degrees                  Oral care QID   Frequent or constant Supervision/Assistance Dysphagia, pharyngeal phase (R13.13)     Continue with current plan of care    [place], Speech Pathology   Richard Carter  02/18/2023, 3:25 PM

## 2023-02-18 NOTE — Progress Notes (Addendum)
Progress Note   Patient: Richard Carter [id number] DOB: Jan 12, 1941 DOA: 02/16/2023     1 DOS: the patient was seen and examined on 02/18/2023    Subjective:  Patient seen and examined at bedside this morning Patient noted to have dysarthria Still having right-sided facial droop Denies nausea vomiting and abdominal pain chest pain      Brief hospital course: [name] is a 82 year old male with history of hypertension, paroxysmal atrial fibrillation on Eliquis, hyperlipidemia mixed Alzheimer's and vascular dementia, 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 MRI of the brain showed findings of Small acute infarcts in the cortex of the anterior left frontal lobe, posterior left frontal lobe, and left parietal lobe.   Assessment and Plan:   Acute ischemic stroke Acute metabolic encephalopathy secondary to above Patient has new right-sided facial droop on presentation MRI of the brain showed findings of acute stroke A1c 5.7 Has completed 24 hours of permissive hypertension Neurology is on board and EEG showed no findings of encephalopathy but no seizures Frequent neuro vascular checks To start aspirin and statin therapy if patient cleared by speech therapist N.p.o. as recommended by speech therapy  Continue PT, OT, SLP Speech therapist planning modified barium swallow Fall precaution and aspiration precaution   AF (paroxysmal atrial fibrillation) (HCC) Eliquis 5 mg p.o. currently on hold, will hold patient on Lovenox given n.p.o. status   Seizures (HCC) Seizure precaution Follow-up with Keppra levels Home Keppra 500 mg twice daily p.o. not continued on admission and replaced with Keppra 500 mg IV twice daily   HTN (hypertension) Management as above Will resume home medication when able to swallow   Leukocytosis due to UTI Continue current  antibiotics Vancomycin discontinued Cefepime switched to ceftriaxone     Dysphagia Aspiration precaution ST on board   Protein-calorie malnutrition, severe Consult placed to registered dietitian   History of stroke Fall precaution   GERD (gastroesophageal reflux disease) PPI resumed   HLD (hyperlipidemia) Atorvastatin 20 mg nightly resumed after NPO status   Chart reviewed.    DVT prophylaxis: Lovenox Code Status: DNR Diet: Pending SLP evaluation.   Family Communication: updated Richard Carter, who is the primary caregiver and healthcare power of attorney at bedside.   Consults called: Neurology by EDP     Physical Exam:  Constitutional: appears age-appropriate, frail, NAD, calm, comfortable Eyes: PERRL, lids and conjunctivae normal HENMT: Has right-sided facial droop. Neck: normal, supple, no masses, no thyromegaly Respiratory: clear to auscultation bilaterally, no wheezing, Cardiovascular: Regular rate and rhythm, no murmurs / rubs / gallops.  Abdomen: no tenderness,  Musculoskeletal: no clubbing /contracture deformity of the left middle finger Skin: no rashes, lesions, ulcers. No induration Neurologic: Sensation intact. Strength 5/5 in all 4.  Dysarthria Psychiatric:  Depressed mood.    Data Reviewed: CBC significantly improved today  Family Communication: Discussed with patient's sister and patient's son present at bedside   Disposition: Patient meets inpatient criteria given her acute stroke    Planned Discharge Destination: Pending clinical course after evaluation by PT OT    Vitals:   02/17/23 1634 02/17/23 2045 02/18/23 0141 02/18/23 0808  BP: 124/89 (!) 154/78 (!) 146/82 138/78  Pulse: (!) 52 61 63 79  Resp: 18 18 18 18   Temp: 97.7 F (36.5 C) 97.7 F (36.5 C) 97.9 F (36.6 C) 98 F (36.7 C)  TempSrc:  Oral Oral   SpO2: 98% 97% 98%  96%  Weight:      Height:         Author: Loyce Dys, MD 02/18/2023 12:32 PM  For on call review  [email or web address].    Sepsis has been ruled out. Patient only met one of the SIRS criteria which is leukocytosis of 16.9 on presentation. UTI present on admission Antibiotics were subsequently de-escalated to ceftriaxone

## 2023-02-18 NOTE — Plan of Care (Addendum)
Neurology plan of care  Please see consult note from Dr. Cheral Marker signed 02/16/23 for initial findings and recommendations.  This is an 82 year old male with past medical history of alcohol abuse, dementia, hyperlipidemia, hypertension, seizures (on Keppra 500 mg twice daily), prior strokes, atrial fibrillation (on Eliquis 5 mg twice daily prior to admission) who was brought in by caregiver for 2 to 3 days of speech changes as well as generally not acting like himself.  He also had right arm weakness.  MRI brain confirmed very small/punctate acute infarcts scattered within the cortex of the anterior left frontal lobe, posterior left frontal lobe, and left parietal lobe.  Etiology of stroke was favored to be embolic.  Due to the small size of the strokes it was initially felt that Eliquis could be continued for secondary stroke prevention due to low risk of hemorrhagic conversion and the fact that patient was already 3 days out from symptom onset.  Patient remains n.p.o. secondary to dysphagia therefore he has been on therapeutic Lovenox for anticoagulation.  EEG showed no evidence of seizure activity.  TTE and cerebrovascular imaging are pending.  Stroke Labs     Component Value Date/Time   CHOL 116 02/17/2023 0413   TRIG 92 02/17/2023 0413   HDL 42 02/17/2023 0413   CHOLHDL 2.8 02/17/2023 0413   VLDL 18 02/17/2023 0413   LDLCALC 56 02/17/2023 0413    Lab Results  Component Value Date/Time   HGBA1C 5.7 (H) 02/16/2023 05:00 AM   Updated recommendations:  # Acute ischemic stroke -More than 48 hours from symptom onset goal is normotension, avoid hypotension. - Anticoagulation may be changed from therapeutic lovenox to eliquis when patient is able to take po.  From a neurostandpoint there is no indication for antiplatelets in addition to therapeutic anticoagulation. - F/u CTA H&N - F/u TTE - Continue home atorvastatin 20mg  daily (LDL is at goal) - q4 hr neuro checks - STAT head CT for any  change in neuro exam - Tele - PT/OT/SLP - Stroke education - Amb referral to neurology upon discharge   # Seizure disorder - Continue keppra 500mg  bid  Neurology will follow up on outstanding studies, and will otherwise be available prn for questions going forward.  Su Monks, MD [facility] [phone]  If 7pm- 7am, please page neurology on call as listed in [place].

## 2023-02-19 DIAGNOSIS — Z87891 Personal history of nicotine dependence: Secondary | ICD-10-CM

## 2023-02-19 DIAGNOSIS — I639 Cerebral infarction, unspecified: Secondary | ICD-10-CM

## 2023-02-19 DIAGNOSIS — Z7901 Long term (current) use of anticoagulants: Secondary | ICD-10-CM

## 2023-02-19 DIAGNOSIS — I6522 Occlusion and stenosis of left carotid artery: Secondary | ICD-10-CM

## 2023-02-19 DIAGNOSIS — R2990 Unspecified symptoms and signs involving the nervous system: Secondary | ICD-10-CM | POA: Diagnosis not present

## 2023-02-19 LAB — BASIC METABOLIC PANEL
Anion gap: 7 (ref 5–15)
BUN: 20 mg/dL (ref 8–23)
CO2: 18 mmol/L — ABNORMAL LOW (ref 22–32)
Calcium: 9.3 mg/dL (ref 8.9–10.3)
Chloride: 111 mmol/L (ref 98–111)
Creatinine, Ser: 1.14 mg/dL (ref 0.61–1.24)
GFR, Estimated: >60 mL/min (ref >60)
Glucose, Bld: 106 mg/dL — ABNORMAL HIGH (ref 70–99)
Potassium: 3.5 mmol/L (ref 3.5–5.1)
Sodium: 136 mmol/L (ref 135–145)

## 2023-02-19 LAB — CBC WITH DIFFERENTIAL/PLATELET
Abs Immature Granulocytes: 0.04 10*3/uL (ref 0.00–0.07)
Basophils Absolute: 0 10*3/uL (ref 0.0–0.1)
Basophils Relative: 0 %
Eosinophils Absolute: 0.1 10*3/uL (ref 0.0–0.5)
Eosinophils Relative: 2 %
HCT: 26.2 % — ABNORMAL LOW (ref 39.0–52.0)
Hemoglobin: 8.5 g/dL — ABNORMAL LOW (ref 13.0–17.0)
Immature Granulocytes: 1 %
Lymphocytes Relative: 9 %
Lymphs Abs: 0.8 10*3/uL (ref 0.7–4.0)
MCH: 27.2 pg (ref 26.0–34.0)
MCHC: 32.4 g/dL (ref 30.0–36.0)
MCV: 83.7 fL (ref 80.0–100.0)
Monocytes Absolute: 0.8 10*3/uL (ref 0.1–1.0)
Monocytes Relative: 9 %
Neutro Abs: 7.1 10*3/uL (ref 1.7–7.7)
Neutrophils Relative %: 79 %
Platelets: 253 10*3/uL (ref 150–400)
RBC: 3.13 MIL/uL — ABNORMAL LOW (ref 4.22–5.81)
RDW: 15.3 % (ref 11.5–15.5)
WBC: 8.8 10*3/uL (ref 4.0–10.5)
nRBC: 0 % (ref 0.0–0.2)

## 2023-02-19 MED ORDER — ADULT MULTIVITAMIN W/MINERALS CH
1.0000 | ORAL_TABLET | Freq: Every day | ORAL | Status: DC
  Administered 2023-02-19 – 2023-02-21 (×3): 1 via ORAL
  Filled 2023-02-19 (×3): qty 1

## 2023-02-19 MED ORDER — APIXABAN 5 MG PO TABS
5.0000 mg | ORAL_TABLET | Freq: Two times a day (BID) | ORAL | Status: DC
  Administered 2023-02-19 – 2023-02-21 (×4): 5 mg via ORAL
  Filled 2023-02-19 (×4): qty 1

## 2023-02-19 MED ORDER — NEPRO/CARBSTEADY PO LIQD
237.0000 mL | Freq: Two times a day (BID) | ORAL | Status: DC
  Administered 2023-02-19 – 2023-02-21 (×4): 237 mL via ORAL

## 2023-02-19 MED ORDER — LEVETIRACETAM 500 MG PO TABS
500.0000 mg | ORAL_TABLET | Freq: Two times a day (BID) | ORAL | Status: DC
  Administered 2023-02-19 – 2023-02-21 (×4): 500 mg via ORAL
  Filled 2023-02-19 (×4): qty 1

## 2023-02-19 NOTE — Progress Notes (Signed)
Occupational Therapy Treatment Patient Details Name: Richard Carter MRN: [id number] DOB: 1941/02/20 Today's Date: 02/19/2023   History of present illness Pt is an 82 year old male with history of hypertension, paroxysmal atrial fibrillation on Eliquis, hyperlipidemia mixed Alzheimer's and vascular dementia, 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 MD assessment includes: Stroke-like symptom with MR impression "small acute infarcts in the cortex of the anterior left frontal lobe, posterior left frontal lobe, and left parietal lobe", seizures, HTN, leukocytosis, and severe protein-calorie malnutrition.   OT comments  Richard Carter was seen for OT treatment on this date. Upon arrival to pt room, pt resting quietly but wakes to VCs. Pt agreeable to OT tx session. Untouched lunch tray noted at bedside. Pt initially declines interest but once seated EOB is able to hold thickened liquid cup and take sips with SET UP assisst and SELF AAROM of his ROM. Pt requires close MIN GUARD for STS and brief funcitonal transfer. Pt educated on safety and falls prevention t/o session. Session concluded with SLP in room at end of session. Pt making good progress toward goals and continues to benefit from skilled OT services to maximize return to PLOF and minimize risk of future falls, injury, caregiver burden, and readmission. Anticipate ongoing need for OT services upon acute hospital DC to maximize safety and return to functional independence in the home setting.     Recommendations for follow up therapy are one component of a multi-disciplinary discharge planning process, led by the attending physician.  Recommendations may be updated based on patient status, additional functional criteria and insurance authorization.    Assistance Recommended at Discharge Intermittent Supervision/Assistance  Patient can return  home with the following  A little help with walking and/or transfers;A little help with bathing/dressing/bathroom;Assistance with cooking/housework;Assist for transportation;Help with stairs or ramp for entrance;Direct supervision/assist for financial management;Direct supervision/assist for medications management   Equipment Recommendations  None recommended by OT    Recommendations for Other Services      Precautions / Restrictions Precautions Precautions: Fall Precaution Comments: seizure precautions Restrictions Weight Bearing Restrictions: No       Mobility Bed Mobility Overal bed mobility: Needs Assistance Bed Mobility: Supine to Sit, Sit to Supine     Supine to sit: Min assist Sit to supine: Min guard   General bed mobility comments: Min A for trunk elevation when coming to sitting position at EOB.    Transfers Overall transfer level: Needs assistance Equipment used: Rolling walker (2 wheels) Transfers: Sit to/from Stand Sit to Stand: Min guard, From elevated surface           General transfer comment: Min verbal cues for hand placement     Balance Overall balance assessment: Needs assistance Sitting-balance support: Bilateral upper extremity supported, Feet unsupported Sitting balance-Leahy Scale: Fair     Standing balance support: Bilateral upper extremity supported, During functional activity, Reliant on assistive device for balance Standing balance-Leahy Scale: Fair                             ADL either performed or assessed with clinical judgement   ADL Overall ADL's : Needs assistance/impaired   Eating/Feeding Details (indicate cue type and reason): Dysphagia 1 diet with nectar thick liquids. PT is able to take sips of nectar thick tea while seated eob with SET UP assist. He uses LUE to facilitate RUE flexion/elevation  when taking drinks from small cup. Grooming: Set up;Supervision/safety;Sitting                                Functional mobility during ADLs: Min guard;Rolling walker (2 wheels) General ADL Comments: Min guard for STS and side stepping at EOB. Pt continues to be functionally limited by decreased safety awareness, decreased ROM, and R sided weakness.    Extremity/Trunk Assessment              Vision Baseline Vision/History: 1 Wears glasses Patient Visual Report: No change from baseline     Perception     Praxis      Cognition Arousal/Alertness: Awake/alert Behavior During Therapy: WFL for tasks assessed/performed Overall Cognitive Status: History of cognitive impairments - at baseline                                 General Comments: followed commands well, difficult to understand at times.        Exercises Other Exercises Other Exercises: OT faciliated ADL management and brief seated UB exercises (bilat celing punches x10, and bilat shoulder press x10) to maximize functional use and strength of BUE for ADL participation. Pt educated on safety, transfer technique, and falls prevention t/o session.    Shoulder Instructions       General Comments      Pertinent Vitals/ Pain       Pain Assessment Pain Assessment: No/denies pain  Home Living                                          Prior Functioning/Environment              Frequency  Min 2X/week        Progress Toward Goals  OT Goals(current goals can now be found in the care plan section)  Progress towards OT goals: Progressing toward goals  Acute Rehab OT Goals Patient Stated Goal: to return home OT Goal Formulation: With patient Time For Goal Achievement: 03/03/23 Potential to Achieve Goals: Good  Plan Discharge plan remains appropriate;Frequency remains appropriate    Co-evaluation                 AM-PAC OT "6 Clicks" Daily Activity     Outcome Measure   Help from another person eating meals?: A Little Help from another person taking care of personal  grooming?: A Little Help from another person toileting, which includes using toliet, bedpan, or urinal?: A Little Help from another person bathing (including washing, rinsing, drying)?: A Little Help from another person to put on and taking off regular upper body clothing?: A Little Help from another person to put on and taking off regular lower body clothing?: A Little 6 Click Score: 18    End of Session Equipment Utilized During Treatment: Gait belt;Rolling walker (2 wheels)  OT Visit Diagnosis: Other symptoms and signs involving cognitive function;Unsteadiness on feet (R26.81);Muscle weakness (generalized) (M62.81)   Activity Tolerance Patient tolerated treatment well   Patient Left in bed;with call bell/phone within reach;with bed alarm set   Nurse Communication Mobility status        Time: 1354-1410 OT Time Calculation (min): 16 min  Charges: OT General Charges $OT Visit: 1 Visit OT Treatments $Self Care/Home  Management : 8-22 mins  Shara Blazing, M.S., OTR/L 02/19/23, 3:19 PM

## 2023-02-19 NOTE — Progress Notes (Addendum)
Speech Language Pathology Treatment: Dysphagia  Patient Details Name: Richard Carter MRN: [id number] DOB: 04-25-1941 Today's Date: 02/19/2023 Time: 1400-1430 SLP Time Calculation (min) (ACUTE ONLY): 30 min  Assessment / Plan / Recommendation Clinical Impression  Pt seen today for dysphagia tx. Pt sitting upright in bed w/ OT present. Pt alert and cooperative t/o session. RN reported pt did well w/ breakfast this morning -- noted min, intermittent coughing that did NOT impact his respiratory status/overall presentation. RN reported cough did not appear to be reason for concern. Pt consumed nectar thick liquids w/ OT during their session w/ no report of coughing (Pt sat edge of bed and followed aspiration precautions fairly independently w/ OT). Pt left sitting up in bed w/ call button in reach and bed alarm set. Pt on RA; afebrile; WBC WNL.   Pt appears to present w/ a grossly functional oropharyngeal swallow w/ modified diet of nectar thick liquids via TSP and Dys 1 (purees). Suspect pt is at/near his swallowing baseline in setting of baseline dysphagia (see chart notes on 04/2021). Noted cough x1 w/ larger sip of nectar thick liquids, not consistent in nature.   Pt observed w/ trials of puree (x6) and nectar thick liquids (x5) via CUP. Pt fed self during tx w/ min ST support, which is improvement from yesterday. ST provided min verbal cues for lingual sweep to clear mouth, dry swallowing, and small bites. Pt independently used lingual sweep and dry swallow. During trials, noted cough x1 w/ larger sip of nectar thick liquids. This was not appreciated w/ other trials of SMALL sips. Pt thoroughly educated importance on small sips/bites for swallowing safety/management. Pt continues to exhibit functional oral phase of swallowing w/ timely A-P bolus transit, clear oral cavity post-po's, and good bolus control. No vocal decline nor decline in pulmonary status noted. Pt adequately followed directions for  lingual sweep and dry swallow t/o trials. Pt appeared much improved w/ po trials and following his precautions from yesterday's session.   Pt educated on aspiration precautions (sitting upright, small bites, alternating b/t solids and liquids, eating slowly), diet recommendation, and SLP POC. Pt exhibited understanding by applying precautions t/o trials and participating in teach back of strategies.    Recommend continue Dys 1 diet (purees) w/ nectar thick liquids via TSP/CUP. Recommend following Strict aspiration precautions w/ focus on SMALL bites/sips, eating/drinking SLOWLY, alternating b/t solids/liquids, and sitting upright at all po's. Recommend full supervision at meals w/ cues for lingual sweep and dry swallow. Pt to feed self as much as possible and staff to assist as necessary for ease of oral intake. Recommend meds crushed in puree. RN updated and agreed.  No further acute ST needs at this time. Pt to f/u w/ ST services at next venue of care as needed/indicated. NSG/MD updated and agreed.   HPI HPI: Per H&P, pt " is a 82 year old male with history of hyperlipidemia mixed Alzheimer's and vascular Dementia, hypertension, paroxysmal atrial fibrillation on Eliquis, 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     There was no note from prior stroke the patient had right-sided facial droop. CT of the head without contrast: Was read as no acute intracranial abnormality.  Old left frontal parietal lobe infarct and old left basal ganglia lacunar infarcts.  Moderate cerebral atrophy and chronic small vessel disease.  In ED, patient was able to tell me his name, the location.  He was not  able to tell me his age, current calendar year. Patient was not able to provide further information and had dysarthria. Caregiver noted that patient was more altered upon waking up with worsening weakness on day of  admission."      SLP Plan  Acute care goals met.      Recommendations for follow up therapy are one component of a multi-disciplinary discharge planning process, led by the attending physician.  Recommendations may be updated based on patient status, additional functional criteria and insurance authorization.    Recommendations  Diet recommendations: Dysphagia 1 (puree);Nectar-thick liquid Liquids provided via: Teaspoon;Cup (monitor cup sips - SMALL sips only) Medication Administration: Crushed with puree Supervision: Staff to assist with self feeding;Full supervision/cueing for compensatory strategies Compensations: Minimize environmental distractions;Slow rate;Small sips/bites;Lingual sweep for clearance of pocketing;Multiple dry swallows after each bite/sip Postural Changes and/or Swallow Maneuvers: Upright 30-60 min after meal;Seated upright 90 degrees                  Oral care BID;Oral care before and after PO   Frequent or constant Supervision/Assistance Dysphagia, pharyngeal phase (R13.13)     Continue with current plan of care    Randall Hiss Graduate Clinician [place], Speech Pathology   Randall Hiss  02/19/2023, 2:32 PM

## 2023-02-19 NOTE — Plan of Care (Signed)
  Problem: Education: Goal: Knowledge of disease or condition will improve Outcome: Progressing Goal: Knowledge of secondary prevention will improve (MUST DOCUMENT ALL) Outcome: Progressing   Problem: Ischemic Stroke/TIA Tissue Perfusion: Goal: Complications of ischemic stroke/TIA will be minimized Outcome: Progressing   Problem: Coping: Goal: Will verbalize positive feelings about self Outcome: Progressing   Problem: Health Behavior/Discharge Planning: Goal: Ability to manage health-related needs will improve Outcome: Progressing   Problem: Self-Care: Goal: Ability to participate in self-care as condition permits will improve Outcome: Progressing   Problem: Education: Goal: Knowledge of General Education information will improve Description: Including pain rating scale, medication(s)/side effects and non-pharmacologic comfort measures Outcome: Progressing   Problem: Health Behavior/Discharge Planning: Goal: Ability to manage health-related needs will improve Outcome: Progressing   Problem: Activity: Goal: Risk for activity intolerance will decrease Outcome: Progressing   Problem: Nutrition: Goal: Adequate nutrition will be maintained Outcome: Progressing   Problem: Pain Managment: Goal: General experience of comfort will improve Outcome: Progressing   Problem: Safety: Goal: Ability to remain free from injury will improve Outcome: Progressing   Problem: Skin Integrity: Goal: Risk for impaired skin integrity will decrease Outcome: Progressing

## 2023-02-19 NOTE — Progress Notes (Signed)
Progress Note   Patient: Richard Carter [id number] DOB: 1941/10/02 DOA: 02/16/2023     2 DOS: the patient was seen and examined on 02/19/2023    Subjective:  Patient seen and examined at bedside this morning Patient noted to have dysarthria Still having right-sided facial droop Has been cleared by speech therapist to be able to take oral medications as well as diet. Neurologist consulted given high-grade carotid stenosis on the left Denies nausea vomiting and abdominal pain chest pain      Brief hospital course: [name] is a 82 year old male with history of hypertension, paroxysmal atrial fibrillation on Eliquis, hyperlipidemia mixed Alzheimer's and vascular dementia, 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 MRI of the brain showed findings of Small acute infarcts in the cortex of the anterior left frontal lobe, posterior left frontal lobe, and left parietal lobe.   Assessment and Plan:   Acute ischemic stroke Acute metabolic encephalopathy secondary to above Patient has new right-sided facial droop on presentation MRI of the brain showed findings of acute stroke A1c 5.7 Neurology is on board and EEG showed no findings of encephalopathy but no seizures Frequent neuro vascular checks Patient has been cleared by speech therapist to be able to take p.o. We will continue on the patient's home dose of Eliquis From stroke standpoint neurology does not recommend antiplatelet at this time except for continuation of Eliquis Continue PT, OT, SLP Fall precaution and aspiration precaution   High-grade left carotid artery stenosis Vascular surgeon has been consulted for his input  Sepsis has been ruled out. Patient only met one of the SIRS criteria which is leukocytosis of 16.9 on presentation. UTI present on admission Antibiotics were subsequently de-escalated to  ceftriaxone  AF (paroxysmal atrial fibrillation) (HCC) Eliquis 5 mg p.o.  Currently rate controlled   Seizures (HCC) Seizure precaution Continue Keppra   HTN (hypertension) Management as above Continue current home medications as blood pressure allows   Leukocytosis due to UTI Continue current antibiotics Vancomycin discontinued Cefepime switched to ceftriaxone     Dysphagia Aspiration precaution ST on board   Protein-calorie malnutrition, severe Consult placed to registered dietitian   History of stroke Fall precaution   GERD (gastroesophageal reflux disease) PPI resumed   HLD (hyperlipidemia) Atorvastatin 20 mg nightly   Chart reviewed.  CTA head and neck showing high-grade stenosis in the left carotid artery   DVT prophylaxis: Continue Eliquis Code Status: DNR Diet: Pending SLP evaluation.   Family Communication: updated Richard Carter, who is the primary caregiver and healthcare power of attorney    Consults called: Neurology by EDP     Physical Exam:   Constitutional: appears age-appropriate, frail, NAD, calm, comfortable Eyes: PERRL, lids and conjunctivae normal HENMT: Has right-sided facial droop. Neck: normal, supple, no masses, no thyromegaly Respiratory: clear to auscultation bilaterally, no wheezing, Cardiovascular: Regular rate and rhythm, no murmurs / rubs / gallops.  Abdomen: no tenderness,  Musculoskeletal: no clubbing /contracture deformity of the left middle finger Skin: no rashes, lesions, ulcers. No induration Neurologic: Sensation intact. Strength 5/5 in all 4.  Dysarthria    Data Reviewed: CBC significantly improved today   Family Communication: Discussed with patient's sister and patient's son present at bedside   Disposition: Patient meets inpatient criteria given her acute stroke    Planned Discharge Destination: Pending clinical course after vascular surgery eval        Physical Exam: Vitals:  02/18/23 2359 02/19/23 0434  02/19/23 0800 02/19/23 1118  BP: (!) 122/59 134/63 (!) 140/78 131/82  Pulse: 71 73 69 87  Resp: 17 18 17 18   Temp: 97.8 F (36.6 C) 97.9 F (36.6 C) 97.9 F (36.6 C)   TempSrc:      SpO2: 95% 97% 98% 97%  Weight:      Height:        Time spent: 35 minutes  Author: Verline Lema, MD 02/19/2023 2:21 PM  For on call review [email or web address].

## 2023-02-19 NOTE — Progress Notes (Signed)
Physical Therapy Treatment Patient Details Name: Richard Carter MRN: [id number] DOB: January 29, 1941 Today's Date: 02/19/2023   History of Present Illness Pt is an 82 year old male with history of hypertension, paroxysmal atrial fibrillation on Eliquis, hyperlipidemia mixed Alzheimer's and vascular dementia, 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 MD assessment includes: Stroke-like symptom with MR impression "small acute infarcts in the cortex of the anterior left frontal lobe, posterior left frontal lobe, and left parietal lobe", seizures, HTN, leukocytosis, and severe protein-calorie malnutrition.    PT Comments    Pt was pleasant and motivated to participate during the session and put forth good effort throughout. Pt required min A with bed mobility and transfers with cues for sequencing and followed commands well during the session.  Pt was able to amb 80 feet with a RW with cuing for upright posture and amb closer to the RW but presented with no overt LOB or adverse symptoms.  Pt will benefit from continued PT services upon discharge to safely address deficits listed in patient problem list for decreased caregiver assistance and eventual return to PLOF.      Recommendations for follow up therapy are one component of a multi-disciplinary discharge planning process, led by the attending physician.  Recommendations may be updated based on patient status, additional functional criteria and insurance authorization.  Follow Up Recommendations       Assistance Recommended at Discharge Intermittent Supervision/Assistance  Patient can return home with the following A little help with walking and/or transfers;A little help with bathing/dressing/bathroom;Assistance with cooking/housework;Direct supervision/assist for medications management;Help with stairs or ramp for entrance;Assist for  transportation   Equipment Recommendations  None recommended by PT    Recommendations for Other Services       Precautions / Restrictions Precautions Precautions: Fall Precaution Comments: seizure precautions Restrictions Weight Bearing Restrictions: No     Mobility  Bed Mobility Overal bed mobility: Needs Assistance       Supine to sit: Min assist     General bed mobility comments: Min A for trunk to full upright position    Transfers Overall transfer level: Needs assistance Equipment used: Rolling walker (2 wheels) Transfers: Sit to/from Stand Sit to Stand: Min assist, From elevated surface           General transfer comment: Min verbal cues for hand placement    Ambulation/Gait Ambulation/Gait assistance: Min guard Gait Distance (Feet): 80 Feet Assistive device: Rolling walker (2 wheels) Gait Pattern/deviations: Step-through pattern, Decreased step length - right, Decreased step length - left, Trunk flexed Gait velocity: decreased     General Gait Details: Mod verbal cues for upright posture and amb closer to the [employer] Mobility    Modified Rankin (Stroke Patients Only)       Balance Overall balance assessment: Needs assistance Sitting-balance support: Bilateral upper extremity supported, Feet unsupported Sitting balance-Leahy Scale: Fair     Standing balance support: Bilateral upper extremity supported, During functional activity, Reliant on assistive device for balance Standing balance-Leahy Scale: Fair                              Cognition Arousal/Alertness: Awake/alert Behavior During Therapy: WFL for tasks assessed/performed Overall Cognitive Status: History of cognitive impairments - at baseline  Exercises Total Joint Exercises Ankle Circles/Pumps: AROM, Strengthening, Both, 10 reps Quad Sets: Strengthening, Both, 10  reps Hip ABduction/ADduction: AAROM, Strengthening, Both, 10 reps Straight Leg Raises: AAROM, Strengthening, Both, 10 reps Long Arc Quad: AROM, Strengthening, Both, 10 reps Knee Flexion: Strengthening, Both, 10 reps, AROM    General Comments        Pertinent Vitals/Pain Pain Assessment Pain Assessment: No/denies pain    Home Living                          Prior Function            PT Goals (current goals can now be found in the care plan section) Progress towards PT goals: Progressing toward goals    Frequency    Min 2X/week      PT Plan Current plan remains appropriate    Co-evaluation              AM-PAC PT "6 Clicks" Mobility   Outcome Measure  Help needed turning from your back to your side while in a flat bed without using bedrails?: None Help needed moving from lying on your back to sitting on the side of a flat bed without using bedrails?: A Little Help needed moving to and from a bed to a chair (including a wheelchair)?: A Little Help needed standing up from a chair using your arms (e.g., wheelchair or bedside chair)?: A Little Help needed to walk in hospital room?: A Little Help needed climbing 3-5 steps with a railing? : A Little 6 Click Score: 19    End of Session Equipment Utilized During Treatment: Gait belt Activity Tolerance: Patient tolerated treatment well Patient left: with call bell/phone within reach;in chair;with chair alarm set Nurse Communication: Mobility status PT Visit Diagnosis: Difficulty in walking, not elsewhere classified (R26.2);Muscle weakness (generalized) (M62.81);Unsteadiness on feet (R26.81)     Time: [id number] PT Time Calculation (min) (ACUTE ONLY): 24 min  Charges:  $Gait Training: 8-22 mins $Therapeutic Exercise: 8-22 mins                     D. Scott Frona Yost PT, DPT 02/19/23, 10:51 AM

## 2023-02-19 NOTE — Consult Note (Signed)
Hospital Consult    Reason for Consult:  High Grade Left Carotid Stenosis Requesting Physician:  Dr Marguerita Merles MD MRN #:  [id number]  History of Present Illness: This is a 82 y.o. male with history of hypertension, paroxysmal atrial fibrillation on Eliquis, hyperlipidemia mixed Alzheimer's and vascular dementia, 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 MRI of the brain showed findings of Small acute infarcts in the cortex of the anterior left frontal lobe, posterior left frontal lobe, and left parietal lobe. On CT Scan of the head and neck patient is noted to have a high grade stenosis of the left carotid artery.   On exam this afternoon the patient was unable to speak without slurring his speech.  Do not believe at this point in time he was understanding any of my commands.  Patient seemed to be very weak and debilitated and falling asleep through my examination.  I will attempt to contact the patient's power of attorney Julian Reil at [phone] for further assistance in the care of Mr. Haydee Monica.    Past Medical History:  Diagnosis Date   Alcohol abuse    Dementia ([facility])    GERD (gastroesophageal reflux disease)    HLD (hyperlipidemia)    HTN (hypertension)    Seizures (HCC)    Stroke [facility])     Past Surgical History:  Procedure Laterality Date   skin cyst removal      No Known Allergies  Prior to Admission medications   Medication Sig Start Date End Date Taking? Authorizing Provider  apixaban (ELIQUIS) 5 MG TABS tablet Take 1 tablet (5 mg total) by mouth 2 (two) times daily. 05/29/22  Yes Jennye Boroughs, MD  atorvastatin (LIPITOR) 20 MG tablet Take 20 mg by mouth every evening.   Yes [provider]  levETIRAcetam (KEPPRA) 500 MG tablet Take 1 tablet (500 mg total) by mouth 2 (two) times daily. 12/01/20  Yes Danford, Suann Dreydon, MD  metoprolol  succinate (TOPROL-XL) 25 MG 24 hr tablet Take 0.5 tablets (12.5 mg total) by mouth daily. 04/30/22  Yes Swayze, Ava, DO  pantoprazole (PROTONIX) 40 MG tablet Take 1 tablet (40 mg total) by mouth 2 (two) times daily. After sixty days the patient is to continue protonix at 40 mg daily. Patient taking differently: Take 40 mg by mouth daily. 04/29/22  Yes Swayze, Ava, DO  traZODone (DESYREL) 100 MG tablet Take 1 tablet (100 mg total) by mouth at bedtime. 05/11/21  Yes Sreenath, Sudheer B, MD  amLODipine (NORVASC) 5 MG tablet Take 1 tablet (5 mg total) by mouth daily. 05/12/21   Sidney Ace, MD  food thickener (SIMPLYTHICK, NECTAR/LEVEL 2/MILDLY THICK,) GEL Take 1 packet by mouth as needed. 04/29/22   Swayze, Ava, DO  losartan (COZAAR) 50 MG tablet Take 50 mg by mouth daily. 02/25/22   [provider]    Social History   Socioeconomic History   Marital status: Widowed    Spouse name: Not on file   Number of children: Not on file   Years of education: Not on file   Highest education level: Not on file  Occupational History   Not on file  Tobacco Use   Smoking status: Former    Packs/day: 0.50    Years: 68.00    Additional pack years: 0.00    Total pack years: 34.00    Types: Cigarettes    Quit date: 2023  Years since quitting: 1.2   Smokeless tobacco: Never  Vaping Use   Vaping Use: Never used  Substance and Sexual Activity   Alcohol use: Not Currently    Comment: Drinks about 4oz of liquor daily. Last drink was yesterday 04/19/22.   Drug use: Never   Sexual activity: Not Currently  Other Topics Concern   Not on file  Social History Narrative   Not on file   Social Determinants of Health   Financial Resource Strain: Not on file  Food Insecurity: No Food Insecurity (02/16/2023)   Hunger Vital Sign    Worried About Running Out of Food in the Last Year: Never true    Ran Out of Food in the Last Year: Never true  Transportation Needs: No Transportation Needs (02/16/2023)    PRAPARE - Hydrologist (Medical): No    Lack of Transportation (Non-Medical): No  Physical Activity: Not on file  Stress: Not on file  Social Connections: Not on file  Intimate Partner Violence: Not At Risk (02/16/2023)   Humiliation, Afraid, Rape, and Kick questionnaire    Fear of Current or Ex-Partner: No    Emotionally Abused: No    Physically Abused: No    Sexually Abused: No     Family History  Problem Relation Age of Onset   Stroke Mother    Cancer Brother     ROS: Otherwise negative unless mentioned in HPI  Physical Examination  Vitals:   02/19/23 0800 02/19/23 1118  BP: (!) 140/78 131/82  Pulse: 69 87  Resp: 17 18  Temp: 97.9 F (36.6 C)   SpO2: 98% 97%   Body mass index is 25.26 kg/m.  General:  WDWN in NAD Gait: Not observed HENT: WNL, normocephalic Pulmonary: normal non-labored breathing, without Rales, rhonchi,  wheezing Cardiac: irregular, HX AFIB, without  Murmurs, rubs or gallops; without carotid bruits Abdomen: Positive Bowel Sounds, soft, NT/ND, no masses Skin: without rashes Vascular Exam/Pulses: Bilateral Palpable Pulses  Extremities: without ischemic changes, without Gangrene , without cellulitis; without open wounds;  Musculoskeletal: no muscle wasting or atrophy  Neurologic: A&O X 3;  No focal weakness or paresthesias are detected; speech is fluent/normal Psychiatric:  The pt has Abnormal- Alzheimer's Dementia  affect. Lymph:  Unremarkable  CBC    Component Value Date/Time   WBC 8.8 02/19/2023 0614   RBC 3.13 (L) 02/19/2023 0614   HGB 8.5 (L) 02/19/2023 0614   HGB 12.9 (L) 10/25/2014 1048   HCT 26.2 (L) 02/19/2023 0614   HCT 38.6 (L) 10/25/2014 1048   PLT 253 02/19/2023 0614   PLT 93 (L) 10/25/2014 1048   MCV 83.7 02/19/2023 0614   MCV 108 (H) 10/25/2014 1048   MCH 27.2 02/19/2023 0614   MCHC 32.4 02/19/2023 0614   RDW 15.3 02/19/2023 0614   RDW 19.6 (H) 10/25/2014 1048   LYMPHSABS 0.8 02/19/2023  0614   LYMPHSABS 0.8 (L) 10/25/2014 1048   MONOABS 0.8 02/19/2023 0614   MONOABS 0.9 10/25/2014 1048   EOSABS 0.1 02/19/2023 0614   EOSABS 0.0 10/25/2014 1048   BASOSABS 0.0 02/19/2023 0614   BASOSABS 0.0 10/25/2014 1048    BMET    Component Value Date/Time   NA 136 02/19/2023 0614   NA 133 (L) 10/25/2014 1048   K 3.5 02/19/2023 0614   K 3.3 (L) 10/25/2014 1048   CL 111 02/19/2023 0614   CL 92 (L) 10/25/2014 1048   CO2 18 (L) 02/19/2023 0614   CO2 30  10/25/2014 1048   GLUCOSE 106 (H) 02/19/2023 0614   GLUCOSE 125 (H) 10/25/2014 1048   BUN 20 02/19/2023 0614   BUN 18 10/25/2014 1048   CREATININE 1.14 02/19/2023 0614   CREATININE 1.02 10/25/2014 1048   CALCIUM 9.3 02/19/2023 0614   CALCIUM 8.9 10/25/2014 1048   GFRNONAA >60 02/19/2023 0614   GFRNONAA >60 10/25/2014 1048   GFRNONAA >60 06/09/2014 0431   GFRAA >60 02/24/2020 0401   GFRAA >60 10/25/2014 1048   GFRAA >60 06/09/2014 0431    COAGS: Lab Results  Component Value Date   INR 1.1 06/27/2022   INR 1.1 05/27/2022   INR 1.0 04/23/2022     Non-Invasive Vascular Imaging:   EXAM: CT ANGIOGRAPHY HEAD AND NECK  FINDINGS: CT HEAD FINDINGS   Brain: The small acute infarcts seen on the brain MRI from 2 days prior not well seen on the current study. There is no evidence of acute intracranial hemorrhage, extra-axial fluid collection, or new acute territorial infarct.   Parenchymal volume is stable. The ventricles are stable in size. The remote cortical infarcts in the left frontal, right parietal, and bilateral occipital lobes are unchanged.   There is no mass lesion.  There is no mass effect or midline shift.   Vascular: See below.   Skull: Normal. Negative for fracture or focal lesion.   Sinuses/Orbits: The paranasal sinuses are clear. The globes and orbits   Other: Are unremarkable.  None.   Review of the MIP images confirms the above findings   CTA NECK FINDINGS   Aortic arch: There is mild  calcified plaque in the aortic arch. The origins of the major branch vessels are patent. The subclavian arteries are patent to the level imaged with extensive calcified plaque.   Right carotid system: The right common carotid artery is patent with scattered plaque but no hemodynamically significant stenosis. There is calcified plaque at the bifurcation without hemodynamically significant stenosis or occlusion of the internal carotid artery. The external carotid artery is patent. There is no evidence of dissection or aneurysm.   Left carotid system: The plaque in the distal common carotid artery resulting in high-grade stenosis. Plaque extends into the proximal internal carotid artery resulting in approximately 50% stenosis. The distal internal carotid artery is patent. The external carotid artery is patent but stenotic at its origin. There is no evidence of dissection or aneurysm. These findings are unchanged.   Vertebral arteries: There is moderate stenosis of the origin of the right vertebral artery. The vertebral arteries are otherwise patent, without other significant stenosis or occlusion in the neck. There is no evidence of dissection or aneurysm.   Skeleton: There is advanced multilevel degenerative change of the cervical spine. There is no acute osseous abnormality or suspicious osseous lesion. There is no visible canal hematoma.   Other neck: The soft tissues of the neck are unremarkable.   Upper chest: The imaged lung apices are clear.   Review of the MIP images confirms the above findings   CTA HEAD FINDINGS   Anterior circulation: There is calcified plaque in the carotid siphons resulting in mild-to-moderate stenosis bilaterally   The bilateral MCAs are patent, with no proximal high-grade stenosis or occlusion.   The bilateral ACAs are patent, without proximal high-grade stenosis or occlusion.   There is no aneurysm or AVM.   Posterior circulation: There is  calcified plaque in the distal V3 segments, V4 junction, and left V4 segment resulting in up to mild-to-moderate stenosis of the  left V4 segment. The right vertebral artery is diminutive after the PICA origin likely reflecting atherosclerotic disease. PICA is identified bilaterally. The basilar artery is patent.   The bilateral PCAs are patent, without proximal high-grade stenosis or occlusion. A left posterior communicating artery is identified.   There is no aneurysm or AVM.   Venous sinuses: Suboptimally evaluated due to bolus timing.   Anatomic variants: None.   Review of the MIP images confirms the above findings   IMPRESSION: 1. The small acute infarcts seen on the recent brain MRI are not well seen on the current study. No new acute intracranial pathology. 2. Stable CTA head/neck since 06/27/2022 with no new emergent finding. 3. Unchanged bulky plaque in the distal left common carotid artery resulting in high-grade stenosis. Calcified plaque at the right carotid bifurcation without hemodynamically significant stenosis, and moderate stenosis of the origin of the right vertebral artery. 4. Unchanged calcified plaque in the carotid siphons without high-grade stenosis, and diminutive right V4 segment.  Statin:  Yes.   Beta Blocker:  Yes.   Aspirin:  No. ACEI:  No. ARB:  No. CCB use:  Yes Other antiplatelets/anticoagulants:  Yes.   Eliquis 5 mg BID   ASSESSMENT/PLAN: This is a 82 y.o. male with a history of recurrent CVA with known right arm weakness who was brought to [facility] emergency department for concerns of mental status change with severe dysarthria.  Upon workup and CT scan of the head neck patient was noted to have a high-grade stenosis of the left carotid artery.  PLAN: Vascular surgery at this time recommends that the patient undergo an open carotid endarterectomy for his left carotid artery high-grade stenosis.  Due to his current acute stroke with small acute  infarcts noted on the MRI of his brain any surgery would have to be delayed 3 to 4 weeks due to possible reperfusion injury/bleeding.  Patient will also need cardiac clearance prior to surgery.  At this time vascular surgery recommends the patient remain on his Eliquis 5 mg twice daily.  We will try to reach out and contact his power of attorney to discuss surgery recommendations and get consent if needed.  Power of attorney is Julian Reil at [phone]   -I discussed the plan in detail with Dr. Hortencia Pilar MD and he is in agreement with the plan.   Drema Pry [facility] 02/19/2023 3:39 PM

## 2023-02-19 NOTE — Plan of Care (Signed)
Neurology plan of care  Please see consult note from Dr. Cheral Marker signed 02/16/23 for initial findings and recommendations as well as my plan of care from yesterday for updated recommendations.  This is an 82 year old male with past medical history of alcohol abuse, dementia, hyperlipidemia, hypertension, seizures (on Keppra 500 mg twice daily), prior strokes, atrial fibrillation (on Eliquis 5 mg twice daily prior to admission) who was brought in by caregiver for 2 to 3 days of speech changes as well as generally not acting like himself.  He also had right arm weakness.  MRI brain confirmed very small/punctate acute infarcts scattered within the cortex of the anterior left frontal lobe, posterior left frontal lobe, and left parietal lobe.  Etiology of stroke was favored to be embolic.  Due to the small size of the strokes it was initially felt that Eliquis could be continued for secondary stroke prevention due to low risk of hemorrhagic conversion and the fact that patient was already 3 days out from symptom onset.  Patient remains n.p.o. secondary to dysphagia therefore he has been on therapeutic Lovenox for anticoagulation.  EEG showed no evidence of seizure activity.   Remainder of stroke workup has now been completed:  TTE 1. Left ventricular ejection fraction, by estimation, is 70 to 75% . The left ventricle has hyperdynamic function. The left ventricle has no regional wall motion abnormalities. Left ventricular diastolic parameters were normal.  2. Right ventricular systolic function is normal. The right ventricular size is normal.  3. The mitral valve is normal in structure. No evidence of mitral valve regurgitation.  4. The aortic valve is normal in structure. Aortic valve regurgitation is not visualized.  5. Agitated saline contrast bubble study was negative, with no evidence of any interatrial shunt.  CTA H&N 1. The small acute infarcts seen on the recent brain MRI are not well seen on the  current study. No new acute intracranial pathology. 2. Stable CTA head/neck since 06/27/2022 with no new emergent finding. 3. Unchanged bulky plaque in the distal left common carotid artery resulting in high-grade stenosis. Calcified plaque at the right carotid bifurcation without hemodynamically significant stenosis, and moderate stenosis of the origin of the right vertebral artery. 4. Unchanged calcified plaque in the carotid siphons without high-grade stenosis, and diminutive right V4 segment.  CNS imaging personally reviewed I agree with above interpretation   Final recommendations:  # Acute ischemic stroke - More than 48 hours from symptom onset goal is normotension, avoid hypotension. - Anticoagulation may be changed from therapeutic lovenox to eliquis when patient is able to take po.  From a neurostandpoint there is no indication for antiplatelets in addition to therapeutic anticoagulation. If there is cardiac or other indication for this antiplatelet may be continued. - Continue home atorvastatin 20mg  daily (LDL is at goal) - q4 hr neuro checks - STAT head CT for any change in neuro exam - Tele - PT/OT/SLP - Stroke education - Patient may f/u with his established outpatient neurologist Dr. Manuella Ghazi after discharge  # L carotid stenosis, high-grade, unchanged from prior - I would recommend consulting vascular surgery about his high grade L carotid stenosis. They may wish to see him as an outpatient which would be fine since intervention should not be performed in the post stroke period. He has baseline dementia and is anticoagulated so they may elect to do nothing, which would also be reasonable. It would be helpful to have them comment either way.  # Seizure disorder - Continue keppra 500mg   bid  Stroke workup is otherwise completed. Neurology will be available prn for questions going forward.  Su Monks, MD [facility] [phone]  If 7pm- 7am, please page  neurology on call as listed in [place].

## 2023-02-19 NOTE — TOC Progression Note (Signed)
Transition of Care [facility]) - Progression Note    Patient Details  Name: Richard Carter MRN: [id number] Date of Birth: 1941/11/19  [facility] CM/SW Contact  Gerilyn Pilgrim, LCSW Phone Number: 02/19/2023, 2:22 PM  Clinical Narrative:   [facility] accepted for [facility] PT/OT/Speech/aid.          Expected Discharge Plan and Services                                               Social Determinants of Health (SDOH) Interventions SDOH Screenings   Food Insecurity: No Food Insecurity (02/16/2023)  Housing: Low Risk  (02/16/2023)  Transportation Needs: No Transportation Needs (02/16/2023)  Utilities: Not At Risk (02/16/2023)  Tobacco Use: Medium Risk (02/16/2023)    Readmission Risk Interventions    04/21/2022    1:55 PM  Readmission Risk Prevention Plan  Post Dischage Appt Complete  Medication Screening Complete  Transportation Screening Complete

## 2023-02-19 NOTE — Progress Notes (Signed)
Nutrition Follow-up  DOCUMENTATION CODES:   Not applicable  INTERVENTION:   -Magic cup BID with meals, each supplement provides 290 kcal and 9 grams of protein  -MVI with minerals daily -Nepro Shake po BID, each supplement provides 425 kcal and 19 grams protein   NUTRITION DIAGNOSIS:   Inadequate oral intake related to dysphagia as evidenced by NPO status.  Progressing; advanced to PO diet on 02/18/23  GOAL:   Patient will meet greater than or equal to 90% of their needs  Progressing   MONITOR:   PO intake, Supplement acceptance, Diet advancement  REASON FOR ASSESSMENT:   Consult Assessment of nutrition requirement/status  ASSESSMENT:   Pt with history of hypertension, paroxysmal atrial fibrillation on Eliquis, hyperlipidemia mixed Alzheimer's and vascular dementia, history of recurrent CVA with known right arm weakness, who presents for chief concerns of mental status change, severe dysarthria.  3/26- s/p BSE- advanced to dysphagia 1 diet with nectar thick liquids   Reviewed I/O's: +425 ml x 24 hours and +3 L since admission   Pt sitting up in bed, working with therapy at time of visit. Pt just advanced to diet yesterday. No meal completion data available to assess at this time. RD will add supplements to assist with nutritional adequacy.   Per therapy notes, recommending intermittent supervision and assistance.   Medications reviewed and include keppra and dextrose 5%-0.45% sodium chloride infusion @ 50 ml/hr.   Labs reviewed.   Diet Order:   Diet Order             DIET - DYS 1 Room service appropriate? Yes with Assist; Fluid consistency: Nectar Thick  Diet effective now                   EDUCATION NEEDS:   Education needs have been addressed  Skin:  Skin Assessment: Reviewed RN Assessment  Last BM:  Unknown  Height:   Ht Readings from Last 1 Encounters:  02/16/23 5\' 6"  (1.676 m)    Weight:   Wt Readings from Last 1 Encounters:  02/16/23  71 kg    Ideal Body Weight:  64.5 kg  BMI:  Body mass index is 25.26 kg/m.  Estimated Nutritional Needs:   Kcal:  [id number]  Protein:  90-105 grams  Fluid:  > 1.7 L    Loistine Chance, RD, LDN, [place] Registered Dietitian II Certified Diabetes Care and Education Specialist Please refer to [facility] for RD and/or RD on-call/weekend/after hours pager

## 2023-02-19 NOTE — Plan of Care (Signed)

## 2023-02-20 DIAGNOSIS — R2990 Unspecified symptoms and signs involving the nervous system: Secondary | ICD-10-CM | POA: Diagnosis not present

## 2023-02-20 DIAGNOSIS — R4182 Altered mental status, unspecified: Secondary | ICD-10-CM

## 2023-02-20 DIAGNOSIS — I6522 Occlusion and stenosis of left carotid artery: Secondary | ICD-10-CM

## 2023-02-20 LAB — CBC WITH DIFFERENTIAL/PLATELET
Abs Immature Granulocytes: 0.02 10*3/uL (ref 0.00–0.07)
Basophils Absolute: 0 10*3/uL (ref 0.0–0.1)
Basophils Relative: 1 %
Eosinophils Absolute: 0.3 10*3/uL (ref 0.0–0.5)
Eosinophils Relative: 5 %
HCT: 25.5 % — ABNORMAL LOW (ref 39.0–52.0)
Hemoglobin: 8 g/dL — ABNORMAL LOW (ref 13.0–17.0)
Immature Granulocytes: 0 %
Lymphocytes Relative: 20 %
Lymphs Abs: 1.3 10*3/uL (ref 0.7–4.0)
MCH: 27 pg (ref 26.0–34.0)
MCHC: 31.4 g/dL (ref 30.0–36.0)
MCV: 86.1 fL (ref 80.0–100.0)
Monocytes Absolute: 0.9 10*3/uL (ref 0.1–1.0)
Monocytes Relative: 14 %
Neutro Abs: 3.9 10*3/uL (ref 1.7–7.7)
Neutrophils Relative %: 60 %
Platelets: 241 10*3/uL (ref 150–400)
RBC: 2.96 MIL/uL — ABNORMAL LOW (ref 4.22–5.81)
RDW: 15.4 % (ref 11.5–15.5)
WBC: 6.4 10*3/uL (ref 4.0–10.5)
nRBC: 0 % (ref 0.0–0.2)

## 2023-02-20 LAB — BASIC METABOLIC PANEL
Anion gap: 7 (ref 5–15)
BUN: 24 mg/dL — ABNORMAL HIGH (ref 8–23)
CO2: 21 mmol/L — ABNORMAL LOW (ref 22–32)
Calcium: 9.2 mg/dL (ref 8.9–10.3)
Chloride: 109 mmol/L (ref 98–111)
Creatinine, Ser: 1.11 mg/dL (ref 0.61–1.24)
GFR, Estimated: >60 mL/min (ref >60)
Glucose, Bld: 97 mg/dL (ref 70–99)
Potassium: 3.9 mmol/L (ref 3.5–5.1)
Sodium: 137 mmol/L (ref 135–145)

## 2023-02-20 MED ORDER — PANTOPRAZOLE SODIUM 40 MG PO TBEC
40.0000 mg | DELAYED_RELEASE_TABLET | Freq: Every day | ORAL | Status: DC
  Administered 2023-02-20: 40 mg via ORAL
  Filled 2023-02-20: qty 1

## 2023-02-20 NOTE — Progress Notes (Signed)
Physical Therapy Treatment Patient Details Name: Richard Carter MRN: [id number] DOB: 1941/05/22 Today's Date: 02/20/2023   History of Present Illness Pt is an 82 year old male with history of hypertension, paroxysmal atrial fibrillation on Eliquis, hyperlipidemia mixed Alzheimer's and vascular dementia, 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 MD assessment includes: Stroke-like symptom with MR impression "small acute infarcts in the cortex of the anterior left frontal lobe, posterior left frontal lobe, and left parietal lobe", seizures, HTN, leukocytosis, and severe protein-calorie malnutrition.    PT Comments    Patient alert, up in bathroom with techs upon PT entrance. With some encouragement he agreed to ambulate after being cleaned up. Sit <> stand with RW and CGA, extended time and cues for RW hand placement needed. Pt noted for decreased RLE step length and coordination, improvement noted with consistent tactile and verbal cues. He was also able to complete several seated exercises with tactile and visual cues. The patient would benefit from further skilled PT intervention to continue to progress towards goals.        Recommendations for follow up therapy are one component of a multi-disciplinary discharge planning process, led by the attending physician.  Recommendations may be updated based on patient status, additional functional criteria and insurance authorization.  Follow Up Recommendations       Assistance Recommended at Discharge Intermittent Supervision/Assistance  Patient can return home with the following A little help with walking and/or transfers;A little help with bathing/dressing/bathroom;Assistance with cooking/housework;Direct supervision/assist for medications management;Help with stairs or ramp for entrance;Assist for transportation   Equipment  Recommendations  None recommended by PT    Recommendations for Other Services       Precautions / Restrictions Precautions Precautions: Fall Precaution Comments: seizure precautions Restrictions Weight Bearing Restrictions: No     Mobility  Bed Mobility               General bed mobility comments: pt up in bathroom with techs upon arrival    Transfers Overall transfer level: Needs assistance Equipment used: Rolling walker (2 wheels) Transfers: Sit to/from Stand Sit to Stand: Min guard           General transfer comment: verbal cues for hand placement, extra time to increase independence    Ambulation/Gait Ambulation/Gait assistance: Min guard Gait Distance (Feet): 70 Feet Assistive device: Rolling walker (2 wheels) Gait Pattern/deviations: Step-through pattern, Decreased step length - right, Decreased step length - left, Trunk flexed       General Gait Details: consistent tactile and verbal cues to improve RLE step length/foot clearance   Stairs             Wheelchair Mobility    Modified Rankin (Stroke Patients Only)       Balance Overall balance assessment: Needs assistance Sitting-balance support: Bilateral upper extremity supported, Feet unsupported Sitting balance-Leahy Scale: Fair     Standing balance support: Bilateral upper extremity supported, During functional activity, Reliant on assistive device for balance Standing balance-Leahy Scale: Fair                              Cognition Arousal/Alertness: Awake/alert Behavior During Therapy: WFL for tasks assessed/performed Overall Cognitive Status: History of cognitive impairments - at baseline  Exercises Total Joint Exercises Ankle Circles/Pumps: AROM, Strengthening, Both, 20 reps Hip ABduction/ADduction: Strengthening, Both, 15 reps, AROM Long Arc Quad: AROM, Strengthening, Both, 15 reps    General  Comments        Pertinent Vitals/Pain Pain Assessment Pain Assessment: No/denies pain    Home Living                          Prior Function            PT Goals (current goals can now be found in the care plan section) Progress towards PT goals: Progressing toward goals    Frequency    Min 2X/week      PT Plan Current plan remains appropriate    Co-evaluation              AM-PAC PT "6 Clicks" Mobility   Outcome Measure  Help needed turning from your back to your side while in a flat bed without using bedrails?: None Help needed moving from lying on your back to sitting on the side of a flat bed without using bedrails?: A Little Help needed moving to and from a bed to a chair (including a wheelchair)?: A Little Help needed standing up from a chair using your arms (e.g., wheelchair or bedside chair)?: A Little Help needed to walk in hospital room?: A Little Help needed climbing 3-5 steps with a railing? : A Little 6 Click Score: 19    End of Session Equipment Utilized During Treatment: Gait belt Activity Tolerance: Patient tolerated treatment well Patient left: with call bell/phone within reach;in chair;with chair alarm set Nurse Communication: Mobility status PT Visit Diagnosis: Difficulty in walking, not elsewhere classified (R26.2);Muscle weakness (generalized) (M62.81);Unsteadiness on feet (R26.81)     Time: [id number] PT Time Calculation (min) (ACUTE ONLY): 17 min  Charges:  $Therapeutic Activity: 8-22 mins                     Lieutenant Diego PT, DPT 3:12 PM,02/20/23

## 2023-02-20 NOTE — Care Management Important Message (Signed)
Important Message  Patient Details  Name: Richard Carter MRN: [id number] Date of Birth: [birth year]   Medicare Important Message Given:  Yes     [name] 02/20/2023, 12:14 PM

## 2023-02-20 NOTE — Progress Notes (Signed)
Progress Note   Patient: Richard Carter [id number] DOB: 02-18-1941 DOA: 02/16/2023     3 DOS: the patient was seen and examined on 02/20/2023    Subjective:  Patient seen and examined at bedside this morning Denies nausea vomiting and abdominal pain chest pain  Tolerating oral feeling   Brief hospital course: [name] is a 82 year old male with history of hypertension, paroxysmal atrial fibrillation on Eliquis, hyperlipidemia mixed Alzheimer's and vascular dementia, 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 MRI of the brain showed findings of Small acute infarcts in the cortex of the anterior left frontal lobe, posterior left frontal lobe, and left parietal lobe.   Assessment and Plan:   Acute ischemic stroke Acute metabolic encephalopathy secondary to above Patient has new right-sided facial droop on presentation MRI of the brain showed findings of acute stroke A1c 5.7 Neurology is on board and EEG showed no findings of encephalopathy but no seizures Frequent neuro vascular checks Patient has been cleared by speech therapist to be able to take p.o. We will continue on the patient's home dose of Eliquis From stroke standpoint neurology does not recommend antiplatelet at this time except for continuation of Eliquis Continue PT, OT, SLP Fall precaution and aspiration precaution   High-grade left carotid artery stenosis Vascular surgeon has been consulted for his input Hopefully after vascular surgeons input patient be ready for discharge  Sepsis has been ruled out. Patient only met one of the SIRS criteria which is leukocytosis of 16.9 on presentation. UTI present on admission Antibiotics were subsequently de-escalated to ceftriaxone   AF (paroxysmal atrial fibrillation) (HCC) Eliquis 5 mg p.o.  Currently rate controlled   Seizures (HCC) Seizure  precaution Continue Keppra   HTN (hypertension) Management as above Continue current home medications as blood pressure allows   Leukocytosis due to UTI Continue current antibiotics Vancomycin discontinued Cefepime switched to ceftriaxone     Dysphagia Aspiration precaution ST on board   Protein-calorie malnutrition, severe Consult placed to registered dietitian   History of stroke Fall precaution   GERD (gastroesophageal reflux disease) PPI resumed   HLD (hyperlipidemia) Atorvastatin 20 mg nightly    Chart reviewed.  CTA head and neck showing high-grade stenosis in the left carotid artery   DVT prophylaxis: Continue Eliquis Code Status: DNR Diet: Pending SLP evaluation.   Family Communication: updated Richard Carter, who is the primary caregiver and healthcare power of attorney    Consults called: Neurology by EDP     Physical Exam:   Constitutional: appears age-appropriate, frail, NAD, calm, comfortable Eyes: PERRL, lids and conjunctivae normal HENMT: Has right-sided facial droop. Neck: normal, supple, no masses, no thyromegaly Respiratory: clear to auscultation bilaterally, no wheezing, Cardiovascular: Regular rate and rhythm, no murmurs / rubs / gallops.  Abdomen: no tenderness,  Musculoskeletal: no clubbing /contracture deformity of the left middle finger Skin: no rashes, lesions, ulcers. No induration Neurologic: Sensation intact. Strength 5/5 in all 4.  Dysarthria     Data Reviewed: wBC has normalized with WBC of 6.4 today hemoglobin of 8.0  Family Communication: Discussed with patient's sister and patient's son  Disposition: Patient meets inpatient criteria given his  acute stroke   Planned Discharge Destination: Pending clinical course after vascular surgery eval    I spent total of 35 minutes taking care of this patient  Vitals:   02/20/23 0519 02/20/23 0630 02/20/23 0824 02/20/23 1123  BP: (!) 146/78 Marland Kitchen)  142/64 (!) 158/70 138/83  Pulse: (!) 58 (!)  53 62 (!) 59  Resp: 20 18 18 17   Temp: 97.8 F (36.6 C)  97.7 F (36.5 C) 97.6 F (36.4 C)  TempSrc:   Oral Oral  SpO2: 99% 100% 95% 98%  Weight:      Height:         Author: Verline Lema, MD 02/20/2023 1:21 PM  For on call review [email or web address].

## 2023-02-20 NOTE — Progress Notes (Signed)
PHARMACIST - PHYSICIAN COMMUNICATION  CONCERNING: IV to Oral Route Change Policy  RECOMMENDATION: This patient is receiving Protonix by the intravenous route.  Based on criteria approved by the Pharmacy and Therapeutics Committee, the intravenous medication(s) is/are being converted to the equivalent oral dose form(s).   DESCRIPTION: These criteria include: The patient is eating (either orally or via tube) and/or has been taking other orally administered medications for a least 24 hours The patient has no evidence of active gastrointestinal bleeding or impaired GI absorption (gastrectomy, short bowel, patient on TNA or NPO).  If you have questions about this conversion, please contact the Pharmacy Department  []   [phone] )  [facility] [x]   [phone] )  [facility] []   [phone] )  [facility] []   [phone] )  [facility] []   [phone] )  [place], [facility] 02/20/2023 10:37 AM

## 2023-02-21 DIAGNOSIS — R2990 Unspecified symptoms and signs involving the nervous system: Secondary | ICD-10-CM | POA: Diagnosis not present

## 2023-02-21 LAB — CULTURE, BLOOD (ROUTINE X 2)
Culture: NO GROWTH
Culture: NO GROWTH
Special Requests: ADEQUATE

## 2023-02-21 MED ORDER — AMOXICILLIN-POT CLAVULANATE 875-125 MG PO TABS: 1.0000 | ORAL_TABLET | Freq: Two times a day (BID) | ORAL | 0 refills | Status: AC

## 2023-02-21 MED ORDER — AMOXICILLIN-POT CLAVULANATE 875-125 MG PO TABS
1.0000 | ORAL_TABLET | Freq: Two times a day (BID) | ORAL | Status: DC
  Administered 2023-02-21: 1 via ORAL
  Filled 2023-02-21: qty 1

## 2023-02-21 NOTE — Discharge Summary (Signed)
Physician Discharge Summary   Patient: Richard Carter MRN: [id number] DOB: 12/04/1940  Admit date:     02/16/2023  Discharge date: 02/21/23  Discharge Physician: Verline Lema   PCP: Dion Body, MD   Recommendations at discharge:  Follow-up with cardiologist as well as neurologist for clearance for youRendarterectomy  Discharge Diagnoses: Acute ischemic stroke High-grade left carotid artery stenosis Sepsis has been ruled out. AF (paroxysmal atrial fibrillation) (HCC) Seizures (HCC) HTN (hypertension) Leukocytosis due to UTI Dysphagia Protein-calorie malnutrition, severe History of stroke GERD (gastroesophageal reflux disease) HLD (hyperlipidemia)  Hospital Course: [name] is a 82 year old male with history of hypertension, paroxysmal atrial fibrillation on Eliquis, hyperlipidemia mixed Alzheimer's and vascular dementia, history of recurrent CVA with known right arm weakness, who presents emergency department for chief concerns of mental status change, severe dysarthria and right facial droop. MRI of the brain showed findings of Small acute infarcts in the cortex of the anterior left frontal lobe, posterior left frontal lobe, and left parietal lobe.  Imaging of head and neck showed high-grade left carotid artery stenosis.  Patient was evaluated by vascular surgeon and being planned for outpatient endarterectomy.  In the setting of acute stroke vascular surgery plans to do this in 3 to 4 weeks time and would need cardiology clearance as well as neurology clearance.  Patient evaluated by speech therapy, PT OT and has been recommended for home health.  Being discharged today to follow-up with above-mentioned specialist    Consultants: Vascular surgery, neurology, Procedures performed: None Disposition: Home health Diet recommendation:  Discharge Diet Orders (From admission, onward)     Start     Ordered   02/21/23 0000  Diet - low sodium heart healthy         02/21/23 1143           Cardiac diet DISCHARGE MEDICATION: Allergies as of 02/21/2023   No Known Allergies      Medication List     TAKE these medications    amLODipine 5 MG tablet Commonly known as: NORVASC Take 1 tablet (5 mg total) by mouth daily.   amoxicillin-clavulanate 875-125 MG tablet Commonly known as: AUGMENTIN Take 1 tablet by mouth every 12 (twelve) hours for 5 days.   apixaban 5 MG Tabs tablet Commonly known as: ELIQUIS Take 1 tablet (5 mg total) by mouth 2 (two) times daily.   atorvastatin 20 MG tablet Commonly known as: LIPITOR Take 20 mg by mouth every evening.   food thickener Gel Commonly known as: SIMPLYTHICK (NECTAR/LEVEL 2/MILDLY THICK) Take 1 packet by mouth as needed.   levETIRAcetam 500 MG tablet Commonly known as: KEPPRA Take 1 tablet (500 mg total) by mouth 2 (two) times daily.   losartan 50 MG tablet Commonly known as: COZAAR Take 50 mg by mouth daily.   metoprolol succinate 25 MG 24 hr tablet Commonly known as: TOPROL-XL Take 0.5 tablets (12.5 mg total) by mouth daily.   pantoprazole 40 MG tablet Commonly known as: Protonix Take 1 tablet (40 mg total) by mouth 2 (two) times daily. After sixty days the patient is to continue protonix at 40 mg daily. What changed:  when to take this additional instructions   traZODone 100 MG tablet Commonly known as: DESYREL Take 1 tablet (100 mg total) by mouth at bedtime.        Follow-up Information     Schnier, Dolores Lory, MD. Schedule an appointment as soon as possible for a visit.   Specialties: Vascular Surgery, Cardiology,  Radiology, Vascular Surgery Contact information: [place] [address] [id number]         Minna Merritts, MD .   Specialty: Cardiology Contact information: [address] [place] [address] [phone]         [place] .   Contact information: [place] [phone]               Discharge Exam: Danley Danker Weights   02/16/23 1719  Weight: 71 kg   Constitutional: appears age-appropriate, frail, NAD, calm, comfortable Eyes: PERRL, lids and conjunctivae normal HENMT: Has right-sided facial droop. Neck: normal, supple, no masses, no thyromegaly Respiratory: clear to auscultation bilaterally, no wheezing, Cardiovascular: Regular rate and rhythm, no murmurs / rubs / gallops.  Abdomen: no tenderness,  Musculoskeletal: no clubbing /contracture deformity of the left middle finger Skin: no rashes, lesions, ulcers. No induration Neurologic: Sensation intact. Strength 5/5 in all 4.  Dysarthria    Condition at discharge: good  Discharge time spent: greater than 30 minutes.  Signed: Verline Lema, MD [facility] 02/21/2023

## 2023-02-21 NOTE — TOC Transition Note (Signed)
Transition of Care [facility]) - CM/SW Discharge Note   Patient Details  Name: Richard Carter MRN: [id number] Date of Birth: Feb 08, 1941  [facility] of Care [facility]) CM/SW Contact:  Gerilyn Pilgrim, LCSW Phone Number: 02/21/2023, 11:46 AM   Clinical Narrative:   Pt to discharge today with [facility] PT/OT/RN/Speech. [place] with [facility] notified of discharge. CSW signing off.           Patient Goals and CMS Choice      Discharge Placement                         Discharge Plan and Services Additional resources added to the After Visit Summary for                                       Social Determinants of Health (SDOH) Interventions SDOH Screenings   Food Insecurity: No Food Insecurity (02/16/2023)  Housing: Low Risk  (02/16/2023)  Transportation Needs: No Transportation Needs (02/16/2023)  Utilities: Not At Risk (02/16/2023)  Tobacco Use: Medium Risk (02/16/2023)     Readmission Risk Interventions    04/21/2022    1:55 PM  Readmission Risk Prevention Plan  Post Dischage Appt Complete  Medication Screening Complete  Transportation Screening Complete

## 2023-02-24 ENCOUNTER — Telehealth: Payer: Self-pay | Admitting: Cardiology

## 2023-02-24 DIAGNOSIS — I480 Paroxysmal atrial fibrillation: Secondary | ICD-10-CM | POA: Diagnosis not present

## 2023-02-24 DIAGNOSIS — I10 Essential (primary) hypertension: Secondary | ICD-10-CM | POA: Diagnosis not present

## 2023-02-24 DIAGNOSIS — G309 Alzheimer's disease, unspecified: Secondary | ICD-10-CM | POA: Diagnosis not present

## 2023-02-24 DIAGNOSIS — F0280 Dementia in other diseases classified elsewhere without behavioral disturbance: Secondary | ICD-10-CM | POA: Diagnosis not present

## 2023-02-24 DIAGNOSIS — I6522 Occlusion and stenosis of left carotid artery: Secondary | ICD-10-CM | POA: Diagnosis not present

## 2023-02-24 DIAGNOSIS — N390 Urinary tract infection, site not specified: Secondary | ICD-10-CM | POA: Diagnosis not present

## 2023-02-24 DIAGNOSIS — I2510 Atherosclerotic heart disease of native coronary artery without angina pectoris: Secondary | ICD-10-CM | POA: Diagnosis not present

## 2023-02-24 DIAGNOSIS — I69351 Hemiplegia and hemiparesis following cerebral infarction affecting right dominant side: Secondary | ICD-10-CM | POA: Diagnosis not present

## 2023-02-24 DIAGNOSIS — G40909 Epilepsy, unspecified, not intractable, without status epilepticus: Secondary | ICD-10-CM | POA: Diagnosis not present

## 2023-02-24 NOTE — Telephone Encounter (Signed)
Talked with Richard Carter, regarding patient's new patient appointment on 02/25/23. Patient sees [facility] for cardiology, and wants to keep appointment with [facility]. Patient needs cardiac clearance, and patient's POA will contact [facility] and call us back and let us know if he wants to keep the appointment with our office.

## 2023-02-25 ENCOUNTER — Ambulatory Visit: Payer: Medicare HMO | Admitting: Cardiology

## 2023-02-26 DIAGNOSIS — I10 Essential (primary) hypertension: Secondary | ICD-10-CM | POA: Diagnosis not present

## 2023-02-26 DIAGNOSIS — E7800 Pure hypercholesterolemia, unspecified: Secondary | ICD-10-CM | POA: Diagnosis not present

## 2023-02-26 DIAGNOSIS — I480 Paroxysmal atrial fibrillation: Secondary | ICD-10-CM | POA: Diagnosis not present

## 2023-02-26 DIAGNOSIS — F0150 Vascular dementia without behavioral disturbance: Secondary | ICD-10-CM | POA: Diagnosis not present

## 2023-02-26 DIAGNOSIS — Z8673 Personal history of transient ischemic attack (TIA), and cerebral infarction without residual deficits: Secondary | ICD-10-CM | POA: Diagnosis not present

## 2023-02-26 DIAGNOSIS — F0280 Dementia in other diseases classified elsewhere without behavioral disturbance: Secondary | ICD-10-CM | POA: Diagnosis not present

## 2023-02-26 DIAGNOSIS — R569 Unspecified convulsions: Secondary | ICD-10-CM | POA: Diagnosis not present

## 2023-02-26 DIAGNOSIS — G309 Alzheimer's disease, unspecified: Secondary | ICD-10-CM | POA: Diagnosis not present

## 2023-02-26 DIAGNOSIS — R001 Bradycardia, unspecified: Secondary | ICD-10-CM | POA: Diagnosis not present

## 2023-02-27 DIAGNOSIS — R569 Unspecified convulsions: Secondary | ICD-10-CM | POA: Diagnosis not present

## 2023-02-27 DIAGNOSIS — I10 Essential (primary) hypertension: Secondary | ICD-10-CM | POA: Diagnosis not present

## 2023-02-27 DIAGNOSIS — F0150 Vascular dementia without behavioral disturbance: Secondary | ICD-10-CM | POA: Diagnosis not present

## 2023-02-27 DIAGNOSIS — Z8673 Personal history of transient ischemic attack (TIA), and cerebral infarction without residual deficits: Secondary | ICD-10-CM | POA: Diagnosis not present

## 2023-02-27 DIAGNOSIS — N390 Urinary tract infection, site not specified: Secondary | ICD-10-CM | POA: Diagnosis not present

## 2023-02-27 DIAGNOSIS — I6522 Occlusion and stenosis of left carotid artery: Secondary | ICD-10-CM | POA: Diagnosis not present

## 2023-02-27 DIAGNOSIS — I69351 Hemiplegia and hemiparesis following cerebral infarction affecting right dominant side: Secondary | ICD-10-CM | POA: Diagnosis not present

## 2023-02-27 DIAGNOSIS — F0280 Dementia in other diseases classified elsewhere without behavioral disturbance: Secondary | ICD-10-CM | POA: Diagnosis not present

## 2023-02-27 DIAGNOSIS — I480 Paroxysmal atrial fibrillation: Secondary | ICD-10-CM | POA: Diagnosis not present

## 2023-02-27 DIAGNOSIS — G309 Alzheimer's disease, unspecified: Secondary | ICD-10-CM | POA: Diagnosis not present

## 2023-02-27 DIAGNOSIS — G40909 Epilepsy, unspecified, not intractable, without status epilepticus: Secondary | ICD-10-CM | POA: Diagnosis not present

## 2023-02-27 DIAGNOSIS — I2510 Atherosclerotic heart disease of native coronary artery without angina pectoris: Secondary | ICD-10-CM | POA: Diagnosis not present

## 2023-03-03 DIAGNOSIS — R569 Unspecified convulsions: Secondary | ICD-10-CM | POA: Diagnosis not present

## 2023-03-03 DIAGNOSIS — I10 Essential (primary) hypertension: Secondary | ICD-10-CM | POA: Diagnosis not present

## 2023-03-03 DIAGNOSIS — E7800 Pure hypercholesterolemia, unspecified: Secondary | ICD-10-CM | POA: Diagnosis not present

## 2023-03-03 DIAGNOSIS — Z8673 Personal history of transient ischemic attack (TIA), and cerebral infarction without residual deficits: Secondary | ICD-10-CM | POA: Diagnosis not present

## 2023-03-03 DIAGNOSIS — I6522 Occlusion and stenosis of left carotid artery: Secondary | ICD-10-CM | POA: Diagnosis not present

## 2023-03-03 DIAGNOSIS — G309 Alzheimer's disease, unspecified: Secondary | ICD-10-CM | POA: Diagnosis not present

## 2023-03-03 DIAGNOSIS — F0150 Vascular dementia without behavioral disturbance: Secondary | ICD-10-CM | POA: Diagnosis not present

## 2023-03-03 DIAGNOSIS — F0280 Dementia in other diseases classified elsewhere without behavioral disturbance: Secondary | ICD-10-CM | POA: Diagnosis not present

## 2023-03-03 DIAGNOSIS — Z862 Personal history of diseases of the blood and blood-forming organs and certain disorders involving the immune mechanism: Secondary | ICD-10-CM | POA: Diagnosis not present

## 2023-03-06 DIAGNOSIS — G309 Alzheimer's disease, unspecified: Secondary | ICD-10-CM | POA: Diagnosis not present

## 2023-03-06 DIAGNOSIS — I69351 Hemiplegia and hemiparesis following cerebral infarction affecting right dominant side: Secondary | ICD-10-CM | POA: Diagnosis not present

## 2023-03-06 DIAGNOSIS — I10 Essential (primary) hypertension: Secondary | ICD-10-CM | POA: Diagnosis not present

## 2023-03-06 DIAGNOSIS — I6522 Occlusion and stenosis of left carotid artery: Secondary | ICD-10-CM | POA: Diagnosis not present

## 2023-03-06 DIAGNOSIS — I480 Paroxysmal atrial fibrillation: Secondary | ICD-10-CM | POA: Diagnosis not present

## 2023-03-06 DIAGNOSIS — F0280 Dementia in other diseases classified elsewhere without behavioral disturbance: Secondary | ICD-10-CM | POA: Diagnosis not present

## 2023-03-06 DIAGNOSIS — G40909 Epilepsy, unspecified, not intractable, without status epilepticus: Secondary | ICD-10-CM | POA: Diagnosis not present

## 2023-03-06 DIAGNOSIS — I2510 Atherosclerotic heart disease of native coronary artery without angina pectoris: Secondary | ICD-10-CM | POA: Diagnosis not present

## 2023-03-06 DIAGNOSIS — N390 Urinary tract infection, site not specified: Secondary | ICD-10-CM | POA: Diagnosis not present

## 2023-03-07 DIAGNOSIS — N390 Urinary tract infection, site not specified: Secondary | ICD-10-CM | POA: Diagnosis not present

## 2023-03-07 DIAGNOSIS — I6522 Occlusion and stenosis of left carotid artery: Secondary | ICD-10-CM | POA: Diagnosis not present

## 2023-03-07 DIAGNOSIS — I10 Essential (primary) hypertension: Secondary | ICD-10-CM | POA: Diagnosis not present

## 2023-03-07 DIAGNOSIS — I2510 Atherosclerotic heart disease of native coronary artery without angina pectoris: Secondary | ICD-10-CM | POA: Diagnosis not present

## 2023-03-07 DIAGNOSIS — G40909 Epilepsy, unspecified, not intractable, without status epilepticus: Secondary | ICD-10-CM | POA: Diagnosis not present

## 2023-03-07 DIAGNOSIS — I69351 Hemiplegia and hemiparesis following cerebral infarction affecting right dominant side: Secondary | ICD-10-CM | POA: Diagnosis not present

## 2023-03-07 DIAGNOSIS — G309 Alzheimer's disease, unspecified: Secondary | ICD-10-CM | POA: Diagnosis not present

## 2023-03-07 DIAGNOSIS — I480 Paroxysmal atrial fibrillation: Secondary | ICD-10-CM | POA: Diagnosis not present

## 2023-03-07 DIAGNOSIS — F0280 Dementia in other diseases classified elsewhere without behavioral disturbance: Secondary | ICD-10-CM | POA: Diagnosis not present

## 2023-03-11 DIAGNOSIS — I6522 Occlusion and stenosis of left carotid artery: Secondary | ICD-10-CM | POA: Diagnosis not present

## 2023-03-11 DIAGNOSIS — N390 Urinary tract infection, site not specified: Secondary | ICD-10-CM | POA: Diagnosis not present

## 2023-03-11 DIAGNOSIS — G309 Alzheimer's disease, unspecified: Secondary | ICD-10-CM | POA: Diagnosis not present

## 2023-03-11 DIAGNOSIS — I10 Essential (primary) hypertension: Secondary | ICD-10-CM | POA: Diagnosis not present

## 2023-03-11 DIAGNOSIS — F0280 Dementia in other diseases classified elsewhere without behavioral disturbance: Secondary | ICD-10-CM | POA: Diagnosis not present

## 2023-03-11 DIAGNOSIS — G40909 Epilepsy, unspecified, not intractable, without status epilepticus: Secondary | ICD-10-CM | POA: Diagnosis not present

## 2023-03-11 DIAGNOSIS — I2510 Atherosclerotic heart disease of native coronary artery without angina pectoris: Secondary | ICD-10-CM | POA: Diagnosis not present

## 2023-03-11 DIAGNOSIS — I69351 Hemiplegia and hemiparesis following cerebral infarction affecting right dominant side: Secondary | ICD-10-CM | POA: Diagnosis not present

## 2023-03-11 DIAGNOSIS — I480 Paroxysmal atrial fibrillation: Secondary | ICD-10-CM | POA: Diagnosis not present

## 2023-03-12 DIAGNOSIS — G309 Alzheimer's disease, unspecified: Secondary | ICD-10-CM | POA: Diagnosis not present

## 2023-03-12 DIAGNOSIS — I2510 Atherosclerotic heart disease of native coronary artery without angina pectoris: Secondary | ICD-10-CM | POA: Diagnosis not present

## 2023-03-12 DIAGNOSIS — I10 Essential (primary) hypertension: Secondary | ICD-10-CM | POA: Diagnosis not present

## 2023-03-12 DIAGNOSIS — N390 Urinary tract infection, site not specified: Secondary | ICD-10-CM | POA: Diagnosis not present

## 2023-03-12 DIAGNOSIS — I480 Paroxysmal atrial fibrillation: Secondary | ICD-10-CM | POA: Diagnosis not present

## 2023-03-12 DIAGNOSIS — G40909 Epilepsy, unspecified, not intractable, without status epilepticus: Secondary | ICD-10-CM | POA: Diagnosis not present

## 2023-03-12 DIAGNOSIS — F0280 Dementia in other diseases classified elsewhere without behavioral disturbance: Secondary | ICD-10-CM | POA: Diagnosis not present

## 2023-03-12 DIAGNOSIS — I6522 Occlusion and stenosis of left carotid artery: Secondary | ICD-10-CM | POA: Diagnosis not present

## 2023-03-12 DIAGNOSIS — I69351 Hemiplegia and hemiparesis following cerebral infarction affecting right dominant side: Secondary | ICD-10-CM | POA: Diagnosis not present

## 2023-03-13 DIAGNOSIS — I480 Paroxysmal atrial fibrillation: Secondary | ICD-10-CM | POA: Diagnosis not present

## 2023-03-13 DIAGNOSIS — N390 Urinary tract infection, site not specified: Secondary | ICD-10-CM | POA: Diagnosis not present

## 2023-03-13 DIAGNOSIS — I6522 Occlusion and stenosis of left carotid artery: Secondary | ICD-10-CM | POA: Diagnosis not present

## 2023-03-13 DIAGNOSIS — I10 Essential (primary) hypertension: Secondary | ICD-10-CM | POA: Diagnosis not present

## 2023-03-13 DIAGNOSIS — G40909 Epilepsy, unspecified, not intractable, without status epilepticus: Secondary | ICD-10-CM | POA: Diagnosis not present

## 2023-03-13 DIAGNOSIS — G309 Alzheimer's disease, unspecified: Secondary | ICD-10-CM | POA: Diagnosis not present

## 2023-03-13 DIAGNOSIS — F0280 Dementia in other diseases classified elsewhere without behavioral disturbance: Secondary | ICD-10-CM | POA: Diagnosis not present

## 2023-03-13 DIAGNOSIS — I69351 Hemiplegia and hemiparesis following cerebral infarction affecting right dominant side: Secondary | ICD-10-CM | POA: Diagnosis not present

## 2023-03-13 DIAGNOSIS — I2510 Atherosclerotic heart disease of native coronary artery without angina pectoris: Secondary | ICD-10-CM | POA: Diagnosis not present

## 2023-03-16 NOTE — Progress Notes (Signed)
MRN : [id number]  Richard Carter is a 82 y.o. ([birth year]) male who presents with chief complaint of check carotid arteries.  History of Present Illness:   The patient is seen for evaluation of carotid stenosis. The carotid stenosis was identified after experiencing an acute stroke.  He was admitted to [facility] on 02/16/2023.  The patient presents with a history of hypertension, paroxysmal atrial fibrillation on Eliquis, hyperlipidemia mixed Alzheimer's and vascular dementia, history of recurrent CVA with known right arm weakness, who presents emergency department for chief concerns of mental status change, severe dysarthria and right facial droop.  MRI of the brain showed findings of Small acute infarcts in the cortex of the anterior left frontal lobe, posterior left frontal lobe, and left parietal lobe.  Imaging of head and neck showed high-grade left carotid artery stenosis.   The patient denies amaurosis fugax. There is no recent history of TIA symptoms or focal motor deficits. There is history of several prior documented CVA.  There is no history of seizures.  The patient is taking enteric-coated aspirin 81 mg daily.  No recent shortening of the patient's walking distance or new symptoms consistent with claudication.  No history of rest pain symptoms. No new ulcers or wounds of the lower extremities have occurred.  There is no history of DVT, PE or superficial thrombophlebitis. No recent episodes of angina or shortness of breath documented.   CT angiogram of the carotid arteries dated February 18, 2023 is reviewed by me and demonstrates greater than 70% stenosis of the left common carotid and proximal left internal carotid artery less than 50% stenosis noted on the right  No outpatient medications have been marked as taking for the 03/17/23 encounter (Appointment) with Gilda Crease, Latina Craver, MD.    Past Medical History:  Diagnosis Date   Alcohol abuse     Dementia (HCC)    GERD (gastroesophageal reflux disease)    HLD (hyperlipidemia)    HTN (hypertension)    Seizures (HCC)    Stroke [facility])     Past Surgical History:  Procedure Laterality Date   skin cyst removal      Social History Social History   Tobacco Use   Smoking status: Former    Packs/day: 0.50    Years: 68.00    Additional pack years: 0.00    Total pack years: 34.00    Types: Cigarettes    Quit date: 2023    Years since quitting: 1.3   Smokeless tobacco: Never  Vaping Use   Vaping Use: Never used  Substance Use Topics   Alcohol use: Not Currently    Comment: Drinks about 4oz of liquor daily. Last drink was yesterday 04/19/22.   Drug use: Never    Family History Family History  Problem Relation Age of Onset   Stroke Mother    Cancer Brother     No Known Allergies   REVIEW OF SYSTEMS (Negative unless checked)  Constitutional: [] Weight loss  [] Fever  [] Chills Cardiac: [] Chest pain   [] Chest pressure   [] Palpitations   [] Shortness of breath when laying flat   [] Shortness of breath with exertion. Vascular:  [x] Pain in legs with walking   [] Pain in legs at rest  [] History of DVT   [] Phlebitis   [] Swelling in legs   [] Varicose veins   [] Non-healing ulcers Pulmonary:   [] Uses home oxygen   [] Productive cough   [] Hemoptysis   [] Wheeze  [] COPD   [] Asthma Neurologic:  []   Dizziness   [] Seizures   [] History of stroke   [] History of TIA  [] Aphasia   [] Vissual changes   [] Weakness or numbness in arm   [] Weakness or numbness in leg Musculoskeletal:   [] Joint swelling   [] Joint pain   [] Low back pain Hematologic:  [] Easy bruising  [] Easy bleeding   [] Hypercoagulable state   [] Anemic Gastrointestinal:  [] Diarrhea   [] Vomiting  [] Gastroesophageal reflux/heartburn   [] Difficulty swallowing. Genitourinary:  [] Chronic kidney disease   [] Difficult urination  [] Frequent urination   [] Blood in urine Skin:  [] Rashes   [] Ulcers  Psychological:  [] History of anxiety   []  History of  major depression.  Physical Examination  There were no vitals filed for this visit. There is no height or weight on file to calculate BMI. Gen: WD/WN, NAD seen in a wheelchair Head: [place]/AT, No temporalis wasting.  Ear/Nose/Throat: Hearing grossly intact, nares w/o erythema or drainage Eyes: PER, EOMI, sclera nonicteric.  Neck: Supple, no masses.  No bruit or JVD.  Pulmonary:  Good air movement, no audible wheezing, no use of accessory muscles.  Cardiac: Irregularly irregular, normal S1, S2, no Murmurs. Vascular:  carotid bruit noted Vessel Right Left  Radial Palpable Palpable  Carotid  Palpable  Palpable  Gastrointestinal: soft, non-distended. No guarding/no peritoneal signs.  Musculoskeletal: M/S 5/5 throughout.  No visible deformity.  Neurologic: CN 2-12 intact. Pain and light touch intact in extremities.  Symmetrical.  Speech is fluent. Motor exam as listed above. Psychiatric: Judgment intact, Mood & affect appropriate for pt's clinical situation. Dermatologic: No rashes or ulcers noted.  No changes consistent with cellulitis.   CBC Lab Results  Component Value Date   WBC 6.4 02/20/2023   HGB 8.0 (L) 02/20/2023   HCT 25.5 (L) 02/20/2023   MCV 86.1 02/20/2023   PLT 241 02/20/2023    BMET    Component Value Date/Time   NA 137 02/20/2023 0620   NA 133 (L) 10/25/2014 1048   K 3.9 02/20/2023 0620   K 3.3 (L) 10/25/2014 1048   CL 109 02/20/2023 0620   CL 92 (L) 10/25/2014 1048   CO2 21 (L) 02/20/2023 0620   CO2 30 10/25/2014 1048   GLUCOSE 97 02/20/2023 0620   GLUCOSE 125 (H) 10/25/2014 1048   BUN 24 (H) 02/20/2023 0620   BUN 18 10/25/2014 1048   CREATININE 1.11 02/20/2023 0620   CREATININE 1.02 10/25/2014 1048   CALCIUM 9.2 02/20/2023 0620   CALCIUM 8.9 10/25/2014 1048   GFRNONAA >60 02/20/2023 0620   GFRNONAA >60 10/25/2014 1048   GFRNONAA >60 06/09/2014 0431   GFRAA >60 02/24/2020 0401   GFRAA >60 10/25/2014 1048   GFRAA >60 06/09/2014 0431   CrCl cannot be  calculated (Patient's most recent lab result is older than the maximum 21 days allowed.).  COAG Lab Results  Component Value Date   INR 1.1 06/27/2022   INR 1.1 05/27/2022   INR 1.0 04/23/2022    Radiology CT ANGIO HEAD NECK W WO CM  Result Date: 02/18/2023 CLINICAL DATA:  Acute infarcts seen on brain MRI. EXAM: CT ANGIOGRAPHY HEAD AND NECK TECHNIQUE: Multidetector CT imaging of the head and neck was performed using the standard protocol during bolus administration of intravenous contrast. Multiplanar CT image reconstructions and MIPs were obtained to evaluate the vascular anatomy. Carotid stenosis measurements (when applicable) are obtained utilizing NASCET criteria, using the distal internal carotid diameter as the denominator. RADIATION DOSE REDUCTION: This exam was performed according to the departmental dose-optimization program which  includes automated exposure control, adjustment of the mA and/or kV according to patient size and/or use of iterative reconstruction technique. CONTRAST:  75mL OMNIPAQUE IOHEXOL 350 MG/ML SOLN COMPARISON:  Brain MRI 2 days prior, CTA head neck 06/27/2022 FINDINGS: CT HEAD FINDINGS Brain: The small acute infarcts seen on the brain MRI from 2 days prior not well seen on the current study. There is no evidence of acute intracranial hemorrhage, extra-axial fluid collection, or new acute territorial infarct. Parenchymal volume is stable. The ventricles are stable in size. The remote cortical infarcts in the left frontal, right parietal, and bilateral occipital lobes are unchanged. There is no mass lesion.  There is no mass effect or midline shift. Vascular: See below. Skull: Normal. Negative for fracture or focal lesion. Sinuses/Orbits: The paranasal sinuses are clear. The globes and orbits Other: Are unremarkable.  None. Review of the MIP images confirms the above findings CTA NECK FINDINGS Aortic arch: There is mild calcified plaque in the aortic arch. The origins of the  major branch vessels are patent. The subclavian arteries are patent to the level imaged with extensive calcified plaque. Right carotid system: The right common carotid artery is patent with scattered plaque but no hemodynamically significant stenosis. There is calcified plaque at the bifurcation without hemodynamically significant stenosis or occlusion of the internal carotid artery. The external carotid artery is patent. There is no evidence of dissection or aneurysm. Left carotid system: The plaque in the distal common carotid artery resulting in high-grade stenosis. Plaque extends into the proximal internal carotid artery resulting in approximately 50% stenosis. The distal internal carotid artery is patent. The external carotid artery is patent but stenotic at its origin. There is no evidence of dissection or aneurysm. These findings are unchanged. Vertebral arteries: There is moderate stenosis of the origin of the right vertebral artery. The vertebral arteries are otherwise patent, without other significant stenosis or occlusion in the neck. There is no evidence of dissection or aneurysm. Skeleton: There is advanced multilevel degenerative change of the cervical spine. There is no acute osseous abnormality or suspicious osseous lesion. There is no visible canal hematoma. Other neck: The soft tissues of the neck are unremarkable. Upper chest: The imaged lung apices are clear. Review of the MIP images confirms the above findings CTA HEAD FINDINGS Anterior circulation: There is calcified plaque in the carotid siphons resulting in mild-to-moderate stenosis bilaterally The bilateral MCAs are patent, with no proximal high-grade stenosis or occlusion. The bilateral ACAs are patent, without proximal high-grade stenosis or occlusion. There is no aneurysm or AVM. Posterior circulation: There is calcified plaque in the distal V3 segments, V4 junction, and left V4 segment resulting in up to mild-to-moderate stenosis of the  left V4 segment. The right vertebral artery is diminutive after the PICA origin likely reflecting atherosclerotic disease. PICA is identified bilaterally. The basilar artery is patent. The bilateral PCAs are patent, without proximal high-grade stenosis or occlusion. A left posterior communicating artery is identified. There is no aneurysm or AVM. Venous sinuses: Suboptimally evaluated due to bolus timing. Anatomic variants: None. Review of the MIP images confirms the above findings IMPRESSION: 1. The small acute infarcts seen on the recent brain MRI are not well seen on the current study. No new acute intracranial pathology. 2. Stable CTA head/neck since 06/27/2022 with no new emergent finding. 3. Unchanged bulky plaque in the distal left common carotid artery resulting in high-grade stenosis. Calcified plaque at the right carotid bifurcation without hemodynamically significant stenosis, and moderate stenosis of  the origin of the right vertebral artery. 4. Unchanged calcified plaque in the carotid siphons without high-grade stenosis, and diminutive right V4 segment. Electronically Signed   By: Lesia Hausen M.D.   On: 02/18/2023 13:07   ECHOCARDIOGRAM COMPLETE BUBBLE STUDY  Result Date: 02/18/2023    ECHOCARDIOGRAM REPORT   Patient Name:   NIKODEM SCHWANKE Date of Exam: 02/17/2023 Medical Rec #:  [id number]        Height:       66.0 in Accession #:    [id number]       Weight:       156.5 lb Date of Birth:  [birth year]         BSA:          1.802 m Patient Age:    81 years         BP:           142/66 mmHg Patient Gender: M                HR:           60 bpm. Exam Location:  [facility] Procedure: 2D Echo, Cardiac Doppler, Color Doppler and Saline Contrast Bubble            Study Indications:     Stroke 434.91 /I63.9  History:         Patient has prior history of Echocardiogram examinations, most                  recent 04/22/2022. Risk Factors:Hypertension. Dementia.  Sonographer:     Cristela Blue Referring Phys:  [id number]  N [facility] Diagnosing Phys: Alwyn Pea MD  Sonographer Comments: Image acquisition challenging due to respiratory motion. IMPRESSIONS  1. Left ventricular ejection fraction, by estimation, is 70 to 75%. The left ventricle has hyperdynamic function. The left ventricle has no regional wall motion abnormalities. Left ventricular diastolic parameters were normal.  2. Right ventricular systolic function is normal. The right ventricular size is normal.  3. The mitral valve is normal in structure. No evidence of mitral valve regurgitation.  4. The aortic valve is normal in structure. Aortic valve regurgitation is not visualized.  5. Agitated saline contrast bubble study was negative, with no evidence of any interatrial shunt. FINDINGS  Left Ventricle: Left ventricular ejection fraction, by estimation, is 70 to 75%. The left ventricle has hyperdynamic function. The left ventricle has no regional wall motion abnormalities. The left ventricular internal cavity size was normal in size. There is no left ventricular hypertrophy. Left ventricular diastolic parameters were normal. Right Ventricle: The right ventricular size is normal. No increase in right ventricular wall thickness. Right ventricular systolic function is normal. Left Atrium: Left atrial size was normal in size. Right Atrium: Right atrial size was normal in size. Pericardium: There is no evidence of pericardial effusion. Mitral Valve: The mitral valve is normal in structure. No evidence of mitral valve regurgitation. Tricuspid Valve: The tricuspid valve is normal in structure. Tricuspid valve regurgitation is not demonstrated. Aortic Valve: The aortic valve is normal in structure. Aortic valve regurgitation is not visualized. Aortic valve mean gradient measures 7.0 mmHg. Aortic valve peak gradient measures 11.7 mmHg. Aortic valve area, by VTI measures 2.04 cm. Pulmonic Valve: The pulmonic valve was normal in structure. Pulmonic valve regurgitation is not  visualized. Aorta: The ascending aorta was not well visualized. IAS/Shunts: No atrial level shunt detected by color flow Doppler. Agitated saline contrast was given intravenously to evaluate for  intracardiac shunting. Agitated saline contrast bubble study was negative, with no evidence of any interatrial shunt.  LEFT VENTRICLE PLAX 2D LVIDd:         4.30 cm   Diastology LVIDs:         2.50 cm   LV e' medial:    8.27 cm/s LV PW:         1.10 cm   LV E/e' medial:  11.9 LV IVS:        0.90 cm   LV e' lateral:   10.40 cm/s LVOT diam:     2.00 cm   LV E/e' lateral: 9.4 LV SV:         77 LV SV Index:   43 LVOT Area:     3.14 cm  RIGHT VENTRICLE RV Basal diam:  3.70 cm RV Mid diam:    3.30 cm RV S prime:     17.50 cm/s TAPSE (M-mode): 2.2 cm LEFT ATRIUM           Index        RIGHT ATRIUM           Index LA diam:      4.40 cm 2.44 cm/m   RA Area:     13.80 cm LA Vol (A2C): 47.8 ml 26.50 ml/m  RA Volume:   32.20 ml  17.87 ml/m LA Vol (A4C): 55.8 ml 30.94 ml/m  AORTIC VALVE AV Area (Vmax):    1.83 cm AV Area (Vmean):   1.74 cm AV Area (VTI):     2.04 cm AV Vmax:           171.00 cm/s AV Vmean:          126.000 cm/s AV VTI:            0.379 m AV Peak Grad:      11.7 mmHg AV Mean Grad:      7.0 mmHg LVOT Vmax:         99.80 cm/s LVOT Vmean:        69.600 cm/s LVOT VTI:          0.246 m LVOT/AV VTI ratio: 0.65  AORTA Ao Root diam: 3.20 cm MITRAL VALVE               TRICUSPID VALVE MV Area (PHT): 2.95 cm    TR Peak grad:   49.3 mmHg MV Decel Time: 257 msec    TR Vmax:        351.00 cm/s MV E velocity: 98.00 cm/s MV A velocity: 79.70 cm/s  SHUNTS MV E/A ratio:  1.23        Systemic VTI:  0.25 m                            Systemic Diam: 2.00 cm Alwyn Pea MD Electronically signed by Alwyn Pea MD Signature Date/Time: 02/18/2023/10:36:36 AM    Final    EEG adult  Result Date: 02/17/2023 Jefferson Fuel, MD     02/17/2023  8:05 PM Routine EEG Report JAHLANI MURRIE is a 82 y.o. male with a history of  altered mental status who is undergoing an EEG to evaluate for seizures. Report: This EEG was acquired with electrodes placed according to the International 10-20 electrode system (including Fp1, Fp2, F3, F4, C3, C4, P3, P4, O1, O2, T3, T4, T5, T6, A1, A2, Fz, Cz, Pz). The following electrodes were missing or displaced:  none. The occipital dominant rhythm was 6-7 Hz. This activity is reactive to stimulation. Drowsiness was manifested by background fragmentation; deeper stages of sleep were identified by K complexes and sleep spindles. There was no focal slowing. There were no interictal epileptiform discharges. There were no electrographic seizures identified. Photic stimulation and hyperventilation were not performed. Impression and clinical correlation: This EEG was obtained while awake and asleep and is abnormal due to mild-to-moderate diffuse slowing indicative of global cerebral dysfunction. Epileptiform abnormalities were not seen during this recording. Bing Neighbors, MD [facility] [phone] If 7pm- 7am, please page neurology on call as listed in AMION.   MR BRAIN WO CONTRAST  Result Date: 02/16/2023 CLINICAL DATA:  Altered mental status, severe dysarthria, stroke suspected EXAM: MRI HEAD WITHOUT CONTRAST TECHNIQUE: Multiplanar, multiecho pulse sequences of the brain and surrounding structures were obtained without intravenous contrast. COMPARISON:  06/27/2022 FINDINGS: Brain: Restricted diffusion with ADC correlate in the cortex of the anterior left frontal lobe (series 5, images 31, 33-34), posterior left frontal lobe (series 5, images 36-38), and left parietal lobe (series 5, images 41-42). No acute hemorrhage, mass, mass effect, or midline shift. No hydrocephalus or extra-axial collection. Foci of hemosiderin deposition bilateral temporal, bilateral occipital occipital, and left frontal lobe, as well as the left thalamus, left basal ganglia, and right midbrain. Encephalomalacia in the  left posterior frontal lobe, right parietal lobe, and right occipital lobe, likely sequela of remote infarct. Advanced cerebral atrophy for age. Remote lacunar infarcts in the left greater than right basal ganglia and corona radiata, as well as the pons. Confluent T2 hyperintense signal in the periventricular white matter, likely the sequela of chronic small vessel ischemic disease. Vascular: Normal arterial flow voids. Skull and upper cervical spine: Normal marrow signal. Sinuses/Orbits: Mucosal thickening in the ethmoid air cells. No acute finding in the orbits. Other: The mastoids are well aerated. IMPRESSION: Small acute infarcts in the cortex of the anterior left frontal lobe, posterior left frontal lobe, and left parietal lobe. These results will be called to the ordering clinician or representative by the Radiologist Assistant, and communication documented in the PACS or [employer]. Electronically Signed   By: Wiliam Ke M.D.   On: 02/16/2023 19:12   DG Chest Port 1 View  Result Date: 02/16/2023 CLINICAL DATA:  Altered mental status.  Hematuria, possible UTI. EXAM: PORTABLE CHEST 1 VIEW COMPARISON:  12/07/2022 FINDINGS: Atherosclerotic calcification of the aortic arch and descending thoracic aorta. Mild enlargement of the cardiopericardial silhouette, without edema. Low lung volumes are present, causing crowding of the pulmonary vasculature. Old healed bilateral rib fractures. Suspected small hiatal hernia. No blunting of the costophrenic angles. The lungs appear otherwise clear. IMPRESSION: 1. Mild enlargement of the cardiopericardial silhouette, without edema. 2. Low lung volumes are present, causing crowding of the pulmonary vasculature. 3. Old healed bilateral rib fractures. 4. Suspected small hiatal hernia. Electronically Signed   By: Gaylyn Rong M.D.   On: 02/16/2023 15:59   CT Head Wo Contrast  Result Date: 02/16/2023 CLINICAL DATA:  Altered mental status. EXAM: CT HEAD WITHOUT  CONTRAST TECHNIQUE: Contiguous axial images were obtained from the base of the skull through the vertex without intravenous contrast. RADIATION DOSE REDUCTION: This exam was performed according to the departmental dose-optimization program which includes automated exposure control, adjustment of the mA and/or kV according to patient size and/or use of iterative reconstruction technique. COMPARISON:  06/27/2022 FINDINGS: Brain: No evidence of intracranial hemorrhage, acute infarction, hydrocephalus, extra-axial collection, or mass lesion/mass effect.  Moderate cerebral atrophy and chronic small vessel disease are unchanged in appearance. Old left frontoparietal lobe infarct unchanged in appearance. Old left basal ganglia lacunar infarcts again noted. Vascular:  No hyperdense vessel or other acute findings. Skull: No evidence of fracture or other significant bone abnormality. Sinuses/Orbits:  No acute findings. Other: None. IMPRESSION: No acute intracranial abnormality. Old left frontoparietal lobe infarct and old left basal ganglia lacunar infarcts. Moderate cerebral atrophy and chronic small vessel disease. Electronically Signed   By: Danae Orleans M.D.   On: 02/16/2023 15:33     Assessment/Plan 1. Stenosis of left carotid artery Recommend:  The patient is symptomatic with respect to the left carotid stenosis.  The patient has now progressed and has a lesion the is >75%.  Patient's CT angiography of the carotid arteries confirms >75% left ICA stenosis.  The anatomical considerations support surgery over stenting given the near circumferential dense calcification.  This was discussed in detail with the patient.  The patient does indeed need surgery, therefore, cardiac clearance will be arranged. Once cleared the patient will be scheduled for surgery.  I have completed Shared Decision-Making with Loleta Chance Aldridgeprior to carotid surgery/intervention. The conversation included: -Discussion of all treatment  options including carotid endarterectomy (CEA), carotid artery stenting (which includes transcarotid artery revascularization (TCAR)), and optimal medical therapy (OMT). -Explanation of risks and benefits for each option specific to Peyton Najjar L Fritsche's clinical situation. -Integration of clinical guidelines as it relates to the patient's history and comorbidities. -Discussion and incorporation of [name] and their personal preferences and priorities in choosing a treatment plan plan  If the patient was unable to participate in Shared Decision Making this process was done with patient and family including brother.  The patient's NIHSS score is as follows: 6 Mild: 1 - 5 Mild to Moderately Severe: 5 - 14 Severe: 15 - 24 Very Severe: >25  The risks, benefits and alternative therapies were reviewed in detail with the patient.  All questions were answered.  The patient agrees to proceed with surgery of the left carotid artery.  Continue antiplatelet therapy as prescribed. Continue management of CAD, HTN and Hyperlipidemia. Healthy heart diet, encouraged exercise at least 4 times per week.   2. Acute CVA (cerebrovascular accident) See #1  We will get cardiac clearance for surgery. - Ambulatory referral to Cardiology  3. Primary hypertension Continue antihypertensive medications as already ordered, these medications have been reviewed and there are no changes at this time. - Ambulatory referral to Cardiology  4. AF (paroxysmal atrial fibrillation) Continue antiarrhythmia medications as already ordered, these medications have been reviewed and there are no changes at this time.  Continue anticoagulation as ordered by Cardiology Service - Ambulatory referral to Cardiology  5. Mixed hyperlipidemia Continue statin as ordered and reviewed, no changes at this time     [name], MD  03/16/2023 10:53 AM

## 2023-03-17 ENCOUNTER — Encounter (INDEPENDENT_AMBULATORY_CARE_PROVIDER_SITE_OTHER): Payer: Self-pay | Admitting: Vascular Surgery

## 2023-03-17 ENCOUNTER — Ambulatory Visit (INDEPENDENT_AMBULATORY_CARE_PROVIDER_SITE_OTHER): Payer: Medicare HMO | Admitting: Vascular Surgery

## 2023-03-17 VITALS — BP 110/68 | HR 60 | Resp 18 | Ht 67.0 in | Wt 137.0 lb

## 2023-03-17 DIAGNOSIS — N390 Urinary tract infection, site not specified: Secondary | ICD-10-CM | POA: Diagnosis not present

## 2023-03-17 DIAGNOSIS — I480 Paroxysmal atrial fibrillation: Secondary | ICD-10-CM | POA: Diagnosis not present

## 2023-03-17 DIAGNOSIS — Z8673 Personal history of transient ischemic attack (TIA), and cerebral infarction without residual deficits: Secondary | ICD-10-CM | POA: Diagnosis not present

## 2023-03-17 DIAGNOSIS — F0150 Vascular dementia without behavioral disturbance: Secondary | ICD-10-CM | POA: Diagnosis not present

## 2023-03-17 DIAGNOSIS — I6522 Occlusion and stenosis of left carotid artery: Secondary | ICD-10-CM

## 2023-03-17 DIAGNOSIS — G309 Alzheimer's disease, unspecified: Secondary | ICD-10-CM | POA: Diagnosis not present

## 2023-03-17 DIAGNOSIS — F0280 Dementia in other diseases classified elsewhere without behavioral disturbance: Secondary | ICD-10-CM | POA: Diagnosis not present

## 2023-03-17 DIAGNOSIS — I10 Essential (primary) hypertension: Secondary | ICD-10-CM | POA: Diagnosis not present

## 2023-03-17 DIAGNOSIS — E782 Mixed hyperlipidemia: Secondary | ICD-10-CM | POA: Diagnosis not present

## 2023-03-17 DIAGNOSIS — A499 Bacterial infection, unspecified: Secondary | ICD-10-CM | POA: Diagnosis not present

## 2023-03-17 DIAGNOSIS — I639 Cerebral infarction, unspecified: Secondary | ICD-10-CM

## 2023-03-18 DIAGNOSIS — I2510 Atherosclerotic heart disease of native coronary artery without angina pectoris: Secondary | ICD-10-CM | POA: Diagnosis not present

## 2023-03-18 DIAGNOSIS — I6522 Occlusion and stenosis of left carotid artery: Secondary | ICD-10-CM | POA: Diagnosis not present

## 2023-03-18 DIAGNOSIS — I480 Paroxysmal atrial fibrillation: Secondary | ICD-10-CM | POA: Diagnosis not present

## 2023-03-18 DIAGNOSIS — G40909 Epilepsy, unspecified, not intractable, without status epilepticus: Secondary | ICD-10-CM | POA: Diagnosis not present

## 2023-03-18 DIAGNOSIS — I69351 Hemiplegia and hemiparesis following cerebral infarction affecting right dominant side: Secondary | ICD-10-CM | POA: Diagnosis not present

## 2023-03-18 DIAGNOSIS — I10 Essential (primary) hypertension: Secondary | ICD-10-CM | POA: Diagnosis not present

## 2023-03-18 DIAGNOSIS — F0280 Dementia in other diseases classified elsewhere without behavioral disturbance: Secondary | ICD-10-CM | POA: Diagnosis not present

## 2023-03-18 DIAGNOSIS — G309 Alzheimer's disease, unspecified: Secondary | ICD-10-CM | POA: Diagnosis not present

## 2023-03-18 DIAGNOSIS — N390 Urinary tract infection, site not specified: Secondary | ICD-10-CM | POA: Diagnosis not present

## 2023-03-19 ENCOUNTER — Encounter (INDEPENDENT_AMBULATORY_CARE_PROVIDER_SITE_OTHER): Payer: Self-pay | Admitting: Vascular Surgery

## 2023-03-19 DIAGNOSIS — N390 Urinary tract infection, site not specified: Secondary | ICD-10-CM | POA: Diagnosis not present

## 2023-03-19 DIAGNOSIS — I69351 Hemiplegia and hemiparesis following cerebral infarction affecting right dominant side: Secondary | ICD-10-CM | POA: Diagnosis not present

## 2023-03-19 DIAGNOSIS — I2510 Atherosclerotic heart disease of native coronary artery without angina pectoris: Secondary | ICD-10-CM | POA: Diagnosis not present

## 2023-03-19 DIAGNOSIS — I6522 Occlusion and stenosis of left carotid artery: Secondary | ICD-10-CM | POA: Diagnosis not present

## 2023-03-19 DIAGNOSIS — G309 Alzheimer's disease, unspecified: Secondary | ICD-10-CM | POA: Diagnosis not present

## 2023-03-19 DIAGNOSIS — G40909 Epilepsy, unspecified, not intractable, without status epilepticus: Secondary | ICD-10-CM | POA: Diagnosis not present

## 2023-03-19 DIAGNOSIS — I480 Paroxysmal atrial fibrillation: Secondary | ICD-10-CM | POA: Diagnosis not present

## 2023-03-19 DIAGNOSIS — F0280 Dementia in other diseases classified elsewhere without behavioral disturbance: Secondary | ICD-10-CM | POA: Diagnosis not present

## 2023-03-19 DIAGNOSIS — I10 Essential (primary) hypertension: Secondary | ICD-10-CM | POA: Diagnosis not present

## 2023-03-20 DIAGNOSIS — I10 Essential (primary) hypertension: Secondary | ICD-10-CM | POA: Diagnosis not present

## 2023-03-20 DIAGNOSIS — G309 Alzheimer's disease, unspecified: Secondary | ICD-10-CM | POA: Diagnosis not present

## 2023-03-20 DIAGNOSIS — N390 Urinary tract infection, site not specified: Secondary | ICD-10-CM | POA: Diagnosis not present

## 2023-03-20 DIAGNOSIS — G40909 Epilepsy, unspecified, not intractable, without status epilepticus: Secondary | ICD-10-CM | POA: Diagnosis not present

## 2023-03-20 DIAGNOSIS — I69351 Hemiplegia and hemiparesis following cerebral infarction affecting right dominant side: Secondary | ICD-10-CM | POA: Diagnosis not present

## 2023-03-20 DIAGNOSIS — I6522 Occlusion and stenosis of left carotid artery: Secondary | ICD-10-CM | POA: Diagnosis not present

## 2023-03-20 DIAGNOSIS — F0280 Dementia in other diseases classified elsewhere without behavioral disturbance: Secondary | ICD-10-CM | POA: Diagnosis not present

## 2023-03-20 DIAGNOSIS — I2510 Atherosclerotic heart disease of native coronary artery without angina pectoris: Secondary | ICD-10-CM | POA: Diagnosis not present

## 2023-03-20 DIAGNOSIS — I480 Paroxysmal atrial fibrillation: Secondary | ICD-10-CM | POA: Diagnosis not present

## 2023-03-21 DIAGNOSIS — E7800 Pure hypercholesterolemia, unspecified: Secondary | ICD-10-CM | POA: Diagnosis not present

## 2023-03-21 DIAGNOSIS — I480 Paroxysmal atrial fibrillation: Secondary | ICD-10-CM | POA: Diagnosis not present

## 2023-03-21 DIAGNOSIS — I10 Essential (primary) hypertension: Secondary | ICD-10-CM | POA: Diagnosis not present

## 2023-03-21 DIAGNOSIS — G309 Alzheimer's disease, unspecified: Secondary | ICD-10-CM | POA: Diagnosis not present

## 2023-03-25 ENCOUNTER — Telehealth (INDEPENDENT_AMBULATORY_CARE_PROVIDER_SITE_OTHER): Payer: Self-pay | Admitting: Nurse Practitioner

## 2023-03-25 DIAGNOSIS — G309 Alzheimer's disease, unspecified: Secondary | ICD-10-CM | POA: Diagnosis not present

## 2023-03-25 DIAGNOSIS — I6522 Occlusion and stenosis of left carotid artery: Secondary | ICD-10-CM | POA: Diagnosis not present

## 2023-03-25 DIAGNOSIS — I10 Essential (primary) hypertension: Secondary | ICD-10-CM | POA: Diagnosis not present

## 2023-03-25 DIAGNOSIS — G40909 Epilepsy, unspecified, not intractable, without status epilepticus: Secondary | ICD-10-CM | POA: Diagnosis not present

## 2023-03-25 DIAGNOSIS — I2510 Atherosclerotic heart disease of native coronary artery without angina pectoris: Secondary | ICD-10-CM | POA: Diagnosis not present

## 2023-03-25 DIAGNOSIS — I480 Paroxysmal atrial fibrillation: Secondary | ICD-10-CM | POA: Diagnosis not present

## 2023-03-25 DIAGNOSIS — I69351 Hemiplegia and hemiparesis following cerebral infarction affecting right dominant side: Secondary | ICD-10-CM | POA: Diagnosis not present

## 2023-03-25 DIAGNOSIS — N390 Urinary tract infection, site not specified: Secondary | ICD-10-CM | POA: Diagnosis not present

## 2023-03-25 DIAGNOSIS — F0280 Dementia in other diseases classified elsewhere without behavioral disturbance: Secondary | ICD-10-CM | POA: Diagnosis not present

## 2023-03-25 NOTE — Telephone Encounter (Signed)
Dondra Spry from [facility] call regarding a clearance form for patient.  Please fax to [phone] for surgery.

## 2023-03-25 NOTE — Telephone Encounter (Signed)
Contacted cardiology and a message was sent to [facility] to return call regarding message

## 2023-03-25 NOTE — Telephone Encounter (Signed)
Cardiac clearance was faxed on yesterday

## 2023-03-26 DIAGNOSIS — I10 Essential (primary) hypertension: Secondary | ICD-10-CM | POA: Diagnosis not present

## 2023-03-26 DIAGNOSIS — G40909 Epilepsy, unspecified, not intractable, without status epilepticus: Secondary | ICD-10-CM | POA: Diagnosis not present

## 2023-03-26 DIAGNOSIS — I480 Paroxysmal atrial fibrillation: Secondary | ICD-10-CM | POA: Diagnosis not present

## 2023-03-26 DIAGNOSIS — I69351 Hemiplegia and hemiparesis following cerebral infarction affecting right dominant side: Secondary | ICD-10-CM | POA: Diagnosis not present

## 2023-03-26 DIAGNOSIS — F0280 Dementia in other diseases classified elsewhere without behavioral disturbance: Secondary | ICD-10-CM | POA: Diagnosis not present

## 2023-03-26 DIAGNOSIS — I2510 Atherosclerotic heart disease of native coronary artery without angina pectoris: Secondary | ICD-10-CM | POA: Diagnosis not present

## 2023-03-26 DIAGNOSIS — G309 Alzheimer's disease, unspecified: Secondary | ICD-10-CM | POA: Diagnosis not present

## 2023-03-26 DIAGNOSIS — I6522 Occlusion and stenosis of left carotid artery: Secondary | ICD-10-CM | POA: Diagnosis not present

## 2023-03-26 DIAGNOSIS — N390 Urinary tract infection, site not specified: Secondary | ICD-10-CM | POA: Diagnosis not present

## 2023-03-27 ENCOUNTER — Inpatient Hospital Stay: Payer: Medicare HMO

## 2023-03-27 ENCOUNTER — Other Ambulatory Visit: Payer: Self-pay

## 2023-03-27 ENCOUNTER — Inpatient Hospital Stay
Admission: EM | Admit: 2023-03-27 | Discharge: 2023-04-07 | DRG: 038 | Disposition: A | Discharge status: Skilled Nursing Facility | Payer: Medicare HMO | Attending: Student | Admitting: Student

## 2023-03-27 ENCOUNTER — Emergency Department: Payer: Medicare HMO

## 2023-03-27 DIAGNOSIS — G9389 Other specified disorders of brain: Secondary | ICD-10-CM | POA: Diagnosis present

## 2023-03-27 DIAGNOSIS — E785 Hyperlipidemia, unspecified: Secondary | ICD-10-CM | POA: Diagnosis not present

## 2023-03-27 DIAGNOSIS — Z7401 Bed confinement status: Secondary | ICD-10-CM | POA: Diagnosis not present

## 2023-03-27 DIAGNOSIS — E860 Dehydration: Secondary | ICD-10-CM | POA: Diagnosis not present

## 2023-03-27 DIAGNOSIS — N390 Urinary tract infection, site not specified: Secondary | ICD-10-CM | POA: Diagnosis not present

## 2023-03-27 DIAGNOSIS — F0390 Unspecified dementia without behavioral disturbance: Secondary | ICD-10-CM | POA: Diagnosis present

## 2023-03-27 DIAGNOSIS — D539 Nutritional anemia, unspecified: Secondary | ICD-10-CM | POA: Diagnosis present

## 2023-03-27 DIAGNOSIS — I6359 Cerebral infarction due to unspecified occlusion or stenosis of other cerebral artery: Secondary | ICD-10-CM | POA: Diagnosis not present

## 2023-03-27 DIAGNOSIS — I6523 Occlusion and stenosis of bilateral carotid arteries: Secondary | ICD-10-CM | POA: Diagnosis not present

## 2023-03-27 DIAGNOSIS — Z8701 Personal history of pneumonia (recurrent): Secondary | ICD-10-CM

## 2023-03-27 DIAGNOSIS — E876 Hypokalemia: Secondary | ICD-10-CM | POA: Diagnosis not present

## 2023-03-27 DIAGNOSIS — E440 Moderate protein-calorie malnutrition: Secondary | ICD-10-CM | POA: Insufficient documentation

## 2023-03-27 DIAGNOSIS — R479 Unspecified speech disturbances: Principal | ICD-10-CM

## 2023-03-27 DIAGNOSIS — E872 Acidosis, unspecified: Secondary | ICD-10-CM | POA: Diagnosis not present

## 2023-03-27 DIAGNOSIS — Z87891 Personal history of nicotine dependence: Secondary | ICD-10-CM

## 2023-03-27 DIAGNOSIS — E875 Hyperkalemia: Secondary | ICD-10-CM | POA: Diagnosis not present

## 2023-03-27 DIAGNOSIS — Z7189 Other specified counseling: Secondary | ICD-10-CM | POA: Diagnosis not present

## 2023-03-27 DIAGNOSIS — G309 Alzheimer's disease, unspecified: Secondary | ICD-10-CM | POA: Diagnosis not present

## 2023-03-27 DIAGNOSIS — R569 Unspecified convulsions: Secondary | ICD-10-CM | POA: Diagnosis not present

## 2023-03-27 DIAGNOSIS — I6522 Occlusion and stenosis of left carotid artery: Secondary | ICD-10-CM | POA: Diagnosis not present

## 2023-03-27 DIAGNOSIS — D509 Iron deficiency anemia, unspecified: Secondary | ICD-10-CM | POA: Diagnosis present

## 2023-03-27 DIAGNOSIS — I63232 Cerebral infarction due to unspecified occlusion or stenosis of left carotid arteries: Secondary | ICD-10-CM | POA: Diagnosis not present

## 2023-03-27 DIAGNOSIS — I10 Essential (primary) hypertension: Secondary | ICD-10-CM | POA: Diagnosis present

## 2023-03-27 DIAGNOSIS — N179 Acute kidney failure, unspecified: Secondary | ICD-10-CM | POA: Diagnosis not present

## 2023-03-27 DIAGNOSIS — R4701 Aphasia: Secondary | ICD-10-CM | POA: Diagnosis present

## 2023-03-27 DIAGNOSIS — Z66 Do not resuscitate: Secondary | ICD-10-CM | POA: Diagnosis not present

## 2023-03-27 DIAGNOSIS — J690 Pneumonitis due to inhalation of food and vomit: Secondary | ICD-10-CM | POA: Diagnosis not present

## 2023-03-27 DIAGNOSIS — R531 Weakness: Secondary | ICD-10-CM | POA: Diagnosis not present

## 2023-03-27 DIAGNOSIS — F1011 Alcohol abuse, in remission: Secondary | ICD-10-CM | POA: Diagnosis present

## 2023-03-27 DIAGNOSIS — G40909 Epilepsy, unspecified, not intractable, without status epilepticus: Secondary | ICD-10-CM | POA: Diagnosis present

## 2023-03-27 DIAGNOSIS — F0280 Dementia in other diseases classified elsewhere without behavioral disturbance: Secondary | ICD-10-CM | POA: Diagnosis not present

## 2023-03-27 DIAGNOSIS — Z9889 Other specified postprocedural states: Secondary | ICD-10-CM | POA: Diagnosis not present

## 2023-03-27 DIAGNOSIS — I2510 Atherosclerotic heart disease of native coronary artery without angina pectoris: Secondary | ICD-10-CM | POA: Diagnosis not present

## 2023-03-27 DIAGNOSIS — I6389 Other cerebral infarction: Secondary | ICD-10-CM | POA: Diagnosis not present

## 2023-03-27 DIAGNOSIS — Z8673 Personal history of transient ischemic attack (TIA), and cerebral infarction without residual deficits: Secondary | ICD-10-CM | POA: Diagnosis not present

## 2023-03-27 DIAGNOSIS — Z515 Encounter for palliative care: Secondary | ICD-10-CM

## 2023-03-27 DIAGNOSIS — I610 Nontraumatic intracerebral hemorrhage in hemisphere, subcortical: Secondary | ICD-10-CM | POA: Diagnosis not present

## 2023-03-27 DIAGNOSIS — I69351 Hemiplegia and hemiparesis following cerebral infarction affecting right dominant side: Secondary | ICD-10-CM | POA: Diagnosis not present

## 2023-03-27 DIAGNOSIS — Z6825 Body mass index (BMI) 25.0-25.9, adult: Secondary | ICD-10-CM

## 2023-03-27 DIAGNOSIS — K219 Gastro-esophageal reflux disease without esophagitis: Secondary | ICD-10-CM | POA: Diagnosis present

## 2023-03-27 DIAGNOSIS — I959 Hypotension, unspecified: Secondary | ICD-10-CM | POA: Diagnosis not present

## 2023-03-27 DIAGNOSIS — I480 Paroxysmal atrial fibrillation: Secondary | ICD-10-CM | POA: Diagnosis present

## 2023-03-27 DIAGNOSIS — M20092 Other deformity of left finger(s): Secondary | ICD-10-CM | POA: Diagnosis present

## 2023-03-27 DIAGNOSIS — I69391 Dysphagia following cerebral infarction: Secondary | ICD-10-CM

## 2023-03-27 DIAGNOSIS — I639 Cerebral infarction, unspecified: Secondary | ICD-10-CM | POA: Diagnosis present

## 2023-03-27 DIAGNOSIS — Z823 Family history of stroke: Secondary | ICD-10-CM

## 2023-03-27 DIAGNOSIS — G459 Transient cerebral ischemic attack, unspecified: Secondary | ICD-10-CM | POA: Diagnosis not present

## 2023-03-27 DIAGNOSIS — J189 Pneumonia, unspecified organism: Secondary | ICD-10-CM | POA: Diagnosis not present

## 2023-03-27 DIAGNOSIS — R001 Bradycardia, unspecified: Secondary | ICD-10-CM | POA: Diagnosis not present

## 2023-03-27 DIAGNOSIS — R0602 Shortness of breath: Secondary | ICD-10-CM | POA: Diagnosis not present

## 2023-03-27 DIAGNOSIS — R9431 Abnormal electrocardiogram [ECG] [EKG]: Secondary | ICD-10-CM | POA: Diagnosis not present

## 2023-03-27 DIAGNOSIS — Z79899 Other long term (current) drug therapy: Secondary | ICD-10-CM

## 2023-03-27 DIAGNOSIS — J698 Pneumonitis due to inhalation of other solids and liquids: Secondary | ICD-10-CM | POA: Diagnosis not present

## 2023-03-27 DIAGNOSIS — R1310 Dysphagia, unspecified: Secondary | ICD-10-CM | POA: Diagnosis present

## 2023-03-27 DIAGNOSIS — R29810 Facial weakness: Secondary | ICD-10-CM | POA: Diagnosis present

## 2023-03-27 DIAGNOSIS — Z7901 Long term (current) use of anticoagulants: Secondary | ICD-10-CM | POA: Diagnosis not present

## 2023-03-27 LAB — DIFFERENTIAL
Abs Immature Granulocytes: 0.04 10*3/uL (ref 0.00–0.07)
Basophils Absolute: 0.1 10*3/uL (ref 0.0–0.1)
Basophils Relative: 1 %
Eosinophils Absolute: 0.4 10*3/uL (ref 0.0–0.5)
Eosinophils Relative: 4 %
Immature Granulocytes: 1 %
Lymphocytes Relative: 16 %
Lymphs Abs: 1.4 10*3/uL (ref 0.7–4.0)
Monocytes Absolute: 0.8 10*3/uL (ref 0.1–1.0)
Monocytes Relative: 9 %
Neutro Abs: 6.2 10*3/uL (ref 1.7–7.7)
Neutrophils Relative %: 69 %

## 2023-03-27 LAB — COMPREHENSIVE METABOLIC PANEL
ALT: 20 U/L (ref 0–44)
AST: 24 U/L (ref 15–41)
Albumin: 3.7 g/dL (ref 3.5–5.0)
Alkaline Phosphatase: 58 U/L (ref 38–126)
Anion gap: 8 (ref 5–15)
BUN: 18 mg/dL (ref 8–23)
CO2: 18 mmol/L — ABNORMAL LOW (ref 22–32)
Calcium: 9.3 mg/dL (ref 8.9–10.3)
Chloride: 112 mmol/L — ABNORMAL HIGH (ref 98–111)
Creatinine, Ser: 1.2 mg/dL (ref 0.61–1.24)
GFR, Estimated: >60 mL/min (ref >60)
Glucose, Bld: 104 mg/dL — ABNORMAL HIGH (ref 70–99)
Potassium: 3.7 mmol/L (ref 3.5–5.1)
Sodium: 138 mmol/L (ref 135–145)
Total Bilirubin: 0.3 mg/dL (ref 0.3–1.2)
Total Protein: 6.9 g/dL (ref 6.5–8.1)

## 2023-03-27 LAB — CBG MONITORING, ED: Glucose-Capillary: 103 mg/dL — ABNORMAL HIGH (ref 70–99)

## 2023-03-27 LAB — CBC
HCT: 28 % — ABNORMAL LOW (ref 39.0–52.0)
Hemoglobin: 8.3 g/dL — ABNORMAL LOW (ref 13.0–17.0)
MCH: 25.1 pg — ABNORMAL LOW (ref 26.0–34.0)
MCHC: 29.6 g/dL — ABNORMAL LOW (ref 30.0–36.0)
MCV: 84.6 fL (ref 80.0–100.0)
Platelets: 297 10*3/uL (ref 150–400)
RBC: 3.31 MIL/uL — ABNORMAL LOW (ref 4.22–5.81)
RDW: 16.5 % — ABNORMAL HIGH (ref 11.5–15.5)
WBC: 8.9 10*3/uL (ref 4.0–10.5)
nRBC: 0 % (ref 0.0–0.2)

## 2023-03-27 LAB — PROTIME-INR
INR: 1.4 — ABNORMAL HIGH (ref 0.8–1.2)
Prothrombin Time: 17.1 seconds — ABNORMAL HIGH (ref 11.4–15.2)

## 2023-03-27 LAB — HEMOGLOBIN A1C
Hgb A1c MFr Bld: 5.8 % — ABNORMAL HIGH (ref 4.8–5.6)
Mean Plasma Glucose: 119.76 mg/dL

## 2023-03-27 LAB — ETHANOL: Alcohol, Ethyl (B): <10 mg/dL (ref <10)

## 2023-03-27 LAB — APTT
aPTT: 38 seconds — ABNORMAL HIGH (ref 24–36)
aPTT: 39 seconds — ABNORMAL HIGH (ref 24–36)

## 2023-03-27 MED ORDER — LORAZEPAM 2 MG/ML IJ SOLN: 1.0000 mg | INTRAMUSCULAR | Status: DC | PRN

## 2023-03-27 MED ORDER — ONDANSETRON HCL 4 MG/2ML IJ SOLN: 4.0000 mg | Freq: Three times a day (TID) | INTRAMUSCULAR | Status: DC | PRN

## 2023-03-27 MED ORDER — LEVETIRACETAM 500 MG PO TABS
500.0000 mg | ORAL_TABLET | Freq: Two times a day (BID) | ORAL | Status: DC
  Administered 2023-03-27 – 2023-03-28 (×2): 500 mg via ORAL
  Filled 2023-03-27 (×2): qty 1

## 2023-03-27 MED ORDER — PANTOPRAZOLE SODIUM 40 MG PO TBEC
40.0000 mg | DELAYED_RELEASE_TABLET | Freq: Every day | ORAL | Status: DC
  Administered 2023-03-28: 40 mg via ORAL
  Filled 2023-03-27: qty 1

## 2023-03-27 MED ORDER — SODIUM CHLORIDE 0.9% FLUSH
3.0000 mL | Freq: Once | INTRAVENOUS | Status: AC
  Administered 2023-03-27: 3 mL via INTRAVENOUS

## 2023-03-27 MED ORDER — METOPROLOL SUCCINATE ER 25 MG PO TB24: 12.5000 mg | ORAL_TABLET | Freq: Every day | ORAL | Status: DC

## 2023-03-27 MED ORDER — APIXABAN 5 MG PO TABS
5.0000 mg | ORAL_TABLET | Freq: Two times a day (BID) | ORAL | Status: DC
  Administered 2023-03-27 – 2023-03-28 (×2): 5 mg via ORAL
  Filled 2023-03-27 (×2): qty 1

## 2023-03-27 MED ORDER — ACETAMINOPHEN 160 MG/5ML PO SOLN: 650.0000 mg | ORAL | Status: DC | PRN

## 2023-03-27 MED ORDER — TRAZODONE HCL 50 MG PO TABS
100.0000 mg | ORAL_TABLET | Freq: Every day | ORAL | Status: DC
  Administered 2023-03-27: 100 mg via ORAL
  Filled 2023-03-27: qty 2

## 2023-03-27 MED ORDER — ATORVASTATIN CALCIUM 20 MG PO TABS
20.0000 mg | ORAL_TABLET | Freq: Every evening | ORAL | Status: DC
  Administered 2023-03-27: 20 mg via ORAL
  Filled 2023-03-27: qty 1

## 2023-03-27 MED ORDER — ACETAMINOPHEN 650 MG RE SUPP: 650.0000 mg | RECTAL | Status: DC | PRN

## 2023-03-27 MED ORDER — STROKE: EARLY STAGES OF RECOVERY BOOK: Freq: Once | Status: AC

## 2023-03-27 MED ORDER — HYDRALAZINE HCL 20 MG/ML IJ SOLN: 5.0000 mg | INTRAMUSCULAR | Status: DC | PRN

## 2023-03-27 MED ORDER — FOOD THICKENER (SIMPLYTHICK): 1.0000 | ORAL | Status: DC | PRN

## 2023-03-27 MED ORDER — SENNOSIDES-DOCUSATE SODIUM 8.6-50 MG PO TABS: 1.0000 | ORAL_TABLET | Freq: Every evening | ORAL | Status: DC | PRN

## 2023-03-27 MED ORDER — ACETAMINOPHEN 325 MG PO TABS: 650.0000 mg | ORAL_TABLET | ORAL | Status: DC | PRN

## 2023-03-27 NOTE — Progress Notes (Signed)
This Chap responded to Code in ED. Pt is at CT Scan and no family at this time. Mirna Mires is available as needed.   03/27/23 1500  Spiritual Encounters  Type of Visit Initial  Care provided to: Pt not available  Conversation partners present during encounter Nurse  Referral source Code page  Reason for visit Code  OnCall Visit No

## 2023-03-27 NOTE — ED Triage Notes (Signed)
Pt here via EMS with /co of slurred speech since 1230. Neighbor and POA states pt is normally able to be understood but this RN cannot understand pt. Strength equal when pt is asked to smile, pt not able to follow that command at this time.

## 2023-03-27 NOTE — Progress Notes (Signed)
Pt was able to obtain MRI of the brain. Pt kept wanting out of the scanner. Pt pulling head coil off and yelling to come out of the scanner. Unable to complete MRA head. Pt stated He "Wanted out of the scanner and would not do any more pictures" Pt was pulled out of the scanner and taken back to the ED. Direct message sent to ordering provider.

## 2023-03-27 NOTE — ED Notes (Addendum)
First Nurse Note: Patient to ED via [facility] from home for slurred speech. Patient was working with speech therapist when she noticed a worsening speech at 1230. Hx of 3 strokes- unsure of deficits from those strokes.

## 2023-03-27 NOTE — Progress Notes (Signed)
Code stroke cart activated at 1347. Pt in CT at time of alert.  Tele neuro paged at 1350. Dr. Otelia Limes arrived in CT room at 1353.  1402 Per Dr. Otelia Limes stated pt not candidate for TNK.   Ricci Barker, Multimedia programmer

## 2023-03-27 NOTE — H&P (Signed)
History and Physical    Richard Carter:[id number] DOB: May 27, 1941 DOA: 03/27/2023  Referring MD/NP/PA:   PCP: Marisue Ivan, MD   Patient coming from:  The patient is coming from home.   Chief Complaint: slurred speech  HPI: KAIEN PEZZULLO is a 82 y.o. 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  Patient was recently hospitalized from 3/24-3/29 due to stroke.  Patient was found to have high-grade left carotid artery stenosis.  Dr. Gilda Crease of [facility] is following up with the patient. Options had been discussed for possible treatment including CEA, carotid stenting and medical therapy alone. Per his POA at bedside, at about 1230, patient was noted to have slurred speech and right arm weakness. His right arm weakness has resolved.  He still has persistent slurred speech.  No facial droop.  At her normal baseline, patient is oriented to the place and person, but not to the time.  His mental status seems to be close to baseline.  When I saw patient in ED, he is orientated to the person and place, not to the time.  He moves all extremities with symmetric weakness in all extremities.  Has chronic left middle finger contracture.  Denies chest pain, cough, shortness of breath.  No nausea, vomiting, diarrhea or abdominal pain.   Data reviewed independently and ED Course: pt was found to have WBC 8.9, INR 1.4, PTT 38, GFR> 60, temperature 97.5, blood pressure 125/75, heart rate 58, 120, RR 21, oxygen saturation 89%--100% on room air.  CT of head is negative for acute intracranial issues.  Patient is admitted to telemetry bed as inpatient.  Dr. Otelia Limes of neurology and Dr. Wyn Quaker of [facility] are consulted.  CT-head: 1. No acute intracranial hemorrhage or evidence of evolving large vessel  territory infarct. ASPECT score is 9. 2. Unchanged encephalomalacia in the left posterior frontal lobe, involving the right facial motor and pre motor regions.  MRI-brain: Assessment is markedly limited due to the degree of motion artifact. Within this limitation-  1. Acute infarct in the postcentral gyrus on the left  2. 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   EKG: I have personally reviewed.  His artificial effects, regular rhythm, seem to be sinus rhythm, QTc 420, early R wave progression   Review of Systems:   General: no fevers, chills, no body weight gain, fatigue HEENT: no blurry vision, hearing changes or sore throat Respiratory: no dyspnea, coughing, wheezing CV: no chest pain, no palpitations GI: no nausea, vomiting, abdominal pain, diarrhea, constipation GU: no dysuria, burning on urination, increased urinary frequency, hematuria  Ext: no leg edema Neuro:  no vision change or hearing loss. Has slurred speech, right arm weakness Skin: no rash, no skin tear. MSK: No muscle spasm, no deformity, no limitation of range of movement in spin Heme: No easy bruising.  Travel history: No recent long distant travel.   Allergy: No Known Allergies  Past Medical History:  Diagnosis Date   Alcohol abuse    Dementia (HCC)    GERD (gastroesophageal reflux disease)    HLD (hyperlipidemia)    HTN (hypertension)    Seizures (HCC)    Stroke [facility])     Past Surgical History:  Procedure Laterality Date   skin cyst removal  Social History:  reports that he quit smoking about 16 months ago. His smoking use included cigarettes. He has a 34.00 pack-year smoking history. He has never used smokeless tobacco. He reports that he does not currently use alcohol. He reports that he does not use drugs.  Family History:  Family History  Problem Relation Age of Onset   Stroke Mother     Cancer Brother      Prior to Admission medications   Medication Sig Start Date End Date Taking? Authorizing Provider  amLODipine (NORVASC) 5 MG tablet Take 1 tablet (5 mg total) by mouth daily. 05/12/21   Sreenath, Jonelle Sports, MD  apixaban (ELIQUIS) 5 MG TABS tablet Take 1 tablet (5 mg total) by mouth 2 (two) times daily. 05/29/22   Lurene Shadow, MD  atorvastatin (LIPITOR) 20 MG tablet Take 20 mg by mouth every evening.    [provider]  food thickener (SIMPLYTHICK, NECTAR/LEVEL 2/MILDLY THICK,) GEL Take 1 packet by mouth as needed. 04/29/22   Swayze, Ava, DO  levETIRAcetam (KEPPRA) 500 MG tablet Take 1 tablet (500 mg total) by mouth 2 (two) times daily. 12/01/20   Danford, Earl Lites, MD  losartan (COZAAR) 50 MG tablet Take 50 mg by mouth daily. 02/25/22   [provider]  metoprolol succinate (TOPROL-XL) 25 MG 24 hr tablet Take 0.5 tablets (12.5 mg total) by mouth daily. 04/30/22   Swayze, Ava, DO  pantoprazole (PROTONIX) 40 MG tablet Take 1 tablet (40 mg total) by mouth 2 (two) times daily. After sixty days the patient is to continue protonix at 40 mg daily. Patient taking differently: Take 40 mg by mouth daily. 04/29/22   Swayze, Ava, DO  traZODone (DESYREL) 100 MG tablet Take 1 tablet (100 mg total) by mouth at bedtime. 05/11/21   Tresa Moore, MD    Physical Exam: Vitals:   03/27/23 1342 03/27/23 1415 03/27/23 1430 03/27/23 1600  BP: 108/80 (!) 137/97 125/75 (!) 137/95  Pulse: 67 (!) 120 (!) 58 (!) 47  Resp: 19 13 (!) 21 (!) 5  Temp: (!) 97.5 F (36.4 C)     TempSrc: Oral     SpO2: 100% (!) 89% 100% 100%   General: Not in acute distress HEENT:       Eyes: PERRL, EOMI, no scleral icterus.       ENT: No discharge from the ears and nose, no pharynx injection, no tonsillar enlargement.        Neck: No JVD, no bruit, no mass felt. Heme: No neck lymph node enlargement. Cardiac: S1/S2, RRR, No murmurs, No gallops or rubs. Respiratory: No rales, wheezing, rhonchi  or rubs. GI: Soft, nondistended, nontender, no organomegaly, BS present. GU: No hematuria Ext: No pitting leg edema bilaterally. 1+DP/PT pulse bilaterally. Musculoskeletal: No joint deformities, No joint redness or warmth, no limitation of ROM in spin. Skin: No rashes.  Neuro: Lethargic, oriented to person and place, not to time.  Oriented X3, cranial nerves II-XII grossly intact, moves all extremities,  Muscle strength 4/5 in all extremities. Psych: Patient is not psychotic, no suicidal or hemocidal ideation.  Labs on Admission: I have personally reviewed following labs and imaging studies  CBC: Recent Labs  Lab 03/27/23 1409  WBC 8.9  NEUTROABS 6.2  HGB 8.3*  HCT 28.0*  MCV 84.6  PLT 297   Basic Metabolic Panel: Recent Labs  Lab 03/27/23 1409  NA 138  K 3.7  CL 112*  CO2 18*  GLUCOSE 104*  BUN 18  CREATININE 1.20  CALCIUM 9.3   GFR: Estimated Creatinine Clearance: 41 mL/min (by C-G formula based on SCr of 1.2 mg/dL). Liver Function Tests: Recent Labs  Lab 03/27/23 1409  AST 24  ALT 20  ALKPHOS 58  BILITOT 0.3  PROT 6.9  ALBUMIN 3.7   No results for input(s): "LIPASE", "AMYLASE" in the last 168 hours. No results for input(s): "AMMONIA" in the last 168 hours. Coagulation Profile: Recent Labs  Lab 03/27/23 1409  INR 1.4*   Cardiac Enzymes: No results for input(s): "CKTOTAL", "CKMB", "CKMBINDEX", "TROPONINI" in the last 168 hours. BNP (last 3 results) No results for input(s): "PROBNP" in the last 8760 hours. HbA1C: No results for input(s): "HGBA1C" in the last 72 hours. CBG: Recent Labs  Lab 03/27/23 1342  GLUCAP 103*   Lipid Profile: No results for input(s): "CHOL", "HDL", "LDLCALC", "TRIG", "CHOLHDL", "LDLDIRECT" in the last 72 hours. Thyroid Function Tests: No results for input(s): "TSH", "T4TOTAL", "FREET4", "T3FREE", "THYROIDAB" in the last 72 hours. Anemia Panel: No results for input(s): "VITAMINB12", "FOLATE", "FERRITIN", "TIBC", "IRON",  "RETICCTPCT" in the last 72 hours. Urine analysis:    Component Value Date/Time   COLORURINE YELLOW (A) 02/16/2023 2041   APPEARANCEUR HAZY (A) 02/16/2023 2041   APPEARANCEUR Clear 06/12/2014 1029   LABSPEC 1.018 02/16/2023 2041   LABSPEC 1.005 06/12/2014 1029   PHURINE 5.0 02/16/2023 2041   GLUCOSEU NEGATIVE 02/16/2023 2041   GLUCOSEU Negative 06/12/2014 1029   HGBUR NEGATIVE 02/16/2023 2041   BILIRUBINUR NEGATIVE 02/16/2023 2041   BILIRUBINUR Negative 06/12/2014 1029   KETONESUR NEGATIVE 02/16/2023 2041   PROTEINUR 30 (A) 02/16/2023 2041   NITRITE POSITIVE (A) 02/16/2023 2041   LEUKOCYTESUR LARGE (A) 02/16/2023 2041   LEUKOCYTESUR Negative 06/12/2014 1029   Sepsis Labs: @LABRCNTIP (procalcitonin:4,lacticidven:4) )No results found for this or any previous visit (from the past 240 hour(s)).   Radiological Exams on Admission: CT HEAD CODE STROKE WO CONTRAST  Result Date: 03/27/2023 CLINICAL DATA:  Code stroke.  Difficulty with speech. EXAM: CT HEAD WITHOUT CONTRAST TECHNIQUE: Contiguous axial images were obtained from the base of the skull through the vertex without intravenous contrast. RADIATION DOSE REDUCTION: This exam was performed according to the departmental dose-optimization program which includes automated exposure control, adjustment of the mA and/or kV according to patient size and/or use of iterative reconstruction technique. COMPARISON:  Head CT 02/18/2023. FINDINGS: Brain: No acute intracranial hemorrhage. Unchanged encephalomalacia in the left posterior frontal lobe, involving the facial motor and pre motor regions. Old infarct in the right parietal lobe. Unchanged moderate chronic small-vessel disease and generalized volume loss. No hydrocephalus or extra-axial collection. No mass effect or midline shift. Vascular: No hyperdense vessel or unexpected calcification. Skull: No calvarial fracture or suspicious bone lesion. Skull base is unremarkable. Sinuses/Orbits:  Unremarkable. Other: None. ASPECTS [facility] Stroke Program Early CT Score) - Ganglionic level infarction (caudate, lentiform nuclei, internal capsule, insula, M1-M3 cortex): 7 - Supraganglionic infarction (M4-M6 cortex): 2 Total score (0-10 with 10 being normal): 9 IMPRESSION: 1. No acute intracranial hemorrhage or evidence of evolving large vessel territory infarct. ASPECT score is 9. 2. Unchanged encephalomalacia in the left posterior frontal lobe, involving the right facial motor and pre motor regions. Code stroke imaging results were communicated on 03/27/2023 at 2:04 pm to provider Dr. Otelia Limes via secure text paging. Electronically Signed   By: Orvan Falconer M.D.   On: 03/27/2023 14:06      Assessment/Plan Principal Problem:   Stroke [facility]) Active Problems:   Stenosis of left carotid  artery   AF (paroxysmal atrial fibrillation) (HCC)   Seizures (HCC)   HTN (hypertension)   Dementia (HCC)   HLD (hyperlipidemia)   Assessment and Plan:  Stroke [facility]): MRI-brain showed acute infarct in the postcentral gyrus on the left. Consulted Dr. Otelia Limes of neuro.  -Admit to tele med bed as inpatient - Check carotid dopplers, MRA of brain - will hold oral Bp meds to allow permissive HTN -continue Eliquis per Dr. Otelia Limes - Statin: lipitor - fasting lipid panel and HbA1c  - swallowing screen. If fails, will get SLP - PT/OT consult - Has had a TTE within the last 12 months which showed EF 55-60%, will not repeat  Stenosis of left carotid artery: pt has high-grade left carotid arteries stenosis, likely need CEA -consulted Dr. Wyn Quaker of [facility]  AF (paroxysmal atrial fibrillation) [facility]): Heart rate 58, 120 -Eliquis -Metoprolol  Seizures (HCC) -Seizure precaution -As needed Ativan for seizure -Continue home Keppra  HTN (hypertension): -per Dr. Otelia Limes, modified permissive HTN protocol: For the next 24 hours, treat with antihypertensives only if SBP > 180.  -prn IV hydralazine for SBP> 180 -hold  amlodipine, Cozaar -Continue metoprolol which is for rate control for atrial fibrillation  HLD: - Continue his home atorvastatin  Dementia (HCC): No behavior disturbance -Fall precaution      DVT ppx: on Eliquis  Code Status: DNR per his POA  Family Communication:  Yes, patient's POA   at bed side.     Disposition Plan:  Anticipate discharge back to previous environment  Consults called:  Dr. Otelia Limes of neurology and Dr. Wyn Quaker of [facility] are consulted.  Admission status and Level of care: Telemetry Medical:    as inpt     Dispo: The patient is from: Home              Anticipated d/c is to: Home              Anticipated d/c date is: 2 days              Patient currently is not medically stable to d/c.    Severity of Illness:  The appropriate patient status for this patient is INPATIENT. Inpatient status is judged to be reasonable and necessary in order to provide the required intensity of service to ensure the patient's safety. The patient's presenting symptoms, physical exam findings, and initial radiographic and laboratory data in the context of their chronic comorbidities is felt to place them at high risk for further clinical deterioration. Furthermore, it is not anticipated that the patient will be medically stable for discharge from the hospital within 2 midnights of admission.   * I certify that at the point of admission it is my clinical judgment that the patient will require inpatient hospital care spanning beyond 2 midnights from the point of admission due to high intensity of service, high risk for further deterioration and high frequency of surveillance required.*       Date of Service 03/27/2023    Lorretta Harp [facility]   If 7PM-7AM, please contact night-coverage [email or web address] 03/27/2023, 6:00 PM

## 2023-03-27 NOTE — Consult Note (Addendum)
NEURO HOSPITALIST CONSULT NOTE   Requestig physician: Dr. Fuller Plan  Reason for Consult: Acute onset of slurred speech  History obtained from:  Family and Chart     HPI:                                                                                                                                          Richard Carter is an 82 y.o. male with a PMHx of alcohol abuse, dementia, recent left frontal lobe stroke, paroxysmal atrial fibrillation (on Eliquis 5 mg BID), HLD, HTN and seizures (on Keppra 500 mg BID) who presents to the ED after acute onset of right sided weakness and slurred speech at home. After arriving to the ED his right sided weakness had resolved, but severe dysarthria as well as prominent facial droop continued to be present. LKN 12:00 noon. Time of symptom recognition was between 12:20-12:30 PM.    He was last seen at the end of March after he presented with worsened slurred speech, worsened right arm weakness, being more confused than normal and difficult to wake from sleep. At that time he had severe dysarthria on exam. MRI at that time revealed small/punctate acute infarcts scattered within the cortex of the anterior left frontal lobe, posterior left frontal lobe, and left parietal lobe. Imaging also revealed stable high grade left carotid stenosis. Vascular Surgery was consulted and options had been discussed between them and the patient for possible treatment including CEA, carotid stenting and medical therapy alone. As patient's CT angiography of the carotid arteries at that time confirmed >75% left ICA stenosis. Surgery was decided upon as the best option given the near circumferential dense calcification associated with the stenosis.      Past Medical History:  Diagnosis Date   Alcohol abuse    Dementia (HCC)    GERD (gastroesophageal reflux disease)    HLD (hyperlipidemia)    HTN (hypertension)    Seizures (HCC)    Stroke [facility])     Past Surgical  History:  Procedure Laterality Date   skin cyst removal      Family History  Problem Relation Age of Onset   Stroke Mother    Cancer Brother             Social History:  reports that he quit smoking about 16 months ago. His smoking use included cigarettes. He has a 34.00 pack-year smoking history. He has never used smokeless tobacco. He reports that he does not currently use alcohol. He reports that he does not use drugs.  No Known Allergies  HOME MEDICATIONS:  No current facility-administered medications on file prior to encounter.   Current Outpatient Medications on File Prior to Encounter  Medication Sig Dispense Refill   amLODipine (NORVASC) 5 MG tablet Take 1 tablet (5 mg total) by mouth daily.     apixaban (ELIQUIS) 5 MG TABS tablet Take 1 tablet (5 mg total) by mouth 2 (two) times daily. 60 tablet 0   atorvastatin (LIPITOR) 20 MG tablet Take 20 mg by mouth every evening.     food thickener (SIMPLYTHICK, NECTAR/LEVEL 2/MILDLY THICK,) GEL Take 1 packet by mouth as needed. 100 packet 0   levETIRAcetam (KEPPRA) 500 MG tablet Take 1 tablet (500 mg total) by mouth 2 (two) times daily. 60 tablet 3   losartan (COZAAR) 50 MG tablet Take 50 mg by mouth daily.     metoprolol succinate (TOPROL-XL) 25 MG 24 hr tablet Take 0.5 tablets (12.5 mg total) by mouth daily. 15 tablet 0   pantoprazole (PROTONIX) 40 MG tablet Take 1 tablet (40 mg total) by mouth 2 (two) times daily. After sixty days the patient is to continue protonix at 40 mg daily. (Patient taking differently: Take 40 mg by mouth daily.) 120 tablet 0   traZODone (DESYREL) 100 MG tablet Take 1 tablet (100 mg total) by mouth at bedtime.       ROS:                                                                                                                                       Unable to obtain a comprehensive ROS due  to the patient's dementia and severe dysarthria.    Blood pressure 108/80, pulse 67, temperature (!) 97.5 F (36.4 C), temperature source Oral, resp. rate 19, SpO2 100 %.   General Examination:                                                                                                       Physical Exam HEENT- [place]/AT   Lungs- Respirations unlabored Extremities- Warm and well-perfused  Neurological Examination Mental Status: Awake and alert. Severely dysarthric but understandable speech if listening carefully and often requiring the patient to repeat himself. No dysfluency noted in this context. Naming and repetition are intact. Comprehension is intact. He is oriented to self, the city and the state, but is not oriented to the correct month, year or day of the week.  Cranial Nerves: II: Temporal visual fields intact with no extinction to DSS. PERRL. III,IV, VI: No ptosis. EOMI. No  nystagmus. Saccadic visual pursuits are noted.  V: Temp sensation is equal.  VII: Prominent right facial droop VIII: Hearing intact to voice IX,X: Mildly hypophonic speech XI: Head is midline XII: Midline tongue extension Motor: No drift to arms or legs on the left and right Against resistance: RUE: 5/5 LUE: 4/5 RLE: 5/5 LLE: 4+/5 Sensory: Decreased temp sensation on the left arm and leg. No extinction to DSS. Deep Tendon Reflexes: 2+ and symmetric bilateral biceps and brachioradialis. 1+ bilateral patellae. 0 bilateral achilles Plantars: Equivocal bilaterally Cerebellar: No ataxia with FNF bilaterally, but slow. H-S intact bilaterally Gait: Deferred Other: No jerking, twitching or other seizure-like activity noted.   NIHSS: 3   Lab Results: Basic Metabolic Panel: No results for input(s): "NA", "K", "CL", "CO2", "GLUCOSE", "BUN", "CREATININE", "CALCIUM", "MG", "PHOS" in the last 168 hours.  CBC: No results for input(s): "WBC", "NEUTROABS", "HGB", "HCT", "MCV", "PLT" in the last 168  hours.  Cardiac Enzymes: No results for input(s): "CKTOTAL", "CKMB", "CKMBINDEX", "TROPONINI" in the last 168 hours.  Lipid Panel: No results for input(s): "CHOL", "TRIG", "HDL", "CHOLHDL", "VLDL", "LDLCALC" in the last 168 hours.  Imaging: No results found.  TTE 04/22/22: 1. Left ventricular ejection fraction, by estimation, is 55 to 60%. The  left ventricle has normal function. The left ventricle has no regional  wall motion abnormalities. Left ventricular diastolic parameters are  indeterminate.   2. Right ventricular systolic function is normal. The right ventricular  size is normal.   3. The mitral valve is normal in structure. Mild to moderate mitral valve  regurgitation. No evidence of mitral stenosis.   4. The aortic valve is normal in structure. Aortic valve regurgitation is  not visualized. No aortic stenosis is present.   5. The inferior vena cava is normal in size with greater than 50%  respiratory variability, suggesting right atrial pressure of 3 mmHg.   CTA from March 2024: 1. The small acute infarcts seen on the recent brain MRI are not well seen on the current study. No new acute intracranial pathology. 2. Stable CTA head/neck since 06/27/2022 with no new emergent finding. 3. Unchanged bulky plaque in the distal left common carotid artery resulting in high-grade stenosis. Plaque extends into the proximal internal carotid artery resulting in approximately 50% stenosis. The distal internal carotid artery is patent. Calcified plaque at the right carotid bifurcation without hemodynamically significant stenosis, and moderate stenosis of the origin of the right vertebral artery. 4. Unchanged calcified plaque in the carotid siphons without high-grade stenosis, and diminutive right V4 segment.   Assessment: 82 year old male with history of left frontal lobe stroke presenting with acute onset of right sided weakness and dysarthria. Right sided weakness resolved at time  of Neurology assessment, but there is subtle left sided weakness as well as severe dysarthria.  - Exam reveals subtle left arm and leg weakness, prominent right facial droop and severe dysarthria. No visual field cut, hemineglect, expressive or receptive aphasia noted.  -  CT head: No acute intracranial hemorrhage or evidence of evolving large vessel territory infarct. ASPECT score is 9. Unchanged encephalomalacia in the left posterior frontal lobe, corresponding to the motor and pre motor regions controlling right facial movement. - Overall presentation is most consistent with a new stroke, possibly right basal ganglia.  - He is not a TNK candidate due to anticoagulation and recent stroke.  - Not a candidate for thrombectomy as exam findings are not consistent with LVO.   Recommendations: - MRI brain  -  MRA head - Carotid ultrasound - Vascular Surgery consult. May benefit from CEA this admission.  - Has had a TTE within the last 12 months - PT/OT/Speech - Cardiac telemetry - Continue Eliquis for his PAF - Continue Keppra for his seizure disorder - Frequent neuro checks - NPO until passes stroke swallow screen - Modified permissive HTN protocol: For the next 24 hours, treat with antihypertensives only if SBP > 180.  - Continue his home atorvastatin  Addendum: - MRI brain: Punctate cortically based acute infarct in the postcentral gyrus on the left. Moderate to severe diffuse cerebral atrophy. Sequela of severe chronic microvascular ischemic change. Chronic left basal ganglia hemorrhage. Chronic medium sized left MCA stroke versus focally confluent severe chronic small vessel disease.  - Continue Eliquis - Vascular Surgery service has been consulted    Electronically signed: Dr. Caryl Pina 03/27/2023, 1:48 PM

## 2023-03-27 NOTE — ED Notes (Signed)
In and out cath attempted with no success due to resistance in the prostate x2 attempts. Patient tolerated with moderate discomfort. Bladder scan revealing . MD Clyde Lundborg notified of difficulty.

## 2023-03-27 NOTE — ED Provider Notes (Signed)
[facility] Provider Note    Event Date/Time   First MD Initiated Contact with Patient 03/27/23 1344     (approximate)   History   Aphasia   HPI  Richard Carter is a 82 y.o. male with history of recurrent stroke on Eliquis, dementia who comes in with sudden onset of difficulties with speech.  According to triage patient had slurred speech since 12:30 PM on 03/27/2023.  This was when patient was working with speech therapy and they noticed sudden onset of worsening speech.  Patient's POA, neighbor is now at bedside he does report that he left around 12:00 and patient was normal he got back around 12:20 PM and he noted the abnormal speech.  He also reportedly had some right-sided weakness as well.  The weakness has since resolved and he has some sensation changes on the right patient's POA, neighbor is now at bedside he does report that he left around 12:00 and patient was normal he got back around 12:20 PM and he noted the abnormal speech.  He also reportedly had some right-sided weakness as well.  The weakness has since resolved and he has some sensation changes on the right.  They are planned with vascular to have a procedure done on his carotids it sounds like given his recurrent history of strokes..  Patient denies any falls hitting his head, denies any chest pain, shortness of breath   Physical Exam   Triage Vital Signs: ED Triage Vitals [03/27/23 1342]  Enc Vitals Group     BP 108/80     Pulse Rate 67     Resp 19     Temp (!) 97.5 F (36.4 C)     Temp Source Oral     SpO2 100 %     Weight      Height      Head Circumference      Peak Flow      Pain Score      Pain Loc      Pain Edu?      Excl. in GC?     Most recent vital signs: Vitals:   03/27/23 1342  BP: 108/80  Pulse: 67  Resp: 19  Temp: (!) 97.5 F (36.4 C)  SpO2: 100%     General: Awake, no distress.  CV:  Good peripheral perfusion.  Resp:  Normal effort.  Abd:  No  distention.  Other:  Slurred speech noted.  Equal strength in arms and legs   ED Results / Procedures / Treatments   Labs (all labs ordered are listed, but only abnormal results are displayed) Labs Reviewed  CBG MONITORING, ED - Abnormal; Notable for the following components:      Result Value   Glucose-Capillary 103 (*)    All other components within normal limits  PROTIME-INR  APTT  CBC  DIFFERENTIAL  COMPREHENSIVE METABOLIC PANEL  ETHANOL  I-STAT CREATININE, ED  CBG MONITORING, ED     EKG  My interpretation of EKG:  Normal sinus rate of 60 without any ST elevation or T wave inversions, normal intervals  RADIOLOGY I have reviewed the CT had personally interpreted no evidence of intercranial hemorrhage  PROCEDURES:  Critical Care performed: No  .1-3 Lead EKG Interpretation  Performed by: Concha Se, MD Authorized by: Concha Se, MD     Interpretation: normal     ECG rate:  60   ECG rate assessment: normal     Rhythm: sinus  rhythm     Ectopy: none     Conduction: normal   .Critical Care  Performed by: Concha Se, MD Authorized by: Concha Se, MD   Critical care provider statement:    Critical care time (minutes):  30   Critical care was necessary to treat or prevent imminent or life-threatening deterioration of the following conditions:  CNS failure or compromise   Critical care was time spent personally by me on the following activities:  Development of treatment plan with patient or surrogate, discussions with consultants, evaluation of patient's response to treatment, examination of patient, ordering and review of laboratory studies, ordering and review of radiographic studies, ordering and performing treatments and interventions, pulse oximetry, re-evaluation of patient's condition and review of old charts    MEDICATIONS ORDERED IN ED: Medications  sodium chloride flush (NS) 0.9 % injection 3 mL (has no administration in time range)      IMPRESSION / MDM / ASSESSMENT AND PLAN / ED COURSE  I reviewed the triage vital signs and the nursing notes.   Patient's presentation is most consistent with acute presentation with potential threat to life or bodily function.   Differential ischemic stroke, hemorrhagic stroke, brain mass, EtOH abuse, Electra abnormalities, UTI.  Stroke code called from triage but patient is not a TNK candidate given on Eliquis.  CT head was negative. Ethanol negative.  Hemoglobin around baseline.  Creatinine around baseline.  I discussed the case with Dr. Otelia Limes to make sure he was aware the patient did have right-sided weakness.  This weakness has now since resolved.  I asked him if he would like to do CT angio to rule out LVO but he was okay with holding off on CT angio given low concern for LVO.  He did want me to order MRI, MRA.  I asked if he would like these to be resulted prior to admission and he stated that it was okay for me to admit him with the still pending.  Patient has had potentially new stroke versus TIA with known significant vessel disease and I think he would benefit from inpatient vascular consultation to see if they can help move up his surgery may be while in the hospital.  Will discuss with hospital team for admission with these results pending.  I reevaluated patient continues to have no weakness on one side.  He does still have some slurred speech but does report that it is improving  The patient is on the cardiac monitor to evaluate for evidence of arrhythmia and/or significant heart rate changes.      FINAL CLINICAL IMPRESSION(S) / ED DIAGNOSES   Final diagnoses:  Speech disturbance, unspecified type     Rx / DC Orders   ED Discharge Orders     None        Note:  This document was prepared using Dragon voice recognition software and may include unintentional dictation errors.   Concha Se, MD 03/27/23 1536

## 2023-03-27 NOTE — ED Notes (Signed)
Transport arranged to floor

## 2023-03-28 ENCOUNTER — Inpatient Hospital Stay: Payer: Medicare HMO

## 2023-03-28 DIAGNOSIS — Z7901 Long term (current) use of anticoagulants: Secondary | ICD-10-CM | POA: Diagnosis not present

## 2023-03-28 DIAGNOSIS — Z87891 Personal history of nicotine dependence: Secondary | ICD-10-CM

## 2023-03-28 DIAGNOSIS — I639 Cerebral infarction, unspecified: Secondary | ICD-10-CM | POA: Diagnosis not present

## 2023-03-28 DIAGNOSIS — F1011 Alcohol abuse, in remission: Secondary | ICD-10-CM | POA: Diagnosis not present

## 2023-03-28 DIAGNOSIS — I6522 Occlusion and stenosis of left carotid artery: Secondary | ICD-10-CM | POA: Diagnosis not present

## 2023-03-28 DIAGNOSIS — R479 Unspecified speech disturbances: Secondary | ICD-10-CM | POA: Diagnosis not present

## 2023-03-28 LAB — BASIC METABOLIC PANEL
Anion gap: 7 (ref 5–15)
BUN: 18 mg/dL (ref 8–23)
CO2: 21 mmol/L — ABNORMAL LOW (ref 22–32)
Calcium: 9.2 mg/dL (ref 8.9–10.3)
Chloride: 111 mmol/L (ref 98–111)
Creatinine, Ser: 1.08 mg/dL (ref 0.61–1.24)
GFR, Estimated: >60 mL/min (ref >60)
Glucose, Bld: 89 mg/dL (ref 70–99)
Potassium: 3.3 mmol/L — ABNORMAL LOW (ref 3.5–5.1)
Sodium: 139 mmol/L (ref 135–145)

## 2023-03-28 LAB — CBC
HCT: 25.2 % — ABNORMAL LOW (ref 39.0–52.0)
Hemoglobin: 7.9 g/dL — ABNORMAL LOW (ref 13.0–17.0)
MCH: 25.6 pg — ABNORMAL LOW (ref 26.0–34.0)
MCHC: 31.3 g/dL (ref 30.0–36.0)
MCV: 81.6 fL (ref 80.0–100.0)
Platelets: 275 10*3/uL (ref 150–400)
RBC: 3.09 MIL/uL — ABNORMAL LOW (ref 4.22–5.81)
RDW: 16.4 % — ABNORMAL HIGH (ref 11.5–15.5)
WBC: 7.1 10*3/uL (ref 4.0–10.5)
nRBC: 0 % (ref 0.0–0.2)

## 2023-03-28 LAB — PROTIME-INR
INR: 1.5 — ABNORMAL HIGH (ref 0.8–1.2)
Prothrombin Time: 17.6 seconds — ABNORMAL HIGH (ref 11.4–15.2)

## 2023-03-28 LAB — LIPID PANEL
Cholesterol: 120 mg/dL (ref 0–200)
HDL: 40 mg/dL — ABNORMAL LOW (ref >40)
LDL Cholesterol: 65 mg/dL (ref 0–99)
Total CHOL/HDL Ratio: 3 RATIO
Triglycerides: 74 mg/dL (ref <150)
VLDL: 15 mg/dL (ref 0–40)

## 2023-03-28 LAB — HEPARIN LEVEL (UNFRACTIONATED): Heparin Unfractionated: >1.1 IU/mL — ABNORMAL HIGH (ref 0.30–0.70)

## 2023-03-28 LAB — APTT: aPTT: 41 seconds — ABNORMAL HIGH (ref 24–36)

## 2023-03-28 MED ORDER — HEPARIN (PORCINE) 25000 UT/250ML-% IV SOLN
650.0000 [IU]/h | INTRAVENOUS | Status: DC
  Administered 2023-03-28: 900 [IU]/h via INTRAVENOUS
  Filled 2023-03-28: qty 250

## 2023-03-28 MED ORDER — PANTOPRAZOLE SODIUM 40 MG IV SOLR
40.0000 mg | INTRAVENOUS | Status: DC
  Administered 2023-03-29 – 2023-04-04 (×6): 40 mg via INTRAVENOUS
  Filled 2023-03-28 (×6): qty 10

## 2023-03-28 MED ORDER — LEVETIRACETAM IN NACL 500 MG/100ML IV SOLN
500.0000 mg | Freq: Two times a day (BID) | INTRAVENOUS | Status: DC
  Administered 2023-03-28 – 2023-04-01 (×8): 500 mg via INTRAVENOUS
  Filled 2023-03-28 (×9): qty 100

## 2023-03-28 MED ORDER — POTASSIUM CHLORIDE CRYS ER 20 MEQ PO TBCR
40.0000 meq | EXTENDED_RELEASE_TABLET | Freq: Once | ORAL | Status: AC
  Administered 2023-03-28: 40 meq via ORAL
  Filled 2023-03-28: qty 2

## 2023-03-28 NOTE — Hospital Course (Signed)
Richard Carter is a 83 y.o. 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  Found to have recurrent stroke.  Neurology and vascular surgery involved.  Plan for left CEA on 5/7.  Currently on IV heparin.  SLP recommended n.p.o. status due to high risk for aspiration.

## 2023-03-28 NOTE — Evaluation (Signed)
Occupational Therapy Evaluation Patient Details Name: Richard Carter MRN: [id number] DOB: 30-Dec-1940 Today's Date: 03/28/2023   History of Present Illness Pt is an 82 y.o. male presenting to hospital 03/27/23 with sudden onset of difficulties with speech; R sided weakness also noted but resolved; R sided sensation changes noted as well as facial droop.  Imaging showing acute infarct in the postcentral gyrus on L.  Pt admitted with stroke, stenosis of L carotid artery, a-fib, and htn.  Recent hospitalization 3/24-3/29 for stroke.  PMH includes alcohol abuse, dementia, htn, seizures, stroke (with L sided hemiparesis), a-fib, AKI, pubic ramus and acetabular fx 2020, UTI, chronic L middle finger contracture.  Per notes pt also with h/o R UE weakness.   Clinical Impression   Patient received for OT evaluation. See flowsheet below for details of function. Generally, patient requiring supervision for bed mobility, MIN A-CGA with RW for functional mobility, and set up-MOD A for ADLs. Patient will benefit from continued OT while in acute care.       Recommendations for follow up therapy are one component of a multi-disciplinary discharge planning process, led by the attending physician.  Recommendations may be updated based on patient status, additional functional criteria and insurance authorization.   Assistance Recommended at Discharge Frequent or constant Supervision/Assistance  Patient can return home with the following A little help with walking and/or transfers;A lot of help with bathing/dressing/bathroom;Assistance with cooking/housework;Direct supervision/assist for medications management;Direct supervision/assist for financial management;Assist for transportation;Help with stairs or ramp for entrance    Functional Status Assessment  Patient has had a recent decline in their functional status and demonstrates the ability to make significant improvements in function in a reasonable and predictable  amount of time.  Equipment Recommendations  None recommended by OT    Recommendations for Other Services       Precautions / Restrictions Precautions Precautions: Fall Precaution Comments: Seizure precautions Restrictions Weight Bearing Restrictions: No      Mobility Bed Mobility Overal bed mobility: Needs Assistance Bed Mobility: Supine to Sit     Supine to sit: Supervision, HOB elevated     General bed mobility comments: increased time, cues for use of bedrail.    Transfers Overall transfer level: Needs assistance Equipment used: Rolling walker (2 wheels) Transfers: Sit to/from Stand Sit to Stand: Min assist           General transfer comment: vc's for UE placement; assist to steady      Balance Overall balance assessment: Needs assistance Sitting-balance support: No upper extremity supported, Feet supported Sitting balance-Leahy Scale: Good Sitting balance - Comments: steady seated EOB   Standing balance support: Bilateral upper extremity supported, During functional activity, Reliant on assistive device for balance Standing balance-Leahy Scale: Poor Standing balance comment: assist for balance during ambulation                           ADL either performed or assessed with clinical judgement   ADL Overall ADL's : Needs assistance/impaired Eating/Feeding: Set up;Cueing for safety;Cueing for compensatory techinques;Sitting Eating/Feeding Details (indicate cue type and reason): Pt sat at EOB for self-feeding, as OT received him in the bed and unable to effectively reach forward with UE to tray table in front of him; much more effective positioning from seated EOB.  General ADL Comments: Pt busy with eating lunch during today's session, so did not get full assessment of all ADL capabilities, but anticipate need for CGA during all transfers and at least MIN A for most ADLs from seated/standing  position due to decreased balance. Pt with forward flexed posture during standing and sidestepping with RW to chair today. Gait belt used for safety.     Vision   Additional Comments: No apparent change from baseline, but difficult to assess due to dysarthria     Perception     Praxis      Pertinent Vitals/Pain Pain Assessment Pain Assessment: No/denies pain     Hand Dominance Right   Extremity/Trunk Assessment Upper Extremity Assessment Upper Extremity Assessment: RUE deficits/detail;LUE deficits/detail RUE Deficits / Details: Pt with difficulty getting more than a few degrees of shoulder flexion RUE (dominant side); pt was able to use elbow and forearm effectively for self-feeding, but had to lean forward at trunk to get face to silverware. LUE Deficits / Details: shoulder flexion to approx 70 degrees   Lower Extremity Assessment Lower Extremity Assessment: Generalized weakness;Defer to PT evaluation RLE Deficits / Details: hip flexion 4/5, knee flexion/extension 4/5, DF 4/5 LLE Deficits / Details: hip flexion 4/5, knee flexion/extension 4/5, DF 4/5   Cervical / Trunk Assessment Cervical / Trunk Assessment: Normal   Communication Communication Communication: Expressive difficulties   Cognition Arousal/Alertness: Awake/alert Behavior During Therapy: WFL for tasks assessed/performed Overall Cognitive Status: History of cognitive impairments - at baseline (Did not know date/time, did know "hospital" and self.)                                       General Comments  some coughing noted during eating; pt has SLP consult ordered    Exercises     Shoulder Instructions      Home Living Family/patient expects to be discharged to:: Private residence Living Arrangements: Non-relatives/Friends Available Help at Discharge: Available 24 hours/day;Neighbor;Home health (pt received HH SLP, OT, and PT) Type of Home: Mobile home Home Access: Ramped entrance      Home Layout: One level     Bathroom Shower/Tub: Producer, television/film/video: Handicapped height Bathroom Accessibility: Yes   Home Equipment: Grab bars - tub/shower;Shower seat;Grab bars - toilet;Rolling Walker (2 wheels);BSC/3in1;Wheelchair - manual   Additional Comments: Pt typically sleeps on sofa, per caregiver report      Prior Functioning/Environment Prior Level of Function : Needs assist             Mobility Comments: Modified independent ambulating with RW.  No falls in last 6 months. ADLs Comments: Network engineer (caregiver) reports he shares POA with pt's son and assists pt with ADLs/IADLs (dressing, bathing, medications, meals, cleaning, etc)--assists pt 7 days a week and assists 12 plus hours a day. States that pt typically does toileting, grooming, and eating independently.        OT Problem List: Decreased strength;Decreased activity tolerance;Impaired balance (sitting and/or standing);Decreased cognition;Decreased safety awareness      OT Treatment/Interventions: Self-care/ADL training;Therapeutic exercise;Therapeutic activities;Cognitive remediation/compensation    OT Goals(Current goals can be found in the care plan section) Acute Rehab OT Goals Patient Stated Goal: none stated OT Goal Formulation: Patient unable to participate in goal setting Time For Goal Achievement: 04/11/23 Potential to Achieve Goals: Good ADL Goals Pt Will Perform Grooming: with modified independence;standing Pt Will Perform Lower Body Bathing: with  supervision;sitting/lateral leans Pt Will Transfer to Toilet: with modified independence;regular height toilet;ambulating Pt Will Perform Toileting - Clothing Manipulation and hygiene: with modified independence;sit to/from stand  OT Frequency: Min 2X/week    Co-evaluation              AM-PAC OT "6 Clicks" Daily Activity     Outcome Measure Help from another person eating meals?: None Help from another person taking care of  personal grooming?: A Little Help from another person toileting, which includes using toliet, bedpan, or urinal?: A Lot Help from another person bathing (including washing, rinsing, drying)?: A Lot Help from another person to put on and taking off regular upper body clothing?: A Little Help from another person to put on and taking off regular lower body clothing?: A Lot 6 Click Score: 16   End of Session Equipment Utilized During Treatment: Gait belt;Rolling walker (2 wheels) Nurse Communication: Mobility status;Other (comment) (communicated with NT about pt status being in chair with chair alarm on and tray table in front)  Activity Tolerance: Patient tolerated treatment well Patient left: in chair;with call bell/phone within reach;with chair alarm set  OT Visit Diagnosis: Unsteadiness on feet (R26.81);Other abnormalities of gait and mobility (R26.89)                Time: 1350-1414 OT Time Calculation (min): 24 min Charges:  OT General Charges $OT Visit: 1 Visit OT Evaluation $OT Eval Moderate Complexity: 1 Mod OT Treatments $Self Care/Home Management : 8-22 mins  Linward Foster, MS, OTR/L  Alvester Morin 03/28/2023, 4:34 PM

## 2023-03-28 NOTE — Consult Note (Addendum)
ANTICOAGULATION CONSULT NOTE - Initial Consult  Pharmacy Consult for heparin Indication: atrial fibrillation ISO recent stroke  No Known Allergies  Patient Measurements: Height: 5' 7.01" (170.2 cm) Weight: 62.1 kg (136 lb 14.5 oz) IBW/kg (Calculated) : 66.12 Heparin Dosing Weight: 62.1  Vital Signs: Temp: 97.8 F (36.6 C) (05/03 1543) Temp Source: Oral (05/03 1543) BP: 130/68 (05/03 1543) Pulse Rate: 69 (05/03 1543)  Labs: Recent Labs    03/27/23 1409 03/27/23 1920 03/28/23 0547  HGB 8.3*  --  7.9*  HCT 28.0*  --  25.2*  PLT 297  --  275  APTT 38* 39*  --   LABPROT 17.1*  --   --   INR 1.4*  --   --   CREATININE 1.20  --  1.08    Estimated Creatinine Clearance: 45.5 mL/min (by C-G formula based on SCr of 1.08 mg/dL).   Medical History: Past Medical History:  Diagnosis Date   Alcohol abuse    Dementia (HCC)    GERD (gastroesophageal reflux disease)    HLD (hyperlipidemia)    HTN (hypertension)    Seizures (HCC)    Stroke (HCC)     Medications:  Apixaban 5 mg PO twice daily (last dose 5/2 @ 2956)  Assessment: 82 y.o. male with PMH including stroke, CAD, Afib on Eliquis, HTN, HLD, anemia who presents with slurred speech/CVA. Onset of symptoms on 5/2 @ 1230. Also initially had right arm weakness which has now resolved. Pt is scheduled for open left CEA on 5/7 and pharmacy has been consulted to manage peri-operative anticoagulation with heparin. Provider requested to omit bolus ISO recent stroke and communicated that plan is to stop the heparin 6 hrs before CEA planned 5/7.  Baseline Labs: aPTT - 41; INR - 1.5; Hgb - 7.9; PLT - 275  Date Time aPTT/HL Rate/Comment 0503 1724 41/>1.10 Baseline   Goal of Therapy:  Heparin level 0.3-0.5 units/ml aPTT 66-85 seconds Monitor platelets by anticoagulation protocol: Yes   Plan:  Last dose of apixaban on 5/3 at 0933 Start heparin infusion at 900 units/hr No bolus per MD Check aPTT/Anti-Xa level in 8 hours and  daily once consecutively therapeutic.  Titrate by aPTT's until lab correlation is noted, then titrate by anti-xa alone. Continue to monitor H&H and platelets daily while on heparin gtt.  Will M. Dareen Piano, PharmD PGY-1 Pharmacy Resident 03/28/2023 5:59 PM

## 2023-03-28 NOTE — Progress Notes (Signed)
[facility] Progress Note Patient: Richard Carter UEA:[id number] DOB: [birth year] DOA: 03/27/2023  DOS: the patient was seen and examined on 03/28/2023  Brief hospital course: AMAURIE Carter is a 82 y.o. 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  Found to have recurrent stroke.  Neurology and vascular surgery involved.  Plan for left CEA on 5/7.  Currently on IV heparin.  SLP recommended n.p.o. status due to high risk for aspiration. Assessment and Plan: Acute ischemic stroke MRI-brain showed acute infarct in the postcentral gyrus on the left. Neurology following. Carotid Dopplers unchanged from prior stenosis. Currently allowing permissive hypertension. Patient was on Eliquis.  Due to n.p.o. status will be switched to heparin. Cholesterol medications currently on hold due to n.p.o. status. SLP was consulted.  Initially patient was cleared for oral diet based on his bedside swallowing evaluation but due to ongoing concerns with cough while eating SLP was consulted for bedside swallowing evaluation and currently recommending n.p.o. status. PT OT were also consulted. Monitor on telemetry. Recently had an echocardiogram with preserved EF without any wall motion abnormality.   Stenosis of left carotid artery:  pt has high-grade left carotid arteries stenosis, likely need CEA -consulted Dr. Wyn Quaker of [facility] Scheduled for outpatient procedure.  Currently scheduled for left CEA on 5/7. Holding Eliquis and switching to IV heparin.   paroxysmal atrial fibrillation Heart rate 58, 120 Currently rate controlled. Continuing metoprolol. Eliquis on hold and switch to heparin.   Seizures (HCC) -Seizure precaution -As needed Ativan for seizure -Continue home Keppra, now IV.   HTN  (hypertension): Allowing permissive hypertension. -hold amlodipine, Cozaar -Continue metoprolol which is for rate control for atrial fibrillation   HLD: - Continue his home atorvastatin   Dementia (HCC): No behavior disturbance -Fall precaution   Subjective: No nausea no vomiting no fever no chills.  Reported to have multiple coughing spells after eating.  No chest pain.  Physical Exam: General: in Mild distress, No Rash Cardiovascular: S1 and S2 Present, No Murmur Respiratory: Good respiratory effort, Bilateral Air entry present.  Bilateral crackles, No wheezes Abdomen: Bowel Sound present, No tenderness Extremities: Trace edema Neuro: Alert and oriented x self and location, speech slurred, no new focal deficit  Data Reviewed: I have Reviewed nursing notes, Vitals, and Lab results. Since last encounter, pertinent lab results CBC and BMP   . I have ordered test including CBC and BMP  . I have discussed pt's care plan and test results with neurology and vascular surgery  .   Disposition: Status is: Inpatient Remains inpatient appropriate because: Need for further workup.  Currently on IV heparin.  Will need CEA on 5/7.  Family Communication: Discussed with POA at bedside. Level of care: Telemetry Medical   Vitals:   03/28/23 0719 03/28/23 1136 03/28/23 1543 03/28/23 1600  BP: 137/75 135/62 130/68   Pulse:  (!) 53 69   Resp: 20 16 16    Temp: 97.8 F (36.6 C) 97.7 F (36.5 C) 97.8 F (36.6 C)   TempSrc:   Oral   SpO2: 96% 100% 100%   Weight:    62.1 kg  Height:    5' 7.01" (1.702 m)     Author: Lynden Oxford, MD 03/28/2023 6:04 PM  Please look on [email or web address] to find out who is on call.

## 2023-03-28 NOTE — Evaluation (Signed)
Clinical/Bedside Swallow Evaluation Patient Details  Name: Richard Carter MRN: [id number] Date of Birth: 31-May-1941  Today's Date: 03/28/2023 Time: SLP Start Time (ACUTE ONLY): 1255 SLP Stop Time (ACUTE ONLY): 1311 SLP Time Calculation (min) (ACUTE ONLY): 16 min  Past Medical History:  Past Medical History:  Diagnosis Date   Alcohol abuse    Dementia (HCC)    GERD (gastroesophageal reflux disease)    HLD (hyperlipidemia)    HTN (hypertension)    Seizures (HCC)    Stroke [facility])    Past Surgical History:  Past Surgical History:  Procedure Laterality Date   skin cyst removal     HPI:  This is a 82 y.o. male  Richard Carter is a 82 y.o. 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 Patient was recently hospitalized from 3/24-3/29 due to stroke.  Patient was found to have high-grade left carotid artery stenosis.  Dr. Gilda Crease of [facility] is following up with the patient. Patient was seen and in the process of bring scheduled outpatient for an Open Carotid Endarterectomy in the coming weeks. Current MRI revealed Acute infarct in the postcentral gyrus on the left 2. Sequela of severe chronic microvascular ischemic change with advanced generalized volume and likely a chronic left basal ganglia  hemorrhage.    Assessment / Plan / Recommendation  Clinical Impression   Pt is known to ST services for treatment of dysphagia. During previous admission (02/17/2023) pt discharged on dysphaiga 1 diet with nectar thick iquids. At this admission, pt placed on regular with thin liquids. Pt observed with thin liquids at bedside and pt's nurse stated that he consumed his morning medicines whole with thin liquids without "any issues." given this report, SLP provided skilled observation of pt consuming  thin liquids via small cup sip.  Pt presents with what is likely acute on chronic dysarthria c/b moderate generalized flaccid dysarthria resulting in < 50% speech intelligibility at the sentence level. Suspect that pt's oral weakness plays a part in his oral control of boluses and posterior oral containment of boluses. As such when consuming thin liquids via cup and nectar thick liquids via spoon and cup, pt with immediate coughing. When consuming nectar thick liquids via cup, his oral deficits were apparent when some of the bolus was projected anteriorally onto his chest. Given pt's current s/s of aspiration, recommend he be NPO with further assessment during current hospitalization. ST will follow. Pt might benefit from having an instrumental swallow evaluation.     SLP Visit Diagnosis: Dysphagia, oropharyngeal phase (R13.12)    Aspiration Risk  Severe aspiration risk    Diet Recommendation NPO   Medication Administration: Via alternative means    Other  Recommendations Oral Care Recommendations: Oral care QID    Recommendations for follow up therapy are one component of a multi-disciplinary discharge planning process, led by the attending physician.  Recommendations may be updated based on patient status, additional functional criteria and insurance authorization.  Follow up Recommendations Follow physician's recommendations for discharge plan and follow up therapies      Assistance Recommended at Discharge    Functional Status Assessment Patient has had a recent decline in their functional status and/or demonstrates limited ability to make significant improvements in function in a reasonable and predictable amount of time  Frequency and Duration min 2x/week  2 weeks  Prognosis Prognosis for improved oropharyngeal function: Fair Barriers to Reach Goals: Cognitive deficits;Severity of deficits (chronic dysphagia)      Swallow Study   General Date of Onset: 03/27/23 HPI:  This is a 82 y.o. male  Richard Carter is a 82 y.o. 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 Patient was recently hospitalized from 3/24-3/29 due to stroke.  Patient was found to have high-grade left carotid artery stenosis.  Dr. Gilda Crease of [facility] is following up with the patient. Patient was seen and in the process of bring scheduled outpatient for an Open Carotid Endarterectomy in the coming weeks. Current MRI revealed Acute infarct in the postcentral gyrus on the left 2. Sequela of severe chronic microvascular ischemic change with advanced generalized volume and likely a chronic left basal ganglia  hemorrhage. Type of Study: Bedside Swallow Evaluation Previous Swallow Assessment: BSE during most recent hospitalization last month Diet Prior to this Study: Regular;Thin liquids (Level 0) Temperature Spikes Noted: No Respiratory Status: Room air History of Recent Intubation: No Behavior/Cognition: Alert;Cooperative;Pleasant mood Oral Cavity Assessment: Within Functional Limits Oral Care Completed by SLP: No Oral Cavity - Dentition: Adequate natural dentition Vision: Functional for self-feeding Self-Feeding Abilities: Needs assist;Needs set up;Total assist Patient Positioning: Upright in bed Baseline Vocal Quality: Low vocal intensity Volitional Cough: Strong Volitional Swallow: Able to elicit    Oral/Motor/Sensory Function Overall Oral Motor/Sensory Function: Generalized oral weakness (suspect acute on chronic; nearly baseline ability at this time)   Ice Chips Ice chips: Not tested   Thin Liquid Thin Liquid: Impaired Presentation: Cup Pharyngeal  Phase Impairments: Suspected delayed Swallow;Decreased hyoid-laryngeal movement;Cough - Immediate    Nectar Thick Nectar Thick  Liquid: Impaired Presentation: Cup;Self Fed;Spoon Oral Phase Impairments: Reduced labial seal;Reduced lingual movement/coordination Oral phase functional implications:  (part of bolus came out anteriorly during attempt to swallow) Pharyngeal Phase Impairments: Suspected delayed Swallow;Decreased hyoid-laryngeal movement;Multiple swallows;Cough - Immediate   Honey Thick Honey Thick Liquid: Not tested   Puree Puree: Not tested   Solid     Solid: Not tested     Casanova Schurman B. Dreama Saa, M.S., CCC-SLP, Tree surgeon Certified Brain Injury Specialist [facility]  [facility] Office [phone] Ascom [phone] Fax [phone]

## 2023-03-28 NOTE — Consult Note (Signed)
Hospital Consult    Reason for Consult:  TIA  Requesting Physician:  Dr Lorretta Harp, MD MRN #:  [id number]  History of Present Illness: This is a 82 y.o. male  Richard Carter is a 82 y.o. 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 Patient was recently hospitalized from 3/24-3/29 due to stroke.  Patient was found to have high-grade left carotid artery stenosis.  Dr. Gilda Crease of [facility] is following up with the patient. Patient was seen and in the process of bring scheduled outpatient for an Open Carotid Endarterectomy in the coming weeks.  Per his POA at bedside, at about 1230, patient was noted to have slurred speech and right arm weakness. His right arm weakness has resolved.  He still has persistent slurred speech.  No facial droop.  At her normal baseline, patient is oriented to the place and person, but not to the time.  His mental status seems to be close to baseline.  He moves all extremities with symmetric weakness in all extremities.  Has chronic left middle finger contracture.  Denies chest pain, cough, shortness of breath.  No nausea, vomiting, diarrhea or abdominal pain.   Work up from prior admission 3/24-3/29 the patient has had a CT of his head with no acute intracranial hemorrhage or evidence of evolving large vessel infarct.  He is also had unchanged encephalomalacia in the left posterior frontal lobe involving the right facial motor and premotor regions.  This explains why he has right-sided facial droop.  Patient also underwent an MRI of the brain which she did not tolerate very well.  There was a lot of motion artifact which limits the evaluation of this scan.  However findings on this are acute infarct in the postcentral gyrus of the left and sequela of several chronic  microvascular ischemic changes within the left basal ganglia.    Past Medical History:  Diagnosis Date   Alcohol abuse    Dementia (HCC)    GERD (gastroesophageal reflux disease)    HLD (hyperlipidemia)    HTN (hypertension)    Seizures (HCC)    Stroke [facility])     Past Surgical History:  Procedure Laterality Date   skin cyst removal      No Known Allergies  Prior to Admission medications   Medication Sig Start Date End Date Taking? Authorizing Provider  amLODipine (NORVASC) 5 MG tablet Take 1 tablet (5 mg total) by mouth daily. 05/12/21  Yes Sreenath, Jonelle Sports, MD  apixaban (ELIQUIS) 5 MG TABS tablet Take 1 tablet (5 mg total) by mouth 2 (two) times daily. 05/29/22  Yes Lurene Shadow, MD  atorvastatin (LIPITOR) 20 MG tablet Take 20 mg by mouth every evening.   Yes [provider]  levETIRAcetam (KEPPRA) 500 MG tablet Take 1 tablet (500 mg total) by mouth 2 (two) times daily. 12/01/20  Yes Danford, Earl Lites, MD  losartan (COZAAR) 50 MG tablet Take 50 mg by mouth daily. 02/25/22  Yes [provider]  metoprolol succinate (TOPROL-XL) 25 MG 24 hr tablet Take 0.5 tablets (12.5 mg total) by mouth daily. 04/30/22  Yes Swayze, Ava, DO  pantoprazole (PROTONIX) 40 MG tablet Take 1 tablet (40 mg total) by mouth 2 (two) times daily. After sixty days the patient is to continue protonix at 40 mg daily. Patient taking differently: Take 40  mg by mouth daily. 04/29/22  Yes Swayze, Ava, DO  traZODone (DESYREL) 100 MG tablet Take 1 tablet (100 mg total) by mouth at bedtime. 05/11/21  Yes Sreenath, Sudheer B, MD  food thickener (SIMPLYTHICK, NECTAR/LEVEL 2/MILDLY THICK,) GEL Take 1 packet by mouth as needed. 04/29/22   Swayze, Ava, DO    Social History   Socioeconomic History   Marital status: Widowed    Spouse name: Not on file   Number of children: Not on file   Years of education: Not on file   Highest education level: Not on file  Occupational History   Not on file  Tobacco Use    Smoking status: Former    Packs/day: 0.50    Years: 68.00    Additional pack years: 0.00    Total pack years: 34.00    Types: Cigarettes    Quit date: 2023    Years since quitting: 1.3   Smokeless tobacco: Never  Vaping Use   Vaping Use: Never used  Substance and Sexual Activity   Alcohol use: Not Currently    Comment: Drinks about 4oz of liquor daily. Last drink was yesterday 04/19/22.   Drug use: Never   Sexual activity: Not Currently  Other Topics Concern   Not on file  Social History Narrative   Not on file   Social Determinants of Health   Financial Resource Strain: Not on file  Food Insecurity: No Food Insecurity (02/16/2023)   Hunger Vital Sign    Worried About Running Out of Food in the Last Year: Never true    Ran Out of Food in the Last Year: Never true  Transportation Needs: No Transportation Needs (02/16/2023)   PRAPARE - Administrator, Civil Service (Medical): No    Lack of Transportation (Non-Medical): No  Physical Activity: Not on file  Stress: Not on file  Social Connections: Not on file  Intimate Partner Violence: Not At Risk (02/16/2023)   Humiliation, Afraid, Rape, and Kick questionnaire    Fear of Current or Ex-Partner: No    Emotionally Abused: No    Physically Abused: No    Sexually Abused: No     Family History  Problem Relation Age of Onset   Stroke Mother    Cancer Brother     ROS: Otherwise negative unless mentioned in HPI  Physical Examination  Vitals:   03/28/23 0521 03/28/23 0719  BP: 126/69 137/75  Pulse: (!) 57   Resp: 16 20  Temp: (!) 97.4 F (36.3 C) 97.8 F (36.6 C)  SpO2: 98% 96%   There is no height or weight on file to calculate BMI.  General:  WDWN in NAD Gait: Not observed HENT: WNL, normocephalic Pulmonary: normal non-labored breathing, without Rales, rhonchi,  wheezing Cardiac: Atrial Fibrillation, without  Murmurs, rubs or gallops; with carotid bruits Abdomen: Positive Bowel Sounds,  soft,  NT/ND, no masses Skin: without rashes Vascular Exam/Pulses: Palpable Pulses throughout Extremities: without ischemic changes, without Gangrene , without cellulitis; without open wounds;  Musculoskeletal: no muscle wasting or atrophy  Neurologic: A&O X 3;  No focal weakness or paresthesias are detected; speech is slightly slurred with a history of right sided facial droop.  Psychiatric:  The pt has Abnormal- Flat  affect. Lymph:  Unremarkable  CBC    Component Value Date/Time   WBC 7.1 03/28/2023 0547   RBC 3.09 (L) 03/28/2023 0547   HGB 7.9 (L) 03/28/2023 0547   HGB 12.9 (L) 10/25/2014 1048   HCT  25.2 (L) 03/28/2023 0547   HCT 38.6 (L) 10/25/2014 1048   PLT 275 03/28/2023 0547   PLT 93 (L) 10/25/2014 1048   MCV 81.6 03/28/2023 0547   MCV 108 (H) 10/25/2014 1048   MCH 25.6 (L) 03/28/2023 0547   MCHC 31.3 03/28/2023 0547   RDW 16.4 (H) 03/28/2023 0547   RDW 19.6 (H) 10/25/2014 1048   LYMPHSABS 1.4 03/27/2023 1409   LYMPHSABS 0.8 (L) 10/25/2014 1048   MONOABS 0.8 03/27/2023 1409   MONOABS 0.9 10/25/2014 1048   EOSABS 0.4 03/27/2023 1409   EOSABS 0.0 10/25/2014 1048   BASOSABS 0.1 03/27/2023 1409   BASOSABS 0.0 10/25/2014 1048    BMET    Component Value Date/Time   NA 139 03/28/2023 0547   NA 133 (L) 10/25/2014 1048   K 3.3 (L) 03/28/2023 0547   K 3.3 (L) 10/25/2014 1048   CL 111 03/28/2023 0547   CL 92 (L) 10/25/2014 1048   CO2 21 (L) 03/28/2023 0547   CO2 30 10/25/2014 1048   GLUCOSE 89 03/28/2023 0547   GLUCOSE 125 (H) 10/25/2014 1048   BUN 18 03/28/2023 0547   BUN 18 10/25/2014 1048   CREATININE 1.08 03/28/2023 0547   CREATININE 1.02 10/25/2014 1048   CALCIUM 9.2 03/28/2023 0547   CALCIUM 8.9 10/25/2014 1048   GFRNONAA >60 03/28/2023 0547   GFRNONAA >60 10/25/2014 1048   GFRNONAA >60 06/09/2014 0431   GFRAA >60 02/24/2020 0401   GFRAA >60 10/25/2014 1048   GFRAA >60 06/09/2014 0431    COAGS: Lab Results  Component Value Date   INR 1.4 (H) 03/27/2023    INR 1.1 06/27/2022   INR 1.1 05/27/2022     Non-Invasive Vascular Imaging:   EXAM:03/27/2023 BILATERAL CAROTID DUPLEX ULTRASOUND   TECHNIQUE: Wallace Cullens scale imaging, color Doppler and duplex ultrasound were performed of bilateral carotid and vertebral arteries in the neck.   COMPARISON:  05/08/2021   FINDINGS: Criteria: Quantification of carotid stenosis is based on velocity parameters that correlate the residual internal carotid diameter with NASCET-based stenosis levels, using the diameter of the distal internal carotid lumen as the denominator for stenosis measurement.   The following velocity measurements were obtained:   RIGHT   ICA:  77/9 cm/sec   CCA:  74/15 cm/sec   SYSTOLIC ICA/CCA RATIO:  1.0   ECA:  60 cm/sec   LEFT   ICA:  57/16 cm/sec   CCA:  47/9 cm/sec   SYSTOLIC ICA/CCA RATIO:  1.2   ECA:  114 cm/sec   RIGHT CAROTID ARTERY: Similar moderate calcified plaque at the level of the distal common carotid artery, right carotid bulb and right ICA origin. Estimated right ICA stenosis remains less than 50%.   RIGHT VERTEBRAL ARTERY: Antegrade flow with normal waveform and velocity.   LEFT CAROTID ARTERY: Similar moderately severe calcified plaque burden at the level of the left carotid bulb and to a lesser degree the proximal left ICA. Estimated left ICA stenosis remains less than 50%.   LEFT VERTEBRAL ARTERY: Antegrade flow with normal waveform and velocity.   IMPRESSION: Similar moderate to moderately severe calcified plaque burden at the level of both carotid bulbs and proximal internal carotid arteries. No significant carotid stenosis identified with estimated bilateral ICA stenoses of less than 50%.  Statin:  Yes.   Beta Blocker:  Yes.   Aspirin:  No. ACEI:  No. ARB:  Yes.   CCB use:  Yes Other antiplatelets/anticoagulants:  Yes.   Eliquis 5 mg twice daily  ASSESSMENT/PLAN: This is a 82 y.o. male who presents to [facility] emergency  department with symptoms of CVA.  Patient presented with slurred speech and right-sided weakness.  Morning his right-sided weakness has resolved.  Patient has had a history of right-sided facial droop so that persisted this morning and patient appears to have what may be his baseline normal speech appearing slightly slurred.  PLAN: Vascular surgery will schedule the patient for an open left carotid endarterectomy next Tuesday, 04/01/2023.  Patient was seen outpatient with Dr. Vilinda Flake on 03/17/2023 and patient had agreed with his caregiver to proceed with surgery.  I discussed the procedure in detail with the patient this morning.  He verbalizes understanding and agrees to proceed with the procedure.  I will also follow-up with the patient's caregiver which I believe is Richard Carter at [phone]) and inform him of the plan.   -I discussed the plan in detail with Dr. Festus Barren MD and he agrees with the plan.   Marcie Bal [facility] 03/28/2023 7:53 AM

## 2023-03-28 NOTE — Evaluation (Signed)
Physical Therapy Evaluation Patient Details Name: Richard Carter MRN: [id number] DOB: [birth year] Today's Date: 03/28/2023  History of Present Illness  Pt is an 82 y.o. male presenting to hospital 03/27/23 with sudden onset of difficulties with speech; R sided weakness also noted but resolved; R sided sensation changes noted as well as facial droop.  Imaging showing acute infarct in the postcentral gyrus on L.  Pt admitted with stroke, stenosis of L carotid artery, a-fib, and htn.  Recent hospitalization 3/24-3/29 for stroke.  PMH includes alcohol abuse, dementia, htn, seizures, stroke (with L sided hemiparesis), a-fib, AKI, pubic ramus and acetabular fx 2020, UTI, chronic L middle finger contracture.  Per notes pt also with h/o R UE weakness.  Clinical Impression  Prior to recent medical concerns, pt was modified independent ambulating with RW; lives alone in 1 level home with ramp to enter; pt's neighbor is pt's caregiver (provides assist 7 days a week for >12 hours per day as needed).  No c/o pain during session.  Difficult to understand pt intermittently d/t impaired speech.  Currently pt is SBA with bed mobility; min assist with tranfers using RW; and min assist to ambulate 16 feet with RW use (limited distance d/t pt fatigue; assist required for balance and for pt to stay closer to RW).  Pt would currently benefit from skilled PT to address noted impairments and functional limitations (see below for any additional details).  Upon hospital discharge, pt would benefit from ongoing therapy.  Educated pt and pt's caregiver that pt would require 24/7 assist for safe home discharge and that pt may benefit from use of manual w/c initially for safety d/t limited mobility, fatigue, and assist levels (pt and pt's caregiver prefer for pt to discharge home from hospital instead of going to rehab facility).   Recommendations for follow up therapy are one component of a multi-disciplinary discharge planning process,  led by the attending physician.  Recommendations may be updated based on patient status, additional functional criteria and insurance authorization.  Follow Up Recommendations Can patient physically be transported by private vehicle: Yes     Assistance Recommended at Discharge Frequent or constant Supervision/Assistance  Patient can return home with the following  A little help with walking and/or transfers;A little help with bathing/dressing/bathroom;Assistance with cooking/housework;Direct supervision/assist for medications management;Assist for transportation;Help with stairs or ramp for entrance    Equipment Recommendations Other (comment) (Pt has RW, BSC, and manual w/c at home already)  Recommendations for Other Services  OT consult    Functional Status Assessment Patient has had a recent decline in their functional status and demonstrates the ability to make significant improvements in function in a reasonable and predictable amount of time.     Precautions / Restrictions Precautions Precautions: Fall Precaution Comments: Seizure precautions Restrictions Weight Bearing Restrictions: No      Mobility  Bed Mobility Overal bed mobility: Needs Assistance Bed Mobility: Supine to Sit, Sit to Supine     Supine to sit: Supervision, HOB elevated Sit to supine: Supervision, HOB elevated   General bed mobility comments: increased effort to perform on own    Transfers Overall transfer level: Needs assistance Equipment used: Rolling walker (2 wheels) Transfers: Sit to/from Stand Sit to Stand: Min assist           General transfer comment: vc's for UE placement; assist to steady    Ambulation/Gait Ambulation/Gait assistance: Min assist Gait Distance (Feet): 16 Feet Assistive device: Rolling walker (2 wheels)   Gait velocity: decreased  General Gait Details: forward flexed posture; pt pushing RW far in front of pt requiring cueing and assist to keep RW closer;  assist to steady; decreased B LE step length/foot clearance  Stairs            Wheelchair Mobility    Modified Rankin (Stroke Patients Only)       Balance Overall balance assessment: Needs assistance Sitting-balance support: No upper extremity supported, Feet supported Sitting balance-Leahy Scale: Good Sitting balance - Comments: steady sitting reaching within BOS   Standing balance support: Bilateral upper extremity supported, During functional activity, Reliant on assistive device for balance Standing balance-Leahy Scale: Poor Standing balance comment: assist for balance during ambulation                             Pertinent Vitals/Pain Pain Assessment Pain Assessment: No/denies pain Vitals (HR and O2 on room air) stable and WFL throughout treatment session.    Home Living Family/patient expects to be discharged to:: Private residence Living Arrangements: Non-relatives/Friends Available Help at Discharge: Available 24 hours/day;Neighbor;Home health (Pt receiving home health SLP, PT, and OT) Type of Home: Mobile home Home Access: Ramped entrance       Home Layout: One level Home Equipment: Grab bars - tub/shower;Shower seat;Grab bars - toilet;Rolling Walker (2 wheels);BSC/3in1;Wheelchair - manual Additional Comments: Pt typically sleeps on sofa.    Prior Function Prior Level of Function : Needs assist             Mobility Comments: Modified independent ambulating with RW.  No falls in last 6 months. ADLs Comments: Network engineer (caregiver) reports he shares POA with pt's son and assists pt with ADLs/IADLs (dressing, bathing, medications, meals, cleaning, etc)--assists pt 7 days a week and assists 12 plus hours a day.     Hand Dominance        Extremity/Trunk Assessment   Upper Extremity Assessment Upper Extremity Assessment: Defer to OT evaluation (h/o R UE weakness)    Lower Extremity Assessment Lower Extremity Assessment: RLE  deficits/detail;LLE deficits/detail (pt denies any tingling/numbness B LE's; intact B LE light touch and tone; pt unable to follow cues to assess proprioception and heel to shin coordination) RLE Deficits / Details: hip flexion 4/5, knee flexion/extension 4/5, DF 4/5 LLE Deficits / Details: hip flexion 4/5, knee flexion/extension 4/5, DF 4/5    Cervical / Trunk Assessment Cervical / Trunk Assessment: Normal  Communication   Communication: Expressive difficulties (dysarthria)  Cognition Arousal/Alertness: Awake/alert Behavior During Therapy: WFL for tasks assessed/performed Overall Cognitive Status: History of cognitive impairments - at baseline (Oriented to person, hospital, and general situation (pt did not know date/time).)                                 General Comments: Pt's neighbor/caregiver reports pt appearing more tired than usual.        General Comments  Nursing cleared pt for participation in physical therapy.  Pt agreeable to PT session.  Pt's caregiver present during session.    Exercises  Gait training   Assessment/Plan    PT Assessment Patient needs continued PT services  PT Problem List Decreased strength;Decreased activity tolerance;Decreased balance;Decreased mobility;Decreased knowledge of use of DME;Decreased safety awareness;Decreased knowledge of precautions       PT Treatment Interventions DME instruction;Gait training;Functional mobility training;Therapeutic activities;Therapeutic exercise;Balance training;Patient/family education;Wheelchair mobility training    PT Goals (Current goals can  be found in the Care Plan section)  Acute Rehab PT Goals Patient Stated Goal: to improve strength and mobility PT Goal Formulation: With patient Time For Goal Achievement: 04/11/23 Potential to Achieve Goals: Good    Frequency Min 4X/week     Co-evaluation               AM-PAC PT "6 Clicks" Mobility  Outcome Measure Help needed turning  from your back to your side while in a flat bed without using bedrails?: None Help needed moving from lying on your back to sitting on the side of a flat bed without using bedrails?: A Little Help needed moving to and from a bed to a chair (including a wheelchair)?: A Little Help needed standing up from a chair using your arms (e.g., wheelchair or bedside chair)?: A Little Help needed to walk in hospital room?: A Little Help needed climbing 3-5 steps with a railing? : A Lot 6 Click Score: 18    End of Session Equipment Utilized During Treatment: Gait belt Activity Tolerance: Patient limited by fatigue Patient left: in bed;with call bell/phone within reach;with bed alarm set Nurse Communication: Mobility status;Precautions PT Visit Diagnosis: Unsteadiness on feet (R26.81);Other abnormalities of gait and mobility (R26.89);Muscle weakness (generalized) (M62.81)    Time: 9562-1308 PT Time Calculation (min) (ACUTE ONLY): 20 min   Charges:   PT Evaluation $PT Eval Low Complexity: 1 Low PT Treatments $Gait Training: 8-22 mins        Hendricks Limes, PT 03/28/23, 3:55 PM

## 2023-03-28 NOTE — Progress Notes (Signed)
Subjective: Subjectively improved per caretaker at the bedside.   Objective: Current vital signs: BP 137/75 (BP Location: Right Arm)   Pulse (!) 57   Temp 97.8 F (36.6 C)   Resp 20   SpO2 96%  Vital signs in last 24 hours: Temp:  [97.4 F (36.3 C)-97.8 F (36.6 C)] 97.8 F (36.6 C) (05/03 0719) Pulse Rate:  [47-120] 57 (05/03 0521) Resp:  [5-21] 20 (05/03 0719) BP: (103-153)/(50-97) 137/75 (05/03 0719) SpO2:  [89 %-100 %] 96 % (05/03 0719)  Intake/Output from previous day: 05/02 0701 - 05/03 0700 In: -  Out: 350 [Urine:350] Intake/Output this shift: No intake/output data recorded. Nutritional status:  Diet Order             Diet Heart Room service appropriate? Yes; Fluid consistency: Thin  Diet effective now                   Physical Exam HEENT- [place]/AT   Lungs- Clearing throat following a coughing fit after eating oatmeal just prior to neurology evaluation.  Extremities- Warm and well-perfused. No edema.    Neurological Examination Mental Status: Awake and alert. Severely dysarthric but understandable speech if listening carefully; slightly improved since yesterday. No dysfluency noted in this context. Naming and repetition are intact. Comprehension is intact. Orientation unchanged since yesterday; he is oriented to self, the city and the state, but is not oriented to the correct month, year or day of the week.  Cranial Nerves: II, III,IV, VI: Fixates normally and tracks with full EOM. Saccadic visual pursuits are noted.  VII: Prominent right facial droop is less severe than yesterday VIII: Hearing intact to voice IX,X: Hypophonic speech XI: Head is midline XII: Midline tongue extension Motor: RUE: 5/5 LUE: 5/5 RLE: 5/5 LLE: 5/5 Mild left sided weakness noted yesterday is now improved.  Sensory: FT sensation subjectively intact bilaterally.  Deep Tendon Reflexes: Unchanged Cerebellar: No ataxia noted Gait: Deferred Other: No jerking, twitching or other  seizure-like activity noted.   Lab Results: Results for orders placed or performed during the hospital encounter of 03/27/23 (from the past 48 hour(s))  CBG monitoring, ED     Status: Abnormal   Collection Time: 03/27/23  1:42 PM  Result Value Ref Range   Glucose-Capillary 103 (H) 70 - 99 mg/dL    Comment: Glucose reference range applies only to samples taken after fasting for at least 8 hours.  Protime-INR     Status: Abnormal   Collection Time: 03/27/23  2:09 PM  Result Value Ref Range   Prothrombin Time 17.1 (H) 11.4 - 15.2 seconds   INR 1.4 (H) 0.8 - 1.2    Comment: (NOTE) INR goal varies based on device and disease states. Performed at [facility], [address] [place], [place] [address]   APTT     Status: Abnormal   Collection Time: 03/27/23  2:09 PM  Result Value Ref Range   aPTT 38 (H) 24 - 36 seconds    Comment:        IF BASELINE aPTT IS ELEVATED, SUGGEST PATIENT RISK ASSESSMENT BE USED TO DETERMINE APPROPRIATE ANTICOAGULANT THERAPY. Performed at [facility], [address] [place], [place] [address]   CBC     Status: Abnormal   Collection Time: 03/27/23  2:09 PM  Result Value Ref Range   WBC 8.9 4.0 - 10.5 K/uL   RBC 3.31 (L) 4.22 - 5.81 MIL/uL   Hemoglobin 8.3 (L) 13.0 - 17.0 g/dL  HCT 28.0 (L) 39.0 - 52.0 %   MCV 84.6 80.0 - 100.0 fL   MCH 25.1 (L) 26.0 - 34.0 pg   MCHC 29.6 (L) 30.0 - 36.0 g/dL   RDW 29.5 (H) 62.1 - 30.8 %   Platelets 297 150 - 400 K/uL   nRBC 0.0 0.0 - 0.2 %    Comment: Performed at [facility], [address] [place], [place] [address]  Differential     Status: None   Collection Time: 03/27/23  2:09 PM  Result Value Ref Range   Neutrophils Relative % 69 %   Neutro Abs 6.2 1.7 - 7.7 K/uL   Lymphocytes Relative 16 %   Lymphs Abs 1.4 0.7 - 4.0 K/uL   Monocytes Relative 9 %   Monocytes Absolute 0.8 0.1 - 1.0 K/uL   Eosinophils Relative 4 %   Eosinophils Absolute 0.4 0.0 - 0.5 K/uL   Basophils  Relative 1 %   Basophils Absolute 0.1 0.0 - 0.1 K/uL   Immature Granulocytes 1 %   Abs Immature Granulocytes 0.04 0.00 - 0.07 K/uL    Comment: Performed at [facility], [address] [place], [place] [address]  Comprehensive metabolic panel     Status: Abnormal   Collection Time: 03/27/23  2:09 PM  Result Value Ref Range   Sodium 138 135 - 145 mmol/L   Potassium 3.7 3.5 - 5.1 mmol/L   Chloride 112 (H) 98 - 111 mmol/L   CO2 18 (L) 22 - 32 mmol/L   Glucose, Bld 104 (H) 70 - 99 mg/dL    Comment: Glucose reference range applies only to samples taken after fasting for at least 8 hours.   BUN 18 8 - 23 mg/dL   Creatinine, Ser 5.28 0.61 - 1.24 mg/dL   Calcium 9.3 8.9 - 41.3 mg/dL   Total Protein 6.9 6.5 - 8.1 g/dL   Albumin 3.7 3.5 - 5.0 g/dL   AST 24 15 - 41 U/L   ALT 20 0 - 44 U/L   Alkaline Phosphatase 58 38 - 126 U/L   Total Bilirubin 0.3 0.3 - 1.2 mg/dL   GFR, Estimated >24 >40 mL/min    Comment: (NOTE) Calculated using the CKD-EPI Creatinine Equation (2021)    Anion gap 8 5 - 15    Comment: Performed at [facility], [address] [place], [place] [address]  Ethanol     Status: None   Collection Time: 03/27/23  2:09 PM  Result Value Ref Range   Alcohol, Ethyl (B) <10 <10 mg/dL    Comment: (NOTE) Lowest detectable limit for serum alcohol is 10 mg/dL.  For medical purposes only. Performed at [facility], [address] [place], [place] [address]   Hemoglobin A1c     Status: Abnormal   Collection Time: 03/27/23  2:09 PM  Result Value Ref Range   Hgb A1c MFr Bld 5.8 (H) 4.8 - 5.6 %    Comment: (NOTE) Pre diabetes:          5.7%-6.4%  Diabetes:              >6.4%  Glycemic control for   <7.0% adults with diabetes    Mean Plasma Glucose 119.76 mg/dL    Comment: Performed at [facility], [address], [place], [place] [address]  APTT     Status: Abnormal   Collection Time: 03/27/23  7:20 PM  Result Value Ref Range   aPTT  39 (H)  24 - 36 seconds    Comment:        IF BASELINE aPTT IS ELEVATED, SUGGEST PATIENT RISK ASSESSMENT BE USED TO DETERMINE APPROPRIATE ANTICOAGULANT THERAPY. Performed at [facility], [address] [place], [place] [address]   Lipid panel     Status: Abnormal   Collection Time: 03/28/23  5:47 AM  Result Value Ref Range   Cholesterol 120 0 - 200 mg/dL   Triglycerides 74 <604 mg/dL   HDL 40 (L) >54 mg/dL   Total CHOL/HDL Ratio 3.0 RATIO   VLDL 15 0 - 40 mg/dL   LDL Cholesterol 65 0 - 99 mg/dL    Comment:        Total Cholesterol/HDL:CHD Risk Coronary Heart Disease Risk Table                     Men   Women  1/2 Average Risk   3.4   3.3  Average Risk       5.0   4.4  2 X Average Risk   9.6   7.1  3 X Average Risk  23.4   11.0        Use the calculated Patient Ratio above and the CHD Risk Table to determine the patient's CHD Risk.        ATP III CLASSIFICATION (LDL):  <100     mg/dL   Optimal  098-119  mg/dL   Near or Above                    Optimal  130-159  mg/dL   Borderline  147-829  mg/dL   High  >562     mg/dL   Very High Performed at [facility], [address] [place], [place] [address]   CBC     Status: Abnormal   Collection Time: 03/28/23  5:47 AM  Result Value Ref Range   WBC 7.1 4.0 - 10.5 K/uL   RBC 3.09 (L) 4.22 - 5.81 MIL/uL   Hemoglobin 7.9 (L) 13.0 - 17.0 g/dL   HCT 57.8 (L) 46.9 - 62.9 %   MCV 81.6 80.0 - 100.0 fL   MCH 25.6 (L) 26.0 - 34.0 pg   MCHC 31.3 30.0 - 36.0 g/dL   RDW 52.8 (H) 41.3 - 24.4 %   Platelets 275 150 - 400 K/uL   nRBC 0.0 0.0 - 0.2 %    Comment: Performed at [facility], [address] [place], [place] [address]  Basic metabolic panel     Status: Abnormal   Collection Time: 03/28/23  5:47 AM  Result Value Ref Range   Sodium 139 135 - 145 mmol/L   Potassium 3.3 (L) 3.5 - 5.1 mmol/L   Chloride 111 98 - 111 mmol/L   CO2 21 (L) 22 - 32 mmol/L   Glucose, Bld 89 70 - 99 mg/dL    Comment:  Glucose reference range applies only to samples taken after fasting for at least 8 hours.   BUN 18 8 - 23 mg/dL   Creatinine, Ser 2.53 0.61 - 1.24 mg/dL   Calcium 9.2 8.9 - 66.4 mg/dL   GFR, Estimated >40 >34 mL/min    Comment: (NOTE) Calculated using the CKD-EPI Creatinine Equation (2021)    Anion gap 7 5 - 15    Comment: Performed at [facility], [address] [place], [place] [address]    No results found for this or any previous visit (from  the past 240 hour(s)).  Lipid Panel Recent Labs    03/28/23 0547  CHOL 120  TRIG 74  HDL 40*  CHOLHDL 3.0  VLDL 15  LDLCALC 65    Studies/Results: US Carotid Bilateral (at [facility] and [facility] only)  Result Date: 03/27/2023 CLINICAL DATA:  Acute cerebral infarction. EXAM: BILATERAL CAROTID DUPLEX ULTRASOUND TECHNIQUE: Wallace Cullens scale imaging, color Doppler and duplex ultrasound were performed of bilateral carotid and vertebral arteries in the neck. COMPARISON:  05/08/2021 FINDINGS: Criteria: Quantification of carotid stenosis is based on velocity parameters that correlate the residual internal carotid diameter with NASCET-based stenosis levels, using the diameter of the distal internal carotid lumen as the denominator for stenosis measurement. The following velocity measurements were obtained: RIGHT ICA:  77/9 cm/sec CCA:  74/15 cm/sec SYSTOLIC ICA/CCA RATIO:  1.0 ECA:  60 cm/sec LEFT ICA:  57/16 cm/sec CCA:  47/9 cm/sec SYSTOLIC ICA/CCA RATIO:  1.2 ECA:  114 cm/sec RIGHT CAROTID ARTERY: Similar moderate calcified plaque at the level of the distal common carotid artery, right carotid bulb and right ICA origin. Estimated right ICA stenosis remains less than 50%. RIGHT VERTEBRAL ARTERY: Antegrade flow with normal waveform and velocity. LEFT CAROTID ARTERY: Similar moderately severe calcified plaque burden at the level of the left carotid bulb and to a lesser degree the proximal left ICA. Estimated left ICA stenosis remains less than 50%. LEFT  VERTEBRAL ARTERY: Antegrade flow with normal waveform and velocity. IMPRESSION: Similar moderate to moderately severe calcified plaque burden at the level of both carotid bulbs and proximal internal carotid arteries. No significant carotid stenosis identified with estimated bilateral ICA stenoses of less than 50%. Electronically Signed   By: Irish Lack M.D.   On: 03/27/2023 17:33   MR BRAIN WO CONTRAST  Result Date: 03/27/2023 CLINICAL DATA:  Stroke suspected. EXAM: MRI HEAD WITHOUT CONTRAST TECHNIQUE: Multiplanar, multiecho pulse sequences of the brain and surrounding structures were obtained without intravenous contrast. COMPARISON:  MR Head 02/16/23 FINDINGS: Assessment is markedly limited due to the degree of motion artifact. Brain: There is likely an acute infarct in the postcentral gyrus the left (series 5 image 80). Sequela of severe chronic microvascular ischemic change with advanced generalized volume loss. Susceptibility artifact in the basal ganglia on the left present sequela of a chronic left basal ganglia hemorrhage. Chronic pontine infarct. Chronic infarct in the posterior left frontal lobe and likely the right occipital lobe. Vascular: Normal flow voids. Skull and upper cervical spine: Normal marrow signal. Sinuses/Orbits: No middle ear or mastoid effusion. Mucosal thickening bilateral ethmoid sinuses. Orbits are unremarkable. Other: None. IMPRESSION: Assessment is markedly limited due to the degree of motion artifact. Within this limitation- 1. Acute infarct in the postcentral gyrus on the left 2. 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 Electronically Signed   By: Lorenza Cambridge M.D.   On: 03/27/2023 17:14   CT HEAD CODE STROKE WO CONTRAST  Result Date: 03/27/2023 CLINICAL DATA:  Code stroke.  Difficulty with speech. EXAM: CT HEAD WITHOUT CONTRAST TECHNIQUE: Contiguous axial images were  obtained from the base of the skull through the vertex without intravenous contrast. RADIATION DOSE REDUCTION: This exam was performed according to the departmental dose-optimization program which includes automated exposure control, adjustment of the mA and/or kV according to patient size and/or use of iterative reconstruction technique. COMPARISON:  Head CT 02/18/2023. FINDINGS: Brain: No acute intracranial hemorrhage. Unchanged encephalomalacia in the left posterior frontal lobe, involving  the facial motor and pre motor regions. Old infarct in the right parietal lobe. Unchanged moderate chronic small-vessel disease and generalized volume loss. No hydrocephalus or extra-axial collection. No mass effect or midline shift. Vascular: No hyperdense vessel or unexpected calcification. Skull: No calvarial fracture or suspicious bone lesion. Skull base is unremarkable. Sinuses/Orbits: Unremarkable. Other: None. ASPECTS [facility] Stroke Program Early CT Score) - Ganglionic level infarction (caudate, lentiform nuclei, internal capsule, insula, M1-M3 cortex): 7 - Supraganglionic infarction (M4-M6 cortex): 2 Total score (0-10 with 10 being normal): 9 IMPRESSION: 1. No acute intracranial hemorrhage or evidence of evolving large vessel territory infarct. ASPECT score is 9. 2. Unchanged encephalomalacia in the left posterior frontal lobe, involving the right facial motor and pre motor regions. Code stroke imaging results were communicated on 03/27/2023 at 2:04 pm to provider Dr. Otelia Limes via secure text paging. Electronically Signed   By: Orvan Falconer M.D.   On: 03/27/2023 14:06    Medications: Scheduled:   stroke: early stages of recovery book   Does not apply Once   apixaban  5 mg Oral BID   atorvastatin  20 mg Oral QPM   levETIRAcetam  500 mg Oral BID   pantoprazole  40 mg Oral Daily   potassium chloride  40 mEq Oral Once   traZODone  100 mg Oral QHS   TTE 04/22/22: 1. Left ventricular ejection fraction, by  estimation, is 55 to 60%. The  left ventricle has normal function. The left ventricle has no regional  wall motion abnormalities. Left ventricular diastolic parameters are  indeterminate.   2. Right ventricular systolic function is normal. The right ventricular  size is normal.   3. The mitral valve is normal in structure. Mild to moderate mitral valve  regurgitation. No evidence of mitral stenosis.   4. The aortic valve is normal in structure. Aortic valve regurgitation is  not visualized. No aortic stenosis is present.   5. The inferior vena cava is normal in size with greater than 50%  respiratory variability, suggesting right atrial pressure of 3 mmHg.    CTA from March 2024: 1. The small acute infarcts seen on the recent brain MRI are not well seen on the current study. No new acute intracranial pathology. 2. Stable CTA head/neck since 06/27/2022 with no new emergent finding. 3. Unchanged bulky plaque in the distal left common carotid artery resulting in high-grade stenosis. Plaque extends into the proximal internal carotid artery resulting in approximately 50% stenosis. The distal internal carotid artery is patent. Calcified plaque at the right carotid bifurcation without hemodynamically significant stenosis, and moderate stenosis of the origin of the right vertebral artery. 4. Unchanged calcified plaque in the carotid siphons without high-grade stenosis, and diminutive right V4 segment.     Assessment: 82 year old male with history of left frontal lobe stroke presenting with acute onset of right sided weakness and dysarthria. Right sided weakness resolved at time of Neurology assessment, but there is subtle left sided weakness as well as severe dysarthria.  - Exam reveals subtle left arm and leg weakness, prominent right facial droop and severe dysarthria. No visual field cut, hemineglect, expressive or receptive aphasia noted.  - Caretaker provides new history today. He states  that the patient's last seizure was 2-3 years ago in the context of EtOH withdrawal. Since then the patient has been in a supervised living environment and "he has not touched a drop".  - Imaging: - CT head: No acute intracranial hemorrhage or evidence of evolving large  vessel territory infarct. ASPECT score is 9. Unchanged encephalomalacia in the left posterior frontal lobe, corresponding to the motor and pre motor regions controlling right facial movement. - MRI brain (images personally reviewed): Assessment is markedly limited due to the degree of motion artifact. Punctate cortically based acute infarct in the postcentral gyrus on the left. Moderate to severe diffuse cerebral atrophy. Sequela of severe chronic microvascular ischemic change. Chronic left basal ganglia hemorrhage. Chronic medium sized left MCA stroke versus focally confluent severe chronic small vessel disease. Chronic pontine infarct. Chronic infarct in the posterior left frontal lobe and likely the right occipital lobe. - The patient was unable to tolerate MRI long enough to obtain an MRA of his head.  - Carotid ultrasound: Similar moderate to moderately severe calcified plaque burden at the level of both carotid bulbs and proximal internal carotid arteries. No significant carotid stenosis identified with estimated bilateral ICA stenoses of less than 50%. - Diagnosis: MRI reveals a new stroke in the left postcentral gyrus. He has had multiple recurrent strokes despite maximal medical therapy. May benefit from CEA this admission given findings on carotid ultrasound.     Recommendations: - Vascular Surgery has been consulted.  - Has had a TTE within the last 12 months - PT/OT/Speech - Cardiac telemetry - Continue Eliquis for his PAF - Continue Keppra for his seizure disorder - Frequent neuro checks - NPO until passes stroke swallow screen - Modified permissive HTN protocol: Treat with antihypertensives only if SBP > 180. After  1230 he will be out of the 24 hour permissive HTN time window and then BP should be gradually brought to a goal of 120-140/80. - Continue his home atorvastatin   Addendum: - Has been scheduled for left CEA on 5/7 - Eliquis switched to IV heparin in preparation for the operation. Postoperatively, heparin should be switched back to Eliquis for secondary stroke prevention in the setting of his PAF - Neurohospitalist service will follow PRN. Please call if there are additional questions.     LOS: 1 day   @Electronically  signed: Dr. Caryl Pina 03/28/2023  8:38 AM

## 2023-03-29 DIAGNOSIS — I6359 Cerebral infarction due to unspecified occlusion or stenosis of other cerebral artery: Secondary | ICD-10-CM

## 2023-03-29 LAB — CBC
HCT: 26.8 % — ABNORMAL LOW (ref 39.0–52.0)
Hemoglobin: 8.2 g/dL — ABNORMAL LOW (ref 13.0–17.0)
MCH: 25.4 pg — ABNORMAL LOW (ref 26.0–34.0)
MCHC: 30.6 g/dL (ref 30.0–36.0)
MCV: 83 fL (ref 80.0–100.0)
Platelets: 301 10*3/uL (ref 150–400)
RBC: 3.23 MIL/uL — ABNORMAL LOW (ref 4.22–5.81)
RDW: 16.4 % — ABNORMAL HIGH (ref 11.5–15.5)
WBC: 7.5 10*3/uL (ref 4.0–10.5)
nRBC: 0 % (ref 0.0–0.2)

## 2023-03-29 LAB — COMPREHENSIVE METABOLIC PANEL
ALT: 17 U/L (ref 0–44)
AST: 21 U/L (ref 15–41)
Albumin: 3.9 g/dL (ref 3.5–5.0)
Alkaline Phosphatase: 56 U/L (ref 38–126)
Anion gap: 6 (ref 5–15)
BUN: 20 mg/dL (ref 8–23)
CO2: 19 mmol/L — ABNORMAL LOW (ref 22–32)
Calcium: 9.8 mg/dL (ref 8.9–10.3)
Chloride: 114 mmol/L — ABNORMAL HIGH (ref 98–111)
Creatinine, Ser: 1.05 mg/dL (ref 0.61–1.24)
GFR, Estimated: >60 mL/min (ref >60)
Glucose, Bld: 82 mg/dL (ref 70–99)
Potassium: 3.9 mmol/L (ref 3.5–5.1)
Sodium: 139 mmol/L (ref 135–145)
Total Bilirubin: 0.6 mg/dL (ref 0.3–1.2)
Total Protein: 7.1 g/dL (ref 6.5–8.1)

## 2023-03-29 LAB — APTT
aPTT: 136 seconds — ABNORMAL HIGH (ref 24–36)
aPTT: 136 seconds — ABNORMAL HIGH (ref 24–36)
aPTT: 65 seconds — ABNORMAL HIGH (ref 24–36)

## 2023-03-29 LAB — MAGNESIUM: Magnesium: 1.7 mg/dL (ref 1.7–2.4)

## 2023-03-29 LAB — HEPARIN LEVEL (UNFRACTIONATED): Heparin Unfractionated: >1.1 IU/mL — ABNORMAL HIGH (ref 0.30–0.70)

## 2023-03-29 MED ORDER — LACTATED RINGERS IV SOLN: INTRAVENOUS | Status: DC

## 2023-03-29 MED ORDER — HEPARIN (PORCINE) 25000 UT/250ML-% IV SOLN
550.0000 [IU]/h | INTRAVENOUS | Status: DC
  Administered 2023-03-29: 500 [IU]/h via INTRAVENOUS
  Administered 2023-03-30: 550 [IU]/h via INTRAVENOUS
  Filled 2023-03-29: qty 250

## 2023-03-29 NOTE — Progress Notes (Signed)
PROGRESS NOTE  Richard Carter WUJ:[id number] DOB: 1941/03/04 DOA: 03/27/2023 PCP: Marisue Ivan, MD  Brief History   Richard Carter is a 82 y.o. 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  Found to have recurrent stroke.  Neurology and vascular surgery involved.  Plan for left CEA on 5/7.  Currently on IV heparin.  SLP recommended n.p.o. status due to high risk for aspiration. I have discussed this with the patient's POA Renaldo Reel. He does not feel that the patient would want to have a feeding tube. He states that the patient's son will be coming in from [place] tonight. He will discuss it with him. SLP to re-evaluate the patient today. MBS has been ordered.  A & P  Assessment and Plan: Acute ischemic stroke MRI-brain showed acute infarct in the postcentral gyrus on the left. Neurology following. Carotid Dopplers unchanged from prior stenosis. Currently allowing permissive hypertension. Patient was on Eliquis.  Due to n.p.o. status will be switched to heparin. Cholesterol medications currently on hold due to n.p.o. status. Dysphagia - SLP was consulted.  Initially patient was cleared for oral diet based on his bedside swallowing evaluation but due to ongoing concerns with cough while eating SLP was consulted for bedside swallowing evaluation and currently recommending n.p.o. status. PT OT were also consulted. Monitor on telemetry. Recently had an echocardiogram with preserved EF without any wall motion abnormality. Palliative care has been consulted.   Dysphagia The patient was evaluated by SLP yesterday. The patient was deemed to be at high risk for aspiration. For that reason he was made NPO on 03/28/2023. SLP will re-evaluate him today as he was somewhat lethargic  yesterday. I have discussed the patient's high risk of aspiration with his POA, Renaldo Reel. Richard Carter feels that the patient would likely not want a feeding tube, but he will discuss it with the patient's son who will be coming in from [place] tonight. The patient will remain NPO today. IV LR started. MBS ordered.   Stenosis of left carotid artery:  pt has high-grade left carotid arteries stenosis, likely need CEA -consulted Dr. Wyn Quaker of [facility] Scheduled for outpatient procedure.  Currently scheduled for left CEA on 5/7. Holding Eliquis and switching to IV heparin.   paroxysmal atrial fibrillation Heart rate 58, 120 Currently rate controlled. Continuing metoprolol. Eliquis on hold and switch to heparin.   Seizures (HCC) -Seizure precaution -As needed Ativan for seizure -Continue home Keppra, now IV.   HTN (hypertension): Allowing permissive hypertension. -hold amlodipine, Cozaar -Continue metoprolol which is for rate control for atrial fibrillation   HLD: - Continue his home atorvastatin   Dementia (HCC): No behavior disturbance -Fall precaution  I have seen and examined this patient myself. I have spent 35 minutes in his evaluation and care.  DVT prophylaxis: Heparin drip Code Status: DNR Family Communication: I have discussed the patient with Renaldo Reel, POA for medical decision making. Disposition Plan: Home with home health vs SNF. TOC to follow up with [facility] Monday.    Tiburcio Linder, DO [facility] Direct contact: see [email or web address]  7PM-7AM contact night coverage as above 03/29/2023, 11:40 AM  LOS: 2 days   Consultants  Vascular Surgery Neurology  Procedures  None  Antibiotics   Anti-infectives (From admission, onward)    None      Interval History/Subjective  The  patient is resting comfortably today. No new complaints.  Objective   Vitals:  Vitals:   03/29/23 0500 03/29/23 0730  BP: (!) 146/70 132/63  Pulse: 61 63  Resp:  16  Temp:  (!) 97.5  F (36.4 C)  SpO2:  100%    Exam:  Constitutional:  Appears calm and comfortable. Speech is thick and unintellligible Eyes:  pupils and irises appear normal Normal lids and conjunctivae Respiratory:  CTA bilaterally, no w/r/r.  Respiratory effort normal. No retractions or accessory muscle use Cardiovascular:  RRR, no m/r/g No LE extremity edema   Normal pedal pulses Abdomen:  Abdomen appears normal; no tenderness or masses No hernias No HSM Musculoskeletal:  Digits/nails BUE: no clubbing, cyanosis, petechiae, infection exam of joints, bones, muscles of at least one of following: head/neck, RUE, LUE, RLE, LLE   strength and tone normal, no atrophy, no abnormal movements No tenderness, masses Normal ROM, no contractures  gait and station Skin:  No rashes, lesions, ulcers palpation of skin: no induration or nodules Neurologic:  CN 2-12 intact Sensation all 4 extremities intact Psychiatric:  Pt is unable to cooperate with exam.   I have personally reviewed the following:   Today's Data   Vitals:   03/29/23 0730 03/29/23 1209  BP: 132/63 (!) 161/69  Pulse: 63 (!) 51  Resp: 16 16  Temp: (!) 97.5 F (36.4 C) (!) 97.3 F (36.3 C)  SpO2: 100% 92%     Lab Data  CBC    Component Value Date/Time   WBC 7.5 03/29/2023 0428   RBC 3.23 (L) 03/29/2023 0428   HGB 8.2 (L) 03/29/2023 0428   HGB 12.9 (L) 10/25/2014 1048   HCT 26.8 (L) 03/29/2023 0428   HCT 38.6 (L) 10/25/2014 1048   PLT 301 03/29/2023 0428   PLT 93 (L) 10/25/2014 1048   MCV 83.0 03/29/2023 0428   MCV 108 (H) 10/25/2014 1048   MCH 25.4 (L) 03/29/2023 0428   MCHC 30.6 03/29/2023 0428   RDW 16.4 (H) 03/29/2023 0428   RDW 19.6 (H) 10/25/2014 1048   LYMPHSABS 1.4 03/27/2023 1409   LYMPHSABS 0.8 (L) 10/25/2014 1048   MONOABS 0.8 03/27/2023 1409   MONOABS 0.9 10/25/2014 1048   EOSABS 0.4 03/27/2023 1409   EOSABS 0.0 10/25/2014 1048   BASOSABS 0.1 03/27/2023 1409   BASOSABS 0.0 10/25/2014 1048       Latest Ref Rng & Units 03/29/2023    4:28 AM 03/28/2023    5:47 AM 03/27/2023    2:09 PM  BMP  Glucose 70 - 99 mg/dL 82  89  213   BUN 8 - 23 mg/dL 20  18  18    Creatinine 0.61 - 1.24 mg/dL 0.86  5.78  4.69   Sodium 135 - 145 mmol/L 139  139  138   Potassium 3.5 - 5.1 mmol/L 3.9  3.3  3.7   Chloride 98 - 111 mmol/L 114  111  112   CO2 22 - 32 mmol/L 19  21  18    Calcium 8.9 - 10.3 mg/dL 9.8  9.2  9.3      Micro Data   Results for orders placed or performed during the hospital encounter of 02/16/23  Culture, blood (Routine X 2) w Reflex to ID Panel     Status: None   Collection Time: 02/16/23  5:48 PM   Specimen: BLOOD  Result Value Ref Range Status   Specimen Description BLOOD BLOOD RIGHT ARM  Final   Special Requests  Final    BOTTLES DRAWN AEROBIC ONLY Blood Culture results may not be optimal due to an inadequate volume of blood received in culture bottles   Culture   Final    NO GROWTH 5 DAYS Performed at [facility], [address], [place], [place] [address]    Report Status 02/21/2023 FINAL  Final  Urine Culture     Status: None   Collection Time: 02/16/23  8:41 PM   Specimen: Urine, Random  Result Value Ref Range Status   Specimen Description   Final    URINE, RANDOM Performed at [facility], [address] [place], [place] [address]    Special Requests   Final    NONE Reflexed from [id number] Performed at [facility], [address], [place], [place] [address]    Culture   Final    NO GROWTH Performed at [facility], [address] [place], [place] [address]    Report Status 02/17/2023 FINAL  Final  Culture, blood (Routine X 2) w Reflex to ID Panel     Status: None   Collection Time: 02/16/23  9:17 PM   Specimen: BLOOD  Result Value Ref Range Status   Specimen Description BLOOD BLOOD LEFT HAND  Final   Special Requests   Final    BOTTLES DRAWN AEROBIC AND ANAEROBIC Blood Culture adequate volume   Culture   Final     NO GROWTH 5 DAYS Performed at [facility], [address], [place], [place] [address]    Report Status 02/21/2023 FINAL  Final     Imaging  MRI BRain: Acute infarct in the post central gyrus on the left.  Sequelae of severe chronic microvascular ischemic change with advanced generalized volume an dlikely a chronic left basal ganglia hemorrhage.   Scheduled Meds:  pantoprazole (PROTONIX) IV  40 mg Intravenous Q24H   Continuous Infusions:  heparin     lactated ringers 75 mL/hr at 03/29/23 1118   levETIRAcetam 500 mg (03/29/23 0942)    Principal Problem:   Stroke [facility]) Active Problems:   Stenosis of left carotid artery   AF (paroxysmal atrial fibrillation) (HCC)   Seizures (HCC)   HTN (hypertension)   Dementia (HCC)   HLD (hyperlipidemia)   LOS: 2 days

## 2023-03-29 NOTE — Assessment & Plan Note (Signed)
The patient was evaluated by SLP yesterday. The patient was deemed to be at high risk for aspiration. For that reason he was made NPO on 03/28/2023. SLP will re-evaluate him today as he was somewhat lethargic yesterday. I have discussed the patient's high risk of aspiration with his POA, Renaldo Reel. [name] feels that the patient would likely not want a feeding tube, but he will discuss it with the patient's son who will be coming in from [place] tonight. The patient will remain NPO today. IV LR started. MBS ordered.

## 2023-03-29 NOTE — TOC Initial Note (Addendum)
Transition of Care [facility]) - Initial/Assessment Note    Patient Details  Name: Richard Carter MRN: [id number] Date of Birth: 04/06/1941  Transition of Care [facility]) CM/SW Contact:    Liliana Cline, LCSW Phone Number: 03/29/2023, 10:54 AM  Clinical Narrative:                 CSW spoke with patient's POA Renaldo Reel via phone regarding PT rec for SNF. Patient lives alone but [name] states he stays with him almost 24/7 to provide care. Patient has a walker, BSC, and wheelchair at home. Patient went to [facility] in the past for short term rehab. [name] reported patient is active with [facility] for Home Health services. [name] states he would like to talk with patient's son Roe Coombs regarding the SNF rec, but thinks they will lean towards home with [facility]. [name] stated Roe Coombs is out of the country until tomorrow. [name] requested TOC follow up with him Monday. Explained that we would need to follow up sooner if patient is medically ready, he verbalized understanding. TOC will continue to follow.  CSW also reached out to [place] with [facility] to confirm if they are active with patient for [facility] and which services. [name] stated she will check and call CSW back.  2:55- [name] with [facility] confirmed that patient is active with them for Nursing, PT, and OT.   Expected Discharge Plan: Home w Home Health Services Barriers to Discharge: Continued Medical Work up   Patient Goals and CMS Choice Patient states their goals for this hospitalization and ongoing recovery are:: POA prefers HH but wants to talk to family to decide CMS Medicare.gov Compare Post Acute Care list provided to:: Patient Represenative (must comment) Choice offered to / list presented to : [facility] POA / Guardian      Expected Discharge Plan and Services       Living arrangements for the past 2 months: Single Family Home                             HH Agency: [facility] Date [facility] Agency Contacted: 03/29/23    Representative spoke with at [facility] Agency: [name]  Prior Living Arrangements/Services Living arrangements for the past 2 months: Single Family Home Lives with:: Self Patient language and need for interpreter reviewed:: Yes Do you feel safe going back to the place where you live?: Yes      Need for Family Participation in Patient Care: Yes (Comment) Care giver support system in place?: Yes (comment) Current home services: DME, Home PT, Sitter Criminal Activity/Legal Involvement Pertinent to Current Situation/Hospitalization: No - Comment as needed  Activities of Daily Living      Permission Sought/Granted Permission sought to share information with : Facility Industrial/product designer granted to share information with : Yes, Designer, fashion/clothing (by [employer])     Permission granted to share info w AGENCY: [facility]        Emotional Assessment         Alcohol / Substance Use: Not Applicable Psych Involvement: No (comment)  Admission diagnosis:  Stroke [facility]) [I63.9] Speech disturbance, unspecified type [R47.9] Patient Active Problem List   Diagnosis Date Noted   Stroke (HCC) 03/27/2023   Stenosis of left carotid artery 02/20/2023   Altered mental status 02/20/2023   Acute stroke due to ischemia (HCC) 02/17/2023   Stroke-like symptom 02/16/2023   Leukocytosis 02/16/2023  Protein-calorie malnutrition, severe 05/28/2022   Dysphagia 05/28/2022   Acute metabolic encephalopathy 05/27/2022   History of stroke 05/27/2022   Aspiration pneumonia (HCC) 04/23/2022   Nicotine dependence    Elevated troponin    Acute CVA (cerebrovascular accident) (HCC) 05/07/2021   Non compliance w medication regimen 05/07/2021   Dementia (HCC) 05/07/2021   AF (paroxysmal atrial fibrillation) (HCC) 05/07/2021   HTN (hypertension) 11/29/2020   Alcohol abuse 11/29/2020   AKI (acute kidney injury) (HCC) 11/29/2020   Atrial flutter with rapid ventricular response (HCC) 11/29/2020    Seizures (HCC) 02/22/2020   Pubic ramus fracture (HCC) 07/29/2019   Acetabular fracture (HCC) 07/29/2019   HLD (hyperlipidemia) 07/29/2019   GERD (gastroesophageal reflux disease) 07/29/2019   UTI (urinary tract infection) 07/29/2019   PCP:  Marisue Ivan, MD Pharmacy:   [facility] [address] ([address]), [place] - [address] [place] ([place]) [place] [address] Phone: [phone] Fax: [phone]     Social Determinants of Health (SDOH) Social History: SDOH Screenings   Food Insecurity: No Food Insecurity (02/16/2023)  Housing: Low Risk  (02/16/2023)  Transportation Needs: No Transportation Needs (02/16/2023)  Utilities: Not At Risk (02/16/2023)  Tobacco Use: Medium Risk (03/19/2023)   SDOH Interventions:     Readmission Risk Interventions    04/21/2022    1:55 PM  Readmission Risk Prevention Plan  Post Dischage Appt Complete  Medication Screening Complete  Transportation Screening Complete

## 2023-03-29 NOTE — Consult Note (Signed)
ANTICOAGULATION CONSULT NOTE  Pharmacy Consult for heparin Indication: atrial fibrillation ISO recent stroke  No Known Allergies  Patient Measurements: Height: 5' 7.01" (170.2 cm) Weight: 62.1 kg (136 lb 14.5 oz) IBW/kg (Calculated) : 66.12 Heparin Dosing Weight: 62.1  Vital Signs: Temp: 97.5 F (36.4 C) (05/04 0730) Temp Source: Oral (05/04 0338) BP: 132/63 (05/04 0730) Pulse Rate: 63 (05/04 0730)  Labs: Recent Labs    03/27/23 1409 03/27/23 1920 03/28/23 0547 03/28/23 1650 03/29/23 0031 03/29/23 0428 03/29/23 1022  HGB 8.3*  --  7.9*  --   --  8.2*  --   HCT 28.0*  --  25.2*  --   --  26.8*  --   PLT 297  --  275  --   --  301  --   APTT 38*   < >  --  41* 136*  --  136*  LABPROT 17.1*  --   --  17.6*  --   --   --   INR 1.4*  --   --  1.5*  --   --   --   HEPARINUNFRC  --   --   --  >1.10*  --  >1.10*  --   CREATININE 1.20  --  1.08  --   --  1.05  --    < > = values in this interval not displayed.     Estimated Creatinine Clearance: 46.8 mL/min (by C-G formula based on SCr of 1.05 mg/dL).   Medical History: Past Medical History:  Diagnosis Date   Alcohol abuse    Dementia (HCC)    GERD (gastroesophageal reflux disease)    HLD (hyperlipidemia)    HTN (hypertension)    Seizures (HCC)    Stroke (HCC)     Medications:  Apixaban 5 mg PO twice daily (last dose 5/2 @ 1610)  Assessment: 82 y.o. male with PMH including stroke, CAD, Afib on Eliquis, HTN, HLD, anemia who presents with slurred speech/CVA. Onset of symptoms on 5/2 @ 1230. Also initially had right arm weakness which has now resolved. Pt is scheduled for open left CEA on 5/7 and pharmacy has been consulted to manage peri-operative anticoagulation with heparin. Provider requested to omit bolus ISO recent stroke and communicated that plan is to stop the heparin 6 hrs before CEA planned 5/7.   Date Time aPTT/HL Rate/Comment 0503 1724 41/>1.10 Baseline 0504 0031 aPTT  136 Supratherapeutic 0504 1022 aPTT  136 Supratherapeutic.   Goal of Therapy:  Heparin level 0.3-0.5 units/ml once heparin level and aPTT correlate.  aPTT 66-85 seconds Monitor platelets by anticoagulation protocol: Yes   Plan:  aPTT is supratherapeutic. Will hold heparin for 1 hour and decrease heparin infusion to 500 units/hr. Recheck aPTT in 8 hours. CBC and heparin level with AM labs. Switch to heparin level monitoring once aPTT and heparin level correlate.   Paschal Dopp, PharmD 03/29/2023 11:27 AM

## 2023-03-29 NOTE — Progress Notes (Signed)
Physical Therapy Treatment Patient Details Name: Richard Carter MRN: [id number] DOB: 02-07-1941 Today's Date: 03/29/2023   History of Present Illness Pt is an 82 y.o. male presenting to hospital 03/27/23 with sudden onset of difficulties with speech; R sided weakness also noted but resolved; R sided sensation changes noted as well as facial droop.  Imaging showing acute infarct in the postcentral gyrus on L.  Pt admitted with stroke, stenosis of L carotid artery, a-fib, and htn.  Recent hospitalization 3/24-3/29 for stroke.  PMH includes alcohol abuse, dementia, htn, seizures, stroke (with L sided hemiparesis), a-fib, AKI, pubic ramus and acetabular fx 2020, UTI, chronic L middle finger contracture.  Per notes pt also with h/o R UE weakness.    PT Comments    Pt progressed with increased gait distance with RW (34ft CGA - MinA), requires for safe RW management to keep walker close to himself.  Overall, pt is min guard for bed mobility and transfers.  Pt continues to report fatigue and general weakness and activity tolerance is limited. Continue to recommend that pt would require 24/7 assist for safe home discharge and would continue to benefit from PT to increase functional mobility and reduce burden of care.  Recommendations for follow up therapy are one component of a multi-disciplinary discharge planning process, led by the attending physician.  Recommendations may be updated based on patient status, additional functional criteria and insurance authorization.  Follow Up Recommendations  Can patient physically be transported by private vehicle: Yes    Assistance Recommended at Discharge Frequent or constant Supervision/Assistance  Patient can return home with the following A little help with walking and/or transfers;A little help with bathing/dressing/bathroom;Assistance with cooking/housework;Direct supervision/assist for medications management;Assist for transportation;Help with stairs or ramp for  entrance   Equipment Recommendations  None recommended by PT    Recommendations for Other Services OT consult     Precautions / Restrictions Precautions Precautions: Fall Precaution Comments: Seizure precautions Restrictions Weight Bearing Restrictions: No     Mobility  Bed Mobility Overal bed mobility: Needs Assistance Bed Mobility: Supine to Sit     Supine to sit: Min guard     General bed mobility comments: increased time, cues for use of bedrail.    Transfers Overall transfer level: Needs assistance Equipment used: Rolling walker (2 wheels) Transfers: Sit to/from Stand, Bed to chair/wheelchair/BSC Sit to Stand: Min assist   Step pivot transfers: Min assist       General transfer comment: vc's for UE placement; assist to steady; cues on how to manage walker with side stepping up to [facility] and for keeping walker close for turning.    Ambulation/Gait Ambulation/Gait assistance: Min assist Gait Distance (Feet): 30 Feet Assistive device: Rolling walker (2 wheels) Gait Pattern/deviations: Narrow base of support, Trunk flexed, Shuffle, Knee flexed in stance - right, Knee flexed in stance - left Gait velocity: decreased     General Gait Details: forward flexed posture; pt pushing RW far in front of pt requiring cueing and assist to keep RW closer; assist to steady; decreased B LE step length/foot clearance; slight imbalance when turning requiring min A.   Stairs             Wheelchair Mobility    Modified Rankin (Stroke Patients Only)       Balance Overall balance assessment: Needs assistance Sitting-balance support: No upper extremity supported, Feet supported Sitting balance-Leahy Scale: Good Sitting balance - Comments: steady seated EOB   Standing balance support: Bilateral upper extremity supported,  During functional activity, Reliant on assistive device for balance Standing balance-Leahy Scale: Poor Standing balance comment: assist for balance  during ambulation                            Cognition Arousal/Alertness: Lethargic Behavior During Therapy: Flat affect Overall Cognitive Status: History of cognitive impairments - at baseline                                 General Comments: Pt reported fatigue and being hungry due to being NPO this morning due to falied swallow study.  RN and SLP aware.        Exercises      General Comments        Pertinent Vitals/Pain Pain Assessment Pain Assessment: No/denies pain    Home Living                          Prior Function            PT Goals (current goals can now be found in the care plan section) Acute Rehab PT Goals Patient Stated Goal: to improve strength and mobility PT Goal Formulation: With patient Time For Goal Achievement: 04/11/23 Potential to Achieve Goals: Good Progress towards PT goals: Progressing toward goals    Frequency    Min 4X/week      PT Plan Current plan remains appropriate    Co-evaluation              AM-PAC PT "6 Clicks" Mobility   Outcome Measure                   End of Session Equipment Utilized During Treatment: Gait belt Activity Tolerance: Patient limited by fatigue Patient left: in bed;with call bell/phone within reach;with nursing/sitter in room Nurse Communication: Mobility status;Precautions PT Visit Diagnosis: Unsteadiness on feet (R26.81);Other abnormalities of gait and mobility (R26.89);Muscle weakness (generalized) (M62.81)     Time: 1610-9604 PT Time Calculation (min) (ACUTE ONLY): 22 min  Charges:  $Therapeutic Activity: 8-22 mins                    Hortencia Conradi, PTA  03/29/23, 12:02 PM

## 2023-03-29 NOTE — Consult Note (Addendum)
ANTICOAGULATION CONSULT NOTE  Pharmacy Consult for heparin Indication: atrial fibrillation ISO recent stroke  No Known Allergies  Patient Measurements: Height: 5' 7.01" (170.2 cm) Weight: 62.1 kg (136 lb 14.5 oz) IBW/kg (Calculated) : 66.12 Heparin Dosing Weight: 62.1  Vital Signs: Temp: 97.7 F (36.5 C) (05/03 2340) Temp Source: Oral (05/03 1946) BP: 154/62 (05/04 0124) Pulse Rate: 50 (05/04 0124)  Labs: Recent Labs    03/27/23 1409 03/27/23 1920 03/28/23 0547 03/28/23 1650 03/29/23 0031  HGB 8.3*  --  7.9*  --   --   HCT 28.0*  --  25.2*  --   --   PLT 297  --  275  --   --   APTT 38* 39*  --  41* 136*  LABPROT 17.1*  --   --  17.6*  --   INR 1.4*  --   --  1.5*  --   HEPARINUNFRC  --   --   --  >1.10*  --   CREATININE 1.20  --  1.08  --   --      Estimated Creatinine Clearance: 45.5 mL/min (by C-G formula based on SCr of 1.08 mg/dL).   Medical History: Past Medical History:  Diagnosis Date   Alcohol abuse    Dementia (HCC)    GERD (gastroesophageal reflux disease)    HLD (hyperlipidemia)    HTN (hypertension)    Seizures (HCC)    Stroke (HCC)     Medications:  Apixaban 5 mg PO twice daily (last dose 5/2 @ 4098)  Assessment: 82 y.o. male with PMH including stroke, CAD, Afib on Eliquis, HTN, HLD, anemia who presents with slurred speech/CVA. Onset of symptoms on 5/2 @ 1230. Also initially had right arm weakness which has now resolved. Pt is scheduled for open left CEA on 5/7 and pharmacy has been consulted to manage peri-operative anticoagulation with heparin. Provider requested to omit bolus ISO recent stroke and communicated that plan is to stop the heparin 6 hrs before CEA planned 5/7.  Baseline Labs: aPTT - 41; INR - 1.5; Hgb - 7.9; PLT - 275  Date Time aPTT/HL Rate/Comment 0503 1724 41/>1.10 Baseline 0504 0031 aPTT 136 Supratherapeutic  Goal of Therapy:  Heparin level 0.3-0.5 units/ml aPTT 66-85 seconds Monitor platelets by anticoagulation  protocol: Yes   Plan:  Decrease heparin infusion to 650 units/hr Recheck aPTT in 8 hr after rate change.  Titrate by aPTT's until lab correlation is noted, then titrate by anti-xa alone. Recheck HL daily Continue to monitor H&H and platelets daily while on heparin gtt.  Otelia Sergeant, PharmD, MBA 03/29/2023 1:30 AM

## 2023-03-29 NOTE — Progress Notes (Signed)
Speech Language Pathology Treatment: Dysphagia  Patient Details Name: Richard Carter MRN: [id number] DOB: Dec 27, 1940 Today's Date: 03/29/2023 Time: 4098-1191 SLP Time Calculation (min) (ACUTE ONLY): 45 min  Assessment / Plan / Recommendation Clinical Impression  Pt seen for ongoing assessment of swallowing post initial evaluation yesterday. Pt is currently NPO d/t concern for aspiration during evaluation. Pt on room air, afrebrile, WBC WNL.  Pt has a h/o Dysphagia on a dysphagia level 1 (puree) w/ Nectar liquids a previous admit>SNF>home w/ Caregiver for ADLs. Caregiver, [name], in room stated pt has stated pt has eaten a "mostly pureed diet at home that I fixed him", per his description in the past.  Pt is currently drowsy w/ brief alert State. He required constant verbal/tactile/visual stim by this SLP to maintain alertness during po trials -- slow oral phase and even min oral holding occurred when pt drifted asleep again w/ the bolus held orally. When cued/awake, pt exhibited grossly functional swallowing of Purees; immediate/overt coughing w/ Nectar tsp trials (1/5). Unsure of d/t pharyngeal swallowing deficits or decreased alertness.   In setting of pt's inconsistent alert State, recommend continued NPO status w/ frequent oral care for hygiene and stimulation, and therapeutic trials of Applesauce w/ NSG Supervision when providing daily cares. Strict aspiration precautions. ST services will f/u w/ ongoing assessment, objective assessment, and education w/ pt/Family/POA. MD/NSG updated -- NSG in room during tx session and witnessed pt's waxing/waning alert State. Caregiver, [name], arrived and was updated.       HPI HPI: This is a 82 y.o. male  Richard Carter is a 82 y.o. male with medical history significant of stroke, high-grade left carotid artery stenosis, seizure, A-fib on Eliquis, hypertension, hyperlipidemia, dementia, anemia, former smoker, alcohol abuse in remission for more than 3 years,  who presents with slurred speech. Patient was recently hospitalized from 3/24-3/29 due to stroke.  Patient was found to have high-grade left carotid artery stenosis.  Dr. Gilda Crease of [facility] is following up with the patient. Patient was seen and in the process of bring scheduled outpatient for an Open Carotid Endarterectomy in the coming weeks. Current MRI revealed Acute infarct in the postcentral gyrus on the left 2. Sequela of severe chronic microvascular ischemic change with advanced generalized volume and likely a chronic left basal ganglia  hemorrhage.      SLP Plan  Continue with current plan of care      Recommendations for follow up therapy are one component of a multi-disciplinary discharge planning process, led by the attending physician.  Recommendations may be updated based on patient status, additional functional criteria and insurance authorization.    Recommendations  Diet recommendations: NPO (w/ therapeutic applesauce w/ NSG) Medication Administration: Via alternative means Supervision: Staff to assist with self feeding;Full supervision/cueing for compensatory strategies Compensations: Minimize environmental distractions;Slow rate;Small sips/bites;Lingual sweep for clearance of pocketing;Multiple dry swallows after each bite/sip Postural Changes and/or Swallow Maneuvers: Out of bed for meals;Seated upright 90 degrees;Upright 30-60 min after meal                 (Palliative care; Dietician) Oral care QID;Oral care before and after PO;Staff/trained caregiver to provide oral care   Frequent or constant Supervision/Assistance Dysphagia, oropharyngeal phase (R13.12)     Continue with current plan of care       Jerilynn Som, MS, CCC-SLP Speech Language Pathologist Rehab Services; [facility] [phone] (ascom) Exavior Kimmons  03/29/2023, 1:27 PM

## 2023-03-29 NOTE — Consult Note (Signed)
ANTICOAGULATION CONSULT NOTE  Pharmacy Consult for heparin Indication: atrial fibrillation ISO recent stroke  No Known Allergies  Patient Measurements: Height: 5' 7.01" (170.2 cm) Weight: 62.1 kg (136 lb 14.5 oz) IBW/kg (Calculated) : 66.12 Heparin Dosing Weight: 62.1  Vital Signs: Temp: 97.5 F (36.4 C) (05/04 1530) BP: 151/69 (05/04 1530) Pulse Rate: 48 (05/04 1530)  Labs: Recent Labs    03/27/23 1409 03/27/23 1920 03/28/23 0547 03/28/23 1650 03/29/23 0031 03/29/23 0428 03/29/23 1022 03/29/23 1949  HGB 8.3*  --  7.9*  --   --  8.2*  --   --   HCT 28.0*  --  25.2*  --   --  26.8*  --   --   PLT 297  --  275  --   --  301  --   --   APTT 38*   < >  --  41* 136*  --  136* 65*  LABPROT 17.1*  --   --  17.6*  --   --   --   --   INR 1.4*  --   --  1.5*  --   --   --   --   HEPARINUNFRC  --   --   --  >1.10*  --  >1.10*  --   --   CREATININE 1.20  --  1.08  --   --  1.05  --   --    < > = values in this interval not displayed.     Estimated Creatinine Clearance: 46.8 mL/min (by C-G formula based on SCr of 1.05 mg/dL).   Medical History: Past Medical History:  Diagnosis Date   Alcohol abuse    Dementia (HCC)    GERD (gastroesophageal reflux disease)    HLD (hyperlipidemia)    HTN (hypertension)    Seizures (HCC)    Stroke (HCC)     Medications:  Apixaban 5 mg PO twice daily (last dose 5/2 @ 1610)  Assessment: 82 y.o. male with PMH including stroke, CAD, Afib on Eliquis, HTN, HLD, anemia who presents with slurred speech/CVA. Onset of symptoms on 5/2 @ 1230. Also initially had right arm weakness which has now resolved. Pt is scheduled for open left CEA on 5/7 and pharmacy has been consulted to manage peri-operative anticoagulation with heparin. Provider requested to omit bolus ISO recent stroke and communicated that plan is to stop the heparin 6 hrs before CEA planned 5/7.   Date Time aPTT/HL Rate/Comment 0503 1724 41/>1.10 Baseline 0504 0031 aPTT  136 Supratherapeutic 0504 1022 aPTT 136 Supratherapeutic 0504 1949 aPTT 65 Slightly below goal range (500un/hr > 550un/hr)  Goal of Therapy:  Heparin level 0.3-0.5 units/ml once heparin level and aPTT correlate.  aPTT 66-85 seconds Monitor platelets by anticoagulation protocol: Yes   Plan:  aPTT slightly below goal range. Will increase heparin infusion to 550 units/hr. Recheck aPTT, CBC and heparin level with AM labs. Switch to heparin level monitoring once aPTT and heparin level correlate.   Sahvannah Rieser Rodriguez-Guzman PharmD, BCPS 03/29/2023 8:50 PM

## 2023-03-29 NOTE — Progress Notes (Addendum)
       CROSS COVER NOTE  NAME: Richard Carter MRN: [id number] DOB : May 09, 1941    HPI/Events of Note   Report:pause reported from ccmd to nurse of 2.65 sec. Asymptomatic  On review of chart:strip reviewed - missed beat vs pause  Carotid stenosis, on sched for carotid endarterectomy outpatient  On beta blocker for paroxysmal a fib - has been in sinus and brady at times   Assessment and  Interventions   Assessment: VSS Plan: Hold beta blocker for heart rate below 60 - appears drug already discontinued Continue tele monitoring EKG - sinus bradycardia with PAC       Donnie Mesa NP [facility]

## 2023-03-30 DIAGNOSIS — I6359 Cerebral infarction due to unspecified occlusion or stenosis of other cerebral artery: Secondary | ICD-10-CM | POA: Diagnosis not present

## 2023-03-30 LAB — APTT
aPTT: 69 seconds — ABNORMAL HIGH (ref 24–36)
aPTT: 79 seconds — ABNORMAL HIGH (ref 24–36)

## 2023-03-30 LAB — COMPREHENSIVE METABOLIC PANEL
ALT: 15 U/L (ref 0–44)
AST: 18 U/L (ref 15–41)
Albumin: 3.6 g/dL (ref 3.5–5.0)
Alkaline Phosphatase: 60 U/L (ref 38–126)
Anion gap: 6 (ref 5–15)
BUN: 19 mg/dL (ref 8–23)
CO2: 20 mmol/L — ABNORMAL LOW (ref 22–32)
Calcium: 9.5 mg/dL (ref 8.9–10.3)
Chloride: 113 mmol/L — ABNORMAL HIGH (ref 98–111)
Creatinine, Ser: 1.05 mg/dL (ref 0.61–1.24)
GFR, Estimated: >60 mL/min (ref >60)
Glucose, Bld: 74 mg/dL (ref 70–99)
Potassium: 3.7 mmol/L (ref 3.5–5.1)
Sodium: 139 mmol/L (ref 135–145)
Total Bilirubin: 0.6 mg/dL (ref 0.3–1.2)
Total Protein: 6.5 g/dL (ref 6.5–8.1)

## 2023-03-30 LAB — CBC
HCT: 25 % — ABNORMAL LOW (ref 39.0–52.0)
Hemoglobin: 7.8 g/dL — ABNORMAL LOW (ref 13.0–17.0)
MCH: 25.6 pg — ABNORMAL LOW (ref 26.0–34.0)
MCHC: 31.2 g/dL (ref 30.0–36.0)
MCV: 82 fL (ref 80.0–100.0)
Platelets: 305 10*3/uL (ref 150–400)
RBC: 3.05 MIL/uL — ABNORMAL LOW (ref 4.22–5.81)
RDW: 16.3 % — ABNORMAL HIGH (ref 11.5–15.5)
WBC: 6.6 10*3/uL (ref 4.0–10.5)
nRBC: 0 % (ref 0.0–0.2)

## 2023-03-30 LAB — HEPARIN LEVEL (UNFRACTIONATED): Heparin Unfractionated: >1.1 IU/mL — ABNORMAL HIGH (ref 0.30–0.70)

## 2023-03-30 MED ORDER — HEPARIN (PORCINE) 25000 UT/250ML-% IV SOLN
600.0000 [IU]/h | INTRAVENOUS | Status: DC
  Administered 2023-03-30: 550 [IU]/h via INTRAVENOUS
  Administered 2023-03-31: 600 [IU]/h via INTRAVENOUS
  Filled 2023-03-30: qty 250

## 2023-03-30 MED ORDER — APIXABAN 5 MG PO TABS: 5.0000 mg | ORAL_TABLET | Freq: Two times a day (BID) | ORAL | Status: DC

## 2023-03-30 NOTE — Progress Notes (Signed)
PROGRESS NOTE  Richard Carter:[id number] DOB: Mar 29, 1941 DOA: 03/27/2023 PCP: Marisue Ivan, MD  Brief History   Richard Carter is a 82 y.o. 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  Found to have recurrent stroke.  Neurology and vascular surgery involved.  Plan for left CEA on 5/7.  Currently on IV heparin.    On 03/29/2023 SLP recommended n.p.o. status due to high risk for aspiration. I have discussed this with the patient's POA Renaldo Reel. He does not feel that the patient would want to have a feeding tube. He states that the patient's [place]. He will discuss it with him.  SLP re-evaluated the patient on 03/30/2023. The patient's diet has been up-graded to Dys-1.  A & P  Assessment and Plan: Acute ischemic stroke MRI-brain showed acute infarct in the postcentral gyrus on the left. Neurology following. Carotid Dopplers unchanged from prior stenosis. Currently allowing permissive hypertension. Patient was on Eliquis.  Due to n.p.o. status the patient was switched to heparin.  Cholesterol medications restarted as diet has been upgraded. Dysphagia - SLP was consulted.  Initially SLP was consulted for bedside swallowing evaluation and they initially recommended n.p.o. status. PT OT were also consulted. Monitor on telemetry. Recently had an echocardiogram with preserved EF without any wall motion abnormality. Palliative care has been consulted.   Dysphagia The patient was evaluated by SLP yesterday. The patient was deemed to be at high risk for aspiration. For that reason he was made NPO on 03/28/2023. SLP has re-evaluated the patient today as he was somewhat lethargic yesterday. The patient's diet will be upgraded to DYS-1  today.    Stenosis of left carotid artery:  pt has high-grade left carotid arteries stenosis -consulted Dr. Wyn Quaker of [facility] Scheduled for outpatient procedure.  Currently scheduled for left CEA on 5/7. The patient is on IV heparin.   paroxysmal atrial fibrillation Heart rate 58, 120 Currently rate controlled. Continuing metoprolol. Eliquis on hold and switch to heparin due to plans for CEA.   Seizures (HCC) -Seizure precaution -As needed Ativan for seizure -Continue home Keppra, now IV.   HTN (hypertension): Allowing permissive hypertension. -hold amlodipine, Cozaar -Continue metoprolol which is for rate control for atrial fibrillation   HLD: - Continue his home atorvastatin   Dementia (HCC): No behavior disturbance -Fall precaution  I have seen and examined this patient myself. I have spent 32 minutes in his evaluation and care.  DVT prophylaxis: Heparin drip Code Status: DNR Family Communication: I have discussed the patient with Renaldo Reel, POA for medical decision making. Disposition Plan: Home with home health vs SNF. TOC to follow up with [facility] Monday.    Makale Pindell, DO [facility] Direct contact: see [email or web address]  7PM-7AM contact night coverage as above 03/29/2023, 11:40 AM  LOS: 2 days   Consultants  Vascular Surgery Neurology  Procedures  None  Antibiotics   Anti-infectives (From admission, onward)    None      Interval History/Subjective  The patient is resting comfortably today. No new complaints.  Objective   Vitals:  Vitals:   03/29/23 0500 03/29/23 0730  BP: (!) 146/70 132/63  Pulse: 61 63  Resp:  16  Temp:  (!) 97.5 F (36.4 C)  SpO2:  100%    Exam:  Constitutional:  Appears calm and comfortable. Speech is thick and unintellligible Respiratory:  CTA bilaterally, no w/r/r.  Respiratory effort normal. No retractions or accessory muscle use Cardiovascular:  RRR, no m/r/g No LE extremity edema   Normal pedal  pulses Abdomen:  Abdomen appears normal; no tenderness or masses No hernias No HSM Musculoskeletal:  Digits/nails BUE: no clubbing, cyanosis, petechiae, infection exam of joints, bones, muscles of at least one of following: head/neck, RUE, LUE, RLE, LLE   strength and tone normal, no atrophy, no abnormal movements No tenderness, masses Normal ROM, no contractures  gait and station Skin:  No rashes, lesions, ulcers palpation of skin: no induration or nodules Psychiatric:  Pt is unable to cooperate with exam.   I have personally reviewed the following:   Today's Data   Vitals:   03/30/23 0946 03/30/23 1208  BP: (!) 167/72 (!) 147/107  Pulse: 72 84  Resp: 16 16  Temp: 98 F (36.7 C) 97.8 F (36.6 C)  SpO2: 100% 97%    Lab Data   CBC    Component Value Date/Time   WBC 6.6 03/30/2023 0414   RBC 3.05 (L) 03/30/2023 0414   HGB 7.8 (L) 03/30/2023 0414   HGB 12.9 (L) 10/25/2014 1048   HCT 25.0 (L) 03/30/2023 0414   HCT 38.6 (L) 10/25/2014 1048   PLT 305 03/30/2023 0414   PLT 93 (L) 10/25/2014 1048   MCV 82.0 03/30/2023 0414   MCV 108 (H) 10/25/2014 1048   MCH 25.6 (L) 03/30/2023 0414   MCHC 31.2 03/30/2023 0414   RDW 16.3 (H) 03/30/2023 0414   RDW 19.6 (H) 10/25/2014 1048   LYMPHSABS 1.4 03/27/2023 1409   LYMPHSABS 0.8 (L) 10/25/2014 1048   MONOABS 0.8 03/27/2023 1409   MONOABS 0.9 10/25/2014 1048   EOSABS 0.4 03/27/2023 1409   EOSABS 0.0 10/25/2014 1048   BASOSABS 0.1 03/27/2023 1409   BASOSABS 0.0 10/25/2014 1048       Latest Ref Rng & Units 03/30/2023    4:14 AM 03/29/2023    4:28 AM 03/28/2023    5:47 AM  BMP  Glucose 70 - 99 mg/dL 74  82  89   BUN 8 - 23 mg/dL 19  20  18    Creatinine 0.61 - 1.24 mg/dL 1.30  8.65  7.84   Sodium 135 - 145 mmol/L 139  139  139   Potassium 3.5 - 5.1 mmol/L 3.7  3.9  3.3   Chloride 98 - 111 mmol/L 113  114  111   CO2 22 - 32 mmol/L 20  19  21    Calcium 8.9 - 10.3 mg/dL 9.5  9.8  9.2     Micro Data   Results for orders  placed or performed during the hospital encounter of 02/16/23  Culture, blood (Routine X 2) w Reflex to ID Panel     Status: None   Collection Time: 02/16/23  5:48 PM   Specimen: BLOOD  Result Value Ref Range Status   Specimen Description BLOOD BLOOD RIGHT ARM  Final   Special Requests   Final    BOTTLES DRAWN AEROBIC ONLY Blood Culture results may not be optimal due to an inadequate volume of blood received in culture bottles   Culture   Final    NO GROWTH 5 DAYS Performed at [facility], [address], [place], [place] [address]    Report Status 02/21/2023 FINAL  Final  Urine Culture     Status: None   Collection Time: 02/16/23  8:41 PM   Specimen: Urine, Random  Result Value Ref Range Status   Specimen Description   Final    URINE, RANDOM Performed at [facility], [address] [place], [place] [address]    Special Requests   Final    NONE Reflexed from [id number] Performed at [facility], [address], [place], [place] [address]    Culture   Final    NO GROWTH Performed at [facility], [address] [place], [place] [address]    Report Status 02/17/2023 FINAL  Final  Culture, blood (Routine X 2) w Reflex to ID Panel     Status: None   Collection Time: 02/16/23  9:17 PM   Specimen: BLOOD  Result Value Ref Range Status   Specimen Description BLOOD BLOOD LEFT HAND  Final   Special Requests   Final    BOTTLES DRAWN AEROBIC AND ANAEROBIC Blood Culture adequate volume   Culture   Final    NO GROWTH 5 DAYS Performed at [facility], [address], [place], [place] [address]    Report Status 02/21/2023 FINAL  Final   Imaging  MRI BRain: Acute infarct in the post central gyrus on the left.  Sequelae of severe chronic microvascular ischemic change with advanced generalized volume an dlikely a chronic left basal ganglia hemorrhage.  Scheduled Meds:  pantoprazole (PROTONIX) IV  40 mg Intravenous Q24H   Continuous  Infusions:  heparin     lactated ringers 75 mL/hr at 03/29/23 1118   levETIRAcetam 500 mg (03/29/23 0942)    Principal Problem:   Stroke [facility]) Active Problems:   Stenosis of left carotid artery   AF (paroxysmal atrial fibrillation) (HCC)   Seizures (HCC)   HTN (hypertension)   Dementia (HCC)   HLD (hyperlipidemia)   LOS: 2 days

## 2023-03-30 NOTE — Consult Note (Signed)
ANTICOAGULATION CONSULT NOTE  Pharmacy Consult for heparin Indication: atrial fibrillation ISO recent stroke  No Known Allergies  Patient Measurements: Height: 5' 7.01" (170.2 cm) Weight: 62.1 kg (136 lb 14.5 oz) IBW/kg (Calculated) : 66.12 Heparin Dosing Weight: 62.1  Vital Signs: Temp: 97.8 F (36.6 C) (05/05 1208) Temp Source: Oral (05/05 0400) BP: 147/107 (05/05 1208) Pulse Rate: 84 (05/05 1208)  Labs: Recent Labs    03/27/23 1409 03/27/23 1920 03/28/23 0547 03/28/23 1650 03/29/23 0031 03/29/23 0428 03/29/23 1022 03/29/23 1949 03/30/23 0414 03/30/23 1259  HGB 8.3*  --  7.9*  --   --  8.2*  --   --  7.8*  --   HCT 28.0*  --  25.2*  --   --  26.8*  --   --  25.0*  --   PLT 297  --  275  --   --  301  --   --  305  --   APTT 38*   < >  --  41*   < >  --    < > 65* 69* 79*  LABPROT 17.1*  --   --  17.6*  --   --   --   --   --   --   INR 1.4*  --   --  1.5*  --   --   --   --   --   --   HEPARINUNFRC  --   --   --  >1.10*  --  >1.10*  --   --  >1.10*  --   CREATININE 1.20  --  1.08  --   --  1.05  --   --  1.05  --    < > = values in this interval not displayed.     Estimated Creatinine Clearance: 46.8 mL/min (by C-G formula based on SCr of 1.05 mg/dL).   Medical History: Past Medical History:  Diagnosis Date   Alcohol abuse    Dementia (HCC)    GERD (gastroesophageal reflux disease)    HLD (hyperlipidemia)    HTN (hypertension)    Seizures (HCC)    Stroke (HCC)     Medications:  Apixaban 5 mg PO twice daily (last dose 5/2 @ 0981)  Assessment: 82 y.o. male with PMH including stroke, CAD, Afib on Eliquis, HTN, HLD, anemia who presents with slurred speech/CVA. Onset of symptoms on 5/2 @ 1230. Also initially had right arm weakness which has now resolved. Pt is scheduled for open left CEA on 5/7 and pharmacy has been consulted to manage peri-operative anticoagulation with heparin. Provider requested to omit bolus ISO recent stroke and communicated that plan  is to stop the heparin 6 hrs before CEA planned 5/7.   Date Time aPTT/HL Rate/Comment 0503 1724 41/>1.10 Baseline 0504 0031 aPTT 136 Supratherapeutic 0504 1022 aPTT 136 Supratherapeutic 0504 1949 aPTT 65 Slightly below goal range (500un/hr > 550un/hr) 0505 0414 aPTT 69 Therapeutic x 1, HL not correlating 0505 1259 aPTT 79 Therapeutic x 2   Goal of Therapy:  Heparin level 0.3-0.5 units/ml once heparin level and aPTT correlate.  aPTT 66-85 seconds Monitor platelets by anticoagulation protocol: Yes    Plan:  Will continue heparin infusion at 550 units/hr. Recheck aPTT with AM labs Recheck CBC and heparin level with AM labs. Switch to heparin level monitoring once aPTT and heparin level correlate.   Will M. Dareen Piano, PharmD PGY-1 Pharmacy Resident 03/30/2023 1:39 PM

## 2023-03-30 NOTE — Progress Notes (Signed)
Speech Language Pathology Treatment: Dysphagia  Patient Details Name: Richard Carter MRN: [id number] DOB: Nov 28, 1940 Today's Date: 03/30/2023 Time: 1215-1230 SLP Time Calculation (min) (ACUTE ONLY): 15 min  Assessment / Plan / Recommendation Clinical Impression  Pt seen for clinical swallowing re-evaluation. Pt alert and cooperative. Confusion noted. Pt dependent for feeding. Pt given trials of puree, thin (via tsp, cup, straw), and nectar-thick (via tsp, cup, straw). Pt with s/sx at least a mild oral dysphagia c/b prolonged oral prep and A-P transit with puree. Oral phase was functional with liquid trials. Concern for highly suspected pharyngeal dysphagia given immediate cough with thin liquids via cup and straw. No overt s/sx pharyngeal dysphagia with nectar-thick liquids or puree. Of note, pt with tendency to take rapid, sequential sips of liquids via straw sip despite verbal cueing. Pt is at increased risk for aspiration/aspiration PNA given AMS/cognitive status, dependence for feeding at present, and comorbidities in setting of stroke. Recommend cautious initiation of a puree diet with nectar-thick liquids - NO STRAWS - safe swallowing strategies/aspiration precautions as outlined below. SLP to f/u per POC for diet tolerance, clinical swallowing re-evaluation, and consideration for MBSS.    HPI HPI: This is a 82 y.o. male  Richard Carter is a 82 y.o. 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 Patient was recently hospitalized from 3/24-3/29 due to stroke.  Patient was found to have high-grade left carotid artery stenosis.  Dr. Gilda Crease of [facility] is following up with the patient. Patient was seen and in the process of bring scheduled outpatient for an Open Carotid Endarterectomy in the  coming weeks. Current MRI revealed Acute infarct in the postcentral gyrus on the left 2. Sequela of severe chronic microvascular ischemic change with advanced generalized volume and likely a chronic left basal ganglia  hemorrhage.      SLP Plan  Continue with current plan of care;MBS (consideration for MBSS)      Recommendations for follow up therapy are one component of a multi-disciplinary discharge planning process, led by the attending physician.  Recommendations may be updated based on patient status, additional functional criteria and insurance authorization.    Recommendations  Liquids provided via: Teaspoon;Cup;No straw Medication Administration: Crushed with puree Supervision: Staff to assist with self feeding;Full supervision/cueing for compensatory strategies Compensations: Minimize environmental distractions;Slow rate;Small sips/bites;Lingual sweep for clearance of pocketing;Multiple dry swallows after each bite/sip Postural Changes and/or Swallow Maneuvers: Out of bed for meals;Seated upright 90 degrees;Upright 30-60 min after meal                 (Palliative Care; Dietician) Oral care QID;Oral care before and after PO;Staff/trained caregiver to provide oral care   Frequent or constant Supervision/Assistance Dysphagia, oropharyngeal phase (R13.12)     Continue with current plan of care;MBS (consideration for MBSS)    Richard Carter, M.S., CCC-SLP Speech-Language Pathologist [facility] [phone] Richard Carter)  Richard Carter  03/30/2023, 12:43 PM

## 2023-03-30 NOTE — Consult Note (Signed)
ANTICOAGULATION CONSULT NOTE  Pharmacy Consult for heparin Indication: atrial fibrillation ISO recent stroke  No Known Allergies  Patient Measurements: Height: 5' 7.01" (170.2 cm) Weight: 62.1 kg (136 lb 14.5 oz) IBW/kg (Calculated) : 66.12 Heparin Dosing Weight: 62.1  Vital Signs: Temp: 97.6 F (36.4 C) (05/05 0400) Temp Source: Oral (05/05 0400) BP: 151/66 (05/05 0400) Pulse Rate: 57 (05/05 0400)  Labs: Recent Labs    03/27/23 1409 03/27/23 1920 03/28/23 0547 03/28/23 1650 03/29/23 0031 03/29/23 0428 03/29/23 1022 03/29/23 1949 03/30/23 0414  HGB 8.3*  --  7.9*  --   --  8.2*  --   --  7.8*  HCT 28.0*  --  25.2*  --   --  26.8*  --   --  25.0*  PLT 297  --  275  --   --  301  --   --  305  APTT 38*   < >  --  41*   < >  --  136* 65* 69*  LABPROT 17.1*  --   --  17.6*  --   --   --   --   --   INR 1.4*  --   --  1.5*  --   --   --   --   --   HEPARINUNFRC  --   --   --  >1.10*  --  >1.10*  --   --  >1.10*  CREATININE 1.20  --  1.08  --   --  1.05  --   --  1.05   < > = values in this interval not displayed.     Estimated Creatinine Clearance: 46.8 mL/min (by C-G formula based on SCr of 1.05 mg/dL).   Medical History: Past Medical History:  Diagnosis Date   Alcohol abuse    Dementia (HCC)    GERD (gastroesophageal reflux disease)    HLD (hyperlipidemia)    HTN (hypertension)    Seizures (HCC)    Stroke (HCC)     Medications:  Apixaban 5 mg PO twice daily (last dose 5/2 @ 1610)  Assessment: 82 y.o. male with PMH including stroke, CAD, Afib on Eliquis, HTN, HLD, anemia who presents with slurred speech/CVA. Onset of symptoms on 5/2 @ 1230. Also initially had right arm weakness which has now resolved. Pt is scheduled for open left CEA on 5/7 and pharmacy has been consulted to manage peri-operative anticoagulation with heparin. Provider requested to omit bolus ISO recent stroke and communicated that plan is to stop the heparin 6 hrs before CEA planned  5/7.   Date Time aPTT/HL Rate/Comment 0503 1724 41/>1.10 Baseline 0504 0031 aPTT 136 Supratherapeutic 0504 1022 aPTT 136 Supratherapeutic 0504 1949 aPTT 65 Slightly below goal range (500un/hr > 550un/hr) 0505 0414 aPTT 69 Therapeutic x 1, HL not correlating  Goal of Therapy:  Heparin level 0.3-0.5 units/ml once heparin level and aPTT correlate.  aPTT 66-85 seconds Monitor platelets by anticoagulation protocol: Yes   Plan:  Will continue heparin infusion at 550 units/hr. Recheck aPTT in 8 hrs to confirm Recheck CBC and heparin level with AM labs. Switch to heparin level monitoring once aPTT and heparin level correlate.  Otelia Sergeant, PharmD, [facility] 03/30/2023 6:03 AM

## 2023-03-31 ENCOUNTER — Other Ambulatory Visit: Payer: Self-pay

## 2023-03-31 ENCOUNTER — Inpatient Hospital Stay: Payer: Medicare HMO

## 2023-03-31 DIAGNOSIS — I6359 Cerebral infarction due to unspecified occlusion or stenosis of other cerebral artery: Secondary | ICD-10-CM | POA: Diagnosis not present

## 2023-03-31 DIAGNOSIS — Z7189 Other specified counseling: Secondary | ICD-10-CM

## 2023-03-31 LAB — APTT
aPTT: 58 seconds — ABNORMAL HIGH (ref 24–36)
aPTT: 36 seconds (ref 24–36)
aPTT: 34 seconds (ref 24–36)

## 2023-03-31 LAB — CBC
HCT: 25.3 % — ABNORMAL LOW (ref 39.0–52.0)
Hemoglobin: 7.8 g/dL — ABNORMAL LOW (ref 13.0–17.0)
MCH: 25.3 pg — ABNORMAL LOW (ref 26.0–34.0)
MCHC: 30.8 g/dL (ref 30.0–36.0)
MCV: 82.1 fL (ref 80.0–100.0)
Platelets: 260 10*3/uL (ref 150–400)
RBC: 3.08 MIL/uL — ABNORMAL LOW (ref 4.22–5.81)
RDW: 16.6 % — ABNORMAL HIGH (ref 11.5–15.5)
WBC: 7.6 10*3/uL (ref 4.0–10.5)
nRBC: 0 % (ref 0.0–0.2)

## 2023-03-31 LAB — IRON AND TIBC
Iron: 26 ug/dL — ABNORMAL LOW (ref 45–182)
Saturation Ratios: 6 % — ABNORMAL LOW (ref 17.9–39.5)
TIBC: 417 ug/dL (ref 250–450)
UIBC: 391 ug/dL

## 2023-03-31 LAB — VITAMIN B12: Vitamin B-12: 165 pg/mL — ABNORMAL LOW (ref 180–914)

## 2023-03-31 LAB — HEPARIN LEVEL (UNFRACTIONATED): Heparin Unfractionated: 1.09 IU/mL — ABNORMAL HIGH (ref 0.30–0.70)

## 2023-03-31 LAB — FOLATE: Folate: 8.7 ng/mL (ref >5.9)

## 2023-03-31 MED ORDER — POLYSACCHARIDE IRON COMPLEX 150 MG PO CAPS
150.0000 mg | ORAL_CAPSULE | Freq: Every day | ORAL | Status: DC
  Administered 2023-04-04 – 2023-04-07 (×4): 150 mg via ORAL
  Filled 2023-03-31 (×4): qty 1

## 2023-03-31 MED ORDER — SODIUM CHLORIDE 0.9 % IV SOLN
250.0000 mg | Freq: Every day | INTRAVENOUS | Status: DC
  Administered 2023-03-31 – 2023-04-03 (×3): 250 mg via INTRAVENOUS
  Filled 2023-03-31 (×4): qty 20

## 2023-03-31 MED ORDER — AMLODIPINE BESYLATE 5 MG PO TABS
5.0000 mg | ORAL_TABLET | Freq: Every day | ORAL | Status: DC
  Administered 2023-03-31 – 2023-04-07 (×6): 5 mg via ORAL
  Filled 2023-03-31 (×7): qty 1

## 2023-03-31 MED ORDER — CHLORHEXIDINE GLUCONATE CLOTH 2 % EX PADS
6.0000 | MEDICATED_PAD | Freq: Every day | CUTANEOUS | Status: DC
  Administered 2023-03-31 – 2023-04-07 (×9): 6 via TOPICAL

## 2023-03-31 MED ORDER — SODIUM CHLORIDE 0.9% FLUSH
10.0000 mL | Freq: Two times a day (BID) | INTRAVENOUS | Status: DC
  Administered 2023-03-31 – 2023-04-07 (×10): 10 mL

## 2023-03-31 MED ORDER — SODIUM CHLORIDE 0.9% FLUSH: 10.0000 mL | INTRAVENOUS | Status: DC | PRN

## 2023-03-31 MED ORDER — VITAMIN C 500 MG PO TABS
500.0000 mg | ORAL_TABLET | Freq: Every day | ORAL | Status: DC
  Administered 2023-04-04 – 2023-04-07 (×4): 500 mg via ORAL
  Filled 2023-03-31 (×4): qty 1

## 2023-03-31 NOTE — Progress Notes (Signed)
Physical Therapy Treatment Patient Details Name: Richard Carter MRN: [id number] DOB: [birth year] Today's Date: 03/31/2023   History of Present Illness Pt is an 82 y.o. male presenting to hospital 03/27/23 with sudden onset of difficulties with speech; R sided weakness also noted but resolved; R sided sensation changes noted as well as facial droop.  Imaging showing acute infarct in the postcentral gyrus on L.  Pt admitted with stroke, stenosis of L carotid artery, a-fib, and htn.  Recent hospitalization 3/24-3/29 for stroke.  PMH includes alcohol abuse, dementia, htn, seizures, stroke (with L sided hemiparesis), a-fib, AKI, pubic ramus and acetabular fx 2020, UTI, chronic L middle finger contracture.  Per notes pt also with h/o R UE weakness.    PT Comments    Pt sitting on edge of bed upon PT arrival; pt's sister assisting with pt eating pureed meal.  No c/o pain.  During session pt min assist with transfers and ambulation 10 feet with RW use (assist for balance and to keep RW closer when walking).  Distance ambulated limited d/t pt fatigue and generalized weakness; pt's HR also increased from 118 bpm at rest to 141 bpm with limited activity (nurse notified).  Pt noted to be coughing a lot and spitting up mucus (pt repositioned sitting upright in bed end of session; nurse notified and came to assess pt).  Will continue to focus on strengthening and progressive functional mobility per pt tolerance.    Recommendations for follow up therapy are one component of a multi-disciplinary discharge planning process, led by the attending physician.  Recommendations may be updated based on patient status, additional functional criteria and insurance authorization.  Follow Up Recommendations  Can patient physically be transported by private vehicle: Yes    Assistance Recommended at Discharge Frequent or constant Supervision/Assistance  Patient can return home with the following A little help with walking and/or  transfers;A little help with bathing/dressing/bathroom;Assistance with cooking/housework;Direct supervision/assist for medications management;Assist for transportation;Help with stairs or ramp for entrance   Equipment Recommendations  Rolling walker (2 wheels);Wheelchair (measurements PT);Wheelchair cushion (measurements PT)    Recommendations for Other Services OT consult     Precautions / Restrictions Precautions Precautions: Fall Precaution Comments: Seizure precautions Restrictions Weight Bearing Restrictions: No     Mobility  Bed Mobility Overal bed mobility: Needs Assistance Bed Mobility: Sit to Supine       Sit to supine: Supervision, HOB elevated   General bed mobility comments: increased time/effort to perform on own    Transfers Overall transfer level: Needs assistance Equipment used: Rolling walker (2 wheels) Transfers: Sit to/from Stand Sit to Stand: Min assist           General transfer comment: vc's for UE placement; assist to steady    Ambulation/Gait Ambulation/Gait assistance: Min assist Gait Distance (Feet): 10 Feet Assistive device: Rolling walker (2 wheels)   Gait velocity: decreased     General Gait Details: forward flexed posture; pt pushing RW far in front of pt requiring cueing and assist to keep RW closer; assist to steady; decreased B LE step length/foot clearance   Stairs             Wheelchair Mobility    Modified Rankin (Stroke Patients Only)       Balance Overall balance assessment: Needs assistance Sitting-balance support: No upper extremity supported, Feet supported Sitting balance-Leahy Scale: Good Sitting balance - Comments: steady sitting reaching within BOS   Standing balance support: Bilateral upper extremity supported, During functional activity, Reliant  on assistive device for balance Standing balance-Leahy Scale: Poor Standing balance comment: assist for balance during ambulation                             Cognition Arousal/Alertness: Awake/alert Behavior During Therapy: Flat affect Overall Cognitive Status: History of cognitive impairments - at baseline                                          Exercises      General Comments General comments (skin integrity, edema, etc.): coughing noted during session (nurse notified).  Nursing cleared pt for participation in physical therapy.  Pt agreeable to PT session.  Pt's sister present during session.      Pertinent Vitals/Pain Pain Assessment Pain Assessment: No/denies pain SpO2 sats 100% on room air end of session.    Home Living                          Prior Function            PT Goals (current goals can now be found in the care plan section) Acute Rehab PT Goals Patient Stated Goal: to improve strength and mobility PT Goal Formulation: With patient Time For Goal Achievement: 04/11/23 Potential to Achieve Goals: Good Progress towards PT goals: Progressing toward goals    Frequency    Min 4X/week      PT Plan Current plan remains appropriate    Co-evaluation              AM-PAC PT "6 Clicks" Mobility   Outcome Measure  Help needed turning from your back to your side while in a flat bed without using bedrails?: None Help needed moving from lying on your back to sitting on the side of a flat bed without using bedrails?: A Little Help needed moving to and from a bed to a chair (including a wheelchair)?: A Little Help needed standing up from a chair using your arms (e.g., wheelchair or bedside chair)?: A Little Help needed to walk in hospital room?: A Little Help needed climbing 3-5 steps with a railing? : A Lot 6 Click Score: 18    End of Session Equipment Utilized During Treatment: Gait belt Activity Tolerance: Patient limited by fatigue Patient left: in bed;with call bell/phone within reach;with bed alarm set;with family/visitor present Nurse Communication:  Mobility status;Precautions PT Visit Diagnosis: Unsteadiness on feet (R26.81);Other abnormalities of gait and mobility (R26.89);Muscle weakness (generalized) (M62.81)     Time: 1610-9604 PT Time Calculation (min) (ACUTE ONLY): 23 min  Charges:  $Therapeutic Activity: 23-37 mins                     Hendricks Limes, PT 03/31/23, 3:46 PM

## 2023-03-31 NOTE — Care Management Important Message (Signed)
Important Message  Patient Details  Name: Richard Carter MRN: [id number] Date of Birth: [birth year]   Medicare Important Message Given:  Yes     Johnell Comings 03/31/2023, 12:16 PM

## 2023-03-31 NOTE — Progress Notes (Signed)
Peripherally Inserted Central Catheter Placement  The IV Nurse has discussed with the patient and/or persons authorized to consent for the patient, the purpose of this procedure and the potential benefits and risks involved with this procedure.  The benefits include less needle sticks, lab draws from the catheter, and the patient may be discharged home with the catheter. Risks include, but not limited to, infection, bleeding, blood clot (thrombus formation), and puncture of an artery; nerve damage and irregular heartbeat and possibility to perform a PICC exchange if needed/ordered by physician.  Alternatives to this procedure were also discussed.  Bard Power PICC patient education guide, fact sheet on infection prevention and patient information card has been provided to patient /or left at bedside.  PICC placed by Conrad Guayama, RN.  Consent obtained from [facility] due to altered mental status.  PICC Placement Documentation  PICC Double Lumen 03/31/23 Left Brachial 41 cm 0 cm (Active)  Indication for Insertion or Continuance of Line Limited venous access - need for IV therapy >5 days (PICC only) 03/31/23 1752  Exposed Catheter (cm) 0 cm 03/31/23 1752  Site Assessment Clean, Dry, Intact 03/31/23 1752  Lumen #1 Status Flushed;Saline locked;Blood return noted 03/31/23 1752  Lumen #2 Status Flushed;Saline locked;Blood return noted 03/31/23 1752  Dressing Type Transparent;Securing device 03/31/23 1752  Dressing Status Antimicrobial disc in place;Clean, Dry, Intact 03/31/23 1752  Safety Lock Not Applicable 03/31/23 1752  Line Care Connections checked and tightened 03/31/23 1752  Line Adjustment (NICU/IV Team Only) No 03/31/23 1752  Dressing Intervention New dressing 03/31/23 1752  Dressing Change Due 04/07/23 03/31/23 1752       Rodrigo Mcgranahan, Lajean Manes 03/31/2023, 5:53 PM

## 2023-03-31 NOTE — Consult Note (Cosign Needed Addendum)
Consultation Note Date: 03/31/2023   Patient Name: Richard Carter  DOB: [birth year]  MRN: [id number]  Age / Sex: 82 y.o., male  PCP: Marisue Ivan, MD Referring Physician: Gillis Santa, MD  Reason for Consultation: Establishing goals of care  HPI/Patient Profile: ELSON TWEEDLE is a 82 y.o. male with history of recurrent stroke on Eliquis, dementia who comes in with sudden onset of difficulties with speech.  According to triage patient had slurred speech since 12:30 PM on 03/27/2023.  This was when patient was working with speech therapy and they noticed sudden onset of worsening speech.   Clinical Assessment and Goals of Care: Notes and labs reviewed.  In to see patient.  No family at bedside.  Patient has great difficulty with attempting to speak to me this morning.  He does state he has 2 children, and lives at home at baseline.  He states he feels he is doing pretty well with his dysphagia diet.  He returns to watching TV.  H POA documents scanned into ACP tab.  Called his son [name] who is H POA.  [name] states at baseline, his father requires 24/7 assistance.  He states he helps his father with cooking and cleaning.  He states his father's dementia is "not that bad".  He states his father is usually conversive and able to hold a conversation.  He states he sleeps more on rainy days.  He states that in regards to his father, "if he does not want to do it "he will not do it".  He states if he has a UTI, his confusion is worse.  We discussed his diagnoses, prognosis, GOC, EOL wishes disposition and options.  Created space and opportunity for patient  to explore thoughts and feelings regarding current medical information.   A detailed discussion was had today regarding advanced directives.  Concepts specific to code status, artifical feeding and hydration, IV antibiotics and rehospitalization were discussed.   The difference between an aggressive medical intervention path and a comfort care path was discussed.  Values and goals of care important to patient and family were attempted to be elicited.  Discussed limitations of medical interventions to prolong quality of life in some situations and discussed the concept of human mortality.  Son has been well updated, and he discusses plans for endarterectomy on Wednesday.  He states he is hopeful that his father's status will improve.   Son confirms DNR/DNI status.  He confirms that his father would not want to have a feeding tube placed.  Discussed time for outcomes and seeing how he does.  SUMMARY OF RECOMMENDATIONS   DNR/DNI. No feeding tube.  Currently tolerating dysphagia/nectar thick diet.   Prognosis:  Unable to determine      Primary Diagnoses: Present on Admission:  Stroke (HCC)  AF (paroxysmal atrial fibrillation) (HCC)  HTN (hypertension)  Dementia (HCC)  HLD (hyperlipidemia)  Stenosis of left carotid artery   I have reviewed the medical record, interviewed the patient and family, and examined the patient. The following  aspects are pertinent.  Past Medical History:  Diagnosis Date   Alcohol abuse    Dementia (HCC)    GERD (gastroesophageal reflux disease)    HLD (hyperlipidemia)    HTN (hypertension)    Seizures (HCC)    Stroke [facility])    Social History   Socioeconomic History   Marital status: Widowed    Spouse name: Not on file   Number of children: Not on file   Years of education: Not on file   Highest education level: Not on file  Occupational History   Not on file  Tobacco Use   Smoking status: Former    Packs/day: 0.50    Years: 68.00    Additional pack years: 0.00    Total pack years: 34.00    Types: Cigarettes    Quit date: 2023    Years since quitting: 1.3   Smokeless tobacco: Never  Vaping Use   Vaping Use: Never used  Substance and Sexual Activity   Alcohol use: Not Currently    Comment:  Drinks about 4oz of liquor daily. Last drink was yesterday 04/19/22.   Drug use: Never   Sexual activity: Not Currently  Other Topics Concern   Not on file  Social History Narrative   Not on file   Social Determinants of Health   Financial Resource Strain: Not on file  Food Insecurity: No Food Insecurity (02/16/2023)   Hunger Vital Sign    Worried About Running Out of Food in the Last Year: Never true    Ran Out of Food in the Last Year: Never true  Transportation Needs: No Transportation Needs (02/16/2023)   PRAPARE - Administrator, Civil Service (Medical): No    Lack of Transportation (Non-Medical): No  Physical Activity: Not on file  Stress: Not on file  Social Connections: Not on file   Family History  Problem Relation Age of Onset   Stroke Mother    Cancer Brother    Scheduled Meds:  pantoprazole (PROTONIX) IV  40 mg Intravenous Q24H   Continuous Infusions:  heparin 600 Units/hr (03/31/23 0714)   levETIRAcetam 500 mg (03/31/23 1010)   PRN Meds:.acetaminophen **OR** acetaminophen (TYLENOL) oral liquid 160 mg/5 mL **OR** acetaminophen, food thickener, hydrALAZINE, LORazepam, ondansetron (ZOFRAN) IV Medications Prior to Admission:  Prior to Admission medications   Medication Sig Start Date End Date Taking? Authorizing Provider  amLODipine (NORVASC) 5 MG tablet Take 1 tablet (5 mg total) by mouth daily. 05/12/21  Yes Sreenath, Jonelle Sports, MD  apixaban (ELIQUIS) 5 MG TABS tablet Take 1 tablet (5 mg total) by mouth 2 (two) times daily. 05/29/22  Yes Lurene Shadow, MD  atorvastatin (LIPITOR) 20 MG tablet Take 20 mg by mouth every evening.   Yes [provider]  levETIRAcetam (KEPPRA) 500 MG tablet Take 1 tablet (500 mg total) by mouth 2 (two) times daily. 12/01/20  Yes Danford, Earl Lites, MD  losartan (COZAAR) 50 MG tablet Take 50 mg by mouth daily. 02/25/22  Yes [provider]  metoprolol succinate (TOPROL-XL) 25 MG 24 hr tablet Take 0.5 tablets  (12.5 mg total) by mouth daily. 04/30/22  Yes Swayze, Ava, DO  pantoprazole (PROTONIX) 40 MG tablet Take 1 tablet (40 mg total) by mouth 2 (two) times daily. After sixty days the patient is to continue protonix at 40 mg daily. Patient taking differently: Take 40 mg by mouth daily. 04/29/22  Yes Swayze, Ava, DO  traZODone (DESYREL) 100 MG tablet Take 1 tablet (100  mg total) by mouth at bedtime. 05/11/21  Yes Sreenath, Sudheer B, MD  food thickener (SIMPLYTHICK, NECTAR/LEVEL 2/MILDLY THICK,) GEL Take 1 packet by mouth as needed. 04/29/22   Swayze, Ava, DO   No Known Allergies Review of Systems  All other systems reviewed and are negative.   Physical Exam Pulmonary:     Effort: Pulmonary effort is normal.  Neurological:     Mental Status: He is alert.     Vital Signs: BP (!) 155/60 (BP Location: Right Arm)   Pulse 74   Temp 97.9 F (36.6 C) (Oral)   Resp 16   Ht 5' 7.01" (1.702 m)   Wt 62.1 kg   SpO2 100%   BMI 21.44 kg/m  Pain Scale: 0-10   Pain Score: 0-No pain   SpO2: SpO2: 100 % O2 Device:SpO2: 100 % O2 Flow Rate: .   IO: Intake/output summary:  Intake/Output Summary (Last 24 hours) at 03/31/2023 1205 Last data filed at 03/31/2023 0900 Gross per 24 hour  Intake 3150.64 ml  Output --  Net 3150.64 ml    LBM: Last BM Date : 03/29/23 Baseline Weight: Weight: 62.1 kg Most recent weight: Weight: 62.1 kg      Signed by: Morton Stall, NP   Please contact Palliative Medicine Team phone at [phone] for questions and concerns.  For individual provider: See Loretha Stapler

## 2023-03-31 NOTE — Consult Note (Signed)
ANTICOAGULATION CONSULT NOTE  Pharmacy Consult for heparin Indication: atrial fibrillation ISO recent stroke  No Known Allergies  Patient Measurements: Height: 5' 7.01" (170.2 cm) Weight: 62.1 kg (136 lb 14.5 oz) IBW/kg (Calculated) : 66.12 Heparin Dosing Weight: 62.1  Vital Signs: Temp: 99.1 F (37.3 C) (05/06 0353) Temp Source: Oral (05/05 2003) BP: 144/64 (05/06 0353) Pulse Rate: 58 (05/06 0353)  Labs: Recent Labs    03/28/23 1650 03/29/23 0031 03/29/23 0428 03/29/23 1022 03/30/23 0414 03/30/23 1259 03/31/23 0535  HGB  --    < > 8.2*  --  7.8*  --  7.8*  HCT  --   --  26.8*  --  25.0*  --  25.3*  PLT  --   --  301  --  305  --  260  APTT 41*   < >  --    < > 69* 79* 58*  LABPROT 17.6*  --   --   --   --   --   --   INR 1.5*  --   --   --   --   --   --   HEPARINUNFRC >1.10*  --  >1.10*  --  >1.10*  --  1.09*  CREATININE  --   --  1.05  --  1.05  --   --    < > = values in this interval not displayed.     Estimated Creatinine Clearance: 46.8 mL/min (by C-G formula based on SCr of 1.05 mg/dL).   Medical History: Past Medical History:  Diagnosis Date   Alcohol abuse    Dementia (HCC)    GERD (gastroesophageal reflux disease)    HLD (hyperlipidemia)    HTN (hypertension)    Seizures (HCC)    Stroke (HCC)     Medications:  Apixaban 5 mg PO twice daily (last dose 5/2 @ 1610)  Assessment: 82 y.o. male with PMH including stroke, CAD, Afib on Eliquis, HTN, HLD, anemia who presents with slurred speech/CVA. Onset of symptoms on 5/2 @ 1230. Also initially had right arm weakness which has now resolved. Pt is scheduled for open left CEA on 5/7 and pharmacy has been consulted to manage peri-operative anticoagulation with heparin. Provider requested to omit bolus ISO recent stroke and communicated that plan is to stop the heparin 6 hrs before CEA planned 5/7.   Date Time aPTT/HL Rate/Comment 0503 1724 41/>1.10 Baseline 0504 0031 aPTT  136 Supratherapeutic 0504 1022 aPTT 136 Supratherapeutic 0504 1949 aPTT 65 Slightly below goal range (500un/hr > 550un/hr) 0505 0414 aPTT 69 Therapeutic x 1, HL not correlating 0505 1259 aPTT 79 Therapeutic x 2 0506 0535 aPTT 58 Subtherapeutic, HL not correlating   Goal of Therapy:  Heparin level 0.3-0.5 units/ml once heparin level and aPTT correlate.  aPTT 66-85 seconds Monitor platelets by anticoagulation protocol: Yes    Plan:  Will continue heparin infusion at 600 units/hr. Recheck aPTT in 8 hrs after rate change Recheck CBC and heparin level with AM labs. Switch to heparin level monitoring once aPTT and heparin level correlate.   Otelia Sergeant, PharmD, [facility] 03/31/2023 6:51 AM

## 2023-03-31 NOTE — Progress Notes (Signed)
Progress Note    03/31/2023 12:18 PM * No surgery date entered *  Subjective:   This is a 82 y.o. male  Richard Carter is a 82 y.o. 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 Patient was recently hospitalized from 3/24-3/29 due to stroke   patient was noted to have slurred speech and right arm weakness. His right arm weakness has resolved. He still has persistent slurred speech. No facial droop. At her normal baseline, patient is oriented to the place and person, but not to the time. His mental status seems to be close to baseline. He moves all extremities with symmetric weakness in all extremities. Has chronic left middle finger contracture. Denies chest pain, cough, shortness of breath. No nausea, vomiting, diarrhea or abdominal pain.  Vitals:   03/31/23 0848 03/31/23 1100  BP: (!) 155/60   Pulse: 74   Resp: 16 16  Temp: 97.9 F (36.6 C)   SpO2: 100%    Physical Exam: Cardiac:  Atrial Fibrillation, without  Murmurs, rubs or gallops; with carotid bruits  Lungs:  normal non-labored breathing, without Rales, rhonchi, wheezing  Incisions:  NONE Extremities:  without ischemic changes, without Gangrene , without cellulitis; without open wounds;  Abdomen:  Positive Bowel Sounds,  soft, NT/ND, no masses  Neurologic:  A&O X 3;  No focal weakness or paresthesias are detected; speech is slightly slurred with a history of right sided facial droop.   CBC    Component Value Date/Time   WBC 7.6 03/31/2023 0535   RBC 3.08 (L) 03/31/2023 0535   HGB 7.8 (L) 03/31/2023 0535   HGB 12.9 (L) 10/25/2014 1048   HCT 25.3 (L) 03/31/2023 0535   HCT 38.6 (L) 10/25/2014 1048   PLT 260 03/31/2023 0535   PLT 93 (L) 10/25/2014 1048   MCV 82.1 03/31/2023 0535   MCV 108 (H) 10/25/2014 1048    MCH 25.3 (L) 03/31/2023 0535   MCHC 30.8 03/31/2023 0535   RDW 16.6 (H) 03/31/2023 0535   RDW 19.6 (H) 10/25/2014 1048   LYMPHSABS 1.4 03/27/2023 1409   LYMPHSABS 0.8 (L) 10/25/2014 1048   MONOABS 0.8 03/27/2023 1409   MONOABS 0.9 10/25/2014 1048   EOSABS 0.4 03/27/2023 1409   EOSABS 0.0 10/25/2014 1048   BASOSABS 0.1 03/27/2023 1409   BASOSABS 0.0 10/25/2014 1048    BMET    Component Value Date/Time   NA 139 03/30/2023 0414   NA 133 (L) 10/25/2014 1048   K 3.7 03/30/2023 0414   K 3.3 (L) 10/25/2014 1048   CL 113 (H) 03/30/2023 0414   CL 92 (L) 10/25/2014 1048   CO2 20 (L) 03/30/2023 0414   CO2 30 10/25/2014 1048   GLUCOSE 74 03/30/2023 0414   GLUCOSE 125 (H) 10/25/2014 1048   BUN 19 03/30/2023 0414   BUN 18 10/25/2014 1048   CREATININE 1.05 03/30/2023 0414   CREATININE 1.02 10/25/2014 1048   CALCIUM 9.5 03/30/2023 0414   CALCIUM 8.9 10/25/2014 1048   GFRNONAA >60 03/30/2023 0414   GFRNONAA >60 10/25/2014 1048   GFRNONAA >60 06/09/2014 0431   GFRAA >60 02/24/2020 0401   GFRAA >60 10/25/2014 1048   GFRAA >60 06/09/2014 0431    INR    Component Value Date/Time   INR 1.5 (H) 03/28/2023 1650     Intake/Output Summary (Last 24  hours) at 03/31/2023 1218 Last data filed at 03/31/2023 0900 Gross per 24 hour  Intake 3150.64 ml  Output --  Net 3150.64 ml     Assessment/Plan:  82 y.o. male who presents to [facility] emergency department with symptoms of CVA.  Patient presented with slurred speech and right-sided weakness.  Morning his right-sided weakness has resolved.  Patient has had a history of right-sided facial droop so that persisted this morning and patient appears to have what may be his baseline normal speech appearing slightly slurred  * No surgery date entered *   PLAN: Vascular surgery will schedule the patient for an open left carotid endarterectomy next Wednesday, 04/02/2023.  Patient was seen outpatient with Dr. Vilinda Flake on 03/17/2023 and patient had  agreed with his caregiver to proceed with surgery.  I discussed the procedure in detail with the patient this morning.  He verbalizes understanding and agrees to proceed with the procedure.  I will also follow-up with the patient's caregiver which I believe is Renaldo Reel at [phone]) and inform him of the plan.     -I discussed the plan in detail with Dr. Festus Barren MD and he agrees with the plan.   Marcie Bal [facility] 03/31/2023 12:18 PM

## 2023-03-31 NOTE — Progress Notes (Signed)
[facility] Progress Note  Patient: Richard Carter    ZOX:[id number]  DOA: 03/27/2023     Date of Service: the patient was seen and examined on 03/31/2023  Chief Complaint  Patient presents with   Aphasia   Brief hospital course: Richard Carter is a 82 y.o. 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  Found to have recurrent stroke.  Neurology and vascular surgery involved.  Plan for left CEA on 5/7.  Currently on IV heparin.     Assessment and Plan:  Acute ischemic stroke MRI-brain showed acute infarct in the postcentral gyrus on the left. Neurology following. Carotid Dopplers unchanged from prior stenosis. Currently allowing permissive hypertension. Patient was on Eliquis.  switched to heparin.  Cholesterol medications restarted as diet has been upgraded. PT OT were also consulted. Monitor on telemetry. Recently had an echocardiogram with preserved EF without any wall motion abnormality. Palliative care has been consulted.    Dysphagia The patient was evaluated by SLP yesterday. The patient was deemed to be at high risk for aspiration. For that reason he was made NPO on 03/28/2023. SLP has re-evaluated the patient as he was somewhat lethargic on 03/27/2023.  Diet was upgraded to DYS-1 with nectar thick liquids    Stenosis of left carotid artery:  pt has high-grade left carotid arteries stenosis -consulted Dr. Wyn Quaker of [facility] Scheduled for outpatient procedure.  Currently scheduled for left CEA on 5/7. Continue IV heparin.    paroxysmal atrial fibrillation Heart rate 58, 120 Currently rate controlled. Continuing metoprolol. Eliquis on hold and switch to heparin due to plans for CEA.   Seizures (HCC) -Seizure precaution -As needed Ativan for  seizure -Continue home Keppra, now IV.   HTN (hypertension): Allowing permissive hypertension. -hold amlodipine, Cozaar -Continue metoprolol which is for rate control for atrial fibrillation   HLD: - Continue his home atorvastatin   Dementia (HCC): No behavior disturbance -Fall precaution  Iron deficiency anemia, transferrin saturation 6% Started ferric gluconate to 50 mg IV daily for 4 doses, followed by oral supplement with vitamin C. Folic acid level 8.7, within normal range Follow B12 level Monitor H&H and transfuse if hemoglobin less than 7   Body mass index is 21.44 kg/m.  Interventions:        Diet: Dysphagia 1 diet with nectar thick liquids DVT Prophylaxis: Therapeutic Anticoagulation with heparin IV infusion    Advance goals of care discussion: DNR  Family Communication: family was not present at bedside, at the time of interview.  The pt provided permission to discuss medical plan with the family. Opportunity was given to ask question and all questions were answered satisfactorily.   Disposition:  Pt is from Home, admitted with stroke and found to have left carotid stenosis, needs CEA by vascular surgery plan is on 5/7, still on Hep gtt, which precludes a safe discharge. Discharge to home [facility] vs SNF TBD after PT/OT eval, when stable.  Subjective: No significant events overnight, patient was resting comfortably.  Patient is AO x 2, denies any complaints.  Physical Exam: General: NAD, lying comfortably Appear in no distress, affect appropriate Eyes: PERRLA ENT: Oral Mucosa Clear, moist  Neck: no JVD,  Cardiovascular: S1 and S2 Present, no Murmur,  Respiratory: good respiratory effort, Bilateral Air entry equal and Decreased, no Crackles, no wheezes Abdomen: Bowel Sound  present, Soft and no tenderness,  Skin: no rashes Extremities: no Pedal edema, no calf tenderness Neurologic: Residual weakness on the right side due to prior stroke. Gait not checked due to  patient safety concerns  Vitals:   03/30/23 2337 03/31/23 0353 03/31/23 0848 03/31/23 1100  BP: (!) 140/63 (!) 144/64 (!) 155/60 (!) 187/87  Pulse: 71 (!) 58 74 82  Resp: 14 16 16 16   Temp: 98 F (36.7 C) 99.1 F (37.3 C) 97.9 F (36.6 C) 98 F (36.7 C)  TempSrc:   Oral Oral  SpO2: 100% 99% 100% 100%  Weight:      Height:        Intake/Output Summary (Last 24 hours) at 03/31/2023 1400 Last data filed at 03/31/2023 0900 Gross per 24 hour  Intake 3150.64 ml  Output --  Net 3150.64 ml   Filed Weights   03/28/23 1600  Weight: 62.1 kg    Data Reviewed: I have personally reviewed and interpreted daily labs, tele strips, imagings as discussed above. I reviewed all nursing notes, pharmacy notes, vitals, pertinent old records I have discussed plan of care as described above with RN and patient/family.  CBC: Recent Labs  Lab 03/27/23 1409 03/28/23 0547 03/29/23 0428 03/30/23 0414 03/31/23 0535  WBC 8.9 7.1 7.5 6.6 7.6  NEUTROABS 6.2  --   --   --   --   HGB 8.3* 7.9* 8.2* 7.8* 7.8*  HCT 28.0* 25.2* 26.8* 25.0* 25.3*  MCV 84.6 81.6 83.0 82.0 82.1  PLT 297 275 301 305 260   Basic Metabolic Panel: Recent Labs  Lab 03/27/23 1409 03/28/23 0547 03/29/23 0428 03/30/23 0414  NA 138 139 139 139  K 3.7 3.3* 3.9 3.7  CL 112* 111 114* 113*  CO2 18* 21* 19* 20*  GLUCOSE 104* 89 82 74  BUN 18 18 20 19   CREATININE 1.20 1.08 1.05 1.05  CALCIUM 9.3 9.2 9.8 9.5  MG  --   --  1.7  --     Studies: No results found.  Scheduled Meds:  pantoprazole (PROTONIX) IV  40 mg Intravenous Q24H   Continuous Infusions:  heparin 600 Units/hr (03/31/23 0714)   levETIRAcetam 500 mg (03/31/23 1010)   PRN Meds: acetaminophen **OR** acetaminophen (TYLENOL) oral liquid 160 mg/5 mL **OR** acetaminophen, food thickener, hydrALAZINE, LORazepam, ondansetron (ZOFRAN) IV  Time spent: 35 minutes  Author: Gillis Santa. MD [facility] 03/31/2023 2:00 PM  To reach On-call, see care teams to  locate the attending and reach out to them via [email or web address]. If 7PM-7AM, please contact night-coverage If you still have difficulty reaching the attending provider, please page the [facility] (Director on Call) for [facility] on amion for assistance.

## 2023-03-31 NOTE — Consult Note (Addendum)
ANTICOAGULATION CONSULT NOTE  Pharmacy Consult for heparin Indication: atrial fibrillation ISO recent stroke  No Known Allergies  Patient Measurements: Height: 5' 7.01" (170.2 cm) Weight: 62.1 kg (136 lb 14.5 oz) IBW/kg (Calculated) : 66.12 Heparin Dosing Weight: 62.1  Vital Signs: Temp: 98 F (36.7 C) (05/06 1100) Temp Source: Oral (05/06 1100) BP: 187/87 (05/06 1100) Pulse Rate: 82 (05/06 1100)  Labs: Recent Labs    03/29/23 0428 03/29/23 1022 03/30/23 0414 03/30/23 1259 03/31/23 0535 03/31/23 1547  HGB 8.2*  --  7.8*  --  7.8*  --   HCT 26.8*  --  25.0*  --  25.3*  --   PLT 301  --  305  --  260  --   APTT  --    < > 69* 79* 58* 36  HEPARINUNFRC >1.10*  --  >1.10*  --  1.09*  --   CREATININE 1.05  --  1.05  --   --   --    < > = values in this interval not displayed.     Estimated Creatinine Clearance: 46.8 mL/min (by C-G formula based on SCr of 1.05 mg/dL).   Medical History: Past Medical History:  Diagnosis Date   Alcohol abuse    Dementia (HCC)    GERD (gastroesophageal reflux disease)    HLD (hyperlipidemia)    HTN (hypertension)    Seizures (HCC)    Stroke (HCC)     Medications:  Apixaban 5 mg PO twice daily (last dose 5/2 @ 1027)  Assessment: 82 y.o. male with PMH including stroke, CAD, Afib on Eliquis, HTN, HLD, anemia who presents with slurred speech/CVA. Onset of symptoms on 5/2 @ 1230. Also initially had right arm weakness which has now resolved. Pt is scheduled for open left CEA on 5/7 and pharmacy has been consulted to manage peri-operative anticoagulation with heparin. Provider requested to omit bolus ISO recent stroke and communicated that plan is to stop the heparin 6 hrs before CEA planned 5/7.   Date Time aPTT/HL Rate/Comment 0503 1724 41/>1.10 Baseline 0504 0031 aPTT 136 Supratherapeutic 0504 1022 aPTT 136 Supratherapeutic 0504 1949 aPTT 65 Slightly below goal range (500un/hr > 550un/hr) 0505 0414 aPTT 69 Therapeutic x 1, HL not  correlating 0505 1259 aPTT 79 Therapeutic x 2 0506 0535 aPTT 58 Subtherapeutic, HL not correlating 0506 1547 aPTT 36 Subtherapeutic  Goal of Therapy:  Heparin level 0.3-0.5 units/ml once heparin level and aPTT correlate.  aPTT 66-85 seconds Monitor platelets by anticoagulation protocol: Yes    Plan:  Patient lost IV access for unknown amount of time, heparin infusion restarted@1835  at previous rate of 600 units/hr Recheck aPTT in 8 hrs after restart Recheck CBC and heparin level with AM labs. Switch to heparin level monitoring once aPTT and heparin level correlate.  Bettey Costa, PharmD Clinical Pharmacist 03/31/2023 7:32 PM

## 2023-04-01 ENCOUNTER — Inpatient Hospital Stay: Payer: Medicare HMO

## 2023-04-01 DIAGNOSIS — I6359 Cerebral infarction due to unspecified occlusion or stenosis of other cerebral artery: Secondary | ICD-10-CM | POA: Diagnosis not present

## 2023-04-01 LAB — CBC
HCT: 22.9 % — ABNORMAL LOW (ref 39.0–52.0)
Hemoglobin: 7.2 g/dL — ABNORMAL LOW (ref 13.0–17.0)
MCH: 25.5 pg — ABNORMAL LOW (ref 26.0–34.0)
MCHC: 31.4 g/dL (ref 30.0–36.0)
MCV: 81.2 fL (ref 80.0–100.0)
Platelets: 231 10*3/uL (ref 150–400)
RBC: 2.82 MIL/uL — ABNORMAL LOW (ref 4.22–5.81)
RDW: 16.7 % — ABNORMAL HIGH (ref 11.5–15.5)
WBC: 10.1 10*3/uL (ref 4.0–10.5)
nRBC: 0 % (ref 0.0–0.2)

## 2023-04-01 LAB — BASIC METABOLIC PANEL
Anion gap: 8 (ref 5–15)
BUN: 34 mg/dL — ABNORMAL HIGH (ref 8–23)
CO2: 21 mmol/L — ABNORMAL LOW (ref 22–32)
Calcium: 9.4 mg/dL (ref 8.9–10.3)
Chloride: 110 mmol/L (ref 98–111)
Creatinine, Ser: 1.38 mg/dL — ABNORMAL HIGH (ref 0.61–1.24)
GFR, Estimated: 51 mL/min — ABNORMAL LOW (ref >60)
Glucose, Bld: 120 mg/dL — ABNORMAL HIGH (ref 70–99)
Potassium: 4.2 mmol/L (ref 3.5–5.1)
Sodium: 139 mmol/L (ref 135–145)

## 2023-04-01 LAB — APTT
aPTT: 146 seconds — ABNORMAL HIGH (ref 24–36)
aPTT: 149 seconds — ABNORMAL HIGH (ref 24–36)
aPTT: 76 seconds — ABNORMAL HIGH (ref 24–36)
aPTT: 125 seconds — ABNORMAL HIGH (ref 24–36)

## 2023-04-01 LAB — PHOSPHORUS: Phosphorus: 3.6 mg/dL (ref 2.5–4.6)

## 2023-04-01 LAB — MAGNESIUM: Magnesium: 1.6 mg/dL — ABNORMAL LOW (ref 1.7–2.4)

## 2023-04-01 LAB — HEPARIN LEVEL (UNFRACTIONATED): Heparin Unfractionated: 0.66 IU/mL (ref 0.30–0.70)

## 2023-04-01 MED ORDER — LEVETIRACETAM IN NACL 500 MG/100ML IV SOLN
500.0000 mg | Freq: Two times a day (BID) | INTRAVENOUS | Status: DC
  Administered 2023-04-02 – 2023-04-06 (×6): 500 mg via INTRAVENOUS
  Filled 2023-04-01 (×11): qty 100

## 2023-04-01 MED ORDER — HEPARIN (PORCINE) 25000 UT/250ML-% IV SOLN
400.0000 [IU]/h | INTRAVENOUS | Status: DC
  Administered 2023-04-01: 400 [IU]/h via INTRAVENOUS

## 2023-04-01 MED ORDER — VITAMIN B-12 1000 MCG PO TABS: 1000.0000 ug | ORAL_TABLET | Freq: Every day | ORAL | Status: DC

## 2023-04-01 MED ORDER — CHLORHEXIDINE GLUCONATE 4 % EX SOLN
60.0000 mL | Freq: Once | CUTANEOUS | Status: AC
  Administered 2023-04-02: 4 via TOPICAL

## 2023-04-01 MED ORDER — ATORVASTATIN CALCIUM 20 MG PO TABS
20.0000 mg | ORAL_TABLET | Freq: Every evening | ORAL | Status: DC
  Administered 2023-04-01 – 2023-04-06 (×6): 20 mg via ORAL
  Filled 2023-04-01 (×6): qty 1

## 2023-04-01 MED ORDER — LEVETIRACETAM 500 MG PO TABS
500.0000 mg | ORAL_TABLET | Freq: Two times a day (BID) | ORAL | Status: DC
  Administered 2023-04-01 – 2023-04-07 (×5): 500 mg via ORAL
  Filled 2023-04-01 (×9): qty 1

## 2023-04-01 MED ORDER — CYANOCOBALAMIN 1000 MCG/ML IJ SOLN
1000.0000 ug | Freq: Every day | INTRAMUSCULAR | Status: DC
  Administered 2023-04-01 – 2023-04-07 (×6): 1000 ug via INTRAMUSCULAR
  Filled 2023-04-01 (×7): qty 1

## 2023-04-01 MED ORDER — HEPARIN (PORCINE) 25000 UT/250ML-% IV SOLN
250.0000 [IU]/h | INTRAVENOUS | Status: DC
  Administered 2023-04-01: 250 [IU]/h via INTRAVENOUS

## 2023-04-01 MED ORDER — HEPARIN (PORCINE) 25000 UT/250ML-% IV SOLN: 400.0000 [IU]/h | INTRAVENOUS | Status: DC

## 2023-04-01 MED ORDER — MAGNESIUM SULFATE 2 GM/50ML IV SOLN
2.0000 g | Freq: Once | INTRAVENOUS | Status: AC
  Administered 2023-04-01: 2 g via INTRAVENOUS
  Filled 2023-04-01: qty 50

## 2023-04-01 MED ORDER — SODIUM CHLORIDE 0.9 % IV SOLN: INTRAVENOUS | Status: AC

## 2023-04-01 NOTE — Progress Notes (Addendum)
Daily Progress Note   Patient Name: Richard Carter       Date: 04/01/2023 DOB: 1941-11-01  Age: 82 y.o. MRN#: [id number] Attending Physician: Gillis Santa, MD Primary Care Physician: Marisue Ivan, MD Admit Date: 03/27/2023  Reason for Consultation/Follow-up: Establishing goals of care  Subjective: Notes and labs reviewed.  Spoke with SLP regarding swallow eval.  She advises patient will continue a dysphagia diet with nectar thick liquids.  She advises that this is the same diet the patient was on around a year ago.  Patient is currently working with staff and using a walker at this time.  Length of Stay: 5  Current Medications: Scheduled Meds:   amLODipine  5 mg Oral Daily   [START ON 04/04/2023] ascorbic acid  500 mg Oral Daily   Chlorhexidine Gluconate Cloth  6 each Topical Daily   cyanocobalamin  1,000 mcg Intramuscular Daily   Followed by   Melene Muller ON 04/08/2023] vitamin B-12  1,000 mcg Oral Daily   [START ON 04/04/2023] iron polysaccharides  150 mg Oral Daily   levETIRAcetam  500 mg Oral Q12H   pantoprazole (PROTONIX) IV  40 mg Intravenous Q24H   sodium chloride flush  10-40 mL Intracatheter Q12H    Continuous Infusions:  sodium chloride 75 mL/hr at 04/01/23 0956   ferric gluconate (FERRLECIT) IVPB 250 mg (03/31/23 1931)   heparin 400 Units/hr (04/01/23 0531)   levETIRAcetam      PRN Meds: acetaminophen **OR** acetaminophen (TYLENOL) oral liquid 160 mg/5 mL **OR** acetaminophen, food thickener, hydrALAZINE, LORazepam, ondansetron (ZOFRAN) IV, sodium chloride flush  Physical Exam Constitutional:      Comments: No distress noted  Pulmonary:     Effort: Pulmonary effort is normal.  Neurological:     Mental Status: He is alert.             Vital Signs: BP 122/77  (BP Location: Right Arm)   Pulse 71   Temp 98 F (36.7 C) (Oral)   Resp 18   Ht 5' 7.01" (1.702 m)   Wt 62.1 kg   SpO2 98%   BMI 21.44 kg/m  SpO2: SpO2: 98 % O2 Device: O2 Device: Room Air O2 Flow Rate:    Intake/output summary:  Intake/Output Summary (Last 24 hours) at 04/01/2023 1123 Last data filed at 04/01/2023 0409 Gross per  24 hour  Intake --  Output 950 ml  Net -950 ml   LBM: Last BM Date : 03/31/23 Baseline Weight: Weight: 62.1 kg Most recent weight: Weight: 62.1 kg         Patient Active Problem List   Diagnosis Date Noted   Stroke (HCC) 03/27/2023   Stenosis of left carotid artery 02/20/2023   Altered mental status 02/20/2023   Acute stroke due to ischemia (HCC) 02/17/2023   Stroke-like symptom 02/16/2023   Leukocytosis 02/16/2023   Protein-calorie malnutrition, severe 05/28/2022   Dysphagia due to recent cerebrovascular accident 05/28/2022   Acute metabolic encephalopathy 05/27/2022   History of stroke 05/27/2022   Aspiration pneumonia (HCC) 04/23/2022   Nicotine dependence    Elevated troponin    Acute CVA (cerebrovascular accident) (HCC) 05/07/2021   Non compliance w medication regimen 05/07/2021   Dementia (HCC) 05/07/2021   AF (paroxysmal atrial fibrillation) (HCC) 05/07/2021   HTN (hypertension) 11/29/2020   Alcohol abuse 11/29/2020   AKI (acute kidney injury) (HCC) 11/29/2020   Atrial flutter with rapid ventricular response (HCC) 11/29/2020   Seizures (HCC) 02/22/2020   Pubic ramus fracture (HCC) 07/29/2019   Acetabular fracture (HCC) 07/29/2019   HLD (hyperlipidemia) 07/29/2019   GERD (gastroesophageal reflux disease) 07/29/2019   UTI (urinary tract infection) 07/29/2019    Palliative Care Assessment & Plan     Recommendations/Plan: DNR/DNI.  No feeding tube. Continue current care. PMT will continue to follow/shadow for needs.  Code Status:    Code Status Orders  (From admission, onward)           Start     Ordered    03/27/23 1621  Do not attempt resuscitation (DNR)  Continuous       Question Answer Comment  If patient has no pulse and is not breathing Do Not Attempt Resuscitation   If patient has a pulse and/or is breathing: Medical Treatment Goals COMFORT MEASURES: Keep clean/warm/dry, use medication by any route; positioning, wound care and other measures to relieve pain/suffering; use oxygen, suction/manual treatment of airway obstruction for comfort; do not transfer unless for comfort needs.   Consent: Discussion documented in EHR or advanced directives reviewed      03/27/23 1622           Code Status History     Date Active Date Inactive Code Status Order ID Comments User Context   02/16/2023 1616 02/21/2023 1821 DNR [id number]  Lovenia Kim, DO ED   05/27/2022 1224 05/29/2022 1950 DNR [id number]  Pennie Banter, DO ED   04/20/2022 1628 04/29/2022 1938 DNR [id number]  Lucile Shutters, MD ED   04/20/2022 1550 04/20/2022 1628 Full Code [id number]  Lucile Shutters, MD ED   05/08/2021 0856 05/11/2021 2019 DNR [id number]  Tresa Moore, MD Inpatient   05/07/2021 1416 05/08/2021 0855 Full Code [id number]  Lucile Shutters, MD ED   11/29/2020 1614 12/01/2020 2004 DNR [id number]  Kathrynn Running, MD ED   02/22/2020 1753 02/24/2020 2032 DNR [id number]  Tresa Moore, MD ED   02/22/2020 1537 02/22/2020 1753 Full Code [id number]  Tresa Moore, MD ED   07/29/2019 2305 08/04/2019 1654 Full Code [id number]  Oralia Manis, MD ED       Prognosis:  Unable to determine   Care plan was discussed with SLP and nursing  Thank you for allowing the Palliative Medicine Team to assist in the care of this patient.   Morton Stall, NP  Please contact Palliative Medicine Team phone at  [phone] for questions and concerns.

## 2023-04-01 NOTE — Plan of Care (Signed)
  Problem: Education: Goal: Knowledge of disease or condition will improve Outcome: Progressing Goal: Knowledge of secondary prevention will improve (MUST DOCUMENT ALL) Outcome: Progressing Goal: Knowledge of patient specific risk factors will improve Loraine Leriche N/A or DELETE if not current risk factor) Outcome: Progressing   Problem: Ischemic Stroke/TIA Tissue Perfusion: Goal: Complications of ischemic stroke/TIA will be minimized Outcome: Progressing   Problem: Coping: Goal: Will verbalize positive feelings about self Outcome: Progressing Goal: Will identify appropriate support needs Outcome: Progressing   Problem: Health Behavior/Discharge Planning: Goal: Ability to manage health-related needs will improve Outcome: Progressing Goal: Goals will be collaboratively established with patient/family Outcome: Progressing   Problem: Self-Care: Goal: Ability to participate in self-care as condition permits will improve Outcome: Progressing Goal: Verbalization of feelings and concerns over difficulty with self-care will improve Outcome: Progressing Goal: Ability to communicate needs accurately will improve Outcome: Progressing   Problem: Nutrition: Goal: Risk of aspiration will decrease Outcome: Progressing Goal: Dietary intake will improve Outcome: Progressing   Problem: Education: Goal: Knowledge of General Education information will improve Description: Including pain rating scale, medication(s)/side effects and non-pharmacologic comfort measures Outcome: Progressing   Problem: Health Behavior/Discharge Planning: Goal: Ability to manage health-related needs will improve Outcome: Progressing   Problem: Clinical Measurements: Goal: Ability to maintain clinical measurements within normal limits will improve Outcome: Progressing Goal: Will remain free from infection Outcome: Progressing Goal: Diagnostic test results will improve Outcome: Progressing Goal: Respiratory  complications will improve Outcome: Progressing Goal: Cardiovascular complication will be avoided Outcome: Progressing   Problem: Activity: Goal: Risk for activity intolerance will decrease Outcome: Progressing   Problem: Nutrition: Goal: Adequate nutrition will be maintained Outcome: Progressing   Problem: Coping: Goal: Level of anxiety will decrease Outcome: Progressing   Problem: Elimination: Goal: Will not experience complications related to bowel motility Outcome: Progressing Goal: Will not experience complications related to urinary retention Outcome: Progressing   Problem: Pain Managment: Goal: General experience of comfort will improve Outcome: Progressing

## 2023-04-01 NOTE — Progress Notes (Signed)
Heparin gtt stopped for 30 min per pharmacy.

## 2023-04-01 NOTE — Progress Notes (Addendum)
Modified Barium Swallow Study  Patient Details  Name: Richard Carter MRN: [id number] Date of Birth: Oct 29, 1941  Today's Date: 04/01/2023  Modified Barium Swallow completed.  Full report located under Chart Review in the Imaging Section.  History of Present Illness This is a 82 y.o. male  Richard Carter is a 82 y.o. male with medical history significant of Dementia, ETOH abuse, stroke, high-grade left carotid artery stenosis, seizure, A-fib on Eliquis, hypertension, hyperlipidemia, anemia, former smoker, ETOH abuse in remission for more than 3 years, who presents with slurred speech. Patient was recently hospitalized from 3/24-3/29 due to stroke.  Patient was found to have high-grade left carotid artery stenosis.  Dr. Gilda Crease of [facility] is following up with the patient. Patient was seen and in the process of bring scheduled outpatient for an Open Carotid Endarterectomy in the coming weeks. Current MRI revealed Acute infarct in the postcentral gyrus on the left 2. 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    CXR on 03/31/2023: Underinflation.  Chronic changes.  No acute cardiopulmonary disease. Similar to CXR at admit.    Clinical Impression Patient appears to present with what appears to be moderate, chronic oropharyngeal dysphagia with contributing factors of cognitive deficits(Dementia at baseline; ETOH abuse history), generalized weakness, sensorimotor deficits, and structural age-related changes. Aspiration (w/ cough) of thin liquids occurred. Pt is only wearing an Upper Denture plate; missing lower dentition.  Oral phase is characterized by min lingual weakness but functional bolus control/holding, disorganized lingual transport w/ premature bolus loss of liquids into the pharynx, and lingual/lateral sulci/floor of mouth residue post-swallow. Pt requires a CUED f/u, DRY swallow to reduce and/or clear  oral cavity and BOT of residue. Time required for completing the DRY swallow.  Swallow initiation is delayed to the level of the posterior laryngeal surface of epiglottis w/ Nectar/Honey/Puree consistencies; pyriform sinuses with thin liquid trial. Pharyngeal phase is noted for mildly decreased base of tongue retraction, mildly reduced hyolaryngeal excursion, and pharyngeal constriction. This resulted in min, diffuse pharyngeal residue post swallow. Epiglottic inversion is partial but more complete w/ heavier boluses. There is mild valleculae and intermittently pyriform sinus residue, greater with liquid consistencies. Pharyngeal stripping wave is mildly reduce w/ liquids but more complete w/ heavier boluses. No overt laryngeal penetration with Nectar/Honey/Puree consistencies; immediate Aspiration (w/ cough) noted during the swallow w/ thin liquid trial. Subsequent DRY swallow was effective in reducing and/or clearing oropharyngeal residue.  Amplitude/duration of cricopharyngeus opening is WFL. There is adequate/complete clearance through the UES, however, a min prominent CP segment was noted as well as min-appearing osteophyte at ~C5-6 of the cervical esophagus - no overt impact from either on the opening nor clearing of the cervical esophagus during bolus transfer noted during the study. A 13 mm barium tablet was NOT given.  Consistencies tested were thin liquids x1 tsps, nectar x2 tsp, 5 cup sips, honey x1 tsp, pudding x2 tsp.  Reviewed study findings with pt; he will need Supervision during meals for follow through w/ precautions/strategies d/t Baseline Cognitive decline. Recommend Pureed diet with well-moistened foods, Nectar liquids via CUP, Pills Crushed in Puree. Pt will need assistance/cuing for f/u, DRY swallow each bite/sip. Will need a f/u, DRY swallow to reduce residue of each bite/sip. Clear oral cavity prior to taking another bite/sip. Upright posture during and after meals, with oral care prior  to meals to reduce oral bacterial load.  Post study, Education provided to  Team that strategies may reduce but cannot prevent aspiration risk, and that in order to perform compensatory maneuvers, pt will need consistent Supervision and cuing to implement effectively. Medical Team verbalized understanding of risks, strategies, precautions, and diet recommendation.  Recommend f/u w/ Palliative Care team for discussion of GOC w/ pt/Family moving forward d/t the chronic nature of pt's dysphagia. Pt does have a Baseline of GERD; any Esophageal phase Dysmotility can impact swallowing, even Retrograde activity and Phlegm can occur and increase risk for aspiration, coughing during meals. Recommend GI f/u if a concern. Factors that may increase risk of adverse event in presence of aspiration [employer] 2021): Poor general health and/or compromised immunity;Reduced cognitive function;Dependence for feeding and/or oral hygiene (CVA)    Swallow Evaluation Recommendations Recommendations: PO diet PO Diet Recommendation: Dysphagia 1 (Pureed);Mildly thick liquids (Level 2, nectar thick) Liquid Administration via: Cup;No straw Medication Administration: Crushed with puree Supervision: Patient able to self-feed;Staff to assist with self-feeding;Full supervision/cueing for swallowing strategies Swallowing strategies  : Minimize environmental distractions;Slow rate;Small bites/sips;Multiple dry swallows after each bite/sip;Follow solids with liquids Postural changes: Position pt fully upright for meals;Stay upright 30-60 min after meals;Out of bed for meals (REFLUX precs.) Oral care recommendations: Oral care BID (2x/day);Oral care before PO;Staff/trained caregiver to provide oral care (Denture care) Recommended consults: Consider dietitian consultation;Consider Palliative care;Consider GI consultation (if any Esophageal dysmotility is suspected d/t pt's c/o "Phlegm") Caregiver Recommendations: Avoid jello, ice  cream, thin soups, popsicles;Remove water pitcher;Have oral suction available       Jerilynn Som, MS, CCC-SLP Speech Language Pathologist Rehab Services; [facility] [phone] (ascom) Skylyn Slezak 04/01/2023,2:37 PM

## 2023-04-01 NOTE — Progress Notes (Signed)
Patient bed alarm going off. Went in to find patient trying to go to the bathroom on his own. When patient got up he accidentally ripped his 2 PIVs out. However patient is on a Heparin gtt and he bleed a lot. There was blood all over the bed, floor, cabinets, walls, bathroom and trash can. Staff member went in to assist patient and me and others were cleaning up the blood using universal precautions. The patient was cleaned up and MD notified about PIVs. Attempted to get a PIV x3 and veins blew. IV team ordered.

## 2023-04-01 NOTE — Progress Notes (Signed)
Physical Therapy Treatment Patient Details Name: Richard Carter MRN: [id number] DOB: 08-09-1941 Today's Date: 04/01/2023   History of Present Illness Pt is an 82 y.o. male presenting to hospital 03/27/23 with sudden onset of difficulties with speech; R sided weakness also noted but resolved; R sided sensation changes noted as well as facial droop.  Imaging showing acute infarct in the postcentral gyrus on L.  Pt admitted with stroke, stenosis of L carotid artery, a-fib, and htn.  Recent hospitalization 3/24-3/29 for stroke.  PMH includes alcohol abuse, dementia, htn, seizures, stroke (with L sided hemiparesis), a-fib, AKI, pubic ramus and acetabular fx 2020, UTI, chronic L middle finger contracture.  Per notes pt also with h/o R UE weakness.    PT Comments    Pt sitting in recliner upon PT arrival; pt agreeable to therapy but requesting back to bed when finished.  During session pt was CGA with transfers and min assist to ambulate 40 feet with RW use (assist to steady and keep RW closer at times).  Limited distance ambulating d/t pt fatigue/generalized weakness.  Pt assisted back to bed end of session with all needs in reach.  Pt and nursing reports plan for procedure tomorrow.  Anticipate will need new PT consult or continue at transfer order to continue therapy post op.    Recommendations for follow up therapy are one component of a multi-disciplinary discharge planning process, led by the attending physician.  Recommendations may be updated based on patient status, additional functional criteria and insurance authorization.  Follow Up Recommendations  Can patient physically be transported by private vehicle: Yes    Assistance Recommended at Discharge Frequent or constant Supervision/Assistance  Patient can return home with the following A little help with walking and/or transfers;A little help with bathing/dressing/bathroom;Assistance with cooking/housework;Direct supervision/assist for  medications management;Assist for transportation;Help with stairs or ramp for entrance   Equipment Recommendations  Rolling walker (2 wheels);Wheelchair (measurements PT);Wheelchair cushion (measurements PT)    Recommendations for Other Services OT consult     Precautions / Restrictions Precautions Precautions: Fall Precaution Comments: Seizure precautions Restrictions Weight Bearing Restrictions: No     Mobility  Bed Mobility Overal bed mobility: Needs Assistance Bed Mobility: Sit to Supine       Sit to supine: Supervision, HOB elevated   General bed mobility comments: increased time/effort to perform on own    Transfers Overall transfer level: Needs assistance Equipment used: Rolling walker (2 wheels) Transfers: Sit to/from Stand Sit to Stand: Min guard           General transfer comment: mild increased effort to stand; vc's for UE placement    Ambulation/Gait Ambulation/Gait assistance: Min assist Gait Distance (Feet): 40 Feet Assistive device: Rolling walker (2 wheels)   Gait velocity: decreased     General Gait Details: forward flexed posture; pt pushing RW far in front of pt at times requiring cueing and assist to keep RW closer; assist to steady; decreased B LE step length/foot clearance   Stairs             Wheelchair Mobility    Modified Rankin (Stroke Patients Only)       Balance Overall balance assessment: Needs assistance Sitting-balance support: No upper extremity supported, Feet supported Sitting balance-Leahy Scale: Good Sitting balance - Comments: steady sitting reaching within BOS   Standing balance support: Bilateral upper extremity supported, During functional activity, Reliant on assistive device for balance Standing balance-Leahy Scale: Poor Standing balance comment: assist for balance during ambulation  Cognition Arousal/Alertness: Awake/alert Behavior During Therapy: Flat  affect Overall Cognitive Status: History of cognitive impairments - at baseline                                          Exercises      General Comments  Nursing cleared pt for participation in physical therapy.  Pt agreeable to PT session.      Pertinent Vitals/Pain Pain Assessment Pain Assessment: No/denies pain Vitals (HR and O2 on room air) stable and WFL throughout treatment session.    Home Living                          Prior Function            PT Goals (current goals can now be found in the care plan section) Acute Rehab PT Goals Patient Stated Goal: to improve strength and mobility PT Goal Formulation: With patient Time For Goal Achievement: 04/11/23 Potential to Achieve Goals: Good Progress towards PT goals: Progressing toward goals    Frequency    Min 4X/week      PT Plan Current plan remains appropriate    Co-evaluation              AM-PAC PT "6 Clicks" Mobility   Outcome Measure  Help needed turning from your back to your side while in a flat bed without using bedrails?: None Help needed moving from lying on your back to sitting on the side of a flat bed without using bedrails?: A Little Help needed moving to and from a bed to a chair (including a wheelchair)?: A Little Help needed standing up from a chair using your arms (e.g., wheelchair or bedside chair)?: A Little Help needed to walk in hospital room?: A Little Help needed climbing 3-5 steps with a railing? : A Lot 6 Click Score: 18    End of Session Equipment Utilized During Treatment: Gait belt Activity Tolerance: Patient limited by fatigue Patient left: in bed;with call bell/phone within reach;with bed alarm set;Other (comment) (fall mat in place) Nurse Communication: Mobility status;Precautions PT Visit Diagnosis: Unsteadiness on feet (R26.81);Other abnormalities of gait and mobility (R26.89);Muscle weakness (generalized) (M62.81)     Time:  1610-9604 PT Time Calculation (min) (ACUTE ONLY): 24 min  Charges:  $Gait Training: 8-22 mins $Therapeutic Activity: 8-22 mins                     Hendricks Limes, PT 04/01/23, 4:48 PM

## 2023-04-01 NOTE — Progress Notes (Signed)
Patient was working with PT and his HR increased up to 140s. Also patient started to cough  and was spitting up a lot of mucous. Before working with PT patient was eating and was coughing with eating as well. Made MD aware of both.

## 2023-04-01 NOTE — Plan of Care (Signed)

## 2023-04-01 NOTE — Progress Notes (Signed)
Progress Note    04/01/2023 12:37 PM * No surgery date entered *  Subjective:   Subjective:   This is a 82 y.o. male  Richard Carter is a 82 y.o. 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 Patient was recently hospitalized from 3/24-3/29 due to stroke    Patient was noted to have slurred speech and right arm weakness. His right arm weakness has resolved. He still has persistent slurred speech. No facial droop. At his normal baseline, patient is oriented to the place and person, but not to the time. His mental status seems to be close to baseline. He moves all extremities with symmetric weakness in all extremities. Has chronic left middle finger contracture. Denies chest pain, cough, shortness of breath. No nausea, vomiting, diarrhea or abdominal pain. Patient seen by speech pathology and placed on a dysphagia diet due to aspiration risk.   Vitals:   04/01/23 0900 04/01/23 1207  BP: 122/77 128/73  Pulse: 71 81  Resp: 18 18  Temp: 98 F (36.7 C) 98 F (36.7 C)  SpO2: 98% 98%   Physical Exam: Cardiac:   Atrial Fibrillation, without  Murmurs, rubs or gallops; with carotid bruits  Lungs:   normal non-labored breathing, without Rales, rhonchi, wheezing  Incisions:  NONE Extremities:  without ischemic changes, without Gangrene , without cellulitis; without open wounds;  Abdomen:  Positive Bowel Sounds, soft, NT/ND, no masses  Neurologic:   A&O X 3;  No focal weakness or paresthesias are detected; speech is slightly slurred with a history of right sided facial droop.   CBC    Component Value Date/Time   WBC 10.1 04/01/2023 0237   RBC 2.82 (L) 04/01/2023 0237   HGB 7.2 (L) 04/01/2023 0237   HGB 12.9 (L) 10/25/2014 1048   HCT 22.9 (L) 04/01/2023 0237   HCT 38.6 (L)  10/25/2014 1048   PLT 231 04/01/2023 0237   PLT 93 (L) 10/25/2014 1048   MCV 81.2 04/01/2023 0237   MCV 108 (H) 10/25/2014 1048   MCH 25.5 (L) 04/01/2023 0237   MCHC 31.4 04/01/2023 0237   RDW 16.7 (H) 04/01/2023 0237   RDW 19.6 (H) 10/25/2014 1048   LYMPHSABS 1.4 03/27/2023 1409   LYMPHSABS 0.8 (L) 10/25/2014 1048   MONOABS 0.8 03/27/2023 1409   MONOABS 0.9 10/25/2014 1048   EOSABS 0.4 03/27/2023 1409   EOSABS 0.0 10/25/2014 1048   BASOSABS 0.1 03/27/2023 1409   BASOSABS 0.0 10/25/2014 1048    BMET    Component Value Date/Time   NA 139 04/01/2023 0237   NA 133 (L) 10/25/2014 1048   K 4.2 04/01/2023 0237   K 3.3 (L) 10/25/2014 1048   CL 110 04/01/2023 0237   CL 92 (L) 10/25/2014 1048   CO2 21 (L) 04/01/2023 0237   CO2 30 10/25/2014 1048   GLUCOSE 120 (H) 04/01/2023 0237   GLUCOSE 125 (H) 10/25/2014 1048   BUN 34 (H) 04/01/2023 0237   BUN 18 10/25/2014 1048   CREATININE 1.38 (H) 04/01/2023 0237   CREATININE 1.02 10/25/2014 1048   CALCIUM 9.4 04/01/2023 0237   CALCIUM 8.9 10/25/2014 1048   GFRNONAA 51 (L) 04/01/2023 0237   GFRNONAA >60 10/25/2014 1048   GFRNONAA >60 06/09/2014 0431   GFRAA >60 02/24/2020 0401   GFRAA >60 10/25/2014 1048   GFRAA >60  06/09/2014 0431    INR    Component Value Date/Time   INR 1.5 (H) 03/28/2023 1650     Intake/Output Summary (Last 24 hours) at 04/01/2023 1237 Last data filed at 04/01/2023 1200 Gross per 24 hour  Intake 120 ml  Output 1200 ml  Net -1080 ml     Assessment/Plan:  82 y.o. male is s/p CVA with slurred speech and right sided weakness. * No surgery date entered *   PLAN: Heparin on hold to the OR  Continue Dysphagia diet until NPO after midnight.   Vascular surgery will schedule the patient for an open left carotid endarterectomy Wednesday, 04/02/2023.  Patient was seen outpatient with Dr. Vilinda Flake on 03/17/2023 and patient had agreed with his caregiver to proceed with surgery.  I discussed the procedure in detail  with the patient again this morning.  He verbalizes understanding and agrees to proceed with the procedure.  I will also follow-up with the patient's caregiver which I believe is Richard Carter at [phone]) and inform him of the plan.   DVT prophylaxis:  Heparin Infusion   Marcie Bal [facility] 04/01/2023 12:37 PM

## 2023-04-01 NOTE — Progress Notes (Signed)
[facility] Progress Note  Patient: Richard Carter    ZOX:[id number]  DOA: 03/27/2023     Date of Service: the patient was seen and examined on 04/01/2023  Chief Complaint  Patient presents with   Aphasia   Brief hospital course: Richard Carter is a 82 y.o. 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  Found to have recurrent stroke.  Neurology and vascular surgery involved.  Plan for left CEA on 5/7.  Currently on IV heparin.     Assessment and Plan:    Stenosis of left carotid artery:  pt has high-grade left carotid arteries stenosis -consulted Dr. Wyn Quaker, vascular surgery, patient was already following as an outpatient.  Scheduled for open left carotid endarterectomy on Wednesday, 04/02/2023 Continue heparin IV infusion Keep n.p.o. after midnight   Acute ischemic stroke MRI-brain showed acute infarct in the postcentral gyrus on the left. Neurology following. Carotid Dopplers unchanged from prior stenosis. Currently allowing permissive hypertension. Patient was on Eliquis.  switched to heparin.  Cholesterol medications restarted as diet has been upgraded. PT OT were also consulted. Monitor on telemetry. Recently had an echocardiogram with preserved EF without any wall motion abnormality. Palliative care has been consulted.    Dysphagia Patient had multiple evaluation by SLP, recommended dysphagia 1 diet with nectar thick liquids.  Patient needs assistance with feeding, instructions were given to RN by speech therapist.     paroxysmal atrial fibrillation Heart rate well-controlled Eliquis on hold and switch to heparin due to plans for CEA. Discontinued beta-blocker due to bradycardia  Started amlodipine 5 mg p.o. daily   seizures (HCC) -Seizure  precaution -As needed Ativan for seizure -Continue home Keppra, now IV.   HTN (hypertension): Allowed permissive hypertension for 48 hrs -Resumed amlodipine Continue to hold Cozaar Discontinued metoprolol due to bradycardia   HLD: - Continue his home atorvastatin LDL <70   Dementia (HCC): No behavior disturbance -Fall precaution  Iron deficiency anemia, transferrin saturation 6% Started ferric gluconate 250 mg IV daily for 4 doses, followed by oral supplement with vitamin C. Folic acid level 8.7, within normal range Monitor H&H and transfuse if hemoglobin less than 7  Vitamin B12 deficiency, B12 level 165, goal >400, started vitamin B12 1000 mcg IM injection daily during hospital stay, followed by oral supplement.  Follow-up PCP to repeat vitamin B12 level after 3 to 6 months.   Body mass index is 21.44 kg/m.  Interventions:        Diet: Dysphagia 1 diet with nectar thick liquids DVT Prophylaxis: Therapeutic Anticoagulation with heparin IV infusion    Advance goals of care discussion: DNR  Family Communication: family was not present at bedside, at the time of interview.  The pt provided permission to discuss medical plan with the family. Opportunity was given to ask question and all questions were answered satisfactorily.   Disposition:  Pt is from Home, admitted with stroke and found to have left carotid stenosis, needs CEA by vascular surgery plan is on 5/8, still on Hep gtt, which precludes a safe discharge. Discharge to home [facility] vs SNF TBD after PT/OT eval, when stable.  Subjective: No significant events overnight, patient was resting comfortably.  Patient is AO x 2, denies any complaints.  Physical Exam: General: NAD, lying comfortably Appear in no distress, affect appropriate Eyes: PERRLA ENT: Oral  Mucosa Clear, moist  Neck: no JVD,  Cardiovascular: S1 and S2 Present, no Murmur,  Respiratory: good respiratory effort, Bilateral Air entry equal and Decreased,  no Crackles, no wheezes Abdomen: Bowel Sound present, Soft and no tenderness,  Skin: no rashes Extremities: no Pedal edema, no calf tenderness Neurologic: Residual weakness on the right side due to prior stroke. Gait not checked due to patient safety concerns  Vitals:   03/31/23 2349 04/01/23 0409 04/01/23 0900 04/01/23 1207  BP: (!) 156/92 (!) 142/68 122/77 128/73  Pulse: 94 (!) 57 71 81  Resp: 18 18 18 18   Temp: 98.6 F (37 C) 98.1 F (36.7 C) 98 F (36.7 C) 98 F (36.7 C)  TempSrc: Oral Oral Oral   SpO2: 96% 95% 98% 98%  Weight:      Height:        Intake/Output Summary (Last 24 hours) at 04/01/2023 1531 Last data filed at 04/01/2023 1200 Gross per 24 hour  Intake 120 ml  Output 1200 ml  Net -1080 ml   Filed Weights   03/28/23 1600  Weight: 62.1 kg    Data Reviewed: I have personally reviewed and interpreted daily labs, tele strips, imagings as discussed above. I reviewed all nursing notes, pharmacy notes, vitals, pertinent old records I have discussed plan of care as described above with RN and patient/family.  CBC: Recent Labs  Lab 03/27/23 1409 03/28/23 0547 03/29/23 0428 03/30/23 0414 03/31/23 0535 04/01/23 0237  WBC 8.9 7.1 7.5 6.6 7.6 10.1  NEUTROABS 6.2  --   --   --   --   --   HGB 8.3* 7.9* 8.2* 7.8* 7.8* 7.2*  HCT 28.0* 25.2* 26.8* 25.0* 25.3* 22.9*  MCV 84.6 81.6 83.0 82.0 82.1 81.2  PLT 297 275 301 305 260 231   Basic Metabolic Panel: Recent Labs  Lab 03/27/23 1409 03/28/23 0547 03/29/23 0428 03/30/23 0414 04/01/23 0237  NA 138 139 139 139 139  K 3.7 3.3* 3.9 3.7 4.2  CL 112* 111 114* 113* 110  CO2 18* 21* 19* 20* 21*  GLUCOSE 104* 89 82 74 120*  BUN 18 18 20 19  34*  CREATININE 1.20 1.08 1.05 1.05 1.38*  CALCIUM 9.3 9.2 9.8 9.5 9.4  MG  --   --  1.7  --  1.6*  PHOS  --   --   --   --  3.6    Studies: DG Swallowing Func-Speech Pathology  Result Date: 04/01/2023 Table formatting from the original result was not included. Modified  Barium Swallow Study Patient Details Name: Richard Carter MRN: [id number] Date of Birth: 24-Jul-1941 Today's Date: 04/01/2023 HPI/PMH: HPI: This is a 82 y.o. male  FRANKE OLT is a 82 y.o. male with medical history significant of Dementia, ETOH abuse, stroke, high-grade left carotid artery stenosis, seizure, A-fib on Eliquis, hypertension, hyperlipidemia, anemia, former smoker, ETOH abuse in remission for more than 3 years, who presents with slurred speech. Patient was recently hospitalized from 3/24-3/29 due to stroke.  Patient was found to have high-grade left carotid artery stenosis.  Dr. Gilda Crease of [facility] is following up with the patient. Patient was seen and in the process of bring scheduled outpatient for an Open Carotid Endarterectomy in the coming weeks. Current MRI revealed Acute infarct in the postcentral gyrus on the left 2. 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   CXR on 03/31/2023: Underinflation.  Chronic changes.  No  acute cardiopulmonary disease. Similar to CXR at admit. Clinical Impression: Patient appears to present with what appears to be moderate, chronic oropharyngeal dysphagia with contributing factors of cognitive deficits(Dementia at baseline; ETOH abuse history), generalized weakness, sensorimotor deficits, and structural age-related changes. Aspiration of thin liquids occurred. Pt is only wearing an Upper Denture plate; missing lower dentition. Oral phase is characterized by min lingual weakness but functional bolus control/holding, disorganized lingual transport w/ premature bolus loss of liquids into the pharynx, and lingual/lateral sulci/floor of mouth residue post-swallow. Pt requires a CUED f/u, DRY swallow to reduce and/or clear oral cavity and BOT of residue. Time required for completing the DRY swallow. Swallow initiation is delayed to the level of the posterior laryngeal surface of  epiglottis w/ Nectar/Honey/Puree consistencies; pyriform sinuses with thin liquid trial. Pharyngeal phase is noted for mildly decreased base of tongue retraction, mildly reduced hyolaryngeal excursion, and pharyngeal constriction. This resulted in min, diffuse pharyngeal residue post swallow. Epiglottic inversion is partial but more complete w/ heavier boluses. There is mild valleculae and intermittently pyriform sinus residue, greater with liquid consistencies. Pharyngeal stripping wave is mildly reduce w/ liquids but more complete w/ heavier boluses. No overt laryngeal penetration with Nectar/Honey/Puree consistencies; immediate Aspiration noted during the swallow w/ thin liquid trial. Subsequent DRY swallow was effective in reducing and/or clearing oropharyngeal residue. Amplitude/duration of cricopharyngeus opening is WFL. There is adequate/complete clearance through the UES, however, a min prominent CP segment was noted as well as min-appearing osteophyte at ~C5-6 of the cervical esophagus - no overt impact from either on the opening nor clearing of the cervical esophagus during bolus transfer noted during the study. A 13 mm barium tablet was NOT given. Consistencies tested were thin liquids x1 tsps, nectar x2 tsp, 5 cup sips, honey x1 tsp, pudding x2 tsp. Reviewed study findings with pt; he will need Supervision during meals for follow through w/ precautions/strategies d/t Baseline Cognitive decline. Recommend Pureed diet with well-moistened foods, Nectar liquids via CUP, Pills Crushed in Puree. Pt will need assistance/cuing for f/u, DRY swallow each bite/sip. Will need a f/u, DRY swallow to reduce residue of each bite/sip. Clear oral cavity prior to taking another bite/sip. Upright posture during and after meals, with oral care prior to meals to reduce oral bacterial load. Post study, Education provided to Team that strategies may reduce but cannot prevent aspiration risk, and that in order to perform  compensatory maneuvers, pt will need consistent Supervision and cuing to implement effectively. Medical Team verbalized understanding of risks, strategies, precautions, and diet recommendation. Recommend f/u w/ Palliative Care team for discussion of GOC w/ pt/Family moving forward d/t the chronic nature of pt's dysphagia. Pt does have a Baseline of GERD; any Esophageal phase Dysmotility can impact swallowing, even Retrograde activity and Phlegm can occur and increase risk for aspiration, coughing during meals. Recommend GI f/u if a concern. Factors that may increase risk of adverse event in presence of aspiration Rubye Oaks & Clearance Coots 2021): Factors that may increase risk of adverse event in presence of aspiration Rubye Oaks & Clearance Coots 2021): Poor general health and/or compromised immunity; Reduced cognitive function; Dependence for feeding and/or oral hygiene (CVA) Recommendations/Plan: Swallowing Evaluation Recommendations Swallowing Evaluation Recommendations Recommendations: PO diet PO Diet Recommendation: Dysphagia 1 (Pureed); Mildly thick liquids (Level 2, nectar thick) Liquid Administration via: Cup; No straw Medication Administration: Crushed with puree Supervision: Patient able to self-feed; Staff to assist with self-feeding; Full supervision/cueing for swallowing strategies Swallowing strategies  : Minimize environmental distractions; Slow rate; Small bites/sips;  Multiple dry swallows after each bite/sip; Follow solids with liquids Postural changes: Position pt fully upright for meals; Stay upright 30-60 min after meals; Out of bed for meals (REFLUX precs.) Oral care recommendations: Oral care BID (2x/day); Oral care before PO; Staff/trained caregiver to provide oral care (Denture care) Recommended consults: Consider dietitian consultation; Consider Palliative care; Consider GI consultation (if any Esophageal dysmotility is suspected d/t pt's c/o "Phlegm") Caregiver Recommendations: Avoid jello, ice cream, thin  soups, popsicles; Remove water pitcher; Have oral suction available Treatment Plan Treatment Plan Treatment recommendations: Defer treatment plan to SLP at other venue (see follow-up recommendations) (in the home) Follow-up recommendations: Follow physicians's recommendations for discharge plan and follow up therapies Functional status assessment: Patient has had a recent decline in their functional status and/or demonstrates limited ability to make significant improvements in function in a reasonable and predictable amount of time. Treatment frequency: Min 1x/week Treatment duration: 1 week Interventions: Aspiration precaution training; Patient/family education; Compensatory techniques; Diet toleration management by SLP Recommendations Recommendations for follow up therapy are one component of a multi-disciplinary discharge planning process, led by the attending physician.  Recommendations may be updated based on patient status, additional functional criteria and insurance authorization. Assessment: Orofacial Exam: Orofacial Exam Oral Cavity: Oral Hygiene: Xerostomia (min+) Oral Cavity - Dentition: Missing dentition; Dentures, top (lower) Orofacial Anatomy: WFL Oral Motor/Sensory Function: WFL (grossly) Anatomy: Anatomy: Suspected cervical osteophytes; Prominent cricopharyngeus (during the swallow) Boluses Administered: Boluses Administered Boluses Administered: Thin liquids (Level 0); Mildly thick liquids (Level 2, nectar thick); Moderately thick liquids (Level 3, honey thick); Puree  Oral Impairment Domain: Oral Impairment Domain Lip Closure: No labial escape Tongue control during bolus hold: Cohesive bolus between tongue to palatal seal Bolus preparation/mastication: Timely and efficient chewing and mashing (w/ liquids and purees given) Bolus transport/lingual motion: Brisk tongue motion Oral residue: Residue collection on oral structures Location of oral residue : Floor of mouth; Lateral sulci; Tongue  Initiation of pharyngeal swallow : Posterior laryngeal surface of the epiglottis (for Nectar/honey liquids; Valleculae for Puree; pyriform sinuses for thin trial)  Pharyngeal Impairment Domain: Pharyngeal Impairment Domain Soft palate elevation: No bolus between soft palate (SP)/pharyngeal wall (PW) Laryngeal elevation: Partial superior movement of thyroid cartilage/partial approximation of arytenoids to epiglottic petiole Anterior hyoid excursion: Partial anterior movement Epiglottic movement: Partial inversion Laryngeal vestibule closure: Incomplete, narrow column air/contrast in laryngeal vestibule (w/ thins) Pharyngeal stripping wave : Present - diminished (w/ liquids; present/complete w/ puree trials given) Pharyngeal contraction (A/P view only): N/A Pharyngoesophageal segment opening: Complete distension and complete duration, no obstruction of flow Tongue base retraction: Narrow column of contrast or air between tongue base and PPW (thins) Pharyngeal residue: Collection of residue within or on pharyngeal structures Location of pharyngeal residue: Tongue base; Valleculae; Pyriform sinuses; Aryepiglottic folds (f/u Dry swallow reduced and/or cleared)  Esophageal Impairment Domain: Esophageal Impairment Domain Esophageal clearance upright position: Complete clearance, esophageal coating (min prominent CP segment; min osteophyte at ~C5-6) Pill: Esophageal Impairment Domain Esophageal clearance upright position: Complete clearance, esophageal coating (min prominent CP segment; min osteophyte at ~C5-6) Penetration/Aspiration Scale Score: Penetration/Aspiration Scale Score 1.  Material does not enter airway: Mildly thick liquids (Level 2, nectar thick); Moderately thick liquids (Level 3, honey thick); Puree 7.  Material enters airway, passes BELOW cords and not ejected out despite cough attempt by patient: Thin liquids (Level 0) Compensatory Strategies: Compensatory Strategies Compensatory strategies: Yes Multiple  swallows: Effective (Dry swallow reduced and/or cleared oropharyngeal residue adequately)   General Information: Caregiver present: No  Diet Prior to this Study: Dysphagia 1 (pureed); Mildly thick liquids (Level 2, nectar thick)   Temperature : Normal (wbc WNL)   Respiratory Status: WFL   Supplemental O2: None (Room air)   History of Recent Intubation: No  Behavior/Cognition: Alert; Cooperative; Pleasant mood; Distractible; Requires cueing (baseline Dementia per chart) Self-Feeding Abilities: Able to self-feed; Needs set-up for self-feeding; Needs assist with self-feeding Baseline vocal quality/speech: Hypophonia/low volume (mild dysarthria - baseline) Volitional Cough: Able to elicit Volitional Swallow: Able to elicit Exam Limitations: Fatigue Goal Planning: Prognosis for improved oropharyngeal function: Guarded (-Fair) Barriers to Reach Goals: Cognitive deficits; Language deficits; Time post onset; Severity of deficits; Overall medical prognosis Barriers/Prognosis Comment: Baseline Dementia; ETOH abuse; CVA Patient/Family Stated Goal: none stated Consulted and agree with results and recommendations: Patient; Physician; Nurse; Dietitian (Palliative Care) Pain: Pain Assessment Pain Assessment: No/denies pain End of Session: Start Time:SLP Start Time (ACUTE ONLY): 0800 Stop Time: SLP Stop Time (ACUTE ONLY): 0900 Time Calculation:SLP Time Calculation (min) (ACUTE ONLY): 60 min Charges: SLP Evaluations $ SLP Speech Visit: 1 Visit SLP Evaluations $BSS Swallow: 1 Procedure $MBS Swallow: 1 Procedure $Swallowing Treatment: 1 Procedure SLP visit diagnosis: SLP Visit Diagnosis: Dysphagia, oropharyngeal phase (R13.12) (baseline Dementia; ETOH abuse; CVA) Past Medical History: Past Medical History: Diagnosis Date  Alcohol abuse   Dementia (HCC)   GERD (gastroesophageal reflux disease)   HLD (hyperlipidemia)   HTN (hypertension)   Seizures (HCC)   Stroke [facility])  Past Surgical History: Past Surgical History: Procedure Laterality  Date  skin cyst removal   Jerilynn Som, MS, CCC-SLP Speech Language Pathologist Rehab Services; [facility] [place] [phone] (ascom) Watson,Katherine 04/01/2023, 2:47 PM  US RENAL  Result Date: 04/01/2023 CLINICAL DATA:  Acute kidney injury. EXAM: RENAL / URINARY TRACT ULTRASOUND COMPLETE COMPARISON:  None Available. FINDINGS: Right Kidney: Renal measurements: 8.9 x 5.3 x 4.6 cm = volume: 112 mL. Probable 9 mm simple cyst is noted laterally. Echogenicity within normal limits. No mass or hydronephrosis visualized. Left Kidney: Renal measurements: 9.8 x 5.3 x 4.2 cm = volume: 116 mL. 2.9 cm simple cyst is noted. Echogenicity within normal limits. No mass or hydronephrosis visualized. Bladder: Appears normal for degree of bladder distention. Other: None. IMPRESSION: Mild right renal atrophy is noted. No hydronephrosis or renal obstruction is noted. Electronically Signed   By: Lupita Raider M.D.   On: 04/01/2023 14:23   [place] EKG SITE RITE  Result Date: 03/31/2023 If Site Rite image not attached, placement could not be confirmed due to current cardiac rhythm.  DG Chest Port 1 View  Result Date: 03/31/2023 CLINICAL DATA:  Aspiration pneumonia EXAM: PORTABLE CHEST 1 VIEW COMPARISON:  X-ray 03/28/2023 and older FINDINGS: Multiple old bilateral rib fractures once again identified. No consolidation, pneumothorax or effusion. Underinflation. Normal cardiopericardial silhouette. Calcified aorta. No edema. Overlapping cardiac leads IMPRESSION: Underinflation.  Chronic changes.  No acute cardiopulmonary disease Electronically Signed   By: Karen Kays M.D.   On: 03/31/2023 16:32    Scheduled Meds:  amLODipine  5 mg Oral Daily   [START ON 04/04/2023] ascorbic acid  500 mg Oral Daily   atorvastatin  20 mg Oral QPM   Chlorhexidine Gluconate Cloth  6 each Topical Daily   cyanocobalamin  1,000 mcg Intramuscular Daily   Followed by   Melene Muller ON 04/08/2023] vitamin B-12  1,000 mcg Oral Daily   [START ON 04/04/2023]  iron polysaccharides  150 mg Oral Daily   levETIRAcetam  500 mg Oral Q12H   pantoprazole (  PROTONIX) IV  40 mg Intravenous Q24H   sodium chloride flush  10-40 mL Intracatheter Q12H   Continuous Infusions:  sodium chloride 75 mL/hr at 04/01/23 0956   ferric gluconate (FERRLECIT) IVPB 250 mg (04/01/23 1440)   heparin 400 Units/hr (04/01/23 0531)   levETIRAcetam     PRN Meds: acetaminophen **OR** acetaminophen (TYLENOL) oral liquid 160 mg/5 mL **OR** acetaminophen, food thickener, hydrALAZINE, LORazepam, ondansetron (ZOFRAN) IV, sodium chloride flush  Time spent: 35 minutes  Author: Gillis Santa. MD [facility] 04/01/2023 3:31 PM  To reach On-call, see care teams to locate the attending and reach out to them via [email or web address]. If 7PM-7AM, please contact night-coverage If you still have difficulty reaching the attending provider, please page the [facility] (Director on Call) for [facility] on amion for assistance.

## 2023-04-01 NOTE — Progress Notes (Signed)
Heparin gtt stopped for per pharmacy due to elevated APTT.

## 2023-04-01 NOTE — TOC Progression Note (Signed)
Transition of Care [facility]) - Progression Note    Patient Details  Name: Richard Carter MRN: [id number] Date of Birth: [birth year]  [facility] of [facility] CM/SW Contact  Allena Katz, LCSW Phone Number: 04/01/2023, 8:50 AM  Clinical Narrative:   TOC spoke with patients POA [name] who reports he is unsure if pt will go home with 24/7 care or rehab. Pt scheduled for Endarectomy tomorrow at 7 am.  [name] reports he would like to speak with patients son and see what he wants to do and will get back with [place].     Expected Discharge Plan: Home w Home Health Services Barriers to Discharge: Continued Medical Work up  Expected Discharge Plan and Services       Living arrangements for the past 2 months: Single Family Home                             HH Agency: [facility] Date [facility] Agency Contacted: 03/29/23   Representative spoke with at [facility] Agency: [name]   Social Determinants of Health (SDOH) Interventions SDOH Screenings   Food Insecurity: No Food Insecurity (02/16/2023)  Housing: Low Risk  (02/16/2023)  Transportation Needs: No Transportation Needs (02/16/2023)  Utilities: Not At Risk (02/16/2023)  Tobacco Use: Medium Risk (03/19/2023)    Readmission Risk Interventions    04/21/2022    1:55 PM  Readmission Risk Prevention Plan  Post Dischage Appt Complete  Medication Screening Complete  Transportation Screening Complete

## 2023-04-01 NOTE — Consult Note (Signed)
ANTICOAGULATION CONSULT NOTE  Pharmacy Consult for IV Heparin Indication: atrial fibrillation ISO recent stroke  Patient Measurements: Height: 5' 7.01" (170.2 cm) Weight: 62.1 kg (136 lb 14.5 oz) IBW/kg (Calculated) : 66.12 Heparin Dosing Weight: 62.1  Labs: Recent Labs    03/30/23 0414 03/30/23 1259 03/31/23 0535 03/31/23 1547 04/01/23 0237 04/01/23 0409 04/01/23 1432 04/01/23 2216  HGB 7.8*  --  7.8*  --  7.2*  --   --   --   HCT 25.0*  --  25.3*  --  22.9*  --   --   --   PLT 305  --  260  --  231  --   --   --   APTT 69*   < > 58*   < > 146* 149* 76* 125*  HEPARINUNFRC >1.10*  --  1.09*  --  0.66  --   --   --   CREATININE 1.05  --   --   --  1.38*  --   --   --    < > = values in this interval not displayed.    Estimated Creatinine Clearance: 35.6 mL/min (A) (by C-G formula based on SCr of 1.38 mg/dL (H)).  Medical History: Past Medical History:  Diagnosis Date   Alcohol abuse    Dementia (HCC)    GERD (gastroesophageal reflux disease)    HLD (hyperlipidemia)    HTN (hypertension)    Seizures (HCC)    Stroke (HCC)     Medications:  Apixaban 5 mg PO twice daily (last dose 5/2 @ 8295)  Assessment: 82 y.o. male with PMH including stroke, CAD, Afib on Eliquis, HTN, HLD, anemia who presents with slurred speech/CVA. Onset of symptoms on 5/2 @ 1230. Also initially had right arm weakness which has now resolved. Pt is scheduled for open left CEA on 5/7 and pharmacy has been consulted to manage peri-operative anticoagulation with heparin. Provider requested to omit bolus ISO recent stroke and communicated that plan is to stop the heparin 6 hrs before CEA planned 5/7.   Date Time aPTT/HL Rate/Comment 0503 1724 41/>1.10 Baseline 0504 0031 aPTT 136 Supratherapeutic 0504 1022 aPTT 136 Supratherapeutic 0504 1949 aPTT 65 Slightly below goal range (500un/hr > 550un/hr) 0505 0414 aPTT 69 Therapeutic x 1, HL not correlating 0505 1259 aPTT 79 Therapeutic x 2 0506 0535 aPTT  58 Subtherapeutic, HL not correlating 0506 1547 aPTT 36 Subtherapeutic 0507    0237   aPTT 146        elevated, suspect lab error 0507    0409   aPTT 149        elevated, appears to be valid 0507 1432 aPTT 76 Therapeutic x 1 0507    2216   aPTT 126        elevated   Goal of Therapy:  Heparin level 0.3-0.5 units/ml once heparin level and aPTT correlate.  aPTT 66-85 seconds Monitor platelets by anticoagulation protocol: Yes    Plan:  5/07:  aPTT @ 2216 = 126, elevated  - Will hold heparin gtt for 30 min and then restart at 250 units/hr - Will recheck aPTT and HL 8 hrs after restart  -Continue to follow aPTT until correlation established with HL. Check HL tomorrow AM --Daily CBC per protocol while on IV heparin --Open left carotid endarterectomy 04/02/23 with vascular surgery; heparin to stop when patient is called for OR  Richard Carter D 04/01/2023 10:55 PM

## 2023-04-01 NOTE — Consult Note (Signed)
ANTICOAGULATION CONSULT NOTE  Pharmacy Consult for IV Heparin Indication: atrial fibrillation ISO recent stroke  Patient Measurements: Height: 5' 7.01" (170.2 cm) Weight: 62.1 kg (136 lb 14.5 oz) IBW/kg (Calculated) : 66.12 Heparin Dosing Weight: 62.1  Labs: Recent Labs    03/30/23 0414 03/30/23 1259 03/31/23 0535 03/31/23 1547 04/01/23 0237 04/01/23 0409 04/01/23 1432  HGB 7.8*  --  7.8*  --  7.2*  --   --   HCT 25.0*  --  25.3*  --  22.9*  --   --   PLT 305  --  260  --  231  --   --   APTT 69*   < > 58*   < > 146* 149* 76*  HEPARINUNFRC >1.10*  --  1.09*  --  0.66  --   --   CREATININE 1.05  --   --   --  1.38*  --   --    < > = values in this interval not displayed.    Estimated Creatinine Clearance: 35.6 mL/min (A) (by C-G formula based on SCr of 1.38 mg/dL (H)).  Medical History: Past Medical History:  Diagnosis Date   Alcohol abuse    Dementia (HCC)    GERD (gastroesophageal reflux disease)    HLD (hyperlipidemia)    HTN (hypertension)    Seizures (HCC)    Stroke (HCC)     Medications:  Apixaban 5 mg PO twice daily (last dose 5/2 @ 1610)  Assessment: 81 y.o. male with PMH including stroke, CAD, Afib on Eliquis, HTN, HLD, anemia who presents with slurred speech/CVA. Onset of symptoms on 5/2 @ 1230. Also initially had right arm weakness which has now resolved. Pt is scheduled for open left CEA on 5/7 and pharmacy has been consulted to manage peri-operative anticoagulation with heparin. Provider requested to omit bolus ISO recent stroke and communicated that plan is to stop the heparin 6 hrs before CEA planned 5/7.   Date Time aPTT/HL Rate/Comment 0503 1724 41/>1.10 Baseline 0504 0031 aPTT 136 Supratherapeutic 0504 1022 aPTT 136 Supratherapeutic 0504 1949 aPTT 65 Slightly below goal range (500un/hr > 550un/hr) 0505 0414 aPTT 69 Therapeutic x 1, HL not correlating 0505 1259 aPTT 79 Therapeutic x 2 0506 0535 aPTT 58 Subtherapeutic, HL not  correlating 0506 1547 aPTT 36 Subtherapeutic 0507    0237   aPTT 146        elevated, suspect lab error 0507    0409   aPTT 149        elevated, appears to be valid 0507 1432 aPTT 76 Therapeutic x 1  Goal of Therapy:  Heparin level 0.3-0.5 units/ml once heparin level and aPTT correlate.  aPTT 66-85 seconds Monitor platelets by anticoagulation protocol: Yes    Plan:  --aPTT is therapeutic x 1 --Continue heparin infusion at 400 units/hr --Re-check confirmatory aPTT in 8 hours. Continue to follow aPTT until correlation established with HL. Check HL tomorrow AM --Daily CBC per protocol while on IV heparin --Open left carotid endarterectomy 04/02/23 with vascular surgery; heparin to stop when patient is called for OR  Tressie Ellis 04/01/2023 3:00 PM

## 2023-04-01 NOTE — Progress Notes (Signed)
Removed male primafit to see if patient can remember to use the urinal to void. Will kept suction at the bedside if need arises again.

## 2023-04-01 NOTE — Consult Note (Signed)
ANTICOAGULATION CONSULT NOTE  Pharmacy Consult for heparin Indication: atrial fibrillation ISO recent stroke  No Known Allergies  Patient Measurements: Height: 5' 7.01" (170.2 cm) Weight: 62.1 kg (136 lb 14.5 oz) IBW/kg (Calculated) : 66.12 Heparin Dosing Weight: 62.1  Vital Signs: Temp: 98.1 F (36.7 C) (05/07 0409) Temp Source: Oral (05/07 0409) BP: 142/68 (05/07 0409) Pulse Rate: 57 (05/07 0409)  Labs: Recent Labs    03/30/23 0414 03/30/23 1259 03/31/23 0535 03/31/23 1547 03/31/23 1817 04/01/23 0237 04/01/23 0409  HGB 7.8*  --  7.8*  --   --  7.2*  --   HCT 25.0*  --  25.3*  --   --  22.9*  --   PLT 305  --  260  --   --  231  --   APTT 69*   < > 58*   < > 34 146* 149*  HEPARINUNFRC >1.10*  --  1.09*  --   --  0.66  --   CREATININE 1.05  --   --   --   --  1.38*  --    < > = values in this interval not displayed.     Estimated Creatinine Clearance: 35.6 mL/min (A) (by C-G formula based on SCr of 1.38 mg/dL (H)).   Medical History: Past Medical History:  Diagnosis Date   Alcohol abuse    Dementia (HCC)    GERD (gastroesophageal reflux disease)    HLD (hyperlipidemia)    HTN (hypertension)    Seizures (HCC)    Stroke (HCC)     Medications:  Apixaban 5 mg PO twice daily (last dose 5/2 @ 8413)  Assessment: 82 y.o. male with PMH including stroke, CAD, Afib on Eliquis, HTN, HLD, anemia who presents with slurred speech/CVA. Onset of symptoms on 5/2 @ 1230. Also initially had right arm weakness which has now resolved. Pt is scheduled for open left CEA on 5/7 and pharmacy has been consulted to manage peri-operative anticoagulation with heparin. Provider requested to omit bolus ISO recent stroke and communicated that plan is to stop the heparin 6 hrs before CEA planned 5/7.   Date Time aPTT/HL Rate/Comment 0503 1724 41/>1.10 Baseline 0504 0031 aPTT 136 Supratherapeutic 0504 1022 aPTT 136 Supratherapeutic 0504 1949 aPTT 65 Slightly below goal range (500un/hr  > 550un/hr) 0505 0414 aPTT 69 Therapeutic x 1, HL not correlating 0505 1259 aPTT 79 Therapeutic x 2 0506 0535 aPTT 58 Subtherapeutic, HL not correlating 0506 1547 aPTT 36 Subtherapeutic 0507    0237   aPTT 146        elevated, suspect lab error 0507    0409   aPTT 149        elevated, appears to be valid  Goal of Therapy:  Heparin level 0.3-0.5 units/ml once heparin level and aPTT correlate.  aPTT 66-85 seconds Monitor platelets by anticoagulation protocol: Yes    Plan:  5/07: aPTT @ 0237 = 146,  elevated  - prior 2 aPTT's were subtherapeutic so suspect lab error, will order STAT aPTT repeat 5/07: aPTT @ 0409 = 149, elevated X 2, will accept as valid - will hold heparin infusion for 30 min and restart at 400 units/hr - will recheck aPTT 8 hrs after restart - will recheck HL on 5/8 with AM labs Switch to heparin level monitoring once aPTT and heparin level correlate.  Scherrie Gerlach, PharmD Clinical Pharmacist 04/01/2023 4:57 AM

## 2023-04-01 NOTE — Progress Notes (Signed)
Occupational Therapy Treatment Patient Details Name: Richard Carter MRN: [id number] DOB: Nov 18, 1941 Today's Date: 04/01/2023   History of present illness Pt is an 82 y.o. male presenting to hospital 03/27/23 with sudden onset of difficulties with speech; R sided weakness also noted but resolved; R sided sensation changes noted as well as facial droop.  Imaging showing acute infarct in the postcentral gyrus on L.  Pt admitted with stroke, stenosis of L carotid artery, a-fib, and htn.  Recent hospitalization 3/24-3/29 for stroke.  PMH includes alcohol abuse, dementia, htn, seizures, stroke (with L sided hemiparesis), a-fib, AKI, pubic ramus and acetabular fx 2020, UTI, chronic L middle finger contracture.  Per notes pt also with h/o R UE weakness.   OT comments  Upon entering the room, pt supine in bed and sleeping soundly. OT discussed self feeding with pt who reports it has been more difficulty recently. OT repositioning pt in bed and placed into chair position. Built up foam handle added to utensil and pt given magic cup. Pt able to hold cup in L hand and scoop and bring to mouth with R UE. Pt initially needing assistance secondary to magic cup being frozen and difficult to scoop but once it began to soften he did well with only 1 drop from hand and minimal spillage from mouth. Pt feeding himself entire magic cup. RN and NT notified and built up handle for utensil left in room for staff to utilize in assisting him with next meal.    Recommendations for follow up therapy are one component of a multi-disciplinary discharge planning process, led by the attending physician.  Recommendations may be updated based on patient status, additional functional criteria and insurance authorization.    Assistance Recommended at Discharge Frequent or constant Supervision/Assistance  Patient can return home with the following  A little help with walking and/or transfers;A lot of help with  bathing/dressing/bathroom;Assistance with cooking/housework;Direct supervision/assist for medications management;Direct supervision/assist for financial management;Assist for transportation;Help with stairs or ramp for entrance   Equipment Recommendations  None recommended by OT       Precautions / Restrictions Precautions Precautions: Fall Precaution Comments: Seizure precautions              ADL either performed or assessed with clinical judgement   ADL Overall ADL's : Needs assistance/impaired Eating/Feeding: Set up;Sitting;Supervision/ safety;Cueing for safety Eating/Feeding Details (indicate cue type and reason): built up utensil grip                                         Vision Patient Visual Report: No change from baseline            Cognition Arousal/Alertness: Awake/alert Behavior During Therapy: Flat affect Overall Cognitive Status: History of cognitive impairments - at baseline                                                     Pertinent Vitals/ Pain       Pain Assessment Pain Assessment: No/denies pain         Frequency  Min 2X/week        Progress Toward Goals  OT Goals(current goals can now be found in the care plan section)  Progress towards OT goals: Progressing toward  goals     Plan Discharge plan remains appropriate;Frequency remains appropriate       AM-PAC OT "6 Clicks" Daily Activity     Outcome Measure   Help from another person eating meals?: None Help from another person taking care of personal grooming?: A Little Help from another person toileting, which includes using toliet, bedpan, or urinal?: A Lot Help from another person bathing (including washing, rinsing, drying)?: A Lot Help from another person to put on and taking off regular upper body clothing?: A Little Help from another person to put on and taking off regular lower body clothing?: A Lot 6 Click Score: 16    End of  Session    OT Visit Diagnosis: Unsteadiness on feet (R26.81);Other abnormalities of gait and mobility (R26.89)   Activity Tolerance Patient tolerated treatment well   Patient Left with call bell/phone within reach;in bed;with bed alarm set   Nurse Communication Mobility status;Other (comment) (built up utensil left in room)        Time: 9562-1308 OT Time Calculation (min): 25 min  Charges: OT General Charges $OT Visit: 1 Visit OT Treatments $Self Care/Home Management : 23-37 mins  Jackquline Denmark, MS, OTR/L , CBIS ascom [phone]  04/01/23, 3:51 PM

## 2023-04-01 NOTE — H&P (View-Only) (Signed)
Progress Note    04/01/2023 12:37 PM * No surgery date entered *  Subjective:   Subjective:   This is a 82 y.o. male  Richard Carter is a 82 y.o. 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 Patient was recently hospitalized from 3/24-3/29 due to stroke    Patient was noted to have slurred speech and right arm weakness. His right arm weakness has resolved. He still has persistent slurred speech. No facial droop. At his normal baseline, patient is oriented to the place and person, but not to the time. His mental status seems to be close to baseline. He moves all extremities with symmetric weakness in all extremities. Has chronic left middle finger contracture. Denies chest pain, cough, shortness of breath. No nausea, vomiting, diarrhea or abdominal pain. Patient seen by speech pathology and placed on a dysphagia diet due to aspiration risk.   Vitals:   04/01/23 0900 04/01/23 1207  BP: 122/77 128/73  Pulse: 71 81  Resp: 18 18  Temp: 98 F (36.7 C) 98 F (36.7 C)  SpO2: 98% 98%   Physical Exam: Cardiac:   Atrial Fibrillation, without  Murmurs, rubs or gallops; with carotid bruits  Lungs:   normal non-labored breathing, without Rales, rhonchi, wheezing  Incisions:  NONE Extremities:  without ischemic changes, without Gangrene , without cellulitis; without open wounds;  Abdomen:  Positive Bowel Sounds, soft, NT/ND, no masses  Neurologic:   A&O X 3;  No focal weakness or paresthesias are detected; speech is slightly slurred with a history of right sided facial droop.   CBC    Component Value Date/Time   WBC 10.1 04/01/2023 0237   RBC 2.82 (L) 04/01/2023 0237   HGB 7.2 (L) 04/01/2023 0237   HGB 12.9 (L) 10/25/2014 1048   HCT 22.9 (L) 04/01/2023 0237   HCT 38.6 (L)  10/25/2014 1048   PLT 231 04/01/2023 0237   PLT 93 (L) 10/25/2014 1048   MCV 81.2 04/01/2023 0237   MCV 108 (H) 10/25/2014 1048   MCH 25.5 (L) 04/01/2023 0237   MCHC 31.4 04/01/2023 0237   RDW 16.7 (H) 04/01/2023 0237   RDW 19.6 (H) 10/25/2014 1048   LYMPHSABS 1.4 03/27/2023 1409   LYMPHSABS 0.8 (L) 10/25/2014 1048   MONOABS 0.8 03/27/2023 1409   MONOABS 0.9 10/25/2014 1048   EOSABS 0.4 03/27/2023 1409   EOSABS 0.0 10/25/2014 1048   BASOSABS 0.1 03/27/2023 1409   BASOSABS 0.0 10/25/2014 1048    BMET    Component Value Date/Time   NA 139 04/01/2023 0237   NA 133 (L) 10/25/2014 1048   K 4.2 04/01/2023 0237   K 3.3 (L) 10/25/2014 1048   CL 110 04/01/2023 0237   CL 92 (L) 10/25/2014 1048   CO2 21 (L) 04/01/2023 0237   CO2 30 10/25/2014 1048   GLUCOSE 120 (H) 04/01/2023 0237   GLUCOSE 125 (H) 10/25/2014 1048   BUN 34 (H) 04/01/2023 0237   BUN 18 10/25/2014 1048   CREATININE 1.38 (H) 04/01/2023 0237   CREATININE 1.02 10/25/2014 1048   CALCIUM 9.4 04/01/2023 0237   CALCIUM 8.9 10/25/2014 1048   GFRNONAA 51 (L) 04/01/2023 0237   GFRNONAA >60 10/25/2014 1048   GFRNONAA >60 06/09/2014 0431   GFRAA >60 02/24/2020 0401   GFRAA >60 10/25/2014 1048   GFRAA >60   06/09/2014 0431    INR    Component Value Date/Time   INR 1.5 (H) 03/28/2023 1650     Intake/Output Summary (Last 24 hours) at 04/01/2023 1237 Last data filed at 04/01/2023 1200 Gross per 24 hour  Intake 120 ml  Output 1200 ml  Net -1080 ml     Assessment/Plan:  82 y.o. male is s/p CVA with slurred speech and right sided weakness. * No surgery date entered *   PLAN: Heparin on hold to the OR  Continue Dysphagia diet until NPO after midnight.   Vascular surgery will schedule the patient for an open left carotid endarterectomy Wednesday, 04/02/2023.  Patient was seen outpatient with Dr. Greg Schnier on 03/17/2023 and patient had agreed with his caregiver to proceed with surgery.  I discussed the procedure in detail  with the patient again this morning.  He verbalizes understanding and agrees to proceed with the procedure.  I will also follow-up with the patient's caregiver which I believe is [name] Barnes at [phone]) and inform him of the plan.   DVT prophylaxis:  Heparin Infusion   Mckynzie Liwanag R Petra Sargeant [facility] 04/01/2023 12:37 PM   

## 2023-04-02 ENCOUNTER — Inpatient Hospital Stay: Payer: Medicare HMO | Admitting: Certified Registered"

## 2023-04-02 ENCOUNTER — Encounter
Admission: EM | Disposition: A | Discharge status: Skilled Nursing Facility | Payer: Medicare HMO | Source: Home / Self Care | Attending: Student

## 2023-04-02 ENCOUNTER — Other Ambulatory Visit: Payer: Self-pay

## 2023-04-02 ENCOUNTER — Encounter: Payer: Self-pay | Admitting: Internal Medicine

## 2023-04-02 DIAGNOSIS — I6359 Cerebral infarction due to unspecified occlusion or stenosis of other cerebral artery: Secondary | ICD-10-CM | POA: Diagnosis not present

## 2023-04-02 DIAGNOSIS — I639 Cerebral infarction, unspecified: Secondary | ICD-10-CM | POA: Diagnosis not present

## 2023-04-02 DIAGNOSIS — Z8673 Personal history of transient ischemic attack (TIA), and cerebral infarction without residual deficits: Secondary | ICD-10-CM

## 2023-04-02 DIAGNOSIS — I6522 Occlusion and stenosis of left carotid artery: Secondary | ICD-10-CM | POA: Diagnosis not present

## 2023-04-02 HISTORY — PX: ENDARTERECTOMY: SHX5162

## 2023-04-02 LAB — CBC
HCT: 22.4 % — ABNORMAL LOW (ref 39.0–52.0)
Hemoglobin: 7 g/dL — ABNORMAL LOW (ref 13.0–17.0)
MCH: 25.8 pg — ABNORMAL LOW (ref 26.0–34.0)
MCHC: 31.3 g/dL (ref 30.0–36.0)
MCV: 82.7 fL (ref 80.0–100.0)
Platelets: 199 10*3/uL (ref 150–400)
RBC: 2.71 MIL/uL — ABNORMAL LOW (ref 4.22–5.81)
RDW: 16.9 % — ABNORMAL HIGH (ref 11.5–15.5)
WBC: 8.5 10*3/uL (ref 4.0–10.5)
nRBC: 0 % (ref 0.0–0.2)

## 2023-04-02 LAB — BASIC METABOLIC PANEL
Anion gap: 4 — ABNORMAL LOW (ref 5–15)
BUN: 29 mg/dL — ABNORMAL HIGH (ref 8–23)
CO2: 22 mmol/L (ref 22–32)
Calcium: 9.3 mg/dL (ref 8.9–10.3)
Chloride: 115 mmol/L — ABNORMAL HIGH (ref 98–111)
Creatinine, Ser: 1.07 mg/dL (ref 0.61–1.24)
GFR, Estimated: >60 mL/min (ref >60)
Glucose, Bld: 93 mg/dL (ref 70–99)
Potassium: 3.9 mmol/L (ref 3.5–5.1)
Sodium: 141 mmol/L (ref 135–145)

## 2023-04-02 LAB — APTT: aPTT: 68 seconds — ABNORMAL HIGH (ref 24–36)

## 2023-04-02 LAB — HEMOGLOBIN AND HEMATOCRIT, BLOOD
HCT: 22.5 % — ABNORMAL LOW (ref 39.0–52.0)
HCT: 25.3 % — ABNORMAL LOW (ref 39.0–52.0)
Hemoglobin: 6.8 g/dL — ABNORMAL LOW (ref 13.0–17.0)
Hemoglobin: 7.7 g/dL — ABNORMAL LOW (ref 13.0–17.0)

## 2023-04-02 LAB — PREPARE RBC (CROSSMATCH)

## 2023-04-02 LAB — PHOSPHORUS: Phosphorus: 2.9 mg/dL (ref 2.5–4.6)

## 2023-04-02 LAB — TYPE AND SCREEN

## 2023-04-02 LAB — MAGNESIUM: Magnesium: 2.1 mg/dL (ref 1.7–2.4)

## 2023-04-02 LAB — HEPARIN LEVEL (UNFRACTIONATED): Heparin Unfractionated: 0.2 IU/mL — ABNORMAL LOW (ref 0.30–0.70)

## 2023-04-02 LAB — BPAM RBC

## 2023-04-02 LAB — GLUCOSE, CAPILLARY
Glucose-Capillary: 104 mg/dL — ABNORMAL HIGH (ref 70–99)
Glucose-Capillary: 106 mg/dL — ABNORMAL HIGH (ref 70–99)

## 2023-04-02 SURGERY — ENDARTERECTOMY, CAROTID
Anesthesia: General | Laterality: Left

## 2023-04-02 MED ORDER — ACETAMINOPHEN 325 MG PO TABS
325.0000 mg | ORAL_TABLET | ORAL | Status: DC | PRN
  Administered 2023-04-03 – 2023-04-06 (×2): 650 mg via ORAL
  Filled 2023-04-02 (×3): qty 2

## 2023-04-02 MED ORDER — METHYLPREDNISOLONE SODIUM SUCC 125 MG IJ SOLR: 125.0000 mg | Freq: Once | INTRAMUSCULAR | Status: DC | PRN

## 2023-04-02 MED ORDER — DEXAMETHASONE SODIUM PHOSPHATE 10 MG/ML IJ SOLN
INTRAMUSCULAR | Status: AC
  Filled 2023-04-02: qty 1

## 2023-04-02 MED ORDER — FAMOTIDINE 20 MG PO TABS: 40.0000 mg | ORAL_TABLET | Freq: Once | ORAL | Status: DC | PRN

## 2023-04-02 MED ORDER — CEFAZOLIN SODIUM-DEXTROSE 2-4 GM/100ML-% IV SOLN
2.0000 g | Freq: Three times a day (TID) | INTRAVENOUS | Status: AC
  Administered 2023-04-02 (×2): 2 g via INTRAVENOUS
  Filled 2023-04-02 (×2): qty 100

## 2023-04-02 MED ORDER — ALUM & MAG HYDROXIDE-SIMETH 200-200-20 MG/5ML PO SUSP: 15.0000 mL | ORAL | Status: DC | PRN

## 2023-04-02 MED ORDER — DOCUSATE SODIUM 100 MG PO CAPS
100.0000 mg | ORAL_CAPSULE | Freq: Every day | ORAL | Status: DC
  Administered 2023-04-04 – 2023-04-07 (×4): 100 mg via ORAL
  Filled 2023-04-02 (×4): qty 1

## 2023-04-02 MED ORDER — SODIUM CHLORIDE 0.9 % IV SOLN
INTRAVENOUS | Status: DC | PRN
  Administered 2023-04-02: 251 mL via TOPICAL

## 2023-04-02 MED ORDER — POTASSIUM CHLORIDE IN NACL 20-0.9 MEQ/L-% IV SOLN
INTRAVENOUS | Status: DC
  Filled 2023-04-02 (×3): qty 1000

## 2023-04-02 MED ORDER — ROCURONIUM BROMIDE 100 MG/10ML IV SOLN
INTRAVENOUS | Status: DC | PRN
  Administered 2023-04-02: 50 mg via INTRAVENOUS
  Administered 2023-04-02: 20 mg via INTRAVENOUS

## 2023-04-02 MED ORDER — DEXAMETHASONE SODIUM PHOSPHATE 10 MG/ML IJ SOLN
INTRAMUSCULAR | Status: DC | PRN
  Administered 2023-04-02: 5 mg via INTRAVENOUS

## 2023-04-02 MED ORDER — MIDAZOLAM HCL 2 MG/ML PO SYRP: 8.0000 mg | ORAL_SOLUTION | Freq: Once | ORAL | Status: DC | PRN

## 2023-04-02 MED ORDER — HEPARIN (PORCINE) 25000 UT/250ML-% IV SOLN
350.0000 [IU]/h | INTRAVENOUS | Status: DC
  Administered 2023-04-02: 250 [IU]/h via INTRAVENOUS
  Filled 2023-04-02: qty 250

## 2023-04-02 MED ORDER — PROMETHAZINE HCL 25 MG/ML IJ SOLN: 6.2500 mg | INTRAMUSCULAR | Status: DC | PRN

## 2023-04-02 MED ORDER — FENTANYL CITRATE (PF) 100 MCG/2ML IJ SOLN
INTRAMUSCULAR | Status: AC
  Filled 2023-04-02: qty 2

## 2023-04-02 MED ORDER — PROPOFOL 10 MG/ML IV BOLUS
INTRAVENOUS | Status: DC | PRN
  Administered 2023-04-02: 50 mg via INTRAVENOUS
  Administered 2023-04-02: 40 mg via INTRAVENOUS

## 2023-04-02 MED ORDER — OXYCODONE HCL 5 MG/5ML PO SOLN: 5.0000 mg | Freq: Once | ORAL | Status: DC | PRN

## 2023-04-02 MED ORDER — HEPARIN SODIUM (PORCINE) 5000 UNIT/ML IJ SOLN
INTRAMUSCULAR | Status: AC
  Filled 2023-04-02: qty 1

## 2023-04-02 MED ORDER — HYDROMORPHONE HCL 1 MG/ML IJ SOLN: 1.0000 mg | Freq: Once | INTRAMUSCULAR | Status: DC | PRN

## 2023-04-02 MED ORDER — PANTOPRAZOLE SODIUM 40 MG IV SOLR
40.0000 mg | Freq: Every day | INTRAVENOUS | Status: DC
  Administered 2023-04-02 – 2023-04-03 (×2): 40 mg via INTRAVENOUS
  Filled 2023-04-02 (×2): qty 10

## 2023-04-02 MED ORDER — HEPARIN SODIUM (PORCINE) 1000 UNIT/ML IJ SOLN
INTRAMUSCULAR | Status: DC | PRN
  Administered 2023-04-02: 7000 [IU] via INTRAVENOUS

## 2023-04-02 MED ORDER — PHENYLEPHRINE HCL (PRESSORS) 10 MG/ML IV SOLN
INTRAVENOUS | Status: DC | PRN
  Administered 2023-04-02 (×5): 100 ug via INTRAVENOUS
  Administered 2023-04-02: 50 ug via INTRAVENOUS

## 2023-04-02 MED ORDER — SORBITOL 70 % SOLN: 30.0000 mL | Freq: Every day | Status: DC | PRN

## 2023-04-02 MED ORDER — LACTATED RINGERS IV SOLN: INTRAVENOUS | Status: DC | PRN

## 2023-04-02 MED ORDER — HYDRALAZINE HCL 20 MG/ML IJ SOLN: 5.0000 mg | INTRAMUSCULAR | Status: DC | PRN

## 2023-04-02 MED ORDER — FENTANYL CITRATE (PF) 100 MCG/2ML IJ SOLN
INTRAMUSCULAR | Status: DC | PRN
  Administered 2023-04-02 (×4): 50 ug via INTRAVENOUS

## 2023-04-02 MED ORDER — PHENYLEPHRINE HCL-NACL 20-0.9 MG/250ML-% IV SOLN
INTRAVENOUS | Status: AC
  Filled 2023-04-02: qty 250

## 2023-04-02 MED ORDER — MAGNESIUM HYDROXIDE 400 MG/5ML PO SUSP: 30.0000 mL | Freq: Every day | ORAL | Status: DC | PRN

## 2023-04-02 MED ORDER — ACETAMINOPHEN 650 MG RE SUPP: 325.0000 mg | RECTAL | Status: DC | PRN

## 2023-04-02 MED ORDER — FENTANYL CITRATE (PF) 100 MCG/2ML IJ SOLN: 25.0000 ug | INTRAMUSCULAR | Status: DC | PRN

## 2023-04-02 MED ORDER — METOPROLOL TARTRATE 5 MG/5ML IV SOLN: 2.0000 mg | INTRAVENOUS | Status: DC | PRN

## 2023-04-02 MED ORDER — HEMOSTATIC AGENTS (NO CHARGE) OPTIME
TOPICAL | Status: DC | PRN
  Administered 2023-04-02: 1 via TOPICAL

## 2023-04-02 MED ORDER — FIBRIN SEALANT 2 ML SINGLE DOSE KIT
PACK | CUTANEOUS | Status: DC | PRN
  Administered 2023-04-02: 2 mL via TOPICAL

## 2023-04-02 MED ORDER — PHENYLEPHRINE HCL-NACL 20-0.9 MG/250ML-% IV SOLN
INTRAVENOUS | Status: DC | PRN
  Administered 2023-04-02: 25 ug/min via INTRAVENOUS

## 2023-04-02 MED ORDER — ETOMIDATE 2 MG/ML IV SOLN
INTRAVENOUS | Status: AC
  Filled 2023-04-02: qty 10

## 2023-04-02 MED ORDER — LIDOCAINE HCL (PF) 2 % IJ SOLN
INTRAMUSCULAR | Status: AC
  Filled 2023-04-02: qty 5

## 2023-04-02 MED ORDER — DEXMEDETOMIDINE HCL IN NACL 80 MCG/20ML IV SOLN
INTRAVENOUS | Status: DC | PRN
  Administered 2023-04-02 (×3): 4 ug via INTRAVENOUS

## 2023-04-02 MED ORDER — DROPERIDOL 2.5 MG/ML IJ SOLN: 0.6250 mg | Freq: Once | INTRAMUSCULAR | Status: DC | PRN

## 2023-04-02 MED ORDER — HEPARIN SODIUM (PORCINE) 1000 UNIT/ML IJ SOLN
INTRAMUSCULAR | Status: AC
  Filled 2023-04-02: qty 10

## 2023-04-02 MED ORDER — SODIUM CHLORIDE 0.9% IV SOLUTION: Freq: Once | INTRAVENOUS | Status: DC

## 2023-04-02 MED ORDER — FENTANYL CITRATE PF 50 MCG/ML IJ SOSY: 12.5000 ug | PREFILLED_SYRINGE | Freq: Once | INTRAMUSCULAR | Status: DC | PRN

## 2023-04-02 MED ORDER — PHENOL 1.4 % MT LIQD: 1.0000 | OROMUCOSAL | Status: DC | PRN

## 2023-04-02 MED ORDER — CEFAZOLIN SODIUM-DEXTROSE 2-4 GM/100ML-% IV SOLN
INTRAVENOUS | Status: AC
  Filled 2023-04-02: qty 100

## 2023-04-02 MED ORDER — ONDANSETRON HCL 4 MG/2ML IJ SOLN
INTRAMUSCULAR | Status: DC | PRN
  Administered 2023-04-02: 4 mg via INTRAVENOUS

## 2023-04-02 MED ORDER — ASPIRIN 81 MG PO TBEC
81.0000 mg | DELAYED_RELEASE_TABLET | Freq: Every day | ORAL | Status: DC
  Administered 2023-04-03 – 2023-04-07 (×5): 81 mg via ORAL
  Filled 2023-04-02 (×5): qty 1

## 2023-04-02 MED ORDER — 0.9 % SODIUM CHLORIDE (POUR BTL) OPTIME
TOPICAL | Status: DC | PRN
  Administered 2023-04-02: 100 mL

## 2023-04-02 MED ORDER — ACETAMINOPHEN 10 MG/ML IV SOLN: 1000.0000 mg | Freq: Once | INTRAVENOUS | Status: DC | PRN

## 2023-04-02 MED ORDER — OXYCODONE HCL 5 MG PO TABS: 5.0000 mg | ORAL_TABLET | ORAL | Status: DC | PRN

## 2023-04-02 MED ORDER — ONDANSETRON HCL 4 MG/2ML IJ SOLN: 4.0000 mg | Freq: Four times a day (QID) | INTRAMUSCULAR | Status: DC | PRN

## 2023-04-02 MED ORDER — LIDOCAINE HCL (PF) 1 % IJ SOLN
INTRAMUSCULAR | Status: AC
  Filled 2023-04-02: qty 30

## 2023-04-02 MED ORDER — OXYCODONE HCL 5 MG PO TABS: 5.0000 mg | ORAL_TABLET | Freq: Once | ORAL | Status: DC | PRN

## 2023-04-02 MED ORDER — SUGAMMADEX SODIUM 200 MG/2ML IV SOLN
INTRAVENOUS | Status: DC | PRN
  Administered 2023-04-02: 200 mg via INTRAVENOUS

## 2023-04-02 MED ORDER — DIPHENHYDRAMINE HCL 50 MG/ML IJ SOLN: 50.0000 mg | Freq: Once | INTRAMUSCULAR | Status: DC | PRN

## 2023-04-02 MED ORDER — SODIUM CHLORIDE 0.9 % IV SOLN: 500.0000 mL | Freq: Once | INTRAVENOUS | Status: DC | PRN

## 2023-04-02 MED ORDER — LABETALOL HCL 5 MG/ML IV SOLN: 10.0000 mg | INTRAVENOUS | Status: DC | PRN

## 2023-04-02 MED ORDER — ROCURONIUM BROMIDE 10 MG/ML (PF) SYRINGE
PREFILLED_SYRINGE | INTRAVENOUS | Status: AC
  Filled 2023-04-02: qty 10

## 2023-04-02 MED ORDER — GUAIFENESIN-DM 100-10 MG/5ML PO SYRP
15.0000 mL | ORAL_SOLUTION | ORAL | Status: DC | PRN
  Administered 2023-04-07: 15 mL via ORAL
  Filled 2023-04-02: qty 20

## 2023-04-02 MED ORDER — SODIUM CHLORIDE 0.9 % IV SOLN: INTRAVENOUS | Status: DC

## 2023-04-02 MED ORDER — ETOMIDATE 2 MG/ML IV SOLN
INTRAVENOUS | Status: DC | PRN
  Administered 2023-04-02: 8 mg via INTRAVENOUS

## 2023-04-02 MED ORDER — ONDANSETRON HCL 4 MG/2ML IJ SOLN
INTRAMUSCULAR | Status: AC
  Filled 2023-04-02: qty 2

## 2023-04-02 MED ORDER — PROPOFOL 10 MG/ML IV BOLUS
INTRAVENOUS | Status: AC
  Filled 2023-04-02: qty 20

## 2023-04-02 MED ORDER — CEFAZOLIN SODIUM-DEXTROSE 2-4 GM/100ML-% IV SOLN: 2.0000 g | INTRAVENOUS | Status: DC

## 2023-04-02 MED ORDER — NITROGLYCERIN IN D5W 200-5 MCG/ML-% IV SOLN
INTRAVENOUS | Status: AC
  Filled 2023-04-02: qty 250

## 2023-04-02 MED ORDER — MORPHINE SULFATE (PF) 2 MG/ML IV SOLN: 2.0000 mg | INTRAVENOUS | Status: DC | PRN

## 2023-04-02 SURGICAL SUPPLY — 66 items
ADH SKN CLS APL DERMABOND .7 (GAUZE/BANDAGES/DRESSINGS) ×1
AGENT HMST PWDR HDRLZ BVN CLGN (Miscellaneous) ×1 IMPLANT
APL PRP STRL LF DISP 70% ISPRP (MISCELLANEOUS) ×1
BAG DECANTER FOR FLEXI CONT (MISCELLANEOUS) ×1 IMPLANT
BLADE SURG 15 STRL LF DISP TIS (BLADE) ×1 IMPLANT
BLADE SURG 15 STRL SS (BLADE) ×1
BLADE SURG SZ11 CARB STEEL (BLADE) ×1 IMPLANT
BOOT SUTURE VASCULAR YLW (MISCELLANEOUS) ×1
BRUSH SCRUB EZ  4% CHG (MISCELLANEOUS) ×1
BRUSH SCRUB EZ 4% CHG (MISCELLANEOUS) ×1 IMPLANT
CHLORAPREP W/TINT 26 (MISCELLANEOUS) ×1 IMPLANT
CLAMP SUTURE YELLOW 5 PAIRS (MISCELLANEOUS) ×2 IMPLANT
COLLAGEN CELLERATERX 1 GRAM (Miscellaneous) IMPLANT
DERMABOND ADVANCED .7 DNX12 (GAUZE/BANDAGES/DRESSINGS) ×1 IMPLANT
DRAPE 3/4 80X56 (DRAPES) ×1 IMPLANT
DRAPE INCISE IOBAN 66X45 STRL (DRAPES) ×1 IMPLANT
DRAPE LAPAROTOMY 100X77 ABD (DRAPES) ×1 IMPLANT
DRESSING SURGICEL FIBRLLR 1X2 (HEMOSTASIS) ×1 IMPLANT
DRSG SURGICEL FIBRILLAR 1X2 (HEMOSTASIS) ×1
ELECT CAUTERY BLADE 6.4 (BLADE) ×1 IMPLANT
ELECT REM PT RETURN 9FT ADLT (ELECTROSURGICAL) ×1
ELECTRODE REM PT RTRN 9FT ADLT (ELECTROSURGICAL) ×1 IMPLANT
GAUZE 4X4 16PLY ~~LOC~~+RFID DBL (SPONGE) ×1 IMPLANT
GLOVE BIO SURGEON STRL SZ7 (GLOVE) ×1 IMPLANT
GLOVE SURG SYN 8.0 (GLOVE) ×1 IMPLANT
GLOVE SURG SYN 8.0 PF PI (GLOVE) ×1 IMPLANT
GOWN STRL REUS W/ TWL LRG LVL3 (GOWN DISPOSABLE) ×2 IMPLANT
GOWN STRL REUS W/ TWL XL LVL3 (GOWN DISPOSABLE) ×1 IMPLANT
GOWN STRL REUS W/TWL LRG LVL3 (GOWN DISPOSABLE) ×2
GOWN STRL REUS W/TWL XL LVL3 (GOWN DISPOSABLE) ×1
HOLDER FOLEY CATH W/STRAP (MISCELLANEOUS) ×1 IMPLANT
IV NS 500ML (IV SOLUTION) ×1
IV NS 500ML BAXH (IV SOLUTION) ×1 IMPLANT
KIT TURNOVER KIT A (KITS) ×1 IMPLANT
LABEL OR SOLS (LABEL) ×1 IMPLANT
LOOP VESSEL MAXI  1X406 RED (MISCELLANEOUS) ×2
LOOP VESSEL MAXI 1X406 RED (MISCELLANEOUS) ×2 IMPLANT
LOOP VESSEL MINI 0.8X406 BLUE (MISCELLANEOUS) ×1 IMPLANT
MANIFOLD NEPTUNE II (INSTRUMENTS) ×1 IMPLANT
NDL FILTER BLUNT 18X1 1/2 (NEEDLE) ×1 IMPLANT
NDL HYPO 25X1 1.5 SAFETY (NEEDLE) ×1 IMPLANT
NEEDLE FILTER BLUNT 18X1 1/2 (NEEDLE) ×1 IMPLANT
NEEDLE HYPO 25X1 1.5 SAFETY (NEEDLE) ×1 IMPLANT
NS IRRIG 500ML POUR BTL (IV SOLUTION) ×1 IMPLANT
PACK BASIN MAJOR ARMC (MISCELLANEOUS) ×1 IMPLANT
SHUNT CAROTID STR REINF 3.0X4. (MISCELLANEOUS) ×1 IMPLANT
SPONGE T-LAP 18X18 ~~LOC~~+RFID (SPONGE) ×2 IMPLANT
SUT MNCRL+ 5-0 UNDYED PC-3 (SUTURE) ×1 IMPLANT
SUT PROLENE 6 0 BV (SUTURE) ×8 IMPLANT
SUT PROLENE 7 0 BV 1 (SUTURE) ×6 IMPLANT
SUT SILK 2 0 (SUTURE) ×1
SUT SILK 2-0 18XBRD TIE 12 (SUTURE) ×1 IMPLANT
SUT SILK 3 0 (SUTURE) ×1
SUT SILK 3-0 18XBRD TIE 12 (SUTURE) ×1 IMPLANT
SUT SILK 4 0 (SUTURE) ×1
SUT SILK 4-0 18XBRD TIE 12 (SUTURE) ×1 IMPLANT
SUT VIC AB 3-0 SH 27 (SUTURE) ×1
SUT VIC AB 3-0 SH 27X BRD (SUTURE) ×1 IMPLANT
SYR 10ML LL (SYRINGE) ×1 IMPLANT
SYR 20ML LL LF (SYRINGE) ×1 IMPLANT
SYR 3ML LL SCALE MARK (SYRINGE) ×1 IMPLANT
TAG SUTURE CLAMP YLW 5PR (MISCELLANEOUS) ×1
TRAP FLUID SMOKE EVACUATOR (MISCELLANEOUS) ×1 IMPLANT
TRAY FOLEY MTR SLVR 16FR STAT (SET/KITS/TRAYS/PACK) ×1 IMPLANT
TUBING CONNECTING 10 (TUBING) IMPLANT
WATER STERILE IRR 500ML POUR (IV SOLUTION) ×1 IMPLANT

## 2023-04-02 NOTE — Anesthesia Procedure Notes (Signed)
Arterial Line Insertion Start/End5/06/2023 8:00 AM, 04/02/2023 8:02 AM Performed by: Yevette Edwards, MD, anesthesiologist  Patient location: Pre-op. Preanesthetic checklist: patient identified, IV checked, site marked, risks and benefits discussed, surgical consent, monitors and equipment checked, pre-op evaluation, timeout performed and anesthesia consent Lidocaine 1% used for infiltration radial was placed Catheter size: 20 G Hand hygiene performed  and maximum sterile barriers used   Attempts: 2 Procedure performed without using ultrasound guided technique. Following insertion, dressing applied. Post procedure assessment: normal and unchanged  Post procedure complications: unsuccessful attempts. Patient tolerated the procedure well with no immediate complications.

## 2023-04-02 NOTE — Plan of Care (Signed)
PMT following for needs. Patient is currently off unit with vascular surgery for L carotid endarterectomy. Will follow up tomorrow.

## 2023-04-02 NOTE — [place] FL2 (Signed)
[place] MEDICAID FL2 LEVEL OF CARE FORM     IDENTIFICATION  Patient Name: Richard Carter Birthdate: January 05, 1941 Sex: male Admission Date (Current Location): 03/27/2023  [facility] and [place] Number:  Chiropodist and Address:  [facility], [address] [place], [place] [address]      Provider Number: [id number]  Attending Physician Name and Address:  Gillis Santa, MD  Relative Name and Phone Number:       Current Level of Care: Hospital Recommended Level of Care: Skilled Nursing Facility Prior Approval Number:    Date Approved/Denied:   PASRR Number: [id number]  Discharge Plan: SNF    Current Diagnoses: Patient Active Problem List   Diagnosis Date Noted   Stroke [facility]) 03/27/2023   Stenosis of left carotid artery 02/20/2023   Altered mental status 02/20/2023   Acute stroke due to ischemia (HCC) 02/17/2023   Stroke-like symptom 02/16/2023   Leukocytosis 02/16/2023   Protein-calorie malnutrition, severe 05/28/2022   Dysphagia due to recent cerebrovascular accident 05/28/2022   Acute metabolic encephalopathy 05/27/2022   History of stroke 05/27/2022   Aspiration pneumonia (HCC) 04/23/2022   Nicotine dependence    Elevated troponin    Acute CVA (cerebrovascular accident) (HCC) 05/07/2021   Non compliance w medication regimen 05/07/2021   Dementia (HCC) 05/07/2021   AF (paroxysmal atrial fibrillation) (HCC) 05/07/2021   HTN (hypertension) 11/29/2020   Alcohol abuse 11/29/2020   AKI (acute kidney injury) (HCC) 11/29/2020   Atrial flutter with rapid ventricular response (HCC) 11/29/2020   Seizures (HCC) 02/22/2020   Pubic ramus fracture (HCC) 07/29/2019   Acetabular fracture (HCC) 07/29/2019   HLD (hyperlipidemia) 07/29/2019   GERD (gastroesophageal reflux disease) 07/29/2019   UTI (urinary tract infection) 07/29/2019    Orientation RESPIRATION BLADDER Height & Weight     Self, Place  O2 (Nasal Cannula 2 L.) Continent,  Indwelling catheter Weight: 146 lb 2.6 oz (66.3 kg) Height:  5\' 7"  (170.2 cm)  BEHAVIORAL SYMPTOMS/MOOD NEUROLOGICAL BOWEL NUTRITION STATUS   (None)  (Dementia, History of seizures) Incontinent Diet (Follow for discharge recommendations. Currently NPO.)  AMBULATORY STATUS COMMUNICATION OF NEEDS Skin   Limited Assist Verbally Bruising, Surgical wounds, Other (Comment) (Erythema/redness, petechiae. Incision on right side of neck: Dermabond.)                       Personal Care Assistance Level of Assistance  Bathing, Feeding, Dressing Bathing Assistance: Limited assistance Feeding assistance: Limited assistance Dressing Assistance: Limited assistance     Functional Limitations Info  Sight, Hearing, Speech Sight Info: Adequate Hearing Info: Adequate Speech Info: Impaired (Slurred/Dysarthria)    SPECIAL CARE FACTORS FREQUENCY  PT (By licensed PT), OT (By licensed OT), Speech therapy     PT Frequency: 5 x week OT Frequency: 5 x week     Speech Therapy Frequency: 5 x week      Contractures Contractures Info: Not present    Additional Factors Info  Code Status, Allergies Code Status Info: DNR Allergies Info: NKDA           Current Medications (04/02/2023):  This is the current hospital active medication list Current Facility-Administered Medications  Medication Dose Route Frequency Provider Last Rate Last Admin   0.9 %  sodium chloride infusion (Manually program via Guardrails IV Fluids)   Intravenous Once Pace, Brien R, NP       0.9 %  sodium chloride infusion  500 mL Intravenous Once PRN Schnier, Latina Craver, MD  0.9 % NaCl with KCl 20 mEq/ L  infusion   Intravenous Continuous Schnier, Latina Craver, MD 75 mL/hr at 04/02/23 1238 New Bag at 04/02/23 1238   acetaminophen (TYLENOL) tablet 325-650 mg  325-650 mg Oral Q4H PRN Schnier, Latina Craver, MD       Or   acetaminophen (TYLENOL) suppository 325-650 mg  325-650 mg Rectal Q4H PRN Schnier, Latina Craver, MD       alum & mag  hydroxide-simeth (MAALOX/MYLANTA) 200-200-20 MG/5ML suspension 15-30 mL  15-30 mL Oral Q2H PRN Schnier, Latina Craver, MD       amLODipine (NORVASC) tablet 5 mg  5 mg Oral Daily Schnier, Latina Craver, MD   5 mg at 04/01/23 0952   [START ON 04/04/2023] ascorbic acid (VITAMIN C) tablet 500 mg  500 mg Oral Daily Schnier, Latina Craver, MD       [START ON 04/03/2023] aspirin EC tablet 81 mg  81 mg Oral Q0600 Schnier, Latina Craver, MD       atorvastatin (LIPITOR) tablet 20 mg  20 mg Oral QPM Schnier, Latina Craver, MD   20 mg at 04/01/23 1721   ceFAZolin (ANCEF) IVPB 2g/100 mL premix  2 g Intravenous Q8H Schnier, Latina Craver, MD 200 mL/hr at 04/02/23 1234 2 g at 04/02/23 1234   Chlorhexidine Gluconate Cloth 2 % PADS 6 each  6 each Topical Daily Schnier, Latina Craver, MD   6 each at 04/02/23 1200   cyanocobalamin (VITAMIN B12) injection 1,000 mcg  1,000 mcg Intramuscular Daily Schnier, Latina Craver, MD   1,000 mcg at 04/01/23 4098   Followed by   Melene Muller ON 04/08/2023] cyanocobalamin (VITAMIN B12) tablet 1,000 mcg  1,000 mcg Oral Daily Schnier, Latina Craver, MD       [START ON 04/03/2023] docusate sodium (COLACE) capsule 100 mg  100 mg Oral Daily Schnier, Latina Craver, MD       fentaNYL (SUBLIMAZE) injection 12.5 mcg  12.5 mcg Intravenous Once PRN Rolla Plate R, NP       ferric gluconate (FERRLECIT) 250 mg in sodium chloride 0.9 % 250 mL IVPB  250 mg Intravenous Daily Schnier, Latina Craver, MD   Stopped at 04/01/23 1640   food thickener (SIMPLYTHICK (NECTAR/LEVEL 2/MILDLY THICK)) 1 packet  1 packet Oral PRN Schnier, Latina Craver, MD       guaiFENesin-dextromethorphan (ROBITUSSIN DM) 100-10 MG/5ML syrup 15 mL  15 mL Oral Q4H PRN Schnier, Latina Craver, MD       heparin ADULT infusion 100 units/mL (25000 units/233mL)  250 Units/hr Intravenous Continuous Schnier, Latina Craver, MD       hydrALAZINE (APRESOLINE) injection 5 mg  5 mg Intravenous Q2H PRN Schnier, Latina Craver, MD       hydrALAZINE (APRESOLINE) injection 5 mg  5 mg Intravenous Q20 Min PRN Schnier,  Latina Craver, MD       HYDROmorphone (DILAUDID) injection 1 mg  1 mg Intravenous Once PRN Marcie Bal, NP       [START ON 04/04/2023] iron polysaccharides (NIFEREX) capsule 150 mg  150 mg Oral Daily Schnier, Latina Craver, MD       labetalol (NORMODYNE) injection 10 mg  10 mg Intravenous Q10 min PRN Schnier, Latina Craver, MD       levETIRAcetam (KEPPRA) tablet 500 mg  500 mg Oral Q12H Schnier, Latina Craver, MD   500 mg at 04/01/23 2121   Or   levETIRAcetam (KEPPRA) IVPB 500 mg/100 mL premix  500 mg Intravenous Q12H Schnier, Latina Craver, MD  LORazepam (ATIVAN) injection 1 mg  1 mg Intravenous Q2H PRN Schnier, Latina Craver, MD       magnesium hydroxide (MILK OF MAGNESIA) suspension 30 mL  30 mL Oral Daily PRN Schnier, Latina Craver, MD       metoprolol tartrate (LOPRESSOR) injection 2-5 mg  2-5 mg Intravenous Q2H PRN Schnier, Latina Craver, MD       morphine (PF) 2 MG/ML injection 2-5 mg  2-5 mg Intravenous Q1H PRN Schnier, Latina Craver, MD       ondansetron [facility]) injection 4 mg  4 mg Intravenous Q6H PRN Pace, Brien R, NP       oxyCODONE (Oxy IR/ROXICODONE) immediate release tablet 5-10 mg  5-10 mg Oral Q4H PRN Schnier, Latina Craver, MD       pantoprazole (PROTONIX) injection 40 mg  40 mg Intravenous Q24H Schnier, Latina Craver, MD   40 mg at 04/01/23 0953   pantoprazole (PROTONIX) injection 40 mg  40 mg Intravenous QHS Schnier, Latina Craver, MD       phenol (CHLORASEPTIC) mouth spray 1 spray  1 spray Mouth/Throat PRN Schnier, Latina Craver, MD       sodium chloride flush (NS) 0.9 % injection 10-40 mL  10-40 mL Intracatheter Q12H Schnier, Latina Craver, MD   10 mL at 04/01/23 1008   sodium chloride flush (NS) 0.9 % injection 10-40 mL  10-40 mL Intracatheter PRN Schnier, Latina Craver, MD       sorbitol 70 % solution 30 mL  30 mL Oral Daily PRN Schnier, Latina Craver, MD         Discharge Medications: Please see discharge summary for a list of discharge medications.  Relevant Imaging Results:  Relevant Lab Results:   Additional  Information SS#: [id number]  Margarito Liner, LCSW

## 2023-04-02 NOTE — Interval H&P Note (Signed)
History and Physical Interval Note:  04/02/2023 7:29 AM  Richard Carter  has presented today for surgery, with the diagnosis of Carotid Stenosis with CVA.  The various methods of treatment have been discussed with the patient and family. After consideration of risks, benefits and other options for treatment, the patient has consented to  Procedure(s): ENDARTERECTOMY CAROTID (Left) as a surgical intervention.  The patient's history has been reviewed, patient examined, no change in status, stable for surgery.  I have reviewed the patient's chart and labs.  Questions were answered to the patient's satisfaction.     [name]

## 2023-04-02 NOTE — Anesthesia Procedure Notes (Signed)
Procedure Name: Intubation Date/Time: 04/02/2023 7:50 AM  Performed by: Monico Hoar, CRNAPre-anesthesia Checklist: Patient identified, Patient being monitored, Timeout performed, Emergency Drugs available and Suction available Patient Re-evaluated:Patient Re-evaluated prior to induction Oxygen Delivery Method: Circle system utilized Preoxygenation: Pre-oxygenation with 100% oxygen Induction Type: IV induction Ventilation: Mask ventilation without difficulty Laryngoscope Size: Mac and 4 Grade View: Grade I Tube type: Oral Tube size: 7.0 mm Number of attempts: 1 Airway Equipment and Method: Stylet Placement Confirmation: ETT inserted through vocal cords under direct vision, positive ETCO2 and breath sounds checked- equal and bilateral Secured at: 21 cm Tube secured with: Tape Dental Injury: Teeth and Oropharynx as per pre-operative assessment

## 2023-04-02 NOTE — TOC Progression Note (Signed)
Transition of Care [facility]) - Progression Note    Patient Details  Name: Richard Carter MRN: [id number] Date of Birth: 06-06-1941  Transition of Care [facility]) CM/SW Contact  Allena Katz, LCSW Phone Number: 04/02/2023, 8:46 AM  Clinical Narrative:   CSW spoke with patients POA chris and his sister who report that they would like [facility] or [facility]. CSW to send referrals out for patient.     Expected Discharge Plan: Home w Home Health Services Barriers to Discharge: Continued Medical Work up  Expected Discharge Plan and Services       Living arrangements for the past 2 months: Single Family Home                             HH Agency: [facility] Date [facility] Agency Contacted: 03/29/23   Representative spoke with at [facility] Agency: [name]   Social Determinants of Health (SDOH) Interventions SDOH Screenings   Food Insecurity: No Food Insecurity (02/16/2023)  Housing: Low Risk  (02/16/2023)  Transportation Needs: No Transportation Needs (02/16/2023)  Utilities: Not At Risk (02/16/2023)  Tobacco Use: Medium Risk (04/02/2023)    Readmission Risk Interventions    04/21/2022    1:55 PM  Readmission Risk Prevention Plan  Post Dischage Appt Complete  Medication Screening Complete  Transportation Screening Complete

## 2023-04-02 NOTE — Progress Notes (Signed)
[facility] Progress Note  Patient: Richard Carter    ZOX:[id number]  DOA: 03/27/2023     Date of Service: the patient was seen and examined on 04/02/2023  Chief Complaint  Patient presents with   Aphasia   Brief hospital course: Richard Carter is a 82 y.o. 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  Found to have recurrent stroke.  Neurology and vascular surgery involved.  Plan for left CEA on 5/7.  Currently on IV heparin.     Assessment and Plan:    Stenosis of left carotid artery:  pt has high-grade left carotid arteries stenosis -consulted Dr. Wyn Quaker, vascular surgery, patient was already following as an outpatient.   S/p left carotid endarterectomy done on 04/02/2023. Continue heparin IV infusion   Acute ischemic stroke MRI-brain showed acute infarct in the postcentral gyrus on the left. Neurology following. Carotid Dopplers unchanged from prior stenosis. Currently allowing permissive hypertension. Patient was on Eliquis.  switched to heparin.  Cholesterol medications restarted as diet has been upgraded. PT OT were also consulted. Monitor on telemetry. Recently had an echocardiogram with preserved EF without any wall motion abnormality. Palliative care has been consulted.    Dysphagia Patient had multiple evaluation by SLP, recommended dysphagia 1 diet with nectar thick liquids.  Patient needs assistance with feeding, instructions were given to RN by speech therapist.     paroxysmal atrial fibrillation Heart rate well-controlled Eliquis on hold and switch to heparin due to plans for CEA. Discontinued beta-blocker due to bradycardia  Started amlodipine 5 mg p.o. daily   seizures (HCC) -Seizure precaution -As needed Ativan for seizure -Continue  home Keppra, now IV.   HTN (hypertension): Allowed permissive hypertension for 48 hrs -Resumed amlodipine Continue to hold Cozaar Discontinued metoprolol due to bradycardia   HLD: - Continue his home atorvastatin LDL <70   Dementia (HCC): No behavior disturbance -Fall precaution  Iron deficiency anemia, transferrin saturation 6% Started ferric gluconate 250 mg IV daily for 4 doses, followed by oral supplement with vitamin C. Folic acid level 8.7, within normal range Monitor H&H and transfuse if hemoglobin less than 7  Vitamin B12 deficiency, B12 level 165, goal >400, started vitamin B12 1000 mcg IM injection daily during hospital stay, followed by oral supplement.  Follow-up PCP to repeat vitamin B12 level after 3 to 6 months.   Body mass index is 21.44 kg/m.  Interventions:        Diet: Dysphagia 1 diet with nectar thick liquids DVT Prophylaxis: Therapeutic Anticoagulation with heparin IV infusion    Advance goals of care discussion: DNR  Family Communication: family was not present at bedside, at the time of interview.  The pt provided permission to discuss medical plan with the family. Opportunity was given to ask question and all questions were answered satisfactorily.   Disposition:  Pt is from Home, admitted with stroke and found to have left carotid stenosis, needs CEA by vascular surgery plan is on 5/8, still on Hep gtt, which precludes a safe discharge. Discharge to SNF, TOC following for placement  Subjective: No significant events overnight, patient was seen after carotid endarterectomy, tolerated procedure well.  Denies any significant pain.  Resting comfortably.  Physical Exam: General: NAD, lying comfortably Appear in no distress, affect appropriate Eyes: PERRLA ENT: Oral Mucosa Clear, moist  Neck: no  JVD,  Cardiovascular: S1 and S2 Present, no Murmur,  Respiratory: good respiratory effort, Bilateral Air entry equal and Decreased, no Crackles, no  wheezes Abdomen: Bowel Sound present, Soft and no tenderness,  Skin: no rashes Extremities: no Pedal edema, no calf tenderness Neurologic: Residual weakness on the right side due to prior stroke. Gait not checked due to patient safety concerns  Vitals:   04/02/23 1515 04/02/23 1530 04/02/23 1545 04/02/23 1600  BP:      Pulse: (!) 43 (!) 42 (!) 44 (!) 49  Resp: 13 11 17 16   Temp:      TempSrc:      SpO2: 95% 96% 93% (!) 89%  Weight:      Height:        Intake/Output Summary (Last 24 hours) at 04/02/2023 1719 Last data filed at 04/02/2023 1058 Gross per 24 hour  Intake 1859.93 ml  Output 1070 ml  Net 789.93 ml   Filed Weights   03/28/23 1600 04/02/23 0700 04/02/23 1058  Weight: 62.1 kg 62.1 kg 66.3 kg    Data Reviewed: I have personally reviewed and interpreted daily labs, tele strips, imagings as discussed above. I reviewed all nursing notes, pharmacy notes, vitals, pertinent old records I have discussed plan of care as described above with RN and patient/family.  CBC: Recent Labs  Lab 03/27/23 1409 03/28/23 0547 03/29/23 0428 03/30/23 0414 03/31/23 0535 04/01/23 0237 04/02/23 0605 04/02/23 1151  WBC 8.9   < > 7.5 6.6 7.6 10.1 8.5  --   NEUTROABS 6.2  --   --   --   --   --   --   --   HGB 8.3*   < > 8.2* 7.8* 7.8* 7.2* 7.0* 6.8*  HCT 28.0*   < > 26.8* 25.0* 25.3* 22.9* 22.4* 22.5*  MCV 84.6   < > 83.0 82.0 82.1 81.2 82.7  --   PLT 297   < > 301 305 260 231 199  --    < > = values in this interval not displayed.   Basic Metabolic Panel: Recent Labs  Lab 03/28/23 0547 03/29/23 0428 03/30/23 0414 04/01/23 0237 04/02/23 0605  NA 139 139 139 139 141  K 3.3* 3.9 3.7 4.2 3.9  CL 111 114* 113* 110 115*  CO2 21* 19* 20* 21* 22  GLUCOSE 89 82 74 120* 93  BUN 18 20 19  34* 29*  CREATININE 1.08 1.05 1.05 1.38* 1.07  CALCIUM 9.2 9.8 9.5 9.4 9.3  MG  --  1.7  --  1.6* 2.1  PHOS  --   --   --  3.6 2.9    Studies: No results found.  Scheduled Meds:  sodium  chloride   Intravenous Once   amLODipine  5 mg Oral Daily   [START ON 04/04/2023] ascorbic acid  500 mg Oral Daily   [START ON 04/03/2023] aspirin EC  81 mg Oral Q0600   atorvastatin  20 mg Oral QPM   Chlorhexidine Gluconate Cloth  6 each Topical Daily   cyanocobalamin  1,000 mcg Intramuscular Daily   Followed by   Melene Muller ON 04/08/2023] vitamin B-12  1,000 mcg Oral Daily   [START ON 04/03/2023] docusate sodium  100 mg Oral Daily   [START ON 04/04/2023] iron polysaccharides  150 mg Oral Daily   levETIRAcetam  500 mg Oral Q12H   pantoprazole (PROTONIX) IV  40 mg Intravenous Q24H   pantoprazole (PROTONIX) IV  40 mg Intravenous QHS   sodium chloride flush  10-40 mL Intracatheter Q12H   Continuous Infusions:  sodium chloride     0.9 % NaCl with KCl 20 mEq / L 75 mL/hr at 04/02/23 1238    ceFAZolin (ANCEF) IV 2 g (04/02/23 1234)   ferric gluconate (FERRLECIT) IVPB Stopped (04/01/23 1640)   heparin     levETIRAcetam     PRN Meds: sodium chloride, acetaminophen **OR** acetaminophen, alum & mag hydroxide-simeth, fentaNYL (SUBLIMAZE) injection, food thickener, guaiFENesin-dextromethorphan, hydrALAZINE, hydrALAZINE, HYDROmorphone (DILAUDID) injection, labetalol, LORazepam, magnesium hydroxide, metoprolol tartrate, morphine injection, ondansetron (ZOFRAN) IV, oxyCODONE, phenol, sodium chloride flush, sorbitol  Time spent: 35 minutes  Author: Gillis Santa. MD [facility] 04/02/2023 5:19 PM  To reach On-call, see care teams to locate the attending and reach out to them via [email or web address]. If 7PM-7AM, please contact night-coverage If you still have difficulty reaching the attending provider, please page the [facility] (Director on Call) for [facility] on amion for assistance.

## 2023-04-02 NOTE — Transfer of Care (Signed)
Immediate Anesthesia Transfer of Care Note  Patient: Richard Carter  Procedure(s) Performed: ENDARTERECTOMY CAROTID (Left)  Patient Location: PACU  Anesthesia Type:General  Level of Consciousness: awake  Airway & Oxygen Therapy: Patient Spontanous Breathing and Patient connected to face mask oxygen  Post-op Assessment: Report given to RN and Post -op Vital signs reviewed and stable  Post vital signs: Reviewed and stable  Last Vitals:  Vitals Value Taken Time  BP 132/91 04/02/23 1015  Temp    Pulse 54 04/02/23 1022  Resp 14 04/02/23 1022  SpO2 98 % 04/02/23 1022  Vitals shown include unvalidated device data.  Last Pain:  Vitals:   04/02/23 0700  TempSrc: Temporal  PainSc: 0-No pain         Complications: No notable events documented.

## 2023-04-02 NOTE — Anesthesia Preprocedure Evaluation (Addendum)
Anesthesia Evaluation  Patient identified by MRN, date of birth, ID band Patient awake    Reviewed: Allergy & Precautions, H&P , NPO status , Patient's Chart, lab work & pertinent test results, reviewed documented beta blocker date and time   Airway Mallampati: II  TM Distance: >3 FB Neck ROM: full    Dental  (+) Teeth Intact   Pulmonary neg pulmonary ROS, pneumonia, resolved, former smoker   Pulmonary exam normal        Cardiovascular Exercise Tolerance: Poor hypertension, On Medications (-) angina (-) CAD, (-) Past MI, (-) Cardiac Stents and (-) CABG negative cardio ROS Normal cardiovascular exam Rhythm:regular Rate:Normal     Neuro/Psych Seizures -,  PSYCHIATRIC DISORDERS     Dementia CVA negative neurological ROS  negative psych ROS   GI/Hepatic negative GI ROS, Neg liver ROS,GERD  Medicated,,  Endo/Other  negative endocrine ROS    Renal/GU Renal diseasenegative Renal ROS  negative genitourinary   Musculoskeletal   Abdominal   Peds  Hematology  (+) Blood dyscrasia, anemia   Anesthesia Other Findings Past Medical History: No date: Alcohol abuse No date: Dementia (HCC) No date: GERD (gastroesophageal reflux disease) No date: HLD (hyperlipidemia) No date: HTN (hypertension) No date: Seizures (HCC) No date: Stroke [facility]) Past Surgical History: No date: skin cyst removal BMI    Body Mass Index: 21.44 kg/m     Reproductive/Obstetrics negative OB ROS                             Anesthesia Physical Anesthesia Plan  ASA: 4  Anesthesia Plan: General ETT   Post-op Pain Management:    Induction:   PONV Risk Score and Plan: 3  Airway Management Planned:   Additional Equipment:   Intra-op Plan:   Post-operative Plan:   Informed Consent: I have reviewed the patients History and Physical, chart, labs and discussed the procedure including the risks, benefits and alternatives  for the proposed anesthesia with the patient or authorized representative who has indicated his/her understanding and acceptance.     Dental Advisory Given  Plan Discussed with: CRNA  Anesthesia Plan Comments: (Situation discussed with sister that patient is extremely high risk for surgery in general. HB noted and of obvious concern. Sister is aware. ja)       Anesthesia Quick Evaluation

## 2023-04-02 NOTE — Op Note (Signed)
[facility]   OPERATIVE NOTE  PROCEDURE:   1.  Left carotid endarterectomy with Primary Closure  PRE-OPERATIVE DIAGNOSIS: 1.  Critical stenosis of the left carotid Artery 2.  Left hemispheric CVA with recent recurrent TIA  POST-OPERATIVE DIAGNOSIS: same as above   SURGEON: Renford Dills, MD  ASSISTANT(S): Jannetta Quint. RNFA  ANESTHESIA: general  ESTIMATED BLOOD LOSS: 50 cc  FINDING(S): 1.  Extensive calcified carotid plaque.  SPECIMEN(S):  Carotid plaque (sent to Pathology)  INDICATIONS:   Richard Carter is a 82 y.o. y.o. male who presents with left carotid stenosis of >90%.  The risks, benefits, and alternatives to carotid endarterectomy were discussed with the patient. The differences between carotid stenting and carotid endarterectomy were reviewed.  The patient voiced understanding and appears to be aware that the risks of carotid endarterectomy include but are not limited to: bleeding, infection, stroke, myocardial infarction, death, cranial nerve injuries both temporary and permanent, neck hematoma, possible airway compromise, labile blood pressure post-operatively, cerebral hyperperfusion syndrome, and possible need for additional interventions in the future. The patient is aware of the risks and agrees to proceed forward with the procedure.  DESCRIPTION: After full informed written consent was obtained from the patient, the patient was brought back to the operating room and placed supine upon the operating table.  Prior to induction, the patient received IV antibiotics.  After obtaining adequate anesthesia, the patient was placed a supine position with a shoulder roll in place and the patient's neck slightly hyperextended and rotated away from the surgical site.  The patient was prepped in the standard fashion for a carotid endarterectomy.    A first assistant was required to provide a safe and appropriate environment for executing the surgery.   The assistant was integral in providing retraction, exposure, running suture providing suction and in the closing process.  The skin incision was made in the left neck anterior to the sternocleidomastoid muscle and dissected down through the subcutaneous tissue.  The platysmas was opened with electrocautery.  The internal jugular vein and facial vein were identified.  The facial vein is ligated and divided between 2-0 silk ties.  The omohyoid was identified in the common carotid artery exposed at this level. The dissection was there in carried out along the carotid artery in a cranial direction.  The dissection was then carried along periadventitial plane along the common carotid artery up to the bifurcation. The external carotid artery was identified. Vessel loops were then placed around the external carotid artery as well as the superior thyroid artery. In the process of this dissection, the hypoglossal nerve was identified and protected from harm.  The internal carotid artery was then dissected circumferentially just beyond an area in the internal carotid artery distal to the plaque.    At this point, we gave the patient 7000 units of intravenous heparin and this was allowed to circulate for several minutes.  The common carotid artery followed by the external carotid and then the internal carotid artery were clamped.  Arteriotomy was made in the common carotid artery with a 11 blade, and extended the arteriotomy with a Potts scissor down into the common carotid artery, then the arteriotomy was carried through the bifurcation into the internal carotid artery until I reached an area that was not diseased.  At this point, a Sundt shunt was placed without difficulty.  The endarterectomy was begun in the common carotid artery with a Runner, broadcasting/film/video and carried this dissection down into  the common carotid artery circumferentially.  Then I transected the plaque at a segment where it was adherent and transected  the plaque with Potts scissors.  I then carried this dissection up into the external carotid artery.  The plaque was extracted by unclamping the external carotid artery and performing an eversion endarterectomy.  The dissection was then carried into the internal carotid artery where a  feathered end point was created.  The plaque was passed off the field as a specimen.  The distal endpoint was tacked down with 6 interrupted 7-0 Prolene sutures.  The arteriotomy was then closed using running 6-0 Prolene beginning at the distal margin and running the second 6-0 Prolene from the common carotid end.  Prior to completing the primary repair, the shunt was removed, the internal carotid artery was flushed and there was excellent backbleeding.  The carotid artery repair was flushed with heparinized saline and then the primary repair was completed in the usual fashion.  The flow was then reestablished first to the external carotid artery and then the internal carotid artery to prevent distal embolization.   Several minutes of pressure were held and 6-0 Prolene sutures were used as need for hemostasis.  At this point, I placed Surgicel and Evicel topical hemostatic agents.  There was no more active bleeding in the surgical site.  The sternocleidomastoid space was closed with three interrupted 3-0 Vicryl sutures. I then reapproximated the platysma muscle with a running stitch of 3-0 Vicryl.  The skin was then closed with a running subcuticular 4-0 Monocryl.  The skin was then cleaned, dried and Dermabond was used to reinforce the skin closure.  The patient awakened and was taken to the recovery room in stable condition, following commands and moving all four extremities without any apparent deficits.    COMPLICATIONS: none  CONDITION: stable  Richard Carter 04/02/2023<10:15 AM

## 2023-04-03 ENCOUNTER — Encounter: Payer: Self-pay | Admitting: Vascular Surgery

## 2023-04-03 DIAGNOSIS — E440 Moderate protein-calorie malnutrition: Secondary | ICD-10-CM | POA: Insufficient documentation

## 2023-04-03 DIAGNOSIS — I6359 Cerebral infarction due to unspecified occlusion or stenosis of other cerebral artery: Secondary | ICD-10-CM | POA: Diagnosis not present

## 2023-04-03 DIAGNOSIS — Z7189 Other specified counseling: Secondary | ICD-10-CM

## 2023-04-03 DIAGNOSIS — Z9889 Other specified postprocedural states: Secondary | ICD-10-CM

## 2023-04-03 LAB — BLOOD GAS, ARTERIAL
Acid-base deficit: 5.4 mmol/L — ABNORMAL HIGH (ref 0.0–2.0)
Bicarbonate: 20 mmol/L (ref 20.0–28.0)
O2 Saturation: 98.9 %
Patient temperature: 37
pCO2 arterial: 38 mmHg (ref 32–48)
pH, Arterial: 7.33 — ABNORMAL LOW (ref 7.35–7.45)
pO2, Arterial: 88 mmHg (ref 83–108)

## 2023-04-03 LAB — CBC
HCT: 23.1 % — ABNORMAL LOW (ref 39.0–52.0)
HCT: 26.3 % — ABNORMAL LOW (ref 39.0–52.0)
Hemoglobin: 7.2 g/dL — ABNORMAL LOW (ref 13.0–17.0)
Hemoglobin: 8 g/dL — ABNORMAL LOW (ref 13.0–17.0)
MCH: 25.4 pg — ABNORMAL LOW (ref 26.0–34.0)
MCH: 25.9 pg — ABNORMAL LOW (ref 26.0–34.0)
MCHC: 31.2 g/dL (ref 30.0–36.0)
MCHC: 30.4 g/dL (ref 30.0–36.0)
MCV: 81.6 fL (ref 80.0–100.0)
MCV: 85.1 fL (ref 80.0–100.0)
Platelets: 170 10*3/uL (ref 150–400)
Platelets: 165 10*3/uL (ref 150–400)
RBC: 2.83 MIL/uL — ABNORMAL LOW (ref 4.22–5.81)
RBC: 3.09 MIL/uL — ABNORMAL LOW (ref 4.22–5.81)
RDW: 18 % — ABNORMAL HIGH (ref 11.5–15.5)
RDW: 17.4 % — ABNORMAL HIGH (ref 11.5–15.5)
WBC: 13.3 10*3/uL — ABNORMAL HIGH (ref 4.0–10.5)
WBC: 11.3 10*3/uL — ABNORMAL HIGH (ref 4.0–10.5)
nRBC: 0 % (ref 0.0–0.2)
nRBC: 0.2 % (ref 0.0–0.2)

## 2023-04-03 LAB — BPAM RBC
Blood Product Expiration Date: 202405102359
Blood Product Expiration Date: 202406132359
ISSUE DATE / TIME: 202405091414
Unit Type and Rh: 5100

## 2023-04-03 LAB — BASIC METABOLIC PANEL
Anion gap: 6 (ref 5–15)
BUN: 29 mg/dL — ABNORMAL HIGH (ref 8–23)
CO2: 18 mmol/L — ABNORMAL LOW (ref 22–32)
Calcium: 8.9 mg/dL (ref 8.9–10.3)
Chloride: 115 mmol/L — ABNORMAL HIGH (ref 98–111)
Creatinine, Ser: 1.17 mg/dL (ref 0.61–1.24)
GFR, Estimated: >60 mL/min (ref >60)
Glucose, Bld: 109 mg/dL — ABNORMAL HIGH (ref 70–99)
Potassium: 5.4 mmol/L — ABNORMAL HIGH (ref 3.5–5.1)
Sodium: 139 mmol/L (ref 135–145)

## 2023-04-03 LAB — SURGICAL PATHOLOGY

## 2023-04-03 LAB — TYPE AND SCREEN
ABO/RH(D): O POS
Unit division: 0

## 2023-04-03 LAB — APTT: aPTT: 51 seconds — ABNORMAL HIGH (ref 24–36)

## 2023-04-03 LAB — HEPARIN LEVEL (UNFRACTIONATED): Heparin Unfractionated: 0.14 IU/mL — ABNORMAL LOW (ref 0.30–0.70)

## 2023-04-03 LAB — MAGNESIUM: Magnesium: 1.7 mg/dL (ref 1.7–2.4)

## 2023-04-03 LAB — PHOSPHORUS: Phosphorus: 3.2 mg/dL (ref 2.5–4.6)

## 2023-04-03 LAB — POTASSIUM: Potassium: 5.2 mmol/L — ABNORMAL HIGH (ref 3.5–5.1)

## 2023-04-03 LAB — PREPARE RBC (CROSSMATCH)

## 2023-04-03 MED ORDER — SODIUM ZIRCONIUM CYCLOSILICATE 5 G PO PACK
10.0000 g | PACK | Freq: Three times a day (TID) | ORAL | Status: AC
  Administered 2023-04-03 (×2): 10 g via ORAL
  Filled 2023-04-03 (×2): qty 2

## 2023-04-03 MED ORDER — SODIUM CHLORIDE 0.9% IV SOLUTION: Freq: Once | INTRAVENOUS | Status: DC

## 2023-04-03 MED ORDER — NEPRO/CARBSTEADY PO LIQD
237.0000 mL | Freq: Three times a day (TID) | ORAL | Status: DC
  Administered 2023-04-03 – 2023-04-07 (×8): 237 mL via ORAL

## 2023-04-03 MED ORDER — SODIUM CHLORIDE 0.9 % IV SOLN: INTRAVENOUS | Status: DC

## 2023-04-03 MED ORDER — SODIUM BICARBONATE 650 MG PO TABS
650.0000 mg | ORAL_TABLET | Freq: Three times a day (TID) | ORAL | Status: AC
  Administered 2023-04-03 (×3): 650 mg via ORAL
  Filled 2023-04-03 (×3): qty 1

## 2023-04-03 MED ORDER — ADULT MULTIVITAMIN W/MINERALS CH
1.0000 | ORAL_TABLET | Freq: Every day | ORAL | Status: DC
  Administered 2023-04-04 – 2023-04-07 (×4): 1 via ORAL
  Filled 2023-04-03 (×4): qty 1

## 2023-04-03 MED ORDER — APIXABAN 5 MG PO TABS
5.0000 mg | ORAL_TABLET | Freq: Two times a day (BID) | ORAL | Status: DC
  Administered 2023-04-03 – 2023-04-07 (×9): 5 mg via ORAL
  Filled 2023-04-03 (×9): qty 1

## 2023-04-03 MED ORDER — HEPARIN BOLUS VIA INFUSION
1850.0000 [IU] | Freq: Once | INTRAVENOUS | Status: DC
  Filled 2023-04-03: qty 1850

## 2023-04-03 MED ORDER — SODIUM CHLORIDE 0.9 % IV BOLUS
500.0000 mL | Freq: Once | INTRAVENOUS | Status: AC
  Administered 2023-04-03: 500 mL via INTRAVENOUS

## 2023-04-03 NOTE — Anesthesia Postprocedure Evaluation (Signed)
Anesthesia Post Note  Patient: Richard Carter  Procedure(s) Performed: ENDARTERECTOMY CAROTID (Left)  Patient location during evaluation: PACU Anesthesia Type: General Level of consciousness: awake and alert Pain management: pain level controlled Vital Signs Assessment: post-procedure vital signs reviewed and stable Respiratory status: spontaneous breathing, nonlabored ventilation, respiratory function stable and patient connected to nasal cannula oxygen Cardiovascular status: blood pressure returned to baseline and stable Postop Assessment: no apparent nausea or vomiting Anesthetic complications: no   No notable events documented.   Last Vitals:  Vitals:   04/03/23 0700 04/03/23 0715  BP: (!) 113/55   Pulse: (!) 48 (!) 44  Resp: (!) 9 11  Temp:    SpO2: 100% 98%    Last Pain:  Vitals:   04/03/23 0400  TempSrc: Oral  PainSc: 0-No pain                 Yevette Edwards

## 2023-04-03 NOTE — Progress Notes (Addendum)
Initial Nutrition Assessment  DOCUMENTATION CODES:   Non-severe (moderate) malnutrition in context of social or environmental circumstances  INTERVENTION:   Nepro Shake po TID, each supplement provides 425 kcal and 19 grams protein  Magic cup TID with meals, each supplement provides 290 kcal and 9 grams of protein  MVI po daily   Pt at high refeed risk; recommend monitor potassium, magnesium and phosphorus labs daily until stable  Daily weights   NUTRITION DIAGNOSIS:   Moderate Malnutrition related to social / environmental circumstances (dementia, etoh abuse, CVA) as evidenced by moderate fat depletion, moderate muscle depletion.  GOAL:   Patient will meet greater than or equal to 90% of their needs  MONITOR:   PO intake, Supplement acceptance, Labs, Weight trends, Skin, I & O's  REASON FOR ASSESSMENT:   Rounds    ASSESSMENT:   82 y/o male with h/o stroke, seizures, HLD, HTN, dementia, PAF, GERD and etoh abuse who is admitted with stroke, dysphagia and high-grade left carotid artery stenosis s/p left carotid endarterectomy 04/02/2023.  Met with pt in room today. Pt is known to the nutrition department from previous admissions. Pt with fair appetite and oral intake at baseline. Pt is a poor historian. Pt reports that he is not feeling hungry today. RN reports that patient did not want breakfast this morning. Pt is requiring full assist with meals. RD will add supplements to help pt meet his estimated needs. Pt remains at high refeed risk. Per chart, pt is up ~10lbs since admission but appears weight stable pta. Pt +4.6L on his I & Os. Palliative care is following; pt does not want any feeding tubes.    Medications reviewed and include: vitamin C, aspirin, B12, colace, iron, MVI, protonix, Na Bicarbonate, lokelma, NaCl @50ml /hr  Labs reviewed: K 5.2(H), BUN 29(H), P 3.2 wnl, Mg 1.7 wnl Iron 26(L), TIBC 417, folate 8.7, B12 165(L)- 5/6 WBC- 13.3(H), Hgb 7.2(L), Hct  23.1(L) Cbgs- 106, 104 x 24 hrs  AIC 5.8(H)- 5/2  NUTRITION - FOCUSED PHYSICAL EXAM:  Flowsheet Row Most Recent Value  Orbital Region Mild depletion  Upper Arm Region Severe depletion  Thoracic and Lumbar Region Mild depletion  Buccal Region Mild depletion  Temple Region Moderate depletion  Clavicle Bone Region Moderate depletion  Clavicle and Acromion Bone Region Moderate depletion  Scapular Bone Region Moderate depletion  Dorsal Hand Moderate depletion  Patellar Region Severe depletion  Anterior Thigh Region Severe depletion  Posterior Calf Region Severe depletion  Edema (RD Assessment) Mild  Hair Reviewed  Eyes Reviewed  Mouth Reviewed  Skin Reviewed  Nails Reviewed   Diet Order:   Diet Order             DIET - DYS 1 Room service appropriate? Yes with Assist; Fluid consistency: Nectar Thick  Diet effective now                  EDUCATION NEEDS:   No education needs have been identified at this time  Skin:  Skin Assessment: Reviewed RN Assessment (ecchymosis, incision R neck)  Last BM:  5/6- type 7  Height:   Ht Readings from Last 1 Encounters:  04/02/23 5\' 7"  (1.702 m)    Weight:   Wt Readings from Last 1 Encounters:  04/02/23 66.3 kg    Ideal Body Weight:  67.2 kg  BMI:  Body mass index is 22.89 kg/m.  Estimated Nutritional Needs:   Kcal:  1800-2100kcal/day  Protein:  90-105g/day  Fluid:  1.7-2.0L/day  Baird Lyons  Kyandre Okray MS, RD, LDN Please refer to AMION for RD and/or RD on-call/weekend/after hours pager  

## 2023-04-03 NOTE — Progress Notes (Signed)
Daily Progress Note   Patient Name: Richard Carter       Date: 04/03/2023 DOB: 1940/12/17  Age: 82 y.o. MRN#: [id number] Attending Physician: Gillis Santa, MD Primary Care Physician: Marisue Ivan, MD Admit Date: 03/27/2023  Reason for Consultation/Follow-up: Establishing goals of care  Subjective: Notes and labs reviewed.  In to see patient.  Patient had endarterectomy yesterday.  This morning he is resting in bed in ICU.  No family at bedside.  He awakens very briefly and denies complaint and goes back to sleep.   H POA has requested DNR/DNI status, and no feeding tube.  Time for outcomes  Length of Stay: 7  Current Medications: Scheduled Meds:   sodium chloride   Intravenous Once   sodium chloride   Intravenous Once   amLODipine  5 mg Oral Daily   apixaban  5 mg Oral BID   [START ON 04/04/2023] ascorbic acid  500 mg Oral Daily   aspirin EC  81 mg Oral Q0600   atorvastatin  20 mg Oral QPM   Chlorhexidine Gluconate Cloth  6 each Topical Daily   cyanocobalamin  1,000 mcg Intramuscular Daily   Followed by   Melene Muller ON 04/08/2023] vitamin B-12  1,000 mcg Oral Daily   docusate sodium  100 mg Oral Daily   feeding supplement (NEPRO CARB STEADY)  237 mL Oral TID BM   [START ON 04/04/2023] iron polysaccharides  150 mg Oral Daily   levETIRAcetam  500 mg Oral Q12H   [START ON 04/04/2023] multivitamin with minerals  1 tablet Oral Daily   pantoprazole (PROTONIX) IV  40 mg Intravenous Q24H   pantoprazole (PROTONIX) IV  40 mg Intravenous QHS   sodium bicarbonate  650 mg Oral TID   sodium chloride flush  10-40 mL Intracatheter Q12H   sodium zirconium cyclosilicate  10 g Oral TID    Continuous Infusions:  sodium chloride     sodium chloride 50 mL/hr at 04/03/23 0859   ferric gluconate  (FERRLECIT) IVPB 135 mL/hr at 04/03/23 0859   levETIRAcetam Stopped (04/03/23 0848)    PRN Meds: sodium chloride, acetaminophen **OR** acetaminophen, alum & mag hydroxide-simeth, fentaNYL (SUBLIMAZE) injection, food thickener, guaiFENesin-dextromethorphan, hydrALAZINE, hydrALAZINE, HYDROmorphone (DILAUDID) injection, labetalol, LORazepam, magnesium hydroxide, metoprolol tartrate, morphine injection, ondansetron (ZOFRAN) IV, oxyCODONE, phenol, sodium chloride flush, sorbitol  Physical Exam  Constitutional:      Comments: Arouses to voice  Pulmonary:     Effort: Pulmonary effort is normal.             Vital Signs: BP 106/62   Pulse (!) 43   Temp 98.2 F (36.8 C) (Axillary)   Resp 19   Ht 5\' 7"  (1.702 m)   Wt 66.3 kg   SpO2 96%   BMI 22.89 kg/m  SpO2: SpO2: 96 % O2 Device: O2 Device: Nasal Cannula O2 Flow Rate: O2 Flow Rate (L/min): 2 L/min  Intake/output summary:  Intake/Output Summary (Last 24 hours) at 04/03/2023 1202 Last data filed at 04/03/2023 1610 Gross per 24 hour  Intake 2838.87 ml  Output 550 ml  Net 2288.87 ml   LBM: Last BM Date : 03/31/23 Baseline Weight: Weight: 62.1 kg Most recent weight: Weight: 66.3 kg    Patient Active Problem List   Diagnosis Date Noted   Stroke (HCC) 03/27/2023   Stenosis of left carotid artery 02/20/2023   Altered mental status 02/20/2023   Acute stroke due to ischemia (HCC) 02/17/2023   Stroke-like symptom 02/16/2023   Leukocytosis 02/16/2023   Protein-calorie malnutrition, severe 05/28/2022   Dysphagia due to recent cerebrovascular accident 05/28/2022   Acute metabolic encephalopathy 05/27/2022   History of stroke 05/27/2022   Aspiration pneumonia (HCC) 04/23/2022   Nicotine dependence    Elevated troponin    Acute CVA (cerebrovascular accident) (HCC) 05/07/2021   Non compliance w medication regimen 05/07/2021   Dementia (HCC) 05/07/2021   AF (paroxysmal atrial fibrillation) (HCC) 05/07/2021   HTN (hypertension) 11/29/2020    Alcohol abuse 11/29/2020   AKI (acute kidney injury) (HCC) 11/29/2020   Atrial flutter with rapid ventricular response (HCC) 11/29/2020   Seizures (HCC) 02/22/2020   Pubic ramus fracture (HCC) 07/29/2019   Acetabular fracture (HCC) 07/29/2019   HLD (hyperlipidemia) 07/29/2019   GERD (gastroesophageal reflux disease) 07/29/2019   UTI (urinary tract infection) 07/29/2019    Palliative Care Assessment & Plan     Recommendations/Plan: Continue current care.  Time for outcomes DNR/DNI.  No feeding tube H POA is Jennye Boroughs  Code Status:    Code Status Orders  (From admission, onward)           Start     Ordered   03/27/23 1621  Do not attempt resuscitation (DNR)  Continuous       Question Answer Comment  If patient has no pulse and is not breathing Do Not Attempt Resuscitation   If patient has a pulse and/or is breathing: Medical Treatment Goals COMFORT MEASURES: Keep clean/warm/dry, use medication by any route; positioning, wound care and other measures to relieve pain/suffering; use oxygen, suction/manual treatment of airway obstruction for comfort; do not transfer unless for comfort needs.   Consent: Discussion documented in EHR or advanced directives reviewed      03/27/23 1622           Code Status History     Date Active Date Inactive Code Status Order ID Comments User Context   02/16/2023 1616 02/21/2023 1821 DNR [id number]  Lovenia Kim, DO ED   05/27/2022 1224 05/29/2022 1950 DNR [id number]  Pennie Banter, DO ED   04/20/2022 1628 04/29/2022 1938 DNR [id number]  Lucile Shutters, MD ED   04/20/2022 1550 04/20/2022 1628 Full Code [id number]  Lucile Shutters, MD ED   05/08/2021 0856 05/11/2021 2019 DNR [id number]  Tresa Moore, MD Inpatient   05/07/2021 1416 05/08/2021 0855 Full Code [id number]  Lucile Shutters, MD ED   11/29/2020 1614 12/01/2020 2004 DNR [id number]  Kathrynn Running, MD ED   02/22/2020 1753 02/24/2020 2032 DNR [id number]  Tresa Moore, MD ED    02/22/2020 1537 02/22/2020 1753 Full Code [id number]  Tresa Moore, MD ED   07/29/2019 2305 08/04/2019 1654 Full Code [id number]  Oralia Manis, MD ED       Prognosis:  Unable to determine    Care plan was discussed with nurse  Thank you for allowing the Palliative Medicine Team to assist in the care of this patient.    Morton Stall, NP  Please contact Palliative Medicine Team phone at [phone] for questions and concerns.

## 2023-04-03 NOTE — Progress Notes (Signed)
       CROSS COVER NOTE  NAME: Richard Carter MRN: [id number] DOB : 1941/05/22    HPI/Events of Note   Report:hyperkalemia 5.4, on repeat 5.2  On review of chart:chem panel shows low bicarb    Assessment and  Interventions   Assessment: Abg pH7.33, low normal bicarb, normal CO2 - likely fluid  Plan: 500 NS bolus       Donnie Mesa NP [facility]

## 2023-04-03 NOTE — Consult Note (Signed)
ANTICOAGULATION CONSULT NOTE  Pharmacy Consult for IV Heparin Indication: atrial fibrillation ISO recent stroke  Patient Measurements: Height: 5\' 7"  (170.2 cm) Weight: 66.3 kg (146 lb 2.6 oz) IBW/kg (Calculated) : 66.1 Heparin Dosing Weight: 62.1  Labs: Recent Labs    04/01/23 0237 04/01/23 0409 04/01/23 2216 04/02/23 0605 04/02/23 1151 04/02/23 2044 04/03/23 0359 04/03/23 0529  HGB 7.2*  --   --  7.0* 6.8* 7.7* 7.2*  --   HCT 22.9*  --   --  22.4* 22.5* 25.3* 23.1*  --   PLT 231  --   --  199  --   --  170  --   APTT 146*   < > 125* 68*  --   --   --  51*  HEPARINUNFRC 0.66  --   --  0.20*  --   --   --  0.14*  CREATININE 1.38*  --   --  1.07  --   --  1.17  --    < > = values in this interval not displayed.    Estimated Creatinine Clearance: 44.7 mL/min (by C-G formula based on SCr of 1.17 mg/dL).  Medical History: Past Medical History:  Diagnosis Date   Alcohol abuse    Dementia (HCC)    GERD (gastroesophageal reflux disease)    HLD (hyperlipidemia)    HTN (hypertension)    Seizures (HCC)    Stroke (HCC)     Medications:  Apixaban 5 mg PO twice daily (last dose 5/2 @ 1610)  Assessment: 82 y.o. male with PMH including stroke, CAD, Afib on Eliquis, HTN, HLD, anemia who presents with slurred speech/CVA. Onset of symptoms on 5/2 @ 1230. Also initially had right arm weakness which has now resolved. Pt is scheduled for open left CEA on 5/7 and pharmacy has been consulted to manage peri-operative anticoagulation with heparin. Provider requested to omit bolus ISO recent stroke and communicated that plan is to stop the heparin 6 hrs before CEA planned 5/7.   Date Time aPTT/HL Rate/Comment 0503 1724 41/>1.10 Baseline 0504 0031 aPTT 136 Supratherapeutic 0504 1022 aPTT 136 Supratherapeutic 0504 1949 aPTT 65 Slightly below goal range (500un/hr > 550un/hr) 0505 0414 aPTT 69 Therapeutic x 1, HL not correlating 0505 1259 aPTT 79 Therapeutic x 2 0506 0535 aPTT  58 Subtherapeutic, HL not correlating 0506 1547 aPTT 36 Subtherapeutic 0507    0237   aPTT 146        elevated, suspect lab error 0507    0409   aPTT 149        elevated, appears to be valid 0507 1432 aPTT 76 Therapeutic x 1 0507    2216   aPTT 126        elevated  0509    0529   aPTT  51,HL 0.14  SUBtherapeutic  Goal of Therapy:  Heparin level 0.3-0.5 units/ml once heparin level and aPTT correlate.  aPTT 66-85 seconds Monitor platelets by anticoagulation protocol: Yes    Plan:  5/09 @ 0529:  aPTT = 51, HL = 0.14, both SUBtherapeutic - will increase heparin drip rate to 350 units/hr - will recheck aPTT and HL 8 hrs after rate change  -Continue to follow aPTT until correlation established with HL. Check HL tomorrow AM --Daily CBC per protocol while on IV heparin --Open left carotid endarterectomy 04/02/23 with vascular surgery; heparin to stop when patient is called for OR  Rhoderick Farrel D 04/03/2023 6:17 AM

## 2023-04-03 NOTE — Progress Notes (Signed)
OT Cancellation Note  Patient Details Name: UZAIR MANER MRN: [id number] DOB: 03-09-1941   Cancelled Treatment:    Reason Eval/Treat Not Completed: Medical issues which prohibited therapy. Per PT, RN stating to hold therapy today 2/2 pt not being medically stable to participate at this time.   Alvester Morin 04/03/2023, 1:17 PM

## 2023-04-03 NOTE — Progress Notes (Signed)
Progress Note    04/03/2023 10:01 AM 1 Day Post-Op  Subjective:   This is a 82 y.o. male  Richard Carter is a 82 y.o. 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 Patient was recently hospitalized from 3/24-3/29 due to stroke   Patient is now postop day 1 from a left carotid endarterectomy.  He is recovering as expected.  He continues to have intermittent periods of bradycardia in the high 30s that remains asymptomatic.  He continues on heparin infusion this morning which we will convert today to his home Eliquis dose.  Catheter was discontinued and he is urinating on his own.  He does endorse headache this morning.  Will treat with narcotics first and Decadron if reperfusion headache does not resolve.  Is all remained stable.   Vitals:   04/03/23 0915 04/03/23 0930  BP:    Pulse: (!) 58 (!) 45  Resp: 20 14  Temp:    SpO2: 98% 100%   Physical Exam: Cardiac:   Atrial Fibrillation, without  Murmurs, rubs or gallops; with carotid bruits  Lungs:   normal non-labored breathing, without Rales, rhonchi, wheezing  Incisions:  NONE Extremities:  without ischemic changes, without Gangrene , without cellulitis; without open wounds;  Abdomen:  Positive Bowel Sounds, soft, NT/ND, no masses  Neurologic:   A&O X 3;  No focal weakness or paresthesias are detected; speech is slightly slurred with a history of right sided facial droop.   CBC    Component Value Date/Time   WBC 13.3 (H) 04/03/2023 0359   RBC 2.83 (L) 04/03/2023 0359   HGB 7.2 (L) 04/03/2023 0359   HGB 12.9 (L) 10/25/2014 1048   HCT 23.1 (L) 04/03/2023 0359   HCT 38.6 (L) 10/25/2014 1048   PLT 170 04/03/2023 0359   PLT 93 (L) 10/25/2014 1048   MCV 81.6 04/03/2023 0359   MCV 108 (H) 10/25/2014 1048   MCH 25.4 (L)  04/03/2023 0359   MCHC 31.2 04/03/2023 0359   RDW 18.0 (H) 04/03/2023 0359   RDW 19.6 (H) 10/25/2014 1048   LYMPHSABS 1.4 03/27/2023 1409   LYMPHSABS 0.8 (L) 10/25/2014 1048   MONOABS 0.8 03/27/2023 1409   MONOABS 0.9 10/25/2014 1048   EOSABS 0.4 03/27/2023 1409   EOSABS 0.0 10/25/2014 1048   BASOSABS 0.1 03/27/2023 1409   BASOSABS 0.0 10/25/2014 1048    BMET    Component Value Date/Time   NA 139 04/03/2023 0359   NA 133 (L) 10/25/2014 1048   K 5.2 (H) 04/03/2023 0533   K 3.3 (L) 10/25/2014 1048   CL 115 (H) 04/03/2023 0359   CL 92 (L) 10/25/2014 1048   CO2 18 (L) 04/03/2023 0359   CO2 30 10/25/2014 1048   GLUCOSE 109 (H) 04/03/2023 0359   GLUCOSE 125 (H) 10/25/2014 1048   BUN 29 (H) 04/03/2023 0359   BUN 18 10/25/2014 1048   CREATININE 1.17 04/03/2023 0359   CREATININE 1.02 10/25/2014 1048   CALCIUM 8.9 04/03/2023 0359   CALCIUM 8.9 10/25/2014 1048   GFRNONAA >60 04/03/2023 0359   GFRNONAA >60 10/25/2014 1048   GFRNONAA >60 06/09/2014 0431   GFRAA >60 02/24/2020 0401   GFRAA >60 10/25/2014 1048   GFRAA >60 06/09/2014 0431    INR    Component Value Date/Time   INR 1.5 (H) 03/28/2023 1650  Intake/Output Summary (Last 24 hours) at 04/03/2023 1001 Last data filed at 04/03/2023 0859 Gross per 24 hour  Intake 2838.87 ml  Output 550 ml  Net 2288.87 ml     Assessment/Plan:  82 y.o. male is s/p Left Carotid Endarterectomy 1 Day Post-Op   PLAN: Stop Heparin Infusion and convert back to Home eliquis for AFIB Pain medication for headaches Continue dysphagia diet PT/OT Eval.   DVT prophylaxis:  Heparin Infusion   Marcie Bal [facility] 04/03/2023 10:01 AM

## 2023-04-03 NOTE — Progress Notes (Signed)
[facility] Progress Note  Patient: Richard Carter    WUJ:[id number]  DOA: 03/27/2023     Date of Service: the patient was seen and examined on 04/03/2023  Chief Complaint  Patient presents with   Aphasia   Brief hospital course: Richard Carter is a 82 y.o. 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  Found to have recurrent stroke.  Neurology and vascular surgery involved.  Plan for left CEA on 5/7.  Currently on IV heparin.     Assessment and Plan:  Stenosis of left carotid artery:  pt has high-grade left carotid arteries stenosis -consulted Dr. Wyn Quaker, vascular surgery, patient was already following as an outpatient.   S/p left carotid endarterectomy done on 04/02/2023. S/p Heparin IV infusion, d/c'd on 5/9 and resumed Eliquis 5 mg p.o. twice daily home dose Started aspirin 81 mg p.o. daily  Bradycardia, discussed with cardiology, stated that it is common after carotid endarterectomy, no need of pacemaker.  Continue to monitor.  Acute ischemic stroke MRI-brain showed acute infarct in the postcentral gyrus on the left. Neurology following. Carotid Dopplers unchanged from prior stenosis. S/p permissive hypertension. Patient was on Eliquis.  switched to heparin.  Cholesterol medications restarted as diet has been upgraded. PT OT were also consulted. Monitor on telemetry. Recently had an echocardiogram with preserved EF without any wall motion abnormality. Palliative care has been consulted.    Dysphagia Patient had multiple evaluation by SLP, recommended dysphagia 1 diet with nectar thick liquids.  Patient needs assistance with feeding, instructions were given to RN by speech therapist.     paroxysmal atrial fibrillation Heart rate well-controlled S/p  heparin IV infusion, resumed Eliquis 5 mg p.o. twice daily on 5/9. Discontinued beta-blocker due to bradycardia  Started amlodipine 5 mg p.o. daily   seizures (HCC) -Seizure precaution -As needed Ativan for seizure -Continue home Keppra, now IV.   HTN (hypertension): Allowed permissive hypertension for 48 hrs -Resumed amlodipine Continue to hold Cozaar Discontinued metoprolol due to bradycardia   HLD: - Continue his home atorvastatin LDL <70   Dementia (HCC): No behavior disturbance -Fall precaution  Hyperkalemia, potassium 5.2 slightly elevated Started Lokelma 10 g p.o. 3 times daily x 2 doses Continue low potassium diet, monitor potassium level daily  Metabolic acidosis, CO2 18, started oral bicarbonate supplement.   Anemia due to iron deficiency and B12 deficiency Hb 7.0-7.2, remains low, 1 unit PRBC transfused on 5/9 Monitor H&H and transfuse if hemoglobin less than 7   Iron deficiency anemia, transferrin saturation 6% Started ferric gluconate 250 mg IV daily for 4 doses, followed by oral supplement with vitamin C. Folic acid level 8.7, within normal range Monitor H&H and transfuse if hemoglobin less than 7  Vitamin B12 deficiency, B12 level 165, goal >400, started vitamin B12 1000 mcg IM injection daily during hospital stay, followed by oral supplement.  Follow-up PCP to repeat vitamin B12 level after 3 to 6 months.   Body mass index is 21.44 kg/m.  Interventions:      Diet: Dysphagia 1 diet with nectar thick liquids DVT Prophylaxis: Therapeutic Anticoagulation with Eliquis    Advance goals of care discussion: DNR  Family Communication: family was not present at bedside, at the time of interview.  The pt provided permission to discuss medical plan with the family. Opportunity was given to  ask question and all questions were answered satisfactorily.   Disposition:  Pt is from Home, admitted with stroke and found to have left carotid stenosis, s/p CEA done  on 5/8 by vascular surgery. S/p Hep gtt. gradually improving, most likely pt will be stable to discharge in 1 to 2 days Discharge to SNF, TOC following for placement  Subjective: No significant events overnight, patient was sleepy, woke up by calling his name.  AAO x 2.  Sleeping well, denied any pain, no chest pain or palpitation, no shortness of breath, no any other active issues.   Physical Exam: General: NAD, lying comfortably Appear in no distress, affect appropriate Eyes: PERRLA ENT: Oral Mucosa Clear, moist  Neck: no JVD,  Cardiovascular: S1 and S2 Present, no Murmur,  Respiratory: good respiratory effort, Bilateral Air entry equal and Decreased, no Crackles, no wheezes Abdomen: Bowel Sound present, Soft and no tenderness,  Skin: no rashes Extremities: no Pedal edema, no calf tenderness Neurologic: Residual weakness on the right side due to prior stroke. Gait not checked due to patient safety concerns  Vitals:   04/03/23 1230 04/03/23 1245 04/03/23 1400 04/03/23 1439  BP:   (!) 107/55 (!) 109/46  Pulse: (!) 55 (!) 51 63 (!) 54  Resp: 17 19 20 18   Temp:   97.8 F (36.6 C) 97.6 F (36.4 C)  TempSrc:   Oral Oral  SpO2: 97% 92% 95% 99%  Weight:      Height:        Intake/Output Summary (Last 24 hours) at 04/03/2023 1529 Last data filed at 04/03/2023 1433 Gross per 24 hour  Intake 3502.91 ml  Output 550 ml  Net 2952.91 ml   Filed Weights   03/28/23 1600 04/02/23 0700 04/02/23 1058  Weight: 62.1 kg 62.1 kg 66.3 kg    Data Reviewed: I have personally reviewed and interpreted daily labs, tele strips, imagings as discussed above. I reviewed all nursing notes, pharmacy notes, vitals, pertinent old records I have discussed plan of care as described above with RN and patient/family.  CBC: Recent Labs  Lab 03/30/23 0414 03/31/23 0535 04/01/23 0237 04/02/23 0605 04/02/23 1151 04/02/23 2044 04/03/23 0359  WBC 6.6 7.6 10.1 8.5  --   --  13.3*  HGB 7.8* 7.8* 7.2* 7.0*  6.8* 7.7* 7.2*  HCT 25.0* 25.3* 22.9* 22.4* 22.5* 25.3* 23.1*  MCV 82.0 82.1 81.2 82.7  --   --  81.6  PLT 305 260 231 199  --   --  170   Basic Metabolic Panel: Recent Labs  Lab 03/29/23 0428 03/30/23 0414 04/01/23 0237 04/02/23 0605 04/03/23 0359 04/03/23 0533  NA 139 139 139 141 139  --   K 3.9 3.7 4.2 3.9 5.4* 5.2*  CL 114* 113* 110 115* 115*  --   CO2 19* 20* 21* 22 18*  --   GLUCOSE 82 74 120* 93 109*  --   BUN 20 19 34* 29* 29*  --   CREATININE 1.05 1.05 1.38* 1.07 1.17  --   CALCIUM 9.8 9.5 9.4 9.3 8.9  --   MG 1.7  --  1.6* 2.1 1.7  --   PHOS  --   --  3.6 2.9 3.2  --     Studies: No results found.  Scheduled Meds:  sodium chloride   Intravenous Once   sodium chloride   Intravenous Once   amLODipine  5 mg Oral Daily   apixaban  5 mg Oral BID   [START ON  04/04/2023] ascorbic acid  500 mg Oral Daily   aspirin EC  81 mg Oral Q0600   atorvastatin  20 mg Oral QPM   Chlorhexidine Gluconate Cloth  6 each Topical Daily   cyanocobalamin  1,000 mcg Intramuscular Daily   Followed by   Melene Muller ON 04/08/2023] vitamin B-12  1,000 mcg Oral Daily   docusate sodium  100 mg Oral Daily   feeding supplement (NEPRO CARB STEADY)  237 mL Oral TID BM   [START ON 04/04/2023] iron polysaccharides  150 mg Oral Daily   levETIRAcetam  500 mg Oral Q12H   [START ON 04/04/2023] multivitamin with minerals  1 tablet Oral Daily   pantoprazole (PROTONIX) IV  40 mg Intravenous Q24H   pantoprazole (PROTONIX) IV  40 mg Intravenous QHS   sodium bicarbonate  650 mg Oral TID   sodium chloride flush  10-40 mL Intracatheter Q12H   sodium zirconium cyclosilicate  10 g Oral TID   Continuous Infusions:  sodium chloride     sodium chloride 50 mL/hr at 04/03/23 1433   ferric gluconate (FERRLECIT) IVPB Stopped (04/03/23 1057)   levETIRAcetam Stopped (04/03/23 0848)   PRN Meds: sodium chloride, acetaminophen **OR** acetaminophen, alum & mag hydroxide-simeth, fentaNYL (SUBLIMAZE) injection, food thickener,  guaiFENesin-dextromethorphan, hydrALAZINE, hydrALAZINE, HYDROmorphone (DILAUDID) injection, labetalol, LORazepam, magnesium hydroxide, metoprolol tartrate, morphine injection, ondansetron (ZOFRAN) IV, oxyCODONE, phenol, sodium chloride flush, sorbitol  Time spent: 35 minutes  Author: Gillis Santa. MD [facility] 04/03/2023 3:29 PM  To reach On-call, see care teams to locate the attending and reach out to them via [email or web address]. If 7PM-7AM, please contact night-coverage If you still have difficulty reaching the attending provider, please page the [facility] (Director on Call) for [facility] on amion for assistance.

## 2023-04-03 NOTE — Progress Notes (Signed)
PT Cancellation Note  Patient Details Name: Richard Carter MRN: [id number] DOB: 02/02/1941   Cancelled Treatment:    Reason Eval/Treat Not Completed: Other (comment) Per conversation with RN pt has been very sleepy/lethargic today and has declined encouragement to get up to recliner, do any real activity.  Hgb 7.2.  RN reports he is probably not up for/appropriate to work with PT this date.  Will maintain on caseload, hoping to have increased energy/motivation after transfusion.    Malachi Pro, DPT 04/03/2023, 3:00 PM

## 2023-04-04 DIAGNOSIS — I6359 Cerebral infarction due to unspecified occlusion or stenosis of other cerebral artery: Secondary | ICD-10-CM | POA: Diagnosis not present

## 2023-04-04 LAB — CBC
HCT: 25.6 % — ABNORMAL LOW (ref 39.0–52.0)
Hemoglobin: 8.1 g/dL — ABNORMAL LOW (ref 13.0–17.0)
MCH: 26.6 pg (ref 26.0–34.0)
MCHC: 31.6 g/dL (ref 30.0–36.0)
MCV: 83.9 fL (ref 80.0–100.0)
Platelets: 180 10*3/uL (ref 150–400)
RBC: 3.05 MIL/uL — ABNORMAL LOW (ref 4.22–5.81)
RDW: 17.7 % — ABNORMAL HIGH (ref 11.5–15.5)
WBC: 12.2 10*3/uL — ABNORMAL HIGH (ref 4.0–10.5)
nRBC: 0.3 % — ABNORMAL HIGH (ref 0.0–0.2)

## 2023-04-04 LAB — BASIC METABOLIC PANEL
Anion gap: 6 (ref 5–15)
BUN: 37 mg/dL — ABNORMAL HIGH (ref 8–23)
CO2: 21 mmol/L — ABNORMAL LOW (ref 22–32)
Calcium: 8.9 mg/dL (ref 8.9–10.3)
Chloride: 115 mmol/L — ABNORMAL HIGH (ref 98–111)
Creatinine, Ser: 1.25 mg/dL — ABNORMAL HIGH (ref 0.61–1.24)
GFR, Estimated: 57 mL/min — ABNORMAL LOW (ref >60)
Glucose, Bld: 112 mg/dL — ABNORMAL HIGH (ref 70–99)
Potassium: 4.5 mmol/L (ref 3.5–5.1)
Sodium: 142 mmol/L (ref 135–145)

## 2023-04-04 LAB — TYPE AND SCREEN
Antibody Screen: NEGATIVE
Unit division: 0

## 2023-04-04 LAB — BPAM RBC
ISSUE DATE / TIME: 202405081418
Unit Type and Rh: 9500

## 2023-04-04 LAB — PHOSPHORUS: Phosphorus: 2.6 mg/dL (ref 2.5–4.6)

## 2023-04-04 LAB — MAGNESIUM: Magnesium: 1.8 mg/dL (ref 1.7–2.4)

## 2023-04-04 MED ORDER — AMLODIPINE BESYLATE 5 MG PO TABS
5.0000 mg | ORAL_TABLET | Freq: Every evening | ORAL | Status: DC
  Administered 2023-04-04 – 2023-04-06 (×2): 5 mg via ORAL
  Filled 2023-04-04 (×3): qty 1

## 2023-04-04 MED ORDER — HYDRALAZINE HCL 50 MG PO TABS: 50.0000 mg | ORAL_TABLET | Freq: Four times a day (QID) | ORAL | Status: DC | PRN

## 2023-04-04 MED ORDER — HYDRALAZINE HCL 20 MG/ML IJ SOLN: 10.0000 mg | Freq: Four times a day (QID) | INTRAMUSCULAR | Status: DC | PRN

## 2023-04-04 MED ORDER — SODIUM CHLORIDE 0.9 % IV SOLN: INTRAVENOUS | Status: DC

## 2023-04-04 MED ORDER — PANTOPRAZOLE SODIUM 40 MG PO TBEC
40.0000 mg | DELAYED_RELEASE_TABLET | Freq: Every day | ORAL | Status: DC
  Administered 2023-04-05 – 2023-04-07 (×3): 40 mg via ORAL
  Filled 2023-04-04 (×3): qty 1

## 2023-04-04 NOTE — Evaluation (Addendum)
Physical Therapy Re-Evaluation Patient Details Name: Richard Carter MRN: [id number] DOB: 02/18/1941 Today's Date: 04/04/2023  History of Present Illness  Pt is an 82 y.o. male presenting to hospital 03/27/23 with sudden onset of difficulties with speech; R sided weakness also noted but resolved; R sided sensation changes noted as well as facial droop.  Imaging showing acute infarct in the postcentral gyrus on L.  Pt admitted with stroke, stenosis of L carotid artery, a-fib, and htn.  Recent hospitalization 3/24-3/29 for stroke.  PMH includes alcohol abuse, dementia, htn, seizures, stroke (with L sided hemiparesis), a-fib, AKI, pubic ramus and acetabular fx 2020, UTI, chronic L middle finger contracture.  Per notes pt also with h/o R UE weakness. Is now s/p carotid endarterectomy 04/02/2023.  Clinical Impression  PT re-evaluation completed following endarterectomy procedure. The patient was seen with OT this date for complexity, for safety, and to address mobility needs. The patient is oriented to self and place. He is able to follow single step commands most of the time with increased time. Expressive language deficits noted with difficulty understanding patient's speech at times.  The patient was able to stand x 2 bouts with +2 person assistance using rolling walker. He has a right side lean with standing and external support required to maintain standing balance. He required +2 person assistance to take a few side steps along edge of bed with activity tolerance limited by fatigue. Recommend to continue PT to maximize independence and decrease caregiver burden. Anticipate the need for frequent supervision/assistance at discharge.      Recommendations for follow up therapy are one component of a multi-disciplinary discharge planning process, led by the attending physician.  Recommendations may be updated based on patient status, additional functional criteria and insurance authorization.  Follow Up  Recommendations Can patient physically be transported by private vehicle: No     Assistance Recommended at Discharge Frequent or constant Supervision/Assistance  Patient can return home with the following  Assistance with cooking/housework;Direct supervision/assist for medications management;Assist for transportation;Help with stairs or ramp for entrance;Two people to help with walking and/or transfers;A lot of help with bathing/dressing/bathroom;Assistance with feeding;Direct supervision/assist for financial management    Equipment Recommendations  (to be determined at next venue of care)  Recommendations for Other Services       Functional Status Assessment Patient has had a recent decline in their functional status and demonstrates the ability to make significant improvements in function in a reasonable and predictable amount of time.     Precautions / Restrictions Precautions Precautions: Fall Precaution Comments: Seizure precautions Restrictions Weight Bearing Restrictions: No      Mobility  Bed Mobility Overal bed mobility: Needs Assistance Bed Mobility: Supine to Sit, Sit to Supine     Supine to sit: Mod assist, HOB elevated Sit to supine: Mod assist, +2 for physical assistance   General bed mobility comments: increased assistance required for returning to bed. verbal cues for sequencing and technique with increased time and effort required    Transfers Overall transfer level: Needs assistance Equipment used: Rolling walker (2 wheels) Transfers: Sit to/from Stand Sit to Stand: Mod assist, +2 physical assistance           General transfer comment: lifting assistance required for standing x 2 bouts.    Ambulation/Gait             Pre-gait activities: patient required Mod A +2 person for 2 bouts of taking side steps to the left (1 step and then 5 steps)  with rolling walker for support. mild right side lean with general LE weakness present with standing  activity. unable to progress gait further due to fatigue with activity    Stairs            Wheelchair Mobility    Modified Rankin (Stroke Patients Only)       Balance Overall balance assessment: Needs assistance Sitting-balance support: Feet supported Sitting balance-Leahy Scale: Fair   Postural control: Right lateral lean Standing balance support: Bilateral upper extremity supported, During functional activity, Reliant on assistive device for balance Standing balance-Leahy Scale: Poor Standing balance comment: external support required from therapist. Min A- Mod A needed to maintain midline with right side lean                             Pertinent Vitals/Pain Pain Assessment Pain Assessment: No/denies pain    Home Living Family/patient expects to be discharged to:: Private residence Living Arrangements: Non-relatives/Friends Available Help at Discharge: Available 24 hours/day;Neighbor;Home health Type of Home: Mobile home Home Access: Ramped entrance       Home Layout: One level Home Equipment: Grab bars - tub/shower;Shower seat;Grab bars - toilet;Rolling Walker (2 wheels);BSC/3in1;Wheelchair - manual Additional Comments: Pt typically sleeps on sofa, per caregiver report    Prior Function Prior Level of Function : Needs assist             Mobility Comments: Modified independent ambulating with RW.  No falls in last 6 months. ADLs Comments: Network engineer (caregiver) reports he shares POA with pt's son and assists pt with ADLs/IADLs (dressing, bathing, medications, meals, cleaning, etc)--assists pt 7 days a week and assists 12 plus hours a day. States that pt typically does toileting, grooming, and eating independently. information is per prior notes     Hand Dominance   Dominant Hand: Right    Extremity/Trunk Assessment   Upper Extremity Assessment Upper Extremity Assessment: Defer to OT evaluation RUE Deficits / Details: Pt with difficulty  getting more than a few degrees of shoulder flexion RUE (dominant side); significantly decreased hand grip. Unable to use RUE for self-feeding at bed level today LUE Deficits / Details: shoulder flexion to approx 70 degrees; poor FMC, but able to have gross grasp on silverware for self-feeding.    Lower Extremity Assessment Lower Extremity Assessment: LLE deficits/detail;RLE deficits/detail RLE Deficits / Details: knee extension 4/5, hip add/abd 4+/5, dorsiflexion 4/5, plantarflexion 5/5 RLE Sensation:  (patient reports no numbness, difficult to formally assess due to expressive communication deficits) LLE Deficits / Details: knee extension 4+/5, hip add/abd 4+/5, dorsiflexion 4+/5, plantarflexion 4+/5 LLE Sensation:  (patient reports no numbness, difficult to formally assess due to expressive communication deficits)       Communication   Communication: Expressive difficulties  Cognition Arousal/Alertness: Awake/alert Behavior During Therapy: Flat affect Overall Cognitive Status: History of cognitive impairments - at baseline                                 General Comments: patient is able to follow single step commands most of the time with increased time. oriented to place and self. disoriented to time, situation        General Comments General comments (skin integrity, edema, etc.): PT goals updated accordingly    Exercises     Assessment/Plan    PT Assessment Patient needs continued PT services  PT Problem List Decreased strength;Decreased  activity tolerance;Decreased balance;Decreased mobility;Decreased knowledge of use of DME;Decreased safety awareness;Decreased knowledge of precautions       PT Treatment Interventions DME instruction;Gait training;Functional mobility training;Therapeutic activities;Therapeutic exercise;Balance training;Patient/family education;Wheelchair mobility training    PT Goals (Current goals can be found in the Care Plan section)   Acute Rehab PT Goals Patient Stated Goal: improved mobility PT Goal Formulation: With patient Time For Goal Achievement: 04/18/23 Potential to Achieve Goals: Fair    Frequency Min 4X/week     Co-evaluation PT/OT/SLP Co-Evaluation/Treatment: Yes Reason for Co-Treatment: Complexity of the patient's impairments (multi-system involvement);To address functional/ADL transfers;For patient/therapist safety PT goals addressed during session: Mobility/safety with mobility OT goals addressed during session: ADL's and self-care;Strengthening/ROM       AM-PAC PT "6 Clicks" Mobility  Outcome Measure Help needed turning from your back to your side while in a flat bed without using bedrails?: A Lot Help needed moving from lying on your back to sitting on the side of a flat bed without using bedrails?: A Lot Help needed moving to and from a bed to a chair (including a wheelchair)?: Total Help needed standing up from a chair using your arms (e.g., wheelchair or bedside chair)?: Total Help needed to walk in hospital room?: Total Help needed climbing 3-5 steps with a railing? : Total 6 Click Score: 8    End of Session   Activity Tolerance: Patient limited by fatigue Patient left: in bed;with call bell/phone within reach;with bed alarm set (with OT at the bedside) Nurse Communication: Mobility status PT Visit Diagnosis: Unsteadiness on feet (R26.81);Other abnormalities of gait and mobility (R26.89);Muscle weakness (generalized) (M62.81)    Time: 1610-9604 PT Time Calculation (min) (ACUTE ONLY): 16 min   Charges:   PT Evaluation $PT Re-evaluation: 1 Re-eval          Donna Bernard, PT, MPT   Ina Homes 04/04/2023, 11:11 AM

## 2023-04-04 NOTE — Progress Notes (Signed)
I called and spoke to [facility] to inform him patient is moving to room 255. [name] appreciative of update. All patient belongings including dentures packed and sent with patient. No remaining patient belongings in ICU room 7.

## 2023-04-04 NOTE — Progress Notes (Addendum)
[facility] Progress Note  Patient: Richard Carter    ZOX:[id number]  DOA: 03/27/2023     Date of Service: the patient was seen and examined on 04/04/2023  Chief Complaint  Patient presents with   Aphasia   Brief hospital course: Richard Carter is a 82 y.o. 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  Found to have recurrent stroke.  Neurology and vascular surgery involved.  Plan for left CEA on 5/7.  Currently on IV heparin.     Assessment and Plan:  Stenosis of left carotid artery:  pt has high-grade left carotid arteries stenosis -consulted Dr. Wyn Quaker, vascular surgery, patient was already following as an outpatient.   S/p left carotid endarterectomy done on 04/02/2023. S/p Heparin IV infusion, d/c'd on 5/9 and resumed Eliquis 5 mg p.o. twice daily home dose Started aspirin 81 mg p.o. daily  Bradycardia, discussed with cardiology, stated that it is common after carotid endarterectomy, no need of pacemaker.  Continue to monitor.  Acute ischemic stroke MRI-brain showed acute infarct in the postcentral gyrus on the left. Neurology following. Carotid Dopplers unchanged from prior stenosis. S/p permissive hypertension. S/p heparin IV infusion, resumed Eliquis.    Cholesterol medications restarted as diet has been upgraded. PT OT were also consulted. Monitor on telemetry. Recently had an echocardiogram with preserved EF without any wall motion abnormality. Palliative care has been consulted.     AKI most likely secondary to dehydration Patient is on dysphagia 1 diet with nectar thick liquids Started IV fluid for gentle hydration Monitor renal functions daily   Dysphagia Patient had multiple evaluation by SLP, recommended dysphagia 1 diet with nectar thick  liquids.  Patient needs assistance with feeding, instructions were given to RN by speech therapist.     paroxysmal atrial fibrillation Heart rate well-controlled S/p heparin IV infusion, resumed Eliquis 5 mg p.o. twice daily on 5/9. Discontinued beta-blocker due to bradycardia  Started amlodipine 5 mg p.o. daily   seizures (HCC) -Seizure precaution -As needed Ativan for seizure -Continue home Keppra, now IV.   HTN (hypertension): Allowed permissive hypertension for 48 hrs -Resumed amlodipine Continue to hold Cozaar Discontinued metoprolol due to bradycardia 5/10 patient is becoming hypertensive, added amlodipine 5 mg every evening with holding parameters to skip the dose if SBP less than 140 mmHg  HLD: - Continue his home atorvastatin LDL <70   Dementia (HCC): No behavior disturbance -Fall precaution  Hyperkalemia, potassium 5.2 slightly elevated Started Lokelma 10 g p.o. 3 times daily x 2 doses Continue low potassium diet, monitor potassium level daily 5/10 hyperkalemia resolved  Metabolic acidosis, CO2 18,  CO2 21, improved after oral bicarbonate.  Anemia due to iron deficiency and B12 deficiency Hb 7.0-7.2, remains low, 1 unit PRBC transfused on 5/9 5/10 Hb 8.1, posttransfusion. Monitor H&H and transfuse if hemoglobin less than 7   Iron deficiency anemia, transferrin saturation 6% S/p ferric gluconate 250 mg IV daily for 4 doses, followed by oral supplement with vitamin C. Folic acid level 8.7, within normal range Monitor H&H and transfuse if hemoglobin less than 7  Vitamin B12 deficiency, B12 level 165, goal >400, started vitamin B12 1000 mcg IM injection daily during hospital stay, followed by oral supplement.  Follow-up PCP to repeat vitamin B12 level after 3 to 6 months.   Body mass index is 21.44  kg/m.  Interventions:      Diet: Dysphagia 1 diet with nectar thick liquids DVT Prophylaxis: Therapeutic Anticoagulation with Eliquis    Advance goals of  care discussion: DNR  Family Communication: family was not present at bedside, at the time of interview.  The pt provided permission to discuss medical plan with the family. Opportunity was given to ask question and all questions were answered satisfactorily.   Disposition:  Pt is from Home, admitted with stroke and found to have left carotid stenosis, s/p CEA done on 5/8 by vascular surgery. S/p Hep gtt. gradually improving, most likely pt will be stable to discharge in 1 to 2 days Discharge to SNF, TOC following for placement 5/10, low-grade fever Tmax 100.4, WBC 12.2, creatinine slightly elevated, started IV fluid for hydration.  Clinically stable, will repeat labs tomorrow a.m.   Subjective: No significant events overnight, patient was resting comfortably, denied any complaints.  Physical Exam: General: NAD, lying comfortably Appear in no distress, affect appropriate Eyes: PERRLA ENT: Oral Mucosa Clear, moist  Neck: no JVD,  Cardiovascular: S1 and S2 Present, no Murmur,  Respiratory: good respiratory effort, Bilateral Air entry equal and Decreased, no Crackles, no wheezes Abdomen: Bowel Sound present, Soft and no tenderness,  Skin: no rashes Extremities: no Pedal edema, no calf tenderness Neurologic: Residual weakness on the right side due to prior stroke. Gait not checked due to patient safety concerns  Vitals:   04/04/23 1100 04/04/23 1200 04/04/23 1300 04/04/23 1400  BP: (!) 145/70 (!) 150/62 (!) 180/118 (!) 144/78  Pulse: 73 70 92 62  Resp: 17 16 (!) 24 (!) 23  Temp:  100 F (37.8 C)    TempSrc:  Axillary    SpO2: 98% 99% 98% 97%  Weight:      Height:        Intake/Output Summary (Last 24 hours) at 04/04/2023 1458 Last data filed at 04/04/2023 0916 Gross per 24 hour  Intake 1636.72 ml  Output 1000 ml  Net 636.72 ml   Filed Weights   04/02/23 0700 04/02/23 1058 04/04/23 0300  Weight: 62.1 kg 66.3 kg 69 kg    Data Reviewed: I have personally reviewed and  interpreted daily labs, tele strips, imagings as discussed above. I reviewed all nursing notes, pharmacy notes, vitals, pertinent old records I have discussed plan of care as described above with RN and patient/family.  CBC: Recent Labs  Lab 04/01/23 0237 04/02/23 0605 04/02/23 1151 04/02/23 2044 04/03/23 0359 04/03/23 1813 04/04/23 0344  WBC 10.1 8.5  --   --  13.3* 11.3* 12.2*  HGB 7.2* 7.0* 6.8* 7.7* 7.2* 8.0* 8.1*  HCT 22.9* 22.4* 22.5* 25.3* 23.1* 26.3* 25.6*  MCV 81.2 82.7  --   --  81.6 85.1 83.9  PLT 231 199  --   --  170 165 180   Basic Metabolic Panel: Recent Labs  Lab 03/29/23 0428 03/30/23 0414 04/01/23 0237 04/02/23 0605 04/03/23 0359 04/03/23 0533 04/04/23 0344  NA 139 139 139 141 139  --  142  K 3.9 3.7 4.2 3.9 5.4* 5.2* 4.5  CL 114* 113* 110 115* 115*  --  115*  CO2 19* 20* 21* 22 18*  --  21*  GLUCOSE 82 74 120* 93 109*  --  112*  BUN 20 19 34* 29* 29*  --  37*  CREATININE 1.05 1.05 1.38* 1.07 1.17  --  1.25*  CALCIUM 9.8 9.5 9.4 9.3 8.9  --  8.9  MG 1.7  --  1.6* 2.1 1.7  --  1.8  PHOS  --   --  3.6 2.9 3.2  --  2.6    Studies: No results found.  Scheduled Meds:  sodium chloride   Intravenous Once   sodium chloride   Intravenous Once   amLODipine  5 mg Oral Daily   apixaban  5 mg Oral BID   ascorbic acid  500 mg Oral Daily   aspirin EC  81 mg Oral Q0600   atorvastatin  20 mg Oral QPM   Chlorhexidine Gluconate Cloth  6 each Topical Daily   cyanocobalamin  1,000 mcg Intramuscular Daily   Followed by   Melene Muller ON 04/08/2023] vitamin B-12  1,000 mcg Oral Daily   docusate sodium  100 mg Oral Daily   feeding supplement (NEPRO CARB STEADY)  237 mL Oral TID BM   iron polysaccharides  150 mg Oral Daily   levETIRAcetam  500 mg Oral Q12H   multivitamin with minerals  1 tablet Oral Daily   pantoprazole (PROTONIX) IV  40 mg Intravenous Q24H   pantoprazole (PROTONIX) IV  40 mg Intravenous QHS   sodium chloride flush  10-40 mL Intracatheter Q12H    Continuous Infusions:  sodium chloride     sodium chloride 75 mL/hr at 04/04/23 1037   levETIRAcetam 500 mg (04/04/23 0958)   PRN Meds: sodium chloride, acetaminophen **OR** acetaminophen, alum & mag hydroxide-simeth, fentaNYL (SUBLIMAZE) injection, food thickener, guaiFENesin-dextromethorphan, hydrALAZINE, hydrALAZINE, HYDROmorphone (DILAUDID) injection, labetalol, LORazepam, magnesium hydroxide, metoprolol tartrate, morphine injection, ondansetron (ZOFRAN) IV, oxyCODONE, phenol, sodium chloride flush, sorbitol  Time spent: 35 minutes  Author: Gillis Santa. MD [facility] 04/04/2023 2:58 PM  To reach On-call, see care teams to locate the attending and reach out to them via [email or web address]. If 7PM-7AM, please contact night-coverage If you still have difficulty reaching the attending provider, please page the [facility] (Director on Call) for [facility] on amion for assistance.

## 2023-04-04 NOTE — TOC Progression Note (Signed)
Transition of Care [facility]) - Progression Note    Patient Details  Name: Richard Carter MRN: [id number] Date of Birth: 06/10/1941  [facility] CM/SW Contact  Darolyn Rua, [place] Phone Number: 04/04/2023, 9:46 AM  Clinical Narrative:     Bed offers provided to [facility], he reports he chooses [facility]. CSW explained next steps of insurance process once medically appropriate and encouraged patient participation in PT. [name] reports no questions or concerns at this time.   CSW has reached out to MD to identify potential medical readiness timeframe in order to assess insurance authorization start time frame. Pending response at this time.   Expected Discharge Plan: Home w Home Health Services Barriers to Discharge: Continued Medical Work up  Expected Discharge Plan and Services       Living arrangements for the past 2 months: Single Family Home                             HH Agency: [facility] Date [facility] Agency Contacted: 03/29/23   Representative spoke with at [facility] Agency: [name]   Social Determinants of Health (SDOH) Interventions SDOH Screenings   Food Insecurity: No Food Insecurity (02/16/2023)  Housing: Low Risk  (02/16/2023)  Transportation Needs: No Transportation Needs (02/16/2023)  Utilities: Not At Risk (02/16/2023)  Tobacco Use: Medium Risk (04/03/2023)    Readmission Risk Interventions    04/21/2022    1:55 PM  Readmission Risk Prevention Plan  Post Dischage Appt Complete  Medication Screening Complete  Transportation Screening Complete

## 2023-04-04 NOTE — Evaluation (Signed)
Occupational Therapy Re-Evaluation Patient Details Name: Richard Carter MRN: [id number] DOB: [birth year] Today's Date: 04/04/2023   History of Present Illness Pt is an 82 y.o. male presenting to hospital 03/27/23 with sudden onset of difficulties with speech; R sided weakness also noted but resolved; R sided sensation changes noted as well as facial droop.  Imaging showing acute infarct in the postcentral gyrus on L.  Pt admitted with stroke, stenosis of L carotid artery, a-fib, and htn.  Recent hospitalization 3/24-3/29 for stroke.  PMH includes alcohol abuse, dementia, htn, seizures, stroke (with L sided hemiparesis), a-fib, AKI, pubic ramus and acetabular fx 2020, UTI, chronic L middle finger contracture.  Per notes pt also with h/o R UE weakness. Is now s/p carotid endarterectomy 04/02/2023.   Clinical Impression   Patient received for OT re-evaluation following carotid endarterectomy 5/8. See flowsheet below for details of function. Generally, patient requiring MOD A for bed mobility, MOD A x2 for functional sidesteps with RW, and MIN A-MAX A for ADLs.       Recommendations for follow up therapy are one component of a multi-disciplinary discharge planning process, led by the attending physician.  Recommendations may be updated based on patient status, additional functional criteria and insurance authorization.   Assistance Recommended at Discharge Frequent or constant Supervision/Assistance  Patient can return home with the following A little help with walking and/or transfers;A lot of help with bathing/dressing/bathroom;Assistance with cooking/housework;Direct supervision/assist for medications management;Direct supervision/assist for financial management;Assist for transportation;Help with stairs or ramp for entrance    Functional Status Assessment  Patient has had a recent decline in their functional status and demonstrates the ability to make significant improvements in function in a  reasonable and predictable amount of time.  Equipment Recommendations  None recommended by OT    Recommendations for Other Services       Precautions / Restrictions Precautions Precautions: Fall Precaution Comments: Seizure precautions Restrictions Weight Bearing Restrictions: No      Mobility Bed Mobility Overal bed mobility: Needs Assistance Bed Mobility: Sit to Supine     Supine to sit: Mod assist, HOB elevated Sit to supine: Mod assist, +2 for physical assistance        Transfers Overall transfer level: Needs assistance Equipment used: Rolling walker (2 wheels) Transfers: Sit to/from Stand Sit to Stand: Mod assist, +2 physical assistance           General transfer comment: Pt sidestepped with MOD A x2 at EOB using RW; stepped once, then needed to sit. On second attempt able to sidestep about 5x towards bed rail, then sat on EOB.      Balance Overall balance assessment: Needs assistance Sitting-balance support: Feet supported Sitting balance-Leahy Scale: Fair     Standing balance support: Bilateral upper extremity supported, During functional activity, Reliant on assistive device for balance Standing balance-Leahy Scale: Poor                             ADL either performed or assessed with clinical judgement   ADL Overall ADL's : Needs assistance/impaired Eating/Feeding: Minimal assistance;Cueing for safety;Cueing for compensatory techinques;Bed level Eating/Feeding Details (indicate cue type and reason): HOB highly elevated; pt needing encouragement to use LUE for self-feeding, and occasional assist from OT to place food on spoon or fork prior to pt taking it to eat; needing OT assist to hold magic cup in position so pt could scoop it out for self-feeding. Pt coughing heavily; SLP  notified.                                   General ADL Comments: Pt dependent for donning socks today. Anticipate MAX A for nearly all ADLs at this  time given decreased activity tolerance, decreased sitting tolerance, and decreased UE and LE function.     Vision Baseline Vision/History: 1 Wears glasses       Perception     Praxis      Pertinent Vitals/Pain Pain Assessment Pain Assessment: No/denies pain     Hand Dominance Right   Extremity/Trunk Assessment Upper Extremity Assessment Upper Extremity Assessment: Generalized weakness;RUE deficits/detail;LUE deficits/detail RUE Deficits / Details: Pt with difficulty getting more than a few degrees of shoulder flexion RUE (dominant side); significantly decreased hand grip. Unable to use RUE for self-feeding at bed level today LUE Deficits / Details: shoulder flexion to approx 70 degrees; poor FMC, but able to have gross grasp on silverware for self-feeding.   Lower Extremity Assessment Lower Extremity Assessment: Defer to PT evaluation       Communication Communication Communication: Expressive difficulties   Cognition Arousal/Alertness: Awake/alert Behavior During Therapy: Flat affect Overall Cognitive Status: History of cognitive impairments - at baseline                                 General Comments: Pt is oriented to self, "hospital", and can name his birthday. Follows verbal cues with extra time to the best of his physical ability.     General Comments  Pt difficult to understand 2/2 dysarthria.    Exercises     Shoulder Instructions      Home Living Family/patient expects to be discharged to:: Private residence Living Arrangements: Non-relatives/Friends (POA [name] is there nearly 24/7) Available Help at Discharge: Available 24 hours/day;Neighbor;Home health Type of Home: Mobile home Home Access: Ramped entrance     Home Layout: One level     Bathroom Shower/Tub: Producer, television/film/video: Handicapped height Bathroom Accessibility: Yes   Home Equipment: Grab bars - tub/shower;Shower seat;Grab bars - toilet;Rolling Walker (2  wheels);BSC/3in1;Wheelchair - manual   Additional Comments: Pt typically sleeps on sofa, per caregiver report      Prior Functioning/Environment Prior Level of Function : Needs assist             Mobility Comments: Modified independent ambulating with RW.  No falls in last 6 months. ADLs Comments: Network engineer (caregiver) reports he shares POA with pt's son and assists pt with ADLs/IADLs (dressing, bathing, medications, meals, cleaning, etc)--assists pt 7 days a week and assists 12 plus hours a day. States that pt typically does toileting, grooming, and eating independently.        OT Problem List: Decreased strength;Decreased activity tolerance;Impaired balance (sitting and/or standing);Decreased cognition;Decreased safety awareness      OT Treatment/Interventions: Self-care/ADL training;Therapeutic exercise;Therapeutic activities;Cognitive remediation/compensation    OT Goals(Current goals can be found in the care plan section) Acute Rehab OT Goals Patient Stated Goal: Get better OT Goal Formulation: Patient unable to participate in goal setting Time For Goal Achievement: 04/18/23 Potential to Achieve Goals: Fair  OT Frequency: Min 2X/week    Co-evaluation PT/OT/SLP Co-Evaluation/Treatment: Yes Reason for Co-Treatment: Complexity of the patient's impairments (multi-system involvement);For patient/therapist safety;To address functional/ADL transfers   OT goals addressed during session: ADL's and self-care;Strengthening/ROM  AM-PAC OT "6 Clicks" Daily Activity     Outcome Measure Help from another person eating meals?: A Little Help from another person taking care of personal grooming?: A Little Help from another person toileting, which includes using toliet, bedpan, or urinal?: Total Help from another person bathing (including washing, rinsing, drying)?: Total Help from another person to put on and taking off regular upper body clothing?: A Lot Help from another person  to put on and taking off regular lower body clothing?: Total 6 Click Score: 11   End of Session Equipment Utilized During Treatment: Rolling walker (2 wheels);Oxygen (pt 97% on 2L O2 at this time) Nurse Communication: Mobility status;Other (comment) (aware of need for eating assist)  Activity Tolerance: Patient limited by fatigue Patient left: in bed;with call bell/phone within reach;with nursing/sitter in room (RN aware of pt status)  OT Visit Diagnosis: Unsteadiness on feet (R26.81);Other abnormalities of gait and mobility (R26.89)                Time: 1610-9604 OT Time Calculation (min): 31 min Charges:  OT General Charges $OT Visit: 1 Visit OT Evaluation $OT Eval Moderate Complexity: 1 Mod OT Treatments $Self Care/Home Management : 8-22 mins  Linward Foster, MS, OTR/L  Alvester Morin 04/04/2023, 10:23 AM

## 2023-04-05 DIAGNOSIS — I6359 Cerebral infarction due to unspecified occlusion or stenosis of other cerebral artery: Secondary | ICD-10-CM | POA: Diagnosis not present

## 2023-04-05 LAB — CBC
HCT: 23.9 % — ABNORMAL LOW (ref 39.0–52.0)
Hemoglobin: 7.5 g/dL — ABNORMAL LOW (ref 13.0–17.0)
MCH: 26.1 pg (ref 26.0–34.0)
MCHC: 31.4 g/dL (ref 30.0–36.0)
MCV: 83.3 fL (ref 80.0–100.0)
Platelets: 175 10*3/uL (ref 150–400)
RBC: 2.87 MIL/uL — ABNORMAL LOW (ref 4.22–5.81)
RDW: 18 % — ABNORMAL HIGH (ref 11.5–15.5)
WBC: 11.7 10*3/uL — ABNORMAL HIGH (ref 4.0–10.5)
nRBC: 0 % (ref 0.0–0.2)

## 2023-04-05 LAB — PHOSPHORUS: Phosphorus: 1.6 mg/dL — ABNORMAL LOW (ref 2.5–4.6)

## 2023-04-05 LAB — BASIC METABOLIC PANEL
Anion gap: 4 — ABNORMAL LOW (ref 5–15)
BUN: 24 mg/dL — ABNORMAL HIGH (ref 8–23)
CO2: 22 mmol/L (ref 22–32)
Calcium: 8.9 mg/dL (ref 8.9–10.3)
Chloride: 115 mmol/L — ABNORMAL HIGH (ref 98–111)
Creatinine, Ser: 1.05 mg/dL (ref 0.61–1.24)
GFR, Estimated: >60 mL/min (ref >60)
Glucose, Bld: 97 mg/dL (ref 70–99)
Potassium: 3.6 mmol/L (ref 3.5–5.1)
Sodium: 141 mmol/L (ref 135–145)

## 2023-04-05 LAB — MAGNESIUM: Magnesium: 1.6 mg/dL — ABNORMAL LOW (ref 1.7–2.4)

## 2023-04-05 MED ORDER — MAGNESIUM SULFATE 4 GM/100ML IV SOLN
4.0000 g | Freq: Once | INTRAVENOUS | Status: AC
  Administered 2023-04-05: 4 g via INTRAVENOUS
  Filled 2023-04-05: qty 100

## 2023-04-05 MED ORDER — POTASSIUM PHOSPHATES 15 MMOLE/5ML IV SOLN
30.0000 mmol | Freq: Once | INTRAVENOUS | Status: AC
  Administered 2023-04-05: 30 mmol via INTRAVENOUS
  Filled 2023-04-05: qty 10

## 2023-04-05 NOTE — Progress Notes (Signed)
Speech Language Pathology Treatment: Dysphagia  Patient Details Name: Richard Carter MRN: [id number] DOB: Feb 01, 1941 Today's Date: 04/05/2023 Time: 0850-0930 SLP Time Calculation (min) (ACUTE ONLY): 40 min  Assessment / Plan / Recommendation Clinical Impression  Patient seen for skilled ST treatment for dysphagia and to assess diet tolerance. Pt transferred from ICU yesterday. Remains afebrile, currently on 2L Sp02 via [place]. RN gave medications in puree as SLP arrived; continues with prolonged bolus formation, and appearance of delayed swallow. Following oral care, pt consumed 12 oz nectar thick liquids via cup, ~8 oz puree from breakfast tray (applesauce, 1/2 magic cup, pancake) with assistance for feeding. Pt attempted self-feeding but required hand over hand assistance. Cough occurred x 2 with larger sips of nectar (none when SLP assisted with smaller boluses), otherwise no overt signs of aspiration. Patient remained engaged throughout session and was able to answer simple biographical questions, maintained appropriate attention to POs. Recommend continue current diet of dysphagia 1/nectar due, meds crushed, continue supervision and assistance with feeding. SLP will continue to follow; if tolerance remains stable, SLP needs may be addressed in next venue of care.      HPI HPI: This is a 82 y.o. male  Richard Carter is a 82 y.o. male with medical history significant of Dementia, ETOH abuse, stroke, high-grade left carotid artery stenosis, seizure, A-fib on Eliquis, hypertension, hyperlipidemia, anemia, former smoker, ETOH abuse in remission for more than 3 years, who presents with slurred speech. Patient was recently hospitalized from 3/24-3/29 due to stroke.  Patient was found to have high-grade left carotid artery stenosis.  Dr. Gilda Crease of [facility] is following up with the patient. Patient was seen and in the process of bring scheduled outpatient for an Open Carotid Endarterectomy in the coming weeks.  Current MRI revealed Acute infarct in the postcentral gyrus on the left 2. 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   CXR on 03/31/2023: Underinflation.  Chronic changes.  No acute cardiopulmonary disease. Similar to CXR at admit.      SLP Plan  Continue with current plan of care      Recommendations for follow up therapy are one component of a multi-disciplinary discharge planning process, led by the attending physician.  Recommendations may be updated based on patient status, additional functional criteria and insurance authorization.    Recommendations  Diet recommendations: Dysphagia 1 (puree);Nectar-thick liquid Liquids provided via: Teaspoon;Cup;No straw Medication Administration: Crushed with puree Supervision: Staff to assist with self feeding;Full supervision/cueing for compensatory strategies Compensations: Minimize environmental distractions;Slow rate;Small sips/bites;Lingual sweep for clearance of pocketing;Multiple dry swallows after each bite/sip Postural Changes and/or Swallow Maneuvers: Out of bed for meals;Seated upright 90 degrees;Upright 30-60 min after meal                  Oral care QID;Oral care before and after PO;Staff/trained caregiver to provide oral care (recommend oral suction available to assist with oral care and clearing secretions)   Frequent or constant Supervision/Assistance Dysphagia, oropharyngeal phase (R13.12) (baseline dementia, ETOH abuse, CVA)     Continue with current plan of care   Rondel Baton, MS, CCC-SLP Speech-Language Pathologist Office: [phone] [facility]: [phone]    Arlana Lindau  04/05/2023, 10:00 AM

## 2023-04-05 NOTE — Progress Notes (Signed)
    Subjective  - POD # 3, status post left carotid endarterectomy  No complaints this morning Denies any acute neurologic changes  Physical Exam:  Left neck incision without hematoma No changes with aphasia Good grip strength in both upper extremities. Able to lift both feet off of the bed      Assessment/Plan:  POD # 3  Symptomatic left carotid stenosis: The patient has undergone successful left carotid endarterectomy.  His neurologic exam remains at baseline.  Continue with supportive care including PT, OT, speech therapy and rehab.  Back on home Eliquis for A-fib.  Continue aspirin and statin  Wells Sirron Francesconi 04/05/2023 3:54 PM --  Vitals:   04/05/23 0753 04/05/23 1214  BP: (!) 149/82 (!) 128/58  Pulse: (!) 54 75  Resp: 18 18  Temp: 98.4 F (36.9 C) 97.7 F (36.5 C)  SpO2: 92% 96%    Intake/Output Summary (Last 24 hours) at 04/05/2023 1554 Last data filed at 04/05/2023 1500 Gross per 24 hour  Intake 744.51 ml  Output 1150 ml  Net -405.49 ml     Laboratory CBC    Component Value Date/Time   WBC 11.7 (H) 04/05/2023 0602   HGB 7.5 (L) 04/05/2023 0602   HGB 12.9 (L) 10/25/2014 1048   HCT 23.9 (L) 04/05/2023 0602   HCT 38.6 (L) 10/25/2014 1048   PLT 175 04/05/2023 0602   PLT 93 (L) 10/25/2014 1048    BMET    Component Value Date/Time   NA 141 04/05/2023 0602   NA 133 (L) 10/25/2014 1048   K 3.6 04/05/2023 0602   K 3.3 (L) 10/25/2014 1048   CL 115 (H) 04/05/2023 0602   CL 92 (L) 10/25/2014 1048   CO2 22 04/05/2023 0602   CO2 30 10/25/2014 1048   GLUCOSE 97 04/05/2023 0602   GLUCOSE 125 (H) 10/25/2014 1048   BUN 24 (H) 04/05/2023 0602   BUN 18 10/25/2014 1048   CREATININE 1.05 04/05/2023 0602   CREATININE 1.02 10/25/2014 1048   CALCIUM 8.9 04/05/2023 0602   CALCIUM 8.9 10/25/2014 1048   GFRNONAA >60 04/05/2023 0602   GFRNONAA >60 10/25/2014 1048   GFRNONAA >60 06/09/2014 0431   GFRAA >60 02/24/2020 0401   GFRAA >60 10/25/2014 1048   GFRAA  >60 06/09/2014 0431    COAG Lab Results  Component Value Date   INR 1.5 (H) 03/28/2023   INR 1.4 (H) 03/27/2023   INR 1.1 06/27/2022   No results found for: "PTT"  Antibiotics Anti-infectives (From admission, onward)    Start     Dose/Rate Route Frequency Ordered Stop   04/02/23 1230  ceFAZolin (ANCEF) IVPB 2g/100 mL premix        2 g 200 mL/hr over 30 Minutes Intravenous Every 8 hours 04/02/23 1135 04/02/23 2001   04/02/23 0137  ceFAZolin (ANCEF) IVPB 2g/100 mL premix  Status:  Discontinued        2 g 200 mL/hr over 30 Minutes Intravenous 30 min pre-op 04/02/23 0137 04/02/23 1053        V. Charlena Cross, M.D., [facility] [place] Office: [phone] Pager:  [phone]

## 2023-04-05 NOTE — Progress Notes (Signed)
[facility] Progress Note  Patient: Richard Carter    GNF:[id number]  DOA: 03/27/2023     Date of Service: the patient was seen and examined on 04/05/2023  Chief Complaint  Patient presents with   Aphasia   Brief hospital course: Richard Carter is a 82 y.o. 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  Found to have recurrent stroke.  Neurology and vascular surgery involved.  Plan for left CEA on 5/7.  Currently on IV heparin.     Assessment and Plan:  Stenosis of left carotid artery:  pt has high-grade left carotid arteries stenosis -consulted Dr. Wyn Quaker, vascular surgery, patient was already following as an outpatient.   S/p left carotid endarterectomy done on 04/02/2023. S/p Heparin IV infusion, d/c'd on 5/9 and resumed Eliquis 5 mg p.o. twice daily home dose Started aspirin 81 mg p.o. daily  Bradycardia, discussed with cardiology, stated that it is common after carotid endarterectomy, no need of pacemaker.  Continue to monitor.  Acute ischemic stroke MRI-brain showed acute infarct in the postcentral gyrus on the left. Neurology following. Carotid Dopplers unchanged from prior stenosis. S/p permissive hypertension. S/p heparin IV infusion, resumed Eliquis.    Cholesterol medications restarted as diet has been upgraded. PT OT were also consulted. Monitor on telemetry. Recently had an echocardiogram with preserved EF without any wall motion abnormality. Palliative care has been consulted.     AKI most likely secondary to dehydration Patient is on dysphagia 1 diet with nectar thick liquids S/p IV fluid for gentle hydration Monitor renal functions daily 5/11 creatinine 1.05, improved, AKI resolved.   Dysphagia Patient had multiple evaluation by SLP,  recommended dysphagia 1 diet with nectar thick liquids.  Patient needs assistance with feeding, instructions were given to RN by speech therapist.     paroxysmal atrial fibrillation Heart rate well-controlled S/p heparin IV infusion, resumed Eliquis 5 mg p.o. twice daily on 5/9. Discontinued beta-blocker due to bradycardia  Started amlodipine 5 mg p.o. daily   seizures (HCC) -Seizure precaution -As needed Ativan for seizure -Continue home Keppra, now IV.   HTN (hypertension): Allowed permissive hypertension for 48 hrs -Resumed amlodipine Continue to hold Cozaar Discontinued metoprolol due to bradycardia 5/10 patient is becoming hypertensive, added amlodipine 5 mg every evening with holding parameters to skip the dose if SBP less than 140 mmHg  HLD: - Continue his home atorvastatin LDL <70   Dementia (HCC): No behavior disturbance -Fall precaution  # Hypophosphatemia most likely refeeding syndrome, Phos repleted. # Hypomagnesemia, mag repleted. Monitor electrolytes and replete as needed.  Hyperkalemia, potassium 5.2 slightly elevated Started Lokelma 10 g p.o. 3 times daily x 2 doses Continue low potassium diet, monitor potassium level daily 5/10 hyperkalemia resolved  Metabolic acidosis, CO2 18,  CO2 21, improved after oral bicarbonate.  Anemia due to iron deficiency and B12 deficiency Hb 7.0-7.2, remains low, 1 unit PRBC transfused on 5/9 5/10 Hb 8.1, posttransfusion. Monitor H&H and transfuse if hemoglobin less than 7   Iron deficiency anemia, transferrin saturation 6% S/p ferric gluconate 250 mg IV daily for 4 doses, followed by oral supplement with vitamin C. Folic acid level 8.7, within normal range Monitor H&H and transfuse if hemoglobin less than 7  Vitamin B12 deficiency, B12 level 165, goal >400, started vitamin B12 1000 mcg IM injection daily during hospital  stay, followed by oral supplement.  Follow-up PCP to repeat vitamin B12 level after 3 to 6  months.   Body mass index is 21.44 kg/m.  Interventions:      Diet: Dysphagia 1 diet with nectar thick liquids DVT Prophylaxis: Therapeutic Anticoagulation with Eliquis    Advance goals of care discussion: DNR  Family Communication: family was not present at bedside, at the time of interview.  The pt provided permission to discuss medical plan with the family. Opportunity was given to ask question and all questions were answered satisfactorily.   Disposition:  Pt is from Home, admitted with stroke and found to have left carotid stenosis, s/p CEA done on 5/8 by vascular surgery. S/p Hep gtt. gradually improving, most likely pt will be stable to discharge in 1 to 2 days Discharge to SNF, TOC following for placement   Subjective: No significant events overnight, patient was resting comfortably, denied any complaints.  Patient was trying to get out of the bed, patient was advised to not get out of the bed by himself, ask nurse for the help.  Physical Exam: General: NAD, lying comfortably Appear in no distress, affect appropriate Eyes: PERRLA ENT: Oral Mucosa Clear, moist  Neck: no JVD,  Cardiovascular: S1 and S2 Present, no Murmur,  Respiratory: good respiratory effort, Bilateral Air entry equal and Decreased, no Crackles, no wheezes Abdomen: Bowel Sound present, Soft and no tenderness,  Skin: no rashes Extremities: no Pedal edema, no calf tenderness Neurologic: Residual weakness on the right side due to prior stroke. Gait not checked due to patient safety concerns  Vitals:   04/05/23 0425 04/05/23 0712 04/05/23 0753 04/05/23 1214  BP: 105/72  (!) 149/82 (!) 128/58  Pulse: 65  (!) 54 75  Resp: 20  18 18   Temp: 98.5 F (36.9 C)  98.4 F (36.9 C) 97.7 F (36.5 C)  TempSrc: Oral  Oral Oral  SpO2: 97%  92% 96%  Weight:  70.4 kg    Height:        Intake/Output Summary (Last 24 hours) at 04/05/2023 1512 Last data filed at 04/05/2023 1400 Gross per 24 hour  Intake 690 ml   Output 1150 ml  Net -460 ml   Filed Weights   04/02/23 1058 04/04/23 0300 04/05/23 0712  Weight: 66.3 kg 69 kg 70.4 kg    Data Reviewed: I have personally reviewed and interpreted daily labs, tele strips, imagings as discussed above. I reviewed all nursing notes, pharmacy notes, vitals, pertinent old records I have discussed plan of care as described above with RN and patient/family.  CBC: Recent Labs  Lab 04/02/23 0605 04/02/23 1151 04/02/23 2044 04/03/23 0359 04/03/23 1813 04/04/23 0344 04/05/23 0602  WBC 8.5  --   --  13.3* 11.3* 12.2* 11.7*  HGB 7.0*   < > 7.7* 7.2* 8.0* 8.1* 7.5*  HCT 22.4*   < > 25.3* 23.1* 26.3* 25.6* 23.9*  MCV 82.7  --   --  81.6 85.1 83.9 83.3  PLT 199  --   --  170 165 180 175   < > = values in this interval not displayed.   Basic Metabolic Panel: Recent Labs  Lab 04/01/23 0237 04/02/23 0605 04/03/23 0359 04/03/23 0533 04/04/23 0344 04/05/23 0602  NA 139 141 139  --  142 141  K 4.2 3.9 5.4* 5.2* 4.5 3.6  CL 110 115* 115*  --  115* 115*  CO2 21* 22 18*  --  21* 22  GLUCOSE 120* 93 109*  --  112* 97  BUN 34* 29* 29*  --  37* 24*  CREATININE 1.38* 1.07 1.17  --  1.25* 1.05  CALCIUM 9.4 9.3 8.9  --  8.9 8.9  MG 1.6* 2.1 1.7  --  1.8 1.6*  PHOS 3.6 2.9 3.2  --  2.6 1.6*    Studies: No results found.  Scheduled Meds:  sodium chloride   Intravenous Once   sodium chloride   Intravenous Once   amLODipine  5 mg Oral Daily   amLODipine  5 mg Oral QPM   apixaban  5 mg Oral BID   ascorbic acid  500 mg Oral Daily   aspirin EC  81 mg Oral Q0600   atorvastatin  20 mg Oral QPM   Chlorhexidine Gluconate Cloth  6 each Topical Daily   cyanocobalamin  1,000 mcg Intramuscular Daily   Followed by   Melene Muller ON 04/08/2023] vitamin B-12  1,000 mcg Oral Daily   docusate sodium  100 mg Oral Daily   feeding supplement (NEPRO CARB STEADY)  237 mL Oral TID BM   iron polysaccharides  150 mg Oral Daily   levETIRAcetam  500 mg Oral Q12H   multivitamin  with minerals  1 tablet Oral Daily   pantoprazole  40 mg Oral Daily   sodium chloride flush  10-40 mL Intracatheter Q12H   Continuous Infusions:  sodium chloride     levETIRAcetam Stopped (04/04/23 1013)   potassium PHOSPHATE IVPB (in mmol) 30 mmol (04/05/23 1014)   PRN Meds: sodium chloride, acetaminophen **OR** acetaminophen, alum & mag hydroxide-simeth, food thickener, guaiFENesin-dextromethorphan, hydrALAZINE **OR** hydrALAZINE, HYDROmorphone (DILAUDID) injection, LORazepam, magnesium hydroxide, ondansetron (ZOFRAN) IV, oxyCODONE, phenol, sodium chloride flush, sorbitol  Time spent: 35 minutes  Author: Gillis Santa. MD [facility] 04/05/2023 3:12 PM  To reach On-call, see care teams to locate the attending and reach out to them via [email or web address]. If 7PM-7AM, please contact night-coverage If you still have difficulty reaching the attending provider, please page the [facility] (Director on Call) for [facility] on amion for assistance.

## 2023-04-06 DIAGNOSIS — I6359 Cerebral infarction due to unspecified occlusion or stenosis of other cerebral artery: Secondary | ICD-10-CM | POA: Diagnosis not present

## 2023-04-06 LAB — CBC
HCT: 25 % — ABNORMAL LOW (ref 39.0–52.0)
Hemoglobin: 7.8 g/dL — ABNORMAL LOW (ref 13.0–17.0)
MCH: 25.9 pg — ABNORMAL LOW (ref 26.0–34.0)
MCHC: 31.2 g/dL (ref 30.0–36.0)
MCV: 83.1 fL (ref 80.0–100.0)
Platelets: 199 10*3/uL (ref 150–400)
RBC: 3.01 MIL/uL — ABNORMAL LOW (ref 4.22–5.81)
RDW: 18.5 % — ABNORMAL HIGH (ref 11.5–15.5)
WBC: 9.9 10*3/uL (ref 4.0–10.5)
nRBC: 0 % (ref 0.0–0.2)

## 2023-04-06 LAB — BASIC METABOLIC PANEL
Anion gap: 4 — ABNORMAL LOW (ref 5–15)
BUN: 23 mg/dL (ref 8–23)
CO2: 22 mmol/L (ref 22–32)
Calcium: 8.9 mg/dL (ref 8.9–10.3)
Chloride: 113 mmol/L — ABNORMAL HIGH (ref 98–111)
Creatinine, Ser: 0.93 mg/dL (ref 0.61–1.24)
GFR, Estimated: >60 mL/min (ref >60)
Glucose, Bld: 100 mg/dL — ABNORMAL HIGH (ref 70–99)
Potassium: 3.4 mmol/L — ABNORMAL LOW (ref 3.5–5.1)
Sodium: 139 mmol/L (ref 135–145)

## 2023-04-06 LAB — MAGNESIUM: Magnesium: 2 mg/dL (ref 1.7–2.4)

## 2023-04-06 LAB — PHOSPHORUS: Phosphorus: 1.8 mg/dL — ABNORMAL LOW (ref 2.5–4.6)

## 2023-04-06 MED ORDER — POTASSIUM PHOSPHATES 15 MMOLE/5ML IV SOLN
30.0000 mmol | Freq: Once | INTRAVENOUS | Status: AC
  Administered 2023-04-06: 30 mmol via INTRAVENOUS
  Filled 2023-04-06: qty 10

## 2023-04-06 MED ORDER — POTASSIUM CHLORIDE CRYS ER 20 MEQ PO TBCR
40.0000 meq | EXTENDED_RELEASE_TABLET | Freq: Once | ORAL | Status: AC
  Administered 2023-04-06: 40 meq via ORAL
  Filled 2023-04-06: qty 2

## 2023-04-06 NOTE — TOC Progression Note (Signed)
Transition of Care [facility]) - Progression Note    Patient Details  Name: Richard Carter MRN: [id number] Date of Birth: 1941/06/03  [facility] CM/SW Contact  Kemper Durie, RN Phone Number: 04/06/2023, 10:37 AM  Clinical Narrative:     Per MD, patient should be ready to discharge to [facility] tomorrow.  Auth process started, [place] ID [id number].  Expected Discharge Plan: Home w Home Health Services Barriers to Discharge: Continued Medical Work up  Expected Discharge Plan and Services       Living arrangements for the past 2 months: Single Family Home                             HH Agency: [facility] Date [facility] Agency Contacted: 03/29/23   Representative spoke with at [facility] Agency: [name]   Social Determinants of Health (SDOH) Interventions SDOH Screenings   Food Insecurity: No Food Insecurity (02/16/2023)  Housing: Low Risk  (02/16/2023)  Transportation Needs: No Transportation Needs (02/16/2023)  Utilities: Not At Risk (02/16/2023)  Tobacco Use: Medium Risk (04/03/2023)    Readmission Risk Interventions    04/21/2022    1:55 PM  Readmission Risk Prevention Plan  Post Dischage Appt Complete  Medication Screening Complete  Transportation Screening Complete

## 2023-04-06 NOTE — Progress Notes (Signed)
Physical Therapy Treatment Patient Details Name: Richard Carter MRN: [id number] DOB: September 26, 1941 Today's Date: 04/06/2023   History of Present Illness Pt is an 82 y.o. male presenting to hospital 03/27/23 with sudden onset of difficulties with speech; R sided weakness also noted but resolved; R sided sensation changes noted as well as facial droop.  Imaging showing acute infarct in the postcentral gyrus on L.  Pt admitted with stroke, stenosis of L carotid artery, a-fib, and htn.  Recent hospitalization 3/24-3/29 for stroke.  PMH includes alcohol abuse, dementia, htn, seizures, stroke (with L sided hemiparesis), a-fib, AKI, pubic ramus and acetabular fx 2020, UTI, chronic L middle finger contracture.  Per notes pt also with h/o R UE weakness. Is now s/p carotid endarterectomy 04/02/2023.    PT Comments    Pt is making gradual progress towards goals with ability to perform bed mobility/transfers with only 1 assist. Would anticipate need for +2 for further progression of mobility and still demonstrates difficulty with R grip on RW. Removed O2 with vitals stable. Assisted in finding a "western" for patient to watch while waiting for breakfast. Anticipate chair transfer next session.   Recommendations for follow up therapy are one component of a multi-disciplinary discharge planning process, led by the attending physician.  Recommendations may be updated based on patient status, additional functional criteria and insurance authorization.  Follow Up Recommendations  Can patient physically be transported by private vehicle: No    Assistance Recommended at Discharge Frequent or constant Supervision/Assistance  Patient can return home with the following Assistance with cooking/housework;Direct supervision/assist for medications management;Assist for transportation;Help with stairs or ramp for entrance;Two people to help with walking and/or transfers;A lot of help with bathing/dressing/bathroom;Assistance with  feeding;Direct supervision/assist for financial management   Equipment Recommendations  Rolling walker (2 wheels);Wheelchair (measurements PT);Wheelchair cushion (measurements PT)    Recommendations for Other Services       Precautions / Restrictions Precautions Precautions: Fall Restrictions Weight Bearing Restrictions: No     Mobility  Bed Mobility Overal bed mobility: Needs Assistance Bed Mobility: Supine to Sit, Sit to Supine     Supine to sit: Mod assist, HOB elevated Sit to supine: Mod assist   General bed mobility comments: follows commands and once seated at EOB, only requires supervision to maintain balance.    Transfers Overall transfer level: Needs assistance Equipment used: Rolling walker (2 wheels) Transfers: Sit to/from Stand Sit to Stand: Mod assist           General transfer comment: able to stand x 2 with assist with R hemibody. Unable to grip RW with R hand and requires knee blocking. Able to achieve upright posture and therapist able to adjust bed linens. Attempted to perform lateral scoots-unable without more physical assist    Ambulation/Gait               General Gait Details: not safe to perform ambulation this date.   Stairs             Wheelchair Mobility    Modified Rankin (Stroke Patients Only)       Balance Overall balance assessment: Needs assistance Sitting-balance support: Feet supported Sitting balance-Leahy Scale: Fair Sitting balance - Comments: able to maintain balance while washing face   Standing balance support: Single extremity supported Standing balance-Leahy Scale: Poor                              Cognition Arousal/Alertness: Awake/alert  Behavior During Therapy: Flat affect Overall Cognitive Status: History of cognitive impairments - at baseline                                 General Comments: able to follow commands. Appears with slight improvement in speech, however  still presents with aphasia.        Exercises Other Exercises Other Exercises: supine/seated ther-ex performed on B LE including SLRs, hip abd/add, and AP. Seated LAQ. 10 reps x B sides. Other Exercises: once seated at EOB, able to wash face with L hand with warm washcloth    General Comments General comments (skin integrity, edema, etc.): Removed O2 with sats at 90-93% on RA with exertion. Primary RN notified,aware      Pertinent Vitals/Pain Pain Assessment Pain Assessment: Faces Faces Pain Scale: Hurts even more Pain Location: R wrist Pain Descriptors / Indicators: Aching Pain Intervention(s): Limited activity within patient's tolerance, Repositioned    Home Living                          Prior Function            PT Goals (current goals can now be found in the care plan section) Acute Rehab PT Goals Patient Stated Goal: improved mobility PT Goal Formulation: With patient Time For Goal Achievement: 04/18/23 Potential to Achieve Goals: Fair Progress towards PT goals: Progressing toward goals    Frequency    Min 4X/week      PT Plan Current plan remains appropriate    Co-evaluation              AM-PAC PT "6 Clicks" Mobility   Outcome Measure  Help needed turning from your back to your side while in a flat bed without using bedrails?: A Lot Help needed moving from lying on your back to sitting on the side of a flat bed without using bedrails?: A Lot Help needed moving to and from a bed to a chair (including a wheelchair)?: Total Help needed standing up from a chair using your arms (e.g., wheelchair or bedside chair)?: A Lot Help needed to walk in hospital room?: Total Help needed climbing 3-5 steps with a railing? : Total 6 Click Score: 9    End of Session Equipment Utilized During Treatment: Gait belt Activity Tolerance: Patient limited by fatigue Patient left: in bed;with bed alarm set Nurse Communication: Mobility status PT Visit  Diagnosis: Unsteadiness on feet (R26.81);Other abnormalities of gait and mobility (R26.89);Muscle weakness (generalized) (M62.81)     Time: 1610-9604 PT Time Calculation (min) (ACUTE ONLY): 25 min  Charges:  $Therapeutic Exercise: 8-22 mins $Therapeutic Activity: 8-22 mins                     Elizabeth Palau, PT, DPT, GCS [phone]    Tressa Maldonado 04/06/2023, 10:09 AM

## 2023-04-06 NOTE — Progress Notes (Signed)
    Subjective  - POD #4, s/p left CEA  No acute overnight events Denies any new neurological changes   Physical Exam:  Right arm still weak Good lower extremity strength aphasia       Assessment/Plan:  POD #4  Symptomatic left carotid stenosis: The patient has undergone successful left carotid endarterectomy.  His neurologic exam remains at baseline.  Continue with supportive care including PT, OT, speech therapy and rehab.   Back on home Eliquis for A-fib.  Continue aspirin and statin  Wells Adiva Boettner 04/06/2023 12:02 PM --  Vitals:   04/06/23 0430 04/06/23 0900  BP: (!) 163/81 (!) 147/86  Pulse: 66 61  Resp: 16 16  Temp: 98.4 F (36.9 C) 98.2 F (36.8 C)  SpO2: 94% 92%    Intake/Output Summary (Last 24 hours) at 04/06/2023 1202 Last data filed at 04/06/2023 0820 Gross per 24 hour  Intake 624.51 ml  Output 2100 ml  Net -1475.49 ml     Laboratory CBC    Component Value Date/Time   WBC 9.9 04/06/2023 0610   HGB 7.8 (L) 04/06/2023 0610   HGB 12.9 (L) 10/25/2014 1048   HCT 25.0 (L) 04/06/2023 0610   HCT 38.6 (L) 10/25/2014 1048   PLT 199 04/06/2023 0610   PLT 93 (L) 10/25/2014 1048    BMET    Component Value Date/Time   NA 139 04/06/2023 0610   NA 133 (L) 10/25/2014 1048   K 3.4 (L) 04/06/2023 0610   K 3.3 (L) 10/25/2014 1048   CL 113 (H) 04/06/2023 0610   CL 92 (L) 10/25/2014 1048   CO2 22 04/06/2023 0610   CO2 30 10/25/2014 1048   GLUCOSE 100 (H) 04/06/2023 0610   GLUCOSE 125 (H) 10/25/2014 1048   BUN 23 04/06/2023 0610   BUN 18 10/25/2014 1048   CREATININE 0.93 04/06/2023 0610   CREATININE 1.02 10/25/2014 1048   CALCIUM 8.9 04/06/2023 0610   CALCIUM 8.9 10/25/2014 1048   GFRNONAA >60 04/06/2023 0610   GFRNONAA >60 10/25/2014 1048   GFRNONAA >60 06/09/2014 0431   GFRAA >60 02/24/2020 0401   GFRAA >60 10/25/2014 1048   GFRAA >60 06/09/2014 0431    COAG Lab Results  Component Value Date   INR 1.5 (H) 03/28/2023   INR 1.4 (H)  03/27/2023   INR 1.1 06/27/2022   No results found for: "PTT"  Antibiotics Anti-infectives (From admission, onward)    Start     Dose/Rate Route Frequency Ordered Stop   04/02/23 1230  ceFAZolin (ANCEF) IVPB 2g/100 mL premix        2 g 200 mL/hr over 30 Minutes Intravenous Every 8 hours 04/02/23 1135 04/02/23 2001   04/02/23 0137  ceFAZolin (ANCEF) IVPB 2g/100 mL premix  Status:  Discontinued        2 g 200 mL/hr over 30 Minutes Intravenous 30 min pre-op 04/02/23 0137 04/02/23 1053        V. Charlena Cross, M.D., [facility] [place] Office: [phone] Pager:  [phone]

## 2023-04-06 NOTE — Progress Notes (Signed)
[facility] Progress Note  Patient: Richard Carter    WUJ:[id number]  DOA: 03/27/2023     Date of Service: the patient was seen and examined on 04/06/2023  Chief Complaint  Patient presents with   Aphasia   Brief hospital course: Richard Carter is a 82 y.o. 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  Found to have recurrent stroke.  Neurology and vascular surgery involved.  Plan for left CEA on 5/7.  Currently on IV heparin.     Assessment and Plan:  Stenosis of left carotid artery:  pt has high-grade left carotid arteries stenosis -consulted Dr. Wyn Quaker, vascular surgery, patient was already following as an outpatient.   S/p left carotid endarterectomy done on 04/02/2023. S/p Heparin IV infusion, d/c'd on 5/9 and resumed Eliquis 5 mg p.o. twice daily home dose Started aspirin 81 mg p.o. daily  Bradycardia, discussed with cardiology, stated that it is common after carotid endarterectomy, no need of pacemaker.  Continue to monitor.  Acute ischemic stroke MRI-brain showed acute infarct in the postcentral gyrus on the left. Neurology following. Carotid Dopplers unchanged from prior stenosis. S/p permissive hypertension. S/p heparin IV infusion, resumed Eliquis.    Cholesterol medications restarted as diet has been upgraded. PT OT were also consulted. Monitor on telemetry. Recently had an echocardiogram with preserved EF without any wall motion abnormality. Palliative care has been consulted.  5/12 patient was complaining of pain in the dorsum of right hand, no signs of infection.  No erythema or tenderness. We will continue to monitor.   AKI most likely secondary to dehydration Patient is on dysphagia 1 diet with nectar thick liquids S/p IV fluid for gentle  hydration Monitor renal functions daily 5/12 creatinine 0.93  improved, AKI resolved.   Dysphagia Patient had multiple evaluation by SLP, recommended dysphagia 1 diet with nectar thick liquids.  Patient needs assistance with feeding, instructions were given to RN by speech therapist.     paroxysmal atrial fibrillation Heart rate well-controlled S/p heparin IV infusion, resumed Eliquis 5 mg p.o. twice daily on 5/9. Discontinued beta-blocker due to bradycardia  Started amlodipine 5 mg p.o. daily   seizures (HCC) -Seizure precaution -As needed Ativan for seizure -Continue home Keppra, now IV.   HTN (hypertension): Allowed permissive hypertension for 48 hrs -Resumed amlodipine Continue to hold Cozaar Discontinued metoprolol due to bradycardia 5/10 patient is becoming hypertensive, added amlodipine 5 mg every evening with holding parameters to skip the dose if SBP less than 140 mmHg  HLD: - Continue his home atorvastatin LDL <70   Dementia (HCC): No behavior disturbance -Fall precaution  # Hypophosphatemia most likely refeeding syndrome, Phos repleted. # Hypomagnesemia, mag repleted. Monitor electrolytes and replete as needed.  Hyperkalemia, potassium 5.2 slightly elevated Started Lokelma 10 g p.o. 3 times daily x 2 doses Continue low potassium diet, monitor potassium level daily 5/10 hyperkalemia resolved 5/12 mild hypokalemia, potassium repleted.  Metabolic acidosis, CO2 18,  CO2 21, improved after oral bicarbonate.  Anemia due to iron deficiency and B12 deficiency Hb 7.0-7.2, remains low, 1 unit PRBC transfused on 5/9 5/10 Hb 8.1, posttransfusion. 5/12 Hb 7.8 Monitor H&H and transfuse if hemoglobin less than 7   Iron deficiency anemia, transferrin saturation 6% S/p ferric gluconate 250 mg IV daily for 4 doses, followed by oral supplement with vitamin C. Folic  acid level 8.7, within normal range Monitor H&H and transfuse if hemoglobin less than 7  Vitamin B12  deficiency, B12 level 165, goal >400, started vitamin B12 1000 mcg IM injection daily during hospital stay, followed by oral supplement.  Follow-up PCP to repeat vitamin B12 level after 3 to 6 months.   Body mass index is 21.44 kg/m.  Interventions:      Diet: Dysphagia 1 diet with nectar thick liquids DVT Prophylaxis: Therapeutic Anticoagulation with Eliquis    Advance goals of care discussion: DNR  Family Communication: family was not present at bedside, at the time of interview.  The pt provided permission to discuss medical plan with the family. Opportunity was given to ask question and all questions were answered satisfactorily.   Disposition:  Pt is from Home, admitted with stroke and found to have left carotid stenosis, s/p CEA done on 5/8 by vascular surgery. S/p Hep gtt. gradually improving, clinically stable medically optimized to discharge to SNF.   Discharge to SNF, when bed will be available.  TOC following for placement   Subjective: No significant events overnight, patient was complaining of pain on the dorsum of right hand, there is no sign of infection, no tenderness.  Patient was advised to keep it elevated and keep it moving.  Patient denies any chest pain or palpitation, no shortness of breath.  Did not offer any other complaints.  Patient is AO x 2, does not know year or month.   Physical Exam: General: NAD, lying comfortably Appear in no distress, affect appropriate Eyes: PERRLA ENT: Oral Mucosa Clear, moist  Neck: no JVD,  Cardiovascular: S1 and S2 Present, no Murmur,  Respiratory: good respiratory effort, Bilateral Air entry equal and Decreased, no Crackles, no wheezes Abdomen: Bowel Sound present, Soft and no tenderness,  Skin: no rashes Extremities: no Pedal edema, no calf tenderness Neurologic: Residual weakness on the right side due to prior stroke. Gait not checked due to patient safety concerns  Vitals:   04/06/23 0430 04/06/23 0713 04/06/23 0900  04/06/23 1233  BP: (!) 163/81  (!) 147/86 126/68  Pulse: 66  61 67  Resp: 16  16 16   Temp: 98.4 F (36.9 C)  98.2 F (36.8 C) 98 F (36.7 C)  TempSrc:      SpO2: 94%  92% 99%  Weight:  74.2 kg    Height:        Intake/Output Summary (Last 24 hours) at 04/06/2023 1528 Last data filed at 04/06/2023 1429 Gross per 24 hour  Intake 240 ml  Output 2100 ml  Net -1860 ml   Filed Weights   04/04/23 0300 04/05/23 0712 04/06/23 0713  Weight: 69 kg 70.4 kg 74.2 kg    Data Reviewed: I have personally reviewed and interpreted daily labs, tele strips, imagings as discussed above. I reviewed all nursing notes, pharmacy notes, vitals, pertinent old records I have discussed plan of care as described above with RN and patient/family.  CBC: Recent Labs  Lab 04/03/23 0359 04/03/23 1813 04/04/23 0344 04/05/23 0602 04/06/23 0610  WBC 13.3* 11.3* 12.2* 11.7* 9.9  HGB 7.2* 8.0* 8.1* 7.5* 7.8*  HCT 23.1* 26.3* 25.6* 23.9* 25.0*  MCV 81.6 85.1 83.9 83.3 83.1  PLT 170 165 180 175 199   Basic Metabolic Panel: Recent Labs  Lab 04/02/23 0605 04/03/23 0359 04/03/23 0533 04/04/23 0344 04/05/23 0602 04/06/23 0610  NA 141 139  --  142 141 139  K 3.9 5.4* 5.2* 4.5 3.6 3.4*  CL 115*  115*  --  115* 115* 113*  CO2 22 18*  --  21* 22 22  GLUCOSE 93 109*  --  112* 97 100*  BUN 29* 29*  --  37* 24* 23  CREATININE 1.07 1.17  --  1.25* 1.05 0.93  CALCIUM 9.3 8.9  --  8.9 8.9 8.9  MG 2.1 1.7  --  1.8 1.6* 2.0  PHOS 2.9 3.2  --  2.6 1.6* 1.8*    Studies: No results found.  Scheduled Meds:  sodium chloride   Intravenous Once   sodium chloride   Intravenous Once   amLODipine  5 mg Oral Daily   amLODipine  5 mg Oral QPM   apixaban  5 mg Oral BID   ascorbic acid  500 mg Oral Daily   aspirin EC  81 mg Oral Q0600   atorvastatin  20 mg Oral QPM   Chlorhexidine Gluconate Cloth  6 each Topical Daily   cyanocobalamin  1,000 mcg Intramuscular Daily   Followed by   Melene Muller ON 04/08/2023] vitamin  B-12  1,000 mcg Oral Daily   docusate sodium  100 mg Oral Daily   feeding supplement (NEPRO CARB STEADY)  237 mL Oral TID BM   iron polysaccharides  150 mg Oral Daily   levETIRAcetam  500 mg Oral Q12H   multivitamin with minerals  1 tablet Oral Daily   pantoprazole  40 mg Oral Daily   sodium chloride flush  10-40 mL Intracatheter Q12H   Continuous Infusions:  sodium chloride     levETIRAcetam 500 mg (04/06/23 0900)   potassium PHOSPHATE IVPB (in mmol) 30 mmol (04/06/23 1011)   PRN Meds: sodium chloride, acetaminophen **OR** acetaminophen, alum & mag hydroxide-simeth, food thickener, guaiFENesin-dextromethorphan, hydrALAZINE **OR** hydrALAZINE, HYDROmorphone (DILAUDID) injection, LORazepam, magnesium hydroxide, ondansetron (ZOFRAN) IV, oxyCODONE, phenol, sodium chloride flush, sorbitol  Time spent: 35 minutes  Author: Gillis Santa. MD [facility] 04/06/2023 3:28 PM  To reach On-call, see care teams to locate the attending and reach out to them via [email or web address]. If 7PM-7AM, please contact night-coverage If you still have difficulty reaching the attending provider, please page the [facility] (Director on Call) for [facility] on amion for assistance.

## 2023-04-07 DIAGNOSIS — G4700 Insomnia, unspecified: Secondary | ICD-10-CM | POA: Diagnosis not present

## 2023-04-07 DIAGNOSIS — R1310 Dysphagia, unspecified: Secondary | ICD-10-CM | POA: Diagnosis not present

## 2023-04-07 DIAGNOSIS — I10 Essential (primary) hypertension: Secondary | ICD-10-CM | POA: Diagnosis not present

## 2023-04-07 DIAGNOSIS — E785 Hyperlipidemia, unspecified: Secondary | ICD-10-CM | POA: Diagnosis not present

## 2023-04-07 DIAGNOSIS — I480 Paroxysmal atrial fibrillation: Secondary | ICD-10-CM | POA: Diagnosis not present

## 2023-04-07 DIAGNOSIS — D649 Anemia, unspecified: Secondary | ICD-10-CM | POA: Diagnosis not present

## 2023-04-07 DIAGNOSIS — I6359 Cerebral infarction due to unspecified occlusion or stenosis of other cerebral artery: Secondary | ICD-10-CM | POA: Diagnosis not present

## 2023-04-07 DIAGNOSIS — Z7189 Other specified counseling: Secondary | ICD-10-CM | POA: Diagnosis not present

## 2023-04-07 DIAGNOSIS — I639 Cerebral infarction, unspecified: Secondary | ICD-10-CM | POA: Diagnosis not present

## 2023-04-07 DIAGNOSIS — R5381 Other malaise: Secondary | ICD-10-CM | POA: Diagnosis not present

## 2023-04-07 DIAGNOSIS — G459 Transient cerebral ischemic attack, unspecified: Secondary | ICD-10-CM | POA: Diagnosis not present

## 2023-04-07 DIAGNOSIS — R001 Bradycardia, unspecified: Secondary | ICD-10-CM | POA: Diagnosis not present

## 2023-04-07 DIAGNOSIS — Z7401 Bed confinement status: Secondary | ICD-10-CM | POA: Diagnosis not present

## 2023-04-07 DIAGNOSIS — I6529 Occlusion and stenosis of unspecified carotid artery: Secondary | ICD-10-CM | POA: Diagnosis not present

## 2023-04-07 DIAGNOSIS — G40909 Epilepsy, unspecified, not intractable, without status epilepticus: Secondary | ICD-10-CM | POA: Diagnosis not present

## 2023-04-07 LAB — CBC
HCT: 25.7 % — ABNORMAL LOW (ref 39.0–52.0)
Hemoglobin: 8.2 g/dL — ABNORMAL LOW (ref 13.0–17.0)
MCH: 26.3 pg (ref 26.0–34.0)
MCHC: 31.9 g/dL (ref 30.0–36.0)
MCV: 82.4 fL (ref 80.0–100.0)
Platelets: 223 10*3/uL (ref 150–400)
RBC: 3.12 MIL/uL — ABNORMAL LOW (ref 4.22–5.81)
RDW: 18.9 % — ABNORMAL HIGH (ref 11.5–15.5)
WBC: 8.8 10*3/uL (ref 4.0–10.5)
nRBC: 0 % (ref 0.0–0.2)

## 2023-04-07 LAB — BASIC METABOLIC PANEL
Anion gap: 6 (ref 5–15)
BUN: 22 mg/dL (ref 8–23)
CO2: 24 mmol/L (ref 22–32)
Calcium: 8.8 mg/dL — ABNORMAL LOW (ref 8.9–10.3)
Chloride: 109 mmol/L (ref 98–111)
Creatinine, Ser: 0.83 mg/dL (ref 0.61–1.24)
GFR, Estimated: >60 mL/min (ref >60)
Glucose, Bld: 95 mg/dL (ref 70–99)
Potassium: 4.2 mmol/L (ref 3.5–5.1)
Sodium: 139 mmol/L (ref 135–145)

## 2023-04-07 LAB — PHOSPHORUS: Phosphorus: 2.7 mg/dL (ref 2.5–4.6)

## 2023-04-07 LAB — MAGNESIUM: Magnesium: 1.6 mg/dL — ABNORMAL LOW (ref 1.7–2.4)

## 2023-04-07 MED ORDER — AMLODIPINE BESYLATE 5 MG PO TABS
5.0000 mg | ORAL_TABLET | Freq: Once | ORAL | Status: AC
  Administered 2023-04-07: 5 mg via ORAL
  Filled 2023-04-07: qty 1

## 2023-04-07 MED ORDER — ASPIRIN 81 MG PO TBEC: 81.0000 mg | DELAYED_RELEASE_TABLET | Freq: Every day | ORAL | 12 refills | Status: DC

## 2023-04-07 MED ORDER — MAGNESIUM OXIDE 400 MG PO CAPS: 400.0000 mg | ORAL_CAPSULE | Freq: Two times a day (BID) | ORAL | 0 refills | Status: AC

## 2023-04-07 MED ORDER — AMLODIPINE BESYLATE 10 MG PO TABS: 10.0000 mg | ORAL_TABLET | Freq: Every day | ORAL | Status: DC

## 2023-04-07 MED ORDER — MAGNESIUM SULFATE 2 GM/50ML IV SOLN
2.0000 g | Freq: Once | INTRAVENOUS | Status: AC
  Administered 2023-04-07: 2 g via INTRAVENOUS
  Filled 2023-04-07: qty 50

## 2023-04-07 MED ORDER — ASCORBIC ACID 500 MG PO TABS: 500.0000 mg | ORAL_TABLET | Freq: Every day | ORAL | 0 refills | Status: AC

## 2023-04-07 MED ORDER — ACETAMINOPHEN 325 MG PO TABS: 325.0000 mg | ORAL_TABLET | Freq: Four times a day (QID) | ORAL | Status: DC | PRN

## 2023-04-07 MED ORDER — POLYSACCHARIDE IRON COMPLEX 150 MG PO CAPS: 150.0000 mg | ORAL_CAPSULE | Freq: Every day | ORAL | 0 refills | Status: DC

## 2023-04-07 MED ORDER — CYANOCOBALAMIN 1000 MCG PO TABS: 1000.0000 ug | ORAL_TABLET | Freq: Every day | ORAL | 0 refills | Status: AC

## 2023-04-07 NOTE — Progress Notes (Signed)
Daily Progress Note   Patient Name: Richard Carter       Date: 04/07/2023 DOB: Mar 16, 1941  Age: 82 y.o. MRN#: [id number] Attending Physician: Gillis Santa, MD Primary Care Physician: Marisue Ivan, MD Admit Date: 03/27/2023  Reason for Consultation/Follow-up: Establishing goals of care  Subjective: Notes and labs reviewed.  In to see patient.  He is currently sitting on bedside toilet.  Staff at bedside.  He denies complaint.  Patient working towards discharge.  PMT will continue to shadow for decline.  Length of Stay: 11  Current Medications: Scheduled Meds:   sodium chloride   Intravenous Once   sodium chloride   Intravenous Once   [START ON 04/08/2023] amLODipine  10 mg Oral Daily   apixaban  5 mg Oral BID   ascorbic acid  500 mg Oral Daily   aspirin EC  81 mg Oral Q0600   atorvastatin  20 mg Oral QPM   Chlorhexidine Gluconate Cloth  6 each Topical Daily   cyanocobalamin  1,000 mcg Intramuscular Daily   Followed by   Melene Muller ON 04/08/2023] vitamin B-12  1,000 mcg Oral Daily   docusate sodium  100 mg Oral Daily   feeding supplement (NEPRO CARB STEADY)  237 mL Oral TID BM   iron polysaccharides  150 mg Oral Daily   levETIRAcetam  500 mg Oral Q12H   multivitamin with minerals  1 tablet Oral Daily   pantoprazole  40 mg Oral Daily   sodium chloride flush  10-40 mL Intracatheter Q12H    Continuous Infusions:  sodium chloride     levETIRAcetam 500 mg (04/06/23 0900)    PRN Meds: sodium chloride, acetaminophen **OR** acetaminophen, alum & mag hydroxide-simeth, food thickener, guaiFENesin-dextromethorphan, hydrALAZINE **OR** hydrALAZINE, HYDROmorphone (DILAUDID) injection, LORazepam, magnesium hydroxide, ondansetron (ZOFRAN) IV, oxyCODONE, phenol, sodium chloride flush,  sorbitol  Physical Exam Pulmonary:     Effort: Pulmonary effort is normal.  Neurological:     Mental Status: He is alert.             Vital Signs: BP (!) 150/82 (BP Location: Right Arm)   Pulse 87   Temp (!) 97.4 F (36.3 C) (Oral)   Resp 20   Ht 5\' 7"  (1.702 m)   Wt 75 kg   SpO2 95%   BMI 25.90 kg/m  SpO2: SpO2:  95 % O2 Device: O2 Device: Room Air O2 Flow Rate: O2 Flow Rate (L/min): 2 L/min  Intake/output summary:  Intake/Output Summary (Last 24 hours) at 04/07/2023 1306 Last data filed at 04/07/2023 0900 Gross per 24 hour  Intake 1149.42 ml  Output 1650 ml  Net -500.58 ml   LBM: Last BM Date : 03/29/23 Baseline Weight: Weight: 62.1 kg Most recent weight: Weight: 75 kg    Patient Active Problem List   Diagnosis Date Noted   Goals of care, counseling/discussion 04/03/2023   Malnutrition of moderate degree 04/03/2023   Stroke (HCC) 03/27/2023   Stenosis of left carotid artery 02/20/2023   Altered mental status 02/20/2023   Acute stroke due to ischemia (HCC) 02/17/2023   Stroke-like symptom 02/16/2023   Leukocytosis 02/16/2023   Protein-calorie malnutrition, severe 05/28/2022   Dysphagia due to recent cerebrovascular accident 05/28/2022   Acute metabolic encephalopathy 05/27/2022   History of stroke 05/27/2022   Aspiration pneumonia (HCC) 04/23/2022   Nicotine dependence    Elevated troponin    Acute CVA (cerebrovascular accident) (HCC) 05/07/2021   Non compliance w medication regimen 05/07/2021   Dementia (HCC) 05/07/2021   AF (paroxysmal atrial fibrillation) (HCC) 05/07/2021   HTN (hypertension) 11/29/2020   Alcohol abuse 11/29/2020   AKI (acute kidney injury) (HCC) 11/29/2020   Atrial flutter with rapid ventricular response (HCC) 11/29/2020   Seizures (HCC) 02/22/2020   Pubic ramus fracture (HCC) 07/29/2019   Acetabular fracture (HCC) 07/29/2019   HLD (hyperlipidemia) 07/29/2019   GERD (gastroesophageal reflux disease) 07/29/2019   UTI (urinary tract  infection) 07/29/2019    Palliative Care Assessment & Plan    Recommendations/Plan: Patient working towards discharge. PMT will shadow for decline.   Code Status:    Code Status Orders  (From admission, onward)           Start     Ordered   03/27/23 1621  Do not attempt resuscitation (DNR)  Continuous       Question Answer Comment  If patient has no pulse and is not breathing Do Not Attempt Resuscitation   If patient has a pulse and/or is breathing: Medical Treatment Goals COMFORT MEASURES: Keep clean/warm/dry, use medication by any route; positioning, wound care and other measures to relieve pain/suffering; use oxygen, suction/manual treatment of airway obstruction for comfort; do not transfer unless for comfort needs.   Consent: Discussion documented in EHR or advanced directives reviewed      03/27/23 1622           Code Status History     Date Active Date Inactive Code Status Order ID Comments User Context   02/16/2023 1616 02/21/2023 1821 DNR [id number]  Lovenia Kim, DO ED   05/27/2022 1224 05/29/2022 1950 DNR [id number]  Pennie Banter, DO ED   04/20/2022 1628 04/29/2022 1938 DNR [id number]  Lucile Shutters, MD ED   04/20/2022 1550 04/20/2022 1628 Full Code [id number]  Lucile Shutters, MD ED   05/08/2021 0856 05/11/2021 2019 DNR [id number]  Tresa Moore, MD Inpatient   05/07/2021 1416 05/08/2021 0855 Full Code [id number]  Lucile Shutters, MD ED   11/29/2020 1614 12/01/2020 2004 DNR [id number]  Kathrynn Running, MD ED   02/22/2020 1753 02/24/2020 2032 DNR [id number]  Tresa Moore, MD ED   02/22/2020 1537 02/22/2020 1753 Full Code [id number]  Tresa Moore, MD ED   07/29/2019 2305 08/04/2019 1654 Full Code [id number]  Oralia Manis, MD ED       Prognosis:  Unable to  determine   Thank you for allowing the Palliative Medicine Team to assist in the care of this patient.  Morton Stall, NP  Please contact Palliative Medicine Team phone at [phone] for questions and  concerns.

## 2023-04-07 NOTE — Progress Notes (Signed)
Progress Note    04/07/2023 2:32 PM 5 Days Post-Op  Subjective: Richard Carter is an 82 year old male who is now status postop day #5 from a left carotid endarterectomy.  On exam this morning patient is resting comfortably in bed.  He expresses this morning that he feels better.  Denies any new neurological changes or feeling weak anywhere.  Acute events overnight.  Is all remained stable.   Vitals:   04/07/23 0811 04/07/23 1206  BP: (!) 164/70 (!) 150/82  Pulse: 62 87  Resp: 16 20  Temp: 97.6 F (36.4 C) (!) 97.4 F (36.3 C)  SpO2: 94% 95%   Physical Exam: Cardiac:   Atrial Fibrillation, without  Murmurs, rubs or gallops; with carotid bruits  Lungs:   normal non-labored breathing, without Rales, rhonchi, wheezing  Incisions: Left Neck incision without hematoma and healing as expected.  Extremities:  without ischemic changes, without Gangrene , without cellulitis; without open wounds;  Abdomen:  Positive Bowel Sounds, soft, NT/ND, no masses  Neurologic:   A&O X 3;  No focal weakness or paresthesias are detected; speech is slightly slurred with a history of right sided facial droop. Continues to have right sided weakness. Able to stand and pivot with two person assist only.    CBC    Component Value Date/Time   WBC 8.8 04/07/2023 0430   RBC 3.12 (L) 04/07/2023 0430   HGB 8.2 (L) 04/07/2023 0430   HGB 12.9 (L) 10/25/2014 1048   HCT 25.7 (L) 04/07/2023 0430   HCT 38.6 (L) 10/25/2014 1048   PLT 223 04/07/2023 0430   PLT 93 (L) 10/25/2014 1048   MCV 82.4 04/07/2023 0430   MCV 108 (H) 10/25/2014 1048   MCH 26.3 04/07/2023 0430   MCHC 31.9 04/07/2023 0430   RDW 18.9 (H) 04/07/2023 0430   RDW 19.6 (H) 10/25/2014 1048   LYMPHSABS 1.4 03/27/2023 1409   LYMPHSABS 0.8 (L) 10/25/2014 1048   MONOABS 0.8 03/27/2023 1409   MONOABS 0.9 10/25/2014 1048   EOSABS 0.4 03/27/2023 1409   EOSABS 0.0 10/25/2014 1048   BASOSABS 0.1 03/27/2023 1409   BASOSABS 0.0 10/25/2014 1048     BMET    Component Value Date/Time   NA 139 04/07/2023 0430   NA 133 (L) 10/25/2014 1048   K 4.2 04/07/2023 0430   K 3.3 (L) 10/25/2014 1048   CL 109 04/07/2023 0430   CL 92 (L) 10/25/2014 1048   CO2 24 04/07/2023 0430   CO2 30 10/25/2014 1048   GLUCOSE 95 04/07/2023 0430   GLUCOSE 125 (H) 10/25/2014 1048   BUN 22 04/07/2023 0430   BUN 18 10/25/2014 1048   CREATININE 0.83 04/07/2023 0430   CREATININE 1.02 10/25/2014 1048   CALCIUM 8.8 (L) 04/07/2023 0430   CALCIUM 8.9 10/25/2014 1048   GFRNONAA >60 04/07/2023 0430   GFRNONAA >60 10/25/2014 1048   GFRNONAA >60 06/09/2014 0431   GFRAA >60 02/24/2020 0401   GFRAA >60 10/25/2014 1048   GFRAA >60 06/09/2014 0431    INR    Component Value Date/Time   INR 1.5 (H) 03/28/2023 1650     Intake/Output Summary (Last 24 hours) at 04/07/2023 1432 Last data filed at 04/07/2023 0900 Gross per 24 hour  Intake 909.42 ml  Output 1650 ml  Net -740.58 ml     Assessment/Plan:  82 y.o. male is s/p symptomatic left carotid endarterectomy.  5 Days Post-Op   PLAN: Patient noted to have symptomatic the left carotid stenosis for which he  underwent a left carotid endarterectomy.  His neurologic exam remains at his baseline which includes prior right-sided weakness.  We are continuing with supportive care including PT OT and speech therapy as part of his rehab.  Patient is noted to have right-sided weakness requiring 2 person assist to stand and transfer.  Vascular surgery recommends that the patient be discharged on Eliquis as prior for his atrial fibrillation and continue aspirin and statin for his atherosclerosis.  Recommend patient going to rehab prior to going home.  This is due to his continued right-sided weakness.  Patient noted to live at home by himself even though there is a caretaker across the street.  DVT prophylaxis: Eliquis 5 mg twice daily, aspirin 81 mg daily   Marcie Bal [facility] 04/07/2023 2:32  PM

## 2023-04-07 NOTE — TOC Transition Note (Signed)
Transition of Care [facility]) - CM/SW Discharge Note   Patient Details  Name: Richard Carter MRN: [id number] Date of Birth: [birth year]  Transition of Care [facility]) CM/SW Contact:  Garret Reddish, RN Phone Number: 04/07/2023, 3:58 PM   Clinical Narrative:  I have spoke with Gavin Pound, Admissions Coordinator from [facility]. She reports that she will have a bed for the patient today.  Gavin Pound reports that patient's room number will be room 212 A and the number to call report is [phone].  I have sent Deborah with [facility] the Discharge Summary, Discharge orders, and SNF packet.    I have informed Renaldo Reel patient's POA that patient would be going to [facility] today via [employer].  I have arranged for [facility] to transport patient to [facility] today.    I have informed staff nurse of the above information.    I    Final next level of care: Skilled Nursing Facility Barriers to Discharge: No Barriers Identified   Patient Goals and CMS Choice CMS Medicare.gov Compare Post Acute Care list provided to:: Patient Represenative (must comment) Choice offered to / list presented to : [facility] POA / Guardian  Discharge Placement                Patient [facility] bed at: [facility] Patient to be transferred to facility by: [facility] Name of [facility] Patient and family notified of of transfer: 04/07/23  Discharge Plan and Services Additional resources added to the After Visit Summary for                              [facility] Agency: [facility] Date [facility] Agency Contacted: 03/29/23   Representative spoke with at [facility] Agency: [name]  Social Determinants of Health (SDOH) Interventions SDOH Screenings   Food Insecurity: No Food Insecurity (02/16/2023)  Housing: Low Risk  (02/16/2023)  Transportation Needs: No Transportation Needs (02/16/2023)  Utilities: Not At Risk (02/16/2023)  Tobacco Use:  Medium Risk (04/03/2023)     Readmission Risk Interventions    04/21/2022    1:55 PM  Readmission Risk Prevention Plan  Post Dischage Appt Complete  Medication Screening Complete  Transportation Screening Complete

## 2023-04-07 NOTE — Care Management Important Message (Signed)
Important Message  Patient Details  Name: Richard Carter MRN: [id number] Date of Birth: [birth year]   Medicare Important Message Given:  Yes  I reviewed the Important Message from Medicare with the patients's POA, Renaldo Reel [phone] by phone. He was in agreement with the discharge and I thanked him for his time.   Olegario Messier A Beniah Magnan 04/07/2023, 3:51 PM

## 2023-04-07 NOTE — Progress Notes (Signed)
Physical Therapy Treatment Patient Details Name: Richard Carter MRN: [id number] DOB: 1941/01/24 Today's Date: 04/07/2023   History of Present Illness Pt is an 82 y.o. male presenting to hospital 03/27/23 with sudden onset of difficulties with speech; R sided weakness also noted but resolved; R sided sensation changes noted as well as facial droop.  Imaging showing acute infarct in the postcentral gyrus on L.  Pt admitted with stroke, stenosis of L carotid artery, a-fib, and htn.  Recent hospitalization 3/24-3/29 for stroke.  PMH includes alcohol abuse, dementia, htn, seizures, stroke (with L sided hemiparesis), a-fib, AKI, pubic ramus and acetabular fx 2020, UTI, chronic L middle finger contracture.  Per notes pt also with h/o R UE weakness. Is now s/p carotid endarterectomy 04/02/2023.    PT Comments    Pt is making limited progress towards goals with slow recovery of R side deficits. Pt with improved grip strength and less swelling/pain noted. Pt able to stand x 2 reps with 1 assistance, however unable to SPT from bed<>chair without +2 assist. Secure chat sent to care team and white board updated. Pt reports he has WC, ramp, BSC, RW for home use. 93% on RA with exertion. Will continue to progress.   Recommendations for follow up therapy are one component of a multi-disciplinary discharge planning process, led by the attending physician.  Recommendations may be updated based on patient status, additional functional criteria and insurance authorization.  Follow Up Recommendations  Can patient physically be transported by private vehicle: No    Assistance Recommended at Discharge Frequent or constant Supervision/Assistance  Patient can return home with the following Assistance with cooking/housework;Direct supervision/assist for medications management;Assist for transportation;Help with stairs or ramp for entrance;Two people to help with walking and/or transfers;A lot of help with  bathing/dressing/bathroom;Assistance with feeding;Direct supervision/assist for financial management   Equipment Recommendations  Rolling walker (2 wheels);Wheelchair (measurements PT);Wheelchair cushion (measurements PT)    Recommendations for Other Services OT consult     Precautions / Restrictions Precautions Precautions: Fall Precaution Comments: Seizure precautions Restrictions Weight Bearing Restrictions: No     Mobility  Bed Mobility Overal bed mobility: Needs Assistance Bed Mobility: Supine to Sit, Sit to Supine     Supine to sit: Min guard     General bed mobility comments: able to exit bed to L side. Improved technique performed. ONce seated, able to sit at EOB with close supervision    Transfers Overall transfer level: Needs assistance Equipment used: Rolling walker (2 wheels) Transfers: Sit to/from Stand Sit to Stand: Mod assist Stand pivot transfers: Max assist, +2 physical assistance         General transfer comment: able to stand x 2 reps with mod assist x 1 with and without RW. Pt with R knee buckling during pregait, unsafe to take steps away from bed without 2nd assist. Pt then able to perform SPT from bed->chair with +2 max assist.    Ambulation/Gait               General Gait Details: not safe to ambulate this date   Stairs             Wheelchair Mobility    Modified Rankin (Stroke Patients Only)       Balance Overall balance assessment: Needs assistance Sitting-balance support: Feet supported Sitting balance-Leahy Scale: Fair     Standing balance support: Single extremity supported Standing balance-Leahy Scale: Poor  Cognition Arousal/Alertness: Awake/alert Behavior During Therapy: Flat affect Overall Cognitive Status: History of cognitive impairments - at baseline                                 General Comments: able to follow commands, fatigues quickly         Exercises Other Exercises Other Exercises: supine/seated ther-ex performed on B LE including SLRs, hip abd/add, LAQ, and elbow taps to B sides. Also performed R UE shoulder raises (AAROM). All ther-ex x 10 reps with supervision on R LE and indep with L LE    General Comments        Pertinent Vitals/Pain Pain Assessment Pain Assessment: No/denies pain    Home Living                          Prior Function            PT Goals (current goals can now be found in the care plan section) Acute Rehab PT Goals Patient Stated Goal: improved mobility PT Goal Formulation: With patient Time For Goal Achievement: 04/18/23 Potential to Achieve Goals: Fair Progress towards PT goals: Progressing toward goals    Frequency    Min 4X/week      PT Plan Current plan remains appropriate    Co-evaluation              AM-PAC PT "6 Clicks" Mobility   Outcome Measure  Help needed turning from your back to your side while in a flat bed without using bedrails?: A Little Help needed moving from lying on your back to sitting on the side of a flat bed without using bedrails?: A Little Help needed moving to and from a bed to a chair (including a wheelchair)?: A Lot Help needed standing up from a chair using your arms (e.g., wheelchair or bedside chair)?: A Lot Help needed to walk in hospital room?: Total Help needed climbing 3-5 steps with a railing? : Total 6 Click Score: 12    End of Session Equipment Utilized During Treatment: Gait belt Activity Tolerance: Patient limited by fatigue Patient left: in chair;with chair alarm set Nurse Communication: Mobility status PT Visit Diagnosis: Unsteadiness on feet (R26.81);Other abnormalities of gait and mobility (R26.89);Muscle weakness (generalized) (M62.81)     Time: 4098-1191 PT Time Calculation (min) (ACUTE ONLY): 36 min  Charges:  $Therapeutic Exercise: 8-22 mins $Therapeutic Activity: 8-22 mins                      Elizabeth Palau, PT, DPT, GCS [phone]    [name] 04/07/2023, 1:29 PM

## 2023-04-07 NOTE — TOC Progression Note (Addendum)
Transition of Care [facility]) - Progression Note    Patient Details  Name: Richard Carter MRN: [id number] Date of Birth: 02-Jul-1941  [facility] CM/SW Contact  Truddie Hidden, RN Phone Number: 04/07/2023, 1:57 PM  Clinical Narrative:    Patient approved for SNF at [facility].  #[id number] 04/07/2023-04/09/2023 Attempt to reach Gavin Pound in admissions at facility. Left message regarding admission.    Expected Discharge Plan: Home w Home Health Services Barriers to Discharge: Continued Medical Work up  Expected Discharge Plan and Services       Living arrangements for the past 2 months: Single Family Home                             HH Agency: [facility] Date [facility] Agency Contacted: 03/29/23   Representative spoke with at [facility] Agency: [name]   Social Determinants of Health (SDOH) Interventions SDOH Screenings   Food Insecurity: No Food Insecurity (02/16/2023)  Housing: Low Risk  (02/16/2023)  Transportation Needs: No Transportation Needs (02/16/2023)  Utilities: Not At Risk (02/16/2023)  Tobacco Use: Medium Risk (04/03/2023)    Readmission Risk Interventions    04/21/2022    1:55 PM  Readmission Risk Prevention Plan  Post Dischage Appt Complete  Medication Screening Complete  Transportation Screening Complete

## 2023-04-07 NOTE — Discharge Summary (Signed)
[facility] Discharge Summary   Patient: Richard Carter ION:[id number]  PCP: Marisue Ivan, MD  Date of admission: 03/27/2023   Date of discharge:  04/07/2023     Discharge Diagnoses:  Principal Problem:   Stroke [facility]) Active Problems:   Stenosis of left carotid artery   AF (paroxysmal atrial fibrillation) (HCC)   Seizures (HCC)   HTN (hypertension)   Dementia (HCC)   HLD (hyperlipidemia)   Dysphagia due to recent cerebrovascular accident   Goals of care, counseling/discussion   Malnutrition of moderate degree   Admitted From: Home Disposition:  SNF  Recommendations for Outpatient Follow-up:  Follow-up with PCP, patient should be seen by an MD in 1 to 2 days, continue to monitor BP and titrate medications accordingly. Follow with neurology in 1 to 2 weeks Follow-up with vascular surgery in 1 to 2 weeks Follow up LABS/TEST:     Diet recommendation: Cardiac diet  Activity: The patient is advised to gradually reintroduce usual activities, as tolerated  Discharge Condition: stable  Code Status: DNR   History of present illness: As per the H and P dictated on admission Hospital Course:  [name] is a 82 y.o. 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  Found to have recurrent stroke.  Neurology and vascular surgery involved.  Plan for left CEA on 5/7.  Currently on IV heparin.    Assessment and Plan: # Stenosis of left carotid artery: pt has high-grade left carotid arteries stenosis -consulted Dr. Wyn Quaker, vascular surgery, patient was already following as an outpatient.   S/p left carotid endarterectomy done on 04/02/2023. S/p Heparin IV infusion, d/c'd on 5/9 and resumed Eliquis 5 mg p.o. twice daily home dose. Started aspirin 81 mg p.o. daily.   Patient was cleared by vascular surgery to discharge and follow-up as an outpatient. # Bradycardia, discussed with cardiology, stated that it is common after carotid endarterectomy, no need of pacemaker.  Follow with cardiology as an outpatient if persistent bradycardia. # Acute ischemic stroke: MRI-brain showed acute infarct in the postcentral gyrus on the left.  Carotid Dopplers unchanged from prior stenosis. S/p permissive hypertension. S/p heparin IV infusion, resumed Eliquis.    Cholesterol medications restarted as diet has been upgraded. PT/OT evaluation done, recommended SNF placement.   Recently had an echocardiogram with preserved EF without any wall motion abnormality. Palliative care was consulted for goals of care discussion.  5/12 patient was complaining of pain in the dorsum of right hand, no signs of infection.  No erythema or tenderness.  # AKI most likely secondary to dehydration, Patient is on dysphagia 1 diet with nectar thick liquids. S/p IV fluid for gentle hydration 5/13 creatinine 0.83  improved, AKI resolved. # Dysphagia: Patient had multiple evaluation by SLP, recommended dysphagia 1 diet with nectar thick liquids.  Patient needs assistance with feeding, instructions were given to RN by speech therapist. # paroxysmal atrial fibrillation, patient developed bradycardia, so discontinued Lopressor, continue to monitor heart rate. S/p heparin IV infusion, resumed Eliquis 5 mg p.o. twice daily on 5/9.   # seizures: Seizure precaution. Continue home Keppra. # TN (hypertension): s/p permissive hypertension for 48 hrs, now goal is normotensive.  Increase amlodipine to 10 mg p.o. daily.  Resumed Cozaar on discharge. Discontinued metoprolol due to bradycardia # HLD: Continue his home atorvastatin LDL goal <70 #  Dementia [facility]): No behavior disturbance, continue fall precaution # Hypophosphatemia most likely refeeding syndrome, Phos repleted.  Resolved # Hypomagnesemia, mag repleted.   Started magnesium oxide 400 mg p.o. twice daily.  May repeat magnesium level after few weeks. # # Hyperkalemia, potassium 5.2 slightly elevated. Started Lokelma 10 g p.o. 3 times daily x 2 doses On 5/10 hyperkalemia resolved and on 5/12 mild hypokalemia, potassium repleted. # Metabolic acidosis, CO2 18 --24, improved after oral bicarbonate. # Anemia due to iron deficiency and B12 deficiency. Hb 7.0-7.2, remained low, 1 unit PRBC transfused on 5/9 5/10 Hb 8.1, posttransfusion. 5/13 Hb 8.2 today. # Iron deficiency anemia, transferrin saturation 6%, S/p ferric gluconate 250 mg IV daily for 4 doses, followed by oral supplement with vitamin C. Folic acid level 8.7, within normal range. # Vitamin B12 deficiency, B12 level 165, goal >400, started vitamin B12 1000 mcg IM injection daily during hospital stay, followed by oral supplement.  Follow-up PCP to repeat vitamin B12 level after 3 to 6 months.    Body mass index is 25.9 kg/m.  Nutrition Problem: Moderate Malnutrition Etiology: social / environmental circumstances (dementia, etoh abuse, CVA) Nutrition Interventions:   Patient was seen by physical therapy, who recommended Therapy, SNF placement, which was arranged. On the day of the discharge the patient's vitals were stable, and no other acute medical condition were reported by patient. the patient was felt safe to be discharge at SNF with Therapy.  Consultants: Neurology and vascular surgery, pulmonary critical care Procedures: Left carotid endarterectomy (CEA)  Discharge Exam: General: Appear in no distress, no Rash; Oral Mucosa Clear, moist. Cardiovascular: S1 and S2 Present, no Murmur, Respiratory: normal respiratory effort, Bilateral Air entry present and no Crackles, no wheezes Abdomen: Bowel Sound present, Soft and no tenderness, no hernia Extremities: no Pedal edema, no calf tenderness Neurology: Residual right-sided weakness due to CVA affect appropriate.  Filed Weights    04/05/23 0712 04/06/23 0713 04/07/23 0547  Weight: 70.4 kg 74.2 kg 75 kg   Vitals:   04/07/23 1206 04/07/23 1508  BP: (!) 150/82 (!) 145/64  Pulse: 87 (!) 46  Resp: 20 18  Temp: (!) 97.4 F (36.3 C) 98.4 F (36.9 C)  SpO2: 95% 96%    DISCHARGE MEDICATION: Allergies as of 04/07/2023   No Known Allergies      Medication List     STOP taking these medications    metoprolol succinate 25 MG 24 hr tablet Commonly known as: TOPROL-XL       TAKE these medications    acetaminophen 325 MG tablet Commonly known as: TYLENOL Take 1-2 tablets (325-650 mg total) by mouth every 6 (six) hours as needed for mild pain, moderate pain, fever or headache (or temp >/= 101 F).   amLODipine 10 MG tablet Commonly known as: NORVASC Take 1 tablet (10 mg total) by mouth daily. What changed:  medication strength how much to take   apixaban 5 MG Tabs tablet Commonly known as: ELIQUIS Take 1 tablet (5 mg total) by mouth 2 (two) times daily.   ascorbic acid 500 MG tablet Commonly known as: VITAMIN C Take 1 tablet (500 mg total) by mouth daily. Start taking on: Apr 08, 2023   aspirin EC 81 MG tablet Take 1 tablet (81 mg total) by mouth daily at 6 (six) AM. Swallow whole. Start taking on: Apr 08, 2023   atorvastatin 20 MG tablet Commonly known as: LIPITOR Take 20 mg by mouth every evening.   cyanocobalamin 1000 MCG tablet  Take 1 tablet (1,000 mcg total) by mouth daily. Start taking on: Apr 08, 2023   food thickener Gel Commonly known as: SIMPLYTHICK (NECTAR/LEVEL 2/MILDLY THICK) Take 1 packet by mouth as needed.   iron polysaccharides 150 MG capsule Commonly known as: NIFEREX Take 1 capsule (150 mg total) by mouth daily for 9 days. Start taking on: Apr 08, 2023   levETIRAcetam 500 MG tablet Commonly known as: KEPPRA Take 1 tablet (500 mg total) by mouth 2 (two) times daily.   losartan 50 MG tablet Commonly known as: COZAAR Take 50 mg by mouth daily.   Magnesium Oxide 400  MG Caps Take 1 capsule (400 mg total) by mouth 2 (two) times daily.   pantoprazole 40 MG tablet Commonly known as: Protonix Take 1 tablet (40 mg total) by mouth 2 (two) times daily. After sixty days the patient is to continue protonix at 40 mg daily. What changed:  when to take this additional instructions   traZODone 100 MG tablet Commonly known as: DESYREL Take 1 tablet (100 mg total) by mouth at bedtime.       No Known Allergies Discharge Instructions     Call MD for:   Complete by: As directed    Weakness or numbness, worsening of neurological symptoms.   Call MD for:  difficulty breathing, headache or visual disturbances   Complete by: As directed    Call MD for:  extreme fatigue   Complete by: As directed    Call MD for:  persistant dizziness or light-headedness   Complete by: As directed    Call MD for:  persistant nausea and vomiting   Complete by: As directed    Call MD for:  severe uncontrolled pain   Complete by: As directed    Call MD for:  temperature >100.4   Complete by: As directed    Diet - low sodium heart healthy   Complete by: As directed    Discharge instructions   Complete by: As directed    Follow-up with PCP, patient should be seen by an MD in 1 to 2 days, continue to monitor BP and titrate medications accordingly. Follow with neurology in 1 to 2 weeks Follow-up with vascular surgery in 1 to 2 weeks   Increase activity slowly   Complete by: As directed        The results of significant diagnostics from this hospitalization (including imaging, microbiology, ancillary and laboratory) are listed below for reference.    Significant Diagnostic Studies: DG Swallowing Func-Speech Pathology  Result Date: 04/01/2023 Table formatting from the original result was not included. Modified Barium Swallow Study Patient Details Name: EFREN DORST MRN: [id number] Date of Birth: Oct 14, 1941 Today's Date: 04/01/2023 HPI/PMH: HPI: This is a 82 y.o. male  CHARLTON HERZFELD is a 82 y.o. male with medical history significant of Dementia, ETOH abuse, stroke, high-grade left carotid artery stenosis, seizure, A-fib on Eliquis, hypertension, hyperlipidemia, anemia, former smoker, ETOH abuse in remission for more than 3 years, who presents with slurred speech. Patient was recently hospitalized from 3/24-3/29 due to stroke.  Patient was found to have high-grade left carotid artery stenosis.  Dr. Gilda Crease of [facility] is following up with the patient. Patient was seen and in the process of bring scheduled outpatient for an Open Carotid Endarterectomy in the coming weeks. Current MRI revealed Acute infarct in the postcentral gyrus on the left 2. Sequela of severe chronic microvascular ischemic change with advanced generalized volume and likely a chronic left  basal ganglia hemorrhage.   CXR on 03/31/2023: Underinflation.  Chronic changes.  No acute cardiopulmonary disease. Similar to CXR at admit. Clinical Impression: Patient appears to present with what appears to be moderate, chronic oropharyngeal dysphagia with contributing factors of cognitive deficits(Dementia at baseline; ETOH abuse history), generalized weakness, sensorimotor deficits, and structural age-related changes. Aspiration of thin liquids occurred. Pt is only wearing an Upper Denture plate; missing lower dentition. Oral phase is characterized by min lingual weakness but functional bolus control/holding, disorganized lingual transport w/ premature bolus loss of liquids into the pharynx, and lingual/lateral sulci/floor of mouth residue post-swallow. Pt requires a CUED f/u, DRY swallow to reduce and/or clear oral cavity and BOT of residue. Time required for completing the DRY swallow. Swallow initiation is delayed to the level of the posterior laryngeal surface of epiglottis w/ Nectar/Honey/Puree consistencies; pyriform sinuses with thin liquid trial. Pharyngeal phase is noted for mildly decreased base of tongue retraction, mildly  reduced hyolaryngeal excursion, and pharyngeal constriction. This resulted in min, diffuse pharyngeal residue post swallow. Epiglottic inversion is partial but more complete w/ heavier boluses. There is mild valleculae and intermittently pyriform sinus residue, greater with liquid consistencies. Pharyngeal stripping wave is mildly reduce w/ liquids but more complete w/ heavier boluses. No overt laryngeal penetration with Nectar/Honey/Puree consistencies; immediate Aspiration noted during the swallow w/ thin liquid trial. Subsequent DRY swallow was effective in reducing and/or clearing oropharyngeal residue. Amplitude/duration of cricopharyngeus opening is WFL. There is adequate/complete clearance through the UES, however, a min prominent CP segment was noted as well as min-appearing osteophyte at ~C5-6 of the cervical esophagus - no overt impact from either on the opening nor clearing of the cervical esophagus during bolus transfer noted during the study. A 13 mm barium tablet was NOT given. Consistencies tested were thin liquids x1 tsps, nectar x2 tsp, 5 cup sips, honey x1 tsp, pudding x2 tsp. Reviewed study findings with pt; he will need Supervision during meals for follow through w/ precautions/strategies d/t Baseline Cognitive decline. Recommend Pureed diet with well-moistened foods, Nectar liquids via CUP, Pills Crushed in Puree. Pt will need assistance/cuing for f/u, DRY swallow each bite/sip. Will need a f/u, DRY swallow to reduce residue of each bite/sip. Clear oral cavity prior to taking another bite/sip. Upright posture during and after meals, with oral care prior to meals to reduce oral bacterial load. Post study, Education provided to Team that strategies may reduce but cannot prevent aspiration risk, and that in order to perform compensatory maneuvers, pt will need consistent Supervision and cuing to implement effectively. Medical Team verbalized understanding of risks, strategies, precautions, and diet  recommendation. Recommend f/u w/ Palliative Care team for discussion of GOC w/ pt/Family moving forward d/t the chronic nature of pt's dysphagia. Pt does have a Baseline of GERD; any Esophageal phase Dysmotility can impact swallowing, even Retrograde activity and Phlegm can occur and increase risk for aspiration, coughing during meals. Recommend GI f/u if a concern. Factors that may increase risk of adverse event in presence of aspiration Rubye Oaks & Clearance Coots 2021): Factors that may increase risk of adverse event in presence of aspiration Rubye Oaks & Clearance Coots 2021): Poor general health and/or compromised immunity; Reduced cognitive function; Dependence for feeding and/or oral hygiene (CVA) Recommendations/Plan: Swallowing Evaluation Recommendations Swallowing Evaluation Recommendations Recommendations: PO diet PO Diet Recommendation: Dysphagia 1 (Pureed); Mildly thick liquids (Level 2, nectar thick) Liquid Administration via: Cup; No straw Medication Administration: Crushed with puree Supervision: Patient able to self-feed; Staff to assist with self-feeding; Full supervision/cueing  for swallowing strategies Swallowing strategies  : Minimize environmental distractions; Slow rate; Small bites/sips; Multiple dry swallows after each bite/sip; Follow solids with liquids Postural changes: Position pt fully upright for meals; Stay upright 30-60 min after meals; Out of bed for meals (REFLUX precs.) Oral care recommendations: Oral care BID (2x/day); Oral care before PO; Staff/trained caregiver to provide oral care (Denture care) Recommended consults: Consider dietitian consultation; Consider Palliative care; Consider GI consultation (if any Esophageal dysmotility is suspected d/t pt's c/o "Phlegm") Caregiver Recommendations: Avoid jello, ice cream, thin soups, popsicles; Remove water pitcher; Have oral suction available Treatment Plan Treatment Plan Treatment recommendations: Defer treatment plan to SLP at other venue (see  follow-up recommendations) (in the home) Follow-up recommendations: Follow physicians's recommendations for discharge plan and follow up therapies Functional status assessment: Patient has had a recent decline in their functional status and/or demonstrates limited ability to make significant improvements in function in a reasonable and predictable amount of time. Treatment frequency: Min 1x/week Treatment duration: 1 week Interventions: Aspiration precaution training; Patient/family education; Compensatory techniques; Diet toleration management by SLP Recommendations Recommendations for follow up therapy are one component of a multi-disciplinary discharge planning process, led by the attending physician.  Recommendations may be updated based on patient status, additional functional criteria and insurance authorization. Assessment: Orofacial Exam: Orofacial Exam Oral Cavity: Oral Hygiene: Xerostomia (min+) Oral Cavity - Dentition: Missing dentition; Dentures, top (lower) Orofacial Anatomy: WFL Oral Motor/Sensory Function: WFL (grossly) Anatomy: Anatomy: Suspected cervical osteophytes; Prominent cricopharyngeus (during the swallow) Boluses Administered: Boluses Administered Boluses Administered: Thin liquids (Level 0); Mildly thick liquids (Level 2, nectar thick); Moderately thick liquids (Level 3, honey thick); Puree  Oral Impairment Domain: Oral Impairment Domain Lip Closure: No labial escape Tongue control during bolus hold: Cohesive bolus between tongue to palatal seal Bolus preparation/mastication: Timely and efficient chewing and mashing (w/ liquids and purees given) Bolus transport/lingual motion: Brisk tongue motion Oral residue: Residue collection on oral structures Location of oral residue : Floor of mouth; Lateral sulci; Tongue Initiation of pharyngeal swallow : Posterior laryngeal surface of the epiglottis (for Nectar/honey liquids; Valleculae for Puree; pyriform sinuses for thin trial)  Pharyngeal  Impairment Domain: Pharyngeal Impairment Domain Soft palate elevation: No bolus between soft palate (SP)/pharyngeal wall (PW) Laryngeal elevation: Partial superior movement of thyroid cartilage/partial approximation of arytenoids to epiglottic petiole Anterior hyoid excursion: Partial anterior movement Epiglottic movement: Partial inversion Laryngeal vestibule closure: Incomplete, narrow column air/contrast in laryngeal vestibule (w/ thins) Pharyngeal stripping wave : Present - diminished (w/ liquids; present/complete w/ puree trials given) Pharyngeal contraction (A/P view only): N/A Pharyngoesophageal segment opening: Complete distension and complete duration, no obstruction of flow Tongue base retraction: Narrow column of contrast or air between tongue base and PPW (thins) Pharyngeal residue: Collection of residue within or on pharyngeal structures Location of pharyngeal residue: Tongue base; Valleculae; Pyriform sinuses; Aryepiglottic folds (f/u Dry swallow reduced and/or cleared)  Esophageal Impairment Domain: Esophageal Impairment Domain Esophageal clearance upright position: Complete clearance, esophageal coating (min prominent CP segment; min osteophyte at ~C5-6) Pill: Esophageal Impairment Domain Esophageal clearance upright position: Complete clearance, esophageal coating (min prominent CP segment; min osteophyte at ~C5-6) Penetration/Aspiration Scale Score: Penetration/Aspiration Scale Score 1.  Material does not enter airway: Mildly thick liquids (Level 2, nectar thick); Moderately thick liquids (Level 3, honey thick); Puree 7.  Material enters airway, passes BELOW cords and not ejected out despite cough attempt by patient: Thin liquids (Level 0) Compensatory Strategies: Compensatory Strategies Compensatory strategies: Yes Multiple swallows: Effective (Dry swallow  reduced and/or cleared oropharyngeal residue adequately)   General Information: Caregiver present: No  Diet Prior to this Study: Dysphagia 1  (pureed); Mildly thick liquids (Level 2, nectar thick)   Temperature : Normal (wbc WNL)   Respiratory Status: WFL   Supplemental O2: None (Room air)   History of Recent Intubation: No  Behavior/Cognition: Alert; Cooperative; Pleasant mood; Distractible; Requires cueing (baseline Dementia per chart) Self-Feeding Abilities: Able to self-feed; Needs set-up for self-feeding; Needs assist with self-feeding Baseline vocal quality/speech: Hypophonia/low volume (mild dysarthria - baseline) Volitional Cough: Able to elicit Volitional Swallow: Able to elicit Exam Limitations: Fatigue Goal Planning: Prognosis for improved oropharyngeal function: Guarded (-Fair) Barriers to Reach Goals: Cognitive deficits; Language deficits; Time post onset; Severity of deficits; Overall medical prognosis Barriers/Prognosis Comment: Baseline Dementia; ETOH abuse; CVA Patient/Family Stated Goal: none stated Consulted and agree with results and recommendations: Patient; Physician; Nurse; Dietitian (Palliative Care) Pain: Pain Assessment Pain Assessment: No/denies pain End of Session: Start Time:SLP Start Time (ACUTE ONLY): 0800 Stop Time: SLP Stop Time (ACUTE ONLY): 0900 Time Calculation:SLP Time Calculation (min) (ACUTE ONLY): 60 min Charges: SLP Evaluations $ SLP Speech Visit: 1 Visit SLP Evaluations $BSS Swallow: 1 Procedure $MBS Swallow: 1 Procedure $Swallowing Treatment: 1 Procedure SLP visit diagnosis: SLP Visit Diagnosis: Dysphagia, oropharyngeal phase (R13.12) (baseline Dementia; ETOH abuse; CVA) Past Medical History: Past Medical History: Diagnosis Date  Alcohol abuse   Dementia (HCC)   GERD (gastroesophageal reflux disease)   HLD (hyperlipidemia)   HTN (hypertension)   Seizures (HCC)   Stroke [facility])  Past Surgical History: Past Surgical History: Procedure Laterality Date  skin cyst removal   Jerilynn Som, MS, CCC-SLP Speech Language Pathologist Rehab Services; [facility] [place] [phone] (ascom) Watson,Katherine 04/01/2023, 2:47  PM  US RENAL  Result Date: 04/01/2023 CLINICAL DATA:  Acute kidney injury. EXAM: RENAL / URINARY TRACT ULTRASOUND COMPLETE COMPARISON:  None Available. FINDINGS: Right Kidney: Renal measurements: 8.9 x 5.3 x 4.6 cm = volume: 112 mL. Probable 9 mm simple cyst is noted laterally. Echogenicity within normal limits. No mass or hydronephrosis visualized. Left Kidney: Renal measurements: 9.8 x 5.3 x 4.2 cm = volume: 116 mL. 2.9 cm simple cyst is noted. Echogenicity within normal limits. No mass or hydronephrosis visualized. Bladder: Appears normal for degree of bladder distention. Other: None. IMPRESSION: Mild right renal atrophy is noted. No hydronephrosis or renal obstruction is noted. Electronically Signed   By: Lupita Raider M.D.   On: 04/01/2023 14:23   [place] EKG SITE RITE  Result Date: 03/31/2023 If Site Rite image not attached, placement could not be confirmed due to current cardiac rhythm.  DG Chest Port 1 View  Result Date: 03/31/2023 CLINICAL DATA:  Aspiration pneumonia EXAM: PORTABLE CHEST 1 VIEW COMPARISON:  X-ray 03/28/2023 and older FINDINGS: Multiple old bilateral rib fractures once again identified. No consolidation, pneumothorax or effusion. Underinflation. Normal cardiopericardial silhouette. Calcified aorta. No edema. Overlapping cardiac leads IMPRESSION: Underinflation.  Chronic changes.  No acute cardiopulmonary disease Electronically Signed   By: Karen Kays M.D.   On: 03/31/2023 16:32   DG Chest Port 1 View  Result Date: 03/28/2023 CLINICAL DATA:  Shortness of breath EXAM: PORTABLE CHEST 1 VIEW COMPARISON:  X-ray 02/16/2023 FINDINGS: Stable cardiopericardial silhouette. Calcified tortuous aorta. Multiple old bilateral rib fractures. No pneumothorax, effusion or edema. No consolidation. Overlapping cardiac leads. IMPRESSION: No significant interval change. Electronically Signed   By: Karen Kays M.D.   On: 03/28/2023 14:01   US Carotid Bilateral (at [facility] and  AP only)  Result Date:  03/27/2023 CLINICAL DATA:  Acute cerebral infarction. EXAM: BILATERAL CAROTID DUPLEX ULTRASOUND TECHNIQUE: Wallace Cullens scale imaging, color Doppler and duplex ultrasound were performed of bilateral carotid and vertebral arteries in the neck. COMPARISON:  05/08/2021 FINDINGS: Criteria: Quantification of carotid stenosis is based on velocity parameters that correlate the residual internal carotid diameter with NASCET-based stenosis levels, using the diameter of the distal internal carotid lumen as the denominator for stenosis measurement. The following velocity measurements were obtained: RIGHT ICA:  77/9 cm/sec CCA:  74/15 cm/sec SYSTOLIC ICA/CCA RATIO:  1.0 ECA:  60 cm/sec LEFT ICA:  57/16 cm/sec CCA:  47/9 cm/sec SYSTOLIC ICA/CCA RATIO:  1.2 ECA:  114 cm/sec RIGHT CAROTID ARTERY: Similar moderate calcified plaque at the level of the distal common carotid artery, right carotid bulb and right ICA origin. Estimated right ICA stenosis remains less than 50%. RIGHT VERTEBRAL ARTERY: Antegrade flow with normal waveform and velocity. LEFT CAROTID ARTERY: Similar moderately severe calcified plaque burden at the level of the left carotid bulb and to a lesser degree the proximal left ICA. Estimated left ICA stenosis remains less than 50%. LEFT VERTEBRAL ARTERY: Antegrade flow with normal waveform and velocity. IMPRESSION: Similar moderate to moderately severe calcified plaque burden at the level of both carotid bulbs and proximal internal carotid arteries. No significant carotid stenosis identified with estimated bilateral ICA stenoses of less than 50%. Electronically Signed   By: Irish Lack M.D.   On: 03/27/2023 17:33   MR BRAIN WO CONTRAST  Result Date: 03/27/2023 CLINICAL DATA:  Stroke suspected. EXAM: MRI HEAD WITHOUT CONTRAST TECHNIQUE: Multiplanar, multiecho pulse sequences of the brain and surrounding structures were obtained without intravenous contrast. COMPARISON:  MR Head 02/16/23 FINDINGS: Assessment is markedly  limited due to the degree of motion artifact. Brain: There is likely an acute infarct in the postcentral gyrus the left (series 5 image 80). Sequela of severe chronic microvascular ischemic change with advanced generalized volume loss. Susceptibility artifact in the basal ganglia on the left present sequela of a chronic left basal ganglia hemorrhage. Chronic pontine infarct. Chronic infarct in the posterior left frontal lobe and likely the right occipital lobe. Vascular: Normal flow voids. Skull and upper cervical spine: Normal marrow signal. Sinuses/Orbits: No middle ear or mastoid effusion. Mucosal thickening bilateral ethmoid sinuses. Orbits are unremarkable. Other: None. IMPRESSION: Assessment is markedly limited due to the degree of motion artifact. Within this limitation- 1. Acute infarct in the postcentral gyrus on the left 2. 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 Electronically Signed   By: Lorenza Cambridge M.D.   On: 03/27/2023 17:14   CT HEAD CODE STROKE WO CONTRAST  Result Date: 03/27/2023 CLINICAL DATA:  Code stroke.  Difficulty with speech. EXAM: CT HEAD WITHOUT CONTRAST TECHNIQUE: Contiguous axial images were obtained from the base of the skull through the vertex without intravenous contrast. RADIATION DOSE REDUCTION: This exam was performed according to the departmental dose-optimization program which includes automated exposure control, adjustment of the mA and/or kV according to patient size and/or use of iterative reconstruction technique. COMPARISON:  Head CT 02/18/2023. FINDINGS: Brain: No acute intracranial hemorrhage. Unchanged encephalomalacia in the left posterior frontal lobe, involving the facial motor and pre motor regions. Old infarct in the right parietal lobe. Unchanged moderate chronic small-vessel disease and generalized volume loss. No hydrocephalus or extra-axial collection. No  mass effect or midline shift. Vascular: No hyperdense vessel or unexpected calcification.  Skull: No calvarial fracture or suspicious bone lesion. Skull base is unremarkable. Sinuses/Orbits: Unremarkable. Other: None. ASPECTS [facility] Stroke Program Early CT Score) - Ganglionic level infarction (caudate, lentiform nuclei, internal capsule, insula, M1-M3 cortex): 7 - Supraganglionic infarction (M4-M6 cortex): 2 Total score (0-10 with 10 being normal): 9 IMPRESSION: 1. No acute intracranial hemorrhage or evidence of evolving large vessel territory infarct. ASPECT score is 9. 2. Unchanged encephalomalacia in the left posterior frontal lobe, involving the right facial motor and pre motor regions. Code stroke imaging results were communicated on 03/27/2023 at 2:04 pm to provider Dr. Otelia Limes via secure text paging. Electronically Signed   By: Orvan Falconer M.D.   On: 03/27/2023 14:06    Microbiology: No results found for this or any previous visit (from the past 240 hour(s)).   Labs: CBC: Recent Labs  Lab 04/03/23 1813 04/04/23 0344 04/05/23 0602 04/06/23 0610 04/07/23 0430  WBC 11.3* 12.2* 11.7* 9.9 8.8  HGB 8.0* 8.1* 7.5* 7.8* 8.2*  HCT 26.3* 25.6* 23.9* 25.0* 25.7*  MCV 85.1 83.9 83.3 83.1 82.4  PLT 165 180 175 199 223   Basic Metabolic Panel: Recent Labs  Lab 04/03/23 0359 04/03/23 0533 04/04/23 0344 04/05/23 0602 04/06/23 0610 04/07/23 0430  NA 139  --  142 141 139 139  K 5.4* 5.2* 4.5 3.6 3.4* 4.2  CL 115*  --  115* 115* 113* 109  CO2 18*  --  21* 22 22 24   GLUCOSE 109*  --  112* 97 100* 95  BUN 29*  --  37* 24* 23 22  CREATININE 1.17  --  1.25* 1.05 0.93 0.83  CALCIUM 8.9  --  8.9 8.9 8.9 8.8*  MG 1.7  --  1.8 1.6* 2.0 1.6*  PHOS 3.2  --  2.6 1.6* 1.8* 2.7   Liver Function Tests: No results for input(s): "AST", "ALT", "ALKPHOS", "BILITOT", "PROT", "ALBUMIN" in the last 168 hours. No results for input(s): "LIPASE", "AMYLASE" in the last 168 hours. No results for input(s):  "AMMONIA" in the last 168 hours. Cardiac Enzymes: No results for input(s): "CKTOTAL", "CKMB", "CKMBINDEX", "TROPONINI" in the last 168 hours. BNP (last 3 results) No results for input(s): "BNP" in the last 8760 hours. CBG: Recent Labs  Lab 04/02/23 1057 04/02/23 2321  GLUCAP 104* 106*    Time spent: 35 minutes  Signed:  Gillis Santa  [facility] 04/07/2023 3:20 PM

## 2023-04-07 NOTE — Progress Notes (Signed)
Pt to be transferred to [facility] via EMS at this time.  Report given to [facility], RN.  PICC line removed per Dr. Lucianne Muss, site benign, 41 cm documented upon removal.  Tele and male purewick removed, all belongings returned.  Pt discharged without incident

## 2023-04-08 DIAGNOSIS — I10 Essential (primary) hypertension: Secondary | ICD-10-CM | POA: Diagnosis not present

## 2023-04-08 DIAGNOSIS — D649 Anemia, unspecified: Secondary | ICD-10-CM | POA: Diagnosis not present

## 2023-04-08 DIAGNOSIS — I480 Paroxysmal atrial fibrillation: Secondary | ICD-10-CM | POA: Diagnosis not present

## 2023-04-08 DIAGNOSIS — R1310 Dysphagia, unspecified: Secondary | ICD-10-CM | POA: Diagnosis not present

## 2023-04-08 DIAGNOSIS — G40909 Epilepsy, unspecified, not intractable, without status epilepticus: Secondary | ICD-10-CM | POA: Diagnosis not present

## 2023-04-08 DIAGNOSIS — I639 Cerebral infarction, unspecified: Secondary | ICD-10-CM | POA: Diagnosis not present

## 2023-04-08 DIAGNOSIS — E785 Hyperlipidemia, unspecified: Secondary | ICD-10-CM | POA: Diagnosis not present

## 2023-04-09 DIAGNOSIS — R001 Bradycardia, unspecified: Secondary | ICD-10-CM | POA: Diagnosis not present

## 2023-04-09 DIAGNOSIS — D649 Anemia, unspecified: Secondary | ICD-10-CM | POA: Diagnosis not present

## 2023-04-09 DIAGNOSIS — I480 Paroxysmal atrial fibrillation: Secondary | ICD-10-CM | POA: Diagnosis not present

## 2023-04-09 DIAGNOSIS — I639 Cerebral infarction, unspecified: Secondary | ICD-10-CM | POA: Diagnosis not present

## 2023-04-09 DIAGNOSIS — I10 Essential (primary) hypertension: Secondary | ICD-10-CM | POA: Diagnosis not present

## 2023-04-09 DIAGNOSIS — G40909 Epilepsy, unspecified, not intractable, without status epilepticus: Secondary | ICD-10-CM | POA: Diagnosis not present

## 2023-04-09 DIAGNOSIS — I6529 Occlusion and stenosis of unspecified carotid artery: Secondary | ICD-10-CM | POA: Diagnosis not present

## 2023-04-09 DIAGNOSIS — R1310 Dysphagia, unspecified: Secondary | ICD-10-CM | POA: Diagnosis not present

## 2023-04-17 ENCOUNTER — Ambulatory Visit (INDEPENDENT_AMBULATORY_CARE_PROVIDER_SITE_OTHER): Payer: Medicare HMO | Admitting: Nurse Practitioner

## 2023-04-17 ENCOUNTER — Encounter (INDEPENDENT_AMBULATORY_CARE_PROVIDER_SITE_OTHER): Payer: Self-pay | Admitting: Nurse Practitioner

## 2023-04-17 VITALS — BP 118/68 | HR 48 | Resp 18

## 2023-04-17 DIAGNOSIS — I6522 Occlusion and stenosis of left carotid artery: Secondary | ICD-10-CM

## 2023-04-21 ENCOUNTER — Encounter (INDEPENDENT_AMBULATORY_CARE_PROVIDER_SITE_OTHER): Payer: Self-pay | Admitting: Nurse Practitioner

## 2023-04-21 NOTE — Progress Notes (Signed)
Subjective:    Patient ID: Richard Carter, male    DOB: 08-03-1941, 82 y.o.   MRN: [id number] Chief Complaint  Patient presents with   Follow-up         The patient is seen for follow up evaluation of carotid stenosis status post left carotid endarterectomy on 04/02/2023.  There were no post operative problems or complications related to the surgery.  The patient denies neck or incisional pain.  The wound is healing well with some peeling of the Dermabond but no sign or symptom of infection.  The patient denies interval amaurosis fugax.  The patient has had multiple recurrent right CVA.  The patient denies headache.  The patient is taking enteric-coated aspirin 81 mg daily.  There is no history of DVT, PE or superficial thrombophlebitis. No recent episodes of angina or shortness of breath documented.      Review of Systems  Skin:  Positive for wound.  Neurological:  Positive for weakness.  All other systems reviewed and are negative.      Objective:   Physical Exam Vitals reviewed.  HENT:     Head: Normocephalic.  Cardiovascular:     Rate and Rhythm: Normal rate.  Pulmonary:     Effort: Pulmonary effort is normal.  Skin:    General: Skin is warm and dry.  Neurological:     Mental Status: He is alert and oriented to person, place, and time.     Motor: Weakness present.  Psychiatric:        Mood and Affect: Mood normal.        Behavior: Behavior normal.        Thought Content: Thought content normal.        Judgment: Judgment normal.     BP 118/68 (BP Location: Left Arm)   Pulse (!) 48   Resp 18   Past Medical History:  Diagnosis Date   Alcohol abuse    Dementia (HCC)    GERD (gastroesophageal reflux disease)    HLD (hyperlipidemia)    HTN (hypertension)    Seizures (HCC)    Stroke [facility])     Social History   Socioeconomic History   Marital status: Widowed    Spouse name: Not on file   Number of children: Not on file   Years of education: Not  on file   Highest education level: Not on file  Occupational History   Not on file  Tobacco Use   Smoking status: Former    Packs/day: 0.50    Years: 68.00    Additional pack years: 0.00    Total pack years: 34.00    Types: Cigarettes    Quit date: 2023    Years since quitting: 1.4   Smokeless tobacco: Never  Vaping Use   Vaping Use: Never used  Substance and Sexual Activity   Alcohol use: Not Currently    Comment: Drinks about 4oz of liquor daily. Last drink was yesterday 04/19/22.   Drug use: Never   Sexual activity: Not Currently  Other Topics Concern   Not on file  Social History Narrative   Not on file   Social Determinants of Health   Financial Resource Strain: Not on file  Food Insecurity: No Food Insecurity (02/16/2023)   Hunger Vital Sign    Worried About Running Out of Food in the Last Year: Never true    Ran Out of Food in the Last Year: Never true  Transportation Needs: No Transportation Needs (02/16/2023)  PRAPARE - Administrator, Civil Service (Medical): No    Lack of Transportation (Non-Medical): No  Physical Activity: Not on file  Stress: Not on file  Social Connections: Not on file  Intimate Partner Violence: Not At Risk (02/16/2023)   Humiliation, Afraid, Rape, and Kick questionnaire    Fear of Current or Ex-Partner: No    Emotionally Abused: No    Physically Abused: No    Sexually Abused: No    Past Surgical History:  Procedure Laterality Date   ENDARTERECTOMY Left 04/02/2023   Procedure: ENDARTERECTOMY CAROTID;  Surgeon: Renford Dills, MD;  Location: [facility] ORS;  Service: Vascular;  Laterality: Left;   skin cyst removal      Family History  Problem Relation Age of Onset   Stroke Mother    Cancer Brother     No Known Allergies     Latest Ref Rng & Units 04/07/2023    4:30 AM 04/06/2023    6:10 AM 04/05/2023    6:02 AM  CBC  WBC 4.0 - 10.5 K/uL 8.8  9.9  11.7   Hemoglobin 13.0 - 17.0 g/dL 8.2  7.8  7.5   Hematocrit 39.0 -  52.0 % 25.7  25.0  23.9   Platelets 150 - 400 K/uL 223  199  175       CMP     Component Value Date/Time   NA 139 04/07/2023 0430   NA 133 (L) 10/25/2014 1048   K 4.2 04/07/2023 0430   K 3.3 (L) 10/25/2014 1048   CL 109 04/07/2023 0430   CL 92 (L) 10/25/2014 1048   CO2 24 04/07/2023 0430   CO2 30 10/25/2014 1048   GLUCOSE 95 04/07/2023 0430   GLUCOSE 125 (H) 10/25/2014 1048   BUN 22 04/07/2023 0430   BUN 18 10/25/2014 1048   CREATININE 0.83 04/07/2023 0430   CREATININE 1.02 10/25/2014 1048   CALCIUM 8.8 (L) 04/07/2023 0430   CALCIUM 8.9 10/25/2014 1048   PROT 6.5 03/30/2023 0414   PROT 7.7 10/25/2014 1048   ALBUMIN 3.6 03/30/2023 0414   ALBUMIN 3.7 10/25/2014 1048   AST 18 03/30/2023 0414   AST 167 (H) 10/25/2014 1048   ALT 15 03/30/2023 0414   ALT 67 (H) 10/25/2014 1048   ALKPHOS 60 03/30/2023 0414   ALKPHOS 92 10/25/2014 1048   BILITOT 0.6 03/30/2023 0414   BILITOT 1.5 (H) 10/25/2014 1048   GFRNONAA >60 04/07/2023 0430   GFRNONAA >60 10/25/2014 1048   GFRNONAA >60 06/09/2014 0431   GFRAA >60 02/24/2020 0401   GFRAA >60 10/25/2014 1048   GFRAA >60 06/09/2014 0431     No results found.     Assessment & Plan:   1. Stenosis of left carotid artery Recommend:  The patient is s/p successful left CEA    Continue antiplatelet therapy as prescribed Continue management of CAD, HTN and Hyperlipidemia Healthy heart diet,  encouraged exercise at least 4 times per week  The patient's NIHSS score is as follows: 6 Mild: 1 - 5 Mild to Moderately Severe: 5 - 14 Severe: 15 - 24 Very Severe: >25  Patient will return in 3 to 4 weeks with carotid duplex   Current Outpatient Medications on File Prior to Visit  Medication Sig Dispense Refill   amLODipine (NORVASC) 10 MG tablet Take 1 tablet (10 mg total) by mouth daily.     apixaban (ELIQUIS) 5 MG TABS tablet Take 1 tablet (5 mg total) by mouth 2 (two)  times daily. 60 tablet 0   ascorbic acid (VITAMIN C) 500 MG  tablet Take 1 tablet (500 mg total) by mouth daily. 90 tablet 0   aspirin EC 81 MG tablet Take 1 tablet (81 mg total) by mouth daily at 6 (six) AM. Swallow whole. 30 tablet 12   atorvastatin (LIPITOR) 20 MG tablet Take 20 mg by mouth every evening.     cyanocobalamin 1000 MCG tablet Take 1 tablet (1,000 mcg total) by mouth daily. 90 tablet 0   esomeprazole (NEXIUM) 40 MG capsule Take by mouth.     food thickener (SIMPLYTHICK, NECTAR/LEVEL 2/MILDLY THICK,) GEL Take 1 packet by mouth as needed. 100 packet 0   iron polysaccharides (NIFEREX) 150 MG capsule Take 1 capsule (150 mg total) by mouth daily for 9 days. 9 capsule 0   levETIRAcetam (KEPPRA) 500 MG tablet Take 1 tablet (500 mg total) by mouth 2 (two) times daily. 60 tablet 3   losartan (COZAAR) 50 MG tablet Take 50 mg by mouth daily.     Magnesium Oxide 400 MG CAPS Take 1 capsule (400 mg total) by mouth 2 (two) times daily.  0   pantoprazole (PROTONIX) 40 MG tablet Take 1 tablet (40 mg total) by mouth 2 (two) times daily. After sixty days the patient is to continue protonix at 40 mg daily. (Patient taking differently: Take 40 mg by mouth daily.) 120 tablet 0   traZODone (DESYREL) 100 MG tablet Take 1 tablet (100 mg total) by mouth at bedtime.     No current facility-administered medications on file prior to visit.    There are no Patient Instructions on file for this visit. No follow-ups on file.   Georgiana Spinner, NP

## 2023-04-22 DIAGNOSIS — R5381 Other malaise: Secondary | ICD-10-CM | POA: Diagnosis not present

## 2023-04-22 DIAGNOSIS — I6529 Occlusion and stenosis of unspecified carotid artery: Secondary | ICD-10-CM | POA: Diagnosis not present

## 2023-04-24 DIAGNOSIS — I639 Cerebral infarction, unspecified: Secondary | ICD-10-CM | POA: Diagnosis not present

## 2023-04-24 DIAGNOSIS — D649 Anemia, unspecified: Secondary | ICD-10-CM | POA: Diagnosis not present

## 2023-04-24 DIAGNOSIS — I10 Essential (primary) hypertension: Secondary | ICD-10-CM | POA: Diagnosis not present

## 2023-04-24 DIAGNOSIS — E785 Hyperlipidemia, unspecified: Secondary | ICD-10-CM | POA: Diagnosis not present

## 2023-04-24 DIAGNOSIS — I480 Paroxysmal atrial fibrillation: Secondary | ICD-10-CM | POA: Diagnosis not present

## 2023-04-24 DIAGNOSIS — G40909 Epilepsy, unspecified, not intractable, without status epilepticus: Secondary | ICD-10-CM | POA: Diagnosis not present

## 2023-04-24 DIAGNOSIS — G4700 Insomnia, unspecified: Secondary | ICD-10-CM | POA: Diagnosis not present

## 2023-04-28 DIAGNOSIS — G40909 Epilepsy, unspecified, not intractable, without status epilepticus: Secondary | ICD-10-CM | POA: Diagnosis not present

## 2023-04-28 DIAGNOSIS — I6522 Occlusion and stenosis of left carotid artery: Secondary | ICD-10-CM | POA: Diagnosis not present

## 2023-04-28 DIAGNOSIS — Z9889 Other specified postprocedural states: Secondary | ICD-10-CM | POA: Diagnosis not present

## 2023-04-28 DIAGNOSIS — F0150 Vascular dementia without behavioral disturbance: Secondary | ICD-10-CM | POA: Diagnosis not present

## 2023-04-28 DIAGNOSIS — I69351 Hemiplegia and hemiparesis following cerebral infarction affecting right dominant side: Secondary | ICD-10-CM | POA: Diagnosis not present

## 2023-04-28 DIAGNOSIS — R569 Unspecified convulsions: Secondary | ICD-10-CM | POA: Diagnosis not present

## 2023-04-28 DIAGNOSIS — F0280 Dementia in other diseases classified elsewhere without behavioral disturbance: Secondary | ICD-10-CM | POA: Diagnosis not present

## 2023-04-28 DIAGNOSIS — K219 Gastro-esophageal reflux disease without esophagitis: Secondary | ICD-10-CM | POA: Diagnosis not present

## 2023-04-28 DIAGNOSIS — Z8673 Personal history of transient ischemic attack (TIA), and cerebral infarction without residual deficits: Secondary | ICD-10-CM | POA: Diagnosis not present

## 2023-04-28 DIAGNOSIS — I10 Essential (primary) hypertension: Secondary | ICD-10-CM | POA: Diagnosis not present

## 2023-04-28 DIAGNOSIS — I480 Paroxysmal atrial fibrillation: Secondary | ICD-10-CM | POA: Diagnosis not present

## 2023-04-28 DIAGNOSIS — G309 Alzheimer's disease, unspecified: Secondary | ICD-10-CM | POA: Diagnosis not present

## 2023-04-28 DIAGNOSIS — I69354 Hemiplegia and hemiparesis following cerebral infarction affecting left non-dominant side: Secondary | ICD-10-CM | POA: Diagnosis not present

## 2023-05-01 ENCOUNTER — Other Ambulatory Visit (INDEPENDENT_AMBULATORY_CARE_PROVIDER_SITE_OTHER): Payer: Self-pay | Admitting: Nurse Practitioner

## 2023-05-01 DIAGNOSIS — I480 Paroxysmal atrial fibrillation: Secondary | ICD-10-CM | POA: Diagnosis not present

## 2023-05-01 DIAGNOSIS — G309 Alzheimer's disease, unspecified: Secondary | ICD-10-CM | POA: Diagnosis not present

## 2023-05-01 DIAGNOSIS — I10 Essential (primary) hypertension: Secondary | ICD-10-CM | POA: Diagnosis not present

## 2023-05-01 DIAGNOSIS — K219 Gastro-esophageal reflux disease without esophagitis: Secondary | ICD-10-CM | POA: Diagnosis not present

## 2023-05-01 DIAGNOSIS — I69351 Hemiplegia and hemiparesis following cerebral infarction affecting right dominant side: Secondary | ICD-10-CM | POA: Diagnosis not present

## 2023-05-01 DIAGNOSIS — I6523 Occlusion and stenosis of bilateral carotid arteries: Secondary | ICD-10-CM

## 2023-05-01 DIAGNOSIS — I69354 Hemiplegia and hemiparesis following cerebral infarction affecting left non-dominant side: Secondary | ICD-10-CM | POA: Diagnosis not present

## 2023-05-01 DIAGNOSIS — F0280 Dementia in other diseases classified elsewhere without behavioral disturbance: Secondary | ICD-10-CM | POA: Diagnosis not present

## 2023-05-01 DIAGNOSIS — G40909 Epilepsy, unspecified, not intractable, without status epilepticus: Secondary | ICD-10-CM | POA: Diagnosis not present

## 2023-05-01 DIAGNOSIS — F0150 Vascular dementia without behavioral disturbance: Secondary | ICD-10-CM | POA: Diagnosis not present

## 2023-05-05 DIAGNOSIS — Z8673 Personal history of transient ischemic attack (TIA), and cerebral infarction without residual deficits: Secondary | ICD-10-CM | POA: Diagnosis not present

## 2023-05-05 DIAGNOSIS — K219 Gastro-esophageal reflux disease without esophagitis: Secondary | ICD-10-CM | POA: Diagnosis not present

## 2023-05-05 DIAGNOSIS — F0280 Dementia in other diseases classified elsewhere without behavioral disturbance: Secondary | ICD-10-CM | POA: Diagnosis not present

## 2023-05-05 DIAGNOSIS — I952 Hypotension due to drugs: Secondary | ICD-10-CM | POA: Diagnosis not present

## 2023-05-05 DIAGNOSIS — E7800 Pure hypercholesterolemia, unspecified: Secondary | ICD-10-CM | POA: Diagnosis not present

## 2023-05-05 DIAGNOSIS — R350 Frequency of micturition: Secondary | ICD-10-CM | POA: Diagnosis not present

## 2023-05-05 DIAGNOSIS — I69354 Hemiplegia and hemiparesis following cerebral infarction affecting left non-dominant side: Secondary | ICD-10-CM | POA: Diagnosis not present

## 2023-05-05 DIAGNOSIS — F5104 Psychophysiologic insomnia: Secondary | ICD-10-CM | POA: Diagnosis not present

## 2023-05-05 DIAGNOSIS — G40909 Epilepsy, unspecified, not intractable, without status epilepticus: Secondary | ICD-10-CM | POA: Diagnosis not present

## 2023-05-05 DIAGNOSIS — G309 Alzheimer's disease, unspecified: Secondary | ICD-10-CM | POA: Diagnosis not present

## 2023-05-05 DIAGNOSIS — I10 Essential (primary) hypertension: Secondary | ICD-10-CM | POA: Diagnosis not present

## 2023-05-05 DIAGNOSIS — D638 Anemia in other chronic diseases classified elsewhere: Secondary | ICD-10-CM | POA: Diagnosis not present

## 2023-05-05 DIAGNOSIS — I69351 Hemiplegia and hemiparesis following cerebral infarction affecting right dominant side: Secondary | ICD-10-CM | POA: Diagnosis not present

## 2023-05-05 DIAGNOSIS — I480 Paroxysmal atrial fibrillation: Secondary | ICD-10-CM | POA: Diagnosis not present

## 2023-05-06 DIAGNOSIS — I69351 Hemiplegia and hemiparesis following cerebral infarction affecting right dominant side: Secondary | ICD-10-CM | POA: Diagnosis not present

## 2023-05-06 DIAGNOSIS — G40909 Epilepsy, unspecified, not intractable, without status epilepticus: Secondary | ICD-10-CM | POA: Diagnosis not present

## 2023-05-06 DIAGNOSIS — I10 Essential (primary) hypertension: Secondary | ICD-10-CM | POA: Diagnosis not present

## 2023-05-06 DIAGNOSIS — G309 Alzheimer's disease, unspecified: Secondary | ICD-10-CM | POA: Diagnosis not present

## 2023-05-06 DIAGNOSIS — F0280 Dementia in other diseases classified elsewhere without behavioral disturbance: Secondary | ICD-10-CM | POA: Diagnosis not present

## 2023-05-06 DIAGNOSIS — I69354 Hemiplegia and hemiparesis following cerebral infarction affecting left non-dominant side: Secondary | ICD-10-CM | POA: Diagnosis not present

## 2023-05-06 DIAGNOSIS — K219 Gastro-esophageal reflux disease without esophagitis: Secondary | ICD-10-CM | POA: Diagnosis not present

## 2023-05-06 DIAGNOSIS — F0150 Vascular dementia without behavioral disturbance: Secondary | ICD-10-CM | POA: Diagnosis not present

## 2023-05-06 DIAGNOSIS — I480 Paroxysmal atrial fibrillation: Secondary | ICD-10-CM | POA: Diagnosis not present

## 2023-05-07 DIAGNOSIS — K219 Gastro-esophageal reflux disease without esophagitis: Secondary | ICD-10-CM | POA: Diagnosis not present

## 2023-05-07 DIAGNOSIS — G40909 Epilepsy, unspecified, not intractable, without status epilepticus: Secondary | ICD-10-CM | POA: Diagnosis not present

## 2023-05-07 DIAGNOSIS — I69354 Hemiplegia and hemiparesis following cerebral infarction affecting left non-dominant side: Secondary | ICD-10-CM | POA: Diagnosis not present

## 2023-05-07 DIAGNOSIS — G309 Alzheimer's disease, unspecified: Secondary | ICD-10-CM | POA: Diagnosis not present

## 2023-05-07 DIAGNOSIS — I480 Paroxysmal atrial fibrillation: Secondary | ICD-10-CM | POA: Diagnosis not present

## 2023-05-07 DIAGNOSIS — F0150 Vascular dementia without behavioral disturbance: Secondary | ICD-10-CM | POA: Diagnosis not present

## 2023-05-07 DIAGNOSIS — I69351 Hemiplegia and hemiparesis following cerebral infarction affecting right dominant side: Secondary | ICD-10-CM | POA: Diagnosis not present

## 2023-05-07 DIAGNOSIS — I10 Essential (primary) hypertension: Secondary | ICD-10-CM | POA: Diagnosis not present

## 2023-05-07 DIAGNOSIS — F0280 Dementia in other diseases classified elsewhere without behavioral disturbance: Secondary | ICD-10-CM | POA: Diagnosis not present

## 2023-05-08 DIAGNOSIS — Z8673 Personal history of transient ischemic attack (TIA), and cerebral infarction without residual deficits: Secondary | ICD-10-CM | POA: Diagnosis not present

## 2023-05-08 DIAGNOSIS — R350 Frequency of micturition: Secondary | ICD-10-CM | POA: Diagnosis not present

## 2023-05-08 DIAGNOSIS — D638 Anemia in other chronic diseases classified elsewhere: Secondary | ICD-10-CM | POA: Diagnosis not present

## 2023-05-08 DIAGNOSIS — E7800 Pure hypercholesterolemia, unspecified: Secondary | ICD-10-CM | POA: Diagnosis not present

## 2023-05-08 DIAGNOSIS — K219 Gastro-esophageal reflux disease without esophagitis: Secondary | ICD-10-CM | POA: Diagnosis not present

## 2023-05-08 DIAGNOSIS — I952 Hypotension due to drugs: Secondary | ICD-10-CM | POA: Diagnosis not present

## 2023-05-08 DIAGNOSIS — G309 Alzheimer's disease, unspecified: Secondary | ICD-10-CM | POA: Diagnosis not present

## 2023-05-08 DIAGNOSIS — F5104 Psychophysiologic insomnia: Secondary | ICD-10-CM | POA: Diagnosis not present

## 2023-05-12 ENCOUNTER — Ambulatory Visit (INDEPENDENT_AMBULATORY_CARE_PROVIDER_SITE_OTHER): Payer: Medicare HMO | Admitting: Vascular Surgery

## 2023-05-12 ENCOUNTER — Encounter (INDEPENDENT_AMBULATORY_CARE_PROVIDER_SITE_OTHER): Payer: Self-pay | Admitting: Vascular Surgery

## 2023-05-12 ENCOUNTER — Ambulatory Visit (INDEPENDENT_AMBULATORY_CARE_PROVIDER_SITE_OTHER): Payer: Medicare HMO

## 2023-05-12 VITALS — BP 120/72 | HR 77 | Resp 18 | Ht 67.0 in | Wt 165.0 lb

## 2023-05-12 DIAGNOSIS — I6522 Occlusion and stenosis of left carotid artery: Secondary | ICD-10-CM

## 2023-05-12 DIAGNOSIS — I6523 Occlusion and stenosis of bilateral carotid arteries: Secondary | ICD-10-CM

## 2023-05-12 NOTE — Progress Notes (Signed)
Patient ID: Richard Carter, male   DOB: [birth year], 82 y.o.   MRN: [id number]  Chief Complaint  Patient presents with   Follow-up    Follow up 3-4 week carotid    HPI Richard Carter is a 82 y.o. male.    The patient is s/p  Left carotid endarterectomy with Primary Closure on 04/02/2023.  Patient feels he is improved since his stenting procedure.  His family feels he has done well.  Duplex ultrasound of the carotid artery is obtained today demonstrates 1 to 39% stenosis bilaterally  Past Medical History:  Diagnosis Date   Alcohol abuse    Dementia (HCC)    GERD (gastroesophageal reflux disease)    HLD (hyperlipidemia)    HTN (hypertension)    Seizures (HCC)    Stroke [facility])     Past Surgical History:  Procedure Laterality Date   ENDARTERECTOMY Left 04/02/2023   Procedure: ENDARTERECTOMY CAROTID;  Surgeon: Renford Dills, MD;  Location: [facility] ORS;  Service: Vascular;  Laterality: Left;   skin cyst removal        No Known Allergies  Current Outpatient Medications  Medication Sig Dispense Refill   amLODipine (NORVASC) 10 MG tablet Take 1 tablet (10 mg total) by mouth daily.     apixaban (ELIQUIS) 5 MG TABS tablet Take 1 tablet (5 mg total) by mouth 2 (two) times daily. 60 tablet 0   ascorbic acid (VITAMIN C) 500 MG tablet Take 1 tablet (500 mg total) by mouth daily. 90 tablet 0   aspirin EC 81 MG tablet Take 1 tablet (81 mg total) by mouth daily at 6 (six) AM. Swallow whole. 30 tablet 12   atorvastatin (LIPITOR) 20 MG tablet Take 20 mg by mouth every evening.     cyanocobalamin 1000 MCG tablet Take 1 tablet (1,000 mcg total) by mouth daily. 90 tablet 0   esomeprazole (NEXIUM) 40 MG capsule Take by mouth.     food thickener (SIMPLYTHICK, NECTAR/LEVEL 2/MILDLY THICK,) GEL Take 1 packet by mouth as needed. 100 packet 0   levETIRAcetam (KEPPRA) 500 MG tablet Take 1 tablet (500 mg total) by mouth 2 (two) times daily. 60 tablet 3   losartan (COZAAR) 50 MG tablet Take 50  mg by mouth daily.     pantoprazole (PROTONIX) 40 MG tablet Take 1 tablet (40 mg total) by mouth 2 (two) times daily. After sixty days the patient is to continue protonix at 40 mg daily. (Patient taking differently: Take 40 mg by mouth daily.) 120 tablet 0   traZODone (DESYREL) 100 MG tablet Take 1 tablet (100 mg total) by mouth at bedtime.     iron polysaccharides (NIFEREX) 150 MG capsule Take 1 capsule (150 mg total) by mouth daily for 9 days. 9 capsule 0   No current facility-administered medications for this visit.        Physical Exam BP 120/72 (BP Location: Right Arm)   Pulse 77   Resp 18   Ht 5\' 7"  (1.702 m)   Wt 165 lb (74.8 kg)   BMI 25.84 kg/m  Gen:  WD/WN, NAD Skin: incision C/D/I     Assessment/Plan: 1. Stenosis of left carotid artery Recommend:  The patient is s/p successful left carotid stent  Duplex ultrasound  shows 1 to 39% stenosis bilaterally.  Continue antiplatelet therapy as prescribed Continue management of CAD, HTN and Hyperlipidemia Healthy heart diet,  encouraged exercise at least 4 times per week  The patient's NIHSS score is as  follows: 3 Mild: 1 - 5 Mild to Moderately Severe: 5 - 14 Severe: 15 - 24 Very Severe: >25  Follow up in 6 months with duplex ultrasound and physical exam based on the patient's carotid intervention. - VAS US CAROTID; Future      [name] 05/12/2023, 3:41 PM   This note was created with Dragon medical transcription system.  Any errors from dictation are unintentional.

## 2023-05-13 DIAGNOSIS — R350 Frequency of micturition: Secondary | ICD-10-CM | POA: Diagnosis not present

## 2023-05-13 DIAGNOSIS — I69351 Hemiplegia and hemiparesis following cerebral infarction affecting right dominant side: Secondary | ICD-10-CM | POA: Diagnosis not present

## 2023-05-13 DIAGNOSIS — R8290 Unspecified abnormal findings in urine: Secondary | ICD-10-CM | POA: Diagnosis not present

## 2023-05-13 DIAGNOSIS — F0150 Vascular dementia without behavioral disturbance: Secondary | ICD-10-CM | POA: Diagnosis not present

## 2023-05-13 DIAGNOSIS — F0280 Dementia in other diseases classified elsewhere without behavioral disturbance: Secondary | ICD-10-CM | POA: Diagnosis not present

## 2023-05-13 DIAGNOSIS — I10 Essential (primary) hypertension: Secondary | ICD-10-CM | POA: Diagnosis not present

## 2023-05-13 DIAGNOSIS — G309 Alzheimer's disease, unspecified: Secondary | ICD-10-CM | POA: Diagnosis not present

## 2023-05-13 DIAGNOSIS — K219 Gastro-esophageal reflux disease without esophagitis: Secondary | ICD-10-CM | POA: Diagnosis not present

## 2023-05-13 DIAGNOSIS — I480 Paroxysmal atrial fibrillation: Secondary | ICD-10-CM | POA: Diagnosis not present

## 2023-05-13 DIAGNOSIS — G40909 Epilepsy, unspecified, not intractable, without status epilepticus: Secondary | ICD-10-CM | POA: Diagnosis not present

## 2023-05-13 DIAGNOSIS — I69354 Hemiplegia and hemiparesis following cerebral infarction affecting left non-dominant side: Secondary | ICD-10-CM | POA: Diagnosis not present

## 2023-05-15 DIAGNOSIS — I480 Paroxysmal atrial fibrillation: Secondary | ICD-10-CM | POA: Diagnosis not present

## 2023-05-15 DIAGNOSIS — I69354 Hemiplegia and hemiparesis following cerebral infarction affecting left non-dominant side: Secondary | ICD-10-CM | POA: Diagnosis not present

## 2023-05-15 DIAGNOSIS — G40909 Epilepsy, unspecified, not intractable, without status epilepticus: Secondary | ICD-10-CM | POA: Diagnosis not present

## 2023-05-15 DIAGNOSIS — K219 Gastro-esophageal reflux disease without esophagitis: Secondary | ICD-10-CM | POA: Diagnosis not present

## 2023-05-15 DIAGNOSIS — I10 Essential (primary) hypertension: Secondary | ICD-10-CM | POA: Diagnosis not present

## 2023-05-15 DIAGNOSIS — I69351 Hemiplegia and hemiparesis following cerebral infarction affecting right dominant side: Secondary | ICD-10-CM | POA: Diagnosis not present

## 2023-05-15 DIAGNOSIS — G309 Alzheimer's disease, unspecified: Secondary | ICD-10-CM | POA: Diagnosis not present

## 2023-05-15 DIAGNOSIS — F0280 Dementia in other diseases classified elsewhere without behavioral disturbance: Secondary | ICD-10-CM | POA: Diagnosis not present

## 2023-05-15 DIAGNOSIS — F0150 Vascular dementia without behavioral disturbance: Secondary | ICD-10-CM | POA: Diagnosis not present

## 2023-05-17 ENCOUNTER — Encounter (INDEPENDENT_AMBULATORY_CARE_PROVIDER_SITE_OTHER): Payer: Self-pay | Admitting: Vascular Surgery

## 2023-05-20 DIAGNOSIS — F0150 Vascular dementia without behavioral disturbance: Secondary | ICD-10-CM | POA: Diagnosis not present

## 2023-05-20 DIAGNOSIS — I69351 Hemiplegia and hemiparesis following cerebral infarction affecting right dominant side: Secondary | ICD-10-CM | POA: Diagnosis not present

## 2023-05-20 DIAGNOSIS — I69354 Hemiplegia and hemiparesis following cerebral infarction affecting left non-dominant side: Secondary | ICD-10-CM | POA: Diagnosis not present

## 2023-05-20 DIAGNOSIS — K219 Gastro-esophageal reflux disease without esophagitis: Secondary | ICD-10-CM | POA: Diagnosis not present

## 2023-05-20 DIAGNOSIS — F0280 Dementia in other diseases classified elsewhere without behavioral disturbance: Secondary | ICD-10-CM | POA: Diagnosis not present

## 2023-05-20 DIAGNOSIS — I10 Essential (primary) hypertension: Secondary | ICD-10-CM | POA: Diagnosis not present

## 2023-05-20 DIAGNOSIS — G309 Alzheimer's disease, unspecified: Secondary | ICD-10-CM | POA: Diagnosis not present

## 2023-05-20 DIAGNOSIS — G40909 Epilepsy, unspecified, not intractable, without status epilepticus: Secondary | ICD-10-CM | POA: Diagnosis not present

## 2023-05-20 DIAGNOSIS — I480 Paroxysmal atrial fibrillation: Secondary | ICD-10-CM | POA: Diagnosis not present

## 2023-05-26 DIAGNOSIS — I69354 Hemiplegia and hemiparesis following cerebral infarction affecting left non-dominant side: Secondary | ICD-10-CM | POA: Diagnosis not present

## 2023-05-26 DIAGNOSIS — G309 Alzheimer's disease, unspecified: Secondary | ICD-10-CM | POA: Diagnosis not present

## 2023-05-26 DIAGNOSIS — I69351 Hemiplegia and hemiparesis following cerebral infarction affecting right dominant side: Secondary | ICD-10-CM | POA: Diagnosis not present

## 2023-05-26 DIAGNOSIS — K219 Gastro-esophageal reflux disease without esophagitis: Secondary | ICD-10-CM | POA: Diagnosis not present

## 2023-05-26 DIAGNOSIS — G40909 Epilepsy, unspecified, not intractable, without status epilepticus: Secondary | ICD-10-CM | POA: Diagnosis not present

## 2023-05-26 DIAGNOSIS — F0150 Vascular dementia without behavioral disturbance: Secondary | ICD-10-CM | POA: Diagnosis not present

## 2023-05-26 DIAGNOSIS — I10 Essential (primary) hypertension: Secondary | ICD-10-CM | POA: Diagnosis not present

## 2023-05-26 DIAGNOSIS — I480 Paroxysmal atrial fibrillation: Secondary | ICD-10-CM | POA: Diagnosis not present

## 2023-05-26 DIAGNOSIS — F0280 Dementia in other diseases classified elsewhere without behavioral disturbance: Secondary | ICD-10-CM | POA: Diagnosis not present

## 2023-05-28 DIAGNOSIS — I480 Paroxysmal atrial fibrillation: Secondary | ICD-10-CM | POA: Diagnosis not present

## 2023-05-28 DIAGNOSIS — F0280 Dementia in other diseases classified elsewhere without behavioral disturbance: Secondary | ICD-10-CM | POA: Diagnosis not present

## 2023-05-28 DIAGNOSIS — K219 Gastro-esophageal reflux disease without esophagitis: Secondary | ICD-10-CM | POA: Diagnosis not present

## 2023-05-28 DIAGNOSIS — I10 Essential (primary) hypertension: Secondary | ICD-10-CM | POA: Diagnosis not present

## 2023-05-28 DIAGNOSIS — I69351 Hemiplegia and hemiparesis following cerebral infarction affecting right dominant side: Secondary | ICD-10-CM | POA: Diagnosis not present

## 2023-05-28 DIAGNOSIS — G309 Alzheimer's disease, unspecified: Secondary | ICD-10-CM | POA: Diagnosis not present

## 2023-05-28 DIAGNOSIS — F0150 Vascular dementia without behavioral disturbance: Secondary | ICD-10-CM | POA: Diagnosis not present

## 2023-05-28 DIAGNOSIS — G40909 Epilepsy, unspecified, not intractable, without status epilepticus: Secondary | ICD-10-CM | POA: Diagnosis not present

## 2023-05-28 DIAGNOSIS — I69354 Hemiplegia and hemiparesis following cerebral infarction affecting left non-dominant side: Secondary | ICD-10-CM | POA: Diagnosis not present

## 2023-05-30 DIAGNOSIS — K219 Gastro-esophageal reflux disease without esophagitis: Secondary | ICD-10-CM | POA: Diagnosis not present

## 2023-05-30 DIAGNOSIS — F0150 Vascular dementia without behavioral disturbance: Secondary | ICD-10-CM | POA: Diagnosis not present

## 2023-05-30 DIAGNOSIS — I69351 Hemiplegia and hemiparesis following cerebral infarction affecting right dominant side: Secondary | ICD-10-CM | POA: Diagnosis not present

## 2023-05-30 DIAGNOSIS — F0280 Dementia in other diseases classified elsewhere without behavioral disturbance: Secondary | ICD-10-CM | POA: Diagnosis not present

## 2023-05-30 DIAGNOSIS — G40909 Epilepsy, unspecified, not intractable, without status epilepticus: Secondary | ICD-10-CM | POA: Diagnosis not present

## 2023-05-30 DIAGNOSIS — I10 Essential (primary) hypertension: Secondary | ICD-10-CM | POA: Diagnosis not present

## 2023-05-30 DIAGNOSIS — G309 Alzheimer's disease, unspecified: Secondary | ICD-10-CM | POA: Diagnosis not present

## 2023-05-30 DIAGNOSIS — I480 Paroxysmal atrial fibrillation: Secondary | ICD-10-CM | POA: Diagnosis not present

## 2023-05-30 DIAGNOSIS — I69354 Hemiplegia and hemiparesis following cerebral infarction affecting left non-dominant side: Secondary | ICD-10-CM | POA: Diagnosis not present

## 2023-05-31 ENCOUNTER — Observation Stay: Payer: Medicare HMO

## 2023-05-31 ENCOUNTER — Other Ambulatory Visit: Payer: Self-pay

## 2023-05-31 ENCOUNTER — Emergency Department: Payer: Medicare HMO

## 2023-05-31 ENCOUNTER — Inpatient Hospital Stay
Admission: EM | Admit: 2023-05-31 | Discharge: 2023-06-11 | DRG: 065 | Disposition: A | Discharge status: Skilled Nursing Facility | Payer: Medicare HMO | Attending: Internal Medicine | Admitting: Internal Medicine

## 2023-05-31 ENCOUNTER — Encounter: Payer: Self-pay | Admitting: Radiology

## 2023-05-31 DIAGNOSIS — G8311 Monoplegia of lower limb affecting right dominant side: Secondary | ICD-10-CM | POA: Diagnosis not present

## 2023-05-31 DIAGNOSIS — N179 Acute kidney failure, unspecified: Secondary | ICD-10-CM | POA: Diagnosis present

## 2023-05-31 DIAGNOSIS — G40909 Epilepsy, unspecified, not intractable, without status epilepticus: Secondary | ICD-10-CM | POA: Diagnosis not present

## 2023-05-31 DIAGNOSIS — Z8673 Personal history of transient ischemic attack (TIA), and cerebral infarction without residual deficits: Secondary | ICD-10-CM

## 2023-05-31 DIAGNOSIS — E782 Mixed hyperlipidemia: Secondary | ICD-10-CM | POA: Diagnosis present

## 2023-05-31 DIAGNOSIS — F1011 Alcohol abuse, in remission: Secondary | ICD-10-CM | POA: Diagnosis present

## 2023-05-31 DIAGNOSIS — K219 Gastro-esophageal reflux disease without esophagitis: Secondary | ICD-10-CM | POA: Diagnosis present

## 2023-05-31 DIAGNOSIS — F0150 Vascular dementia without behavioral disturbance: Secondary | ICD-10-CM | POA: Diagnosis not present

## 2023-05-31 DIAGNOSIS — Z87898 Personal history of other specified conditions: Secondary | ICD-10-CM | POA: Insufficient documentation

## 2023-05-31 DIAGNOSIS — I69391 Dysphagia following cerebral infarction: Secondary | ICD-10-CM

## 2023-05-31 DIAGNOSIS — I6389 Other cerebral infarction: Secondary | ICD-10-CM | POA: Diagnosis not present

## 2023-05-31 DIAGNOSIS — I480 Paroxysmal atrial fibrillation: Secondary | ICD-10-CM | POA: Diagnosis not present

## 2023-05-31 DIAGNOSIS — Z66 Do not resuscitate: Secondary | ICD-10-CM | POA: Diagnosis present

## 2023-05-31 DIAGNOSIS — I700 Atherosclerosis of aorta: Secondary | ICD-10-CM | POA: Diagnosis not present

## 2023-05-31 DIAGNOSIS — N390 Urinary tract infection, site not specified: Secondary | ICD-10-CM | POA: Diagnosis present

## 2023-05-31 DIAGNOSIS — R29810 Facial weakness: Secondary | ICD-10-CM | POA: Diagnosis present

## 2023-05-31 DIAGNOSIS — I10 Essential (primary) hypertension: Secondary | ICD-10-CM | POA: Diagnosis not present

## 2023-05-31 DIAGNOSIS — Z79899 Other long term (current) drug therapy: Secondary | ICD-10-CM | POA: Diagnosis not present

## 2023-05-31 DIAGNOSIS — R4781 Slurred speech: Secondary | ICD-10-CM | POA: Diagnosis present

## 2023-05-31 DIAGNOSIS — K8020 Calculus of gallbladder without cholecystitis without obstruction: Secondary | ICD-10-CM | POA: Diagnosis not present

## 2023-05-31 DIAGNOSIS — Z7401 Bed confinement status: Secondary | ICD-10-CM | POA: Diagnosis not present

## 2023-05-31 DIAGNOSIS — F0390 Unspecified dementia without behavioral disturbance: Secondary | ICD-10-CM | POA: Diagnosis not present

## 2023-05-31 DIAGNOSIS — Z7901 Long term (current) use of anticoagulants: Secondary | ICD-10-CM | POA: Diagnosis not present

## 2023-05-31 DIAGNOSIS — G309 Alzheimer's disease, unspecified: Secondary | ICD-10-CM | POA: Diagnosis present

## 2023-05-31 DIAGNOSIS — I7771 Dissection of carotid artery: Secondary | ICD-10-CM | POA: Diagnosis not present

## 2023-05-31 DIAGNOSIS — R001 Bradycardia, unspecified: Secondary | ICD-10-CM | POA: Diagnosis not present

## 2023-05-31 DIAGNOSIS — Z87891 Personal history of nicotine dependence: Secondary | ICD-10-CM | POA: Diagnosis not present

## 2023-05-31 DIAGNOSIS — N3001 Acute cystitis with hematuria: Secondary | ICD-10-CM

## 2023-05-31 DIAGNOSIS — I6501 Occlusion and stenosis of right vertebral artery: Secondary | ICD-10-CM | POA: Diagnosis not present

## 2023-05-31 DIAGNOSIS — R531 Weakness: Secondary | ICD-10-CM | POA: Diagnosis not present

## 2023-05-31 DIAGNOSIS — G459 Transient cerebral ischemic attack, unspecified: Secondary | ICD-10-CM | POA: Diagnosis not present

## 2023-05-31 DIAGNOSIS — I6523 Occlusion and stenosis of bilateral carotid arteries: Secondary | ICD-10-CM | POA: Diagnosis not present

## 2023-05-31 DIAGNOSIS — R2990 Unspecified symptoms and signs involving the nervous system: Secondary | ICD-10-CM

## 2023-05-31 DIAGNOSIS — N401 Enlarged prostate with lower urinary tract symptoms: Secondary | ICD-10-CM | POA: Diagnosis present

## 2023-05-31 DIAGNOSIS — F0280 Dementia in other diseases classified elsewhere without behavioral disturbance: Secondary | ICD-10-CM | POA: Diagnosis present

## 2023-05-31 DIAGNOSIS — I639 Cerebral infarction, unspecified: Secondary | ICD-10-CM | POA: Diagnosis present

## 2023-05-31 DIAGNOSIS — N320 Bladder-neck obstruction: Secondary | ICD-10-CM | POA: Diagnosis present

## 2023-05-31 DIAGNOSIS — R9089 Other abnormal findings on diagnostic imaging of central nervous system: Secondary | ICD-10-CM | POA: Diagnosis not present

## 2023-05-31 DIAGNOSIS — Z823 Family history of stroke: Secondary | ICD-10-CM

## 2023-05-31 DIAGNOSIS — N3000 Acute cystitis without hematuria: Secondary | ICD-10-CM | POA: Diagnosis not present

## 2023-05-31 DIAGNOSIS — R1310 Dysphagia, unspecified: Secondary | ICD-10-CM | POA: Diagnosis present

## 2023-05-31 DIAGNOSIS — R29708 NIHSS score 8: Secondary | ICD-10-CM | POA: Diagnosis present

## 2023-05-31 DIAGNOSIS — B9620 Unspecified Escherichia coli [E. coli] as the cause of diseases classified elsewhere: Secondary | ICD-10-CM | POA: Diagnosis present

## 2023-05-31 DIAGNOSIS — K449 Diaphragmatic hernia without obstruction or gangrene: Secondary | ICD-10-CM | POA: Diagnosis not present

## 2023-05-31 DIAGNOSIS — R569 Unspecified convulsions: Secondary | ICD-10-CM | POA: Diagnosis not present

## 2023-05-31 DIAGNOSIS — G464 Cerebellar stroke syndrome: Secondary | ICD-10-CM | POA: Diagnosis not present

## 2023-05-31 DIAGNOSIS — I610 Nontraumatic intracerebral hemorrhage in hemisphere, subcortical: Secondary | ICD-10-CM | POA: Diagnosis not present

## 2023-05-31 DIAGNOSIS — N200 Calculus of kidney: Secondary | ICD-10-CM | POA: Diagnosis present

## 2023-05-31 DIAGNOSIS — R471 Dysarthria and anarthria: Secondary | ICD-10-CM | POA: Diagnosis present

## 2023-05-31 DIAGNOSIS — W19XXXA Unspecified fall, initial encounter: Secondary | ICD-10-CM | POA: Diagnosis not present

## 2023-05-31 DIAGNOSIS — N281 Cyst of kidney, acquired: Secondary | ICD-10-CM | POA: Diagnosis not present

## 2023-05-31 LAB — COMPREHENSIVE METABOLIC PANEL
ALT: 12 U/L (ref 0–44)
AST: 19 U/L (ref 15–41)
Albumin: 3.6 g/dL (ref 3.5–5.0)
Alkaline Phosphatase: 68 U/L (ref 38–126)
Anion gap: 7 (ref 5–15)
BUN: 20 mg/dL (ref 8–23)
CO2: 21 mmol/L — ABNORMAL LOW (ref 22–32)
Calcium: 9.3 mg/dL (ref 8.9–10.3)
Chloride: 109 mmol/L (ref 98–111)
Creatinine, Ser: 1.32 mg/dL — ABNORMAL HIGH (ref 0.61–1.24)
GFR, Estimated: 54 mL/min — ABNORMAL LOW (ref >60)
Glucose, Bld: 98 mg/dL (ref 70–99)
Potassium: 3.6 mmol/L (ref 3.5–5.1)
Sodium: 137 mmol/L (ref 135–145)
Total Bilirubin: 0.5 mg/dL (ref 0.3–1.2)
Total Protein: 7.1 g/dL (ref 6.5–8.1)

## 2023-05-31 LAB — CBC WITH DIFFERENTIAL/PLATELET
Abs Immature Granulocytes: 0.05 10*3/uL (ref 0.00–0.07)
Basophils Absolute: 0 10*3/uL (ref 0.0–0.1)
Basophils Relative: 1 %
Eosinophils Absolute: 0.4 10*3/uL (ref 0.0–0.5)
Eosinophils Relative: 5 %
HCT: 34.5 % — ABNORMAL LOW (ref 39.0–52.0)
Hemoglobin: 11 g/dL — ABNORMAL LOW (ref 13.0–17.0)
Immature Granulocytes: 1 %
Lymphocytes Relative: 18 %
Lymphs Abs: 1.4 10*3/uL (ref 0.7–4.0)
MCH: 28.6 pg (ref 26.0–34.0)
MCHC: 31.9 g/dL (ref 30.0–36.0)
MCV: 89.6 fL (ref 80.0–100.0)
Monocytes Absolute: 0.7 10*3/uL (ref 0.1–1.0)
Monocytes Relative: 9 %
Neutro Abs: 5.3 10*3/uL (ref 1.7–7.7)
Neutrophils Relative %: 66 %
Platelets: 301 10*3/uL (ref 150–400)
RBC: 3.85 MIL/uL — ABNORMAL LOW (ref 4.22–5.81)
RDW: 20 % — ABNORMAL HIGH (ref 11.5–15.5)
WBC: 7.9 10*3/uL (ref 4.0–10.5)
nRBC: 0 % (ref 0.0–0.2)

## 2023-05-31 LAB — URINALYSIS, ROUTINE W REFLEX MICROSCOPIC
Bilirubin Urine: NEGATIVE
Glucose, UA: NEGATIVE mg/dL
Ketones, ur: NEGATIVE mg/dL
Nitrite: POSITIVE — AB
Protein, ur: 30 mg/dL — AB
RBC / HPF: >50 RBC/hpf (ref 0–5)
Specific Gravity, Urine: 1.006 (ref 1.005–1.030)
Squamous Epithelial / HPF: NONE SEEN /HPF (ref 0–5)
WBC, UA: >50 WBC/hpf (ref 0–5)
pH: 6 (ref 5.0–8.0)

## 2023-05-31 LAB — CBG MONITORING, ED: Glucose-Capillary: 92 mg/dL (ref 70–99)

## 2023-05-31 MED ORDER — FOOD THICKENER (SIMPLYTHICK): 1.0000 | ORAL | Status: DC | PRN

## 2023-05-31 MED ORDER — SENNOSIDES-DOCUSATE SODIUM 8.6-50 MG PO TABS
1.0000 | ORAL_TABLET | Freq: Every evening | ORAL | Status: DC | PRN
  Administered 2023-06-04 – 2023-06-06 (×3): 1 via ORAL
  Filled 2023-05-31 (×3): qty 1

## 2023-05-31 MED ORDER — ACETAMINOPHEN 160 MG/5ML PO SOLN: 650.0000 mg | ORAL | Status: DC | PRN

## 2023-05-31 MED ORDER — LEVETIRACETAM 500 MG PO TABS
500.0000 mg | ORAL_TABLET | Freq: Two times a day (BID) | ORAL | Status: DC
  Administered 2023-05-31 – 2023-06-11 (×22): 500 mg via ORAL
  Filled 2023-05-31 (×22): qty 1

## 2023-05-31 MED ORDER — PANTOPRAZOLE SODIUM 40 MG PO TBEC
40.0000 mg | DELAYED_RELEASE_TABLET | Freq: Every day | ORAL | Status: DC
  Administered 2023-06-01 – 2023-06-06 (×6): 40 mg via ORAL
  Filled 2023-05-31 (×6): qty 1

## 2023-05-31 MED ORDER — TRAZODONE HCL 50 MG PO TABS
100.0000 mg | ORAL_TABLET | Freq: Every day | ORAL | Status: DC
  Administered 2023-05-31 – 2023-06-10 (×11): 100 mg via ORAL
  Filled 2023-05-31 (×11): qty 2

## 2023-05-31 MED ORDER — APIXABAN 5 MG PO TABS
5.0000 mg | ORAL_TABLET | Freq: Two times a day (BID) | ORAL | Status: DC
  Administered 2023-05-31 – 2023-06-11 (×22): 5 mg via ORAL
  Filled 2023-05-31 (×22): qty 1

## 2023-05-31 MED ORDER — ENOXAPARIN SODIUM 40 MG/0.4ML IJ SOSY
40.0000 mg | PREFILLED_SYRINGE | INTRAMUSCULAR | Status: DC
  Administered 2023-05-31: 40 mg via SUBCUTANEOUS
  Filled 2023-05-31: qty 0.4

## 2023-05-31 MED ORDER — ACETAMINOPHEN 650 MG RE SUPP: 650.0000 mg | RECTAL | Status: DC | PRN

## 2023-05-31 MED ORDER — ACETAMINOPHEN 325 MG PO TABS: 650.0000 mg | ORAL_TABLET | ORAL | Status: DC | PRN

## 2023-05-31 MED ORDER — SODIUM CHLORIDE 0.9 % IV SOLN
2.0000 g | Freq: Once | INTRAVENOUS | Status: AC
  Administered 2023-05-31: 2 g via INTRAVENOUS
  Filled 2023-05-31: qty 20

## 2023-05-31 MED ORDER — STROKE: EARLY STAGES OF RECOVERY BOOK: Freq: Once | Status: AC

## 2023-05-31 MED ORDER — SODIUM CHLORIDE 0.9 % IV BOLUS
1000.0000 mL | Freq: Once | INTRAVENOUS | Status: AC
  Administered 2023-05-31: 1000 mL via INTRAVENOUS

## 2023-05-31 MED ORDER — ATORVASTATIN CALCIUM 20 MG PO TABS
20.0000 mg | ORAL_TABLET | Freq: Every evening | ORAL | Status: DC
  Administered 2023-05-31 – 2023-06-10 (×11): 20 mg via ORAL
  Filled 2023-05-31 (×11): qty 1

## 2023-05-31 MED ORDER — SODIUM CHLORIDE 0.9 % IV SOLN
1.0000 g | INTRAVENOUS | Status: DC
  Administered 2023-06-01 – 2023-06-03 (×3): 1 g via INTRAVENOUS
  Filled 2023-05-31 (×3): qty 10

## 2023-05-31 MED ORDER — IOHEXOL 350 MG/ML SOLN
75.0000 mL | Freq: Once | INTRAVENOUS | Status: AC | PRN
  Administered 2023-05-31: 75 mL via INTRAVENOUS

## 2023-05-31 NOTE — Assessment & Plan Note (Signed)
Per pharmacy review, patient is no longer taking his home Keppra.  Unclear reasoning for this, so we will restart given risk for seizure in the setting of multiple previous CVAs.  - Restart home Keppra

## 2023-05-31 NOTE — Assessment & Plan Note (Addendum)
-   Hold home Eliquis to avoid hemorrhagic conversion if CVA has occurred - No rate controlling agents given slow ventricular rate - Telemetry monitoring given slow ventricular rate

## 2023-05-31 NOTE — ED Notes (Signed)
Advised nurse that patient has ready bed 

## 2023-05-31 NOTE — ED Triage Notes (Signed)
Pt caregiver pt unable to get up this morning. Pt tried to stand but fell to the floor. Pt normally walk and talks normal. Pt was recently admitted for a stroke and had surgery on his carotid artery. Last known normal a little after 9pm last night. Today he has facial droop as well as garbled speech. Caregiver also noted his dragging his right leg.

## 2023-05-31 NOTE — H&P (Signed)
History and Physical    Patient: Richard Carter ZHY:[id number] DOB: [birth year] DOA: 05/31/2023 DOS: the patient was seen and examined on 05/31/2023 PCP: Marisue Ivan, MD  Patient coming from: Home  Chief Complaint:  Chief Complaint  Patient presents with   Facial Droop   HPI: Richard Carter is a 82 y.o. male with medical history significant of recurrent CVA with most recent in May 2024 s/p endarterectomy, A-fib on Eliquis, hypertension, hyperlipidemia, mixed vascular and Alzheimer's dementia, seizure disorder, alcohol use disorder in remission, who presents to the ED due to facial droop, dysarthria and right leg weakness.  History obtained from patient's caretaker bedside due to patient's baseline dementia.  Patient's caretaker states that yesterday, patient was at his baseline.  He was working with physical therapy and they were happy with his improvement.  Then today, patient was unable to stand up and when he did, he fell to the ground.  He did not hit his head or lose consciousness.  Dorene Sorrow helped him stand up and he noted that patient was dragging his right leg.  He also noted significant slurred speech.  Due to this, he was brought to the ER.  Dorene Sorrow states that he has noticed Richard Carter is groaning and moaning when he urinates but he does not vocalize any pain particularly. No abdominal pain at this time.  Dorene Sorrow states that Richard Carter has been experiencing nausea with vomiting over the last 2 days, that has responded to oral antiemetics.  Emesis has been nonbloody.  ED course: On arrival to the ED, patient was normotensive at 112/84 with heart rate of 54.  He was saturating at 97% on room air.  He was afebrile at 97.5.  Initial workup demonstrated WBC of 7.9, hemoglobin of 11.0, bicarb 21, creatinine 1.32 with GFR of 54.  Urinalysis with hematuria, large leukocytes, positive nitrites, and many bacteria. Chest x-ray with no acute cardiopulmonary disease.  CT head and CTA of the head  with no acute intracranial pathology, however interval left carotid endarterectomy with widely patent common and internal carotid arteries with small focal dissection in the proximal external carotid artery.  Patient started on ceftriaxone and IV fluids.  Vascular surgery consulted regarding small dissection.  TRH contacted for admission.  Review of Systems: unable to review all systems due to the inability of the patient to answer questions.  Past Medical History:  Diagnosis Date   Alcohol abuse    Dementia (HCC)    GERD (gastroesophageal reflux disease)    HLD (hyperlipidemia)    HTN (hypertension)    Seizures (HCC)    Stroke [facility])    Past Surgical History:  Procedure Laterality Date   ENDARTERECTOMY Left 04/02/2023   Procedure: ENDARTERECTOMY CAROTID;  Surgeon: Renford Dills, MD;  Location: [facility] ORS;  Service: Vascular;  Laterality: Left;   skin cyst removal     Social History:  reports that he quit smoking about 18 months ago. His smoking use included cigarettes. He has a 34.00 pack-year smoking history. He has never used smokeless tobacco. He reports that he does not currently use alcohol. He reports that he does not use drugs.  No Known Allergies  Family History  Problem Relation Age of Onset   Stroke Mother    Cancer Brother     Prior to Admission medications   Medication Sig Start Date End Date Taking? Authorizing Provider  amLODipine (NORVASC) 10 MG tablet Take 1 tablet (10 mg total) by mouth daily. 04/07/23  Yes Lucianne Muss,  Dileep, MD  apixaban (ELIQUIS) 5 MG TABS tablet Take 1 tablet (5 mg total) by mouth 2 (two) times daily. 05/29/22  Yes Lurene Shadow, MD  ascorbic acid (VITAMIN C) 500 MG tablet Take 1 tablet (500 mg total) by mouth daily. 04/08/23 07/07/23 Yes Gillis Santa, MD  atorvastatin (LIPITOR) 20 MG tablet Take 20 mg by mouth every evening.   Yes [provider]  cyanocobalamin 1000 MCG tablet Take 1 tablet (1,000 mcg total) by mouth daily. 04/08/23 07/07/23  Yes Gillis Santa, MD  food thickener (SIMPLYTHICK, NECTAR/LEVEL 2/MILDLY THICK,) GEL Take 1 packet by mouth as needed. 04/29/22  Yes Swayze, Ava, DO  pantoprazole (PROTONIX) 40 MG tablet Take 1 tablet (40 mg total) by mouth 2 (two) times daily. After sixty days the patient is to continue protonix at 40 mg daily. Patient taking differently: Take 40 mg by mouth daily. 04/29/22  Yes Swayze, Ava, DO  traZODone (DESYREL) 100 MG tablet Take 1 tablet (100 mg total) by mouth at bedtime. 05/11/21  Yes Sreenath, Sudheer B, MD  iron polysaccharides (NIFEREX) 150 MG capsule Take 1 capsule (150 mg total) by mouth daily for 9 days. Patient not taking: Reported on 05/31/2023 04/08/23 04/17/23  Gillis Santa, MD  levETIRAcetam (KEPPRA) 500 MG tablet Take 1 tablet (500 mg total) by mouth 2 (two) times daily. Patient not taking: Reported on 05/31/2023 12/01/20   Alberteen Sam, MD    Physical Exam: Vitals:   05/31/23 1330 05/31/23 1400 05/31/23 1430 05/31/23 1500  BP: (!) 166/85 (!) 179/130 (!) 182/99   Pulse: (!) 42 73 64 66  Resp: 14 17 20 18   Temp:      TempSrc:      SpO2: 98% 99% 98% 98%  Weight:      Height:       Physical Exam Vitals and nursing note reviewed.  Constitutional:      General: He is not in acute distress. HENT:     Head: Normocephalic and atraumatic.     Mouth/Throat:     Mouth: Mucous membranes are moist.     Pharynx: Oropharynx is clear.  Eyes:     Conjunctiva/sclera: Conjunctivae normal.     Pupils: Pupils are equal, round, and reactive to light.  Cardiovascular:     Rate and Rhythm: Bradycardia present. Rhythm irregular.     Heart sounds: No murmur heard. Pulmonary:     Effort: Pulmonary effort is normal. No respiratory distress.     Breath sounds: Normal breath sounds.  Abdominal:     General: Bowel sounds are normal. There is no distension.     Palpations: Abdomen is soft.     Tenderness: There is no abdominal tenderness. There is no guarding.  Musculoskeletal:      Right lower leg: No edema.     Left lower leg: No edema.  Skin:    General: Skin is warm and dry.  Neurological:     Mental Status: He is alert. Mental status is at baseline.     Comments:  Patient is alert and somewhat oriented to situation but soft term memory is impaired.  Subtle right-sided facial droop Dysarthria noted with difficulty understanding patient's words 5 out of 5 strength of bilateral upper and lower extremities Sensation intact throughout  Psychiatric:        Mood and Affect: Mood normal.        Behavior: Behavior normal.        Cognition and Memory: Cognition is impaired. Memory is  impaired.     Data Reviewed: CBC with WBC of 7.9, hemoglobin 11.0, MCV of 89.6 and platelets of 301 CMP with sodium of 137, potassium 3.6, bicarb 21, glucose 98, BUN 20, creatinine 1.32, AST 19, ALT 12 and GFR 54 Urinalysis with small hematuria, large leukocytes, positive nitrites, proteinuria, many bacteria and over 50 WBC and RBC per hpf  EKG personally reviewed.  Narrow complex bradycardia with rate of 48.  No obvious P waves, so likely A-fib with slow ventricular rate.  CT ANGIO HEAD NECK W WO CM  Result Date: 05/31/2023 CLINICAL DATA:  Recently admitted for stroke and carotid endarterectomy. Presents with falling to the floor, unable to get up. EXAM: CT ANGIOGRAPHY HEAD AND NECK WITH AND WITHOUT CONTRAST TECHNIQUE: Multidetector CT imaging of the head and neck was performed using the standard protocol during bolus administration of intravenous contrast. Multiplanar CT image reconstructions and MIPs were obtained to evaluate the vascular anatomy. Carotid stenosis measurements (when applicable) are obtained utilizing NASCET criteria, using the distal internal carotid diameter as the denominator. RADIATION DOSE REDUCTION: This exam was performed according to the departmental dose-optimization program which includes automated exposure control, adjustment of the mA and/or kV according to  patient size and/or use of iterative reconstruction technique. CONTRAST:  75mL OMNIPAQUE IOHEXOL 350 MG/ML SOLN COMPARISON:  CT head 03/27/2023, CTA head/neck 02/18/2023, carotid Doppler 03/27/2023 FINDINGS: CT HEAD FINDINGS Brain: There is no evidence of acute intracranial hemorrhage, extra-axial fluid collection, or acute territorial infarct. Background parenchymal volume loss with prominence of the ventricular system and extra-axial CSF spaces is stable. The ventricles are stable in size. Encephalomalacia in the left frontal and right parietal lobes and a small remote infarct in the left basal ganglia/corona radiata are unchanged. Additional confluent hypodensity in the supratentorial white matter consistent with underlying chronic small-vessel ischemic change is stable. The pituitary and suprasellar region are normal. There is no mass lesion. There is no mass effect or midline shift. Vascular: See below. Skull: Normal. Negative for fracture or focal lesion. Sinuses/Orbits: The paranasal sinuses are clear. The globes and orbits are unremarkable. Other: The mastoid air cells and middle ear cavities are clear. Review of the MIP images confirms the above findings CTA NECK FINDINGS Aortic arch: Is calcified plaque in the imaged aortic arch. The origins of the major branch vessels are patent. The subclavian arteries are patent to the level imaged. Right carotid system: The right common, internal, and external carotid arteries are patent with scattered calcified plaque throughout but no hemodynamically significant stenosis or occlusion. There is no evidence of dissection or aneurysm. Left carotid system: The left common carotid artery is patent with scattered calcified plaque. There has been interval left carotid endarterectomy since the prior CTA from 02/18/2023. The internal carotid artery is widely patent. There is a small focal dissection in the external carotid artery resulting in mild luminal narrowing ([id number]).  There is no evidence of dissection of the internal carotid artery. There is no evidence of aneurysm. Vertebral arteries: Moderate stenosis of the origin of the right vertebral artery is unchanged. The vertebral arteries are otherwise patent, without other hemodynamically significant stenosis or occlusion. There is no evidence of dissection or aneurysm. Skeleton: There is no acute osseous abnormality or suspicious osseous lesion. Multilevel degenerative change of the cervical spine is again seen. There is no visible canal hematoma. Other neck: The soft tissues of the neck are unremarkable. Upper chest: The imaged lung apices are clear. Review of the MIP images confirms the above  findings CTA HEAD FINDINGS Anterior circulation: There is calcified plaque in the intracranial ICAs resulting in mild-to-moderate stenosis bilaterally. The bilateral MCAs and ACAS are patent, without proximal stenosis or occlusion. There is no evidence of dissection or aneurysm. Posterior circulation: The right vertebral artery is diminutive particularly after the PICA origin, but remains patent. This is unchanged. There is calcified plaque at the left V3/V4 junction and proximal V4 segment resulting in up to mild-to-moderate stenosis, unchanged. The distal V4 segment is patent. The basilar artery is patent. The major cerebellar arteries appear patent. The bilateral PCAs are patent, without proximal high-grade stenosis or occlusion. A left posterior communicating artery is identified. There is no aneurysm or AVM. Venous sinuses: Suboptimally evaluated due to bolus timing. Anatomic variants: None. Review of the MIP images confirms the above findings IMPRESSION: 1. No acute intracranial pathology on initial noncontrast head CT. 2. Interval left carotid endarterectomy with widely patent common and internal carotid arteries. There is a small focal dissection in the proximal external carotid artery resulting in mild luminal narrowing. 3. Unchanged  calcified plaque in the right carotid system without hemodynamically significant stenosis or occlusion. 4. Unchanged moderate stenosis of the right vertebral artery origin. 5. Unchanged calcified plaque in the intracranial ICAs and V4 segments with a diminutive right V4 segment but otherwise no high-grade stenosis or occlusion. Electronically Signed   By: Lesia Hausen M.D.   On: 05/31/2023 12:24   DG Chest Portable 1 View  Result Date: 05/31/2023 CLINICAL DATA:  Weakness EXAM: PORTABLE CHEST 1 VIEW COMPARISON:  03/31/2023 FINDINGS: Heart size within normal limits. Aortic atherosclerosis. No focal airspace consolidation, pleural effusion, or pneumothorax. Multiple old healed bilateral rib fractures. IMPRESSION: No active cardiopulmonary disease. Electronically Signed   By: Duanne Guess D.O.   On: 05/31/2023 12:07    Results are pending, will review when available.  Assessment and Plan:  * Acute CVA (cerebrovascular accident) [facility]) Patient has a history of recurrent CVA s/p recent endarterectomy presenting with slurred speech, right facial droop and right leg weakness.  Dysarthria has persisted with subtle facial droop but weakness has improved.  CTA with no evidence of LVO; small dissection noted, however vascular surgery reported it is not concerning.  - MRI brain pending - Pending MRI, will consult neurology - Telemetry monitoring - Allow for permissive HTN (systolic < 220 and diastolic < 120) until MRI results are returned - Lipid panel  - Hold home Eliquis to avoid hemorrhagic conversion - Statin   - PT/OT/SLP  UTI (urinary tract infection) Patient has a history of recurrent UTI presenting with nausea, vomiting, and painful urination.  History of recurrent UTI and given AKI, will need to rule out nephrolithiasis.  - Renal stones CT - Continue ceftriaxone 1 g daily - Urine culture pending  AKI (acute kidney injury) (HCC) Unclear etiology of AKI given symptom onset has been rapid.   Will rule out obstructive etiology with CT.  - Gentle IV fluids - CT renal stone study pending - Avoid nephrotoxic agents - Daily BMP - Strict in and out  AF (paroxysmal atrial fibrillation) (HCC) - Hold home Eliquis to avoid hemorrhagic conversion if CVA has occurred - No rate controlling agents given slow ventricular rate - Telemetry monitoring given slow ventricular rate  HTN (hypertension) - Hold home antihypertensives until MRI of the brain is completed  Seizures [facility]) Per pharmacy review, patient is no longer taking his home Keppra.  Unclear reasoning for this, so we will restart given risk for seizure  in the setting of multiple previous CVAs.  - Restart home Keppra  Dementia (HCC) - Delirium precautions - Continue home trazodone  Advance Care Planning:   Code Status: DNR/DNI. Confirmed patient has been a DNR/DNI for an extended time and this is still active.   Consults: Vascular surgery  Family Communication: Patient's sister and caretaker updated at bedside  Severity of Illness: The appropriate patient status for this patient is OBSERVATION. Observation status is judged to be reasonable and necessary in order to provide the required intensity of service to ensure the patient's safety. The patient's presenting symptoms, physical exam findings, and initial radiographic and laboratory data in the context of their medical condition is felt to place them at decreased risk for further clinical deterioration. Furthermore, it is anticipated that the patient will be medically stable for discharge from the hospital within 2 midnights of admission.   Author: Verdene Lennert, MD 05/31/2023 3:30 PM  For on call review [email or web address].

## 2023-05-31 NOTE — ED Notes (Signed)
Urine specimen obtained via urinal, sent to lab.

## 2023-05-31 NOTE — ED Notes (Signed)
This RN evaluated patient and completed another NIH stroke assessment at this time. Patient's stroke scale total has increased and patient's speech has declined. MD Erma Heritage made aware.

## 2023-05-31 NOTE — Plan of Care (Signed)
Discussed with Dr. Huel Cote and data reviewed, preliminary recommends below to be utilized overnight until full exam tomorrow.   Assessment: Patient well known to neurology service. In reviewing imaging and history as documented and verbally reported to me, new stroke is so small and given he is back to baseline at this time except possibly mild facial droop (which has been present in the past), risk of stopping anticoagulation outweighed by benefit of continuing it.   Recs:  Please continue Eliquis at this time if no medical contraindication Permissive hypertension to 180/100 as we are continuing Eliquis, to reduce ICH risk  No need to repeat A1c and lipids as they were meeting goal 1 month ago.  No need to order ECHO from neurological perspective (already indicated for long term AC) No need for PT/OT/SLP if patient is at baseline now Appreciate management of AKI per primary team Will clarify seizure history again, per my colleague's last note in May "Caretaker provides new history today. He states that the patient's last seizure was 2-3 years ago in the context of EtOH withdrawal. Since then the patient has been in a supervised living environment and "he has not touched a drop". It is possible keppra was tapered in this setting; discontinuation may help his cognitive function though his significant brain atrophy does put him at risk of seizure. Okay to continue at this time.  Full neuro consult to follow tomorrow, will consider addition of aspirin   Data: MRI brain personally reviewed, agree with radiology:   1. Punctate acute to early subacute infarct in the left internal capsule/basal ganglia, and probable late subacute to early chronic infarct in the left corona radiata. 2. Additional remote infarcts, background chronic small-vessel ischemic change, and parenchymal volume loss are not significantly changed.   CTA head and neck personally reviewed, agree with radiology:   1. No acute  intracranial pathology on initial noncontrast head CT. 2. Interval left carotid endarterectomy with widely patent common and internal carotid arteries. There is a small focal dissection in the proximal external carotid artery resulting in mild luminal narrowing. 3. Unchanged calcified plaque in the right carotid system without hemodynamically significant stenosis or occlusion. 4. Unchanged moderate stenosis of the right vertebral artery origin. 5. Unchanged calcified plaque in the intracranial ICAs and V4 segments with a diminutive right V4 segment but otherwise no high-grade stenosis or occlusion.  CT renal stone study 1. No acute abdominopelvic findings. 2. Excreted contrast within bilateral renal collecting systems limiting assessment for the possibility of nonobstructing stones. No hydronephrosis. 3. Urinary bladder wall appears somewhat thickened and trabeculated, which may be related to chronic bladder outlet obstruction in the setting of mild prostatomegaly. Correlate with urinalysis to exclude cystitis. 4. Cholelithiasis. 5. Moderate hiatal hernia. 6. Aortic atherosclerosis (ICD10-I70.0).   ECHO 04/22/2022  1. Left ventricular ejection fraction, by estimation, is 55 to 60%. The  left ventricle has normal function. The left ventricle has no regional  wall motion abnormalities. Left ventricular diastolic parameters are  indeterminate.   2. Right ventricular systolic function is normal. The right ventricular  size is normal.   3. The mitral valve is normal in structure. Mild to moderate mitral valve  regurgitation. No evidence of mitral stenosis.   4. The aortic valve is normal in structure. Aortic valve regurgitation is  not visualized. No aortic stenosis is present.   5. The inferior vena cava is normal in size with greater than 50%  respiratory variability, suggesting right atrial pressure of 3 mmHg.  Lab Results  Component Value Date   HGBA1C 5.8 (H) 03/27/2023     Lab Results  Component Value Date   CHOL 120 03/28/2023   HDL 40 (L) 03/28/2023   LDLCALC 65 03/28/2023   TRIG 74 03/28/2023   CHOLHDL 3.0 03/28/2023

## 2023-05-31 NOTE — ED Notes (Signed)
Pt in MRI.

## 2023-05-31 NOTE — Assessment & Plan Note (Signed)
-   Hold home antihypertensives until MRI of the brain is completed

## 2023-05-31 NOTE — Progress Notes (Signed)
PHARMACIST - PHYSICIAN ORDER COMMUNICATION  CONCERNING: P&T Medication Policy on Herbal Medications  DESCRIPTION:  This patient's order for:  food thickener  has been noted.  This product(s) is classified as an "herbal" or natural product. Due to a lack of definitive safety studies or FDA approval, nonstandard manufacturing practices, plus the potential risk of unknown drug-drug interactions while on inpatient medications, the Pharmacy and Therapeutics Committee does not permit the use of "herbal" or natural products of this type within [facility].   ACTION TAKEN: The pharmacy department is unable to verify this order at this time. Please reevaluate patient's clinical condition at discharge and address if the herbal or natural product(s) should be resumed at that time.   Elliot Gurney, PharmD, BCPS Clinical Pharmacist  05/31/2023 3:35 PM

## 2023-05-31 NOTE — Assessment & Plan Note (Addendum)
Patient has a history of recurrent UTI presenting with nausea, vomiting, and painful urination.  History of recurrent UTI and given AKI, will need to rule out nephrolithiasis.  - Renal stones CT - Continue ceftriaxone 1 g daily - Urine culture pending

## 2023-05-31 NOTE — Assessment & Plan Note (Signed)
-   Delirium precautions - Continue home trazodone

## 2023-05-31 NOTE — Assessment & Plan Note (Signed)
Unclear etiology of AKI given symptom onset has been rapid.  Will rule out obstructive etiology with CT.  - Gentle IV fluids - CT renal stone study pending - Avoid nephrotoxic agents - Daily BMP - Strict in and out

## 2023-05-31 NOTE — Assessment & Plan Note (Addendum)
Patient has a history of recurrent CVA s/p recent endarterectomy presenting with slurred speech, right facial droop and right leg weakness.  Dysarthria has persisted with subtle facial droop but weakness has improved.  CTA with no evidence of LVO; small dissection noted, however vascular surgery reported it is not concerning.  - MRI brain pending - Pending MRI, will consult neurology - Telemetry monitoring - Allow for permissive HTN (systolic < 220 and diastolic < 120) until MRI results are returned - Lipid panel  - Hold home Eliquis to avoid hemorrhagic conversion - Statin   - PT/OT/SLP

## 2023-05-31 NOTE — ED Notes (Signed)
Pt. Transported to 1C, receiving RN aware.

## 2023-05-31 NOTE — Consult Note (Shared)
To be seen 7/7

## 2023-05-31 NOTE — ED Provider Notes (Signed)
[facility] Provider Note    Event Date/Time   First MD Initiated Contact with Patient 05/31/23 1108     (approximate)   History   Facial Droop   HPI  Richard Carter is a 82 y.o. male  here with difficulty speaking, weakness. Pt has h/o CVA, carotid endarterectomy with Dr. Gilda Crease 03/2023. Per caregiver, he was in his usual state of health yesterday. Woke up this morning and has had slurred speech and weakness. Seemed to be dragging his R leg. He has been less awake. No known trauma, falls. Denies any complaints on my assessment.    Physical Exam   Triage Vital Signs: ED Triage Vitals  Enc Vitals Group     BP 05/31/23 1033 112/84     Pulse Rate 05/31/23 1033 80     Resp 05/31/23 1033 18     Temp 05/31/23 1033 (!) 97.5 F (36.4 C)     Temp Source 05/31/23 1033 Oral     SpO2 05/31/23 1033 97 %     Weight 05/31/23 1034 165 lb (74.8 kg)     Height 05/31/23 1034 5\' 7"  (1.702 m)     Head Circumference --      Peak Flow --      Pain Score 05/31/23 1117 0     Pain Loc --      Pain Edu? --      Excl. in GC? --     Most recent vital signs: Vitals:   05/31/23 1430 05/31/23 1500  BP: (!) 182/99   Pulse: 64 66  Resp: 20 18  Temp:    SpO2: 98% 98%     General: Awake, no distress.  CV:  Good peripheral perfusion. RRR. No murmurs. Resp:  Normal work of breathing. Lungs CTAB. Abd:  No distention. No tenderness. Other:  Speech slightly slurred but not aphasic. Subtle R NLF. Strength 5/5 bl UE and LE. Normal sensation to light touch bl UE and LE.   ED Results / Procedures / Treatments   Labs (all labs ordered are listed, but only abnormal results are displayed) Labs Reviewed  CBC WITH DIFFERENTIAL/PLATELET - Abnormal; Notable for the following components:      Result Value   RBC 3.85 (*)    Hemoglobin 11.0 (*)    HCT 34.5 (*)    RDW 20.0 (*)    All other components within normal limits  COMPREHENSIVE METABOLIC PANEL - Abnormal; Notable  for the following components:   CO2 21 (*)    Creatinine, Ser 1.32 (*)    GFR, Estimated 54 (*)    All other components within normal limits  URINALYSIS, ROUTINE W REFLEX MICROSCOPIC - Abnormal; Notable for the following components:   Color, Urine YELLOW (*)    APPearance TURBID (*)    Hgb urine dipstick SMALL (*)    Protein, ur 30 (*)    Nitrite POSITIVE (*)    Leukocytes,Ua LARGE (*)    Bacteria, UA MANY (*)    All other components within normal limits  URINE CULTURE  CBG MONITORING, ED     EKG Sinus bradycardia, VR 48. PR 139, QRS 107, QTc 402. No acute ST elevations or depressions. No ischemia or infarct.   RADIOLOGY CT Angio Head/Neck: No acute pathology, small focal area of dissection L ECA   I also independently reviewed and agree with radiologist interpretations.   PROCEDURES:  Critical Care performed: No  .1-3 Lead EKG Interpretation  Performed by: Erma Heritage,  Sheria Lang, MD Authorized by: Shaune Pollack, MD     Interpretation: normal     ECG rate:  60-80   ECG rate assessment: normal     Rhythm: sinus rhythm     Ectopy: none     Conduction: normal   Comments:     Indication: Weakness    MEDICATIONS ORDERED IN ED: Medications   stroke: early stages of recovery book (has no administration in time range)  acetaminophen (TYLENOL) tablet 650 mg (has no administration in time range)    Or  acetaminophen (TYLENOL) 160 MG/5ML solution 650 mg (has no administration in time range)    Or  acetaminophen (TYLENOL) suppository 650 mg (has no administration in time range)  senna-docusate (Senokot-S) tablet 1 tablet (has no administration in time range)  enoxaparin (LOVENOX) injection 40 mg (has no administration in time range)  cefTRIAXone (ROCEPHIN) 1 g in sodium chloride 0.9 % 100 mL IVPB (has no administration in time range)  atorvastatin (LIPITOR) tablet 20 mg (has no administration in time range)  levETIRAcetam (KEPPRA) tablet 500 mg (has no administration in  time range)  traZODone (DESYREL) tablet 100 mg (has no administration in time range)  pantoprazole (PROTONIX) EC tablet 40 mg (has no administration in time range)  sodium chloride 0.9 % bolus 1,000 mL (1,000 mLs Intravenous New Bag/Given 05/31/23 1202)  iohexol (OMNIPAQUE) 350 MG/ML injection 75 mL (75 mLs Intravenous Contrast Given 05/31/23 1136)  cefTRIAXone (ROCEPHIN) 2 g in sodium chloride 0.9 % 100 mL IVPB (2 g Intravenous New Bag/Given 05/31/23 1305)     IMPRESSION / MDM / ASSESSMENT AND PLAN / ED COURSE  I reviewed the triage vital signs and the nursing notes.                              Differential diagnosis includes, but is not limited to, CVA, TIA, reactivation of old stroke sx in setting of UTI, dehydration, other medical etiology  Patient's presentation is most consistent with acute presentation with potential threat to life or bodily function.  The patient is on the cardiac monitor to evaluate for evidence of arrhythmia and/or significant heart rate changes  82 yo M here with slurred speech, acute on chronic weakness. Pt is outside of tPA window, on Eliquis, and does not meet intervention criteria (no weakness). CT negative. CT angio obtained given his recent carotid endarterectomy, shows small ECA dissection - discussed with Dr. Myra Gianotti, who does not feel this is likely contributing. UA shows possible UTI.  CBC without leukocytosis. CMP shows mild Cr elevation likely from dehydration.  Plan to obtain MRI, admit to medicine. Rocephin given for UTI.   FINAL CLINICAL IMPRESSION(S) / ED DIAGNOSES   Final diagnoses:  Acute cystitis without hematuria  Stroke-like symptoms     Rx / DC Orders   ED Discharge Orders     None        Note:  This document was prepared using Dragon voice recognition software and may include unintentional dictation errors.   Shaune Pollack, MD 05/31/23 [phone]

## 2023-06-01 ENCOUNTER — Observation Stay: Payer: Medicare HMO

## 2023-06-01 DIAGNOSIS — I639 Cerebral infarction, unspecified: Secondary | ICD-10-CM | POA: Diagnosis not present

## 2023-06-01 DIAGNOSIS — I6389 Other cerebral infarction: Secondary | ICD-10-CM | POA: Diagnosis present

## 2023-06-01 DIAGNOSIS — F0280 Dementia in other diseases classified elsewhere without behavioral disturbance: Secondary | ICD-10-CM | POA: Diagnosis present

## 2023-06-01 DIAGNOSIS — R531 Weakness: Secondary | ICD-10-CM | POA: Diagnosis not present

## 2023-06-01 DIAGNOSIS — N3000 Acute cystitis without hematuria: Secondary | ICD-10-CM | POA: Diagnosis present

## 2023-06-01 DIAGNOSIS — K219 Gastro-esophageal reflux disease without esophagitis: Secondary | ICD-10-CM | POA: Diagnosis present

## 2023-06-01 DIAGNOSIS — R1310 Dysphagia, unspecified: Secondary | ICD-10-CM | POA: Diagnosis present

## 2023-06-01 DIAGNOSIS — G40909 Epilepsy, unspecified, not intractable, without status epilepticus: Secondary | ICD-10-CM | POA: Diagnosis present

## 2023-06-01 DIAGNOSIS — R29708 NIHSS score 8: Secondary | ICD-10-CM | POA: Diagnosis present

## 2023-06-01 DIAGNOSIS — Z7901 Long term (current) use of anticoagulants: Secondary | ICD-10-CM | POA: Diagnosis not present

## 2023-06-01 DIAGNOSIS — Z8673 Personal history of transient ischemic attack (TIA), and cerebral infarction without residual deficits: Secondary | ICD-10-CM | POA: Diagnosis not present

## 2023-06-01 DIAGNOSIS — I480 Paroxysmal atrial fibrillation: Secondary | ICD-10-CM | POA: Diagnosis present

## 2023-06-01 DIAGNOSIS — R29810 Facial weakness: Secondary | ICD-10-CM | POA: Diagnosis present

## 2023-06-01 DIAGNOSIS — I10 Essential (primary) hypertension: Secondary | ICD-10-CM | POA: Diagnosis present

## 2023-06-01 DIAGNOSIS — G309 Alzheimer's disease, unspecified: Secondary | ICD-10-CM | POA: Diagnosis present

## 2023-06-01 DIAGNOSIS — R4781 Slurred speech: Secondary | ICD-10-CM | POA: Diagnosis present

## 2023-06-01 DIAGNOSIS — F0150 Vascular dementia without behavioral disturbance: Secondary | ICD-10-CM | POA: Diagnosis present

## 2023-06-01 DIAGNOSIS — G8311 Monoplegia of lower limb affecting right dominant side: Secondary | ICD-10-CM | POA: Diagnosis present

## 2023-06-01 DIAGNOSIS — Z87891 Personal history of nicotine dependence: Secondary | ICD-10-CM | POA: Diagnosis not present

## 2023-06-01 DIAGNOSIS — F1011 Alcohol abuse, in remission: Secondary | ICD-10-CM | POA: Diagnosis present

## 2023-06-01 DIAGNOSIS — N179 Acute kidney failure, unspecified: Secondary | ICD-10-CM | POA: Diagnosis present

## 2023-06-01 DIAGNOSIS — N401 Enlarged prostate with lower urinary tract symptoms: Secondary | ICD-10-CM | POA: Diagnosis present

## 2023-06-01 DIAGNOSIS — Z79899 Other long term (current) drug therapy: Secondary | ICD-10-CM | POA: Diagnosis not present

## 2023-06-01 DIAGNOSIS — E782 Mixed hyperlipidemia: Secondary | ICD-10-CM | POA: Diagnosis present

## 2023-06-01 DIAGNOSIS — B9620 Unspecified Escherichia coli [E. coli] as the cause of diseases classified elsewhere: Secondary | ICD-10-CM | POA: Diagnosis present

## 2023-06-01 DIAGNOSIS — Z66 Do not resuscitate: Secondary | ICD-10-CM | POA: Diagnosis present

## 2023-06-01 LAB — CBC WITH DIFFERENTIAL/PLATELET
Abs Immature Granulocytes: 0.05 10*3/uL (ref 0.00–0.07)
Basophils Absolute: 0 10*3/uL (ref 0.0–0.1)
Basophils Relative: 0 %
Eosinophils Absolute: 0.3 10*3/uL (ref 0.0–0.5)
Eosinophils Relative: 4 %
HCT: 31.9 % — ABNORMAL LOW (ref 39.0–52.0)
Hemoglobin: 10.2 g/dL — ABNORMAL LOW (ref 13.0–17.0)
Immature Granulocytes: 1 %
Lymphocytes Relative: 15 %
Lymphs Abs: 1.1 10*3/uL (ref 0.7–4.0)
MCH: 28.1 pg (ref 26.0–34.0)
MCHC: 32 g/dL (ref 30.0–36.0)
MCV: 87.9 fL (ref 80.0–100.0)
Monocytes Absolute: 0.4 10*3/uL (ref 0.1–1.0)
Monocytes Relative: 6 %
Neutro Abs: 5.4 10*3/uL (ref 1.7–7.7)
Neutrophils Relative %: 74 %
Platelets: 294 10*3/uL (ref 150–400)
RBC: 3.63 MIL/uL — ABNORMAL LOW (ref 4.22–5.81)
RDW: 20.3 % — ABNORMAL HIGH (ref 11.5–15.5)
WBC: 7.3 10*3/uL (ref 4.0–10.5)
nRBC: 0 % (ref 0.0–0.2)

## 2023-06-01 LAB — BASIC METABOLIC PANEL
Anion gap: 8 (ref 5–15)
BUN: 21 mg/dL (ref 8–23)
CO2: 22 mmol/L (ref 22–32)
Calcium: 8.9 mg/dL (ref 8.9–10.3)
Chloride: 109 mmol/L (ref 98–111)
Creatinine, Ser: 1.17 mg/dL (ref 0.61–1.24)
GFR, Estimated: >60 mL/min (ref >60)
Glucose, Bld: 94 mg/dL (ref 70–99)
Potassium: 4 mmol/L (ref 3.5–5.1)
Sodium: 139 mmol/L (ref 135–145)

## 2023-06-01 LAB — URINE CULTURE

## 2023-06-01 MED ORDER — TAMSULOSIN HCL 0.4 MG PO CAPS
0.4000 mg | ORAL_CAPSULE | Freq: Every day | ORAL | Status: DC
  Administered 2023-06-02 – 2023-06-11 (×10): 0.4 mg via ORAL
  Filled 2023-06-01 (×10): qty 1

## 2023-06-01 NOTE — Evaluation (Signed)
Occupational Therapy Evaluation Patient Details Name: Richard Carter MRN: [id number] DOB: June 16, 1941 Today's Date: 06/01/2023   History of Present Illness Pt admitted to [facility] on 05/31/23 under observation for c/o stroke like symptoms including: facial droop, dysarthria, and RLE weakness. Pt outside the window for tPA. Imaging significant for: punctate acute to early subacute infarct in the L internal capsule/basal ganglia, and probable late subacute to early chronic infarct in the L corona radiata. Significant PMH includes: hx CVA, carotid endarterectomy (03/2023), Afib on Eliquis, HTN, hLD, mixed vascular and Alzheimer's dementia, seizure disorder, alcohol use disorder (in remission).   Clinical Impression   Patient received for OT evaluation. See flowsheet below for details of function. Generally, patient requiring supervision for bed mobility, CGA for sidestepping along EOB (significantly decreased activity tolerance), and MOD-MAX A for ADLs. Patient will benefit from continued OT while in acute care.       Recommendations for follow up therapy are one component of a multi-disciplinary discharge planning process, led by the attending physician.  Recommendations may be updated based on patient status, additional functional criteria and insurance authorization.   Assistance Recommended at Discharge Frequent or constant Supervision/Assistance  Patient can return home with the following A lot of help with walking and/or transfers;A lot of help with bathing/dressing/bathroom;Assistance with cooking/housework;Direct supervision/assist for medications management;Direct supervision/assist for financial management;Assist for transportation;Help with stairs or ramp for entrance    Functional Status Assessment  Patient has had a recent decline in their functional status and demonstrates the ability to make significant improvements in function in a reasonable and predictable amount of time.  Equipment  Recommendations  Other (comment) (defer to next venue of care)    Recommendations for Other Services       Precautions / Restrictions Precautions Precautions: Fall Restrictions Weight Bearing Restrictions: No      Mobility Bed Mobility Overal bed mobility: Needs Assistance Bed Mobility: Supine to Sit, Sit to Supine     Supine to sit: Supervision Sit to supine: Supervision   General bed mobility comments: HOB elevated, use of bed rails for support, increased time/effort    Transfers Overall transfer level: Needs assistance Equipment used: Rolling walker (2 wheels) Transfers: Sit to/from Stand Sit to Stand: Min assist           General transfer comment: RW used; cues for safety.      Balance Overall balance assessment: Needs assistance Sitting-balance support: Feet supported, Bilateral upper extremity supported Sitting balance-Leahy Scale: Good     Standing balance support: Bilateral upper extremity supported, During functional activity, Reliant on assistive device for balance Standing balance-Leahy Scale: Poor                             ADL either performed or assessed with clinical judgement   ADL Overall ADL's : Needs assistance/impaired                                     Functional mobility during ADLs: Rolling walker (2 wheels);Min guard (sidesteps towards HOB) General ADL Comments: Pt generally weak and with decreased activity tolerance. Anticipate MOD-MAX A with most ADLs; anticipate pt to be able to self-feed with extra time from semi-reclined in bed. Anticipate able to t/f to [facility] with CGA-MIN A with RW; not likely safe to walk away from edge of bed at this time.  Vision Patient Visual Report: No change from baseline       Perception     Praxis      Pertinent Vitals/Pain Pain Assessment Pain Assessment: No/denies pain     Hand Dominance Right   Extremity/Trunk Assessment Upper Extremity Assessment Upper  Extremity Assessment: Generalized weakness (Pt with BIL weakness of shoulders; R slightly worse than L (decreased ROM to approx 70 degrees flexion at shoulders); Decreased coordination on finger-nose testing bilaterally and pt fatiguing quickly.)   Lower Extremity Assessment Lower Extremity Assessment: Defer to PT evaluation;Generalized weakness       Communication Communication Communication: Expressive difficulties   Cognition Arousal/Alertness: Awake/alert Behavior During Therapy: WFL for tasks assessed/performed Overall Cognitive Status: Difficult to assess                                 General Comments: Pt is oriented to person, place, not to time or situation.     General Comments  Pt on room air. NA in room taking orthostatic vitals, although pt holding heavily on RW in standing x1 minute, therefore BP machine having difficulty reading. Pt only able to tolerate standing for about 1 minute, then sidestepped towards HOB (to the L) with CGA and OT constant cues, as pt trying to sit down.    Exercises     Shoulder Instructions      Home Living Family/patient expects to be discharged to:: Private residence Living Arrangements: Alone Available Help at Discharge: Available 24 hours/day;Neighbor Type of Home: Mobile home Home Access: Ramped entrance     Home Layout: One level     Bathroom Shower/Tub: Producer, television/film/video: Handicapped height Bathroom Accessibility: Yes   Home Equipment: Grab bars - tub/shower;Shower seat;Grab bars - toilet;Rolling Environmental consultant (2 wheels);BSC/3in1;Wheelchair Financial trader (4 wheels)   Additional Comments: Home set up obtained from medical record      Prior Functioning/Environment Prior Level of Function : Needs assist;History of Falls (last six months)             Mobility Comments: Per chart review and previous hospital admission "Modified independent ambulating with RW." ADLs Comments: Per chart review  and previous hospital admission "Neighbor (caregiver) reports he shares POA with pt's son and assists pt with ADLs/IADLs (dressing, bathing, medications, meals, cleaning, etc)--assists pt 7 days a week and assists 12 plus hours a day. States that pt typically does toileting, grooming, and eating independently. information is per prior notes"        OT Problem List: Decreased strength;Decreased activity tolerance;Decreased range of motion;Impaired balance (sitting and/or standing);Decreased cognition;Decreased safety awareness      OT Treatment/Interventions: Self-care/ADL training;Therapeutic exercise;DME and/or AE instruction;Therapeutic activities;Patient/family education    OT Goals(Current goals can be found in the care plan section) Acute Rehab OT Goals Patient Stated Goal: Go home OT Goal Formulation: Patient unable to participate in goal setting Time For Goal Achievement: 06/15/23 Potential to Achieve Goals: Fair ADL Goals Pt Will Perform Grooming: with supervision;sitting Pt Will Perform Lower Body Dressing: with min assist;sit to/from stand Pt Will Transfer to Toilet: with min guard assist;ambulating;bedside commode Pt Will Perform Toileting - Clothing Manipulation and hygiene: with min guard assist;sit to/from stand Pt Will Perform Tub/Shower Transfer: Shower transfer;with min guard assist;rolling walker;ambulating;shower seat  OT Frequency: Min 1X/week    Co-evaluation              AM-PAC OT "6 Clicks" Daily Activity  Outcome Measure Help from another person eating meals?: None Help from another person taking care of personal grooming?: A Little Help from another person toileting, which includes using toliet, bedpan, or urinal?: A Lot Help from another person bathing (including washing, rinsing, drying)?: A Lot Help from another person to put on and taking off regular upper body clothing?: A Lot Help from another person to put on and taking off regular lower body  clothing?: A Lot 6 Click Score: 15   End of Session Equipment Utilized During Treatment: Rolling walker (2 wheels) Nurse Communication: Mobility status (NA communication)  Activity Tolerance: Patient limited by fatigue Patient left: in bed;with call bell/phone within reach;with bed alarm set  OT Visit Diagnosis: Unsteadiness on feet (R26.81)                Time: 4098-1191 OT Time Calculation (min): 19 min Charges:  OT General Charges $OT Visit: 1 Visit OT Evaluation $OT Eval Moderate Complexity: 1 Mod  Shelly Shoultz Junie Panning, MS, OTR/L  Alvester Morin 06/01/2023, 4:11 PM

## 2023-06-01 NOTE — Progress Notes (Signed)
SLP Cancellation Note  Patient Details Name: Richard Carter MRN: [id number] DOB: [birth year]   Cancelled treatment:       Reason Eval/Treat Not Completed: Patient at procedure or test/unavailable > SLP consult received and appreciated. Chart review completed. Pt OTF for Head CT. Will continue efforts as appropriate.  Clyde Canterbury, M.S., CCC-SLP Speech-Language Pathologist [facility] [phone] Richard Carter)  Richard Carter 06/01/2023, 10:11 AM

## 2023-06-01 NOTE — Evaluation (Signed)
Clinical/Bedside Swallow Evaluation Patient Details  Name: RAJINDER DOBBERSTEIN MRN: [id number] Date of Birth: [birth year]  Today's Date: 06/01/2023 Time: SLP Start Time (ACUTE ONLY): 1140 SLP Stop Time (ACUTE ONLY): 1155 SLP Time Calculation (min) (ACUTE ONLY): 15 min  Past Medical History:  Past Medical History:  Diagnosis Date   Alcohol abuse    Dementia (HCC)    GERD (gastroesophageal reflux disease)    HLD (hyperlipidemia)    HTN (hypertension)    Seizures (HCC)    Stroke [facility])    Past Surgical History:  Past Surgical History:  Procedure Laterality Date   ENDARTERECTOMY Left 04/02/2023   Procedure: ENDARTERECTOMY CAROTID;  Surgeon: Renford Dills, MD;  Location: [facility] ORS;  Service: Vascular;  Laterality: Left;   skin cyst removal     HPI:  Pt admitted to [facility] on 05/31/23 under observation for c/o stroke like symptoms including: facial droop, dysarthria, and RLE weakness. Pt outside the window for tPA. Imaging significant for: punctate acute to early subacute infarct in the L internal capsule/basal ganglia, and probable late subacute to early chronic infarct in the L corona radiata. Significant PMH includes: hx CVA, carotid endarterectomy (03/2023), Afib on Eliquis, HTN, hLD, mixed vascular and Alzheimer's dementia, seizure disorder, alcohol use disorder (in remission).    Assessment / Plan / Recommendation  Clinical Impression  Pt seen for clinical swallowing evaluation. Pt alert, pleasant, and cooperative. Notable dysarthria and anomia. ?baseline level of functioning given previous SLP evalautions. No family present to provide collateral. Pt with known hx of dysphagia with MBSS completed May 2024 with recommendation for a puree diet with nectar-thick liquids. Trials today were limited to puree and nectar-thick liquids given hx of dysphagia. Pt dependent for feeding this date due to dominant UE weakness. Pt with s/sx oral dysphagia including oral holding and "swishing" of nectar-thick  liquids. Concern for pharyngeal dysphagia given immediate cough with initial nectar-thick liquid trial. No other overt s/sx concerning for pharyngeal dysphagia noted. Recommend cautious initiation of a puree diet with nectar-thick liquids with safe swallowing strategies aspiration precautions as outlined below, including 1:1 assistance for feeding. SLP to f/u per POC for diet tolerance and pt/family education.  SLP Visit Diagnosis: Dysphagia, oropharyngeal phase (R13.12)    Aspiration Risk  Moderate aspiration risk    Diet Recommendation Dysphagia 1 (Puree);Nectar-thick liquid    Liquid Administration via: Cup;No straw Medication Administration: Crushed with puree Supervision: Full supervision/cueing for compensatory strategies Compensations: Minimize environmental distractions;Slow rate;Small sips/bites Postural Changes: Seated upright at 90 degrees;Remain upright for at least 30 minutes after po intake    Other  Recommendations Oral Care Recommendations: Oral care QID;Staff/trained caregiver to provide oral care    Recommendations for follow up therapy are one component of a multi-disciplinary discharge planning process, led by the attending physician.  Recommendations may be updated based on patient status, additional functional criteria and insurance authorization.  Follow up Recommendations Skilled nursing-short term rehab (<3 hours/day)         Functional Status Assessment Patient has had a recent decline in their functional status and demonstrates the ability to make significant improvements in function in a reasonable and predictable amount of time.  Frequency and Duration min 2x/week  2 weeks       Prognosis Prognosis for improved oropharyngeal function: Guarded Barriers to Reach Goals: Severity of deficits;Time post onset      Swallow Study   General Date of Onset: 05/31/23 HPI: Pt admitted to [facility] on 05/31/23 under observation for c/o stroke  like symptoms including:  facial droop, dysarthria, and RLE weakness. Pt outside the window for tPA. Imaging significant for: punctate acute to early subacute infarct in the L internal capsule/basal ganglia, and probable late subacute to early chronic infarct in the L corona radiata. Significant PMH includes: hx CVA, carotid endarterectomy (03/2023), Afib on Eliquis, HTN, hLD, mixed vascular and Alzheimer's dementia, seizure disorder, alcohol use disorder (in remission). Type of Study: Bedside Swallow Evaluation Previous Swallow Assessment: known to SLP services; MBSS 04/01/23 recommended puree with nectar-thick liquids Diet Prior to this Study: Dysphagia 1 (pureed);Mildly thick liquids (Level 2, nectar thick) Temperature Spikes Noted: No History of Recent Intubation: No Behavior/Cognition: Alert;Requires cueing;Cooperative Oral Cavity Assessment: Within Functional Limits Oral Care Completed by SLP: Yes Oral Cavity - Dentition: Edentulous Vision: Functional for self-feeding Self-Feeding Abilities: Total assist Patient Positioning: Upright in bed Baseline Vocal Quality: Hoarse;Low vocal intensity Volitional Cough: Strong Volitional Swallow: Able to elicit    Oral/Motor/Sensory Function Overall Oral Motor/Sensory Function: Mild impairment Facial ROM: Reduced right Lingual ROM: Reduced right;Reduced left   Ice Chips Ice chips: Not tested   Thin Liquid Thin Liquid: Not tested    Nectar Thick Nectar Thick Liquid: Impaired Presentation: Cup Oral Phase Impairments: Poor awareness of bolus Oral phase functional implications: Oral holding (swishing; inconsistent) Pharyngeal Phase Impairments: Cough - Immediate (on initial trial only)   Honey Thick Honey Thick Liquid: Not tested   Puree Puree: Within functional limits Presentation: Self Fed   Solid     Solid: Not tested     Clyde Canterbury, M.S., CCC-SLP Speech-Language Pathologist [facility] [phone] (ASCOM)  Alessandra Bevels  Kyndel Egger 06/01/2023,1:26 PM

## 2023-06-01 NOTE — Progress Notes (Addendum)
[facility]  - [place] [facility]   PATIENT NAME: Richard Carter    MR#:  [id number]  DATE OF BIRTH:  1941/05/23  SUBJECTIVE:  no family at bedside. Patient working with speech therapy able to tolerate current diet. Patient has dysarthria speech. Has significant right upper and lower extremity weakness. Came in again with recurrent stroke. Repeat CT head this morning per neurology request remains same.    VITALS:  Blood pressure (!) 175/68, pulse (!) 55, temperature (!) 97.5 F (36.4 C), resp. rate 18, height 5\' 7"  (1.702 m), weight 74.8 kg, SpO2 96 %.  PHYSICAL EXAMINATION:   GENERAL:  82 y.o.-year-old patient with no acute distress.disheveled  LUNGS: Normal breath sounds bilaterally, no wheezing CARDIOVASCULAR: S1, S2 normal. No murmur   ABDOMEN: Soft, nontender, nondistended. Bowel sounds present.  EXTREMITIES: No  edema b/l.    NEUROLOGIC: dysarthria, right side hemiparesis  patient is alert and awake SKIN: per RN  LABORATORY PANEL:  CBC Recent Labs  Lab 06/01/23 0710  WBC 7.3  HGB 10.2*  HCT 31.9*  PLT 294    Chemistries  Recent Labs  Lab 05/31/23 1131 06/01/23 0710  NA 137 139  K 3.6 4.0  CL 109 109  CO2 21* 22  GLUCOSE 98 94  BUN 20 21  CREATININE 1.32* 1.17  CALCIUM 9.3 8.9  AST 19  --   ALT 12  --   ALKPHOS 68  --   BILITOT 0.5  --    Cardiac Enzymes No results for input(s): "TROPONINI" in the last 168 hours. RADIOLOGY:  CT HEAD WO CONTRAST ( )  Result Date: 06/01/2023 CLINICAL DATA:  Follow-up.  Worsening weakness. EXAM: CT HEAD WITHOUT CONTRAST TECHNIQUE: Contiguous axial images were obtained from the base of the skull through the vertex without intravenous contrast. RADIATION DOSE REDUCTION: This exam was performed according to the departmental dose-optimization program which includes automated exposure control, adjustment of the mA and/or kV according to patient size and/or use of iterative reconstruction technique.  COMPARISON:  05/31/2023. FINDINGS: Brain: Well-defined hypoattenuation focus in the left centrum semiovale corresponds to a small focus of increased diffusion signal on the previous day's exam. No new infarcts since the previous exams. No parenchymal masses or mass effect. Old left frontal lobe infarct. Several old left basal ganglia and deep white matter lacunar infarcts. Small old right parietal lobe infarct. No hydrocephalus. Ventricles and sulci enlarged reflecting moderate atrophy. No extra-axial masses or abnormal fluid collections. No intracranial hemorrhage. Vascular: No hyperdense vessel or unexpected calcification. Skull: Normal. Negative for fracture or focal lesion. Sinuses/Orbits: Globes and orbits are unremarkable. Sinuses are clear. Other: None. IMPRESSION: 1. No interval change from the exam performed on 05/31/2023. No evidence of a new infarct or of infarct extension. No intracranial hemorrhage. Electronically Signed   By: Amie Portland M.D.   On: 06/01/2023 10:28   MR BRAIN WO CONTRAST  Result Date: 05/31/2023 CLINICAL DATA:  Difficulty standing up EXAM: MRI HEAD WITHOUT CONTRAST TECHNIQUE: Multiplanar, multiecho pulse sequences of the brain and surrounding structures were obtained without intravenous contrast. COMPARISON:  Same-day CT/CTA head and neck, brain MRI 03/27/2023 FINDINGS: Brain: There is a small focus of elevated DWI signal in the left corona radiata surrounding a cystic space on the T2 sequence. The cystic space is new since the brain MRI from 03/27/2023. Findings favored to reflect a late subacute to early chronic lacunar infarct. There is an additional punctate focus of diffusion restriction in the left internal capsule/lentiform nucleus (  5-23). There is no other evidence of acute infarct. There is no acute intracranial hemorrhage or extra-axial fluid collection. There is parenchymal volume loss with prominence of the ventricular system and extra-axial CSF spaces. There is  unchanged moderate background chronic small-vessel ischemic change. Remote cortical infarcts in the left frontal and right parietal lobes are again seen. A small remote infarct in the pons is unchanged. There is remote hemorrhage in the left basal ganglia. Numerous additional punctate chronic microhemorrhages are also noted, possibly hypertensive in etiology. The pituitary and suprasellar region are normal. There is no mass lesion or abnormal enhancement. There is no mass effect or midline shift. Vascular: Normal flow voids. Skull and upper cervical spine: Normal marrow signal. Sinuses/Orbits: The paranasal sinuses are clear. The globes and orbits are unremarkable. Other: The mastoid air cells and middle ear cavities are clear. A right nasopharyngeal retention cyst is unchanged. IMPRESSION: 1. Punctate acute to early subacute infarct in the left internal capsule/basal ganglia, and probable late subacute to early chronic infarct in the left corona radiata. 2. Additional remote infarcts, background chronic small-vessel ischemic change, and parenchymal volume loss are not significantly changed. Electronically Signed   By: Lesia Hausen M.D.   On: 05/31/2023 16:28   CT RENAL STONE STUDY  Result Date: 05/31/2023 CLINICAL DATA:  Abdominal/flank pain, stone suspected EXAM: CT ABDOMEN AND PELVIS WITHOUT CONTRAST TECHNIQUE: Multidetector CT imaging of the abdomen and pelvis was performed following the standard protocol without IV contrast. RADIATION DOSE REDUCTION: This exam was performed according to the departmental dose-optimization program which includes automated exposure control, adjustment of the mA and/or kV according to patient size and/or use of iterative reconstruction technique. COMPARISON:  08/24/2022 CT, 04/01/2023 ultrasound FINDINGS: Lower chest: Mild bibasilar subsegmental atelectasis. Heart size within normal limits. Aortic and coronary artery atherosclerosis. Moderate hiatal hernia. Hepatobiliary: No  focal liver abnormality identified on unenhanced images. Cholelithiasis. No pericholecystic inflammatory changes by CT. No biliary dilatation. Pancreas: Unremarkable. No pancreatic ductal dilatation or surrounding inflammatory changes. Spleen: Normal in size without focal abnormality. Adrenals/Urinary Tract: Unremarkable adrenal glands. Stable bilateral renal cysts, better characterized by recent ultrasound and which do not require further follow-up imaging. There is excreted contrast within bilateral renal collecting systems limiting assessment for the possibility of nonobstructing stones. No hydronephrosis. Excreted contrast within the bladder. Urinary bladder wall appears somewhat thickened and trabeculated. Stomach/Bowel: Moderate hiatal hernia. Stomach is otherwise within normal limits. Appendix appears normal. No evidence of bowel wall thickening, distention, or inflammatory changes. Vascular/Lymphatic: Aortic atherosclerosis. No enlarged abdominal or pelvic lymph nodes. Reproductive: Mild prostatomegaly. Other: No free fluid. No abdominopelvic fluid collection. No pneumoperitoneum. No abdominal wall hernia. Musculoskeletal: No acute or significant osseous findings. Chronic superior endplate compression fracture of L1. Multiple chronic right-sided rib fractures. IMPRESSION: 1. No acute abdominopelvic findings. 2. Excreted contrast within bilateral renal collecting systems limiting assessment for the possibility of nonobstructing stones. No hydronephrosis. 3. Urinary bladder wall appears somewhat thickened and trabeculated, which may be related to chronic bladder outlet obstruction in the setting of mild prostatomegaly. Correlate with urinalysis to exclude cystitis. 4. Cholelithiasis. 5. Moderate hiatal hernia. 6. Aortic atherosclerosis (ICD10-I70.0). Electronically Signed   By: Duanne Guess D.O.   On: 05/31/2023 16:21   CT ANGIO HEAD NECK W WO CM  Result Date: 05/31/2023 CLINICAL DATA:  Recently  admitted for stroke and carotid endarterectomy. Presents with falling to the floor, unable to get up. EXAM: CT ANGIOGRAPHY HEAD AND NECK WITH AND WITHOUT CONTRAST TECHNIQUE: Multidetector CT imaging of  the head and neck was performed using the standard protocol during bolus administration of intravenous contrast. Multiplanar CT image reconstructions and MIPs were obtained to evaluate the vascular anatomy. Carotid stenosis measurements (when applicable) are obtained utilizing NASCET criteria, using the distal internal carotid diameter as the denominator. RADIATION DOSE REDUCTION: This exam was performed according to the departmental dose-optimization program which includes automated exposure control, adjustment of the mA and/or kV according to patient size and/or use of iterative reconstruction technique. CONTRAST:  75mL OMNIPAQUE IOHEXOL 350 MG/ML SOLN COMPARISON:  CT head 03/27/2023, CTA head/neck 02/18/2023, carotid Doppler 03/27/2023 FINDINGS: CT HEAD FINDINGS Brain: There is no evidence of acute intracranial hemorrhage, extra-axial fluid collection, or acute territorial infarct. Background parenchymal volume loss with prominence of the ventricular system and extra-axial CSF spaces is stable. The ventricles are stable in size. Encephalomalacia in the left frontal and right parietal lobes and a small remote infarct in the left basal ganglia/corona radiata are unchanged. Additional confluent hypodensity in the supratentorial white matter consistent with underlying chronic small-vessel ischemic change is stable. The pituitary and suprasellar region are normal. There is no mass lesion. There is no mass effect or midline shift. Vascular: See below. Skull: Normal. Negative for fracture or focal lesion. Sinuses/Orbits: The paranasal sinuses are clear. The globes and orbits are unremarkable. Other: The mastoid air cells and middle ear cavities are clear. Review of the MIP images confirms the above findings CTA NECK  FINDINGS Aortic arch: Is calcified plaque in the imaged aortic arch. The origins of the major branch vessels are patent. The subclavian arteries are patent to the level imaged. Right carotid system: The right common, internal, and external carotid arteries are patent with scattered calcified plaque throughout but no hemodynamically significant stenosis or occlusion. There is no evidence of dissection or aneurysm. Left carotid system: The left common carotid artery is patent with scattered calcified plaque. There has been interval left carotid endarterectomy since the prior CTA from 02/18/2023. The internal carotid artery is widely patent. There is a small focal dissection in the external carotid artery resulting in mild luminal narrowing ([phone]). There is no evidence of dissection of the internal carotid artery. There is no evidence of aneurysm. Vertebral arteries: Moderate stenosis of the origin of the right vertebral artery is unchanged. The vertebral arteries are otherwise patent, without other hemodynamically significant stenosis or occlusion. There is no evidence of dissection or aneurysm. Skeleton: There is no acute osseous abnormality or suspicious osseous lesion. Multilevel degenerative change of the cervical spine is again seen. There is no visible canal hematoma. Other neck: The soft tissues of the neck are unremarkable. Upper chest: The imaged lung apices are clear. Review of the MIP images confirms the above findings CTA HEAD FINDINGS Anterior circulation: There is calcified plaque in the intracranial ICAs resulting in mild-to-moderate stenosis bilaterally. The bilateral MCAs and ACAS are patent, without proximal stenosis or occlusion. There is no evidence of dissection or aneurysm. Posterior circulation: The right vertebral artery is diminutive particularly after the PICA origin, but remains patent. This is unchanged. There is calcified plaque at the left V3/V4 junction and proximal V4 segment resulting  in up to mild-to-moderate stenosis, unchanged. The distal V4 segment is patent. The basilar artery is patent. The major cerebellar arteries appear patent. The bilateral PCAs are patent, without proximal high-grade stenosis or occlusion. A left posterior communicating artery is identified. There is no aneurysm or AVM. Venous sinuses: Suboptimally evaluated due to bolus timing. Anatomic variants: None. Review of the  MIP images confirms the above findings IMPRESSION: 1. No acute intracranial pathology on initial noncontrast head CT. 2. Interval left carotid endarterectomy with widely patent common and internal carotid arteries. There is a small focal dissection in the proximal external carotid artery resulting in mild luminal narrowing. 3. Unchanged calcified plaque in the right carotid system without hemodynamically significant stenosis or occlusion. 4. Unchanged moderate stenosis of the right vertebral artery origin. 5. Unchanged calcified plaque in the intracranial ICAs and V4 segments with a diminutive right V4 segment but otherwise no high-grade stenosis or occlusion. Electronically Signed   By: Lesia Hausen M.D.   On: 05/31/2023 12:24   DG Chest Portable 1 View  Result Date: 05/31/2023 CLINICAL DATA:  Weakness EXAM: PORTABLE CHEST 1 VIEW COMPARISON:  03/31/2023 FINDINGS: Heart size within normal limits. Aortic atherosclerosis. No focal airspace consolidation, pleural effusion, or pneumothorax. Multiple old healed bilateral rib fractures. IMPRESSION: No active cardiopulmonary disease. Electronically Signed   By: Duanne Guess D.O.   On: 05/31/2023 12:07    Assessment and Plan  Richard Carter is a 82 y.o. male with medical history significant of recurrent CVA with most recent in May 2024 s/p endarterectomy, A-fib on Eliquis, hypertension, hyperlipidemia, mixed vascular and Alzheimer's dementia, seizure disorder, alcohol use disorder in remission, who presents to the ED due to facial droop, dysarthria  and right leg weakness.   Acute CVA (cerebrovascular accident) [facility]), Recurrent --Patient has a history of recurrent CVA s/p recent endarterectomy (May 2024) presenting with slurred speech, right facial droop and right leg weakness.  -- Dysarthria has persisted with subtle facial droop but weakness has improved.   --CTA with no evidence of LVO; small dissection noted, however vascular surgery reported it is not concerning.  - MRI brain Punctate acute to early subacute infarct in the left internal capsule/basal ganglia, and probable late subacute to early chronic infarct in the left corona radiata. Additional remote infarcts, background chronic small-vessel ischemic change, and parenchymal volume loss are not significantly changed. -  neurology input noted--cont eliquis, PT/OT/ST - Allow for permissive HTN (systolic < 220 and diastolic < 120) until MRI results are returned - Statin    Recent Left CEA in may 2024 by Dr Gilda Crease --cont eliquis and statin   UTI (urinary tract infection) ?BPH (changes of Bladder outlet obstruction noted on CT abd) --Patient has a history of recurrent UTI presenting with nausea, vomiting, and painful urination.    - Renal stones CT shows changes of bladder outlet obstruction - Continue ceftriaxone 1 g daily - Urine culture pending --start Flomax   AKI (acute kidney injury) (HCC) Unclear etiology of AKI given symptom onset has been rapid.    - Gentle IV fluids - CT renal stone no hydronephrosis - Avoid nephrotoxic agents - Daily BMP - Strict in and out --creat back to baseline   AF (paroxysmal atrial fibrillation) (HCC) - on po eliquis - No rate controlling agents given slow ventricular rate   HTN (hypertension) - Hold home antihypertensives  fro 24 hours --takes amlodipine at home   Seizures [facility]) --resume Keppra per neurology --no EEG   Dementia (HCC) - Delirium precautions - Continue home trazodone   Advance Care Planning:   Code Status:  DNR/DNI.  Family communication :none today Consults :Neurology  DVT Prophylaxis :Eliquis Level of care: Telemetry Medical Status is: recurrent CVA, UTI    TOTAL TIME TAKING CARE OF THIS PATIENT: 35 minutes.  >50% time spent on counselling and coordination of care  Note:  This dictation was prepared with Dragon dictation along with smaller phrase technology. Any transcriptional errors that result from this process are unintentional.  Enedina Finner M.D    [facility]   CC: Primary care physician; Marisue Ivan, MD

## 2023-06-01 NOTE — Evaluation (Signed)
Speech Language Pathology Evaluation Patient Details Name: Richard Carter MRN: [id number] DOB: [birth year] Today's Date: 06/01/2023 Time: 4098-1191 SLP Time Calculation (min) (ACUTE ONLY): 15 min  Problem List:  Patient Active Problem List   Diagnosis Date Noted   History of alcohol use disorder 05/31/2023   Goals of care, counseling/discussion 04/03/2023   Malnutrition of moderate degree 04/03/2023   Stroke (HCC) 03/27/2023   Stenosis of left carotid artery 02/20/2023   Altered mental status 02/20/2023   Acute stroke due to ischemia (HCC) 02/17/2023   Stroke-like symptom 02/16/2023   Leukocytosis 02/16/2023   Anemia of chronic disease 07/22/2022   Protein-calorie malnutrition, severe 05/28/2022   Dysphagia due to recent cerebrovascular accident 05/28/2022   Acute metabolic encephalopathy 05/27/2022   History of stroke 05/27/2022   Aspiration pneumonia (HCC) 04/23/2022   Nicotine dependence    Elevated troponin    Acute CVA (cerebrovascular accident) (HCC) 05/07/2021   Non compliance w medication regimen 05/07/2021   Dementia (HCC) 05/07/2021   AF (paroxysmal atrial fibrillation) (HCC) 05/07/2021   HTN (hypertension) 11/29/2020   Alcohol abuse 11/29/2020   AKI (acute kidney injury) (HCC) 11/29/2020   Atrial flutter with rapid ventricular response (HCC) 11/29/2020   Seizures (HCC) 02/22/2020   Hypomagnesemia 10/29/2019   Pubic ramus fracture (HCC) 07/29/2019   Acetabular fracture (HCC) 07/29/2019   HLD (hyperlipidemia) 07/29/2019   GERD (gastroesophageal reflux disease) 07/29/2019   UTI (urinary tract infection) 07/29/2019   Vitamin D deficiency 09/29/2015   Past Medical History:  Past Medical History:  Diagnosis Date   Alcohol abuse    Dementia (HCC)    GERD (gastroesophageal reflux disease)    HLD (hyperlipidemia)    HTN (hypertension)    Seizures (HCC)    Stroke [facility])    Past Surgical History:  Past Surgical History:  Procedure Laterality Date    ENDARTERECTOMY Left 04/02/2023   Procedure: ENDARTERECTOMY CAROTID;  Surgeon: Renford Dills, MD;  Location: [facility] ORS;  Service: Vascular;  Laterality: Left;   skin cyst removal     HPI:  Pt admitted to [facility] on 05/31/23 under observation for c/o stroke like symptoms including: facial droop, dysarthria, and RLE weakness. Pt outside the window for tPA. Imaging significant for: punctate acute to early subacute infarct in the L internal capsule/basal ganglia, and probable late subacute to early chronic infarct in the L corona radiata. Significant PMH includes: hx CVA, carotid endarterectomy (03/2023), Afib on Eliquis, HTN, hLD, mixed vascular and Alzheimer's dementia, seizure disorder, alcohol use disorder (in remission).   Assessment / Plan / Recommendation Clinical Impression  Pt seen for speech/language evaluation. Assessment completed via informal means and portions of Western Aphasia Battery Revised (Bedside Record Form). Pt presents with s/sx moderate-severe dysarthria c/b slow rate of speech, hoarse vocal quality, reduced vocal loudness, imprecise articulation, and reduced speech intelligibility (~50% to an unfamiliar listener). Pt with s/sx aphasia. Further assessment needed to determine subtype. Pt's verbal expression notable for mild anomia with phonemic and semantic paraphasias. Impaired repetition noted. Pt with receptive language deficits affecting auditory comprehension for complex yes/no question, 2-step directives, and likely conversation.  Assessment limited due to visual deficits. Glasses not present. Pt able to adequate see pictured stimuli for spontaneous speech sample. Difficult to discern pt's baseline level of functioning as similar speech/language deficits were noted on speech/language evaluation completed in May 2023. Cognitive assessment deferred given hx dementia. No family present to provide collateral.  Recommend continued SLP services acute and post-acute for functional  communication. Anticipate need  for frequent/constant supervision and assistance at d/c.    SLP Assessment  SLP Recommendation/Assessment: Patient needs continued Speech Lanaguage Pathology Services SLP Visit Diagnosis: Dysphagia, oropharyngeal phase (R13.12)    Recommendations for follow up therapy are one component of a multi-disciplinary discharge planning process, led by the attending physician.  Recommendations may be updated based on patient status, additional functional criteria and insurance authorization.    Follow Up Recommendations  Skilled nursing-short term rehab (<3 hours/day)    Assistance Recommended at Discharge  Frequent or constant Supervision/Assistance  Functional Status Assessment Patient has had a recent decline in their functional status and demonstrates the ability to make significant improvements in function in a reasonable and predictable amount of time.  Frequency and Duration min 2x/week  2 weeks      SLP Evaluation Cognition  Overall Cognitive Status: Difficult to assess (hx of cognitive impairment, including dementia; no family present to provide collateral) Arousal/Alertness: Awake/alert Orientation Level: Oriented to situation       Comprehension  Auditory Comprehension Overall Auditory Comprehension: Impaired Yes/No Questions: Impaired Complex Questions: 50-74% accurate Commands: Impaired Two Step Basic Commands: 50-74% accurate Complex Commands: 50-74% accurate EffectiveTechniques: Extra processing time;Repetition;Visual/Gestural cues Visual Recognition/Discrimination Discrimination: Not tested Reading Comprehension Reading Status: Not tested    Expression Expression Primary Mode of Expression: Verbal Verbal Expression Overall Verbal Expression: Impaired Initiation: Impaired Automatic Speech: Name;Social Response;Counting (WFL) Level of Generative/Spontaneous Verbalization: Sentence;Phrase Repetition: Impaired Level of Impairment:  Sentence level;Phrase level Naming: Impairment Responsive: 76-100% accurate Confrontation: Impaired Verbal Errors: Phonemic paraphasias;Not aware of errors Pragmatics: Impairment Impairments: Topic maintenance Interfering Components: Premorbid deficit;Speech intelligibility (suspect cognitive deficits in setting of dementia) Written Expression Dominant Hand: Right Written Expression: Not tested   Oral / Motor  Oral Motor/Sensory Function Overall Oral Motor/Sensory Function: Mild impairment Facial ROM: Reduced right Lingual ROM: Reduced right;Reduced left Motor Speech Overall Motor Speech: Impaired Respiration: Impaired Phonation: Hoarse;Low vocal intensity Resonance: Within functional limits Articulation: Impaired Intelligibility: Intelligibility reduced Word: 50-74% accurate Phrase: 50-74% accurate Sentence: 50-74% accurate Conversation: 50-74% accurate Motor Planning: Witnin functional limits           Clyde Canterbury, M.S., CCC-SLP Speech-Language Pathologist [facility] [phone] (ASCOM)  Woodroe Chen 06/01/2023, 1:33 PM

## 2023-06-01 NOTE — Evaluation (Signed)
Physical Therapy Evaluation Patient Details Name: Richard Carter MRN: [id number] DOB: 12-02-1940 Today's Date: 06/01/2023  History of Present Illness  Pt admitted to [facility] on 05/31/23 under observation for c/o stroke like symptoms including: facial droop, dysarthria, and RLE weakness. Pt outside the window for tPA. Imaging significant for: punctate acute to early subacute infarct in the L internal capsule/basal ganglia, and probable late subacute to early chronic infarct in the L corona radiata. Significant PMH includes: hx CVA, carotid endarterectomy (03/2023), Afib on Eliquis, HTN, hLD, mixed vascular and Alzheimer's dementia, seizure disorder, alcohol use disorder (in remission).   Clinical Impression  Pt is a 82 year old M admitted to hospital on 05/31/23 for stroke-like symptoms. Pt questionable historian of health due to baseline Alzheimer's. Per pt report and chart review, pt requires assistance for bathing, dressing, and IADL's. He uses a RW for ambulation within the home and sleeps on the sofa.  Pt presents with R sided weakness, decreased gross balance, decreased activity tolerance, impaired safety awareness, impaired motor control and coordination, impaired proprioceptive/kinesthetic awareness, resulting in impaired functional mobility. Due to deficits, pt required supervision for bed mobility, min-mod assist for transfers with RW, and mod assist to ambulate 38ft x2 from [facility] for toileting. Due to impaired safety awareness, pt currently requiring max multimodal cues for all upright functional mobility to ensure safety and reduce risk of falls. Requiring increased assist for hygiene/self care tasks with toileting due to impaired balance.  Deficits limit the pt's ability to safely and independently perform ADL's, transfer, and ambulate. Pt will benefit from acute skilled PT services to address deficits for return to baseline function.          Assistance Recommended at Discharge Frequent or  constant Supervision/Assistance  If plan is discharge home, recommend the following:  Can travel by private vehicle  A lot of help with walking and/or transfers;A little help with bathing/dressing/bathroom;Assistance with cooking/housework;Direct supervision/assist for medications management;Direct supervision/assist for financial management;Assist for transportation;Help with stairs or ramp for entrance   Yes    Equipment Recommendations  (defer to post acute)  Recommendations for Other Services  OT consult    Functional Status Assessment Patient has had a recent decline in their functional status and demonstrates the ability to make significant improvements in function in a reasonable and predictable amount of time.     Precautions / Restrictions Precautions Precautions: Fall Precaution Comments: Aspiration, DNR      Mobility  Bed Mobility Overal bed mobility: Needs Assistance Bed Mobility: Supine to Sit, Sit to Supine     Supine to sit: Supervision Sit to supine: Min guard   General bed mobility comments: HOB elevated, use of bed rails for support, increased time/effort    Transfers Overall transfer level: Needs assistance   Transfers: Sit to/from Stand Sit to Stand: Min assist, Mod assist           General transfer comment: min-mod assist for power to stand from EOB and BSC with RW; max multimodal cues for safety, sequencing, and hand placement.    Ambulation/Gait   Gait Distance (Feet): 4 Feet (7ft x2)           General Gait Details: Mod assist to ambulate short distance from EOB<>recliner with RW. Demonstrates wide BOS, slowed cadence, decreased step length/foot clearance bil, and decreased RW proximity/negotiation. Multimodal cues for safety, sequencing, and RW proximity/negotiation.     Balance Overall balance assessment: Needs assistance   Sitting balance-Leahy Scale: Good Sitting balance - Comments: able  to scoot anteriorly towards EOB without  LOB, demonstrating appropriate weight shift   Standing balance support: Bilateral upper extremity supported, During functional activity, Reliant on assistive device for balance Standing balance-Leahy Scale: Poor Standing balance comment: mod assist for standing balance in RW                             Pertinent Vitals/Pain Pain Assessment Pain Assessment: No/denies pain    Home Living Family/patient expects to be discharged to:: Private residence Living Arrangements: Alone Available Help at Discharge: Available 24 hours/day;Neighbor Type of Home: Mobile home Home Access: Ramped entrance       Home Layout: One level Home Equipment: Grab bars - tub/shower;Shower seat;Grab bars - Curator (2 wheels);BSC/3in1;Wheelchair Financial trader (4 wheels) Additional Comments: Pt typically sleeps on sofa, per caregiver report    Prior Function Prior Level of Function : Needs assist;History of Falls (last six months)             Mobility Comments: Per chart review and previous hospital admission "Modified independent ambulating with RW." ADLs Comments: Per chart review and previous hospital admission "Neighbor (caregiver) reports he shares POA with pt's son and assists pt with ADLs/IADLs (dressing, bathing, medications, meals, cleaning, etc)--assists pt 7 days a week and assists 12 plus hours a day. States that pt typically does toileting, grooming, and eating independently. information is per prior notes"     Hand Dominance   Dominant Hand: Right    Extremity/Trunk Assessment   Upper Extremity Assessment Upper Extremity Assessment: Defer to OT evaluation;RUE deficits/detail (LUE grossly 4/5, sensation intact, bradykinetic coordination) RUE Deficits / Details: Shoulder flexion 3-/5; otherwise grossly 3/5. Limited shoulder flexion AROM due to weakness. Impaired coordination. Superficial sensation intact, proprioception/kinesthesia impaired    Lower Extremity  Assessment Lower Extremity Assessment: RLE deficits/detail RLE Deficits / Details: grossly 4/5, sensation intact, coordination impaired, proprioception/kinesthesia impaired       Communication   Communication: Expressive difficulties  Cognition Arousal/Alertness: Awake/alert Behavior During Therapy: WFL for tasks assessed/performed Overall Cognitive Status: History of cognitive impairments - at baseline                                 General Comments: A&O x2; unable to state time/situation. Follows one-step commands with incresed time for processing. Poor safety awareness.           Exercises Other Exercises Other Exercises: Participates in: bed mobility, transfers, minimal gait, and toileting. Increased assist secondary to poor safety awareness. Other Exercises: Pt educated re: PT role/POC, DC recommendations, call for help, safety with functional mobility.   Assessment/Plan    PT Assessment Patient needs continued PT services  PT Problem List Decreased strength;Decreased range of motion;Decreased activity tolerance;Decreased balance;Decreased mobility;Decreased coordination;Decreased cognition;Decreased safety awareness;Impaired sensation       PT Treatment Interventions DME instruction;Gait training;Functional mobility training;Therapeutic activities;Therapeutic exercise;Balance training;Neuromuscular re-education    PT Goals (Current goals can be found in the Care Plan section)  Acute Rehab PT Goals Patient Stated Goal: "go home" PT Goal Formulation: With patient Time For Goal Achievement: 06/15/23 Potential to Achieve Goals: Fair    Frequency Min 2X/week        AM-PAC PT "6 Clicks" Mobility  Outcome Measure Help needed turning from your back to your side while in a flat bed without using bedrails?: A Little Help needed moving from lying on your back  to sitting on the side of a flat bed without using bedrails?: A Little Help needed moving to and from  a bed to a chair (including a wheelchair)?: A Lot Help needed standing up from a chair using your arms (e.g., wheelchair or bedside chair)?: A Little Help needed to walk in hospital room?: A Lot Help needed climbing 3-5 steps with a railing? : A Lot 6 Click Score: 15    End of Session Equipment Utilized During Treatment: Gait belt Activity Tolerance: Patient tolerated treatment well Patient left: in bed;with call bell/phone within reach;with bed alarm set Nurse Communication: Mobility status PT Visit Diagnosis: Unsteadiness on feet (R26.81);Other abnormalities of gait and mobility (R26.89);Muscle weakness (generalized) (M62.81);History of falling (Z91.81);Difficulty in walking, not elsewhere classified (R26.2);Hemiplegia and hemiparesis Hemiplegia - Right/Left: Right    Time: 1610-9604 PT Time Calculation (min) (ACUTE ONLY): 18 min   Charges:   PT Evaluation $PT Eval Low Complexity: 1 Low PT Treatments $Therapeutic Activity: 8-22 mins PT General Charges $$ ACUTE PT VISIT: 1 Visit     Vira Blanco, PT, DPT 12:01 PM,06/01/23 Physical Therapist - [facility]

## 2023-06-02 DIAGNOSIS — I639 Cerebral infarction, unspecified: Secondary | ICD-10-CM | POA: Diagnosis not present

## 2023-06-02 LAB — URINE CULTURE: Culture: >=100000 — AB

## 2023-06-02 MED ORDER — AMLODIPINE BESYLATE 10 MG PO TABS
10.0000 mg | ORAL_TABLET | Freq: Every day | ORAL | Status: DC
  Administered 2023-06-02 – 2023-06-11 (×10): 10 mg via ORAL
  Filled 2023-06-02 (×10): qty 1

## 2023-06-02 NOTE — Progress Notes (Signed)
SLP Cancellation Note  Patient Details Name: Richard Carter MRN: [id number] DOB: 1940/12/16   Cancelled treatment:       Reason Eval/Treat Not Completed: Patient at procedure or test/unavailable (Pt being bathed by nursing. Discussed pt with Richard Carter, pt's primary nurses, who noted "some coughing" with POs. This was noted on previous admissions as well. Will continue efforts as appropraite.)  Richard Carter, M.S., CCC-SLP Speech-Language Pathologist [facility] [phone] Richard Carter)  Richard Carter 06/02/2023, 11:08 AM

## 2023-06-02 NOTE — Progress Notes (Addendum)
Progress Note    Richard Carter  WGN:[id number] DOB: 21-Jul-1941  DOA: 05/31/2023 PCP: Marisue Ivan, MD      Brief Narrative:    Medical records reviewed and are as summarized below:  Richard Carter is a 82 y.o. male  with medical history significant of recurrent CVA with most recent in May 2024 s/p endarterectomy, A-fib on Eliquis, hypertension, hyperlipidemia, mixed vascular and Alzheimer's dementia, seizure disorder, alcohol use disorder in remission, who presented to the hospital with facial droop, slurred speech and right leg weakness.    Assessment/Plan:   Principal Problem:   Acute CVA (cerebrovascular accident) (HCC) Active Problems:   UTI (urinary tract infection)   AKI (acute kidney injury) (HCC)   AF (paroxysmal atrial fibrillation) (HCC)   HTN (hypertension)   Seizures (HCC)   Dementia (HCC)    Body mass index is 25.84 kg/m.   Acute stroke, recurrent: Speech is still slurred with right leg weakness has improved.  Continue Eliquis and Lipitor.  Continue PT, OT and ST. Recent stroke s/p left carotid endarterectomy on 04/02/2023 Dysphagia: Speech therapist recommended dysphagia 1 diet and nectar thick liquids. MRI brain: Punctate acute to early subacute infarct in the left internal capsule/basal ganglia, and probable late subacute to early chronic infarct in the left corona radiata   Acute UTI: Urine culture showed E. coli.  Continue IV ceftriaxone.  Follow-up sensitivity report. BPH: Continue Flomax.  Mild prostatomegaly on CT renal stone study.   Hypertension: BP is elevated.  Restart amlodipine Sinus bradycardia: Asymptomatic  AKI: Improved   Seizure disorder: Continue Keppra   Dementia: Continue supportive care  Diet Order             DIET - DYS 1 Room service appropriate? Yes; Fluid consistency: Nectar Thick  Diet effective now                             Consultants: Neurologist  Procedures: None    Medications:    amLODipine  10 mg Oral Daily   apixaban  5 mg Oral BID   atorvastatin  20 mg Oral QPM   levETIRAcetam  500 mg Oral BID   pantoprazole  40 mg Oral Daily   tamsulosin  0.4 mg Oral QPC breakfast   traZODone  100 mg Oral QHS   Continuous Infusions:  cefTRIAXone (ROCEPHIN)  IV Stopped (06/01/23 1448)     Anti-infectives (From admission, onward)    Start     Dose/Rate Route Frequency Ordered Stop   06/01/23 1300  cefTRIAXone (ROCEPHIN) 1 g in sodium chloride 0.9 % 100 mL IVPB        1 g 200 mL/hr over 30 Minutes Intravenous Every 24 hours 05/31/23 1531 06/05/23 1259   05/31/23 1215  cefTRIAXone (ROCEPHIN) 2 g in sodium chloride 0.9 % 100 mL IVPB        2 g 200 mL/hr over 30 Minutes Intravenous  Once 05/31/23 1210 05/31/23 1603              Family Communication/Anticipated D/C date and plan/Code Status   DVT prophylaxis:  apixaban (ELIQUIS) tablet 5 mg     Code Status: DNR  Family Communication: Plan discussed with [name] (POA) over the phone Disposition Plan: Plan to discharge to SNF   Status is: Inpatient Remains inpatient appropriate because: Acute stroke, awaiting placement to SNF       Subjective:   Interval events noted.  He has no complaints but his speech is still slurred  Objective:    Vitals:   06/02/23 0103 06/02/23 0523 06/02/23 0800 06/02/23 0830  BP: (!) 177/78 (!) 184/71 (!) 178/64 (!) 157/59  Pulse: (!) 41 (!) 50 (!) 58 (!) 53  Resp: 18 18 18    Temp: (!) 97.5 F (36.4 C) (!) 97.4 F (36.3 C) 97.8 F (36.6 C)   TempSrc: Oral Oral Oral   SpO2: 100% 99% 99% 99%  Weight:      Height:       Orthostatic VS for the past 24 hrs:  BP- Lying Pulse- Lying BP- Sitting Pulse- Sitting BP- Standing at 0 minutes Pulse- Standing at 0 minutes  06/01/23 1500 140/74 51 (!) 140/97 69 (!) 152/98 86     Intake/Output Summary (Last 24 hours) at 06/02/2023  1207 Last data filed at 06/02/2023 0121 Gross per 24 hour  Intake 250.5 ml  Output 450 ml  Net -199.5 ml   Filed Weights   05/31/23 1034  Weight: 74.8 kg    Exam:  GEN: NAD SKIN: Warm and dry EYES: No pallor or icterus ENT: MMM CV: RRR PULM: CTA B ABD: soft, ND, NT, +BS CNS: AAO x 3, slurred speech, power RUE-3/5, RLE-4/5 EXT: No edema or tenderness        Data Reviewed:   I have personally reviewed following labs and imaging studies:  Labs: Labs show the following:   Basic Metabolic Panel: Recent Labs  Lab 05/31/23 1131 06/01/23 0710  NA 137 139  K 3.6 4.0  CL 109 109  CO2 21* 22  GLUCOSE 98 94  BUN 20 21  CREATININE 1.32* 1.17  CALCIUM 9.3 8.9   GFR Estimated Creatinine Clearance: 44.7 mL/min (by C-G formula based on SCr of 1.17 mg/dL). Liver Function Tests: Recent Labs  Lab 05/31/23 1131  AST 19  ALT 12  ALKPHOS 68  BILITOT 0.5  PROT 7.1  ALBUMIN 3.6   No results for input(s): "LIPASE", "AMYLASE" in the last 168 hours. No results for input(s): "AMMONIA" in the last 168 hours. Coagulation profile No results for input(s): "INR", "PROTIME" in the last 168 hours.  CBC: Recent Labs  Lab 05/31/23 1131 06/01/23 0710  WBC 7.9 7.3  NEUTROABS 5.3 5.4  HGB 11.0* 10.2*  HCT 34.5* 31.9*  MCV 89.6 87.9  PLT 301 294   Cardiac Enzymes: No results for input(s): "CKTOTAL", "CKMB", "CKMBINDEX", "TROPONINI" in the last 168 hours. BNP (last 3 results) No results for input(s): "PROBNP" in the last 8760 hours. CBG: Recent Labs  Lab 05/31/23 1033  GLUCAP 92   D-Dimer: No results for input(s): "DDIMER" in the last 72 hours. Hgb A1c: No results for input(s): "HGBA1C" in the last 72 hours. Lipid Profile: No results for input(s): "CHOL", "HDL", "LDLCALC", "TRIG", "CHOLHDL", "LDLDIRECT" in the last 72 hours. Thyroid function studies: No results for input(s): "TSH", "T4TOTAL", "T3FREE", "THYROIDAB" in the last 72 hours.  Invalid input(s):  "FREET3" Anemia work up: No results for input(s): "VITAMINB12", "FOLATE", "FERRITIN", "TIBC", "IRON", "RETICCTPCT" in the last 72 hours. Sepsis Labs: Recent Labs  Lab 05/31/23 1131 06/01/23 0710  WBC 7.9 7.3    Microbiology Recent Results (from the past 240 hour(s))  Urine Culture     Status: Abnormal (Preliminary result)   Collection Time: 05/31/23 11:58 AM   Specimen: Urine, Clean Catch  Result Value Ref Range Status   Specimen Description   Final    URINE, CLEAN CATCH Performed at [facility]  Lab, [address], [place], [place] [address]    Special Requests   Final    NONE Performed at [facility], [address] [place], [place] [address]    Culture (A)  Final    >=100,000 COLONIES/mL ESCHERICHIA COLI SUSCEPTIBILITIES TO FOLLOW Performed at [facility], [address], [place], [place] [address]    Report Status PENDING  Incomplete    Procedures and diagnostic studies:  CT HEAD WO CONTRAST ( )  Result Date: 06/01/2023 CLINICAL DATA:  Follow-up.  Worsening weakness. EXAM: CT HEAD WITHOUT CONTRAST TECHNIQUE: Contiguous axial images were obtained from the base of the skull through the vertex without intravenous contrast. RADIATION DOSE REDUCTION: This exam was performed according to the departmental dose-optimization program which includes automated exposure control, adjustment of the mA and/or kV according to patient size and/or use of iterative reconstruction technique. COMPARISON:  05/31/2023. FINDINGS: Brain: Well-defined hypoattenuation focus in the left centrum semiovale corresponds to a small focus of increased diffusion signal on the previous day's exam. No new infarcts since the previous exams. No parenchymal masses or mass effect. Old left frontal lobe infarct. Several old left basal ganglia and deep white matter lacunar infarcts. Small old right parietal lobe infarct. No hydrocephalus. Ventricles and sulci enlarged reflecting moderate  atrophy. No extra-axial masses or abnormal fluid collections. No intracranial hemorrhage. Vascular: No hyperdense vessel or unexpected calcification. Skull: Normal. Negative for fracture or focal lesion. Sinuses/Orbits: Globes and orbits are unremarkable. Sinuses are clear. Other: None. IMPRESSION: 1. No interval change from the exam performed on 05/31/2023. No evidence of a new infarct or of infarct extension. No intracranial hemorrhage. Electronically Signed   By: Amie Portland M.D.   On: 06/01/2023 10:28   MR BRAIN WO CONTRAST  Result Date: 05/31/2023 CLINICAL DATA:  Difficulty standing up EXAM: MRI HEAD WITHOUT CONTRAST TECHNIQUE: Multiplanar, multiecho pulse sequences of the brain and surrounding structures were obtained without intravenous contrast. COMPARISON:  Same-day CT/CTA head and neck, brain MRI 03/27/2023 FINDINGS: Brain: There is a small focus of elevated DWI signal in the left corona radiata surrounding a cystic space on the T2 sequence. The cystic space is new since the brain MRI from 03/27/2023. Findings favored to reflect a late subacute to early chronic lacunar infarct. There is an additional punctate focus of diffusion restriction in the left internal capsule/lentiform nucleus (5-23). There is no other evidence of acute infarct. There is no acute intracranial hemorrhage or extra-axial fluid collection. There is parenchymal volume loss with prominence of the ventricular system and extra-axial CSF spaces. There is unchanged moderate background chronic small-vessel ischemic change. Remote cortical infarcts in the left frontal and right parietal lobes are again seen. A small remote infarct in the pons is unchanged. There is remote hemorrhage in the left basal ganglia. Numerous additional punctate chronic microhemorrhages are also noted, possibly hypertensive in etiology. The pituitary and suprasellar region are normal. There is no mass lesion or abnormal enhancement. There is no mass effect or  midline shift. Vascular: Normal flow voids. Skull and upper cervical spine: Normal marrow signal. Sinuses/Orbits: The paranasal sinuses are clear. The globes and orbits are unremarkable. Other: The mastoid air cells and middle ear cavities are clear. A right nasopharyngeal retention cyst is unchanged. IMPRESSION: 1. Punctate acute to early subacute infarct in the left internal capsule/basal ganglia, and probable late subacute to early chronic infarct in the left corona radiata. 2. Additional remote infarcts, background chronic small-vessel ischemic change, and parenchymal volume loss are  not significantly changed. Electronically Signed   By: Lesia Hausen M.D.   On: 05/31/2023 16:28   CT RENAL STONE STUDY  Result Date: 05/31/2023 CLINICAL DATA:  Abdominal/flank pain, stone suspected EXAM: CT ABDOMEN AND PELVIS WITHOUT CONTRAST TECHNIQUE: Multidetector CT imaging of the abdomen and pelvis was performed following the standard protocol without IV contrast. RADIATION DOSE REDUCTION: This exam was performed according to the departmental dose-optimization program which includes automated exposure control, adjustment of the mA and/or kV according to patient size and/or use of iterative reconstruction technique. COMPARISON:  08/24/2022 CT, 04/01/2023 ultrasound FINDINGS: Lower chest: Mild bibasilar subsegmental atelectasis. Heart size within normal limits. Aortic and coronary artery atherosclerosis. Moderate hiatal hernia. Hepatobiliary: No focal liver abnormality identified on unenhanced images. Cholelithiasis. No pericholecystic inflammatory changes by CT. No biliary dilatation. Pancreas: Unremarkable. No pancreatic ductal dilatation or surrounding inflammatory changes. Spleen: Normal in size without focal abnormality. Adrenals/Urinary Tract: Unremarkable adrenal glands. Stable bilateral renal cysts, better characterized by recent ultrasound and which do not require further follow-up imaging. There is excreted contrast  within bilateral renal collecting systems limiting assessment for the possibility of nonobstructing stones. No hydronephrosis. Excreted contrast within the bladder. Urinary bladder wall appears somewhat thickened and trabeculated. Stomach/Bowel: Moderate hiatal hernia. Stomach is otherwise within normal limits. Appendix appears normal. No evidence of bowel wall thickening, distention, or inflammatory changes. Vascular/Lymphatic: Aortic atherosclerosis. No enlarged abdominal or pelvic lymph nodes. Reproductive: Mild prostatomegaly. Other: No free fluid. No abdominopelvic fluid collection. No pneumoperitoneum. No abdominal wall hernia. Musculoskeletal: No acute or significant osseous findings. Chronic superior endplate compression fracture of L1. Multiple chronic right-sided rib fractures. IMPRESSION: 1. No acute abdominopelvic findings. 2. Excreted contrast within bilateral renal collecting systems limiting assessment for the possibility of nonobstructing stones. No hydronephrosis. 3. Urinary bladder wall appears somewhat thickened and trabeculated, which may be related to chronic bladder outlet obstruction in the setting of mild prostatomegaly. Correlate with urinalysis to exclude cystitis. 4. Cholelithiasis. 5. Moderate hiatal hernia. 6. Aortic atherosclerosis (ICD10-I70.0). Electronically Signed   By: Duanne Guess D.O.   On: 05/31/2023 16:21               LOS: 1 day   Russell Engelstad  [facility]   Pager on [email or web address]. If 7PM-7AM, please contact night-coverage at [email or web address]     06/02/2023, 12:07 PM

## 2023-06-02 NOTE — Progress Notes (Signed)
Speech Language Pathology Treatment: Dysphagia  Patient Details Name: Richard Carter MRN: [id number] DOB: [birth year] Today's Date: 06/02/2023 Time: 4098-1191 SLP Time Calculation (min) (ACUTE ONLY): 25 min  Assessment / Plan / Recommendation Clinical Impression  Pt seen for skilled SLP services targeting functional receptive/expressive communication and dysphagia management. Caregiver/HCPOA, [name], present.    Pt alert, flat affect. Limited spontaneous speech. Per caregiver, pt's speech is improved since Saturday, but not at baseline. Pt's speech c/b decreased length of utterance (often limited to 1-2 words at a time), reduced vocal loudness, paucity of speech, anomia, and articulatory imprecision. Speech characteristic of s/sx aphasia and co-existing aphasia. With max cueing, pt able to use compensatory strategy of speaking louder to improve speech intelligibility.   Pt observed with trials of puree and nectar-thick liquids (via tsp/cup). t with s/sx oral dysphagia including oral holding and "swishing" of nectar-thick liquids. Concern for pharyngeal dysphagia given immediate cough with cup sip of nectar-thick liquids. No other overt s/sx concerning for pharyngeal dysphagia noted. Pt continues to be dependent for feeding which is different from baseline per caregiver. Additionally, pt consumed soft solids and nectar-thick liquids PTA per caregiver.   Recommend cautious continuation of a puree diet with nectar-thick liquids with safe swallowing strategies aspiration precautions as outlined below, including 1:1 assistance for feeding and nectar-thick liquids via tsp only.   SLP to f/u per POC for functional receptive/expressive communication and dysphagia management.   Pt, RN, and caregiver made aware of results, recommendations, and SLP POC. ?full understanding by pt.     HPI HPI: Pt admitted to [facility] on 05/31/23 under observation for c/o stroke like symptoms including: facial droop, dysarthria, and  RLE weakness. Pt outside the window for tPA. Imaging significant for: punctate acute to early subacute infarct in the L internal capsule/basal ganglia, and probable late subacute to early chronic infarct in the L corona radiata. Significant PMH includes: hx CVA, carotid endarterectomy (03/2023), Afib on Eliquis, HTN, hLD, mixed vascular and Alzheimer's dementia, seizure disorder, alcohol use disorder (in remission).      SLP Plan  Continue with current plan of care      Recommendations for follow up therapy are one component of a multi-disciplinary discharge planning process, led by the attending physician.  Recommendations may be updated based on patient status, additional functional criteria and insurance authorization.    Recommendations  Diet recommendations: Dysphagia 1 (puree);Nectar-thick liquid Liquids provided via: Teaspoon Medication Administration: Crushed with puree Supervision: Full supervision/cueing for compensatory strategies;Trained caregiver to feed patient Compensations: Minimize environmental distractions;Slow rate;Small sips/bites                  Oral care QID;Staff/trained caregiver to provide oral care   Frequent or constant Supervision/Assistance Dysphagia, oropharyngeal phase (R13.12)     Continue with current plan of care    Clyde Canterbury, M.S., CCC-SLP Speech-Language Pathologist [facility] [phone] Arnette Felts)  Woodroe Chen  06/02/2023, 12:31 PM

## 2023-06-02 NOTE — Progress Notes (Signed)
Subjective: He had some increasing weakness yesterday, but this improved and he feels now like he is the same as when he first came in.  Repeat CT was negative.  Exam: Vitals:   06/02/23 0523 06/02/23 0800  BP: (!) 184/71 (!) 178/64  Pulse: (!) 50 (!) 58  Resp: 18 18  Temp: (!) 97.4 F (36.3 C) 97.8 F (36.6 C)  SpO2: 99% 99%   Gen: In bed, NAD Resp: non-labored breathing, no acute distress Abd: soft, nt  Neuro: MS: Awake, oriented to the fact that he is in a hospital, but unable to give which hospital, what month or year.  He is able to answer questions, but gives only one or two word answers.  He has an increased latency of response.  He is able to name objects. CN: Visual fields are full, right facial weakness Motor: He has 4/5 minutes of the right arm and leg Sensory: He reports symmetric sensation  Pertinent Labs: LDL 65 from May A1c 5.8 from May   Impression: 82 year old male with recurrent small vessel infarcts in the setting of hypertension, hyperlipidemia, paroxysmal atrial fibrillation.  I agree with Dr. Iver Nestle that I would continue Eliquis monotherapy.  He has been relatively well-controlled from a hypertension standpoint over the past few outpatient visits, his LDL has been at goal for quite some time, and no evidence of diabetes.  He is already receiving very aggressive secondary prevention measures, I am not sure that he would benefit from more aggressive measures.  He was orthostatic by pulse, though not by blood pressure, but this could be contributing to some of his symptoms and if this persist, agree with TED hose and consideration of abdominal binder to see if these could help his symptoms.  Recommendations: 1) no need for EEG 2) continue Eliquis 3) continue Lipitor 20 mg nightly 4) PT, OT, ST 5) neurology will be available on an as-needed basis.  Ritta Slot, MD [facility] [phone]  If 7pm- 7am, please page neurology on call as  listed in [facility].

## 2023-06-03 DIAGNOSIS — I639 Cerebral infarction, unspecified: Secondary | ICD-10-CM | POA: Diagnosis not present

## 2023-06-03 MED ORDER — CEPHALEXIN 500 MG PO CAPS
500.0000 mg | ORAL_CAPSULE | Freq: Two times a day (BID) | ORAL | Status: AC
  Administered 2023-06-04 – 2023-06-06 (×6): 500 mg via ORAL
  Filled 2023-06-03 (×6): qty 1

## 2023-06-03 NOTE — Progress Notes (Signed)
Occupational Therapy Treatment Patient Details Name: Richard Carter MRN: [id number] DOB: Dec 11, 1940 Today's Date: 06/03/2023   History of present illness Pt admitted to [facility] on 05/31/23 under observation for c/o stroke like symptoms including: facial droop, dysarthria, and RLE weakness. Pt outside the window for tPA. Imaging significant for: punctate acute to early subacute infarct in the L internal capsule/basal ganglia, and probable late subacute to early chronic infarct in the L corona radiata. Significant PMH includes: hx CVA, carotid endarterectomy (03/2023), Afib on Eliquis, HTN, hLD, mixed vascular and Alzheimer's dementia, seizure disorder, alcohol use disorder (in remission).   OT comments  Pt seen today for OT treatment to facilitate increased independence in ADL and functional mobility. Presents with generalized weakness, decreased cognition, impaired judgement/safety awareness and deficits in activity tolerance needed for safe, efficient performance of self-care routines. Pt received seated in recliner, pointing at RW and gesturing that he wants to get back in bed. See flowsheet for further details of OT facilitated tx. Pt performs bout of standing for increased activity tolerance with RW, and transfers with min A recliner > EOB. Pt declining self-care tasks at this time. OT will continue to follow acutely to maximize functional gains. Pt left in bed, needs within reach and alarm activated.    Recommendations for follow up therapy are one component of a multi-disciplinary discharge planning process, led by the attending physician.  Recommendations may be updated based on patient status, additional functional criteria and insurance authorization.    Assistance Recommended at Discharge Frequent or constant Supervision/Assistance  Patient can return home with the following  A lot of help with walking and/or transfers;A lot of help with bathing/dressing/bathroom;Assistance with  cooking/housework;Direct supervision/assist for medications management;Direct supervision/assist for financial management;Assist for transportation;Help with stairs or ramp for entrance   Equipment Recommendations  Other (comment)    Recommendations for Other Services      Precautions / Restrictions Precautions Precautions: Fall Restrictions Weight Bearing Restrictions: No       Mobility Bed Mobility Overal bed mobility: Needs Assistance Bed Mobility: Sit to Supine     Supine to sit: Supervision Sit to supine: Supervision   General bed mobility comments: Use of bedrails, increased time and cuing to scoot towards [facility]    Transfers Overall transfer level: Needs assistance Equipment used: Rolling walker (2 wheels) Transfers: Sit to/from Stand Sit to Stand: Min assist           General transfer comment: RW used; cues for safety.     Balance Overall balance assessment: Needs assistance Sitting-balance support: Feet supported       Standing balance support: Bilateral upper extremity supported, During functional activity, Reliant on assistive device for balance Standing balance-Leahy Scale: Poor Standing balance comment: min A for standing balance, poor environmental awareness and decreased judgement                           ADL either performed or assessed with clinical judgement   ADL Overall ADL's : Needs assistance/impaired Eating/Feeding: Set up;Sitting Eating/Feeding Details (indicate cue type and reason): Set up provided per pt request for drinking nectar thick juuice                                   General ADL Comments: Generally weak, decreased activity tolerance, confused and requires cues for safety.    Extremity/Trunk Assessment Upper Extremity Assessment Upper Extremity  Assessment: Generalized weakness   Lower Extremity Assessment Lower Extremity Assessment: Generalized weakness         Cognition  Arousal/Alertness: Awake/alert Behavior During Therapy: WFL for tasks assessed/performed Overall Cognitive Status: Difficult to assess                                 General Comments: Pt A&Ox3, states name, situation, location but unaware of date (1989 when offered choice of two)              General Comments on RA, OT facilitates use of RUE during functional task performance    Pertinent Vitals/ Pain       Pain Assessment Pain Assessment: No/denies pain   Frequency  Min 1X/week        Progress Toward Goals  OT Goals(current goals can now be found in the care plan section)  Progress towards OT goals: Progressing toward goals  Acute Rehab OT Goals OT Goal Formulation: Patient unable to participate in goal setting Time For Goal Achievement: 06/15/23 Potential to Achieve Goals: Fair  Plan Discharge plan remains appropriate       AM-PAC OT "6 Clicks" Daily Activity     Outcome Measure   Help from another person eating meals?: None Help from another person taking care of personal grooming?: A Little Help from another person toileting, which includes using toliet, bedpan, or urinal?: A Lot Help from another person bathing (including washing, rinsing, drying)?: A Lot Help from another person to put on and taking off regular upper body clothing?: A Lot Help from another person to put on and taking off regular lower body clothing?: A Lot 6 Click Score: 15    End of Session Equipment Utilized During Treatment: Gait belt;Rolling walker (2 wheels)  OT Visit Diagnosis: Unsteadiness on feet (R26.81)   Activity Tolerance Patient limited by fatigue   Patient Left in bed;with call bell/phone within reach;with bed alarm set   Nurse Communication          Time: 7829-5621 OT Time Calculation (min): 12 min  Charges: OT General Charges $OT Visit: 1 Visit OT Treatments $Self Care/Home Management : 8-22 mins  Lelend Heinecke L. Ruqayyah Lute, OTR/L  06/03/23, 2:35  PM

## 2023-06-03 NOTE — Progress Notes (Signed)
Occupational Therapy Treatment Patient Details Name: Richard Carter MRN: [id number] DOB: February 13, 1941 Today's Date: 06/03/2023   History of present illness Pt admitted to [facility] on 05/31/23 under observation for c/o stroke like symptoms including: facial droop, dysarthria, and RLE weakness. Pt outside the window for tPA. Imaging significant for: punctate acute to early subacute infarct in the L internal capsule/basal ganglia, and probable late subacute to early chronic infarct in the L corona radiata. Significant PMH includes: hx CVA, carotid endarterectomy (03/2023), Afib on Eliquis, HTN, hLD, mixed vascular and Alzheimer's dementia, seizure disorder, alcohol use disorder (in remission).   OT comments  Pt seen for second OT tx this date; pt attempting to get up OOB without assistance. OT facilitates session to improve functional performance by increasing tolerance to OOB activity including sit to stands, standing marches in place, and bouts of prolonged static standing. Progression during session from initally requiring minA to rise from seated position to CGA. Pt tolerates 20 reps total of standing marches in place, poor balance with minA to start, progressing again towards CGA end of tx. Multimodal cues for upright posture as pt tends to stand with trunk excessively flexed. 15 reps of sit to stands. Pt performs grooming task seated EOB to wash/dry face. Transfers to recliner with CGA and RW. RN notified of pt's position. OT will continue to follow acutely.    Recommendations for follow up therapy are one component of a multi-disciplinary discharge planning process, led by the attending physician.  Recommendations may be updated based on patient status, additional functional criteria and insurance authorization.    Assistance Recommended at Discharge Frequent or constant Supervision/Assistance  Patient can return home with the following  A lot of help with walking and/or transfers;A lot of help with  bathing/dressing/bathroom;Assistance with cooking/housework;Direct supervision/assist for medications management;Direct supervision/assist for financial management;Assist for transportation;Help with stairs or ramp for entrance   Equipment Recommendations  Other (comment)       Precautions / Restrictions Precautions Precautions: Fall Restrictions Weight Bearing Restrictions: No       Mobility Bed Mobility Overal bed mobility: Needs Assistance Bed Mobility: Sit to Supine     Supine to sit: Supervision Sit to supine: Supervision   General bed mobility comments: Use of bedrails, increased time and cuing to scoot towards [facility]    Transfers Overall transfer level: Needs assistance Equipment used: Rolling walker (2 wheels) Transfers: Sit to/from Stand Sit to Stand: Min guard           General transfer comment: RW used; cues for safety.     Balance Overall balance assessment: Needs assistance Sitting-balance support: Feet supported Sitting balance-Leahy Scale: Good     Standing balance support: Bilateral upper extremity supported, During functional activity, Reliant on assistive device for balance Standing balance-Leahy Scale: Poor Standing balance comment: Cues for upright posture (tactile and verbal)                           ADL either performed or assessed with clinical judgement   ADL Overall ADL's : Needs assistance/impaired Eating/Feeding: Set up;Sitting Eating/Feeding Details (indicate cue type and reason): Set up provided per pt request for drinking nectar thick juuice Grooming: Wash/dry hands;Wash/dry face;Set up;Sitting                                 General ADL Comments: Generally weak, decreased activity tolerance, confused and requires cues  for safety.    Extremity/Trunk Assessment Upper Extremity Assessment Upper Extremity Assessment: Generalized weakness   Lower Extremity Assessment Lower Extremity Assessment: Generalized  weakness         Cognition Arousal/Alertness: Awake/alert Behavior During Therapy: WFL for tasks assessed/performed Overall Cognitive Status: Difficult to assess                                 General Comments: Pt A&Ox3, states name, situation, location but unaware of date (1989 when offered choice of two)        Exercises Exercises: General Lower Extremity General Exercises - Lower Extremity Ankle Circles/Pumps: Standing, Both, AROM Hip Flexion/Marching: 20 reps Other Exercises Other Exercises: Participates in static standing for increased activity tolerance, OT facilitates sit to stands for functional transfers. 15 STS performed total. Pt tolerating bouts of standing for ~2 mins.    Shoulder Instructions       General Comments on RA, OT facilitates use of RUE during functional task performance    Pertinent Vitals/ Pain       Pain Assessment Pain Assessment: No/denies pain         Frequency  Min 1X/week        Progress Toward Goals  OT Goals(current goals can now be found in the care plan section)  Progress towards OT goals: Progressing toward goals  Acute Rehab OT Goals OT Goal Formulation: Patient unable to participate in goal setting Time For Goal Achievement: 06/15/23 Potential to Achieve Goals: Fair ADL Goals Pt Will Perform Grooming: with supervision;sitting Pt Will Perform Lower Body Dressing: with min assist;sit to/from stand Pt Will Transfer to Toilet: with min guard assist;ambulating;bedside commode Pt Will Perform Toileting - Clothing Manipulation and hygiene: with min guard assist;sit to/from stand Pt Will Perform Tub/Shower Transfer: Shower transfer;with min guard assist;rolling walker;ambulating;shower seat  Plan Discharge plan remains appropriate       AM-PAC OT "6 Clicks" Daily Activity     Outcome Measure   Help from another person eating meals?: None Help from another person taking care of personal grooming?: A  Little Help from another person toileting, which includes using toliet, bedpan, or urinal?: A Lot Help from another person bathing (including washing, rinsing, drying)?: A Lot Help from another person to put on and taking off regular upper body clothing?: A Lot Help from another person to put on and taking off regular lower body clothing?: A Lot 6 Click Score: 15    End of Session Equipment Utilized During Treatment: Gait belt;Rolling walker (2 wheels)  OT Visit Diagnosis: Unsteadiness on feet (R26.81)   Activity Tolerance Patient tolerated treatment well   Patient Left in chair;with call bell/phone within reach;with chair alarm set   Nurse Communication Mobility status        Time: 9937-1696 OT Time Calculation (min): 23 min  Charges: OT General Charges $OT Visit: 1 Visit OT Treatments $Self Care/Home Management : 8-22 mins $Therapeutic Activity: 8-22 mins Maycee Blasco L. Judyth Demarais, OTR/L  06/03/23, 4:52 PM

## 2023-06-03 NOTE — Progress Notes (Signed)
Physical Therapy Treatment Patient Details Name: Richard Carter MRN: [id number] DOB: 1941/10/11 Today's Date: 06/03/2023   History of Present Illness Pt admitted to [facility] on 05/31/23 under observation for c/o stroke like symptoms including: facial droop, dysarthria, and RLE weakness. Pt outside the window for tPA. Imaging significant for: punctate acute to early subacute infarct in the L internal capsule/basal ganglia, and probable late subacute to early chronic infarct in the L corona radiata. Significant PMH includes: hx CVA, carotid endarterectomy (03/2023), Afib on Eliquis, HTN, hLD, mixed vascular and Alzheimer's dementia, seizure disorder, alcohol use disorder (in remission).    PT Comments  Pt resting in bed upon PT arrival; pt agreeable to therapy.  During session pt min assist semi-supine to sitting edge of bed; CGA with transfers using RW; and CGA to min assist to ambulate 6 feet and then (after sitting rest break) 4 feet with RW use.  Limited distance ambulating d/t pt fatigue.  Pt requiring vc's for safety during session.  Will continue to focus on strengthening and progressive functional mobility during hospitalization.     Assistance Recommended at Discharge Frequent or constant Supervision/Assistance  If plan is discharge home, recommend the following:  Can travel by private vehicle    A lot of help with walking and/or transfers;A little help with bathing/dressing/bathroom;Assistance with cooking/housework;Direct supervision/assist for medications management;Direct supervision/assist for financial management;Assist for transportation;Help with stairs or ramp for entrance   Yes  Equipment Recommendations  Other (comment) (TBD at next facility)    Recommendations for Other Services       Precautions / Restrictions Precautions Precautions: Fall Precaution Comments: Aspiration; DNR Restrictions Weight Bearing Restrictions: No     Mobility  Bed Mobility Overal bed mobility:  Needs Assistance Bed Mobility: Supine to Sit     Supine to sit: Min assist, HOB elevated     General bed mobility comments: assist for trunk; vc's for technique    Transfers Overall transfer level: Needs assistance Equipment used: Rolling walker (2 wheels) Transfers: Sit to/from Stand, Bed to chair/wheelchair/BSC Sit to Stand: Min guard   Step pivot transfers: Min guard (stand step turn bed to recliner (x2 trials) with RW use)       General transfer comment: vc's for UE placement; x2 trials from bed and x1 trial from recliner (sit to stand)    Ambulation/Gait Ambulation/Gait assistance: Min guard, Min assist Gait Distance (Feet):  (6 feet; 4 feet) Assistive device: Rolling walker (2 wheels) Gait Pattern/deviations: Decreased step length - right, Decreased step length - left Gait velocity: decreased     General Gait Details: wide BOS; vc's for safety   Stairs             Wheelchair Mobility     Tilt Bed    Modified Rankin (Stroke Patients Only)       Balance Overall balance assessment: Needs assistance Sitting-balance support: No upper extremity supported, Feet supported Sitting balance-Leahy Scale: Good Sitting balance - Comments: steady reaching within BOS   Standing balance support: Bilateral upper extremity supported, During functional activity, Reliant on assistive device for balance Standing balance-Leahy Scale: Poor Standing balance comment: vc's for safety and upright posture                            Cognition Arousal/Alertness: Awake/alert Behavior During Therapy: WFL for tasks assessed/performed Overall Cognitive Status: No family/caregiver present to determine baseline cognitive functioning  General Comments: Oriented to person, place, situation, and month/year        Exercises      General Comments Nursing cleared pt for participation in physical therapy.  Pt agreeable to  PT session.      Pertinent Vitals/Pain Pain Assessment Pain Assessment: No/denies pain Vitals (HR and SpO2 on room air) stable and WFL throughout treatment session.    Home Living                          Prior Function            PT Goals (current goals can now be found in the care plan section) Acute Rehab PT Goals Patient Stated Goal: to go home PT Goal Formulation: With patient Time For Goal Achievement: 06/15/23 Potential to Achieve Goals: Fair Progress towards PT goals: Progressing toward goals    Frequency    Min 1X/week      PT Plan Frequency needs to be updated (Frequency adjusted based on updated pt centered delivery of care model therapy guidelines)    Co-evaluation              AM-PAC PT "6 Clicks" Mobility   Outcome Measure  Help needed turning from your back to your side while in a flat bed without using bedrails?: A Little Help needed moving from lying on your back to sitting on the side of a flat bed without using bedrails?: A Little Help needed moving to and from a bed to a chair (including a wheelchair)?: A Little Help needed standing up from a chair using your arms (e.g., wheelchair or bedside chair)?: A Little Help needed to walk in hospital room?: A Little Help needed climbing 3-5 steps with a railing? : A Lot 6 Click Score: 17    End of Session Equipment Utilized During Treatment: Gait belt Activity Tolerance: Patient limited by fatigue Patient left: in chair;with call bell/phone within reach;with chair alarm set Nurse Communication: Mobility status;Precautions PT Visit Diagnosis: Unsteadiness on feet (R26.81);Other abnormalities of gait and mobility (R26.89);Muscle weakness (generalized) (M62.81);History of falling (Z91.81);Difficulty in walking, not elsewhere classified (R26.2);Hemiplegia and hemiparesis Hemiplegia - Right/Left: Right     Time: 7829-5621 PT Time Calculation (min) (ACUTE ONLY): 26 min  Charges:     $Gait Training: 8-22 mins $Therapeutic Activity: 8-22 mins PT General Charges $$ ACUTE PT VISIT: 1 Visit                     Hendricks Limes, PT 06/03/23, 5:31 PM

## 2023-06-03 NOTE — Progress Notes (Signed)
Progress Note    Richard Carter  ZOX:[id number] DOB: 1941/04/29  DOA: 05/31/2023 PCP: Marisue Ivan, MD      Brief Narrative:    Medical records reviewed and are as summarized below:  Richard Carter is a 82 y.o. male  with medical history significant of recurrent CVA with most recent in May 2024 s/p endarterectomy, A-fib on Eliquis, hypertension, hyperlipidemia, mixed vascular and Alzheimer's dementia, seizure disorder, alcohol use disorder in remission, who presented to the hospital with facial droop, slurred speech and right leg weakness.    Assessment/Plan:   Principal Problem:   Acute CVA (cerebrovascular accident) (HCC) Active Problems:   UTI (urinary tract infection)   AKI (acute kidney injury) (HCC)   AF (paroxysmal atrial fibrillation) (HCC)   HTN (hypertension)   Seizures (HCC)   Dementia (HCC)    Body mass index is 25.84 kg/m.   Acute stroke, recurrent: Speech is still slurred with right leg weakness has improved.  Continue Eliquis and Lipitor.  Continue PT, OT and ST. Recent stroke s/p left carotid endarterectomy on 04/02/2023 Dysphagia: Speech therapist recommended dysphagia 1 diet and nectar thick liquids. MRI brain: Punctate acute to early subacute infarct in the left internal capsule/basal ganglia, and probable late subacute to early chronic infarct in the left corona radiata   Acute UTI: Urine culture showed E. coli.  Change IV ceftriaxone to oral Keflex  BPH: Continue Flomax.  Mild prostatomegaly on CT renal stone study.   Hypertension: BP is elevated.  Restart amlodipine Sinus bradycardia: Asymptomatic  AKI: Improved   Seizure disorder: Continue Keppra   Dementia: Continue supportive care  Diet Order             DIET - DYS 1 Room service appropriate? Yes; Fluid consistency: Nectar Thick  Diet effective now                            Consultants: Neurologist  Procedures: None    Medications:     amLODipine  10 mg Oral Daily   apixaban  5 mg Oral BID   atorvastatin  20 mg Oral QPM   [START ON 06/04/2023] cephALEXin  500 mg Oral Q8H   levETIRAcetam  500 mg Oral BID   pantoprazole  40 mg Oral Daily   tamsulosin  0.4 mg Oral QPC breakfast   traZODone  100 mg Oral QHS   Continuous Infusions:     Anti-infectives (From admission, onward)    Start     Dose/Rate Route Frequency Ordered Stop   06/04/23 0600  cephALEXin (KEFLEX) capsule 500 mg        500 mg Oral Every 8 hours 06/03/23 1357     06/01/23 1300  cefTRIAXone (ROCEPHIN) 1 g in sodium chloride 0.9 % 100 mL IVPB  Status:  Discontinued        1 g 200 mL/hr over 30 Minutes Intravenous Every 24 hours 05/31/23 1531 06/03/23 1357   05/31/23 1215  cefTRIAXone (ROCEPHIN) 2 g in sodium chloride 0.9 % 100 mL IVPB        2 g 200 mL/hr over 30 Minutes Intravenous  Once 05/31/23 1210 05/31/23 1603              Family Communication/Anticipated D/C date and plan/Code Status   DVT prophylaxis:  apixaban (ELIQUIS) tablet 5 mg     Code Status: DNR  Family Communication: None Disposition Plan: Plan to discharge to SNF  Status is: Inpatient Remains inpatient appropriate because: Acute stroke, awaiting placement to SNF       Subjective:   Interval events noted.  He has no complaints.  Objective:    Vitals:   06/02/23 2323 06/03/23 0438 06/03/23 0742 06/03/23 1122  BP: (!) 163/71 (!) 146/64 (!) 149/77 118/67  Pulse: 65 75 63 90  Resp: 18 18 20 18   Temp: 98.2 F (36.8 C) 98.2 F (36.8 C) 98.4 F (36.9 C) 97.6 F (36.4 C)  TempSrc: Oral Oral Oral Oral  SpO2: 98% 96% (!) 89% 96%  Weight:      Height:       No data found.    Intake/Output Summary (Last 24 hours) at 06/03/2023 1358 Last data filed at 06/03/2023 1320 Gross per 24 hour  Intake --  Output 750 ml  Net -750 ml   Filed Weights   05/31/23 1034  Weight: 74.8 kg    Exam:  GEN: NAD SKIN: Warm and dry EYES: EOMI ENT: MMM CV: RRR PULM:  CTA B ABD: soft, ND, NT, +BS CNS: AAO x 3, slurred speech, power RUE - 3/5, RLE - 5/5 EXT: No edema or tenderness       Data Reviewed:   I have personally reviewed following labs and imaging studies:  Labs: Labs show the following:   Basic Metabolic Panel: Recent Labs  Lab 05/31/23 1131 06/01/23 0710  NA 137 139  K 3.6 4.0  CL 109 109  CO2 21* 22  GLUCOSE 98 94  BUN 20 21  CREATININE 1.32* 1.17  CALCIUM 9.3 8.9   GFR Estimated Creatinine Clearance: 44.7 mL/min (by C-G formula based on SCr of 1.17 mg/dL). Liver Function Tests: Recent Labs  Lab 05/31/23 1131  AST 19  ALT 12  ALKPHOS 68  BILITOT 0.5  PROT 7.1  ALBUMIN 3.6   No results for input(s): "LIPASE", "AMYLASE" in the last 168 hours. No results for input(s): "AMMONIA" in the last 168 hours. Coagulation profile No results for input(s): "INR", "PROTIME" in the last 168 hours.  CBC: Recent Labs  Lab 05/31/23 1131 06/01/23 0710  WBC 7.9 7.3  NEUTROABS 5.3 5.4  HGB 11.0* 10.2*  HCT 34.5* 31.9*  MCV 89.6 87.9  PLT 301 294   Cardiac Enzymes: No results for input(s): "CKTOTAL", "CKMB", "CKMBINDEX", "TROPONINI" in the last 168 hours. BNP (last 3 results) No results for input(s): "PROBNP" in the last 8760 hours. CBG: Recent Labs  Lab 05/31/23 1033  GLUCAP 92   D-Dimer: No results for input(s): "DDIMER" in the last 72 hours. Hgb A1c: No results for input(s): "HGBA1C" in the last 72 hours. Lipid Profile: No results for input(s): "CHOL", "HDL", "LDLCALC", "TRIG", "CHOLHDL", "LDLDIRECT" in the last 72 hours. Thyroid function studies: No results for input(s): "TSH", "T4TOTAL", "T3FREE", "THYROIDAB" in the last 72 hours.  Invalid input(s): "FREET3" Anemia work up: No results for input(s): "VITAMINB12", "FOLATE", "FERRITIN", "TIBC", "IRON", "RETICCTPCT" in the last 72 hours. Sepsis Labs: Recent Labs  Lab 05/31/23 1131 06/01/23 0710  WBC 7.9 7.3    Microbiology Recent Results (from the past  240 hour(s))  Urine Culture     Status: Abnormal   Collection Time: 05/31/23 11:58 AM   Specimen: Urine, Clean Catch  Result Value Ref Range Status   Specimen Description   Final    URINE, CLEAN CATCH Performed at [facility], [address] [place], [place] [address]    Special Requests   Final    NONE Performed  at [facility], [address] [place], [place] [address]    Culture >=100,000 COLONIES/mL ESCHERICHIA COLI (A)  Final   Report Status 06/02/2023 FINAL  Final   Organism ID, Bacteria ESCHERICHIA COLI (A)  Final      Susceptibility   Escherichia coli - MIC*    AMPICILLIN 16 INTERMEDIATE Intermediate     CEFAZOLIN <=4 SENSITIVE Sensitive     CEFEPIME <=0.12 SENSITIVE Sensitive     CEFTRIAXONE <=0.25 SENSITIVE Sensitive     CIPROFLOXACIN >=4 RESISTANT Resistant     GENTAMICIN <=1 SENSITIVE Sensitive     IMIPENEM 1 SENSITIVE Sensitive     NITROFURANTOIN 32 SENSITIVE Sensitive     TRIMETH/SULFA >=320 RESISTANT Resistant     AMPICILLIN/SULBACTAM 8 SENSITIVE Sensitive     PIP/TAZO <=4 SENSITIVE Sensitive     * >=100,000 COLONIES/mL ESCHERICHIA COLI    Procedures and diagnostic studies:  No results found.             LOS: 2 days   Johsua Shevlin  [facility]   Pager on [email or web address]. If 7PM-7AM, please contact night-coverage at [email or web address]     06/03/2023, 1:58 PM

## 2023-06-04 DIAGNOSIS — I639 Cerebral infarction, unspecified: Secondary | ICD-10-CM | POA: Diagnosis not present

## 2023-06-04 NOTE — Progress Notes (Addendum)
Physical Therapy Treatment Patient Details Name: Richard Carter MRN: [id number] DOB: [birth year] Today's Date: 06/04/2023   History of Present Illness Pt admitted to [facility] on 05/31/23 under observation for c/o stroke like symptoms including: facial droop, dysarthria, and RLE weakness. Pt outside the window for tPA. Imaging significant for: punctate acute to early subacute infarct in the L internal capsule/basal ganglia, and probable late subacute to early chronic infarct in the L corona radiata. Significant PMH includes: hx CVA, carotid endarterectomy (03/2023), Afib on Eliquis, HTN, hLD, mixed vascular and Alzheimer's dementia, seizure disorder, alcohol use disorder (in remission).    PT Comments  Pt seen for PT tx with pt agreeable, neighbor present but exiting shortly after PT arrival. Pt with expressive communication deficits that limit session but pt does report not feeling great, unable to elaborate further. On this date, pt is able to transition supine>sit with min assist but requires max assist to fully scoot to EOB. Pt transfers STS with min assist & ambulates short distance forwards + backwards in room with RW & min<>mod assist. Pt presents with RLE weakness & impaired gait pattern as noted below. After seated rest break pt declined further participation without explanation. Recommend ongoing PT services to address strengthening, balance, endurance, NMR, to increase independence with mobility & reduce fall risk.     Assistance Recommended at Discharge Frequent or constant Supervision/Assistance  If plan is discharge home, recommend the following:  Can travel by private vehicle    A lot of help with walking and/or transfers;Assistance with cooking/housework;Direct supervision/assist for medications management;Direct supervision/assist for financial management;Assist for transportation;Help with stairs or ramp for entrance;A lot of help with bathing/dressing/bathroom   Yes  Equipment  Recommendations   (TBD in next venue)  Addendum: if pt declines SNF, pt may benefit from hospital bed (pt has all other DME needs per chart)   Recommendations for Other Services       Precautions / Restrictions Precautions Precaution Comments: Aspiration; DNR Restrictions Weight Bearing Restrictions: No     Mobility  Bed Mobility Overal bed mobility: Needs Assistance Bed Mobility: Supine to Sit     Supine to sit: Min assist, HOB elevated     General bed mobility comments: Pt able to come to sitting EOB but requires max assist to fully scoot to EOB so feet touch floor.    Transfers Overall transfer level: Needs assistance Equipment used: Rolling walker (2 wheels) Transfers: Sit to/from Stand, Bed to chair/wheelchair/BSC Sit to Stand: Min assist   Step pivot transfers: Min assist (with RW)       General transfer comment: Extra time & cuing for advancing RLE to step to recliner.    Ambulation/Gait Ambulation/Gait assistance: Min assist, Mod assist Gait Distance (Feet):  (4 ft forwards + 4 ft backwards) Assistive device: Rolling walker (2 wheels) Gait Pattern/deviations: Decreased step length - right, Decreased step length - left, Decreased dorsiflexion - right, Decreased stride length, Decreased weight shift to right Gait velocity: decreased     General Gait Details: Pt with R knee A/P instability but no significant buckling noted. Pt pushing RW out in front of him progressing to more flexed posture with max cuing to ambulate within base of AD. Pt requires cuing to advance RLE.   Stairs             Wheelchair Mobility     Tilt Bed    Modified Rankin (Stroke Patients Only)       Balance Overall balance assessment: Needs assistance Sitting-balance  support: No upper extremity supported, Feet supported Sitting balance-Leahy Scale: Fair Sitting balance - Comments: supervision sitting EOB   Standing balance support: Bilateral upper extremity supported,  During functional activity, Reliant on assistive device for balance Standing balance-Leahy Scale: Poor                              Cognition Arousal/Alertness: Awake/alert Behavior During Therapy: WFL for tasks assessed/performed Overall Cognitive Status: No family/caregiver present to determine baseline cognitive functioning                                 General Comments: follows simple commands inconsistently with extra time throughout session        Exercises      General Comments        Pertinent Vitals/Pain Pain Assessment Pain Assessment: Faces Faces Pain Scale: No hurt    Home Living                          Prior Function            PT Goals (current goals can now be found in the care plan section) Acute Rehab PT Goals Patient Stated Goal: to go home PT Goal Formulation: With patient Time For Goal Achievement: 06/15/23 Potential to Achieve Goals: Fair Progress towards PT goals: Progressing toward goals    Frequency    Min 1X/week      PT Plan Current plan remains appropriate    Co-evaluation              AM-PAC PT "6 Clicks" Mobility   Outcome Measure  Help needed turning from your back to your side while in a flat bed without using bedrails?: A Little Help needed moving from lying on your back to sitting on the side of a flat bed without using bedrails?: A Lot Help needed moving to and from a bed to a chair (including a wheelchair)?: A Lot Help needed standing up from a chair using your arms (e.g., wheelchair or bedside chair)?: A Little Help needed to walk in hospital room?: A Lot Help needed climbing 3-5 steps with a railing? : A Lot 6 Click Score: 14    End of Session   Activity Tolerance: Patient limited by fatigue Patient left: in chair;with chair alarm set;with call bell/phone within reach Nurse Communication: Mobility status PT Visit Diagnosis: Unsteadiness on feet (R26.81);Other  abnormalities of gait and mobility (R26.89);Muscle weakness (generalized) (M62.81);History of falling (Z91.81);Difficulty in walking, not elsewhere classified (R26.2);Hemiplegia and hemiparesis Hemiplegia - Right/Left: Right Hemiplegia - caused by: Cerebral infarction     Time: 1610-9604 PT Time Calculation (min) (ACUTE ONLY): 17 min  Charges:    $Therapeutic Activity: 8-22 mins PT General Charges $$ ACUTE PT VISIT: 1 Visit                     Aleda Grana, PT, DPT 06/04/23, 12:10 PM   Sandi Mariscal 06/04/2023, 11:42 AM

## 2023-06-04 NOTE — Progress Notes (Addendum)
SLP Cancellation Note  Patient Details Name: Richard Carter MRN: [id number] DOB: January 26, 1941   Cancelled treatment:       Reason Eval/Treat Not Completed:  (chart reviewed; pt reported not feeling well and requested f/u tomorrow.) Noted TOC's note for d/c planing.     Jerilynn Som, MS, CCC-SLP Speech Language Pathologist Rehab Services; [facility] [phone] (ascom) Opha Mcghee 06/04/2023, 5:10 PM

## 2023-06-04 NOTE — TOC Initial Note (Signed)
Transition of Care [facility]) - Initial/Assessment Note    Patient Details  Name: Richard Carter MRN: [id number] Date of Birth: 29-Aug-1941  Transition of [facility] CM/SW Contact:    Garret Reddish, RN Phone Number: 06/04/2023, 11:59 AM  Clinical Narrative:     Chart reviewed.  Noted that patient was admitted with CVA.  Noted that patient also had recent admission for CVA.  I have spoken with patient's POA Richard Carter.  [name] informs me that prior to admission patient lived by himself.  He reports that he lives very near him and assist with his daily needs.  He reports that he assisted with [name]'s bathing and dressing. Richard Carter reports that patient was able to feed himself.  He took patient to appointments.    Richard Carter reports that patient has a BSC, 2 wheeled rolling walker, and wheelchair at home. Richard Carter also informed me that patient has a walk in shower.    I have spoken to [facility] about SNF.  Richard Carter reports that patient was at [facility]  for rehab for about 20 days.  He also reports that patient had went into his co-days at the facility.  Richard Carter is very concerned that [name] continues to have strokes.  Richard Carter would prefer to take [name] home and care for him at his home.  Richard Carter reports that patient is active with [facility].   I have received a call from [facility] and they have informed me that patient is active for PT, OT and Speech.    TOC will continue to follow progress of patient.                Expected Discharge Plan: Home w Home Health Services Barriers to Discharge: No Barriers Identified   Patient Goals and CMS Choice   CMS Medicare.gov Compare Post Acute Care list provided to:: Patient Represenative (must comment) (POA Richard Carter)        Expected Discharge Plan and Services   Discharge Planning Services: CM Consult Post Acute Care Choice: Home Health Living arrangements for the past 2 months: Single Family Home                   DME Agency:  (Patient  has a BSC, wheelchair and 2 wheeled rollling walker at home.)       HH Arranged:  (Patient is active with [facility])          Prior Living Arrangements/Services Living arrangements for the past 2 months: Single Family Home Lives with:: Self (Mrs. Richard Carter lives very close to him and assist with her needs at home.)              Current home services: Home OT, Home PT, Other (comment) (Speech)    Activities of Daily Living Home Assistive Devices/Equipment: Environmental consultant (specify type) ADL Screening (condition at time of admission) Patient's cognitive ability adequate to safely complete daily activities?: No Is the patient deaf or have difficulty hearing?: Yes Does the patient have difficulty seeing, even when wearing glasses/contacts?: No Does the patient have difficulty concentrating, remembering, or making decisions?: Yes Patient able to express need for assistance with ADLs?: Yes Does the patient have difficulty dressing or bathing?: Yes Independently performs ADLs?: No Communication: Needs assistance Dressing (OT): Needs assistance Grooming: Needs assistance Feeding: Needs assistance Bathing: Needs assistance Toileting: Needs assistance In/Out Bed: Needs assistance Walks in Home: Needs assistance Weakness of Legs: Both Weakness of Arms/Hands: None  Permission Sought/Granted  Emotional Assessment       Orientation: : Oriented to Self, Oriented to Place      Admission diagnosis:  Acute cystitis without hematuria [N30.00] Stroke-like symptoms [R29.90] Acute CVA (cerebrovascular accident) [facility]) [I63.9] Patient Active Problem List   Diagnosis Date Noted   History of alcohol use disorder 05/31/2023   Goals of care, counseling/discussion 04/03/2023   Malnutrition of moderate degree 04/03/2023   Stroke (HCC) 03/27/2023   Stenosis of left carotid artery 02/20/2023   Altered mental status 02/20/2023   Acute stroke due to ischemia (HCC)  02/17/2023   Stroke-like symptom 02/16/2023   Leukocytosis 02/16/2023   Anemia of chronic disease 07/22/2022   Protein-calorie malnutrition, severe 05/28/2022   Dysphagia due to recent cerebrovascular accident 05/28/2022   Acute metabolic encephalopathy 05/27/2022   History of stroke 05/27/2022   Aspiration pneumonia (HCC) 04/23/2022   Nicotine dependence    Elevated troponin    Acute CVA (cerebrovascular accident) (HCC) 05/07/2021   Non compliance w medication regimen 05/07/2021   Dementia (HCC) 05/07/2021   AF (paroxysmal atrial fibrillation) (HCC) 05/07/2021   HTN (hypertension) 11/29/2020   Alcohol abuse 11/29/2020   AKI (acute kidney injury) (HCC) 11/29/2020   Atrial flutter with rapid ventricular response (HCC) 11/29/2020   Seizures (HCC) 02/22/2020   Hypomagnesemia 10/29/2019   Pubic ramus fracture (HCC) 07/29/2019   Acetabular fracture (HCC) 07/29/2019   HLD (hyperlipidemia) 07/29/2019   GERD (gastroesophageal reflux disease) 07/29/2019   UTI (urinary tract infection) 07/29/2019   Vitamin D deficiency 09/29/2015   PCP:  Marisue Ivan, MD Pharmacy:   [facility] [address]), [place] - [address] Jerilynn Mages [place]) [place] [address] Phone: [phone] Fax: [phone]     Social Determinants of Health (SDOH) Social History: SDOH Screenings   Food Insecurity: Patient Declined (05/31/2023)  Housing: Patient Declined (05/31/2023)  Transportation Needs: Patient Declined (05/31/2023)  Utilities: Patient Declined (05/31/2023)  Tobacco Use: Medium Risk (05/31/2023)   SDOH Interventions:     Readmission Risk Interventions    04/21/2022    1:55 PM  Readmission Risk Prevention Plan  Post Dischage Appt Complete  Medication Screening Complete  Transportation Screening Complete

## 2023-06-04 NOTE — Progress Notes (Signed)
PROGRESS NOTE    Richard Carter  ZOX:[id number] DOB: 1941/03/17 DOA: 05/31/2023 PCP: Marisue Ivan, MD   Assessment & Plan:   Principal Problem:   Acute CVA (cerebrovascular accident) [facility]) Active Problems:   UTI (urinary tract infection)   AKI (acute kidney injury) (HCC)   AF (paroxysmal atrial fibrillation) (HCC)   HTN (hypertension)   Seizures (HCC)   Dementia (HCC)  Assessment and Plan: Acute CVA: presented w/ slurred with right leg weakness. Continue on eliquis, statin. PT/OT recs SNF. Recent CVA s/p left carotid endarterectomy on 04/02/2023  Likely PAF: continue on eliquis. Not on any rate controlling meds as per med rec   Dysphagia: likely secondary to above. Continue on dysphagia I diet as per speech    Acute UTI: urine culture showed E. coli.  Change IV ceftriaxone to oral Keflex to complete the course  BPH: continue on tamsulosin    HTN: continue on amlodipine   HLD: continue on statin    AKI: Cr is trending down today. Avoid nephrotoxic meds    Seizure disorder: continue on keppra   Dementia: continue w/ supportive care       DVT prophylaxis: eliquis  Code Status: DNR Family Communication: discussed pt's care w/ pt's POA, [name], and answered his questions Disposition Plan: d/c home w/ HH vs SNF  Level of care: Telemetry Medical Status is: Inpatient Remains inpatient appropriate because: severity of illness   Consultants:  Neuro   Procedures:   Antimicrobials: keflex   Subjective: Pt c/o not feeling.   Objective: Vitals:   06/03/23 1949 06/03/23 2329 06/04/23 0342 06/04/23 0724  BP: 130/68 122/75 131/65 (!) 143/75  Pulse: 64 63 (!) 58 (!) 53  Resp: 17 17 17 17   Temp: 98.6 F (37 C) 97.9 F (36.6 C) 98.1 F (36.7 C) 97.8 F (36.6 C)  TempSrc: Oral Oral Oral   SpO2: 95% 94% 95% 95%  Weight:      Height:        Intake/Output Summary (Last 24 hours) at 06/04/2023 0738 Last data filed at 06/03/2023 1320 Gross per 24 hour  Intake  --  Output 500 ml  Net -500 ml   Filed Weights   05/31/23 1034  Weight: 74.8 kg    Examination:  General exam: Appears calm and comfortable  Respiratory system: Clear to auscultation. Respiratory effort normal. Cardiovascular system: S1 & S2+. No rubs, gallops or clicks.  Gastrointestinal system: Abdomen is nondistended, soft and nontender. Normal bowel sounds heard. Central nervous system: Alert and awake Psychiatry: Judgement and insight appears poor. Flat mood and affect    Data Reviewed: I have personally reviewed following labs and imaging studies  CBC: Recent Labs  Lab 05/31/23 1131 06/01/23 0710  WBC 7.9 7.3  NEUTROABS 5.3 5.4  HGB 11.0* 10.2*  HCT 34.5* 31.9*  MCV 89.6 87.9  PLT 301 294   Basic Metabolic Panel: Recent Labs  Lab 05/31/23 1131 06/01/23 0710  NA 137 139  K 3.6 4.0  CL 109 109  CO2 21* 22  GLUCOSE 98 94  BUN 20 21  CREATININE 1.32* 1.17  CALCIUM 9.3 8.9   GFR: Estimated Creatinine Clearance: 44.7 mL/min (by C-G formula based on SCr of 1.17 mg/dL). Liver Function Tests: Recent Labs  Lab 05/31/23 1131  AST 19  ALT 12  ALKPHOS 68  BILITOT 0.5  PROT 7.1  ALBUMIN 3.6   No results for input(s): "LIPASE", "AMYLASE" in the last 168 hours. No results for input(s): "AMMONIA" in the  last 168 hours. Coagulation Profile: No results for input(s): "INR", "PROTIME" in the last 168 hours. Cardiac Enzymes: No results for input(s): "CKTOTAL", "CKMB", "CKMBINDEX", "TROPONINI" in the last 168 hours. BNP (last 3 results) No results for input(s): "PROBNP" in the last 8760 hours. HbA1C: No results for input(s): "HGBA1C" in the last 72 hours. CBG: Recent Labs  Lab 05/31/23 1033  GLUCAP 92   Lipid Profile: No results for input(s): "CHOL", "HDL", "LDLCALC", "TRIG", "CHOLHDL", "LDLDIRECT" in the last 72 hours. Thyroid Function Tests: No results for input(s): "TSH", "T4TOTAL", "FREET4", "T3FREE", "THYROIDAB" in the last 72 hours. Anemia  Panel: No results for input(s): "VITAMINB12", "FOLATE", "FERRITIN", "TIBC", "IRON", "RETICCTPCT" in the last 72 hours. Sepsis Labs: No results for input(s): "PROCALCITON", "LATICACIDVEN" in the last 168 hours.  Recent Results (from the past 240 hour(s))  Urine Culture     Status: Abnormal   Collection Time: 05/31/23 11:58 AM   Specimen: Urine, Clean Catch  Result Value Ref Range Status   Specimen Description   Final    URINE, CLEAN CATCH Performed at [facility], [address], [place], [place] [address]    Special Requests   Final    NONE Performed at [facility], [address] [place], [place] [address]    Culture >=100,000 COLONIES/mL ESCHERICHIA COLI (A)  Final   Report Status 06/02/2023 FINAL  Final   Organism ID, Bacteria ESCHERICHIA COLI (A)  Final      Susceptibility   Escherichia coli - MIC*    AMPICILLIN 16 INTERMEDIATE Intermediate     CEFAZOLIN <=4 SENSITIVE Sensitive     CEFEPIME <=0.12 SENSITIVE Sensitive     CEFTRIAXONE <=0.25 SENSITIVE Sensitive     CIPROFLOXACIN >=4 RESISTANT Resistant     GENTAMICIN <=1 SENSITIVE Sensitive     IMIPENEM 1 SENSITIVE Sensitive     NITROFURANTOIN 32 SENSITIVE Sensitive     TRIMETH/SULFA >=320 RESISTANT Resistant     AMPICILLIN/SULBACTAM 8 SENSITIVE Sensitive     PIP/TAZO <=4 SENSITIVE Sensitive     * >=100,000 COLONIES/mL ESCHERICHIA COLI         Radiology Studies: No results found.      Scheduled Meds:  amLODipine  10 mg Oral Daily   apixaban  5 mg Oral BID   atorvastatin  20 mg Oral QPM   cephALEXin  500 mg Oral BID   levETIRAcetam  500 mg Oral BID   pantoprazole  40 mg Oral Daily   tamsulosin  0.4 mg Oral QPC breakfast   traZODone  100 mg Oral QHS   Continuous Infusions:   LOS: 3 days    Time spent: 25 mins     Charise Killian, MD [facility] Pager [phone]  If 7PM-7AM, please contact night-coverage [email or web address] 06/04/2023, 7:38 AM

## 2023-06-04 NOTE — Care Management Important Message (Signed)
Important Message  Patient Details  Name: Richard Carter MRN: [id number] Date of Birth: Jun 30, 1941   Medicare Important Message Given:  N/A - LOS <3 / Initial given by admissions     Olegario Messier A Jadynn Epping 06/04/2023, 8:43 AM

## 2023-06-04 NOTE — Progress Notes (Signed)
OT Cancellation Note  Patient Details Name: Richard Carter MRN: [id number] DOB: [birth year]   Cancelled Treatment:    Reason Eval/Treat Not Completed: Patient declined, no reason specified;Other (comment) (Pt recieved upright in recliner, refusing mobility or self-care. OT offers repositioning, mobility and ADL support but pt continues to decline saying "no" and shaking his head. Will re-attempt as able.)  Victorino Dike L. Hammond Obeirne, OTR/L  06/04/23, 2:28 PM

## 2023-06-05 DIAGNOSIS — I639 Cerebral infarction, unspecified: Secondary | ICD-10-CM | POA: Diagnosis not present

## 2023-06-05 LAB — CBC
HCT: 30.2 % — ABNORMAL LOW (ref 39.0–52.0)
Hemoglobin: 9.9 g/dL — ABNORMAL LOW (ref 13.0–17.0)
MCH: 28.7 pg (ref 26.0–34.0)
MCHC: 32.8 g/dL (ref 30.0–36.0)
MCV: 87.5 fL (ref 80.0–100.0)
Platelets: 237 10*3/uL (ref 150–400)
RBC: 3.45 MIL/uL — ABNORMAL LOW (ref 4.22–5.81)
RDW: 19.9 % — ABNORMAL HIGH (ref 11.5–15.5)
WBC: 8 10*3/uL (ref 4.0–10.5)
nRBC: 0 % (ref 0.0–0.2)

## 2023-06-05 LAB — BASIC METABOLIC PANEL
Anion gap: 8 (ref 5–15)
BUN: 31 mg/dL — ABNORMAL HIGH (ref 8–23)
CO2: 22 mmol/L (ref 22–32)
Calcium: 9.2 mg/dL (ref 8.9–10.3)
Chloride: 107 mmol/L (ref 98–111)
Creatinine, Ser: 1.13 mg/dL (ref 0.61–1.24)
GFR, Estimated: >60 mL/min (ref >60)
Glucose, Bld: 93 mg/dL (ref 70–99)
Potassium: 4 mmol/L (ref 3.5–5.1)
Sodium: 137 mmol/L (ref 135–145)

## 2023-06-05 NOTE — Plan of Care (Signed)
  Problem: Education: Goal: Knowledge of patient specific risk factors will improve Richard Carter N/A or DELETE if not current risk factor) Outcome: Progressing   Problem: Ischemic Stroke/TIA Tissue Perfusion: Goal: Complications of ischemic stroke/TIA will be minimized Outcome: Progressing   Problem: Coping: Goal: Will verbalize positive feelings about self Outcome: Progressing

## 2023-06-05 NOTE — Plan of Care (Signed)
  Problem: Education: Goal: Knowledge of disease or condition will improve Outcome: Progressing Goal: Knowledge of secondary prevention will improve (MUST DOCUMENT ALL) Outcome: Progressing Goal: Knowledge of patient specific risk factors will improve Loraine Leriche N/A or DELETE if not current risk factor) Outcome: Progressing   Problem: Ischemic Stroke/TIA Tissue Perfusion: Goal: Complications of ischemic stroke/TIA will be minimized Outcome: Progressing   Problem: Coping: Goal: Will verbalize positive feelings about self Outcome: Progressing Goal: Will identify appropriate support needs Outcome: Progressing   Problem: Health Behavior/Discharge Planning: Goal: Ability to manage health-related needs will improve Outcome: Progressing Goal: Goals will be collaboratively established with patient/family Outcome: Progressing   Problem: Self-Care: Goal: Ability to participate in self-care as condition permits will improve Outcome: Progressing Goal: Verbalization of feelings and concerns over difficulty with self-care will improve Outcome: Progressing Goal: Ability to communicate needs accurately will improve Outcome: Progressing   Problem: Nutrition: Goal: Risk of aspiration will decrease Outcome: Progressing Goal: Dietary intake will improve Outcome: Progressing   Problem: Education: Goal: Knowledge of disease or condition will improve Outcome: Progressing Goal: Knowledge of secondary prevention will improve (MUST DOCUMENT ALL) Outcome: Progressing Goal: Knowledge of patient specific risk factors will improve Loraine Leriche N/A or DELETE if not current risk factor) Outcome: Progressing   Problem: Ischemic Stroke/TIA Tissue Perfusion: Goal: Complications of ischemic stroke/TIA will be minimized Outcome: Progressing   Problem: Self-Care: Goal: Ability to participate in self-care as condition permits will improve Outcome: Progressing Goal: Verbalization of feelings and concerns over  difficulty with self-care will improve Outcome: Progressing Goal: Ability to communicate needs accurately will improve Outcome: Progressing

## 2023-06-05 NOTE — Progress Notes (Signed)
Physical Therapy Treatment Patient Details Name: Richard Carter MRN: [id number] DOB: 1941/03/04 Today's Date: 06/05/2023   History of Present Illness Pt admitted to [facility] on 05/31/23 under observation for c/o stroke like symptoms including: facial droop, dysarthria, and RLE weakness. Pt outside the window for tPA. Imaging significant for: punctate acute to early subacute infarct in the L internal capsule/basal ganglia, and probable late subacute to early chronic infarct in the L corona radiata. Significant PMH includes: hx CVA, carotid endarterectomy (03/2023), Afib on Eliquis, HTN, hLD, mixed vascular and Alzheimer's dementia, seizure disorder, alcohol use disorder (in remission).    PT Comments  Pt was alert, long sitting in bed, upon arrival. He does present with some cognition concerns with overall poor insight of current situation + poor safety awareness. Remains high fall risk. POA / caregiver/ neighbor [name]) was present throughout session. Pt required extensive assistance to safely exit bed, stand, and ambulate with RW short distance. Per [name]," He is far from his baseline abilities. I can't take care of him at home like this."  [name] requested STR/SNF placement.He was planning to call pt's brother (lives in [place]) to discuss next level of care. Pt was unable to even attempt stairs in which he has to access his home. Acute PT will continue to follow and progress per current POC. DC recs remain appropriate.     Assistance Recommended at Discharge Frequent or constant Supervision/Assistance  If plan is discharge home, recommend the following:  Can travel by private vehicle    A lot of help with walking and/or transfers;Assistance with cooking/housework;Direct supervision/assist for medications management;Direct supervision/assist for financial management;Assist for transportation;Help with stairs or ramp for entrance;A lot of help with bathing/dressing/bathroom      Equipment  Recommendations  Other (comment) (Defer to next level of care)       Precautions / Restrictions Precautions Precautions: Fall Precaution Comments: Aspiration; DNR Restrictions Weight Bearing Restrictions: No     Mobility  Bed Mobility Overal bed mobility: Needs Assistance Bed Mobility: Supine to Sit  Supine to sit: Min assist, Mod assist, HOB elevated  General bed mobility comments: Pt was able to exit L side of bed with HOB elevated. increased time to perform with extensive ssistance required to achieve EOB sitting. sat EOB x ~ 5 minutes prior to standing.    Transfers Overall transfer level: Needs assistance Equipment used: Rolling walker (2 wheels) Transfers: Sit to/from Stand Sit to Stand: Min assist  General transfer comment: Min assist to STS 3 x throughout session. pt cued for improved fwd wt shift and overall technique. per POA [name]) " he is not even close to his baseline abilities."    Ambulation/Gait Ambulation/Gait assistance: Min assist, Mod assist Gait Distance (Feet): 50 Feet Assistive device: Rolling walker (2 wheels) Gait Pattern/deviations: Trunk flexed, Shuffle, Decreased step length - right, Decreased step length - left, Decreased stance time - right, Decreased stance time - left, Decreased stride length, Step-through pattern Gait velocity: decreased  General Gait Details: Pt was able to advance gat to ~ 50 ft with RW however requires constant assistance to prevent falling. Pt severely limited by fatigue. He has stairs to enter home however unsafe to even attempt stairs this date.   Balance Overall balance assessment: Needs assistance Sitting-balance support: No upper extremity supported, Feet supported Sitting balance-Leahy Scale: Fair     Standing balance support: Bilateral upper extremity supported, During functional activity, Reliant on assistive device for balance Standing balance-Leahy Scale: Poor Standing balance comment: pt is  high fall risk       Cognition Arousal/Alertness: Awake/alert Behavior During Therapy: WFL for tasks assessed/performed Overall Cognitive Status: No family/caregiver present to determine baseline cognitive functioning    General Comments: pt is alert and able to follow commands but has poor insight of current situation and poor overall safety awareness. POA present throughout session.               Pertinent Vitals/Pain Pain Assessment Pain Assessment: No/denies pain Faces Pain Scale: No hurt     PT Goals (current goals can now be found in the care plan section) Acute Rehab PT Goals Patient Stated Goal: to go home Progress towards PT goals: Progressing toward goals    Frequency    Min 1X/week      PT Plan Current plan remains appropriate       AM-PAC PT "6 Clicks" Mobility   Outcome Measure  Help needed turning from your back to your side while in a flat bed without using bedrails?: A Little Help needed moving from lying on your back to sitting on the side of a flat bed without using bedrails?: A Lot Help needed moving to and from a bed to a chair (including a wheelchair)?: A Lot Help needed standing up from a chair using your arms (e.g., wheelchair or bedside chair)?: A Lot Help needed to walk in hospital room?: A Lot Help needed climbing 3-5 steps with a railing? : Total 6 Click Score: 12    End of Session Equipment Utilized During Treatment: Gait belt Activity Tolerance: Patient limited by fatigue Patient left: in chair;with chair alarm set;with call bell/phone within reach Nurse Communication: Mobility status PT Visit Diagnosis: Unsteadiness on feet (R26.81);Other abnormalities of gait and mobility (R26.89);Muscle weakness (generalized) (M62.81);History of falling (Z91.81);Difficulty in walking, not elsewhere classified (R26.2);Hemiplegia and hemiparesis Hemiplegia - Right/Left: Right Hemiplegia - caused by: Cerebral infarction     Time: 1140-1202 PT Time Calculation (min)  (ACUTE ONLY): 22 min  Charges:    $Gait Training: 8-22 mins PT General Charges $$ ACUTE PT VISIT: 1 Visit                    Jetta Lout PTA 06/05/23, 12:33 PM

## 2023-06-05 NOTE — Progress Notes (Signed)
PROGRESS NOTE    Richard Carter  ZOX:[id number] DOB: [birth year] DOA: 05/31/2023 PCP: Marisue Ivan, MD   Assessment & Plan:   Principal Problem:   Acute CVA (cerebrovascular accident) [facility]) Active Problems:   UTI (urinary tract infection)   AKI (acute kidney injury) (HCC)   AF (paroxysmal atrial fibrillation) (HCC)   HTN (hypertension)   Seizures (HCC)   Dementia (HCC)  Assessment and Plan: Acute CVA: presented w/ slurred with right leg weakness. Continue on eliquis, statin. PT/OT recs SNF. Pt's POA is now agreeable to SNF. Recent CVA s/p left carotid endarterectomy on 04/02/2023  Likely PAF: continue on eliquis. Not on any rate controlling meds as per med rec   Dysphagia: likely secondary to above. Continue on dysphagia I diet as per speech    Acute UTI: urine culture showed E. coli. Continue on keflex to complete the course   BPH: continue on tamsulosin    HTN: continue on amlodipine   HLD: continue on statin    AKI: Cr is trending down again today. Avoid nephrotoxic meds   Seizure disorder: continue on keppra    Dementia: continue w/ supportive care       DVT prophylaxis: eliquis  Code Status: DNR Family Communication: discussed pt's care w/ pt's POA, [name], and answered his questions Disposition Plan: d/c to SNF   Level of care: Telemetry Medical Status is: Inpatient Remains inpatient appropriate because: needs SNF placement    Consultants:  Neuro   Procedures:   Antimicrobials: keflex   Subjective: Pt c/o fatigue   Objective: Vitals:   06/04/23 1727 06/04/23 2003 06/05/23 0005 06/05/23 0316  BP: 127/69 131/65 (!) 147/74 (!) 155/76  Pulse: 76 61 60 61  Resp: 16   17  Temp: (!) 97.5 F (36.4 C) 98.6 F (37 C) 97.9 F (36.6 C) 98.6 F (37 C)  TempSrc: Oral Oral Oral Oral  SpO2: 95% 95% 94% 93%  Weight:      Height:        Intake/Output Summary (Last 24 hours) at 06/05/2023 0813 Last data filed at 06/04/2023 2158 Gross per 24 hour   Intake --  Output 900 ml  Net -900 ml   Filed Weights   05/31/23 1034  Weight: 74.8 kg    Examination:  General exam: Appears comfortable  Respiratory system: clear breath sounds b/l. Cardiovascular system: S1/S2+. No rubs or clicks  Gastrointestinal system: Abd is soft, NT, ND & hypoactive bowel sounds  Central nervous system: Alert & awake  Psychiatry: judgement and insight appears at baseline. Flat mood and affect     Data Reviewed: I have personally reviewed following labs and imaging studies  CBC: Recent Labs  Lab 05/31/23 1131 06/01/23 0710 06/05/23 0343  WBC 7.9 7.3 8.0  NEUTROABS 5.3 5.4  --   HGB 11.0* 10.2* 9.9*  HCT 34.5* 31.9* 30.2*  MCV 89.6 87.9 87.5  PLT 301 294 237   Basic Metabolic Panel: Recent Labs  Lab 05/31/23 1131 06/01/23 0710 06/05/23 0343  NA 137 139 137  K 3.6 4.0 4.0  CL 109 109 107  CO2 21* 22 22  GLUCOSE 98 94 93  BUN 20 21 31*  CREATININE 1.32* 1.17 1.13  CALCIUM 9.3 8.9 9.2   GFR: Estimated Creatinine Clearance: 46.3 mL/min (by C-G formula based on SCr of 1.13 mg/dL). Liver Function Tests: Recent Labs  Lab 05/31/23 1131  AST 19  ALT 12  ALKPHOS 68  BILITOT 0.5  PROT 7.1  ALBUMIN 3.6  No results for input(s): "LIPASE", "AMYLASE" in the last 168 hours. No results for input(s): "AMMONIA" in the last 168 hours. Coagulation Profile: No results for input(s): "INR", "PROTIME" in the last 168 hours. Cardiac Enzymes: No results for input(s): "CKTOTAL", "CKMB", "CKMBINDEX", "TROPONINI" in the last 168 hours. BNP (last 3 results) No results for input(s): "PROBNP" in the last 8760 hours. HbA1C: No results for input(s): "HGBA1C" in the last 72 hours. CBG: Recent Labs  Lab 05/31/23 1033  GLUCAP 92   Lipid Profile: No results for input(s): "CHOL", "HDL", "LDLCALC", "TRIG", "CHOLHDL", "LDLDIRECT" in the last 72 hours. Thyroid Function Tests: No results for input(s): "TSH", "T4TOTAL", "FREET4", "T3FREE", "THYROIDAB" in  the last 72 hours. Anemia Panel: No results for input(s): "VITAMINB12", "FOLATE", "FERRITIN", "TIBC", "IRON", "RETICCTPCT" in the last 72 hours. Sepsis Labs: No results for input(s): "PROCALCITON", "LATICACIDVEN" in the last 168 hours.  Recent Results (from the past 240 hour(s))  Urine Culture     Status: Abnormal   Collection Time: 05/31/23 11:58 AM   Specimen: Urine, Clean Catch  Result Value Ref Range Status   Specimen Description   Final    URINE, CLEAN CATCH Performed at [facility], [address] [place], [place] [address]    Special Requests   Final    NONE Performed at [facility], [address] [place], [place] [address]    Culture >=100,000 COLONIES/mL ESCHERICHIA COLI (A)  Final   Report Status 06/02/2023 FINAL  Final   Organism ID, Bacteria ESCHERICHIA COLI (A)  Final      Susceptibility   Escherichia coli - MIC*    AMPICILLIN 16 INTERMEDIATE Intermediate     CEFAZOLIN <=4 SENSITIVE Sensitive     CEFEPIME <=0.12 SENSITIVE Sensitive     CEFTRIAXONE <=0.25 SENSITIVE Sensitive     CIPROFLOXACIN >=4 RESISTANT Resistant     GENTAMICIN <=1 SENSITIVE Sensitive     IMIPENEM 1 SENSITIVE Sensitive     NITROFURANTOIN 32 SENSITIVE Sensitive     TRIMETH/SULFA >=320 RESISTANT Resistant     AMPICILLIN/SULBACTAM 8 SENSITIVE Sensitive     PIP/TAZO <=4 SENSITIVE Sensitive     * >=100,000 COLONIES/mL ESCHERICHIA COLI         Radiology Studies: No results found.      Scheduled Meds:  amLODipine  10 mg Oral Daily   apixaban  5 mg Oral BID   atorvastatin  20 mg Oral QPM   cephALEXin  500 mg Oral BID   levETIRAcetam  500 mg Oral BID   pantoprazole  40 mg Oral Daily   tamsulosin  0.4 mg Oral QPC breakfast   traZODone  100 mg Oral QHS   Continuous Infusions:   LOS: 4 days    Time spent: 25 mins     Charise Killian, MD [facility] Pager [phone]  If 7PM-7AM, please contact night-coverage [email or web address] 06/05/2023, 8:13  AM

## 2023-06-05 NOTE — Progress Notes (Signed)
SLP Cancellation Note  Patient Details Name: Richard Carter MRN: [id number] DOB: 1941-11-20   Cancelled treatment:       Reason Eval/Treat Not Completed: Fatigue/lethargy limiting ability to participate (Pt declined participation due to c/o feeling "tired." SLP to continue efforts as appropriate.)  Clyde Canterbury, M.S., CCC-SLP Speech-Language Pathologist [facility] [phone] Richard Carter)  Richard Carter 06/05/2023, 8:57 AM

## 2023-06-05 NOTE — Plan of Care (Signed)
Problem: Education: Goal: Knowledge of disease or condition will improve 06/05/2023 0446 by Barrie Folk, RN Outcome: Progressing 06/05/2023 0445 by Barrie Folk, RN Outcome: Progressing Goal: Knowledge of secondary prevention will improve (MUST DOCUMENT ALL) 06/05/2023 0446 by Barrie Folk, RN Outcome: Progressing 06/05/2023 0445 by Barrie Folk, RN Outcome: Progressing Goal: Knowledge of patient specific risk factors will improve Loraine Leriche N/A or DELETE if not current risk factor) 06/05/2023 0446 by Barrie Folk, RN Outcome: Progressing 06/05/2023 0445 by Barrie Folk, RN Outcome: Progressing   Problem: Ischemic Stroke/TIA Tissue Perfusion: Goal: Complications of ischemic stroke/TIA will be minimized 06/05/2023 0446 by Barrie Folk, RN Outcome: Progressing 06/05/2023 0445 by Barrie Folk, RN Outcome: Progressing   Problem: Coping: Goal: Will verbalize positive feelings about self 06/05/2023 0446 by Barrie Folk, RN Outcome: Progressing 06/05/2023 0445 by Barrie Folk, RN Outcome: Progressing Goal: Will identify appropriate support needs 06/05/2023 0446 by Barrie Folk, RN Outcome: Progressing 06/05/2023 0445 by Barrie Folk, RN Outcome: Progressing   Problem: Health Behavior/Discharge Planning: Goal: Ability to manage health-related needs will improve 06/05/2023 0446 by Barrie Folk, RN Outcome: Progressing 06/05/2023 0445 by Barrie Folk, RN Outcome: Progressing Goal: Goals will be collaboratively established with patient/family 06/05/2023 0446 by Barrie Folk, RN Outcome: Progressing 06/05/2023 0445 by Barrie Folk, RN Outcome: Progressing   Problem: Health Behavior/Discharge Planning: Goal: Ability to manage health-related needs will improve 06/05/2023 0446 by Barrie Folk, RN Outcome: Progressing 06/05/2023 0445 by Barrie Folk, RN Outcome: Progressing Goal: Goals will be collaboratively established with patient/family 06/05/2023 0446 by Barrie Folk, RN Outcome: Progressing 06/05/2023 0445 by Barrie Folk, RN Outcome: Progressing   Problem: Self-Care: Goal: Ability to participate in self-care as condition permits will improve 06/05/2023 0446 by Barrie Folk, RN Outcome: Progressing 06/05/2023 0445 by Barrie Folk, RN Outcome: Progressing Goal: Verbalization of feelings and concerns over difficulty with self-care will improve 06/05/2023 0446 by Barrie Folk, RN Outcome: Progressing 06/05/2023 0445 by Barrie Folk, RN Outcome: Progressing Goal: Ability to communicate needs accurately will improve 06/05/2023 0446 by Barrie Folk, RN Outcome: Progressing 06/05/2023 0445 by Barrie Folk, RN Outcome: Progressing   Problem: Nutrition: Goal: Risk of aspiration will decrease 06/05/2023 0446 by Barrie Folk, RN Outcome: Progressing 06/05/2023 0445 by Barrie Folk, RN Outcome: Progressing Goal: Dietary intake will improve 06/05/2023 0446 by Barrie Folk, RN Outcome: Progressing 06/05/2023 0445 by Barrie Folk, RN Outcome: Progressing   Problem: Education: Goal: Knowledge of disease or condition will improve 06/05/2023 0446 by Barrie Folk, RN Outcome: Progressing 06/05/2023 0445 by Barrie Folk, RN Outcome: Progressing Goal: Knowledge of secondary prevention will improve (MUST DOCUMENT ALL) 06/05/2023 0446 by Barrie Folk, RN Outcome: Progressing 06/05/2023 0445 by Barrie Folk, RN Outcome: Progressing Goal: Knowledge of patient specific risk factors will improve Loraine Leriche N/A or DELETE if not current risk factor) 06/05/2023 0446 by Barrie Folk, RN Outcome: Progressing 06/05/2023 0445 by Barrie Folk, RN Outcome: Progressing   Problem: Ischemic  Stroke/TIA Tissue Perfusion: Goal: Complications of ischemic stroke/TIA will be minimized 06/05/2023 0446 by Barrie Folk, RN Outcome: Progressing 06/05/2023 0445 by Barrie Folk, RN Outcome: Progressing   Problem: Coping: Goal: Will verbalize positive feelings about self 06/05/2023 0446 by Barrie Folk, RN Outcome: Progressing 06/05/2023 0445 by Barrie Folk, RN Outcome: Progressing Goal: Will identify appropriate support needs 06/05/2023 0446  by Barrie Folk, RN Outcome: Progressing 06/05/2023 0445 by Barrie Folk, RN Outcome: Progressing   Problem: Health Behavior/Discharge Planning: Goal: Ability to manage health-related needs will improve 06/05/2023 0446 by Barrie Folk, RN Outcome: Progressing 06/05/2023 0445 by Barrie Folk, RN Outcome: Progressing Goal: Goals will be collaboratively established with patient/family 06/05/2023 0446 by Barrie Folk, RN Outcome: Progressing 06/05/2023 0445 by Barrie Folk, RN Outcome: Progressing   Problem: Self-Care: Goal: Ability to participate in self-care as condition permits will improve 06/05/2023 0446 by Barrie Folk, RN Outcome: Progressing 06/05/2023 0445 by Barrie Folk, RN Outcome: Progressing Goal: Verbalization of feelings and concerns over difficulty with self-care will improve 06/05/2023 0446 by Barrie Folk, RN Outcome: Progressing 06/05/2023 0445 by Barrie Folk, RN Outcome: Progressing Goal: Ability to communicate needs accurately will improve 06/05/2023 0446 by Barrie Folk, RN Outcome: Progressing 06/05/2023 0445 by Barrie Folk, RN Outcome: Progressing   Problem: Nutrition: Goal: Risk of aspiration will decrease 06/05/2023 0446 by Barrie Folk, RN Outcome: Progressing 06/05/2023 0445 by Barrie Folk, RN Outcome: Progressing Goal: Dietary intake will  improve 06/05/2023 0446 by Barrie Folk, RN Outcome: Progressing 06/05/2023 0445 by Barrie Folk, RN Outcome: Progressing   Problem: Education: Goal: Knowledge of General Education information will improve Description: Including pain rating scale, medication(s)/side effects and non-pharmacologic comfort measures 06/05/2023 0446 by Barrie Folk, RN Outcome: Progressing 06/05/2023 0445 by Barrie Folk, RN Outcome: Progressing   Problem: Health Behavior/Discharge Planning: Goal: Ability to manage health-related needs will improve 06/05/2023 0446 by Barrie Folk, RN Outcome: Progressing 06/05/2023 0445 by Barrie Folk, RN Outcome: Progressing   Problem: Activity: Goal: Risk for activity intolerance will decrease 06/05/2023 0446 by Barrie Folk, RN Outcome: Progressing 06/05/2023 0445 by Barrie Folk, RN Outcome: Progressing   Problem: Nutrition: Goal: Adequate nutrition will be maintained 06/05/2023 0446 by Barrie Folk, RN Outcome: Progressing 06/05/2023 0445 by Barrie Folk, RN Outcome: Progressing   Problem: Coping: Goal: Level of anxiety will decrease 06/05/2023 0446 by Barrie Folk, RN Outcome: Progressing 06/05/2023 0445 by Barrie Folk, RN Outcome: Progressing   Problem: Pain Managment: Goal: General experience of comfort will improve 06/05/2023 0446 by Barrie Folk, RN Outcome: Progressing 06/05/2023 0445 by Barrie Folk, RN Outcome: Progressing   Problem: Safety: Goal: Ability to remain free from injury will improve 06/05/2023 0446 by Barrie Folk, RN Outcome: Progressing 06/05/2023 0445 by Barrie Folk, RN Outcome: Progressing   Problem: Skin Integrity: Goal: Risk for impaired skin integrity will decrease 06/05/2023 0446 by Barrie Folk, RN Outcome: Progressing 06/05/2023 0445 by Barrie Folk, RN Outcome:  Progressing

## 2023-06-05 NOTE — Care Management Important Message (Signed)
Important Message  Patient Details  Name: Richard Carter MRN: [id number] Date of Birth: 1941-02-26   Medicare Important Message Given:  N/A - LOS <3 / Initial given by admissions     Olegario Messier [name] 06/05/2023, 8:59 AM

## 2023-06-06 DIAGNOSIS — I639 Cerebral infarction, unspecified: Secondary | ICD-10-CM | POA: Diagnosis not present

## 2023-06-06 LAB — BASIC METABOLIC PANEL
Anion gap: 7 (ref 5–15)
BUN: 32 mg/dL — ABNORMAL HIGH (ref 8–23)
CO2: 22 mmol/L (ref 22–32)
Calcium: 9.2 mg/dL (ref 8.9–10.3)
Chloride: 106 mmol/L (ref 98–111)
Creatinine, Ser: 1.15 mg/dL (ref 0.61–1.24)
GFR, Estimated: >60 mL/min (ref >60)
Glucose, Bld: 92 mg/dL (ref 70–99)
Potassium: 4.1 mmol/L (ref 3.5–5.1)
Sodium: 135 mmol/L (ref 135–145)

## 2023-06-06 LAB — CBC
HCT: 31.7 % — ABNORMAL LOW (ref 39.0–52.0)
Hemoglobin: 10.4 g/dL — ABNORMAL LOW (ref 13.0–17.0)
MCH: 28.7 pg (ref 26.0–34.0)
MCHC: 32.8 g/dL (ref 30.0–36.0)
MCV: 87.3 fL (ref 80.0–100.0)
Platelets: 261 10*3/uL (ref 150–400)
RBC: 3.63 MIL/uL — ABNORMAL LOW (ref 4.22–5.81)
RDW: 20 % — ABNORMAL HIGH (ref 11.5–15.5)
WBC: 9.7 10*3/uL (ref 4.0–10.5)
nRBC: 0 % (ref 0.0–0.2)

## 2023-06-06 NOTE — Plan of Care (Signed)
  Problem: Education: Goal: Knowledge of disease or condition will improve 06/06/2023 0433 by Barrie Folk, RN Outcome: Progressing 06/06/2023 0432 by Barrie Folk, RN Outcome: Progressing Goal: Knowledge of secondary prevention will improve (MUST DOCUMENT ALL) 06/06/2023 0433 by Barrie Folk, RN Outcome: Progressing 06/06/2023 0432 by Barrie Folk, RN Outcome: Progressing Goal: Knowledge of patient specific risk factors will improve Loraine Leriche N/A or DELETE if not current risk factor) 06/06/2023 0433 by Barrie Folk, RN Outcome: Progressing 06/06/2023 0432 by Barrie Folk, RN Outcome: Progressing   Problem: Ischemic Stroke/TIA Tissue Perfusion: Goal: Complications of ischemic stroke/TIA will be minimized 06/06/2023 0433 by Barrie Folk, RN Outcome: Progressing 06/06/2023 0432 by Barrie Folk, RN Outcome: Progressing   Problem: Health Behavior/Discharge Planning: Goal: Ability to manage health-related needs will improve Outcome: Progressing Goal: Goals will be collaboratively established with patient/family Outcome: Progressing   Problem: Self-Care: Goal: Ability to participate in self-care as condition permits will improve 06/06/2023 0433 by Barrie Folk, RN Outcome: Progressing 06/06/2023 0432 by Barrie Folk, RN Outcome: Progressing Goal: Verbalization of feelings and concerns over difficulty with self-care will improve 06/06/2023 0433 by Barrie Folk, RN Outcome: Progressing 06/06/2023 0432 by Barrie Folk, RN Outcome: Progressing Goal: Ability to communicate needs accurately will improve 06/06/2023 0433 by Barrie Folk, RN Outcome: Progressing 06/06/2023 0432 by Barrie Folk, RN Outcome: Progressing   Problem: Education: Goal: Knowledge of disease or condition will improve 06/06/2023 0433 by Barrie Folk, RN Outcome: Progressing 06/06/2023 0432 by  Barrie Folk, RN Outcome: Progressing Goal: Knowledge of secondary prevention will improve (MUST DOCUMENT ALL) 06/06/2023 0433 by Barrie Folk, RN Outcome: Progressing 06/06/2023 0432 by Barrie Folk, RN Outcome: Progressing Goal: Knowledge of patient specific risk factors will improve Loraine Leriche N/A or DELETE if not current risk factor) 06/06/2023 0433 by Barrie Folk, RN Outcome: Progressing 06/06/2023 0432 by Barrie Folk, RN Outcome: Progressing   Problem: Ischemic Stroke/TIA Tissue Perfusion: Goal: Complications of ischemic stroke/TIA will be minimized Outcome: Progressing   Problem: Coping: Goal: Level of anxiety will decrease Outcome: Progressing   Problem: Elimination: Goal: Will not experience complications related to bowel motility Outcome: Progressing Goal: Will not experience complications related to urinary retention Outcome: Progressing

## 2023-06-06 NOTE — Progress Notes (Signed)
Physical Therapy Treatment Patient Details Name: Richard Carter MRN: [id number] DOB: 13-Sep-1941 Today's Date: 06/06/2023   History of Present Illness Pt admitted to [facility] on 05/31/23 under observation for c/o stroke like symptoms including: facial droop, dysarthria, and RLE weakness. Pt outside the window for tPA. Imaging significant for: punctate acute to early subacute infarct in the L internal capsule/basal ganglia, and probable late subacute to early chronic infarct in the L corona radiata. Significant PMH includes: hx CVA, carotid endarterectomy (03/2023), Afib on Eliquis, HTN, hLD, mixed vascular and Alzheimer's dementia, seizure disorder, alcohol use disorder (in remission).    PT Comments  Pt was asleep upon arrival. He easily awakes and is cooperative however still has poor insight of current situation. Endorses not feeling well and hurting all over however fully participates in all activity during session. He requires extensive assistance to exit L side of bed. He then was able to stand with min assist and tolerate ambulation with RW around bed to recliner. Pt endorses fatigue and requested to sit. Performed several exercises in chair prior to conclusion of session. Pt is far from his baseline. Will greatly benefit from continued skilled PT to maximize independence and safety with all ADLs. DC recs remain appropriate.     Assistance Recommended at Discharge Frequent or constant Supervision/Assistance  If plan is discharge home, recommend the following:  Can travel by private vehicle    A lot of help with walking and/or transfers;Assistance with cooking/housework;Direct supervision/assist for medications management;Direct supervision/assist for financial management;Assist for transportation;Help with stairs or ramp for entrance;A lot of help with bathing/dressing/bathroom      Equipment Recommendations  Other (comment) (Defer to next level of care)       Precautions / Restrictions  Precautions Precautions: Fall Precaution Comments: Aspiration; DNR Restrictions Weight Bearing Restrictions: No     Mobility  Bed Mobility Overal bed mobility: Needs Assistance Bed Mobility: Supine to Sit  Supine to sit: Min assist, Mod assist, HOB elevated  General bed mobility comments: Vcs and assistance for improved technique    Transfers Overall transfer level: Needs assistance Equipment used: Rolling walker (2 wheels) Transfers: Sit to/from Stand Sit to Stand: Min assist  General transfer comment: Min assist to stand from EOB to RW    Ambulation/Gait Ambulation/Gait assistance: Min assist, Mod assist Gait Distance (Feet): 12 Feet Assistive device: Rolling walker (2 wheels) Gait Pattern/deviations: Trunk flexed, Shuffle, Decreased step length - right, Decreased step length - left, Decreased stance time - right, Decreased stance time - left, Decreased stride length, Step-through pattern Gait velocity: decreased  General Gait Details: pt was able to ambulate around the bed to recliner but required constant assistance and vcs for improved gait quality. Pt refused further ambulation at this time   Balance Overall balance assessment: Needs assistance Sitting-balance support: No upper extremity supported, Feet supported Sitting balance-Leahy Scale: Fair     Standing balance support: Bilateral upper extremity supported, During functional activity, Reliant on assistive device for balance Standing balance-Leahy Scale: Poor Standing balance comment: pt is high fall risk      Cognition Arousal/Alertness: Lethargic Behavior During Therapy: Flat affect Overall Cognitive Status: No family/caregiver present to determine baseline cognitive functioning    General Comments: pt is alert and able to follow commands but has poor insight of current situation and poor overall safety awareness.               Pertinent Vitals/Pain Pain Assessment Pain Assessment: PAINAD Breathing:  normal Negative Vocalization: occasional moan/groan,  low speech, negative/disapproving quality Facial Expression: smiling or inexpressive Body Language: tense, distressed pacing, fidgeting Consolability: distracted or reassured by voice/touch PAINAD Score: 3 Pain Location: "all over." Pain Descriptors / Indicators: Discomfort Pain Intervention(s): Limited activity within patient's tolerance, Monitored during session, Repositioned     PT Goals (current goals can now be found in the care plan section) Acute Rehab PT Goals Patient Stated Goal: none stated Progress towards PT goals: Progressing toward goals    Frequency    Min 1X/week      PT Plan Current plan remains appropriate       AM-PAC PT "6 Clicks" Mobility   Outcome Measure  Help needed turning from your back to your side while in a flat bed without using bedrails?: A Little Help needed moving from lying on your back to sitting on the side of a flat bed without using bedrails?: A Lot Help needed moving to and from a bed to a chair (including a wheelchair)?: A Lot Help needed standing up from a chair using your arms (e.g., wheelchair or bedside chair)?: A Lot Help needed to walk in hospital room?: A Lot Help needed climbing 3-5 steps with a railing? : A Lot 6 Click Score: 13    End of Session   Activity Tolerance: Patient limited by fatigue Patient left: in chair;with chair alarm set;with call bell/phone within reach Nurse Communication: Mobility status PT Visit Diagnosis: Unsteadiness on feet (R26.81);Other abnormalities of gait and mobility (R26.89);Muscle weakness (generalized) (M62.81);History of falling (Z91.81);Difficulty in walking, not elsewhere classified (R26.2);Hemiplegia and hemiparesis Hemiplegia - Right/Left: Right     Time: 1610-9604 PT Time Calculation (min) (ACUTE ONLY): 12 min  Charges:    $Therapeutic Activity: 8-22 mins PT General Charges $$ ACUTE PT VISIT: 1 Visit                      Jetta Lout PTA 06/06/23, 9:31 AM

## 2023-06-06 NOTE — Care Management Important Message (Signed)
Important Message  Patient Details  Name: Richard Carter MRN: [id number] Date of Birth: 05-17-1941   Medicare Important Message Given:  Yes  I reviewed the Important Message from Medicare with the patients POA, Renaldo Reel [phone] and he stated he understood his rights. I thanked him for his time.   Olegario Messier A Gentry Seeber 06/06/2023, 1:28 PM

## 2023-06-06 NOTE — NC FL2 (Signed)
[place] MEDICAID FL2 LEVEL OF CARE FORM     IDENTIFICATION  Patient Name: Richard Carter Birthdate: 1941-03-20 Sex: male Admission Date (Current Location): 05/31/2023  [facility] and [place] Number:  Chiropodist and Address:  [facility], [address] [place], [place] [address]      Provider Number: [id number]  Attending Physician Name and Address:  Charise Killian, MD  Relative Name and Phone Number:  Renaldo Reel [facility]) [phone]    Current Level of Care: Hospital Recommended Level of Care: Skilled Nursing Facility Prior Approval Number:    Date Approved/Denied:   PASRR Number: [id number]  Discharge Plan: SNF    Current Diagnoses: Patient Active Problem List   Diagnosis Date Noted   History of alcohol use disorder 05/31/2023   Goals of care, counseling/discussion 04/03/2023   Malnutrition of moderate degree 04/03/2023   Stroke (HCC) 03/27/2023   Stenosis of left carotid artery 02/20/2023   Altered mental status 02/20/2023   Acute stroke due to ischemia (HCC) 02/17/2023   Stroke-like symptom 02/16/2023   Leukocytosis 02/16/2023   Anemia of chronic disease 07/22/2022   Protein-calorie malnutrition, severe 05/28/2022   Dysphagia due to recent cerebrovascular accident 05/28/2022   Acute metabolic encephalopathy 05/27/2022   History of stroke 05/27/2022   Aspiration pneumonia (HCC) 04/23/2022   Nicotine dependence    Elevated troponin    Acute CVA (cerebrovascular accident) (HCC) 05/07/2021   Non compliance w medication regimen 05/07/2021   Dementia (HCC) 05/07/2021   AF (paroxysmal atrial fibrillation) (HCC) 05/07/2021   HTN (hypertension) 11/29/2020   Alcohol abuse 11/29/2020   AKI (acute kidney injury) (HCC) 11/29/2020   Atrial flutter with rapid ventricular response (HCC) 11/29/2020   Seizures (HCC) 02/22/2020   Hypomagnesemia 10/29/2019   Pubic ramus fracture (HCC) 07/29/2019   Acetabular fracture (HCC)  07/29/2019   HLD (hyperlipidemia) 07/29/2019   GERD (gastroesophageal reflux disease) 07/29/2019   UTI (urinary tract infection) 07/29/2019   Vitamin D deficiency 09/29/2015    Orientation RESPIRATION BLADDER Height & Weight     Self, Time, Situation, Place  Normal Incontinent Weight: 74.8 kg Height:  5\' 7"  (170.2 cm)  BEHAVIORAL SYMPTOMS/MOOD NEUROLOGICAL BOWEL NUTRITION STATUS    Convulsions/Seizures Continent  (See Discharge Summary)  AMBULATORY STATUS COMMUNICATION OF NEEDS Skin   Extensive Assist Verbally Normal                       Personal Care Assistance Level of Assistance  Bathing, Feeding, Dressing Bathing Assistance: Maximum assistance Feeding assistance: Limited assistance Dressing Assistance: Maximum assistance     Functional Limitations Info  Hearing, Speech, Sight Sight Info: Impaired (Wears glasses) Hearing Info: Adequate Speech Info: Adequate    SPECIAL CARE FACTORS FREQUENCY  PT (By licensed PT), OT (By licensed OT), Speech therapy     PT Frequency: 5x weekly OT Frequency: 5x weekly     Speech Therapy Frequency: 2-3 weekly      Contractures Contractures Info: Not present    Additional Factors Info  Code Status, Allergies Code Status Info: DNR Allergies Info: No Known Allergies           Current Medications (06/06/2023):  This is the current hospital active medication list Current Facility-Administered Medications  Medication Dose Route Frequency Provider Last Rate Last Admin   acetaminophen (TYLENOL) tablet 650 mg  650 mg Oral Q4H PRN Verdene Lennert, MD       Or   acetaminophen (TYLENOL) 160 MG/5ML solution 650  mg  650 mg Per Tube Q4H PRN Verdene Lennert, MD       Or   acetaminophen (TYLENOL) suppository 650 mg  650 mg Rectal Q4H PRN Verdene Lennert, MD       amLODipine (NORVASC) tablet 10 mg  10 mg Oral Daily Lurene Shadow, MD   10 mg at 06/06/23 1610   apixaban (ELIQUIS) tablet 5 mg  5 mg Oral BID Verdene Lennert, MD   5 mg at  06/06/23 0937   atorvastatin (LIPITOR) tablet 20 mg  20 mg Oral QPM Verdene Lennert, MD   20 mg at 06/05/23 1757   cephALEXin (KEFLEX) capsule 500 mg  500 mg Oral BID Lurene Shadow, MD   500 mg at 06/06/23 0937   levETIRAcetam (KEPPRA) tablet 500 mg  500 mg Oral BID Verdene Lennert, MD   500 mg at 06/06/23 0937   pantoprazole (PROTONIX) EC tablet 40 mg  40 mg Oral Daily Verdene Lennert, MD   40 mg at 06/06/23 9604   senna-docusate (Senokot-S) tablet 1 tablet  1 tablet Oral QHS PRN Verdene Lennert, MD   1 tablet at 06/05/23 2116   tamsulosin (FLOMAX) capsule 0.4 mg  0.4 mg Oral QPC breakfast Enedina Finner, MD   0.4 mg at 06/06/23 5409   traZODone (DESYREL) tablet 100 mg  100 mg Oral QHS Verdene Lennert, MD   100 mg at 06/05/23 2057     Discharge Medications: Please see discharge summary for a list of discharge medications.  Relevant Imaging Results:  Relevant Lab Results:   Additional Information [id number]  Garret Reddish, RN

## 2023-06-06 NOTE — Progress Notes (Signed)
Occupational Therapy Treatment Patient Details Name: Richard Carter MRN: [id number] DOB: November 10, 1941 Today's Date: 06/06/2023   History of present illness Pt admitted to [facility] on 05/31/23 under observation for c/o stroke like symptoms including: facial droop, dysarthria, and RLE weakness. Pt outside the window for tPA. Imaging significant for: punctate acute to early subacute infarct in the L internal capsule/basal ganglia, and probable late subacute to early chronic infarct in the L corona radiata. Significant PMH includes: hx CVA, carotid endarterectomy (03/2023), Afib on Eliquis, HTN, hLD, mixed vascular and Alzheimer's dementia, seizure disorder, alcohol use disorder (in remission).   OT comments  Pt seen for OT tx. Pt in recliner, declines walk or ADL but wants to return to bed to rest. Pt requires MIN-MOD A for STS from recliner with RW and VC for hand placement, MIN A for step pivot with assist for directing RW to return to bed. Pt continues to benefit from skilled OT Services. Currently recommendation remains appropriate.    Recommendations for follow up therapy are one component of a multi-disciplinary discharge planning process, led by the attending physician.  Recommendations may be updated based on patient status, additional functional criteria and insurance authorization.    Assistance Recommended at Discharge Frequent or constant Supervision/Assistance  Patient can return home with the following  A lot of help with walking and/or transfers;A lot of help with bathing/dressing/bathroom;Assistance with cooking/housework;Direct supervision/assist for medications management;Direct supervision/assist for financial management;Assist for transportation;Help with stairs or ramp for entrance   Equipment Recommendations  Other (comment) (defer to next venue)    Recommendations for Other Services      Precautions / Restrictions Precautions Precautions: Fall Precaution Comments: Aspiration;  DNR Restrictions Weight Bearing Restrictions: No       Mobility Bed Mobility Overal bed mobility: Needs Assistance Bed Mobility: Sit to Supine       Sit to supine: Supervision, Min guard   General bed mobility comments: increased time/effort    Transfers Overall transfer level: Needs assistance Equipment used: Rolling walker (2 wheels) Transfers: Sit to/from Stand, Bed to chair/wheelchair/BSC Sit to Stand: Min assist, Mod assist     Step pivot transfers: Min assist     General transfer comment: VC for hand placement     Balance Overall balance assessment: Needs assistance Sitting-balance support: No upper extremity supported, Feet supported Sitting balance-Leahy Scale: Fair     Standing balance support: Bilateral upper extremity supported, During functional activity, Reliant on assistive device for balance Standing balance-Leahy Scale: Poor                             ADL either performed or assessed with clinical judgement   ADL                                              Extremity/Trunk Assessment              Vision       Perception     Praxis      Cognition Arousal/Alertness: Lethargic Behavior During Therapy: Flat affect Overall Cognitive Status: No family/caregiver present to determine baseline cognitive functioning                                 General Comments: cues for  following simple commands        Exercises      Shoulder Instructions       General Comments      Pertinent Vitals/ Pain       Pain Assessment Pain Assessment: No/denies pain  Home Living                                          Prior Functioning/Environment              Frequency  Min 1X/week        Progress Toward Goals  OT Goals(current goals can now be found in the care plan section)  Progress towards OT goals: Progressing toward goals  Acute Rehab OT Goals Patient  Stated Goal: go home OT Goal Formulation: Patient unable to participate in goal setting Time For Goal Achievement: 06/15/23 Potential to Achieve Goals: Fair  Plan Discharge plan remains appropriate;Frequency remains appropriate    Co-evaluation                 AM-PAC OT "6 Clicks" Daily Activity     Outcome Measure   Help from another person eating meals?: None Help from another person taking care of personal grooming?: A Little Help from another person toileting, which includes using toliet, bedpan, or urinal?: A Lot Help from another person bathing (including washing, rinsing, drying)?: A Lot Help from another person to put on and taking off regular upper body clothing?: A Lot Help from another person to put on and taking off regular lower body clothing?: A Lot 6 Click Score: 15    End of Session Equipment Utilized During Treatment: Gait belt;Rolling walker (2 wheels)  OT Visit Diagnosis: Unsteadiness on feet (R26.81)   Activity Tolerance Patient tolerated treatment well   Patient Left in bed;with call bell/phone within reach;with bed alarm set   Nurse Communication          Time: 9147-8295 OT Time Calculation (min): 10 min  Charges: OT General Charges $OT Visit: 1 Visit OT Treatments $Self Care/Home Management : 8-22 mins  Arman Filter., MPH, MS, OTR/L ascom [phone] 06/06/23, 2:50 PM

## 2023-06-06 NOTE — TOC Progression Note (Signed)
Transition of Care [facility]) - Progression Note    Patient Details  Name: Richard Carter MRN: [id number] Date of Birth: [birth year]  Transition of Care [facility]) CM/SW Contact  Garret Reddish, RN Phone Number: 06/06/2023, 11:03 AM  Clinical Narrative:   I have spoken with Renaldo Reel (POA).  [name] was able to attend a PT session on yesterday.  [name] would now like to consider Skilled Nursing for short term rehab.  [name] would need patient to be stronger in order for him to assist with his needs at home.   [name] reports that patient has been to [facility] and he would like for him to return to go to [facility] if possible.  [name] is agreeable to a  SNF bed search in the [place] area.  I have completed bed search to [facility] area SNFs.    I have informed [name] that patient may be in his co-pays days and may require payment to SNF prior to discharging to SNF.    TOC will continue to follow for discharge planning.      Expected Discharge Plan: Home w Home Health Services Barriers to Discharge: No Barriers Identified  Expected Discharge Plan and Services   Discharge Planning Services: CM Consult Post Acute Care Choice: Home Health Living arrangements for the past 2 months: Single Family Home                   DME Agency:  (Patient has a BSC, wheelchair and 2 wheeled rollling walker at home.)       HH Arranged:  (Patient is active with [facility])           Social Determinants of Health (SDOH) Interventions SDOH Screenings   Food Insecurity: Patient Declined (05/31/2023)  Housing: Patient Declined (05/31/2023)  Transportation Needs: Patient Declined (05/31/2023)  Utilities: Patient Declined (05/31/2023)  Tobacco Use: Medium Risk (05/31/2023)    Readmission Risk Interventions    04/21/2022    1:55 PM  Readmission Risk Prevention Plan  Post Dischage Appt Complete  Medication Screening Complete  Transportation Screening  Complete

## 2023-06-06 NOTE — Progress Notes (Signed)
PROGRESS NOTE    Richard Carter  WUJ:[id number] DOB: 1941/05/28 DOA: 05/31/2023 PCP: Marisue Ivan, MD   Assessment & Plan:   Principal Problem:   Acute CVA (cerebrovascular accident) [facility]) Active Problems:   UTI (urinary tract infection)   AKI (acute kidney injury) (HCC)   AF (paroxysmal atrial fibrillation) (HCC)   HTN (hypertension)   Seizures (HCC)   Dementia (HCC)  Assessment and Plan: Acute CVA: presented w/ slurred with right leg weakness. Continue on eliquis, statin. PT/OT recs SNF. Recent CVA s/p left carotid endarterectomy on 04/02/2023. Needs SNF placement   Likely PAF: continue on eliquis Not on any rate controlling meds as per med rec   Dysphagia: likely secondary to above. Continue on dysphagia I diet as per speech    Acute UTI: urine culture showed E. coli. Continue on keflex   BPH: continue on tamsulosin    HTN: continue on amlodipine   HLD: continue on statin    AKI: Cr is labile. Avoid nephrotoxic meds   Seizure disorder: continue on keppra    Dementia: continue w/ supportive care       DVT prophylaxis: eliquis  Code Status: DNR Family Communication:  Disposition Plan: d/c to SNF   Level of care: Telemetry Medical Status is: Inpatient Remains inpatient appropriate because: medically stable and needs SNF placement, CM is working on this    Consultants:  Neuro   Procedures:   Antimicrobials: keflex   Subjective: Pt c/o having to have a bowel movement   Objective: Vitals:   06/05/23 1628 06/05/23 1946 06/05/23 2342 06/06/23 0440  BP: (!) 131/59 124/65 (!) 140/69 (!) 152/73  Pulse: 85 78 66   Resp: 16 16 18 20   Temp: 98 F (36.7 C) 98.7 F (37.1 C) 98.1 F (36.7 C) 97.7 F (36.5 C)  TempSrc: Oral Oral  Oral  SpO2: 97% 96% 93% 95%  Weight:      Height:        Intake/Output Summary (Last 24 hours) at 06/06/2023 0754 Last data filed at 06/06/2023 0206 Gross per 24 hour  Intake --  Output 400 ml  Net -400 ml   Filed  Weights   05/31/23 1034  Weight: 74.8 kg    Examination:  General exam: Appears calm & comfortable  Respiratory system: clear breath sounds b/l  Cardiovascular system: S1/S2+. No rubs or gallops  Gastrointestinal system: Abd is soft, NT, ND & hypoactive bowel sounds  Central nervous system: Alert & awake. Moves all extremities  Psychiatry: judgement and insight appears at baseline.Flat mood and affect    Data Reviewed: I have personally reviewed following labs and imaging studies  CBC: Recent Labs  Lab 05/31/23 1131 06/01/23 0710 06/05/23 0343 06/06/23 0403  WBC 7.9 7.3 8.0 9.7  NEUTROABS 5.3 5.4  --   --   HGB 11.0* 10.2* 9.9* 10.4*  HCT 34.5* 31.9* 30.2* 31.7*  MCV 89.6 87.9 87.5 87.3  PLT 301 294 237 261   Basic Metabolic Panel: Recent Labs  Lab 05/31/23 1131 06/01/23 0710 06/05/23 0343 06/06/23 0403  NA 137 139 137 135  K 3.6 4.0 4.0 4.1  CL 109 109 107 106  CO2 21* 22 22 22   GLUCOSE 98 94 93 92  BUN 20 21 31* 32*  CREATININE 1.32* 1.17 1.13 1.15  CALCIUM 9.3 8.9 9.2 9.2   GFR: Estimated Creatinine Clearance: 45.5 mL/min (by C-G formula based on SCr of 1.15 mg/dL). Liver Function Tests: Recent Labs  Lab 05/31/23 1131  AST  19  ALT 12  ALKPHOS 68  BILITOT 0.5  PROT 7.1  ALBUMIN 3.6   No results for input(s): "LIPASE", "AMYLASE" in the last 168 hours. No results for input(s): "AMMONIA" in the last 168 hours. Coagulation Profile: No results for input(s): "INR", "PROTIME" in the last 168 hours. Cardiac Enzymes: No results for input(s): "CKTOTAL", "CKMB", "CKMBINDEX", "TROPONINI" in the last 168 hours. BNP (last 3 results) No results for input(s): "PROBNP" in the last 8760 hours. HbA1C: No results for input(s): "HGBA1C" in the last 72 hours. CBG: Recent Labs  Lab 05/31/23 1033  GLUCAP 92   Lipid Profile: No results for input(s): "CHOL", "HDL", "LDLCALC", "TRIG", "CHOLHDL", "LDLDIRECT" in the last 72 hours. Thyroid Function Tests: No  results for input(s): "TSH", "T4TOTAL", "FREET4", "T3FREE", "THYROIDAB" in the last 72 hours. Anemia Panel: No results for input(s): "VITAMINB12", "FOLATE", "FERRITIN", "TIBC", "IRON", "RETICCTPCT" in the last 72 hours. Sepsis Labs: No results for input(s): "PROCALCITON", "LATICACIDVEN" in the last 168 hours.  Recent Results (from the past 240 hour(s))  Urine Culture     Status: Abnormal   Collection Time: 05/31/23 11:58 AM   Specimen: Urine, Clean Catch  Result Value Ref Range Status   Specimen Description   Final    URINE, CLEAN CATCH Performed at [facility], [address], [place], [place] [address]    Special Requests   Final    NONE Performed at [facility], [address] [place], [place] [address]    Culture >=100,000 COLONIES/mL ESCHERICHIA COLI (A)  Final   Report Status 06/02/2023 FINAL  Final   Organism ID, Bacteria ESCHERICHIA COLI (A)  Final      Susceptibility   Escherichia coli - MIC*    AMPICILLIN 16 INTERMEDIATE Intermediate     CEFAZOLIN <=4 SENSITIVE Sensitive     CEFEPIME <=0.12 SENSITIVE Sensitive     CEFTRIAXONE <=0.25 SENSITIVE Sensitive     CIPROFLOXACIN >=4 RESISTANT Resistant     GENTAMICIN <=1 SENSITIVE Sensitive     IMIPENEM 1 SENSITIVE Sensitive     NITROFURANTOIN 32 SENSITIVE Sensitive     TRIMETH/SULFA >=320 RESISTANT Resistant     AMPICILLIN/SULBACTAM 8 SENSITIVE Sensitive     PIP/TAZO <=4 SENSITIVE Sensitive     * >=100,000 COLONIES/mL ESCHERICHIA COLI         Radiology Studies: No results found.      Scheduled Meds:  amLODipine  10 mg Oral Daily   apixaban  5 mg Oral BID   atorvastatin  20 mg Oral QPM   cephALEXin  500 mg Oral BID   levETIRAcetam  500 mg Oral BID   pantoprazole  40 mg Oral Daily   tamsulosin  0.4 mg Oral QPC breakfast   traZODone  100 mg Oral QHS   Continuous Infusions:   LOS: 5 days    Time spent: 25 mins     Charise Killian, MD [facility] Pager [phone]  [phone]  If 7PM-7AM, please contact night-coverage [email or web address] 06/06/2023, 7:54 AM

## 2023-06-07 DIAGNOSIS — I639 Cerebral infarction, unspecified: Secondary | ICD-10-CM | POA: Diagnosis not present

## 2023-06-07 LAB — BASIC METABOLIC PANEL
Anion gap: 7 (ref 5–15)
BUN: 40 mg/dL — ABNORMAL HIGH (ref 8–23)
CO2: 22 mmol/L (ref 22–32)
Calcium: 9.3 mg/dL (ref 8.9–10.3)
Chloride: 107 mmol/L (ref 98–111)
Creatinine, Ser: 1.23 mg/dL (ref 0.61–1.24)
GFR, Estimated: 58 mL/min — ABNORMAL LOW (ref >60)
Glucose, Bld: 97 mg/dL (ref 70–99)
Potassium: 4.6 mmol/L (ref 3.5–5.1)
Sodium: 136 mmol/L (ref 135–145)

## 2023-06-07 LAB — CBC
HCT: 29.9 % — ABNORMAL LOW (ref 39.0–52.0)
Hemoglobin: 9.8 g/dL — ABNORMAL LOW (ref 13.0–17.0)
MCH: 28.7 pg (ref 26.0–34.0)
MCHC: 32.8 g/dL (ref 30.0–36.0)
MCV: 87.4 fL (ref 80.0–100.0)
Platelets: 282 10*3/uL (ref 150–400)
RBC: 3.42 MIL/uL — ABNORMAL LOW (ref 4.22–5.81)
RDW: 20.1 % — ABNORMAL HIGH (ref 11.5–15.5)
WBC: 9.1 10*3/uL (ref 4.0–10.5)
nRBC: 0 % (ref 0.0–0.2)

## 2023-06-07 MED ORDER — PANTOPRAZOLE SODIUM 40 MG IV SOLR
40.0000 mg | Freq: Two times a day (BID) | INTRAVENOUS | Status: DC
  Administered 2023-06-07 – 2023-06-11 (×9): 40 mg via INTRAVENOUS
  Filled 2023-06-07 (×9): qty 10

## 2023-06-07 MED ORDER — PANTOPRAZOLE SODIUM 40 MG IV SOLR: 40.0000 mg | Freq: Two times a day (BID) | INTRAVENOUS | Status: DC

## 2023-06-07 MED ORDER — POLYETHYLENE GLYCOL 3350 17 G PO PACK
17.0000 g | PACK | Freq: Every day | ORAL | Status: DC
  Administered 2023-06-07 – 2023-06-11 (×4): 17 g via ORAL
  Filled 2023-06-07 (×4): qty 1

## 2023-06-07 NOTE — Progress Notes (Signed)
Pt was heard coughing during the night, and this writer entered into his room and observed moderate amount of brown streaked emesis. There was some mucous present, no undigested food noted. Patient assessed and cleaned up. Charge nurse and physician made aware. New order received for protonix 40mg  IV Q12 hours with the first dose to be given now. Medication administered as ordered with no additional concerns. Will continue to monitor

## 2023-06-07 NOTE — Progress Notes (Signed)
PROGRESS NOTE    Richard Carter  RUE:[id number] DOB: Oct 28, 1941 DOA: 05/31/2023 PCP: Marisue Ivan, MD   Assessment & Plan:   Principal Problem:   Acute CVA (cerebrovascular accident) [facility]) Active Problems:   UTI (urinary tract infection)   AKI (acute kidney injury) (HCC)   AF (paroxysmal atrial fibrillation) (HCC)   HTN (hypertension)   Seizures (HCC)   Dementia (HCC)  Assessment and Plan: Acute CVA: presented w/ slurred with right leg weakness. Continue on statin, eliquis. PT/OT recs SNF. Recent CVA s/p left carotid endarterectomy on 04/02/2023. Still needs SNF placement   Likely PAF: continue on eliquis. Not on any rate controlling meds as per med rec   Dysphagia: likely secondary to above.Continue on dysphagia I diet as per speech     Acute UTI: urine culture showed E. coli. Completed abx course   BPH: continue on tamsulosin     HTN: continue on amlodipine   HLD: continue on statin    AKI: Cr is labile. Avoid nephrotoxic meds   Seizure disorder: continue on keppra    Dementia: continue w/ supportive care       DVT prophylaxis: eliquis  Code Status: DNR Family Communication:  Disposition Plan: d/c to SNF   Level of care: Telemetry Medical Status is: Inpatient Remains inpatient appropriate because: medically stable and needs SNF placement, CM is working on this    Consultants:  Neuro   Procedures:   Antimicrobials:    Subjective: Pt c/o fatigue   Objective: Vitals:   06/06/23 2010 06/06/23 2341 06/07/23 0340 06/07/23 0731  BP: 118/63 121/64 126/67 132/66  Pulse: 71 71 64 62  Resp: 20 20 18 16   Temp: 97.8 F (36.6 C) 98.6 F (37 C) (!) 97.5 F (36.4 C) 97.9 F (36.6 C)  TempSrc:  Oral Oral Oral  SpO2: 95% 95% 93% 96%  Weight:      Height:        Intake/Output Summary (Last 24 hours) at 06/07/2023 0827 Last data filed at 06/07/2023 0544 Gross per 24 hour  Intake 120 ml  Output 400 ml  Net -280 ml   Filed Weights   05/31/23 1034   Weight: 74.8 kg    Examination:  General exam: Appears comfortable  Respiratory system: clear breath sounds b/l  Cardiovascular system: S1 & S2+. No rubs or gallops  Gastrointestinal system: abd is soft, NT, ND & hypoactive bowel sounds   Central nervous system: Alert & awake. Moves all extremities  Psychiatry: judgement and insight appears at baseline. Flat mood and affect     Data Reviewed: I have personally reviewed following labs and imaging studies  CBC: Recent Labs  Lab 05/31/23 1131 06/01/23 0710 06/05/23 0343 06/06/23 0403 06/07/23 0626  WBC 7.9 7.3 8.0 9.7 9.1  NEUTROABS 5.3 5.4  --   --   --   HGB 11.0* 10.2* 9.9* 10.4* 9.8*  HCT 34.5* 31.9* 30.2* 31.7* 29.9*  MCV 89.6 87.9 87.5 87.3 87.4  PLT 301 294 237 261 282   Basic Metabolic Panel: Recent Labs  Lab 05/31/23 1131 06/01/23 0710 06/05/23 0343 06/06/23 0403 06/07/23 0626  NA 137 139 137 135 136  K 3.6 4.0 4.0 4.1 4.6  CL 109 109 107 106 107  CO2 21* 22 22 22 22   GLUCOSE 98 94 93 92 97  BUN 20 21 31* 32* 40*  CREATININE 1.32* 1.17 1.13 1.15 1.23  CALCIUM 9.3 8.9 9.2 9.2 9.3   GFR: Estimated Creatinine Clearance: 42.5 mL/min (by  C-G formula based on SCr of 1.23 mg/dL). Liver Function Tests: Recent Labs  Lab 05/31/23 1131  AST 19  ALT 12  ALKPHOS 68  BILITOT 0.5  PROT 7.1  ALBUMIN 3.6   No results for input(s): "LIPASE", "AMYLASE" in the last 168 hours. No results for input(s): "AMMONIA" in the last 168 hours. Coagulation Profile: No results for input(s): "INR", "PROTIME" in the last 168 hours. Cardiac Enzymes: No results for input(s): "CKTOTAL", "CKMB", "CKMBINDEX", "TROPONINI" in the last 168 hours. BNP (last 3 results) No results for input(s): "PROBNP" in the last 8760 hours. HbA1C: No results for input(s): "HGBA1C" in the last 72 hours. CBG: Recent Labs  Lab 05/31/23 1033  GLUCAP 92   Lipid Profile: No results for input(s): "CHOL", "HDL", "LDLCALC", "TRIG", "CHOLHDL",  "LDLDIRECT" in the last 72 hours. Thyroid Function Tests: No results for input(s): "TSH", "T4TOTAL", "FREET4", "T3FREE", "THYROIDAB" in the last 72 hours. Anemia Panel: No results for input(s): "VITAMINB12", "FOLATE", "FERRITIN", "TIBC", "IRON", "RETICCTPCT" in the last 72 hours. Sepsis Labs: No results for input(s): "PROCALCITON", "LATICACIDVEN" in the last 168 hours.  Recent Results (from the past 240 hour(s))  Urine Culture     Status: Abnormal   Collection Time: 05/31/23 11:58 AM   Specimen: Urine, Clean Catch  Result Value Ref Range Status   Specimen Description   Final    URINE, CLEAN CATCH Performed at [facility], [address] [place], [place] [address]    Special Requests   Final    NONE Performed at [facility], [address] [place], [place] [address]    Culture >=100,000 COLONIES/mL ESCHERICHIA COLI (A)  Final   Report Status 06/02/2023 FINAL  Final   Organism ID, Bacteria ESCHERICHIA COLI (A)  Final      Susceptibility   Escherichia coli - MIC*    AMPICILLIN 16 INTERMEDIATE Intermediate     CEFAZOLIN <=4 SENSITIVE Sensitive     CEFEPIME <=0.12 SENSITIVE Sensitive     CEFTRIAXONE <=0.25 SENSITIVE Sensitive     CIPROFLOXACIN >=4 RESISTANT Resistant     GENTAMICIN <=1 SENSITIVE Sensitive     IMIPENEM 1 SENSITIVE Sensitive     NITROFURANTOIN 32 SENSITIVE Sensitive     TRIMETH/SULFA >=320 RESISTANT Resistant     AMPICILLIN/SULBACTAM 8 SENSITIVE Sensitive     PIP/TAZO <=4 SENSITIVE Sensitive     * >=100,000 COLONIES/mL ESCHERICHIA COLI         Radiology Studies: No results found.      Scheduled Meds:  amLODipine  10 mg Oral Daily   apixaban  5 mg Oral BID   atorvastatin  20 mg Oral QPM   levETIRAcetam  500 mg Oral BID   pantoprazole (PROTONIX) IV  40 mg Intravenous Q12H   tamsulosin  0.4 mg Oral QPC breakfast   traZODone  100 mg Oral QHS   Continuous Infusions:   LOS: 6 days    Time spent: 25 mins     Charise Killian, MD [facility] Pager [phone]  If 7PM-7AM, please contact night-coverage [email or web address] 06/07/2023, 8:27 AM

## 2023-06-07 NOTE — Progress Notes (Signed)
SLP Cancellation Note  Patient Details Name: Richard Carter MRN: [id number] DOB: Jan 22, 1941   Cancelled treatment:       Reason Eval/Treat Not Completed: Fatigue/lethargy limiting ability to participate. Despite verbal/tactile cues, pt not able to rouse long enough for meaningful participation in SLP services. Will continue efforts as appropriate.  Clyde Canterbury, M.S., CCC-SLP Speech-Language Pathologist [facility] [phone] Arnette Felts)  Woodroe Chen 06/07/2023, 9:50 AM

## 2023-06-08 DIAGNOSIS — I639 Cerebral infarction, unspecified: Secondary | ICD-10-CM | POA: Diagnosis not present

## 2023-06-08 LAB — CBC
HCT: 29.2 % — ABNORMAL LOW (ref 39.0–52.0)
Hemoglobin: 9.6 g/dL — ABNORMAL LOW (ref 13.0–17.0)
MCH: 28.7 pg (ref 26.0–34.0)
MCHC: 32.9 g/dL (ref 30.0–36.0)
MCV: 87.4 fL (ref 80.0–100.0)
Platelets: 211 10*3/uL (ref 150–400)
RBC: 3.34 MIL/uL — ABNORMAL LOW (ref 4.22–5.81)
RDW: 19.9 % — ABNORMAL HIGH (ref 11.5–15.5)
WBC: 6.6 10*3/uL (ref 4.0–10.5)
nRBC: 0 % (ref 0.0–0.2)

## 2023-06-08 LAB — BASIC METABOLIC PANEL
Anion gap: 7 (ref 5–15)
BUN: 43 mg/dL — ABNORMAL HIGH (ref 8–23)
CO2: 23 mmol/L (ref 22–32)
Calcium: 9.2 mg/dL (ref 8.9–10.3)
Chloride: 108 mmol/L (ref 98–111)
Creatinine, Ser: 1.33 mg/dL — ABNORMAL HIGH (ref 0.61–1.24)
GFR, Estimated: 53 mL/min — ABNORMAL LOW (ref >60)
Glucose, Bld: 93 mg/dL (ref 70–99)
Potassium: 4.8 mmol/L (ref 3.5–5.1)
Sodium: 138 mmol/L (ref 135–145)

## 2023-06-08 NOTE — Progress Notes (Signed)
PROGRESS NOTE    Richard Carter  ZOX:[id number] DOB: January 29, 1941 DOA: 05/31/2023 PCP: Marisue Ivan, MD   Assessment & Plan:   Principal Problem:   Acute CVA (cerebrovascular accident) [facility]) Active Problems:   UTI (urinary tract infection)   AKI (acute kidney injury) (HCC)   AF (paroxysmal atrial fibrillation) (HCC)   HTN (hypertension)   Seizures (HCC)   Dementia (HCC)  Assessment and Plan: Acute CVA: presented w/ slurred with right leg weakness. Continue on statin, eliquis. PT/OT recs SNF. Recent CVA s/p left carotid endarterectomy on 04/02/2023. Still needs SNF placement   Likely PAF: continue on eliquis. Not on any rate controlling meds as per med rec   Dysphagia: likely secondary to above. Continue on dysphagia I diet as per speech    Acute UTI: urine culture showed E. coli. Completed abx course   BPH: continue on tamsulosin      HTN: continue on amlodipine   HLD: continue on statin     AKI: Cr is trending up today. Avoid nephrotoxic meds   Seizure disorder: continue on keppra    Dementia: continue w/ supportive care       DVT prophylaxis: eliquis  Code Status: DNR Family Communication:  Disposition Plan: d/c to SNF   Level of care: Telemetry Medical Status is: Inpatient Remains inpatient appropriate because: still needs SNF placement, CM working on this. Medically stable    Consultants:  Neuro   Procedures:   Antimicrobials:    Subjective: Pt c/o malaise   Objective: Vitals:   06/07/23 1932 06/07/23 2011 06/08/23 0513 06/08/23 0744  BP: 130/68 117/65 121/67 130/61  Pulse: 75 61 (!) 56 (!) 54  Resp: 20 18 19 16   Temp: (!) 97.4 F (36.3 C) (!) 97.5 F (36.4 C) 98.2 F (36.8 C) 98.3 F (36.8 C)  TempSrc: Oral Oral Oral   SpO2: 96% 98% 96% 95%  Weight:      Height:        Intake/Output Summary (Last 24 hours) at 06/08/2023 0747 Last data filed at 06/07/2023 1947 Gross per 24 hour  Intake 420 ml  Output 300 ml  Net 120 ml    Filed Weights   05/31/23 1034  Weight: 74.8 kg    Examination:  General exam: Appears calm & comfortable  Respiratory system: clear breath sounds b/l  Cardiovascular system: S1/S2+. No rubs or clicks  Gastrointestinal system: abd is soft, NT, ND & hypoactive bowel sounds  Central nervous system: alert & awake. Moves all extremities  Psychiatry: judgement and insight appears normal. Flat mood and affect     Data Reviewed: I have personally reviewed following labs and imaging studies  CBC: Recent Labs  Lab 06/05/23 0343 06/06/23 0403 06/07/23 0626 06/08/23 0508  WBC 8.0 9.7 9.1 6.6  HGB 9.9* 10.4* 9.8* 9.6*  HCT 30.2* 31.7* 29.9* 29.2*  MCV 87.5 87.3 87.4 87.4  PLT 237 261 282 211   Basic Metabolic Panel: Recent Labs  Lab 06/05/23 0343 06/06/23 0403 06/07/23 0626 06/08/23 0508  NA 137 135 136 138  K 4.0 4.1 4.6 4.8  CL 107 106 107 108  CO2 22 22 22 23   GLUCOSE 93 92 97 93  BUN 31* 32* 40* 43*  CREATININE 1.13 1.15 1.23 1.33*  CALCIUM 9.2 9.2 9.3 9.2   GFR: Estimated Creatinine Clearance: 39.3 mL/min (A) (by C-G formula based on SCr of 1.33 mg/dL (H)). Liver Function Tests: No results for input(s): "AST", "ALT", "ALKPHOS", "BILITOT", "PROT", "ALBUMIN" in the last  168 hours.  No results for input(s): "LIPASE", "AMYLASE" in the last 168 hours. No results for input(s): "AMMONIA" in the last 168 hours. Coagulation Profile: No results for input(s): "INR", "PROTIME" in the last 168 hours. Cardiac Enzymes: No results for input(s): "CKTOTAL", "CKMB", "CKMBINDEX", "TROPONINI" in the last 168 hours. BNP (last 3 results) No results for input(s): "PROBNP" in the last 8760 hours. HbA1C: No results for input(s): "HGBA1C" in the last 72 hours. CBG: No results for input(s): "GLUCAP" in the last 168 hours.  Lipid Profile: No results for input(s): "CHOL", "HDL", "LDLCALC", "TRIG", "CHOLHDL", "LDLDIRECT" in the last 72 hours. Thyroid Function Tests: No results for  input(s): "TSH", "T4TOTAL", "FREET4", "T3FREE", "THYROIDAB" in the last 72 hours. Anemia Panel: No results for input(s): "VITAMINB12", "FOLATE", "FERRITIN", "TIBC", "IRON", "RETICCTPCT" in the last 72 hours. Sepsis Labs: No results for input(s): "PROCALCITON", "LATICACIDVEN" in the last 168 hours.  Recent Results (from the past 240 hour(s))  Urine Culture     Status: Abnormal   Collection Time: 05/31/23 11:58 AM   Specimen: Urine, Clean Catch  Result Value Ref Range Status   Specimen Description   Final    URINE, CLEAN CATCH Performed at [facility], [address] [place], [place] [address]    Special Requests   Final    NONE Performed at [facility], [address] [place], [place] [address]    Culture >=100,000 COLONIES/mL ESCHERICHIA COLI (A)  Final   Report Status 06/02/2023 FINAL  Final   Organism ID, Bacteria ESCHERICHIA COLI (A)  Final      Susceptibility   Escherichia coli - MIC*    AMPICILLIN 16 INTERMEDIATE Intermediate     CEFAZOLIN <=4 SENSITIVE Sensitive     CEFEPIME <=0.12 SENSITIVE Sensitive     CEFTRIAXONE <=0.25 SENSITIVE Sensitive     CIPROFLOXACIN >=4 RESISTANT Resistant     GENTAMICIN <=1 SENSITIVE Sensitive     IMIPENEM 1 SENSITIVE Sensitive     NITROFURANTOIN 32 SENSITIVE Sensitive     TRIMETH/SULFA >=320 RESISTANT Resistant     AMPICILLIN/SULBACTAM 8 SENSITIVE Sensitive     PIP/TAZO <=4 SENSITIVE Sensitive     * >=100,000 COLONIES/mL ESCHERICHIA COLI         Radiology Studies: No results found.      Scheduled Meds:  amLODipine  10 mg Oral Daily   apixaban  5 mg Oral BID   atorvastatin  20 mg Oral QPM   levETIRAcetam  500 mg Oral BID   pantoprazole (PROTONIX) IV  40 mg Intravenous Q12H   polyethylene glycol  17 g Oral Daily   tamsulosin  0.4 mg Oral QPC breakfast   traZODone  100 mg Oral QHS   Continuous Infusions:   LOS: 7 days    Time spent: 25 mins     Charise Killian, MD [facility] Pager [phone]  If 7PM-7AM, please contact night-coverage [email or web address] 06/08/2023, 7:47 AM

## 2023-06-08 NOTE — Progress Notes (Signed)
Speech Language Pathology Treatment: Dysphagia  Patient Details Name: Richard Carter MRN: [id number] DOB: 1940/12/03 Today's Date: 06/08/2023 Time: 3016-0109 SLP Time Calculation (min) (ACUTE ONLY): 15 min  Assessment / Plan / Recommendation Clinical Impression  Pt seen for ongoing dysphagia management.   Of note, pt has refused participation in ST services, see below for details:  06/04/2023 - requested services to attempt next day 06/05/2023 - declined d/t "feeling tired" 06/07/2023 - lethargy  This Clinical research associate overhead pt requesting ice cream from floor receptionist. Entered room to find pt's son helping pt with dysphagia 1 breakfast and nectar thick liquids. Education provided to pt and his son as well as Education administrator that pt was currently prescribed nectar thick liquids and foods/liquids such as ice cream, jello, milkshakes were not considered to be nectar thick. Written information also updated in pt's room. Skilled observation of pt consuming puree with nectar thick liquids was provided. Pt consumed puree with baseline extended oral prep but complete oral clearing and pt was free of immediate coughing when consuming nectar thick liquids. After consumption, several minutes after leaving pt's room, pt could be heard coughing but this was not in direct relationship to PO consumption. At this time, pt appears at chronic baseline is appropriate for discharge from skilled services.   Pt, his son, attending (Dr Fabienne Bruns) and pt's nurse are aware.    HPI HPI: Pt admitted to [facility] on 05/31/23 under observation for c/o stroke like symptoms including: facial droop, dysarthria, and RLE weakness. Pt outside the window for tPA. Imaging significant for: punctate acute to early subacute infarct in the L internal capsule/basal ganglia, and probable late subacute to early chronic infarct in the L corona radiata. Significant PMH includes: hx CVA, carotid endarterectomy (03/2023), Afib on Eliquis, HTN, hLD,  mixed vascular and Alzheimer's dementia, seizure disorder, alcohol use disorder (in remission).      SLP Plan  All goals met      Recommendations for follow up therapy are one component of a multi-disciplinary discharge planning process, led by the attending physician.  Recommendations may be updated based on patient status, additional functional criteria and insurance authorization.    Recommendations  Diet recommendations: Dysphagia 1 (puree);Nectar-thick liquid Liquids provided via: Teaspoon;Cup Medication Administration: Crushed with puree Supervision: Full supervision/cueing for compensatory strategies;Trained caregiver to feed patient Compensations: Minimize environmental distractions;Slow rate;Small sips/bites Postural Changes and/or Swallow Maneuvers: Upright 30-60 min after meal;Seated upright 90 degrees                  Oral care QID;Staff/trained caregiver to provide oral care   Frequent or constant Supervision/Assistance Dysphagia, oropharyngeal phase (R13.12)     All goals met    Elynore Dolinski B. Dreama Saa, M.S., CCC-SLP, Tree surgeon Certified Brain Injury Specialist [facility]  [facility] Office [phone] Ascom [phone] Fax [phone]

## 2023-06-08 NOTE — Plan of Care (Signed)
  Problem: Education: Goal: Knowledge of disease or condition will improve Outcome: Progressing Goal: Knowledge of secondary prevention will improve (MUST DOCUMENT ALL) Outcome: Progressing Goal: Knowledge of patient specific risk factors will improve (Mark N/A or DELETE if not current risk factor) Outcome: Progressing   Problem: Ischemic Stroke/TIA Tissue Perfusion: Goal: Complications of ischemic stroke/TIA will be minimized Outcome: Progressing   Problem: Coping: Goal: Will verbalize positive feelings about self Outcome: Progressing Goal: Will identify appropriate support needs Outcome: Progressing   Problem: Health Behavior/Discharge Planning: Goal: Ability to manage health-related needs will improve Outcome: Progressing Goal: Goals will be collaboratively established with patient/family Outcome: Progressing   Problem: Self-Care: Goal: Ability to participate in self-care as condition permits will improve Outcome: Progressing Goal: Verbalization of feelings and concerns over difficulty with self-care will improve Outcome: Progressing Goal: Ability to communicate needs accurately will improve Outcome: Progressing   Problem: Nutrition: Goal: Risk of aspiration will decrease Outcome: Progressing Goal: Dietary intake will improve Outcome: Progressing   Problem: Education: Goal: Knowledge of General Education information will improve Description: Including pain rating scale, medication(s)/side effects and non-pharmacologic comfort measures Outcome: Progressing   Problem: Health Behavior/Discharge Planning: Goal: Ability to manage health-related needs will improve Outcome: Progressing   Problem: Clinical Measurements: Goal: Ability to maintain clinical measurements within normal limits will improve Outcome: Progressing Goal: Will remain free from infection Outcome: Progressing Goal: Diagnostic test results will improve Outcome: Progressing Goal: Respiratory  complications will improve Outcome: Progressing Goal: Cardiovascular complication will be avoided Outcome: Progressing   Problem: Activity: Goal: Risk for activity intolerance will decrease Outcome: Progressing   Problem: Nutrition: Goal: Adequate nutrition will be maintained Outcome: Progressing   Problem: Coping: Goal: Level of anxiety will decrease Outcome: Progressing   Problem: Elimination: Goal: Will not experience complications related to bowel motility Outcome: Progressing Goal: Will not experience complications related to urinary retention Outcome: Progressing   Problem: Pain Managment: Goal: General experience of comfort will improve Outcome: Progressing   Problem: Safety: Goal: Ability to remain free from injury will improve Outcome: Progressing   Problem: Skin Integrity: Goal: Risk for impaired skin integrity will decrease Outcome: Progressing   Problem: Education: Goal: Knowledge of disease or condition will improve Outcome: Progressing Goal: Knowledge of secondary prevention will improve (MUST DOCUMENT ALL) Outcome: Progressing Goal: Knowledge of patient specific risk factors will improve (Mark N/A or DELETE if not current risk factor) Outcome: Progressing   Problem: Ischemic Stroke/TIA Tissue Perfusion: Goal: Complications of ischemic stroke/TIA will be minimized Outcome: Progressing   Problem: Coping: Goal: Will verbalize positive feelings about self Outcome: Progressing Goal: Will identify appropriate support needs Outcome: Progressing   Problem: Health Behavior/Discharge Planning: Goal: Ability to manage health-related needs will improve Outcome: Progressing Goal: Goals will be collaboratively established with patient/family Outcome: Progressing   Problem: Self-Care: Goal: Ability to participate in self-care as condition permits will improve Outcome: Progressing Goal: Verbalization of feelings and concerns over difficulty with self-care  will improve Outcome: Progressing Goal: Ability to communicate needs accurately will improve Outcome: Progressing   Problem: Nutrition: Goal: Risk of aspiration will decrease Outcome: Progressing Goal: Dietary intake will improve Outcome: Progressing   

## 2023-06-08 NOTE — Plan of Care (Signed)
  Problem: Education: Goal: Knowledge of disease or condition will improve 06/08/2023 0016 by Smiley Houseman, LPN Outcome: Progressing 06/08/2023 0015 by Smiley Houseman, LPN Outcome: Progressing Goal: Knowledge of secondary prevention will improve (MUST DOCUMENT ALL) 06/08/2023 0016 by Smiley Houseman, LPN Outcome: Progressing 06/08/2023 0015 by Smiley Houseman, LPN Outcome: Progressing Goal: Knowledge of patient specific risk factors will improve Loraine Leriche N/A or DELETE if not current risk factor) 06/08/2023 0016 by Smiley Houseman, LPN Outcome: Progressing 06/08/2023 0015 by Smiley Houseman, LPN Outcome: Progressing

## 2023-06-09 DIAGNOSIS — I639 Cerebral infarction, unspecified: Secondary | ICD-10-CM | POA: Diagnosis not present

## 2023-06-09 LAB — CBC
HCT: 29.1 % — ABNORMAL LOW (ref 39.0–52.0)
Hemoglobin: 9.5 g/dL — ABNORMAL LOW (ref 13.0–17.0)
MCH: 28.4 pg (ref 26.0–34.0)
MCHC: 32.6 g/dL (ref 30.0–36.0)
MCV: 87.1 fL (ref 80.0–100.0)
Platelets: 309 10*3/uL (ref 150–400)
RBC: 3.34 MIL/uL — ABNORMAL LOW (ref 4.22–5.81)
RDW: 19.8 % — ABNORMAL HIGH (ref 11.5–15.5)
WBC: 7.4 10*3/uL (ref 4.0–10.5)
nRBC: 0 % (ref 0.0–0.2)

## 2023-06-09 LAB — BASIC METABOLIC PANEL
Anion gap: 7 (ref 5–15)
BUN: 43 mg/dL — ABNORMAL HIGH (ref 8–23)
CO2: 23 mmol/L (ref 22–32)
Calcium: 9.4 mg/dL (ref 8.9–10.3)
Chloride: 106 mmol/L (ref 98–111)
Creatinine, Ser: 1.31 mg/dL — ABNORMAL HIGH (ref 0.61–1.24)
GFR, Estimated: 54 mL/min — ABNORMAL LOW (ref >60)
Glucose, Bld: 89 mg/dL (ref 70–99)
Potassium: 4.6 mmol/L (ref 3.5–5.1)
Sodium: 136 mmol/L (ref 135–145)

## 2023-06-09 MED ORDER — ORAL CARE MOUTH RINSE: 15.0000 mL | OROMUCOSAL | Status: DC | PRN

## 2023-06-09 MED ORDER — ORAL CARE MOUTH RINSE
15.0000 mL | OROMUCOSAL | Status: DC
  Administered 2023-06-09 – 2023-06-11 (×10): 15 mL via OROMUCOSAL

## 2023-06-09 NOTE — Progress Notes (Signed)
PROGRESS NOTE    CLEARANCE CHENAULT  GGY:[id number] DOB: 1941/05/07 DOA: 05/31/2023 PCP: Marisue Ivan, MD   Assessment & Plan:   Principal Problem:   Acute CVA (cerebrovascular accident) [facility]) Active Problems:   UTI (urinary tract infection)   AKI (acute kidney injury) (HCC)   AF (paroxysmal atrial fibrillation) (HCC)   HTN (hypertension)   Seizures (HCC)   Dementia (HCC)  Assessment and Plan: Acute CVA: presented w/ slurred with right leg weakness. Continue on statin, eliquis. PT/OT recs SNF. Recent CVA s/p left carotid endarterectomy on 04/02/2023.  Has SNF placement but needs insurance auth as per CM   Likely PAF: continue on eliquis. Not on any rate controlling meds as per med rec   Dysphagia: likely secondary to above. Continue on dysphagia I diet as per speech    Acute UTI: urine culture showed e. coli. Completed abx course   BPH: continue on tamsulosin     HTN: continue on amlodipine   HLD: continue on statin      AKI: Cr is labile. Avoid nephrotoxic meds   Seizure disorder: continue on keppra    Dementia: continue w/ supportive care      DVT prophylaxis: eliquis  Code Status: DNR Family Communication:  Disposition Plan: d/c to SNF   Level of care: Telemetry Medical Status is: Inpatient Remains inpatient appropriate because: medically stable to d/c to SNF. Needs insurance auth and CM working on this    Consultants:  Neuro   Procedures:   Antimicrobials:    Subjective: Pt c/o fatigue   Objective: Vitals:   06/08/23 0744 06/08/23 1555 06/08/23 2051 06/09/23 0423  BP: 130/61 (!) 117/59 126/72 (!) 140/77  Pulse: (!) 54 (!) 59 89 (!) 56  Resp: 16 18 20 17   Temp: 98.3 F (36.8 C) (!) 97.4 F (36.3 C) 97.9 F (36.6 C) 97.8 F (36.6 C)  TempSrc:    Oral  SpO2: 95% 98% 97% 97%  Weight:      Height:        Intake/Output Summary (Last 24 hours) at 06/09/2023 0757 Last data filed at 06/09/2023 0426 Gross per 24 hour  Intake 360 ml  Output  950 ml  Net -590 ml   Filed Weights   05/31/23 1034  Weight: 74.8 kg    Examination:  General exam: Appears comfortable   Respiratory system: clear breath sounds b/l  Cardiovascular system: S1 & S2+. No rubs or clicks  Gastrointestinal system: abd is soft, NT, ND & hypoactive bowel sounds  Central nervous system: alert & awake. Moves all extremities  Psychiatry: judgement and insight appears improved. Flat mood and affect     Data Reviewed: I have personally reviewed following labs and imaging studies  CBC: Recent Labs  Lab 06/05/23 0343 06/06/23 0403 06/07/23 0626 06/08/23 0508 06/09/23 0431  WBC 8.0 9.7 9.1 6.6 7.4  HGB 9.9* 10.4* 9.8* 9.6* 9.5*  HCT 30.2* 31.7* 29.9* 29.2* 29.1*  MCV 87.5 87.3 87.4 87.4 87.1  PLT 237 261 282 211 309   Basic Metabolic Panel: Recent Labs  Lab 06/05/23 0343 06/06/23 0403 06/07/23 0626 06/08/23 0508 06/09/23 0431  NA 137 135 136 138 136  K 4.0 4.1 4.6 4.8 4.6  CL 107 106 107 108 106  CO2 22 22 22 23 23   GLUCOSE 93 92 97 93 89  BUN 31* 32* 40* 43* 43*  CREATININE 1.13 1.15 1.23 1.33* 1.31*  CALCIUM 9.2 9.2 9.3 9.2 9.4   GFR: Estimated Creatinine Clearance: 39.9  mL/min (A) (by C-G formula based on SCr of 1.31 mg/dL (H)). Liver Function Tests: No results for input(s): "AST", "ALT", "ALKPHOS", "BILITOT", "PROT", "ALBUMIN" in the last 168 hours.  No results for input(s): "LIPASE", "AMYLASE" in the last 168 hours. No results for input(s): "AMMONIA" in the last 168 hours. Coagulation Profile: No results for input(s): "INR", "PROTIME" in the last 168 hours. Cardiac Enzymes: No results for input(s): "CKTOTAL", "CKMB", "CKMBINDEX", "TROPONINI" in the last 168 hours. BNP (last 3 results) No results for input(s): "PROBNP" in the last 8760 hours. HbA1C: No results for input(s): "HGBA1C" in the last 72 hours. CBG: No results for input(s): "GLUCAP" in the last 168 hours.  Lipid Profile: No results for input(s): "CHOL", "HDL",  "LDLCALC", "TRIG", "CHOLHDL", "LDLDIRECT" in the last 72 hours. Thyroid Function Tests: No results for input(s): "TSH", "T4TOTAL", "FREET4", "T3FREE", "THYROIDAB" in the last 72 hours. Anemia Panel: No results for input(s): "VITAMINB12", "FOLATE", "FERRITIN", "TIBC", "IRON", "RETICCTPCT" in the last 72 hours. Sepsis Labs: No results for input(s): "PROCALCITON", "LATICACIDVEN" in the last 168 hours.  Recent Results (from the past 240 hour(s))  Urine Culture     Status: Abnormal   Collection Time: 05/31/23 11:58 AM   Specimen: Urine, Clean Catch  Result Value Ref Range Status   Specimen Description   Final    URINE, CLEAN CATCH Performed at [facility], [address] [place], [place] [address]    Special Requests   Final    NONE Performed at [facility], [address] [place], [place] [address]    Culture >=100,000 COLONIES/mL ESCHERICHIA COLI (A)  Final   Report Status 06/02/2023 FINAL  Final   Organism ID, Bacteria ESCHERICHIA COLI (A)  Final      Susceptibility   Escherichia coli - MIC*    AMPICILLIN 16 INTERMEDIATE Intermediate     CEFAZOLIN <=4 SENSITIVE Sensitive     CEFEPIME <=0.12 SENSITIVE Sensitive     CEFTRIAXONE <=0.25 SENSITIVE Sensitive     CIPROFLOXACIN >=4 RESISTANT Resistant     GENTAMICIN <=1 SENSITIVE Sensitive     IMIPENEM 1 SENSITIVE Sensitive     NITROFURANTOIN 32 SENSITIVE Sensitive     TRIMETH/SULFA >=320 RESISTANT Resistant     AMPICILLIN/SULBACTAM 8 SENSITIVE Sensitive     PIP/TAZO <=4 SENSITIVE Sensitive     * >=100,000 COLONIES/mL ESCHERICHIA COLI         Radiology Studies: No results found.      Scheduled Meds:  amLODipine  10 mg Oral Daily   apixaban  5 mg Oral BID   atorvastatin  20 mg Oral QPM   levETIRAcetam  500 mg Oral BID   mouth rinse  15 mL Mouth Rinse 4 times per day   pantoprazole (PROTONIX) IV  40 mg Intravenous Q12H   polyethylene glycol  17 g Oral Daily   tamsulosin  0.4 mg Oral QPC breakfast    traZODone  100 mg Oral QHS   Continuous Infusions:   LOS: 8 days    Time spent: 25 mins     Charise Killian, MD [facility] Pager [phone]  If 7PM-7AM, please contact night-coverage [email or web address] 06/09/2023, 7:57 AM

## 2023-06-09 NOTE — Progress Notes (Signed)
Physical Therapy Treatment Patient Details Name: Richard Carter MRN: [id number] DOB: 1940/11/26 Today's Date: 06/09/2023   History of Present Illness Pt admitted to [facility] on 05/31/23 under observation for c/o stroke like symptoms including: facial droop, dysarthria, and RLE weakness. Pt outside the window for tPA. Imaging significant for: punctate acute to early subacute infarct in the L internal capsule/basal ganglia, and probable late subacute to early chronic infarct in the L corona radiata. Significant PMH includes: hx CVA, carotid endarterectomy (03/2023), Afib on Eliquis, HTN, hLD, mixed vascular and Alzheimer's dementia, seizure disorder, alcohol use disorder (in remission).    PT Comments  Pt resting in bed upon PT arrival; agreeable to therapy.  Pt's neighbor present beginning of session and encouraging pt.  During session pt min assist semi-supine to sitting edge of bed; min assist with transfers using RW; and min assist to ambulate 12 feet and then (after sitting rest break) 20 feet with RW use.  Vc's required for staying closer to RW and for longer R LE step length during ambulation.  Will continue to focus on strengthening, balance, and progressive functional mobility during hospitalization.     Assistance Recommended at Discharge Frequent or constant Supervision/Assistance  If plan is discharge home, recommend the following:  Can travel by private vehicle    Assistance with cooking/housework;Direct supervision/assist for medications management;Direct supervision/assist for financial management;Assist for transportation;Help with stairs or ramp for entrance;A lot of help with bathing/dressing/bathroom;A little help with walking and/or transfers   Yes  Equipment Recommendations  Other (comment) (TBD at next facility)    Recommendations for Other Services OT consult     Precautions / Restrictions Precautions Precautions: Fall Precaution Comments: Aspiration;  DNR Restrictions Weight Bearing Restrictions: No     Mobility  Bed Mobility Overal bed mobility: Needs Assistance Bed Mobility: Supine to Sit     Supine to sit: Min assist, HOB elevated     General bed mobility comments: assist for trunk; increased effort/time to perform; vc's for technique    Transfers Overall transfer level: Needs assistance Equipment used: Rolling walker (2 wheels) Transfers: Sit to/from Stand Sit to Stand: Min assist           General transfer comment: x3 trials from bed and x1 trial from recliner (sit to stand); vc's for UE placement; assist to control descent sitting    Ambulation/Gait Ambulation/Gait assistance: Min assist Gait Distance (Feet):  (12 feet; 20 feet) Assistive device: Rolling walker (2 wheels) Gait Pattern/deviations: Trunk flexed, Shuffle, Decreased step length - right, Decreased step length - left, Decreased stride length, Step-through pattern Gait velocity: decreased     General Gait Details: vc's to stay closer to RW and for longer R LE step length (R LE step length shorter than L LE step length); assist to steady   Stairs             Wheelchair Mobility     Tilt Bed    Modified Rankin (Stroke Patients Only)       Balance Overall balance assessment: Needs assistance Sitting-balance support: No upper extremity supported, Feet supported Sitting balance-Leahy Scale: Good Sitting balance - Comments: steady reaching within BOS   Standing balance support: Bilateral upper extremity supported, During functional activity, Reliant on assistive device for balance Standing balance-Leahy Scale: Fair Standing balance comment: steady static standing with B UE support on RW  Cognition Arousal/Alertness: Awake/alert Behavior During Therapy: Flat affect Overall Cognitive Status: No family/caregiver present to determine baseline cognitive functioning                                  General Comments: Increased time to follow 1 step cues        Exercises      General Comments  Nursing cleared pt for participation in physical therapy.  Pt agreeable to PT session.      Pertinent Vitals/Pain Pain Assessment Pain Assessment: No/denies pain Vitals (HR and SpO2 on room air) stable and WFL throughout treatment session.    Home Living                          Prior Function            PT Goals (current goals can now be found in the care plan section) Acute Rehab PT Goals Patient Stated Goal: to improve mobility PT Goal Formulation: With patient Time For Goal Achievement: 06/15/23 Potential to Achieve Goals: Fair Progress towards PT goals: Progressing toward goals    Frequency    Min 1X/week      PT Plan Current plan remains appropriate    Co-evaluation              AM-PAC PT "6 Clicks" Mobility   Outcome Measure  Help needed turning from your back to your side while in a flat bed without using bedrails?: A Little Help needed moving from lying on your back to sitting on the side of a flat bed without using bedrails?: A Little Help needed moving to and from a bed to a chair (including a wheelchair)?: A Little Help needed standing up from a chair using your arms (e.g., wheelchair or bedside chair)?: A Little Help needed to walk in hospital room?: A Little Help needed climbing 3-5 steps with a railing? : A Lot 6 Click Score: 17    End of Session Equipment Utilized During Treatment: Gait belt Activity Tolerance: Patient tolerated treatment well Patient left: in chair;with call bell/phone within reach;with chair alarm set Nurse Communication: Mobility status;Precautions PT Visit Diagnosis: Unsteadiness on feet (R26.81);Other abnormalities of gait and mobility (R26.89);Muscle weakness (generalized) (M62.81);History of falling (Z91.81);Difficulty in walking, not elsewhere classified (R26.2);Hemiplegia and  hemiparesis Hemiplegia - Right/Left: Right Hemiplegia - caused by: Cerebral infarction     Time: 2952-8413 PT Time Calculation (min) (ACUTE ONLY): 18 min  Charges:    $Gait Training: 8-22 mins PT General Charges $$ ACUTE PT VISIT: 1 Visit                     Hendricks Limes, PT 06/09/23, 11:56 AM

## 2023-06-09 NOTE — Care Management Important Message (Signed)
Important Message  Patient Details  Name: Richard Carter MRN: [id number] Date of Birth: 1941/10/15   Medicare Important Message Given:  N/A - LOS <3 / Initial given by admissions  Marked wrong selection. IM was given. Will enter a corrected note.    Olegario Messier A Charles Andringa 06/09/2023, 2:44 PM

## 2023-06-09 NOTE — Plan of Care (Signed)
  Problem: Education: Goal: Knowledge of disease or condition will improve Outcome: Not Progressing Goal: Knowledge of secondary prevention will improve (MUST DOCUMENT ALL) Outcome: Not Progressing   Problem: Ischemic Stroke/TIA Tissue Perfusion: Goal: Complications of ischemic stroke/TIA will be minimized Outcome: Progressing   Problem: Coping: Goal: Will verbalize positive feelings about self Outcome: Progressing

## 2023-06-10 DIAGNOSIS — I639 Cerebral infarction, unspecified: Secondary | ICD-10-CM | POA: Diagnosis not present

## 2023-06-10 NOTE — Progress Notes (Signed)
Occupational Therapy Treatment Patient Details Name: Richard Carter MRN: [id number] DOB: [birth year] Today's Date: 06/10/2023   History of present illness Pt admitted to [facility] on 05/31/23 under observation for c/o stroke like symptoms including: facial droop, dysarthria, and RLE weakness. Pt outside the window for tPA. Imaging significant for: punctate acute to early subacute infarct in the L internal capsule/basal ganglia, and probable late subacute to early chronic infarct in the L corona radiata. Significant PMH includes: hx CVA, carotid endarterectomy (03/2023), Afib on Eliquis, HTN, hLD, mixed vascular and Alzheimer's dementia, seizure disorder, alcohol use disorder (in remission).   OT comments  Richard Carter was seen for OT treatment on this date. Upon arrival to room pt supine in bed, agreeable to OT Tx session. OT facilitated ADL management as described below. Pt educated on compensatory strategies for improved safety and functional independence with self feeding this date. See ADL section for additional details regarding occupational performance. Pt continues to be functionally limited by R sided weakness, decreased activity tolerance, and decreased cognition. Pt return verbalizes understanding of education provided t/o session. Pt is progressing toward OT goals and continues to benefit from skilled OT services to maximize return to PLOF and minimize risk of future falls, injury, caregiver burden, and readmission. Will continue to follow POC as written. Discharge recommendation remains appropriate.     Recommendations for follow up therapy are one component of a multi-disciplinary discharge planning process, led by the attending physician.  Recommendations may be updated based on patient status, additional functional criteria and insurance authorization.    Assistance Recommended at Discharge Frequent or constant Supervision/Assistance  Patient can return home with the following  A lot of help  with walking and/or transfers;A lot of help with bathing/dressing/bathroom;Assistance with cooking/housework;Direct supervision/assist for medications management;Direct supervision/assist for financial management;Assist for transportation;Help with stairs or ramp for entrance   Equipment Recommendations       Recommendations for Other Services      Precautions / Restrictions Precautions Precautions: Fall Precaution Comments: Aspiration; DNR Restrictions Weight Bearing Restrictions: No       Mobility Bed Mobility Overal bed mobility: Needs Assistance             General bed mobility comments: pt declines OOB/EOB activity 2/2 fatigue and recent transition back to bed from chair. Requires MAX A for repositioning to bring bed into chair position. Is able to independently adjust/shift weight while upright in chair position using bed rails, but fatigues quickly.    Transfers                         Balance Overall balance assessment: Needs assistance                                         ADL either performed or assessed with clinical judgement   ADL Overall ADL's : Needs assistance/impaired Eating/Feeding: Sitting;Minimal assistance;Cueing for sequencing;Cueing for safety Eating/Feeding Details (indicate cue type and reason): Treatement session focused on improved independence and participation in self-feeding/drinking. Pt declines OOB to chair but agreeable to bed set up in chair position. He is able to bring food/drink to mouth using a spoon/cup in his LUE., Of note, pt on Dys 1 diet with nectar thick liquids. Meal tray items set up by OT. Pt noted with difficulty loading spoon and requires assistance with bites from his plate. He is able  to eat ~90% of meal with assist loading each bite. He is able to hold magic cup in RUE and use spoon in LUE to take bites at end of meal. He fatigues quickly with BUE activity and requests frequent rest breaks and  assistance from OT for bites. Grooming: Wash/dry face;Sitting;Minimal assistance                                      Extremity/Trunk Assessment              Vision Patient Visual Report: No change from baseline     Perception     Praxis      Cognition Arousal/Alertness: Awake/alert Behavior During Therapy: Flat affect Overall Cognitive Status: No family/caregiver present to determine baseline cognitive functioning                                 General Comments: Increased time to follow 1 step cues        Exercises Other Exercises Other Exercises: OT facilitated ADL management with educaiton and assist as described above.    Shoulder Instructions       General Comments      Pertinent Vitals/ Pain       Pain Assessment Pain Assessment: No/denies pain  Home Living                                          Prior Functioning/Environment              Frequency  Min 1X/week        Progress Toward Goals  OT Goals(current goals can now be found in the care plan section)  Progress towards OT goals: Progressing toward goals  Acute Rehab OT Goals Patient Stated Goal: to go home OT Goal Formulation: With patient Time For Goal Achievement: 06/15/23 Potential to Achieve Goals: Fair  Plan Discharge plan remains appropriate;Frequency remains appropriate    Co-evaluation                 AM-PAC OT "6 Clicks" Daily Activity     Outcome Measure   Help from another person eating meals?: A Little Help from another person taking care of personal grooming?: A Little Help from another person toileting, which includes using toliet, bedpan, or urinal?: A Lot Help from another person bathing (including washing, rinsing, drying)?: A Lot Help from another person to put on and taking off regular upper body clothing?: A Lot Help from another person to put on and taking off regular lower body clothing?: A Lot 6  Click Score: 14    End of Session    OT Visit Diagnosis: Unsteadiness on feet (R26.81)   Activity Tolerance Patient limited by fatigue   Patient Left in bed;with call bell/phone within reach;with bed alarm set   Nurse Communication Mobility status        Time: 1610-9604 OT Time Calculation (min): 33 min  Charges: OT General Charges $OT Visit: 1 Visit OT Treatments $Self Care/Home Management : 23-37 mins  Rockney Ghee, M.S., OTR/L 06/10/23, 3:02 PM

## 2023-06-10 NOTE — TOC Progression Note (Signed)
Transition of Care [facility]) - Progression Note    Patient Details  Name: Richard Carter MRN: [id number] Date of Birth: 04-26-1941  Transition of Care [facility]) CM/SW Contact  Garret Reddish, RN Phone Number: 06/10/2023, 11:23 AM  Clinical Narrative:   Chart reviewed.  I have received [facility] authorization.  Plan Auth ID- [id number], Plan auth ID- [id number].  Aprroved from 06-10-2023- 06-12-23.  Next review date is 06-12-2023  I have LVM for Gavin Pound with  [facility] to informed her of SNF authorization approval and to see if she is able to accept patient today at facility.  I have made Dr. Mayford Knife aware.      Expected Discharge Plan: Home w Home Health Services Barriers to Discharge: No Barriers Identified  Expected Discharge Plan and Services   Discharge Planning Services: CM Consult Post Acute Care Choice: Home Health Living arrangements for the past 2 months: Single Family Home                   DME Agency:  (Patient has a BSC, wheelchair and 2 wheeled rollling walker at home.)       HH Arranged:  (Patient is active with [facility])           Social Determinants of Health (SDOH) Interventions SDOH Screenings   Food Insecurity: Patient Declined (05/31/2023)  Housing: Patient Declined (05/31/2023)  Transportation Needs: Patient Declined (05/31/2023)  Utilities: Patient Declined (05/31/2023)  Tobacco Use: Medium Risk (05/31/2023)    Readmission Risk Interventions    04/21/2022    1:55 PM  Readmission Risk Prevention Plan  Post Dischage Appt Complete  Medication Screening Complete  Transportation Screening Complete

## 2023-06-10 NOTE — TOC Progression Note (Signed)
Transition of Care [facility]) - Progression Note    Patient Details  Name: Richard Carter MRN: [id number] Date of Birth: [birth year]  Transition of Care [facility]) CM/SW Contact  Garret Reddish, RN Phone Number: 06/10/2023, 1:32 PM  Clinical Narrative:   Received text from Gavin Pound with [facility].  Gavin Pound informs me that their admission team is off site today and will not be able to accept patient today.  Gavin Pound reports that she can accept patient on tomorrow.  I have informed Dr. Mayford Knife of the above information.      Expected Discharge Plan: Home w Home Health Services Barriers to Discharge: No Barriers Identified  Expected Discharge Plan and Services   Discharge Planning Services: CM Consult Post Acute Care Choice: Home Health Living arrangements for the past 2 months: Single Family Home                   DME Agency:  (Patient has a BSC, wheelchair and 2 wheeled rollling walker at home.)       HH Arranged:  (Patient is active with [facility])           Social Determinants of Health (SDOH) Interventions SDOH Screenings   Food Insecurity: Patient Declined (05/31/2023)  Housing: Patient Declined (05/31/2023)  Transportation Needs: Patient Declined (05/31/2023)  Utilities: Patient Declined (05/31/2023)  Tobacco Use: Medium Risk (05/31/2023)    Readmission Risk Interventions    04/21/2022    1:55 PM  Readmission Risk Prevention Plan  Post Dischage Appt Complete  Medication Screening Complete  Transportation Screening Complete

## 2023-06-10 NOTE — Plan of Care (Signed)
  Problem: Education: Goal: Knowledge of disease or condition will improve 06/10/2023 0645 by Smiley Houseman, LPN Outcome: Progressing 06/10/2023 0058 by Smiley Houseman, LPN Outcome: Progressing Goal: Knowledge of secondary prevention will improve (MUST DOCUMENT ALL) 06/10/2023 0645 by Smiley Houseman, LPN Outcome: Progressing 06/10/2023 0058 by Smiley Houseman, LPN Outcome: Progressing Goal: Knowledge of patient specific risk factors will improve Loraine Leriche N/A or DELETE if not current risk factor) 06/10/2023 0645 by Smiley Houseman, LPN Outcome: Progressing 06/10/2023 0058 by Smiley Houseman, LPN Outcome: Progressing

## 2023-06-10 NOTE — Progress Notes (Signed)
Physical Therapy Treatment Patient Details Name: Richard Carter MRN: [id number] DOB: September 25, 1941 Today's Date: 06/10/2023   History of Present Illness Pt admitted to [facility] on 05/31/23 under observation for c/o stroke like symptoms including: facial droop, dysarthria, and RLE weakness. Pt outside the window for tPA. Imaging significant for: punctate acute to early subacute infarct in the L internal capsule/basal ganglia, and probable late subacute to early chronic infarct in the L corona radiata. Significant PMH includes: hx CVA, carotid endarterectomy (03/2023), Afib on Eliquis, HTN, hLD, mixed vascular and Alzheimer's dementia, seizure disorder, alcohol use disorder (in remission).    PT Comments  Pt resting in recliner upon PT arrival and requesting to get back to bed; agreeable to walking first.  During session pt CGA to min assist with transfers and ambulation 25 feet x2 with RW use (limited distance ambulating d/t pt fatigue; gait impairments noted--see below for details).  Will continue to focus on strengthening, balance, and progressive functional mobility per pt tolerance.     Assistance Recommended at Discharge Frequent or constant Supervision/Assistance  If plan is discharge home, recommend the following:  Can travel by private vehicle    Assistance with cooking/housework;Direct supervision/assist for medications management;Direct supervision/assist for financial management;Assist for transportation;Help with stairs or ramp for entrance;A lot of help with bathing/dressing/bathroom;A little help with walking and/or transfers   Yes  Equipment Recommendations  Other (comment) (TBD at next facility)    Recommendations for Other Services       Precautions / Restrictions Precautions Precautions: Fall Precaution Comments: Aspiration; DNR Restrictions Weight Bearing Restrictions: No     Mobility  Bed Mobility Overal bed mobility: Needs Assistance Bed Mobility: Sit to Supine        Sit to supine: Supervision, HOB elevated   General bed mobility comments: vc's for technique    Transfers Overall transfer level: Needs assistance Equipment used: Rolling walker (2 wheels) Transfers: Sit to/from Stand Sit to Stand: Min guard, Min assist           General transfer comment: x2 trials from recliner; vc's for UE placement; assist to control descent sitting    Ambulation/Gait Ambulation/Gait assistance: Min guard, Min assist Gait Distance (Feet):  (25 feet x2) Assistive device: Rolling walker (2 wheels) Gait Pattern/deviations: Trunk flexed, Shuffle, Decreased step length - right, Decreased step length - left, Decreased stride length Gait velocity: decreased     General Gait Details: vc's to stay closer to RW and for longer R LE step length (R LE step length shorter than L LE step length); assist to steady   Stairs             Wheelchair Mobility     Tilt Bed    Modified Rankin (Stroke Patients Only)       Balance Overall balance assessment: Needs assistance Sitting-balance support: No upper extremity supported, Feet supported Sitting balance-Leahy Scale: Good Sitting balance - Comments: steady reaching within BOS   Standing balance support: Bilateral upper extremity supported, During functional activity, Reliant on assistive device for balance Standing balance-Leahy Scale: Fair Standing balance comment: steady static standing with B UE support on RW                            Cognition Arousal/Alertness: Awake/alert Behavior During Therapy: Flat affect Overall Cognitive Status: No family/caregiver present to determine baseline cognitive functioning  General Comments: Increased time to follow 1 step cues        Exercises      General Comments  Nursing cleared pt for participation in physical therapy.  Pt agreeable to PT session.      Pertinent Vitals/Pain Pain  Assessment Pain Assessment: No/denies pain Pain Intervention(s): Limited activity within patient's tolerance, Monitored during session, Repositioned Vitals (HR and SpO2 on room air) stable and WFL throughout treatment session.    Home Living                          Prior Function            PT Goals (current goals can now be found in the care plan section) Acute Rehab PT Goals Patient Stated Goal: to improve mobility PT Goal Formulation: With patient Time For Goal Achievement: 06/15/23 Potential to Achieve Goals: Good Progress towards PT goals: Progressing toward goals    Frequency    Min 1X/week      PT Plan Current plan remains appropriate    Co-evaluation              AM-PAC PT "6 Clicks" Mobility   Outcome Measure  Help needed turning from your back to your side while in a flat bed without using bedrails?: A Little Help needed moving from lying on your back to sitting on the side of a flat bed without using bedrails?: A Little Help needed moving to and from a bed to a chair (including a wheelchair)?: A Little Help needed standing up from a chair using your arms (e.g., wheelchair or bedside chair)?: A Little Help needed to walk in hospital room?: A Little Help needed climbing 3-5 steps with a railing? : A Lot 6 Click Score: 17    End of Session Equipment Utilized During Treatment: Gait belt Activity Tolerance: Patient tolerated treatment well Patient left: in bed;with call bell/phone within reach;with bed alarm set Nurse Communication: Mobility status;Precautions PT Visit Diagnosis: Unsteadiness on feet (R26.81);Other abnormalities of gait and mobility (R26.89);Muscle weakness (generalized) (M62.81);History of falling (Z91.81);Difficulty in walking, not elsewhere classified (R26.2);Hemiplegia and hemiparesis Hemiplegia - Right/Left: Right Hemiplegia - caused by: Cerebral infarction     Time: 1914-7829 PT Time Calculation (min) (ACUTE ONLY):  19 min  Charges:    $Gait Training: 8-22 mins PT General Charges $$ ACUTE PT VISIT: 1 Visit                     Hendricks Limes, PT 06/10/23, 5:28 PM

## 2023-06-10 NOTE — Progress Notes (Signed)
PROGRESS NOTE   HPI was taken from Dr. Huel Cote: Richard Carter is a 82 y.o. male with medical history significant of recurrent CVA with most recent in May 2024 s/p endarterectomy, A-fib on Eliquis, hypertension, hyperlipidemia, mixed vascular and Alzheimer's dementia, seizure disorder, alcohol use disorder in remission, who presents to the ED due to facial droop, dysarthria and right leg weakness.   History obtained from patient's caretaker bedside due to patient's baseline dementia.  Patient's caretaker states that yesterday, patient was at his baseline.  He was working with physical therapy and they were happy with his improvement.  Then today, patient was unable to stand up and when he did, he fell to the ground.  He did not hit his head or lose consciousness.  Dorene Sorrow helped him stand up and he noted that patient was dragging his right leg.  He also noted significant slurred speech.  Due to this, he was brought to the ER.   Dorene Sorrow states that he has noticed Richard Carter is groaning and moaning when he urinates but he does not vocalize any pain particularly. No abdominal pain at this time.  Dorene Sorrow states that Richard Carter has been experiencing nausea with vomiting over the last 2 days, that has responded to oral antiemetics.  Emesis has been nonbloody.   ED course: On arrival to the ED, patient was normotensive at 112/84 with heart rate of 54.  He was saturating at 97% on room air.  He was afebrile at 97.5.  Initial workup demonstrated WBC of 7.9, hemoglobin of 11.0, bicarb 21, creatinine 1.32 with GFR of 54.  Urinalysis with hematuria, large leukocytes, positive nitrites, and many bacteria. Chest x-ray with no acute cardiopulmonary disease.  CT head and CTA of the head with no acute intracranial pathology, however interval left carotid endarterectomy with widely patent common and internal carotid arteries with small focal dissection in the proximal external carotid artery.  Patient started on  ceftriaxone and IV fluids.  Vascular surgery consulted regarding small dissection.  [facility] contacted for admission.   As per Dr. Mayford Knife 7/10-7/16/24: Continue on statin and eliquis for acute CVA. Neuro has signed off. PT/OT recs SNF. SNF cannot accept pt until tomorrow as per CM.    Richard Carter  MWU:[id number] DOB: 1941/04/25 DOA: 05/31/2023 PCP: Marisue Ivan, MD   Assessment & Plan:   Principal Problem:   Acute CVA (cerebrovascular accident) [facility]) Active Problems:   UTI (urinary tract infection)   AKI (acute kidney injury) (HCC)   AF (paroxysmal atrial fibrillation) (HCC)   HTN (hypertension)   Seizures (HCC)   Dementia (HCC)  Assessment and Plan: Acute CVA: presented w/ slurred with right leg weakness. Continue on eliquis, statin. PT/OT recs SNF. Recent CVA s/p left carotid endarterectomy on 04/02/2023. SNF cannot accept pt until tomorrow   Likely PAF: continue on eliquis. Not on any rate controlling meds as per med rec   Dysphagia: likely secondary to above. Continue on dysphagia I diet    Acute UTI: urine culture showed e. coli. Completed abx course   BPH: continue on tamsulosin     HTN: continue on amlodipine    HLD: continue on statin    AKI: Cr is trending down today. Avoid nephrotoxic meds   Seizure disorder: continue on keppra    Dementia: continue w/ supportive care      DVT prophylaxis: eliquis  Code Status: DNR Family Communication:  Disposition Plan: d/c to SNF   Level of care: Telemetry Medical Status is: Inpatient  Remains inpatient appropriate because: medically stable for d/c to SNF. SNF can't accept pt until tomorrow as per CM    Consultants:  Neuro   Procedures:   Antimicrobials:    Subjective: Pt c/o malaise  Objective: Vitals:   06/09/23 0757 06/09/23 1559 06/09/23 2039 06/10/23 0604  BP: 132/65 120/71 123/72 (!) 150/63  Pulse: (!) 52 81 67 (!) 55  Resp: 14 14 18 18   Temp: 97.9 F (36.6 C) 97.9 F (36.6 C) 97.8 F  (36.6 C) 97.9 F (36.6 C)  TempSrc: Oral  Oral Oral  SpO2: 95% 95% 96% 95%  Weight:      Height:        Intake/Output Summary (Last 24 hours) at 06/10/2023 0740 Last data filed at 06/10/2023 0600 Gross per 24 hour  Intake 480 ml  Output 1450 ml  Net -970 ml   Filed Weights   05/31/23 1034  Weight: 74.8 kg    Examination:  General exam: Appears calm & comfortable   Respiratory system: clear breath sounds b/l  Cardiovascular system: S1/S2+. No rubs or clicks  Gastrointestinal system: abd is soft, NT, ND & normal bowel sounds  Central nervous system: alert & awake. Moves all extremities  Psychiatry: judgement and insight appears at baseline. Flat mood and affect    Data Reviewed: I have personally reviewed following labs and imaging studies  CBC: Recent Labs  Lab 06/05/23 0343 06/06/23 0403 06/07/23 0626 06/08/23 0508 06/09/23 0431  WBC 8.0 9.7 9.1 6.6 7.4  HGB 9.9* 10.4* 9.8* 9.6* 9.5*  HCT 30.2* 31.7* 29.9* 29.2* 29.1*  MCV 87.5 87.3 87.4 87.4 87.1  PLT 237 261 282 211 309   Basic Metabolic Panel: Recent Labs  Lab 06/05/23 0343 06/06/23 0403 06/07/23 0626 06/08/23 0508 06/09/23 0431  NA 137 135 136 138 136  K 4.0 4.1 4.6 4.8 4.6  CL 107 106 107 108 106  CO2 22 22 22 23 23   GLUCOSE 93 92 97 93 89  BUN 31* 32* 40* 43* 43*  CREATININE 1.13 1.15 1.23 1.33* 1.31*  CALCIUM 9.2 9.2 9.3 9.2 9.4   GFR: Estimated Creatinine Clearance: 39.9 mL/min (A) (by C-G formula based on SCr of 1.31 mg/dL (H)). Liver Function Tests: No results for input(s): "AST", "ALT", "ALKPHOS", "BILITOT", "PROT", "ALBUMIN" in the last 168 hours.  No results for input(s): "LIPASE", "AMYLASE" in the last 168 hours. No results for input(s): "AMMONIA" in the last 168 hours. Coagulation Profile: No results for input(s): "INR", "PROTIME" in the last 168 hours. Cardiac Enzymes: No results for input(s): "CKTOTAL", "CKMB", "CKMBINDEX", "TROPONINI" in the last 168 hours. BNP (last 3  results) No results for input(s): "PROBNP" in the last 8760 hours. HbA1C: No results for input(s): "HGBA1C" in the last 72 hours. CBG: No results for input(s): "GLUCAP" in the last 168 hours.  Lipid Profile: No results for input(s): "CHOL", "HDL", "LDLCALC", "TRIG", "CHOLHDL", "LDLDIRECT" in the last 72 hours. Thyroid Function Tests: No results for input(s): "TSH", "T4TOTAL", "FREET4", "T3FREE", "THYROIDAB" in the last 72 hours. Anemia Panel: No results for input(s): "VITAMINB12", "FOLATE", "FERRITIN", "TIBC", "IRON", "RETICCTPCT" in the last 72 hours. Sepsis Labs: No results for input(s): "PROCALCITON", "LATICACIDVEN" in the last 168 hours.  Recent Results (from the past 240 hour(s))  Urine Culture     Status: Abnormal   Collection Time: 05/31/23 11:58 AM   Specimen: Urine, Clean Catch  Result Value Ref Range Status   Specimen Description   Final    URINE, CLEAN CATCH Performed  at [facility], [address] [place], [place] [address]    Special Requests   Final    NONE Performed at [facility], [address] [place], [place] [address]    Culture >=100,000 COLONIES/mL ESCHERICHIA COLI (A)  Final   Report Status 06/02/2023 FINAL  Final   Organism ID, Bacteria ESCHERICHIA COLI (A)  Final      Susceptibility   Escherichia coli - MIC*    AMPICILLIN 16 INTERMEDIATE Intermediate     CEFAZOLIN <=4 SENSITIVE Sensitive     CEFEPIME <=0.12 SENSITIVE Sensitive     CEFTRIAXONE <=0.25 SENSITIVE Sensitive     CIPROFLOXACIN >=4 RESISTANT Resistant     GENTAMICIN <=1 SENSITIVE Sensitive     IMIPENEM 1 SENSITIVE Sensitive     NITROFURANTOIN 32 SENSITIVE Sensitive     TRIMETH/SULFA >=320 RESISTANT Resistant     AMPICILLIN/SULBACTAM 8 SENSITIVE Sensitive     PIP/TAZO <=4 SENSITIVE Sensitive     * >=100,000 COLONIES/mL ESCHERICHIA COLI         Radiology Studies: No results found.      Scheduled Meds:  amLODipine  10 mg Oral Daily   apixaban  5 mg  Oral BID   atorvastatin  20 mg Oral QPM   levETIRAcetam  500 mg Oral BID   mouth rinse  15 mL Mouth Rinse 4 times per day   pantoprazole (PROTONIX) IV  40 mg Intravenous Q12H   polyethylene glycol  17 g Oral Daily   tamsulosin  0.4 mg Oral QPC breakfast   traZODone  100 mg Oral QHS   Continuous Infusions:   LOS: 9 days    Time spent: 25 mins     Charise Killian, MD [facility] Pager [phone]  If 7PM-7AM, please contact night-coverage [email or web address] 06/10/2023, 7:40 AM

## 2023-06-10 NOTE — Plan of Care (Signed)
  Problem: Education: Goal: Knowledge of disease or condition will improve Outcome: Progressing Goal: Knowledge of secondary prevention will improve (MUST DOCUMENT ALL) Outcome: Progressing Goal: Knowledge of patient specific risk factors will improve (Mark N/A or DELETE if not current risk factor) Outcome: Progressing   

## 2023-06-10 NOTE — Care Management Important Message (Signed)
Important Message  Patient Details  Name: Richard Carter MRN: [id number] Date of Birth: 1941-01-25   Medicare Important Message Given:  Yes  Late entry due to wrong selection made on previous note. I reviewed the Important Message from Medicare with HCPOA, Renaldo Reel by phone [phone]) on 7/15 @ 2:44 pm. He was in agreement with the upcoming discharge and I thanked him for his time.    Olegario Messier A Jenniah Bhavsar 06/10/2023, 9:36 AM

## 2023-06-11 DIAGNOSIS — G40909 Epilepsy, unspecified, not intractable, without status epilepticus: Secondary | ICD-10-CM | POA: Diagnosis not present

## 2023-06-11 DIAGNOSIS — E785 Hyperlipidemia, unspecified: Secondary | ICD-10-CM | POA: Diagnosis not present

## 2023-06-11 DIAGNOSIS — K922 Gastrointestinal hemorrhage, unspecified: Secondary | ICD-10-CM | POA: Diagnosis not present

## 2023-06-11 DIAGNOSIS — G459 Transient cerebral ischemic attack, unspecified: Secondary | ICD-10-CM | POA: Diagnosis not present

## 2023-06-11 DIAGNOSIS — Z7401 Bed confinement status: Secondary | ICD-10-CM | POA: Diagnosis not present

## 2023-06-11 DIAGNOSIS — N179 Acute kidney failure, unspecified: Secondary | ICD-10-CM | POA: Diagnosis not present

## 2023-06-11 DIAGNOSIS — I480 Paroxysmal atrial fibrillation: Secondary | ICD-10-CM | POA: Diagnosis not present

## 2023-06-11 DIAGNOSIS — W19XXXA Unspecified fall, initial encounter: Secondary | ICD-10-CM | POA: Diagnosis not present

## 2023-06-11 DIAGNOSIS — R1310 Dysphagia, unspecified: Secondary | ICD-10-CM | POA: Diagnosis not present

## 2023-06-11 DIAGNOSIS — G4700 Insomnia, unspecified: Secondary | ICD-10-CM | POA: Diagnosis not present

## 2023-06-11 DIAGNOSIS — I10 Essential (primary) hypertension: Secondary | ICD-10-CM | POA: Diagnosis not present

## 2023-06-11 DIAGNOSIS — I639 Cerebral infarction, unspecified: Secondary | ICD-10-CM | POA: Diagnosis not present

## 2023-06-11 MED ORDER — PANTOPRAZOLE SODIUM 40 MG PO TBEC: 40.0000 mg | DELAYED_RELEASE_TABLET | Freq: Every day | ORAL | Status: DC

## 2023-06-11 MED ORDER — POLYETHYLENE GLYCOL 3350 17 G PO PACK: 17.0000 g | PACK | Freq: Every day | ORAL | Status: DC | PRN

## 2023-06-11 MED ORDER — SENNOSIDES-DOCUSATE SODIUM 8.6-50 MG PO TABS: 1.0000 | ORAL_TABLET | Freq: Every evening | ORAL | Status: DC | PRN

## 2023-06-11 MED ORDER — TAMSULOSIN HCL 0.4 MG PO CAPS: 0.4000 mg | ORAL_CAPSULE | Freq: Every day | ORAL | Status: DC

## 2023-06-11 NOTE — Plan of Care (Signed)
  Problem: Education: Goal: Knowledge of disease or condition will improve Outcome: Progressing Goal: Knowledge of secondary prevention will improve (MUST DOCUMENT ALL) Outcome: Progressing Goal: Knowledge of patient specific risk factors will improve (Mark N/A or DELETE if not current risk factor) Outcome: Progressing   Problem: Ischemic Stroke/TIA Tissue Perfusion: Goal: Complications of ischemic stroke/TIA will be minimized Outcome: Progressing   Problem: Coping: Goal: Will verbalize positive feelings about self Outcome: Progressing Goal: Will identify appropriate support needs Outcome: Progressing   Problem: Health Behavior/Discharge Planning: Goal: Ability to manage health-related needs will improve Outcome: Progressing Goal: Goals will be collaboratively established with patient/family Outcome: Progressing   Problem: Self-Care: Goal: Ability to participate in self-care as condition permits will improve Outcome: Progressing Goal: Verbalization of feelings and concerns over difficulty with self-care will improve Outcome: Progressing Goal: Ability to communicate needs accurately will improve Outcome: Progressing   Problem: Nutrition: Goal: Risk of aspiration will decrease Outcome: Progressing Goal: Dietary intake will improve Outcome: Progressing   Problem: Education: Goal: Knowledge of General Education information will improve Description: Including pain rating scale, medication(s)/side effects and non-pharmacologic comfort measures Outcome: Progressing   Problem: Health Behavior/Discharge Planning: Goal: Ability to manage health-related needs will improve Outcome: Progressing   Problem: Clinical Measurements: Goal: Ability to maintain clinical measurements within normal limits will improve Outcome: Progressing Goal: Will remain free from infection Outcome: Progressing Goal: Diagnostic test results will improve Outcome: Progressing Goal: Respiratory  complications will improve Outcome: Progressing Goal: Cardiovascular complication will be avoided Outcome: Progressing   Problem: Activity: Goal: Risk for activity intolerance will decrease Outcome: Progressing   Problem: Nutrition: Goal: Adequate nutrition will be maintained Outcome: Progressing   Problem: Coping: Goal: Level of anxiety will decrease Outcome: Progressing   Problem: Elimination: Goal: Will not experience complications related to bowel motility Outcome: Progressing Goal: Will not experience complications related to urinary retention Outcome: Progressing   Problem: Pain Managment: Goal: General experience of comfort will improve Outcome: Progressing   Problem: Safety: Goal: Ability to remain free from injury will improve Outcome: Progressing   Problem: Skin Integrity: Goal: Risk for impaired skin integrity will decrease Outcome: Progressing   Problem: Education: Goal: Knowledge of disease or condition will improve Outcome: Progressing Goal: Knowledge of secondary prevention will improve (MUST DOCUMENT ALL) Outcome: Progressing Goal: Knowledge of patient specific risk factors will improve (Mark N/A or DELETE if not current risk factor) Outcome: Progressing   Problem: Ischemic Stroke/TIA Tissue Perfusion: Goal: Complications of ischemic stroke/TIA will be minimized Outcome: Progressing   Problem: Coping: Goal: Will verbalize positive feelings about self Outcome: Progressing Goal: Will identify appropriate support needs Outcome: Progressing   Problem: Health Behavior/Discharge Planning: Goal: Ability to manage health-related needs will improve Outcome: Progressing Goal: Goals will be collaboratively established with patient/family Outcome: Progressing   Problem: Self-Care: Goal: Ability to participate in self-care as condition permits will improve Outcome: Progressing Goal: Verbalization of feelings and concerns over difficulty with self-care  will improve Outcome: Progressing Goal: Ability to communicate needs accurately will improve Outcome: Progressing   Problem: Nutrition: Goal: Risk of aspiration will decrease Outcome: Progressing Goal: Dietary intake will improve Outcome: Progressing   

## 2023-06-11 NOTE — Plan of Care (Signed)
Problem: Education: Goal: Knowledge of disease or condition will improve 06/11/2023 0620 by Smiley Houseman, LPN Outcome: Progressing 06/11/2023 0106 by Smiley Houseman, LPN Outcome: Progressing Goal: Knowledge of secondary prevention will improve (MUST DOCUMENT ALL) 06/11/2023 0620 by Smiley Houseman, LPN Outcome: Progressing 06/11/2023 0106 by Smiley Houseman, LPN Outcome: Progressing Goal: Knowledge of patient specific risk factors will improve Richard Carter N/A or DELETE if not current risk factor) 06/11/2023 0620 by Smiley Houseman, LPN Outcome: Progressing 06/11/2023 0106 by Smiley Houseman, LPN Outcome: Progressing   Problem: Ischemic Stroke/TIA Tissue Perfusion: Goal: Complications of ischemic stroke/TIA will be minimized 06/11/2023 0620 by Smiley Houseman, LPN Outcome: Progressing 06/11/2023 0106 by Smiley Houseman, LPN Outcome: Progressing   Problem: Coping: Goal: Will verbalize positive feelings about self 06/11/2023 0620 by Smiley Houseman, LPN Outcome: Progressing 06/11/2023 0106 by Smiley Houseman, LPN Outcome: Progressing Goal: Will identify appropriate support needs 06/11/2023 0620 by Smiley Houseman, LPN Outcome: Progressing 06/11/2023 0106 by Smiley Houseman, LPN Outcome: Progressing   Problem: Health Behavior/Discharge Planning: Goal: Ability to manage health-related needs will improve 06/11/2023 0620 by Smiley Houseman, LPN Outcome: Progressing 06/11/2023 0106 by Smiley Houseman, LPN Outcome: Progressing Goal: Goals will be collaboratively established with patient/family 06/11/2023 0620 by Smiley Houseman, LPN Outcome: Progressing 06/11/2023 0106 by Smiley Houseman, LPN Outcome: Progressing   Problem: Self-Care: Goal: Ability to participate in self-care as condition permits will improve 06/11/2023 0620 by Smiley Houseman, LPN Outcome: Progressing 06/11/2023 0106 by Smiley Houseman, LPN Outcome: Progressing Goal: Verbalization of feelings and concerns over difficulty with self-care will improve 06/11/2023 0620 by Smiley Houseman, LPN Outcome: Progressing 06/11/2023 0106 by Smiley Houseman, LPN Outcome: Progressing Goal: Ability to communicate needs accurately will improve 06/11/2023 0620 by Smiley Houseman, LPN Outcome: Progressing 06/11/2023 0106 by Smiley Houseman, LPN Outcome: Progressing   Problem: Nutrition: Goal: Risk of aspiration will decrease 06/11/2023 0620 by Smiley Houseman, LPN Outcome: Progressing 06/11/2023 0106 by Smiley Houseman, LPN Outcome: Progressing Goal: Dietary intake will improve 06/11/2023 0620 by Smiley Houseman, LPN Outcome: Progressing 06/11/2023 0106 by Smiley Houseman, LPN Outcome: Progressing   Problem: Education: Goal: Knowledge of General Education information will improve Description: Including pain rating scale, medication(s)/side effects and non-pharmacologic comfort measures 06/11/2023 0620 by Smiley Houseman, LPN Outcome: Progressing 06/11/2023 0106 by Smiley Houseman, LPN Outcome: Progressing   Problem: Health Behavior/Discharge Planning: Goal: Ability to manage health-related needs will improve 06/11/2023 0620 by Smiley Houseman, LPN Outcome: Progressing 06/11/2023 0106 by Smiley Houseman, LPN Outcome: Progressing   Problem: Clinical Measurements: Goal: Ability to maintain clinical measurements within normal limits will improve 06/11/2023 0620 by Smiley Houseman, LPN Outcome: Progressing 06/11/2023 0106 by Smiley Houseman, LPN Outcome: Progressing Goal: Will remain free from infection 06/11/2023 0620 by Smiley Houseman, LPN Outcome: Progressing 06/11/2023 0106 by Smiley Houseman, LPN Outcome: Progressing Goal: Diagnostic test results will improve 06/11/2023 0620 by Smiley Houseman, LPN Outcome: Progressing 06/11/2023 0106 by Smiley Houseman, LPN Outcome: Progressing Goal: Respiratory complications will improve 06/11/2023 0620 by Smiley Houseman, LPN Outcome: Progressing 06/11/2023 0106 by Smiley Houseman, LPN Outcome: Progressing Goal: Cardiovascular complication will be avoided 06/11/2023 0620 by Smiley Houseman,  LPN Outcome: Progressing 06/11/2023 0106 by Smiley Houseman, LPN Outcome: Progressing   Problem: Activity: Goal: Risk for activity intolerance will decrease 06/11/2023 0620 by Smiley Houseman, LPN Outcome: Progressing  06/11/2023 0106 by Smiley Houseman, LPN Outcome: Progressing   Problem: Nutrition: Goal: Adequate nutrition will be maintained 06/11/2023 0620 by Smiley Houseman, LPN Outcome: Progressing 06/11/2023 0106 by Smiley Houseman, LPN Outcome: Progressing   Problem: Coping: Goal: Level of anxiety will decrease 06/11/2023 0620 by Smiley Houseman, LPN Outcome: Progressing 06/11/2023 0106 by Smiley Houseman, LPN Outcome: Progressing   Problem: Elimination: Goal: Will not experience complications related to bowel motility 06/11/2023 0620 by Smiley Houseman, LPN Outcome: Progressing 06/11/2023 0106 by Smiley Houseman, LPN Outcome: Progressing Goal: Will not experience complications related to urinary retention 06/11/2023 0620 by Smiley Houseman, LPN Outcome: Progressing 06/11/2023 0106 by Smiley Houseman, LPN Outcome: Progressing   Problem: Pain Managment: Goal: General experience of comfort will improve 06/11/2023 0620 by Smiley Houseman, LPN Outcome: Progressing 06/11/2023 0106 by Smiley Houseman, LPN Outcome: Progressing   Problem: Safety: Goal: Ability to remain free from injury will improve 06/11/2023 0620 by Smiley Houseman, LPN Outcome: Progressing 06/11/2023 0106 by Smiley Houseman, LPN Outcome: Progressing   Problem: Skin Integrity: Goal: Risk for impaired skin integrity will decrease 06/11/2023 0620 by Smiley Houseman, LPN Outcome: Progressing 06/11/2023 0106 by Smiley Houseman, LPN Outcome: Progressing   Problem: Education: Goal: Knowledge of disease or condition will improve 06/11/2023 0620 by Smiley Houseman, LPN Outcome: Progressing 06/11/2023 0106 by Smiley Houseman, LPN Outcome: Progressing Goal: Knowledge of secondary prevention will improve (MUST DOCUMENT ALL) 06/11/2023 0620 by Smiley Houseman, LPN Outcome:  Progressing 06/11/2023 0106 by Smiley Houseman, LPN Outcome: Progressing Goal: Knowledge of patient specific risk factors will improve Richard Carter N/A or DELETE if not current risk factor) 06/11/2023 0620 by Smiley Houseman, LPN Outcome: Progressing 06/11/2023 0106 by Smiley Houseman, LPN Outcome: Progressing   Problem: Ischemic Stroke/TIA Tissue Perfusion: Goal: Complications of ischemic stroke/TIA will be minimized 06/11/2023 0620 by Smiley Houseman, LPN Outcome: Progressing 06/11/2023 0106 by Smiley Houseman, LPN Outcome: Progressing   Problem: Coping: Goal: Will verbalize positive feelings about self 06/11/2023 0620 by Smiley Houseman, LPN Outcome: Progressing 06/11/2023 0106 by Smiley Houseman, LPN Outcome: Progressing Goal: Will identify appropriate support needs 06/11/2023 0620 by Smiley Houseman, LPN Outcome: Progressing 06/11/2023 0106 by Smiley Houseman, LPN Outcome: Progressing   Problem: Health Behavior/Discharge Planning: Goal: Ability to manage health-related needs will improve 06/11/2023 0620 by Smiley Houseman, LPN Outcome: Progressing 06/11/2023 0106 by Smiley Houseman, LPN Outcome: Progressing Goal: Goals will be collaboratively established with patient/family 06/11/2023 0620 by Smiley Houseman, LPN Outcome: Progressing 06/11/2023 0106 by Smiley Houseman, LPN Outcome: Progressing   Problem: Self-Care: Goal: Ability to participate in self-care as condition permits will improve 06/11/2023 0620 by Smiley Houseman, LPN Outcome: Progressing 06/11/2023 0106 by Smiley Houseman, LPN Outcome: Progressing Goal: Verbalization of feelings and concerns over difficulty with self-care will improve 06/11/2023 0620 by Smiley Houseman, LPN Outcome: Progressing 06/11/2023 0106 by Smiley Houseman, LPN Outcome: Progressing Goal: Ability to communicate needs accurately will improve 06/11/2023 0620 by Smiley Houseman, LPN Outcome: Progressing 06/11/2023 0106 by Smiley Houseman, LPN Outcome: Progressing   Problem: Nutrition: Goal: Risk of  aspiration will decrease 06/11/2023 0620 by Smiley Houseman, LPN Outcome: Progressing 06/11/2023 0106 by Smiley Houseman, LPN Outcome: Progressing Goal: Dietary intake will improve 06/11/2023 0620 by Smiley Houseman, LPN Outcome: Progressing 06/11/2023 0106 by Smiley Houseman, LPN Outcome: Progressing

## 2023-06-11 NOTE — Progress Notes (Signed)
Report called to Byrd Hesselbach at [facility], awaiting EMS for transport

## 2023-06-11 NOTE — Progress Notes (Signed)
Physical Therapy Treatment Patient Details Name: Richard Carter MRN: [id number] DOB: 1941/04/13 Today's Date: 06/11/2023   History of Present Illness Pt admitted to [facility] on 05/31/23 under observation for c/o stroke like symptoms including: facial droop, dysarthria, and RLE weakness. Pt outside the window for tPA. Imaging significant for: punctate acute to early subacute infarct in the L internal capsule/basal ganglia, and probable late subacute to early chronic infarct in the L corona radiata. Significant PMH includes: hx CVA, carotid endarterectomy (03/2023), Afib on Eliquis, HTN, hLD, mixed vascular and Alzheimer's dementia, seizure disorder, alcohol use disorder (in remission).    PT Comments  Patient is agreeable to PT. He was standing up in the room with chair alarm going off on arrival. He was easily redirected to sit and then able to stand again with safety cues provided. Patient walked 76ft with rolling walker with cues for standing closer to rolling walker for support. He is fatigued with activity. Recommend to continue PT to maximize independence and facilitate return to prior level of function.     Assistance Recommended at Discharge Frequent or constant Supervision/Assistance  If plan is discharge home, recommend the following:  Can travel by private vehicle    Assistance with cooking/housework;Direct supervision/assist for medications management;Direct supervision/assist for financial management;Assist for transportation;Help with stairs or ramp for entrance;A lot of help with bathing/dressing/bathroom;A little help with walking and/or transfers   Yes  Equipment Recommendations   (to be determined at next level of care)    Recommendations for Other Services       Precautions / Restrictions Precautions Precautions: Fall Restrictions Weight Bearing Restrictions: No     Mobility  Bed Mobility               General bed mobility comments: not assessed as patient sitting  up on arrival and post session    Transfers Overall transfer level: Needs assistance Equipment used: Rolling walker (2 wheels) Transfers: Sit to/from Stand Sit to Stand: Min guard           General transfer comment: patient standing with rolling walker with chair alarm going off on arrival to room. patient redirected to sit on the chair and was then able to stand with Min guard assistance, cues for hand placement    Ambulation/Gait Ambulation/Gait assistance: Min guard Gait Distance (Feet): 25 Feet Assistive device: Rolling walker (2 wheels) Gait Pattern/deviations: Trunk flexed Gait velocity: decreased     General Gait Details: verbal cues to keep rolling walker closer to base of support. activity tolerance limited by fatigue   Stairs             Wheelchair Mobility     Tilt Bed    Modified Rankin (Stroke Patients Only)       Balance Overall balance assessment: Needs assistance Sitting-balance support: No upper extremity supported, Feet supported Sitting balance-Leahy Scale: Good     Standing balance support: Bilateral upper extremity supported, During functional activity, Reliant on assistive device for balance Standing balance-Leahy Scale: Fair Standing balance comment: with RW for UE support                            Cognition Arousal/Alertness: Awake/alert Behavior During Therapy: Flat affect Overall Cognitive Status: No family/caregiver present to determine baseline cognitive functioning  Exercises      General Comments        Pertinent Vitals/Pain Pain Assessment Pain Assessment: No/denies pain    Home Living                          Prior Function            PT Goals (current goals can now be found in the care plan section) Acute Rehab PT Goals Patient Stated Goal: to go home PT Goal Formulation: With patient Time For Goal Achievement:  06/15/23 Potential to Achieve Goals: Good Progress towards PT goals: Progressing toward goals    Frequency    Min 1X/week      PT Plan Current plan remains appropriate    Co-evaluation              AM-PAC PT "6 Clicks" Mobility   Outcome Measure  Help needed turning from your back to your side while in a flat bed without using bedrails?: A Little Help needed moving from lying on your back to sitting on the side of a flat bed without using bedrails?: A Little Help needed moving to and from a bed to a chair (including a wheelchair)?: A Little Help needed standing up from a chair using your arms (e.g., wheelchair or bedside chair)?: A Little Help needed to walk in hospital room?: A Little Help needed climbing 3-5 steps with a railing? : A Lot 6 Click Score: 17    End of Session   Activity Tolerance: Patient tolerated treatment well Patient left: in chair;with call bell/phone within reach (set-up with lunch tray)   PT Visit Diagnosis: Unsteadiness on feet (R26.81);Other abnormalities of gait and mobility (R26.89);Muscle weakness (generalized) (M62.81);History of falling (Z91.81);Difficulty in walking, not elsewhere classified (R26.2);Hemiplegia and hemiparesis Hemiplegia - Right/Left: Right Hemiplegia - caused by: Cerebral infarction     Time: 1310-1320 PT Time Calculation (min) (ACUTE ONLY): 10 min  Charges:    $Therapeutic Activity: 8-22 mins PT General Charges $$ ACUTE PT VISIT: 1 Visit                     Donna Bernard, PT, MPT    Ina Homes 06/11/2023, 1:45 PM

## 2023-06-11 NOTE — TOC Transition Note (Signed)
Transition of Care [facility]) - CM/SW Discharge Note   Patient Details  Name: Richard Carter MRN: [id number] Date of Birth: September 13, 1941  [facility] CM/SW Contact:  Garret Reddish, RN Phone Number: 06/11/2023, 11:07 AM   Clinical Narrative:   Chart reviewed.  Patient has orders for discharge today.    I have spoken with [name] patient's POA and informed him that [facility] is able to accept patient today.  I have informed [name] that WOM reports that currently their is no co-pay at this time.    I have spoken with Gavin Pound from from [place].  I have sent Deborah patient's Discharge Summary, Discharge orders, and SNF transfer report.  Gavin Pound informs me that patient will be going to room 111P and the number to call report is [phone].    I have arranged EMS transport via [facility].    I have informed staff nurse of the above information.      Final next level of care: Skilled Nursing Facility Barriers to Discharge: No Barriers Identified   Patient Goals and CMS Choice CMS Medicare.gov Compare Post Acute Care list provided to:: Patient Choice offered to / list presented to : Patient, [facility] POA / Guardian  Discharge Placement                Patient chooses bed at: [facility] Patient to be transferred to facility by: [facility] Name of family member notified: Renaldo Reel [facility]) Patient and family notified of of transfer: 06/11/23  Discharge Plan and Services Additional resources added to the After Visit Summary for     Discharge Planning Services: CM Consult Post Acute Care Choice: Home Health            DME Agency:  (Patient has a BSC, wheelchair and 2 wheeled rollling walker at home.)       HH Arranged:  (Patient is active with [facility])          Social Determinants of Health (SDOH) Interventions SDOH Screenings   Food Insecurity: Patient Declined (05/31/2023)  Housing: Patient Declined  (05/31/2023)  Transportation Needs: Patient Declined (05/31/2023)  Utilities: Patient Declined (05/31/2023)  Tobacco Use: Medium Risk (05/31/2023)     Readmission Risk Interventions    04/21/2022    1:55 PM  Readmission Risk Prevention Plan  Post Dischage Appt Complete  Medication Screening Complete  Transportation Screening Complete

## 2023-06-11 NOTE — Discharge Summary (Signed)
Physician Discharge Summary   Patient: Richard Carter MRN: [id number] DOB: May 20, 1941  Admit date:     05/31/2023  Discharge date: 06/11/23  Discharge Physician: Lurene Shadow   PCP: Marisue Ivan, MD   Recommendations at discharge:   Follow-up with physician at the nursing home within 3 days of discharge  Discharge Diagnoses: Principal Problem:   Acute CVA (cerebrovascular accident) [facility]) Active Problems:   UTI (urinary tract infection)   AKI (acute kidney injury) (HCC)   AF (paroxysmal atrial fibrillation) (HCC)   HTN (hypertension)   Seizures (HCC)   Dementia (HCC)  Resolved Problems:   * No resolved hospital problems. *  Hospital Course:  Richard Carter is a 82 y.o. male  with medical history significant of recurrent CVA with most recent in May 2024 s/p endarterectomy, A-fib on Eliquis, hypertension, hyperlipidemia, mixed vascular and Alzheimer's dementia, seizure disorder, alcohol use disorder in remission.  He was recently discharged from the hospital on 04/07/2023 after hospitalization on 03/27/2023 for acute stroke in the s/p left carotid endarterectomy on 04/02/2023.  He presented to the hospital with facial droop, slurred speech and right leg weakness.  Reportedly, he had been working with therapist at home.  Caretakers noticed that patient was unable to stand and when he tried to stand and he fell to the ground.  He was brought to the emergency department for further management.  He was admitted to the hospital for acute recurrent stroke and was also found to have acute UTI.  Neurologist recommended patient continue with Eliquis.  He was treated with empiric IV antibiotics.  Urine culture showed E. coli and he completed a course of IV ceftriaxone followed by Keflex.  Assessment and Plan:   Acute stroke, recurrent: Speech is still slurred with right leg weakness has improved.  Continue Eliquis and Lipitor.  Continue PT, OT and ST. Recent stroke s/p left carotid  endarterectomy on 04/02/2023 Dysphagia: Speech therapist recommended dysphagia 1 diet and nectar thick liquids. MRI brain: Punctate acute to early subacute infarct in the left internal capsule/basal ganglia, and probable late subacute to early chronic infarct in the left corona radiata     Acute UTI: Urine culture showed E. coli.  He completed treatment with IV ceftriaxone followed by cephalexin. BPH: Continue Flomax.  Mild prostatomegaly on CT renal stone study.     Hypertension: Continue amlodipine Sinus bradycardia: Asymptomatic    AKI: Improved     Seizure disorder: Continue Keppra     Dementia: Continue supportive care   His condition has improved and he's deemed stable for discharge to SNF today. Discharge plan discussed with [name], over the phone.     Diet Order            Consultants: Neurologist Procedures performed: None Disposition: Skilled nursing facility Diet recommendation:  Dysphagia type 1 nectar thick Liquid DISCHARGE MEDICATION: Allergies as of 06/11/2023   No Known Allergies      Medication List     STOP taking these medications    iron polysaccharides 150 MG capsule Commonly known as: NIFEREX       TAKE these medications    amLODipine 10 MG tablet Commonly known as: NORVASC Take 1 tablet (10 mg total) by mouth daily.   apixaban 5 MG Tabs tablet Commonly known as: ELIQUIS Take 1 tablet (5 mg total) by mouth 2 (two) times daily.   ascorbic acid 500 MG tablet Commonly known as: VITAMIN C Take 1 tablet (500 mg total) by mouth daily.  atorvastatin 20 MG tablet Commonly known as: LIPITOR Take 20 mg by mouth every evening.   cyanocobalamin 1000 MCG tablet Take 1 tablet (1,000 mcg total) by mouth daily.   food thickener Gel Commonly known as: SIMPLYTHICK (NECTAR/LEVEL 2/MILDLY THICK) Take 1 packet by mouth as needed.   levETIRAcetam 500 MG tablet Commonly known as: KEPPRA Take 1 tablet (500 mg total) by mouth 2  (two) times daily.   pantoprazole 40 MG tablet Commonly known as: Protonix Take 1 tablet (40 mg total) by mouth daily.   polyethylene glycol 17 g packet Commonly known as: MIRALAX / GLYCOLAX Take 17 g by mouth daily as needed.   senna-docusate 8.6-50 MG tablet Commonly known as: Senokot-S Take 1 tablet by mouth at bedtime as needed for mild constipation or moderate constipation.   tamsulosin 0.4 MG Caps capsule Commonly known as: FLOMAX Take 1 capsule (0.4 mg total) by mouth daily after breakfast.   traZODone 100 MG tablet Commonly known as: DESYREL Take 1 tablet (100 mg total) by mouth at bedtime.        Discharge Exam: Filed Weights   05/31/23 1034  Weight: 74.8 kg   GEN: NAD SKIN: No rash EYES: EOMI ENT: MMM CV: RRR PULM: CTA B ABD: soft, ND, NT, +BS CNS: AAO x 3, slurred speech, mild right hemiparesis (RUE 4/5, RLE- 4/5) EXT: No edema or tenderness   Condition at discharge: stable  The results of significant diagnostics from this hospitalization (including imaging, microbiology, ancillary and laboratory) are listed below for reference.   Imaging Studies: CT HEAD WO CONTRAST ( )  Result Date: 06/01/2023 CLINICAL DATA:  Follow-up.  Worsening weakness. EXAM: CT HEAD WITHOUT CONTRAST TECHNIQUE: Contiguous axial images were obtained from the base of the skull through the vertex without intravenous contrast. RADIATION DOSE REDUCTION: This exam was performed according to the departmental dose-optimization program which includes automated exposure control, adjustment of the mA and/or kV according to patient size and/or use of iterative reconstruction technique. COMPARISON:  05/31/2023. FINDINGS: Brain: Well-defined hypoattenuation focus in the left centrum semiovale corresponds to a small focus of increased diffusion signal on the previous day's exam. No new infarcts since the previous exams. No parenchymal masses or mass effect. Old left frontal lobe infarct. Several  old left basal ganglia and deep white matter lacunar infarcts. Small old right parietal lobe infarct. No hydrocephalus. Ventricles and sulci enlarged reflecting moderate atrophy. No extra-axial masses or abnormal fluid collections. No intracranial hemorrhage. Vascular: No hyperdense vessel or unexpected calcification. Skull: Normal. Negative for fracture or focal lesion. Sinuses/Orbits: Globes and orbits are unremarkable. Sinuses are clear. Other: None. IMPRESSION: 1. No interval change from the exam performed on 05/31/2023. No evidence of a new infarct or of infarct extension. No intracranial hemorrhage. Electronically Signed   By: Amie Portland M.D.   On: 06/01/2023 10:28   MR BRAIN WO CONTRAST  Result Date: 05/31/2023 CLINICAL DATA:  Difficulty standing up EXAM: MRI HEAD WITHOUT CONTRAST TECHNIQUE: Multiplanar, multiecho pulse sequences of the brain and surrounding structures were obtained without intravenous contrast. COMPARISON:  Same-day CT/CTA head and neck, brain MRI 03/27/2023 FINDINGS: Brain: There is a small focus of elevated DWI signal in the left corona radiata surrounding a cystic space on the T2 sequence. The cystic space is new since the brain MRI from 03/27/2023. Findings favored to reflect a late subacute to early chronic lacunar infarct. There is an additional punctate focus of diffusion restriction in the left internal capsule/lentiform nucleus (5-23). There is no other  evidence of acute infarct. There is no acute intracranial hemorrhage or extra-axial fluid collection. There is parenchymal volume loss with prominence of the ventricular system and extra-axial CSF spaces. There is unchanged moderate background chronic small-vessel ischemic change. Remote cortical infarcts in the left frontal and right parietal lobes are again seen. A small remote infarct in the pons is unchanged. There is remote hemorrhage in the left basal ganglia. Numerous additional punctate chronic microhemorrhages are also  noted, possibly hypertensive in etiology. The pituitary and suprasellar region are normal. There is no mass lesion or abnormal enhancement. There is no mass effect or midline shift. Vascular: Normal flow voids. Skull and upper cervical spine: Normal marrow signal. Sinuses/Orbits: The paranasal sinuses are clear. The globes and orbits are unremarkable. Other: The mastoid air cells and middle ear cavities are clear. A right nasopharyngeal retention cyst is unchanged. IMPRESSION: 1. Punctate acute to early subacute infarct in the left internal capsule/basal ganglia, and probable late subacute to early chronic infarct in the left corona radiata. 2. Additional remote infarcts, background chronic small-vessel ischemic change, and parenchymal volume loss are not significantly changed. Electronically Signed   By: Lesia Hausen M.D.   On: 05/31/2023 16:28   CT RENAL STONE STUDY  Result Date: 05/31/2023 CLINICAL DATA:  Abdominal/flank pain, stone suspected EXAM: CT ABDOMEN AND PELVIS WITHOUT CONTRAST TECHNIQUE: Multidetector CT imaging of the abdomen and pelvis was performed following the standard protocol without IV contrast. RADIATION DOSE REDUCTION: This exam was performed according to the departmental dose-optimization program which includes automated exposure control, adjustment of the mA and/or kV according to patient size and/or use of iterative reconstruction technique. COMPARISON:  08/24/2022 CT, 04/01/2023 ultrasound FINDINGS: Lower chest: Mild bibasilar subsegmental atelectasis. Heart size within normal limits. Aortic and coronary artery atherosclerosis. Moderate hiatal hernia. Hepatobiliary: No focal liver abnormality identified on unenhanced images. Cholelithiasis. No pericholecystic inflammatory changes by CT. No biliary dilatation. Pancreas: Unremarkable. No pancreatic ductal dilatation or surrounding inflammatory changes. Spleen: Normal in size without focal abnormality. Adrenals/Urinary Tract: Unremarkable  adrenal glands. Stable bilateral renal cysts, better characterized by recent ultrasound and which do not require further follow-up imaging. There is excreted contrast within bilateral renal collecting systems limiting assessment for the possibility of nonobstructing stones. No hydronephrosis. Excreted contrast within the bladder. Urinary bladder wall appears somewhat thickened and trabeculated. Stomach/Bowel: Moderate hiatal hernia. Stomach is otherwise within normal limits. Appendix appears normal. No evidence of bowel wall thickening, distention, or inflammatory changes. Vascular/Lymphatic: Aortic atherosclerosis. No enlarged abdominal or pelvic lymph nodes. Reproductive: Mild prostatomegaly. Other: No free fluid. No abdominopelvic fluid collection. No pneumoperitoneum. No abdominal wall hernia. Musculoskeletal: No acute or significant osseous findings. Chronic superior endplate compression fracture of L1. Multiple chronic right-sided rib fractures. IMPRESSION: 1. No acute abdominopelvic findings. 2. Excreted contrast within bilateral renal collecting systems limiting assessment for the possibility of nonobstructing stones. No hydronephrosis. 3. Urinary bladder wall appears somewhat thickened and trabeculated, which may be related to chronic bladder outlet obstruction in the setting of mild prostatomegaly. Correlate with urinalysis to exclude cystitis. 4. Cholelithiasis. 5. Moderate hiatal hernia. 6. Aortic atherosclerosis (ICD10-I70.0). Electronically Signed   By: Duanne Guess D.O.   On: 05/31/2023 16:21   CT ANGIO HEAD NECK W WO CM  Result Date: 05/31/2023 CLINICAL DATA:  Recently admitted for stroke and carotid endarterectomy. Presents with falling to the floor, unable to get up. EXAM: CT ANGIOGRAPHY HEAD AND NECK WITH AND WITHOUT CONTRAST TECHNIQUE: Multidetector CT imaging of the head and neck was performed  using the standard protocol during bolus administration of intravenous contrast. Multiplanar CT  image reconstructions and MIPs were obtained to evaluate the vascular anatomy. Carotid stenosis measurements (when applicable) are obtained utilizing NASCET criteria, using the distal internal carotid diameter as the denominator. RADIATION DOSE REDUCTION: This exam was performed according to the departmental dose-optimization program which includes automated exposure control, adjustment of the mA and/or kV according to patient size and/or use of iterative reconstruction technique. CONTRAST:  75mL OMNIPAQUE IOHEXOL 350 MG/ML SOLN COMPARISON:  CT head 03/27/2023, CTA head/neck 02/18/2023, carotid Doppler 03/27/2023 FINDINGS: CT HEAD FINDINGS Brain: There is no evidence of acute intracranial hemorrhage, extra-axial fluid collection, or acute territorial infarct. Background parenchymal volume loss with prominence of the ventricular system and extra-axial CSF spaces is stable. The ventricles are stable in size. Encephalomalacia in the left frontal and right parietal lobes and a small remote infarct in the left basal ganglia/corona radiata are unchanged. Additional confluent hypodensity in the supratentorial white matter consistent with underlying chronic small-vessel ischemic change is stable. The pituitary and suprasellar region are normal. There is no mass lesion. There is no mass effect or midline shift. Vascular: See below. Skull: Normal. Negative for fracture or focal lesion. Sinuses/Orbits: The paranasal sinuses are clear. The globes and orbits are unremarkable. Other: The mastoid air cells and middle ear cavities are clear. Review of the MIP images confirms the above findings CTA NECK FINDINGS Aortic arch: Is calcified plaque in the imaged aortic arch. The origins of the major branch vessels are patent. The subclavian arteries are patent to the level imaged. Right carotid system: The right common, internal, and external carotid arteries are patent with scattered calcified plaque throughout but no hemodynamically  significant stenosis or occlusion. There is no evidence of dissection or aneurysm. Left carotid system: The left common carotid artery is patent with scattered calcified plaque. There has been interval left carotid endarterectomy since the prior CTA from 02/18/2023. The internal carotid artery is widely patent. There is a small focal dissection in the external carotid artery resulting in mild luminal narrowing (9-376). There is no evidence of dissection of the internal carotid artery. There is no evidence of aneurysm. Vertebral arteries: Moderate stenosis of the origin of the right vertebral artery is unchanged. The vertebral arteries are otherwise patent, without other hemodynamically significant stenosis or occlusion. There is no evidence of dissection or aneurysm. Skeleton: There is no acute osseous abnormality or suspicious osseous lesion. Multilevel degenerative change of the cervical spine is again seen. There is no visible canal hematoma. Other neck: The soft tissues of the neck are unremarkable. Upper chest: The imaged lung apices are clear. Review of the MIP images confirms the above findings CTA HEAD FINDINGS Anterior circulation: There is calcified plaque in the intracranial ICAs resulting in mild-to-moderate stenosis bilaterally. The bilateral MCAs and ACAS are patent, without proximal stenosis or occlusion. There is no evidence of dissection or aneurysm. Posterior circulation: The right vertebral artery is diminutive particularly after the PICA origin, but remains patent. This is unchanged. There is calcified plaque at the left V3/V4 junction and proximal V4 segment resulting in up to mild-to-moderate stenosis, unchanged. The distal V4 segment is patent. The basilar artery is patent. The major cerebellar arteries appear patent. The bilateral PCAs are patent, without proximal high-grade stenosis or occlusion. A left posterior communicating artery is identified. There is no aneurysm or AVM. Venous  sinuses: Suboptimally evaluated due to bolus timing. Anatomic variants: None. Review of the MIP images confirms the above  findings IMPRESSION: 1. No acute intracranial pathology on initial noncontrast head CT. 2. Interval left carotid endarterectomy with widely patent common and internal carotid arteries. There is a small focal dissection in the proximal external carotid artery resulting in mild luminal narrowing. 3. Unchanged calcified plaque in the right carotid system without hemodynamically significant stenosis or occlusion. 4. Unchanged moderate stenosis of the right vertebral artery origin. 5. Unchanged calcified plaque in the intracranial ICAs and V4 segments with a diminutive right V4 segment but otherwise no high-grade stenosis or occlusion. Electronically Signed   By: Lesia Hausen M.D.   On: 05/31/2023 12:24   DG Chest Portable 1 View  Result Date: 05/31/2023 CLINICAL DATA:  Weakness EXAM: PORTABLE CHEST 1 VIEW COMPARISON:  03/31/2023 FINDINGS: Heart size within normal limits. Aortic atherosclerosis. No focal airspace consolidation, pleural effusion, or pneumothorax. Multiple old healed bilateral rib fractures. IMPRESSION: No active cardiopulmonary disease. Electronically Signed   By: Duanne Guess D.O.   On: 05/31/2023 12:07   VAS US CAROTID  Result Date: 05/15/2023 Carotid Arterial Duplex Study Patient Name:  MAUREEN DUESING  Date of Exam:   05/12/2023 Medical Rec #: [id number]         Accession #:    [id number] Date of Birth: 31-Jul-1941          Patient Gender: M Patient Age:   24 years Exam Location:  [facility] Procedure:      VAS US CAROTID Referring Phys: Sheppard Plumber --------------------------------------------------------------------------------  Indications:  CVA and Carotid artery disease. Risk Factors: Hypertension, hyperlipidemia, past history of smoking, prior CVA. Performing Technologist: Hardie Lora RVT  Examination Guidelines: A complete evaluation includes B-mode  imaging, spectral Doppler, color Doppler, and power Doppler as needed of all accessible portions of each vessel. Bilateral testing is considered an integral part of a complete examination. Limited examinations for reoccurring indications may be performed as noted.  Right Carotid Findings: +----------+--------+--------+--------+----------------------+--------+           PSV cm/sEDV cm/sStenosisPlaque Description    Comments +----------+--------+--------+--------+----------------------+--------+ CCA Prox  79      15              irregular                      +----------+--------+--------+--------+----------------------+--------+ CCA Mid   66      12      <50%    irregular                      +----------+--------+--------+--------+----------------------+--------+ CCA Distal34      1               irregular                      +----------+--------+--------+--------+----------------------+--------+ ICA Prox  58      12      1-39%   calcific and irregular         +----------+--------+--------+--------+----------------------+--------+ ICA Mid   44      16                                             +----------+--------+--------+--------+----------------------+--------+ ICA Distal49      16                                             +----------+--------+--------+--------+----------------------+--------+  ECA       118     8                                              +----------+--------+--------+--------+----------------------+--------+ +----------+--------+-------+----------------+-------------------+           PSV cm/sEDV cmsDescribe        Arm Pressure (mmHG) +----------+--------+-------+----------------+-------------------+ [id number]     0      Multiphasic, WNL                    +----------+--------+-------+----------------+-------------------+ +---------+--------+--+--------+--+---------+ VertebralPSV cm/s45EDV cm/s11Antegrade  +---------+--------+--+--------+--+---------+  Left Carotid Findings: +----------+--------+--------+--------+----------------------+--------+           PSV cm/sEDV cm/sStenosisPlaque Description    Comments +----------+--------+--------+--------+----------------------+--------+ CCA Prox  62      12              diffuse                        +----------+--------+--------+--------+----------------------+--------+ CCA Mid   84      15      <50%    irregular and calcific         +----------+--------+--------+--------+----------------------+--------+ CCA Distal64      13                                             +----------+--------+--------+--------+----------------------+--------+ ICA Prox  32      7       1-39%   irregular                      +----------+--------+--------+--------+----------------------+--------+ ICA Mid   79      15                                             +----------+--------+--------+--------+----------------------+--------+ ICA Distal63      17                                             +----------+--------+--------+--------+----------------------+--------+ ECA       113     0                                              +----------+--------+--------+--------+----------------------+--------+ +----------+--------+--------+----------------+-------------------+           PSV cm/sEDV cm/sDescribe        Arm Pressure (mmHG) +----------+--------+--------+----------------+-------------------+ [id number]              Multiphasic, WNL                    +----------+--------+--------+----------------+-------------------+ +---------+--------+--+--------+--+---------+ VertebralPSV cm/s75EDV cm/s20Antegrade +---------+--------+--+--------+--+---------+   Summary: Right Carotid: Velocities in the right ICA are consistent with a 1-39% stenosis.                Non-hemodynamically significant plaque <50% noted in the CCA. The  ECA appears <50% stenosed. Left Carotid: Velocities in the left ICA are consistent with a 1-39% stenosis.               Non-hemodynamically significant plaque <50% noted in the CCA. Vertebrals:  Bilateral vertebral arteries demonstrate antegrade flow. Subclavians: Normal flow hemodynamics were seen in bilateral subclavian              arteries. *See table(s) above for measurements and observations.  Electronically signed by [name] MD on 05/15/2023 at 3:23:50 PM.    Final     Microbiology: Results for orders placed or performed during the hospital encounter of 05/31/23  Urine Culture     Status: Abnormal   Collection Time: 05/31/23 11:58 AM   Specimen: Urine, Clean Catch  Result Value Ref Range Status   Specimen Description   Final    URINE, CLEAN CATCH Performed at [facility], [address] [place], [place] [address]    Special Requests   Final    NONE Performed at [facility], [address] [place], [place] [address]    Culture >=100,000 COLONIES/mL ESCHERICHIA COLI (A)  Final   Report Status 06/02/2023 FINAL  Final   Organism ID, Bacteria ESCHERICHIA COLI (A)  Final      Susceptibility   Escherichia coli - MIC*    AMPICILLIN 16 INTERMEDIATE Intermediate     CEFAZOLIN <=4 SENSITIVE Sensitive     CEFEPIME <=0.12 SENSITIVE Sensitive     CEFTRIAXONE <=0.25 SENSITIVE Sensitive     CIPROFLOXACIN >=4 RESISTANT Resistant     GENTAMICIN <=1 SENSITIVE Sensitive     IMIPENEM 1 SENSITIVE Sensitive     NITROFURANTOIN 32 SENSITIVE Sensitive     TRIMETH/SULFA >=320 RESISTANT Resistant     AMPICILLIN/SULBACTAM 8 SENSITIVE Sensitive     PIP/TAZO <=4 SENSITIVE Sensitive     * >=100,000 COLONIES/mL ESCHERICHIA COLI    Labs: CBC: Recent Labs  Lab 06/05/23 0343 06/06/23 0403 06/07/23 0626 06/08/23 0508 06/09/23 0431  WBC 8.0 9.7 9.1 6.6 7.4  HGB 9.9* 10.4* 9.8* 9.6* 9.5*  HCT 30.2* 31.7* 29.9* 29.2* 29.1*  MCV 87.5 87.3 87.4 87.4 87.1  PLT 237  261 282 211 309   Basic Metabolic Panel: Recent Labs  Lab 06/05/23 0343 06/06/23 0403 06/07/23 0626 06/08/23 0508 06/09/23 0431  NA 137 135 136 138 136  K 4.0 4.1 4.6 4.8 4.6  CL 107 106 107 108 106  CO2 22 22 22 23 23   GLUCOSE 93 92 97 93 89  BUN 31* 32* 40* 43* 43*  CREATININE 1.13 1.15 1.23 1.33* 1.31*  CALCIUM 9.2 9.2 9.3 9.2 9.4   Liver Function Tests: No results for input(s): "AST", "ALT", "ALKPHOS", "BILITOT", "PROT", "ALBUMIN" in the last 168 hours. CBG: No results for input(s): "GLUCAP" in the last 168 hours.  Discharge time spent: greater than 30 minutes.  Signed: Lurene Shadow, MD [facility] 06/11/2023

## 2023-06-12 DIAGNOSIS — E785 Hyperlipidemia, unspecified: Secondary | ICD-10-CM | POA: Diagnosis not present

## 2023-06-12 DIAGNOSIS — K922 Gastrointestinal hemorrhage, unspecified: Secondary | ICD-10-CM | POA: Diagnosis not present

## 2023-06-12 DIAGNOSIS — R1310 Dysphagia, unspecified: Secondary | ICD-10-CM | POA: Diagnosis not present

## 2023-06-12 DIAGNOSIS — G40909 Epilepsy, unspecified, not intractable, without status epilepticus: Secondary | ICD-10-CM | POA: Diagnosis not present

## 2023-06-12 DIAGNOSIS — G4700 Insomnia, unspecified: Secondary | ICD-10-CM | POA: Diagnosis not present

## 2023-06-12 DIAGNOSIS — I10 Essential (primary) hypertension: Secondary | ICD-10-CM | POA: Diagnosis not present

## 2023-06-12 DIAGNOSIS — I480 Paroxysmal atrial fibrillation: Secondary | ICD-10-CM | POA: Diagnosis not present

## 2023-06-12 DIAGNOSIS — I639 Cerebral infarction, unspecified: Secondary | ICD-10-CM | POA: Diagnosis not present

## 2023-06-19 DIAGNOSIS — R1310 Dysphagia, unspecified: Secondary | ICD-10-CM | POA: Diagnosis not present

## 2023-06-26 DIAGNOSIS — G40909 Epilepsy, unspecified, not intractable, without status epilepticus: Secondary | ICD-10-CM | POA: Diagnosis not present

## 2023-06-26 DIAGNOSIS — I10 Essential (primary) hypertension: Secondary | ICD-10-CM | POA: Diagnosis not present

## 2023-06-26 DIAGNOSIS — E785 Hyperlipidemia, unspecified: Secondary | ICD-10-CM | POA: Diagnosis not present

## 2023-06-26 DIAGNOSIS — I69354 Hemiplegia and hemiparesis following cerebral infarction affecting left non-dominant side: Secondary | ICD-10-CM | POA: Diagnosis not present

## 2023-06-26 DIAGNOSIS — I480 Paroxysmal atrial fibrillation: Secondary | ICD-10-CM | POA: Diagnosis not present

## 2023-06-26 DIAGNOSIS — R1310 Dysphagia, unspecified: Secondary | ICD-10-CM | POA: Diagnosis not present

## 2023-06-26 DIAGNOSIS — I639 Cerebral infarction, unspecified: Secondary | ICD-10-CM | POA: Diagnosis not present

## 2023-06-26 DIAGNOSIS — K219 Gastro-esophageal reflux disease without esophagitis: Secondary | ICD-10-CM | POA: Diagnosis not present

## 2023-06-26 DIAGNOSIS — N179 Acute kidney failure, unspecified: Secondary | ICD-10-CM | POA: Diagnosis not present

## 2023-06-30 ENCOUNTER — Emergency Department: Payer: Medicare HMO

## 2023-06-30 ENCOUNTER — Other Ambulatory Visit: Payer: Self-pay

## 2023-06-30 ENCOUNTER — Emergency Department
Admission: EM | Admit: 2023-06-30 | Discharge: 2023-06-30 | Disposition: A | Discharge status: Home / Self Care | Payer: Medicare HMO | Attending: Emergency Medicine | Admitting: Emergency Medicine

## 2023-06-30 ENCOUNTER — Encounter: Payer: Self-pay | Admitting: Emergency Medicine

## 2023-06-30 DIAGNOSIS — K5730 Diverticulosis of large intestine without perforation or abscess without bleeding: Secondary | ICD-10-CM | POA: Diagnosis not present

## 2023-06-30 DIAGNOSIS — Z8673 Personal history of transient ischemic attack (TIA), and cerebral infarction without residual deficits: Secondary | ICD-10-CM | POA: Insufficient documentation

## 2023-06-30 DIAGNOSIS — R1110 Vomiting, unspecified: Secondary | ICD-10-CM | POA: Diagnosis not present

## 2023-06-30 DIAGNOSIS — Z7901 Long term (current) use of anticoagulants: Secondary | ICD-10-CM | POA: Insufficient documentation

## 2023-06-30 DIAGNOSIS — I10 Essential (primary) hypertension: Secondary | ICD-10-CM | POA: Diagnosis not present

## 2023-06-30 DIAGNOSIS — R112 Nausea with vomiting, unspecified: Secondary | ICD-10-CM | POA: Diagnosis not present

## 2023-06-30 DIAGNOSIS — R1010 Upper abdominal pain, unspecified: Secondary | ICD-10-CM | POA: Diagnosis not present

## 2023-06-30 DIAGNOSIS — F0390 Unspecified dementia without behavioral disturbance: Secondary | ICD-10-CM | POA: Insufficient documentation

## 2023-06-30 DIAGNOSIS — K8020 Calculus of gallbladder without cholecystitis without obstruction: Secondary | ICD-10-CM | POA: Diagnosis not present

## 2023-06-30 DIAGNOSIS — G9389 Other specified disorders of brain: Secondary | ICD-10-CM | POA: Diagnosis not present

## 2023-06-30 DIAGNOSIS — I672 Cerebral atherosclerosis: Secondary | ICD-10-CM | POA: Diagnosis not present

## 2023-06-30 DIAGNOSIS — I4892 Unspecified atrial flutter: Secondary | ICD-10-CM | POA: Insufficient documentation

## 2023-06-30 DIAGNOSIS — S2242XA Multiple fractures of ribs, left side, initial encounter for closed fracture: Secondary | ICD-10-CM | POA: Diagnosis not present

## 2023-06-30 DIAGNOSIS — K449 Diaphragmatic hernia without obstruction or gangrene: Secondary | ICD-10-CM | POA: Diagnosis not present

## 2023-06-30 DIAGNOSIS — N281 Cyst of kidney, acquired: Secondary | ICD-10-CM | POA: Diagnosis not present

## 2023-06-30 LAB — COMPREHENSIVE METABOLIC PANEL
ALT: 16 U/L (ref 0–44)
AST: 19 U/L (ref 15–41)
Albumin: 4 g/dL (ref 3.5–5.0)
Alkaline Phosphatase: 80 U/L (ref 38–126)
Anion gap: 9 (ref 5–15)
BUN: 24 mg/dL — ABNORMAL HIGH (ref 8–23)
CO2: 23 mmol/L (ref 22–32)
Calcium: 9.9 mg/dL (ref 8.9–10.3)
Chloride: 106 mmol/L (ref 98–111)
Creatinine, Ser: 1.32 mg/dL — ABNORMAL HIGH (ref 0.61–1.24)
GFR, Estimated: 54 mL/min — ABNORMAL LOW (ref >60)
Glucose, Bld: 101 mg/dL — ABNORMAL HIGH (ref 70–99)
Potassium: 4.5 mmol/L (ref 3.5–5.1)
Sodium: 138 mmol/L (ref 135–145)
Total Bilirubin: 0.4 mg/dL (ref 0.3–1.2)
Total Protein: 7.8 g/dL (ref 6.5–8.1)

## 2023-06-30 LAB — URINALYSIS, ROUTINE W REFLEX MICROSCOPIC
Bacteria, UA: NONE SEEN
Bilirubin Urine: NEGATIVE
Glucose, UA: NEGATIVE mg/dL
Ketones, ur: NEGATIVE mg/dL
Nitrite: NEGATIVE
Protein, ur: 30 mg/dL — AB
RBC / HPF: >50 RBC/hpf (ref 0–5)
Specific Gravity, Urine: 1.01 (ref 1.005–1.030)
pH: 6 (ref 5.0–8.0)

## 2023-06-30 LAB — CBC
HCT: 35.3 % — ABNORMAL LOW (ref 39.0–52.0)
Hemoglobin: 11.6 g/dL — ABNORMAL LOW (ref 13.0–17.0)
MCH: 29.3 pg (ref 26.0–34.0)
MCHC: 32.9 g/dL (ref 30.0–36.0)
MCV: 89.1 fL (ref 80.0–100.0)
Platelets: 253 10*3/uL (ref 150–400)
RBC: 3.96 MIL/uL — ABNORMAL LOW (ref 4.22–5.81)
RDW: 17.1 % — ABNORMAL HIGH (ref 11.5–15.5)
WBC: 6.1 10*3/uL (ref 4.0–10.5)
nRBC: 0 % (ref 0.0–0.2)

## 2023-06-30 LAB — LIPASE, BLOOD: Lipase: 38 U/L (ref 11–51)

## 2023-06-30 MED ORDER — ONDANSETRON HCL 4 MG/2ML IJ SOLN
4.0000 mg | INTRAMUSCULAR | Status: AC
  Administered 2023-06-30: 4 mg via INTRAVENOUS
  Filled 2023-06-30: qty 2

## 2023-06-30 MED ORDER — SODIUM CHLORIDE 0.9 % IV BOLUS
500.0000 mL | Freq: Once | INTRAVENOUS | Status: AC
  Administered 2023-06-30: 500 mL via INTRAVENOUS

## 2023-06-30 MED ORDER — IOHEXOL 300 MG/ML  SOLN
100.0000 mL | Freq: Once | INTRAMUSCULAR | Status: AC | PRN
  Administered 2023-06-30: 100 mL via INTRAVENOUS

## 2023-06-30 NOTE — ED Provider Notes (Signed)
[facility] Provider Note    Event Date/Time   First MD Initiated Contact with Patient 06/30/23 1556     (approximate)   History   Emesis  Patient with a history of dementia/mild cognitive difficulty after strokes, some orientations.  Frequently disoriented.  Majority of history is provided by his caretaker who comes with him today and also lives with cross the street with from him  HPI  Richard Carter is a 82 y.o. male history of seizure disorder, alcohol abuse, atrial flutter, prior stroke, dementia, atrial fibrillation, encephalopathy  Patient was released from nursing facility Saturday.  Caretaker advises that he has not been able to keep anything on his stomach ginger ale crackers anything after he eats not long after he will vomit up and is very dark sort of black looking vomitus.  He has not been in any pain.  He has been normal with regard to his ways and orientation but is generally has some confusion related to dementia and prior strokes.  He is now able to keep anything on his stomach.  They called his doctor, Dr. Harlon Flor, and there were some concern about potential obstruction and he recommended he come to the ER for evaluation  Patient denies being any pain.  No headache no abdominal pain no chest pain no shortness of breath.   Recent history of an acute recurrent stroke as well as UTI in July.  Continuing on Eliquis  Physical Exam   Triage Vital Signs: ED Triage Vitals  Encounter Vitals Group     BP 06/30/23 1217 110/67     Systolic BP Percentile --      Diastolic BP Percentile --      Pulse Rate 06/30/23 1217 (!) 52     Resp 06/30/23 1217 18     Temp 06/30/23 1217 97.7 F (36.5 C)     Temp Source 06/30/23 1217 Oral     SpO2 06/30/23 1217 93 %     Weight 06/30/23 1215 140 lb (63.5 kg)     Height 06/30/23 1215 5\' 7"  (1.702 m)     Head Circumference --      Peak Flow --      Pain Score 06/30/23 1215 0     Pain Loc --      Pain  Education --      Exclude from Growth Chart --     Most recent vital signs: Vitals:   06/30/23 1900 06/30/23 1930  BP: 137/69 (!) 148/107  Pulse: (!) 54 68  Resp:  19  Temp:    SpO2: 100% 100%     General: Awake, no distress.  Resting comfortably.  Mucous membranes slightly dry CV:  Good peripheral perfusion.  Normal tone and some rate Resp:  Normal effort.  Clear bilateral with normal work of breathing.  No rales noted Abd:  No distention.  Soft nontender.  No obvious distention. Performed rectal exam with nursing staff.  Brown formed stool in the rectum negative Hemoccult Other:     ED Results / Procedures / Treatments   Labs (all labs ordered are listed, but only abnormal results are displayed) Labs Reviewed  COMPREHENSIVE METABOLIC PANEL - Abnormal; Notable for the following components:      Result Value   Glucose, Bld 101 (*)    BUN 24 (*)    Creatinine, Ser 1.32 (*)    GFR, Estimated 54 (*)    All other components within normal limits  CBC -  Abnormal; Notable for the following components:   RBC 3.96 (*)    Hemoglobin 11.6 (*)    HCT 35.3 (*)    RDW 17.1 (*)    All other components within normal limits  URINALYSIS, ROUTINE W REFLEX MICROSCOPIC - Abnormal; Notable for the following components:   Color, Urine YELLOW (*)    APPearance CLOUDY (*)    Hgb urine dipstick LARGE (*)    Protein, ur 30 (*)    Leukocytes,Ua SMALL (*)    All other components within normal limits  URINE CULTURE  LIPASE, BLOOD     EKG  EKG interpreted by me at 1618 heart rate 60 QRS 100 QTc 430 Slight artifact.  Normal sinus rhythm no evidence of ischemia  RADIOLOGY  CT ABDOMEN PELVIS W CONTRAST  Result Date: 06/30/2023 CLINICAL DATA:  Bowel obstruction suspected EXAM: CT ABDOMEN AND PELVIS WITH CONTRAST TECHNIQUE: Multidetector CT imaging of the abdomen and pelvis was performed using the standard protocol following bolus administration of intravenous contrast. RADIATION DOSE  REDUCTION: This exam was performed according to the departmental dose-optimization program which includes automated exposure control, adjustment of the mA and/or kV according to patient size and/or use of iterative reconstruction technique. CONTRAST:  OMNIPAQUE IOHEXOL 300 MG/ML  SOLN COMPARISON:  CT abdomen pelvis 05/31/2023 or vomiting. Ongoing since Saturday- unable to keep anything down. Denies pain. Hx dementia FINDINGS: Lower chest: Grossly stable moderate to large volume hiatal hernia containing the majority of the of the stomach. Hepatobiliary: No focal liver abnormality. Calcified gallstone noted within the gallbladder lumen. No gallbladder wall thickening or pericholecystic fluid. No biliary dilatation. Pancreas: No focal lesion. Normal pancreatic contour. No surrounding inflammatory changes. No main pancreatic ductal dilatation. Spleen: Normal in size without focal abnormality. Adrenals/Urinary Tract: No adrenal nodule bilaterally. Bilateral kidneys enhance symmetrically. Fluid density lesions likely represent simple renal cysts. Simple renal cysts, in the absence of clinically indicated signs/symptoms, require no independent follow-up. Subcentimeter hypodensities are too small to characterize-no further follow-up indicated. Calcifications associated with kidneys likely vascular. No definite nephroureterolithiasis. No hydronephrosis. No hydroureter. The urinary bladder is unremarkable. Stomach/Bowel: Stomach is within normal limits. No evidence of bowel wall thickening or dilatation. Few scattered colonic diverticula. Appendix appears normal. Vascular/Lymphatic: No abdominal aorta or iliac aneurysm. Severe atherosclerotic plaque of the aorta and its branches. No abdominal, pelvic, or inguinal lymphadenopathy. Reproductive: Prostate is unremarkable. Other: No intraperitoneal free fluid. No intraperitoneal free gas. No organized fluid collection. Musculoskeletal: No abdominal wall hernia or  abnormality. No suspicious lytic or blastic osseous lesions. No acute displaced fracture. Levocurvature of the lumbar spine centered at the L3-L4 level. Stable chronic L1 superior endplate compression fracture. Old healed right pelvic fracture. Old healed right rib fractures. Multilevel degenerative changes of the spine most prominent at the L3-L4 leve with associated endplate sclerosis and intervertebral disc space narrowing. Intervertebral disc space vacuum phenomenon at the L4-L5 level. IMPRESSION: 1. No acute intra-abdominal or intrapelvic abnormality. 2. Grossly stable moderate to large volume hiatal hernia containing the majority of the of the stomach. 3. Colonic diverticulosis with no acute diverticulitis. 4.  Aortic Atherosclerosis (ICD10-I70.0). Electronically Signed   By: Tish Frederickson M.D.   On: 06/30/2023 17:34   CT Head Wo Contrast  Result Date: 06/30/2023 CLINICAL DATA:  ICP elevation suspected, acute (Ped >= 9mo) recent stroke, now with intractable vomting, eval for ich EXAM: CT HEAD WITHOUT CONTRAST TECHNIQUE: Contiguous axial images were obtained from the base of the skull through the vertex without intravenous  contrast. RADIATION DOSE REDUCTION: This exam was performed according to the departmental dose-optimization program which includes automated exposure control, adjustment of the mA and/or kV according to patient size and/or use of iterative reconstruction technique. COMPARISON:  CT head 06/01/2023 FINDINGS: Brain: Cerebral ventricle sizes are concordant with the degree of cerebral volume loss. Patchy and confluent areas of decreased attenuation are noted throughout the deep and periventricular white matter of the cerebral hemispheres bilaterally, compatible with chronic microvascular ischemic disease. Left frontal lobe encephalomalacia. No evidence of large-territorial acute infarction. No parenchymal hemorrhage. No mass lesion. No extra-axial collection. No mass effect or midline shift.  No hydrocephalus. Basilar cisterns are patent. Vascular: No hyperdense vessel. Atherosclerotic calcifications are present within the cavernous internal carotid and vertebral arteries. Skull: No acute fracture or focal lesion. Sinuses/Orbits: Paranasal sinuses and mastoid air cells are clear. The orbits are unremarkable. Other: None. IMPRESSION: No acute intracranial abnormality. Electronically Signed   By: Tish Frederickson M.D.   On: 06/30/2023 17:06   DG Chest 2 View  Result Date: 06/30/2023 CLINICAL DATA:  vomiting, exclude aspiration EXAM: CHEST - 2 VIEW COMPARISON:  CXR 05/31/23 FINDINGS: Pleural effusion. No pneumothorax. Unchanged cardiac and mediastinal contours. No new focal airspace opacity. There are chronic fractures of posterior left ribs 5 and 6. Visualized upper abdomen is unremarkable. IMPRESSION: No new finding to suggest aspiration. Electronically Signed   By: Lorenza Cambridge M.D.   On: 06/30/2023 16:56      PROCEDURES:  Critical Care performed: No  Procedures   MEDICATIONS ORDERED IN ED: Medications  ondansetron (ZOFRAN) injection 4 mg (4 mg Intravenous Given 06/30/23 1621)  sodium chloride 0.9 % bolus 500 mL (0 mLs Intravenous Stopped 06/30/23 1821)  iohexol (OMNIPAQUE) 300 MG/ML solution 100 mL (100 mLs Intravenous Contrast Given 06/30/23 1652)     IMPRESSION / MDM / ASSESSMENT AND PLAN / ED COURSE  I reviewed the triage vital signs and the nursing notes.                              Differential diagnosis includes, but is not limited to, given the history of inability to keep food down per his caretaker as well as frequent episodes of vomiting of dark emesis concern for obstruction, gastritis, peptic ulcer disease, GI bleeding, etc. are high.  He does not demonstrate any acute hemodynamic instability.  His oxygen saturation 93% but his work of breathing appears to be normal.  Will order chest x-ray to evaluate and exclude aspiration given reported frequent vomiting  episodes.  Negative Hemoccult reassuring for no GI bleeding.  Will obtain imaging to evaluate for obstruction or acute intra-abdominal pathology.  No obvious cardiac pulmonary or acute neurologic symptoms.  CT head ordered to evaluate and exclude elevated ICP/intracranial hemorrhage given his recent stroke and now presentation with vomiting but he does not exhibit any symptoms of be suggestive of any acute new neurologic abnormality  Will check urinalysis given history of frequent UTIs.  Patient's presentation is most consistent with acute complicated illness / injury requiring diagnostic workup.   The patient is on the cardiac monitor to evaluate for evidence of arrhythmia and/or significant heart rate changes.  Clinical Course as of 06/30/23 2029  Mon Jun 30, 2023  1557 Labs interpreted as chronic mild renal disease.  Chronic anemia without significant drop.  Normal white count.  Normal LFTs and lipase [MQ]    Clinical Course User Index [MQ] Sharyn Creamer, MD   -----------------------------------------  7:29 PM on 06/30/2023 ----------------------------------------- Workup to this point quite reassuring, a significant hiatal hernia is noted but there is no evidence of acute obstruction or complication on CT.  Discussed with patient and his aide, comfortable with plan to trial p.o. challenge and if he keeps it down and continues to feel well with no further emesis anticipate appropriate for discharge with close outpatient follow-up.  Patient and aide in agreement  Urine sent for culture.  No associated obvious symptoms of dysuria or UTI.  ----------------------------------------- 8:28 PM on 06/30/2023 ----------------------------------------- Patient relates he actually already has a prescription for Zofran that he can use.  He has been able to tolerate an entire clear liquid diet tray here with no further discomfort nausea or emesis.  At this time he is able to hydrate well, reassuring  workup, urine culture sent, and appears appropriate for ongoing outpatient treatment.  Both he and his caretaker are in agreement  Return precautions and treatment recommendations and follow-up discussed with the patient who is agreeable with the plan.  Will follow-up closely with Dr. Burnadette Pop  FINAL CLINICAL IMPRESSION(S) / ED DIAGNOSES   Final diagnoses:  Vomiting, unspecified vomiting type, unspecified whether nausea present     Rx / DC Orders   ED Discharge Orders     None        Note:  This document was prepared using Dragon voice recognition software and may include unintentional dictation errors.   Sharyn Creamer, MD 06/30/23 2029

## 2023-06-30 NOTE — Discharge Instructions (Addendum)
? ?  Please return to the emergency room right away if you are to develop a fever, severe nausea, your pain becomes severe or worsens, you are unable to keep food down, begin vomiting any dark or bloody fluid, you develop any dark or bloody stools, feel dehydrated, or other new concerns or symptoms arise. ? ?

## 2023-06-30 NOTE — ED Triage Notes (Signed)
Patient to ED via POV for vomiting. Ongoing since Saturday- unable to keep anything down. Denies pain. Hx dementia

## 2023-07-07 DIAGNOSIS — I480 Paroxysmal atrial fibrillation: Secondary | ICD-10-CM | POA: Diagnosis not present

## 2023-07-07 DIAGNOSIS — R1312 Dysphagia, oropharyngeal phase: Secondary | ICD-10-CM | POA: Diagnosis not present

## 2023-07-07 DIAGNOSIS — G40909 Epilepsy, unspecified, not intractable, without status epilepticus: Secondary | ICD-10-CM | POA: Diagnosis not present

## 2023-07-07 DIAGNOSIS — E43 Unspecified severe protein-calorie malnutrition: Secondary | ICD-10-CM | POA: Diagnosis not present

## 2023-07-07 DIAGNOSIS — F0280 Dementia in other diseases classified elsewhere without behavioral disturbance: Secondary | ICD-10-CM | POA: Diagnosis not present

## 2023-07-07 DIAGNOSIS — G309 Alzheimer's disease, unspecified: Secondary | ICD-10-CM | POA: Diagnosis not present

## 2023-07-07 DIAGNOSIS — I69354 Hemiplegia and hemiparesis following cerebral infarction affecting left non-dominant side: Secondary | ICD-10-CM | POA: Diagnosis not present

## 2023-07-07 DIAGNOSIS — I69391 Dysphagia following cerebral infarction: Secondary | ICD-10-CM | POA: Diagnosis not present

## 2023-07-07 DIAGNOSIS — F0150 Vascular dementia without behavioral disturbance: Secondary | ICD-10-CM | POA: Diagnosis not present

## 2023-07-08 ENCOUNTER — Telehealth: Payer: Self-pay

## 2023-07-08 DIAGNOSIS — G309 Alzheimer's disease, unspecified: Secondary | ICD-10-CM | POA: Diagnosis not present

## 2023-07-08 DIAGNOSIS — Z8673 Personal history of transient ischemic attack (TIA), and cerebral infarction without residual deficits: Secondary | ICD-10-CM | POA: Diagnosis not present

## 2023-07-08 DIAGNOSIS — I69391 Dysphagia following cerebral infarction: Secondary | ICD-10-CM | POA: Diagnosis not present

## 2023-07-08 DIAGNOSIS — F0280 Dementia in other diseases classified elsewhere without behavioral disturbance: Secondary | ICD-10-CM | POA: Diagnosis not present

## 2023-07-08 DIAGNOSIS — R1312 Dysphagia, oropharyngeal phase: Secondary | ICD-10-CM | POA: Diagnosis not present

## 2023-07-08 DIAGNOSIS — K449 Diaphragmatic hernia without obstruction or gangrene: Secondary | ICD-10-CM | POA: Diagnosis not present

## 2023-07-08 DIAGNOSIS — E43 Unspecified severe protein-calorie malnutrition: Secondary | ICD-10-CM | POA: Diagnosis not present

## 2023-07-08 DIAGNOSIS — I480 Paroxysmal atrial fibrillation: Secondary | ICD-10-CM | POA: Diagnosis not present

## 2023-07-08 DIAGNOSIS — G40909 Epilepsy, unspecified, not intractable, without status epilepticus: Secondary | ICD-10-CM | POA: Diagnosis not present

## 2023-07-08 DIAGNOSIS — F0150 Vascular dementia without behavioral disturbance: Secondary | ICD-10-CM | POA: Diagnosis not present

## 2023-07-08 DIAGNOSIS — I69354 Hemiplegia and hemiparesis following cerebral infarction affecting left non-dominant side: Secondary | ICD-10-CM | POA: Diagnosis not present

## 2023-07-08 DIAGNOSIS — R112 Nausea with vomiting, unspecified: Secondary | ICD-10-CM | POA: Diagnosis not present

## 2023-07-08 NOTE — Telephone Encounter (Signed)
Transition Care Management Follow-up Telephone Call Date of discharge and from where: 06/30/2023 [facility] How have you been since you were released from the hospital? Patient is still experiencing frequent vomiting and is not able to eat much per caretaker. Caretaker is feeding bland diet. Any questions or concerns? No  Items Reviewed: Did the pt receive and understand the discharge instructions provided? Yes  Medications obtained and verified?  No medication prescribed. Other? No  Any new allergies since your discharge? No  Dietary orders reviewed? Yes Do you have support at home? Yes   Follow up appointments reviewed:  PCP Hospital f/u appt confirmed? Yes  Scheduled to see Marisue Ivan, MD on 08/04/2023 @ [facility]. Specialist Hospital f/u appt confirmed? No  Scheduled to see  on  @ . Are transportation arrangements needed? No  If their condition worsens, is the pt aware to call PCP or go to the Emergency Dept.? Yes Was the patient provided with contact information for the PCP's office or ED? Yes Was to pt encouraged to call back with questions or concerns? Yes   [facility]  [facility] Population Health Community Resource Care Guide   [email or web address]  ?? [phone]   Website: [email or web address]  [email or web address]

## 2023-07-11 ENCOUNTER — Other Ambulatory Visit: Payer: Self-pay

## 2023-07-11 ENCOUNTER — Encounter (HOSPITAL_COMMUNITY): Payer: Self-pay

## 2023-07-11 ENCOUNTER — Emergency Department (HOSPITAL_COMMUNITY): Payer: Medicare HMO

## 2023-07-11 ENCOUNTER — Inpatient Hospital Stay (HOSPITAL_COMMUNITY)
Admission: EM | Admit: 2023-07-11 | Discharge: 2023-07-18 | DRG: 100 | Disposition: A | Discharge status: Skilled Nursing Facility | Payer: Medicare HMO | Attending: Internal Medicine | Admitting: Internal Medicine

## 2023-07-11 DIAGNOSIS — F0390 Unspecified dementia without behavioral disturbance: Secondary | ICD-10-CM | POA: Diagnosis not present

## 2023-07-11 DIAGNOSIS — F02811 Dementia in other diseases classified elsewhere, unspecified severity, with agitation: Secondary | ICD-10-CM | POA: Diagnosis present

## 2023-07-11 DIAGNOSIS — G9389 Other specified disorders of brain: Secondary | ICD-10-CM | POA: Diagnosis not present

## 2023-07-11 DIAGNOSIS — I959 Hypotension, unspecified: Secondary | ICD-10-CM | POA: Diagnosis present

## 2023-07-11 DIAGNOSIS — I679 Cerebrovascular disease, unspecified: Secondary | ICD-10-CM | POA: Diagnosis not present

## 2023-07-11 DIAGNOSIS — R55 Syncope and collapse: Secondary | ICD-10-CM | POA: Diagnosis not present

## 2023-07-11 DIAGNOSIS — W19XXXA Unspecified fall, initial encounter: Secondary | ICD-10-CM | POA: Diagnosis present

## 2023-07-11 DIAGNOSIS — R569 Unspecified convulsions: Secondary | ICD-10-CM | POA: Diagnosis not present

## 2023-07-11 DIAGNOSIS — G9340 Encephalopathy, unspecified: Secondary | ICD-10-CM | POA: Diagnosis not present

## 2023-07-11 DIAGNOSIS — Z823 Family history of stroke: Secondary | ICD-10-CM

## 2023-07-11 DIAGNOSIS — I63512 Cerebral infarction due to unspecified occlusion or stenosis of left middle cerebral artery: Secondary | ICD-10-CM | POA: Diagnosis not present

## 2023-07-11 DIAGNOSIS — I6389 Other cerebral infarction: Secondary | ICD-10-CM | POA: Diagnosis not present

## 2023-07-11 DIAGNOSIS — Z66 Do not resuscitate: Secondary | ICD-10-CM | POA: Diagnosis not present

## 2023-07-11 DIAGNOSIS — E876 Hypokalemia: Secondary | ICD-10-CM | POA: Diagnosis not present

## 2023-07-11 DIAGNOSIS — X30XXXA Exposure to excessive natural heat, initial encounter: Secondary | ICD-10-CM

## 2023-07-11 DIAGNOSIS — F0150 Vascular dementia without behavioral disturbance: Secondary | ICD-10-CM | POA: Diagnosis not present

## 2023-07-11 DIAGNOSIS — Z743 Need for continuous supervision: Secondary | ICD-10-CM | POA: Diagnosis not present

## 2023-07-11 DIAGNOSIS — Z1152 Encounter for screening for COVID-19: Secondary | ICD-10-CM | POA: Diagnosis not present

## 2023-07-11 DIAGNOSIS — B9629 Other Escherichia coli [E. coli] as the cause of diseases classified elsewhere: Secondary | ICD-10-CM | POA: Diagnosis not present

## 2023-07-11 DIAGNOSIS — N179 Acute kidney failure, unspecified: Secondary | ICD-10-CM | POA: Diagnosis present

## 2023-07-11 DIAGNOSIS — T675XXA Heat exhaustion, unspecified, initial encounter: Secondary | ICD-10-CM | POA: Diagnosis present

## 2023-07-11 DIAGNOSIS — J449 Chronic obstructive pulmonary disease, unspecified: Secondary | ICD-10-CM | POA: Diagnosis present

## 2023-07-11 DIAGNOSIS — E785 Hyperlipidemia, unspecified: Secondary | ICD-10-CM | POA: Diagnosis present

## 2023-07-11 DIAGNOSIS — Z79899 Other long term (current) drug therapy: Secondary | ICD-10-CM

## 2023-07-11 DIAGNOSIS — N400 Enlarged prostate without lower urinary tract symptoms: Secondary | ICD-10-CM | POA: Diagnosis present

## 2023-07-11 DIAGNOSIS — R1310 Dysphagia, unspecified: Secondary | ICD-10-CM | POA: Diagnosis present

## 2023-07-11 DIAGNOSIS — R5081 Fever presenting with conditions classified elsewhere: Secondary | ICD-10-CM | POA: Diagnosis present

## 2023-07-11 DIAGNOSIS — I69351 Hemiplegia and hemiparesis following cerebral infarction affecting right dominant side: Secondary | ICD-10-CM | POA: Diagnosis not present

## 2023-07-11 DIAGNOSIS — I4819 Other persistent atrial fibrillation: Secondary | ICD-10-CM | POA: Diagnosis present

## 2023-07-11 DIAGNOSIS — Z8673 Personal history of transient ischemic attack (TIA), and cerebral infarction without residual deficits: Secondary | ICD-10-CM

## 2023-07-11 DIAGNOSIS — A419 Sepsis, unspecified organism: Secondary | ICD-10-CM | POA: Diagnosis not present

## 2023-07-11 DIAGNOSIS — Z91199 Patient's noncompliance with other medical treatment and regimen due to unspecified reason: Secondary | ICD-10-CM

## 2023-07-11 DIAGNOSIS — I10 Essential (primary) hypertension: Secondary | ICD-10-CM | POA: Diagnosis not present

## 2023-07-11 DIAGNOSIS — K219 Gastro-esophageal reflux disease without esophagitis: Secondary | ICD-10-CM | POA: Diagnosis present

## 2023-07-11 DIAGNOSIS — R0902 Hypoxemia: Secondary | ICD-10-CM | POA: Diagnosis not present

## 2023-07-11 DIAGNOSIS — N390 Urinary tract infection, site not specified: Secondary | ICD-10-CM | POA: Diagnosis not present

## 2023-07-11 DIAGNOSIS — G9341 Metabolic encephalopathy: Secondary | ICD-10-CM | POA: Diagnosis present

## 2023-07-11 DIAGNOSIS — Y901 Blood alcohol level of 20-39 mg/100 ml: Secondary | ICD-10-CM | POA: Diagnosis present

## 2023-07-11 DIAGNOSIS — R000 Tachycardia, unspecified: Secondary | ICD-10-CM | POA: Diagnosis not present

## 2023-07-11 DIAGNOSIS — Z91148 Patient's other noncompliance with medication regimen for other reason: Secondary | ICD-10-CM

## 2023-07-11 DIAGNOSIS — G40909 Epilepsy, unspecified, not intractable, without status epilepticus: Principal | ICD-10-CM | POA: Diagnosis present

## 2023-07-11 DIAGNOSIS — Z1612 Extended spectrum beta lactamase (ESBL) resistance: Secondary | ICD-10-CM | POA: Diagnosis not present

## 2023-07-11 DIAGNOSIS — F1010 Alcohol abuse, uncomplicated: Secondary | ICD-10-CM | POA: Diagnosis not present

## 2023-07-11 DIAGNOSIS — I6522 Occlusion and stenosis of left carotid artery: Secondary | ICD-10-CM | POA: Diagnosis present

## 2023-07-11 DIAGNOSIS — R262 Difficulty in walking, not elsewhere classified: Secondary | ICD-10-CM | POA: Diagnosis present

## 2023-07-11 DIAGNOSIS — K449 Diaphragmatic hernia without obstruction or gangrene: Secondary | ICD-10-CM | POA: Diagnosis not present

## 2023-07-11 DIAGNOSIS — R0603 Acute respiratory distress: Secondary | ICD-10-CM | POA: Diagnosis not present

## 2023-07-11 DIAGNOSIS — I6523 Occlusion and stenosis of bilateral carotid arteries: Secondary | ICD-10-CM | POA: Diagnosis not present

## 2023-07-11 DIAGNOSIS — G309 Alzheimer's disease, unspecified: Secondary | ICD-10-CM | POA: Diagnosis present

## 2023-07-11 DIAGNOSIS — R0682 Tachypnea, not elsewhere classified: Secondary | ICD-10-CM | POA: Diagnosis present

## 2023-07-11 DIAGNOSIS — B9620 Unspecified Escherichia coli [E. coli] as the cause of diseases classified elsewhere: Secondary | ICD-10-CM | POA: Diagnosis present

## 2023-07-11 DIAGNOSIS — I129 Hypertensive chronic kidney disease with stage 1 through stage 4 chronic kidney disease, or unspecified chronic kidney disease: Secondary | ICD-10-CM | POA: Diagnosis present

## 2023-07-11 DIAGNOSIS — Z7901 Long term (current) use of anticoagulants: Secondary | ICD-10-CM

## 2023-07-11 DIAGNOSIS — N1831 Chronic kidney disease, stage 3a: Secondary | ICD-10-CM | POA: Diagnosis present

## 2023-07-11 DIAGNOSIS — Z87891 Personal history of nicotine dependence: Secondary | ICD-10-CM

## 2023-07-11 DIAGNOSIS — R9089 Other abnormal findings on diagnostic imaging of central nervous system: Secondary | ICD-10-CM | POA: Diagnosis not present

## 2023-07-11 DIAGNOSIS — R29708 NIHSS score 8: Secondary | ICD-10-CM | POA: Diagnosis present

## 2023-07-11 DIAGNOSIS — T6701XA Heatstroke and sunstroke, initial encounter: Secondary | ICD-10-CM | POA: Diagnosis not present

## 2023-07-11 DIAGNOSIS — G319 Degenerative disease of nervous system, unspecified: Secondary | ICD-10-CM | POA: Diagnosis not present

## 2023-07-11 DIAGNOSIS — R509 Fever, unspecified: Secondary | ICD-10-CM | POA: Diagnosis not present

## 2023-07-11 DIAGNOSIS — R0689 Other abnormalities of breathing: Secondary | ICD-10-CM | POA: Diagnosis not present

## 2023-07-11 DIAGNOSIS — R531 Weakness: Secondary | ICD-10-CM | POA: Diagnosis not present

## 2023-07-11 LAB — DIFFERENTIAL
Abs Immature Granulocytes: 0.16 10*3/uL — ABNORMAL HIGH (ref 0.00–0.07)
Basophils Absolute: 0 10*3/uL (ref 0.0–0.1)
Basophils Relative: 0 %
Eosinophils Absolute: 0 10*3/uL (ref 0.0–0.5)
Eosinophils Relative: 0 %
Immature Granulocytes: 1 %
Lymphocytes Relative: 5 %
Lymphs Abs: 0.6 10*3/uL — ABNORMAL LOW (ref 0.7–4.0)
Monocytes Absolute: 1 10*3/uL (ref 0.1–1.0)
Monocytes Relative: 9 %
Neutro Abs: 9.4 10*3/uL — ABNORMAL HIGH (ref 1.7–7.7)
Neutrophils Relative %: 85 %

## 2023-07-11 LAB — CBC
HCT: 29.3 % — ABNORMAL LOW (ref 39.0–52.0)
Hemoglobin: 9.8 g/dL — ABNORMAL LOW (ref 13.0–17.0)
MCH: 30.2 pg (ref 26.0–34.0)
MCHC: 33.4 g/dL (ref 30.0–36.0)
MCV: 90.4 fL (ref 80.0–100.0)
Platelets: 223 10*3/uL (ref 150–400)
RBC: 3.24 MIL/uL — ABNORMAL LOW (ref 4.22–5.81)
RDW: 15.8 % — ABNORMAL HIGH (ref 11.5–15.5)
WBC: 11.2 10*3/uL — ABNORMAL HIGH (ref 4.0–10.5)
nRBC: 0 % (ref 0.0–0.2)

## 2023-07-11 LAB — I-STAT CHEM 8, ED
BUN: 21 mg/dL (ref 8–23)
Calcium, Ion: 1.21 mmol/L (ref 1.15–1.40)
Chloride: 109 mmol/L (ref 98–111)
Creatinine, Ser: 2.1 mg/dL — ABNORMAL HIGH (ref 0.61–1.24)
Glucose, Bld: 150 mg/dL — ABNORMAL HIGH (ref 70–99)
HCT: 29 % — ABNORMAL LOW (ref 39.0–52.0)
Hemoglobin: 9.9 g/dL — ABNORMAL LOW (ref 13.0–17.0)
Potassium: 3.2 mmol/L — ABNORMAL LOW (ref 3.5–5.1)
Sodium: 138 mmol/L (ref 135–145)
TCO2: 15 mmol/L — ABNORMAL LOW (ref 22–32)

## 2023-07-11 LAB — COMPREHENSIVE METABOLIC PANEL
ALT: 13 U/L (ref 0–44)
AST: 16 U/L (ref 15–41)
Albumin: 2.8 g/dL — ABNORMAL LOW (ref 3.5–5.0)
Alkaline Phosphatase: 71 U/L (ref 38–126)
Anion gap: 11 (ref 5–15)
BUN: 22 mg/dL (ref 8–23)
CO2: 15 mmol/L — ABNORMAL LOW (ref 22–32)
Calcium: 8.7 mg/dL — ABNORMAL LOW (ref 8.9–10.3)
Chloride: 108 mmol/L (ref 98–111)
Creatinine, Ser: 2.01 mg/dL — ABNORMAL HIGH (ref 0.61–1.24)
GFR, Estimated: 32 mL/min — ABNORMAL LOW (ref >60)
Glucose, Bld: 138 mg/dL — ABNORMAL HIGH (ref 70–99)
Potassium: 3 mmol/L — ABNORMAL LOW (ref 3.5–5.1)
Sodium: 134 mmol/L — ABNORMAL LOW (ref 135–145)
Total Bilirubin: 0.6 mg/dL (ref 0.3–1.2)
Total Protein: 6.3 g/dL — ABNORMAL LOW (ref 6.5–8.1)

## 2023-07-11 LAB — RESP PANEL BY RT-PCR (RSV, FLU A&B, COVID)  RVPGX2
Influenza A by PCR: NEGATIVE
Influenza B by PCR: NEGATIVE
Resp Syncytial Virus by PCR: NEGATIVE
SARS Coronavirus 2 by RT PCR: NEGATIVE

## 2023-07-11 LAB — PROTIME-INR
INR: 2.4 — ABNORMAL HIGH (ref 0.8–1.2)
Prothrombin Time: 26.6 seconds — ABNORMAL HIGH (ref 11.4–15.2)

## 2023-07-11 LAB — CK: Total CK: 156 U/L (ref 49–397)

## 2023-07-11 LAB — APTT: aPTT: 45 seconds — ABNORMAL HIGH (ref 24–36)

## 2023-07-11 LAB — VITAMIN B12: Vitamin B-12: 671 pg/mL (ref 180–914)

## 2023-07-11 LAB — CBG MONITORING, ED: Glucose-Capillary: 130 mg/dL — ABNORMAL HIGH (ref 70–99)

## 2023-07-11 LAB — TSH: TSH: 1.844 u[IU]/mL (ref 0.350–4.500)

## 2023-07-11 LAB — AMMONIA: Ammonia: 20 umol/L (ref 9–35)

## 2023-07-11 LAB — ETHANOL: Alcohol, Ethyl (B): <10 mg/dL (ref <10)

## 2023-07-11 MED ORDER — POTASSIUM CHLORIDE 20 MEQ PO PACK
40.0000 meq | PACK | Freq: Two times a day (BID) | ORAL | Status: DC
  Administered 2023-07-12: 40 meq via ORAL
  Filled 2023-07-11: qty 2

## 2023-07-11 MED ORDER — LEVETIRACETAM IN NACL 500 MG/100ML IV SOLN
500.0000 mg | Freq: Two times a day (BID) | INTRAVENOUS | Status: DC
  Administered 2023-07-12 – 2023-07-13 (×3): 500 mg via INTRAVENOUS
  Filled 2023-07-11 (×3): qty 100

## 2023-07-11 MED ORDER — SODIUM CHLORIDE 0.9 % IV SOLN
2000.0000 mg | Freq: Once | INTRAVENOUS | Status: AC
  Administered 2023-07-11: 2000 mg via INTRAVENOUS
  Filled 2023-07-11: qty 20

## 2023-07-11 MED ORDER — SODIUM CHLORIDE 0.9 % IV SOLN: INTRAVENOUS | Status: DC

## 2023-07-11 MED ORDER — SODIUM CHLORIDE 0.9% FLUSH: 3.0000 mL | Freq: Once | INTRAVENOUS | Status: DC

## 2023-07-11 MED ORDER — SODIUM CHLORIDE 0.9 % IV BOLUS
500.0000 mL | Freq: Once | INTRAVENOUS | Status: AC
  Administered 2023-07-11: 500 mL via INTRAVENOUS

## 2023-07-11 MED ORDER — VANCOMYCIN HCL 1250 MG/250ML IV SOLN: 1250.0000 mg | INTRAVENOUS | Status: DC

## 2023-07-11 MED ORDER — LEVETIRACETAM IN NACL 500 MG/100ML IV SOLN: 500.0000 mg | Freq: Two times a day (BID) | INTRAVENOUS | Status: DC

## 2023-07-11 MED ORDER — VANCOMYCIN HCL 1500 MG/300ML IV SOLN
1500.0000 mg | Freq: Once | INTRAVENOUS | Status: AC
  Administered 2023-07-12: 1500 mg via INTRAVENOUS
  Filled 2023-07-11: qty 300

## 2023-07-11 MED ORDER — SODIUM CHLORIDE 0.9 % IV SOLN
2.0000 g | INTRAVENOUS | Status: DC
  Administered 2023-07-11: 2 g via INTRAVENOUS
  Filled 2023-07-11: qty 12.5

## 2023-07-11 MED ORDER — IOHEXOL 350 MG/ML SOLN
75.0000 mL | Freq: Once | INTRAVENOUS | Status: AC | PRN
  Administered 2023-07-11: 75 mL via INTRAVENOUS

## 2023-07-11 MED ORDER — LACTATED RINGERS IV BOLUS
1000.0000 mL | Freq: Once | INTRAVENOUS | Status: AC
  Administered 2023-07-11: 1000 mL via INTRAVENOUS

## 2023-07-11 MED ORDER — ACETAMINOPHEN 500 MG PO TABS: 1000.0000 mg | ORAL_TABLET | Freq: Once | ORAL | Status: DC

## 2023-07-11 NOTE — ED Provider Notes (Signed)
[place] EMERGENCY DEPARTMENT AT [facility] Provider Note  MDM   HPI/ROS:  Richard Carter is a 82 y.o. male with past medical history of ischemic strokes, A-fib on Eliquis presenting via EMS as a code stroke.  Patient was reportedly found altered and down on his front porch lying in the sun.  Last known normal was approximately 12 PM, and neighbors found him altered at approximately 1500.  Upon arrival to the emergency department, patient ANO x 1, with nonrebreather in place at 10 L.  Vital signs stable.  Physical exam is notable for: - Chronically ill-appearing but no acute distress - ANO x 2, able to state his name and that he is in the hospital, but unable to provide further history - Neurological exam concerning for mild right-sided weakness as compared to the left.  Extremely garbled speech - Erythematous skin along entirety of right side of patient's body, consistent with history of patient being found lying on the porch in the sun - No obvious neurologic deficits on exam, but exam limited by altered mental status  Immediately upon presentation to the emergency department, airway assessment performed.  Patient able to follow commands and state his name.  De-escalated from nonrebreather to nasal cannula at 5 L.  Patient was then taken emergently to the CT scanner for code stroke imaging.  No obvious abnormalities noted on imaging.  Differential diagnosis includes but is not limited to CVA, seizure, heat exhaustion/heat stroke, sepsis, rhabdo, toxic metabolic disturbance, severe dehydration, cardiac pathology, etc.  Broad infectious metabolic workup initiated.  Rectal temp noted to be 102.  Patient was brought ice packs for rapid cooling.  Administered Tylenol and IV fluids.  I-STAT Chem-8 notable for creatinine of 2.0 from 1.3 baseline, potassium 3.2.  P.o. repletion administered.  CBC with mild leukocytosis of 11.2.  CK within normal limits.  Chest x-ray with no acute  cardiopulmonary abnormalities.  Upon reassessment, patient remains resting comfortably with no new symptoms.  Mental status unchanged from prior exam.  Now satting 98% on 2 L nasal cannula.  Repeat temperature 99.5 after application of ice packs.  No Tylenol administered.  Antibiotic administration deferred at this time given hyperthermia likely explained by environmental exposure.  Patient was posted for admission to hospitalist service for encephalopathy of unknown origin with AKI.  Decision to administer antibiotics deferred to inpatient team.  Please see inpatient provider notes for further details.  Disposition:  I discussed the case with hospitalist medicine who graciously agreed to admit the patient to their service for continued care.   Clinical Impression:  1. Encephalopathy acute     Rx / DC Orders ED Discharge Orders     None       The plan for this patient was discussed with Dr. Suezanne Jacquet, who voiced agreement and who oversaw evaluation and treatment of this patient.   Clinical Complexity A medically appropriate history, review of systems, and physical exam was performed.  My independent interpretations of EKG, labs, and radiology are documented in the ED course above.   Click here for ABCD2, HEART and other calculatorsREFRESH Note before signing   Patient's presentation is most consistent with acute presentation with potential threat to life or bodily function.  Medical Decision Making Amount and/or Complexity of Data Reviewed Labs: ordered.  Risk OTC drugs. Prescription drug management. Decision regarding hospitalization.    HPI/ROS      See MDM section for pertinent HPI and ROS. A complete ROS was performed with pertinent positives/negatives  noted above.   Past Medical History:  Diagnosis Date   Alcohol abuse    Dementia (HCC)    GERD (gastroesophageal reflux disease)    HLD (hyperlipidemia)    HTN (hypertension)    Seizures (HCC)    Stroke [facility])      Past Surgical History:  Procedure Laterality Date   ENDARTERECTOMY Left 04/02/2023   Procedure: ENDARTERECTOMY CAROTID;  Surgeon: Renford Dills, MD;  Location: [facility] ORS;  Service: Vascular;  Laterality: Left;   skin cyst removal        Physical Exam   Vitals:   07/11/23 1830 07/11/23 1845 07/11/23 1900 07/11/23 1915  BP: (!) 114/59 (!) 113/52 (!) 96/59 (!) 97/56  Pulse: 60 61 (!) 58 (!) 58  Resp: 16 18 17 17   Temp:      TempSrc:      SpO2: 100% 98% 93% 93%  Weight:      Height:        Physical Exam Vitals and nursing note reviewed.  Constitutional:      General: He is not in acute distress.    Appearance: He is well-developed.  HENT:     Head: Normocephalic and atraumatic.  Eyes:     Conjunctiva/sclera: Conjunctivae normal.  Cardiovascular:     Rate and Rhythm: Normal rate and regular rhythm.     Heart sounds: No murmur heard. Pulmonary:     Effort: Pulmonary effort is normal. No respiratory distress.     Breath sounds: Normal breath sounds.  Abdominal:     Palpations: Abdomen is soft.     Tenderness: There is no abdominal tenderness.  Musculoskeletal:        General: No swelling.     Cervical back: Neck supple.  Skin:    General: Skin is warm and dry.  Neurological:     Mental Status: He is alert. He is disoriented.     Sensory: No sensory deficit.     Comments: Exam notable for mild right-sided weakness of upper and lower extremities.  No obvious facial droop.  Markedly garbled speech  Psychiatric:        Mood and Affect: Mood normal.    Starleen Arms, MD Department of Emergency Medicine   Please note that this documentation was produced with the assistance of voice-to-text technology and may contain errors.    Dyanne Iha, MD 07/11/23 Corky Crafts    Lonell Grandchild, MD 07/11/23 [phone]

## 2023-07-11 NOTE — Progress Notes (Signed)
EEG complete - results pending 

## 2023-07-11 NOTE — H&P (Signed)
History and Physical   [facility] [place] @ [facility] Admission History and Physical [employer], D.O.    Patient Name: Richard Carter MR#: [id number] Date of Birth: 1941-07-24 Date of Admission: 07/11/2023  Referring MD/NP/PA: Dr. Mcneil Sober Primary Care Physician: Marisue Ivan, MD  Chief Complaint: Please note the entire history is obtained from the patient's emergency department chart, emergency department provider and the patient's family who is at the bedside. Patient's personal history is limited by dementia and altered mental status.   HPI: Richard Carter is a 82 y.o. male with a known history of remote alcohol abuse, dementia, GERD, hypertension, hyperlipidemia, seizures, ischemic CVA, atrial fibrillation on Eliquis presents to the emergency department for evaluation of code stroke.    Patient was in a usual state of health last known well at noon  this afternoon he was then found by his neighbor around 3 PM laying in the sun reportedly altered/unconscious.  NIH stroke score of 12.  Temperature of 102.  He was seen by neurology who recommended admission for observation and further stroke workup  Of note patient has had multiple admissions for stroke in 2024 including March, May and July.  EMS/ED Course: Patient received Tylenol, Keppra, lactated Ringer's, potassium. Medical admission has been requested for further management of altered mental status, hyperthermia.  Review of Systems:  Unable to obtain secondary to altered mental status  Past Medical History:  Diagnosis Date   Alcohol abuse    Dementia (HCC)    GERD (gastroesophageal reflux disease)    HLD (hyperlipidemia)    HTN (hypertension)    Seizures (HCC)    Stroke [facility])     Past Surgical History:  Procedure Laterality Date   ENDARTERECTOMY Left 04/02/2023   Procedure: ENDARTERECTOMY CAROTID;  Surgeon: Renford Dills, MD;  Location: [facility] ORS;  Service: Vascular;  Laterality: Left;   skin cyst  removal       reports that he quit smoking about 19 months ago. His smoking use included cigarettes. He started smoking about 69 years ago. He has a 34 pack-year smoking history. He has never used smokeless tobacco. He reports that he does not currently use alcohol. He reports that he does not use drugs.  No Known Allergies  Family History  Problem Relation Age of Onset   Stroke Mother    Cancer Brother     Prior to Admission medications   Medication Sig Start Date End Date Taking? Authorizing Provider  amLODipine (NORVASC) 10 MG tablet Take 1 tablet (10 mg total) by mouth daily. 04/07/23   Gillis Santa, MD  apixaban (ELIQUIS) 5 MG TABS tablet Take 1 tablet (5 mg total) by mouth 2 (two) times daily. 05/29/22   Lurene Shadow, MD  atorvastatin (LIPITOR) 20 MG tablet Take 20 mg by mouth every evening.    [provider]  food thickener (SIMPLYTHICK, NECTAR/LEVEL 2/MILDLY THICK,) GEL Take 1 packet by mouth as needed. 04/29/22   Swayze, Ava, DO  levETIRAcetam (KEPPRA) 500 MG tablet Take 1 tablet (500 mg total) by mouth 2 (two) times daily. Patient not taking: Reported on 05/31/2023 12/01/20   Alberteen Sam, MD  pantoprazole (PROTONIX) 40 MG tablet Take 1 tablet (40 mg total) by mouth daily. 06/11/23 08/10/23  Lurene Shadow, MD  polyethylene glycol (MIRALAX / GLYCOLAX) 17 g packet Take 17 g by mouth daily as needed. 06/11/23   Lurene Shadow, MD  senna-docusate (SENOKOT-S) 8.6-50 MG tablet Take 1 tablet by mouth at bedtime as needed for mild  constipation or moderate constipation. 06/11/23   Lurene Shadow, MD  tamsulosin (FLOMAX) 0.4 MG CAPS capsule Take 1 capsule (0.4 mg total) by mouth daily after breakfast. 06/11/23   Lurene Shadow, MD  traZODone (DESYREL) 100 MG tablet Take 1 tablet (100 mg total) by mouth at bedtime. 05/11/21   Tresa Moore, MD    Physical Exam: Vitals:   07/11/23 1830 07/11/23 1845 07/11/23 1900 07/11/23 1915  BP: (!) 114/59 (!) 113/52 (!) 96/59 (!) 97/56   Pulse: 60 61 (!) 58 (!) 58  Resp: 16 18 17 17   Temp:      TempSrc:      SpO2: 100% 98% 93% 93%  Weight:      Height:        GENERAL: 82 y.o.-year-old white male patient, chronically ill-appearing lying in the bed in no acute distress.   HEENT: Head atraumatic, normocephalic. Pupils equal. Mucus membranes dry. NECK: Supple. No JVD. CHEST: Normal breath sounds bilaterally. No wheezing, rales, rhonchi or crackles. No use of accessory muscles of respiration.  No reproducible chest wall tenderness.  CARDIOVASCULAR: S1, S2 normal. No murmurs, rubs, or gallops. Cap refill <2 seconds. Pulses intact distally.  ABDOMEN: Soft, nondistended, nontender. No rebound, guarding, rigidity. Normoactive bowel sounds present in all four quadrants.  EXTREMITIES: No pedal edema, cyanosis, or clubbing. No calf tenderness or Homan's sign.  NEUROLOGIC: The patient is lethargic but arousable.  Does not comply with exam.  He does attempt to respond verbally but drifts off to sleep   Labs on Admission:  CBC: Recent Labs  Lab 07/11/23 1540 07/11/23 1725  WBC  --  11.2*  NEUTROABS  --  9.4*  HGB 9.9* 9.8*  HCT 29.0* 29.3*  MCV  --  90.4  PLT  --  223   Basic Metabolic Panel: Recent Labs  Lab 07/11/23 1540 07/11/23 1725  NA 138 134*  K 3.2* 3.0*  CL 109 108  CO2  --  15*  GLUCOSE 150* 138*  BUN 21 22  CREATININE 2.10* 2.01*  CALCIUM  --  8.7*   GFR: Estimated Creatinine Clearance: 25.7 mL/min (A) (by C-G formula based on SCr of 2.01 mg/dL (H)). Liver Function Tests: Recent Labs  Lab 07/11/23 1725  AST 16  ALT 13  ALKPHOS 71  BILITOT 0.6  PROT 6.3*  ALBUMIN 2.8*   No results for input(s): "LIPASE", "AMYLASE" in the last 168 hours. Recent Labs  Lab 07/11/23 1704  AMMONIA 20   Coagulation Profile: Recent Labs  Lab 07/11/23 1725  INR 2.4*   Cardiac Enzymes: Recent Labs  Lab 07/11/23 1725  CKTOTAL 156   BNP (last 3 results) No results for input(s): "PROBNP" in the last 8760  hours. HbA1C: No results for input(s): "HGBA1C" in the last 72 hours. CBG: Recent Labs  Lab 07/11/23 1535  GLUCAP 130*   Lipid Profile: No results for input(s): "CHOL", "HDL", "LDLCALC", "TRIG", "CHOLHDL", "LDLDIRECT" in the last 72 hours. Thyroid Function Tests: Recent Labs    07/11/23 1726  TSH 1.844   Anemia Panel: Recent Labs    07/11/23 1726  VITAMINB12 671   Urine analysis:    Component Value Date/Time   COLORURINE YELLOW (A) 06/30/2023 1218   APPEARANCEUR CLOUDY (A) 06/30/2023 1218   APPEARANCEUR Clear 06/12/2014 1029   LABSPEC 1.010 06/30/2023 1218   LABSPEC 1.005 06/12/2014 1029   PHURINE 6.0 06/30/2023 1218   GLUCOSEU NEGATIVE 06/30/2023 1218   GLUCOSEU Negative 06/12/2014 1029   HGBUR LARGE (A) 06/30/2023  1218   BILIRUBINUR NEGATIVE 06/30/2023 1218   BILIRUBINUR Negative 06/12/2014 1029   KETONESUR NEGATIVE 06/30/2023 1218   PROTEINUR 30 (A) 06/30/2023 1218   NITRITE NEGATIVE 06/30/2023 1218   LEUKOCYTESUR SMALL (A) 06/30/2023 1218   LEUKOCYTESUR Negative 06/12/2014 1029   Sepsis Labs: @LABRCNTIP (procalcitonin:4,lacticidven:4) )No results found for this or any previous visit (from the past 240 hour(s)).   Radiological Exams on Admission: DG CHEST PORT 1 VIEW  Result Date: 07/11/2023 CLINICAL DATA:  Respiratory distress chest EXAM: PORTABLE CHEST 1 VIEW COMPARISON:  Chest x-ray dated June 30, 2023 FINDINGS: Unchanged cardiac and mediastinal contours. Widening of the right paratracheal region, unchanged when compared with the prior pleural priors and likely due to goiter or vascular tortuosity. Moderate hiatal hernia. No focal consolidation. No evidence of pleural effusion or pneumothorax. Old left-sided rib fractures. IMPRESSION: 1. No acute cardiopulmonary process. 2. Moderate hiatal hernia. Electronically Signed   By: Allegra Lai M.D.   On: 07/11/2023 16:39   CT HEAD CODE STROKE WO CONTRAST  Result Date: 07/11/2023 CLINICAL DATA:  Code stroke.  Left gaze, right-sided weakness. Responsive. EXAM: CT ANGIOGRAPHY HEAD AND NECK TECHNIQUE: Multidetector CT imaging of the head and neck was performed using the standard protocol during bolus administration of intravenous contrast. Multiplanar CT image reconstructions and MIPs were obtained to evaluate the vascular anatomy. Carotid stenosis measurements (when applicable) are obtained utilizing NASCET criteria, using the distal internal carotid diameter as the denominator. RADIATION DOSE REDUCTION: This exam was performed according to the departmental dose-optimization program which includes automated exposure control, adjustment of the mA and/or kV according to patient size and/or use of iterative reconstruction technique. CONTRAST:  75mL OMNIPAQUE IOHEXOL 350 MG/ML SOLN COMPARISON:  CT head 06/30/2023, CTA head and neck 02/18/2023 FINDINGS: CT HEAD FINDINGS Brain: There is no acute intracranial hemorrhage, extra-axial fluid collection, or acute infarct There is unchanged parenchymal volume loss with prominence of the ventricular system and extra-axial CSF spaces. The ventricles are stable in size. The remote cortical infarcts in the left frontal lobe posteriorly and right parietal lobe and small remote infarcts in the left corona radiata are unchanged. The pituitary and suprasellar region are normal. There is no mass lesion. There is no mass effect or midline shift. Vascular: See below. Skull: Normal. Negative for fracture or focal lesion. Sinuses/Orbits: There is mild mucosal thickening in the paranasal sinuses. The globes and orbits are unremarkable. Other: The mastoid air cells and middle ear cavities are clear. ASPECTS (Alberta Stroke Program Early CT Score) - Ganglionic level infarction (caudate, lentiform nuclei, internal capsule, insula, M1-M3 cortex): 7 - Supraganglionic infarction (M4-M6 cortex): 3 Total score (0-10 with 10 being normal): 10 CTA NECK FINDINGS Aortic arch: Is mild calcified plaque in the  aortic arch. The origins of the major branch vessels are patent with calcified plaque. The subclavian arteries are patent to the level imaged. Right carotid system: The right common carotid artery is patent with scattered calcified plaque but no hemodynamically significant stenosis or occlusion. There is calcified plaque at the bifurcation and in the proximal internal carotid artery without hemodynamically significant stenosis or occlusion. There is no evidence of dissection or aneurysm. Left carotid system: The left common carotid artery is patent with scattered calcified plaque. The patient is status post left carotid endarterectomy. The internal carotid artery is widely patent. A small linear filling defect in the proximal external carotid artery is unchanged (7-199). There is no evidence of new dissection or aneurysm. Vertebral arteries: Moderate stenosis at the origin  of the right vertebral artery is unchanged. The vertebral arteries are otherwise patent, without other hemodynamically significant stenosis or occlusion. There is no evidence of dissection or aneurysm. Skeleton: There is no acute osseous abnormality or suspicious osseous lesion. There is unchanged multilevel disc and facet degeneration throughout the cervical spine with mild anterolisthesis of C3 on C4 and C7 on T1. There is no visible canal hematoma. Other neck: The soft tissues of the neck are unremarkable. Upper chest: The imaged lung apices are clear. Review of the MIP images confirms the above findings CTA HEAD FINDINGS Anterior circulation: There is unchanged calcified plaque in the intracranial ICAs resulting in mild-to-moderate stenosis bilaterally. The bilateral MCAs and ACAS are patent, without proximal stenosis or occlusion. There is no aneurysm or AVM. Posterior circulation: There is calcified plaque in the non dominant right V4 segment resulting in mild-to-moderate stenosis. The right vertebral artery is diminutive but patent after  the PICA origin. More bulky plaque in the left V4 segment resulting in mild-to-moderate stenosis is unchanged. The basilar artery is patent. The major cerebellar arteries appear patent. The bilateral PCAs are patent, without proximal stenosis or occlusion. A left posterior communicating artery is identified. There is no aneurysm or AVM. Venous sinuses: Suboptimally evaluated due to bolus timing. Anatomic variants: None. Review of the MIP images confirms the above findings IMPRESSION: 1. Stable noncontrast head CT with no acute intracranial pathology. 2. No emergent vascular finding. 3. Calcified plaque in the right carotid system without hemodynamically significant stenosis. 4. Status post left carotid endarterectomy with widely patent internal carotid artery. A small linear filling defect in the proximal external carotid artery is unchanged. 5. Unchanged calcified plaque in the intracranial ICAs and V4 segments without high-grade stenosis or occlusion, and moderate stenosis at the right vertebral artery origin. Findings of the initial noncontrast head CT communicated to Dr Roda Shutters at 3:50 pm. CTA findings communicated at 3:59 pm. Electronically Signed   By: Lesia Hausen M.D.   On: 07/11/2023 16:08   CT ANGIO HEAD NECK W WO CM (CODE STROKE)  Result Date: 07/11/2023 CLINICAL DATA:  Code stroke. Left gaze, right-sided weakness. Responsive. EXAM: CT ANGIOGRAPHY HEAD AND NECK TECHNIQUE: Multidetector CT imaging of the head and neck was performed using the standard protocol during bolus administration of intravenous contrast. Multiplanar CT image reconstructions and MIPs were obtained to evaluate the vascular anatomy. Carotid stenosis measurements (when applicable) are obtained utilizing NASCET criteria, using the distal internal carotid diameter as the denominator. RADIATION DOSE REDUCTION: This exam was performed according to the departmental dose-optimization program which includes automated exposure control,  adjustment of the mA and/or kV according to patient size and/or use of iterative reconstruction technique. CONTRAST:  75mL OMNIPAQUE IOHEXOL 350 MG/ML SOLN COMPARISON:  CT head 06/30/2023, CTA head and neck 02/18/2023 FINDINGS: CT HEAD FINDINGS Brain: There is no acute intracranial hemorrhage, extra-axial fluid collection, or acute infarct There is unchanged parenchymal volume loss with prominence of the ventricular system and extra-axial CSF spaces. The ventricles are stable in size. The remote cortical infarcts in the left frontal lobe posteriorly and right parietal lobe and small remote infarcts in the left corona radiata are unchanged. The pituitary and suprasellar region are normal. There is no mass lesion. There is no mass effect or midline shift. Vascular: See below. Skull: Normal. Negative for fracture or focal lesion. Sinuses/Orbits: There is mild mucosal thickening in the paranasal sinuses. The globes and orbits are unremarkable. Other: The mastoid air cells and middle ear cavities are  clear. ASPECTS (Alberta Stroke Program Early CT Score) - Ganglionic level infarction (caudate, lentiform nuclei, internal capsule, insula, M1-M3 cortex): 7 - Supraganglionic infarction (M4-M6 cortex): 3 Total score (0-10 with 10 being normal): 10 CTA NECK FINDINGS Aortic arch: Is mild calcified plaque in the aortic arch. The origins of the major branch vessels are patent with calcified plaque. The subclavian arteries are patent to the level imaged. Right carotid system: The right common carotid artery is patent with scattered calcified plaque but no hemodynamically significant stenosis or occlusion. There is calcified plaque at the bifurcation and in the proximal internal carotid artery without hemodynamically significant stenosis or occlusion. There is no evidence of dissection or aneurysm. Left carotid system: The left common carotid artery is patent with scattered calcified plaque. The patient is status post left carotid  endarterectomy. The internal carotid artery is widely patent. A small linear filling defect in the proximal external carotid artery is unchanged (7-199). There is no evidence of new dissection or aneurysm. Vertebral arteries: Moderate stenosis at the origin of the right vertebral artery is unchanged. The vertebral arteries are otherwise patent, without other hemodynamically significant stenosis or occlusion. There is no evidence of dissection or aneurysm. Skeleton: There is no acute osseous abnormality or suspicious osseous lesion. There is unchanged multilevel disc and facet degeneration throughout the cervical spine with mild anterolisthesis of C3 on C4 and C7 on T1. There is no visible canal hematoma. Other neck: The soft tissues of the neck are unremarkable. Upper chest: The imaged lung apices are clear. Review of the MIP images confirms the above findings CTA HEAD FINDINGS Anterior circulation: There is unchanged calcified plaque in the intracranial ICAs resulting in mild-to-moderate stenosis bilaterally. The bilateral MCAs and ACAS are patent, without proximal stenosis or occlusion. There is no aneurysm or AVM. Posterior circulation: There is calcified plaque in the non dominant right V4 segment resulting in mild-to-moderate stenosis. The right vertebral artery is diminutive but patent after the PICA origin. More bulky plaque in the left V4 segment resulting in mild-to-moderate stenosis is unchanged. The basilar artery is patent. The major cerebellar arteries appear patent. The bilateral PCAs are patent, without proximal stenosis or occlusion. A left posterior communicating artery is identified. There is no aneurysm or AVM. Venous sinuses: Suboptimally evaluated due to bolus timing. Anatomic variants: None. Review of the MIP images confirms the above findings IMPRESSION: 1. Stable noncontrast head CT with no acute intracranial pathology. 2. No emergent vascular finding. 3. Calcified plaque in the right carotid  system without hemodynamically significant stenosis. 4. Status post left carotid endarterectomy with widely patent internal carotid artery. A small linear filling defect in the proximal external carotid artery is unchanged. 5. Unchanged calcified plaque in the intracranial ICAs and V4 segments without high-grade stenosis or occlusion, and moderate stenosis at the right vertebral artery origin. Findings of the initial noncontrast head CT communicated to Dr Roda Shutters at 3:50 pm. CTA findings communicated at 3:59 pm. Electronically Signed   By: Lesia Hausen M.D.   On: 07/11/2023 16:08    EKG: Normal sinus rhythm at 87 bpm with normal axis and left anterior hemiblock nonspecific ST-T wave changes.   Assessment/Plan  This is a 82 y.o. male with a history of remote alcohol abuse, dementia, GERD, hypertension, hyperlipidemia, seizures, ischemic CVA, atrial fibrillation on Eliquis  now being admitted with:  #.  Acute encephalopathy with unclear etiology: Differential includes sepsis, CVA, heatstroke - Admit to inpatient with telemetry monitoring -Will start broad-spectrum antibiotics given fever  tachypnea hypotension and elevated white blood cell count -IV fluid hydration -N.p.o. pending speech evaluation -Tylenol and ice packs for temperature regulation  #. Rule out CVA -  - Studies: MRI, Echo -Neurochecks -Hold sedatives -Neurology and speech consulted   #. Acute kidney injury  - IV fluids and repeat BMP in AM.  - Avoid nephrotoxic medications - Bladder scan and place foley catheter if evidence of urinary retention  #. Mild hypokalemia -Replace IV  #. History of seizures -Loaded with Keppra in ER we will continue  #. History of GERD - Continue Protonix  #. History of hypertension - Continue amlodipine  #. History of hyperlipidemia - Continue atorvastatin  #.  History of atrial fibrillation -Continue Eliquis  #.  History of BPH - Continue tamsulosin  Admission status: Inpatient,  telemetry IV Fluids: LR Diet/Nutrition: N.p.o. pending swallow Consults called: Neurology, speech-language DVT Px: [name] SCDs and early ambulation. Code Status: Full Code  Disposition Plan: To be determined  All the records are reviewed and case discussed with ED provider. Management plans discussed with the patient and/or family who express understanding and agree with plan of care.  Ladonte Verstraete D.O. on 07/11/2023 at 7:36 PM CC: Primary care physician; Marisue Ivan, MD   07/11/2023, 7:36 PM

## 2023-07-11 NOTE — Consult Note (Signed)
Stroke Neurology Consultation Note  Consult Requested by: Dr Suezanne Jacquet  Reason for Consult: code stroke  Consult Date: 07/11/23   The history was obtained from the EMS.  During history and examination, all items were able to obtain unless otherwise noted.  History of Present Illness:  Richard Carter is a 82 y.o. Caucasian male with PMH of recurrent strokes, seizure on keppra, remote alcohol use, dementia, Afib on eliquis, HTN, HLD, left CEA in 03/2023 presented to ED for code strokes.   Per EMS, pt was at home by himself. Last seen well was at noon time by neighbor. However, EMS was called around 3pm that neighbor found pt on the ground at front porch under direct sunshine unconscious. Not sure how long he has been at the front porch. On EMS arrival, pt was hot all over under sunshine, left gaze, nonverbal, with right UE flaccid. Code stroke was called at the scene. He received 1L bolus en route. During transportation, pt gradually improved, open eyes, starting to answer questions, moving more on the RUE, left gaze also resolved. On ED arrival, pt was orientated to place, tracking bilaterally, moving all extremities, still encephalopathic. CT and CTA head and neck no acute finding. Temp 102.1, positive AKI, but more awake alert over time.   Pt was admitted in 01/2020 for seizure, MRI and EEG neg. Put on keppra 500mg  bid. Alcohol level 31 on presentation and deemed NOT alcohol withdraw seizure. Pt has been on keppra 500mg  bid but per report he was not on since 05/2023.  Admitted in 04/2021 due to multifocal small infarcts, secondary to known afib off AC. Eliquis 5mg  bid restarted. Continued home lipitor 20. Admitted 03/2022 for aphasia and dysarthria. CT head showed age-indeterminant hypodensity R parietal lobe, potentially acute. TNK not administered 2/2 presentation outside the window and therapeutic anticoagulation with eliquis. CTA showed no LVO but did show 70% L ICA stenosis. CTP negative. MRI brain  confirmed acute infarcts in the posterior left frontal cortex and subcortical white matter, medial right parietal cortex and subcortical white matter, and right frontal white matter, with additional small subacute infarcts in the bilateral cerebellar hemispheres. Suspect cardioembolic source in the setting of questionable compliance with eliquis for a fib. Eliquis restarted on discharge. Admitted 01/2023 for 2 to 3 days of speech changes as well as generally not acting like himself and right arm weakness. MRI brain confirmed very small/punctate acute infarcts scattered within the cortex of the anterior left frontal lobe, posterior left frontal lobe, and left parietal lobe. EEG negative for seizure. Resumed eliquis but recommend VVS consult for left ICA stenosis. In 03/2023, pt admitted again for dysarthria and right sided weakness. MRI reveals a new stroke in the left postcentral gyrus. VVS consulted and left CEA done. In 05/2023, admitted for fluctuating right sided weakness, MRI showed Punctate acute to early subacute infarct in the left internal capsule/basal ganglia, and probable late subacute to early chronic infarct in the left corona radiata. Pt was continued on eliquis.   Hx of dementia but Aricept was discontinued on 01/03/2021 due to weight loss and decreased appetite. Currently on trazodone at night.  LSN: 1200 tPA Given: No: on eliquis, likely not stroke IR: no, no LVO mRS = 2  Past Medical History:  Diagnosis Date   Alcohol abuse    Dementia (HCC)    GERD (gastroesophageal reflux disease)    HLD (hyperlipidemia)    HTN (hypertension)    Seizures (HCC)    Stroke (HCC)  Past Surgical History:  Procedure Laterality Date   ENDARTERECTOMY Left 04/02/2023   Procedure: ENDARTERECTOMY CAROTID;  Surgeon: Renford Dills, MD;  Location: [facility] ORS;  Service: Vascular;  Laterality: Left;   skin cyst removal      Family History  Problem Relation Age of Onset   Stroke Mother    Cancer  Brother     Social History:  reports that he quit smoking about 19 months ago. His smoking use included cigarettes. He started smoking about 69 years ago. He has a 34 pack-year smoking history. He has never used smokeless tobacco. He reports that he does not currently use alcohol. He reports that he does not use drugs.  Allergies: No Known Allergies  No current facility-administered medications on file prior to encounter.   Current Outpatient Medications on File Prior to Encounter  Medication Sig Dispense Refill   amLODipine (NORVASC) 10 MG tablet Take 1 tablet (10 mg total) by mouth daily.     apixaban (ELIQUIS) 5 MG TABS tablet Take 1 tablet (5 mg total) by mouth 2 (two) times daily. 60 tablet 0   atorvastatin (LIPITOR) 20 MG tablet Take 20 mg by mouth every evening.     food thickener (SIMPLYTHICK, NECTAR/LEVEL 2/MILDLY THICK,) GEL Take 1 packet by mouth as needed. 100 packet 0   levETIRAcetam (KEPPRA) 500 MG tablet Take 1 tablet (500 mg total) by mouth 2 (two) times daily. (Patient not taking: Reported on 05/31/2023) 60 tablet 3   pantoprazole (PROTONIX) 40 MG tablet Take 1 tablet (40 mg total) by mouth daily.     polyethylene glycol (MIRALAX / GLYCOLAX) 17 g packet Take 17 g by mouth daily as needed.     senna-docusate (SENOKOT-S) 8.6-50 MG tablet Take 1 tablet by mouth at bedtime as needed for mild constipation or moderate constipation.     tamsulosin (FLOMAX) 0.4 MG CAPS capsule Take 1 capsule (0.4 mg total) by mouth daily after breakfast.     traZODone (DESYREL) 100 MG tablet Take 1 tablet (100 mg total) by mouth at bedtime.      Review of Systems: A full ROS was attempted today and was not able to be performed given mental status change.   Physical Examination: Temp:  [99.8 F (37.7 C)-102.1 F (38.9 C)] 102.1 F (38.9 C) (08/16 1604) Pulse Rate:  [83-89] 83 (08/16 1600) Resp:  [26] 26 (08/16 1600) BP: (105-110)/(56-73) 110/73 (08/16 1600) SpO2:  [94 %-97 %] 97 % (08/16  1600) Weight:  [65.2 kg] 65.2 kg (08/16 1555)  General - well nourished, well developed, in no apparent distress, lethargic.    Ophthalmologic - fundi not visualized due to noncooperation.    Cardiovascular - regular rhythm and rate, not in afib  Neuro - awake, alert, eyes open, orientated to place and self, but not to time or age. Very limited speech output, but able to tell me "hospital" "I don't know". Following some simple commands. No gaze palsy, tracking bilaterally, not consistently blinking to visual threat bilaterally. No facial droop. Tongue protrusion not cooperative. RUE drift to bed within 10 sec, LUE drift to bed beyond 10 sec. LLE 3/5 no drift, RLE 3/5 with drift to bed within 5 sec. Sensation, coordination not cooperative and gait not tested.  NIH Stroke Scale  Level Of Consciousness 0=Alert; keenly responsive 1=Arouse to minor stimulation 2=Requires repeated stimulation to arouse or movements to pain 3=postures or unresponsive 0  LOC Questions to Month and Age 23=Answers both questions correctly 1=Answers one question correctly  or dysarthria/intubated/trauma/language barrier 2=Answers neither question correctly or aphasia 1  LOC Commands      -Open/Close eyes     -Open/close grip     -Pantomime commands if communication barrier 0=Performs both tasks correctly 1=Performs one task correctly 2=Performs neighter task correctly 1  Best Gaze     -Only assess horizontal gaze 0=Normal 1=Partial gaze palsy 2=Forced deviation, or total gaze paresis 0  Visual 0=No visual loss 1=Partial hemianopia 2=Complete hemianopia 3=Bilateral hemianopia (blind including cortical blindness) 0  Facial Palsy     -Use grimace if obtunded 0=Normal symmetrical movement 1=Minor paralysis (asymmetry) 2=Partial paralysis (lower face) 3=Complete paralysis (upper and lower face) 0  Motor  0=No drift for 10/5 seconds 1=Drift, but does not hit bed 2=Some antigravity effort, hits  bed 3=No  effort against gravity, limb falls 4=No movement 0=Amputation/joint fusion Right Arm 2     Leg 2    Left Arm 1     Leg 0  Limb Ataxia     - FNT/HTS 0=Absent or does not understand or paralyzed or amputation/joint fusion 1=Present in one limb 2=Present in two limbs 0  Sensory 0=Normal 1=Mild to moderate sensory loss 2=Severe to total sensory loss or coma/unresponsive 1  Best Language 0=No aphasia, normal 1=Mild to moderate aphasia 2=Severe aphasia 3=Mute, global aphasia, or coma/unresponsive 1  Dysarthria 0=Normal 1=Mild to moderate 2=Severe, unintelligible or mute/anarthric 0=intubated/unable to test 2  Extinction/Neglect 0=No abnormality 1=visual/tactile/auditory/spatia/personal inattention/Extinction to bilateral simultaneous stimulation 2=Profound neglect/extinction more than 1 modality  1  Total   12      Data Reviewed: CT ABDOMEN PELVIS W CONTRAST  Result Date: 06/30/2023 CLINICAL DATA:  Bowel obstruction suspected EXAM: CT ABDOMEN AND PELVIS WITH CONTRAST TECHNIQUE: Multidetector CT imaging of the abdomen and pelvis was performed using the standard protocol following bolus administration of intravenous contrast. RADIATION DOSE REDUCTION: This exam was performed according to the departmental dose-optimization program which includes automated exposure control, adjustment of the mA and/or kV according to patient size and/or use of iterative reconstruction technique. CONTRAST:  OMNIPAQUE IOHEXOL 300 MG/ML  SOLN COMPARISON:  CT abdomen pelvis 05/31/2023 or vomiting. Ongoing since Saturday- unable to keep anything down. Denies pain. Hx dementia FINDINGS: Lower chest: Grossly stable moderate to large volume hiatal hernia containing the majority of the of the stomach. Hepatobiliary: No focal liver abnormality. Calcified gallstone noted within the gallbladder lumen. No gallbladder wall thickening or pericholecystic fluid. No biliary dilatation. Pancreas: No focal lesion. Normal  pancreatic contour. No surrounding inflammatory changes. No main pancreatic ductal dilatation. Spleen: Normal in size without focal abnormality. Adrenals/Urinary Tract: No adrenal nodule bilaterally. Bilateral kidneys enhance symmetrically. Fluid density lesions likely represent simple renal cysts. Simple renal cysts, in the absence of clinically indicated signs/symptoms, require no independent follow-up. Subcentimeter hypodensities are too small to characterize-no further follow-up indicated. Calcifications associated with kidneys likely vascular. No definite nephroureterolithiasis. No hydronephrosis. No hydroureter. The urinary bladder is unremarkable. Stomach/Bowel: Stomach is within normal limits. No evidence of bowel wall thickening or dilatation. Few scattered colonic diverticula. Appendix appears normal. Vascular/Lymphatic: No abdominal aorta or iliac aneurysm. Severe atherosclerotic plaque of the aorta and its branches. No abdominal, pelvic, or inguinal lymphadenopathy. Reproductive: Prostate is unremarkable. Other: No intraperitoneal free fluid. No intraperitoneal free gas. No organized fluid collection. Musculoskeletal: No abdominal wall hernia or abnormality. No suspicious lytic or blastic osseous lesions. No acute displaced fracture. Levocurvature of the lumbar spine centered at the L3-L4 level. Stable chronic L1 superior endplate compression fracture. Old healed  right pelvic fracture. Old healed right rib fractures. Multilevel degenerative changes of the spine most prominent at the L3-L4 leve with associated endplate sclerosis and intervertebral disc space narrowing. Intervertebral disc space vacuum phenomenon at the L4-L5 level. IMPRESSION: 1. No acute intra-abdominal or intrapelvic abnormality. 2. Grossly stable moderate to large volume hiatal hernia containing the majority of the of the stomach. 3. Colonic diverticulosis with no acute diverticulitis. 4.  Aortic Atherosclerosis (ICD10-I70.0).  Electronically Signed   By: Tish Frederickson M.D.   On: 06/30/2023 17:34   CT Head Wo Contrast  Result Date: 06/30/2023 CLINICAL DATA:  ICP elevation suspected, acute (Ped >= 52mo) recent stroke, now with intractable vomting, eval for ich EXAM: CT HEAD WITHOUT CONTRAST TECHNIQUE: Contiguous axial images were obtained from the base of the skull through the vertex without intravenous contrast. RADIATION DOSE REDUCTION: This exam was performed according to the departmental dose-optimization program which includes automated exposure control, adjustment of the mA and/or kV according to patient size and/or use of iterative reconstruction technique. COMPARISON:  CT head 06/01/2023 FINDINGS: Brain: Cerebral ventricle sizes are concordant with the degree of cerebral volume loss. Patchy and confluent areas of decreased attenuation are noted throughout the deep and periventricular white matter of the cerebral hemispheres bilaterally, compatible with chronic microvascular ischemic disease. Left frontal lobe encephalomalacia. No evidence of large-territorial acute infarction. No parenchymal hemorrhage. No mass lesion. No extra-axial collection. No mass effect or midline shift. No hydrocephalus. Basilar cisterns are patent. Vascular: No hyperdense vessel. Atherosclerotic calcifications are present within the cavernous internal carotid and vertebral arteries. Skull: No acute fracture or focal lesion. Sinuses/Orbits: Paranasal sinuses and mastoid air cells are clear. The orbits are unremarkable. Other: None. IMPRESSION: No acute intracranial abnormality. Electronically Signed   By: Tish Frederickson M.D.   On: 06/30/2023 17:06   DG Chest 2 View  Result Date: 06/30/2023 CLINICAL DATA:  vomiting, exclude aspiration EXAM: CHEST - 2 VIEW COMPARISON:  CXR 05/31/23 FINDINGS: Pleural effusion. No pneumothorax. Unchanged cardiac and mediastinal contours. No new focal airspace opacity. There are chronic fractures of posterior left ribs 5  and 6. Visualized upper abdomen is unremarkable. IMPRESSION: No new finding to suggest aspiration. Electronically Signed   By: Lorenza Cambridge M.D.   On: 06/30/2023 16:56    Assessment: 82 y.o. male with PMH of recurrent strokes, seizure on keppra, remote alcohol use, dementia, Afib on eliquis, HTN, HLD, left CEA in 03/2023 presented to ED found on the ground at front porch under direct sunshine unconscious. Not sure how long he has been at the front porch. On EMS arrival, pt was hot all over under sunshine, left gaze, nonverbal, with right UE flaccid. Code stroke was called at the scene. He received 1L bolus en route. During transportation, pt gradually improved, open eyes, starting to answer questions, moving more on the RUE, left gaze also resolved. On ED arrival, pt was orientated to place, tracking bilaterally, moving all extremities, still encephalopathic. CT and CTA head and neck no acute finding. Temp 102.1, positive AKI, but more awake alert over time. Not TNK candidate due to on eliquis, not IR candidate given no LVO.  Pt symptoms concerning for seizure either triggered by previous cortical strokes or heat stroke under direct sunshine with core temp 102. Will given IVF hydration, check stat EEG, MRI brain, load with keppra. Continue infectious and encephalopathy work up. Discussed with EDP.    Plan: Continue further work up, recommend admission for at least overnight observation Frequent neuro checks Telemetry  monitoring MRI brain  Stat EEG Ammonia, TSH, B12 Keppra load with 2g and continue maintenance dose 500mg  bid IV hydration for AKI PT/OT/speech consult OK to continue eliquis once PO access Swallow evaluation Discussed with Dr. Suezanne Jacquet ED physician We will follow  Thank you for this consultation and allowing [place] to participate in the care of this patient.   Marvel Plan, MD PhD Stroke Neurology 07/11/2023 6:26 PM

## 2023-07-11 NOTE — Code Documentation (Signed)
Richard Carter is an 82 yr old male with a PMH of HTN AF CVA on Eliquis presenting to [facility] on 07/11/2023 via EMS. Pt is from home where he was last known well today at noon. A bystander found him lying outside in the sun sometime later and called EMS. At the time, he had a left gaze and rt sided weakness, and code stroke was activated. Pt is on Eliquis.    Pt met at bridge by Stroke team. Labs, CBG obtained. Pt's gaze has resolved, and he is now more alert.  Airway cleared by EDP.  Pt to CT with team. NIHSS 11. Pt with drowsiness, weakness in all extremities, disoriented and dysarthric. The following imaging was obtained: CT, CTA. Per Dr. Roda Shutters, CT neg for acute abnormality and CTA neg for LVO.     Pt back to ED room 23 where his workup will continue. He will need q 2 hr VS and NIHSS for 12 hours then q 4. Pt is not a candidate for thrombolytics due to Eliquis. He is ineligible for thrombectomy due to LVO negative. Bedside handoff with ED RN complete.

## 2023-07-11 NOTE — ED Notes (Signed)
Ice packs applied to body and neck

## 2023-07-11 NOTE — Progress Notes (Addendum)
Pharmacy Antibiotic Note  Richard Carter is a 82 y.o. male admitted on 07/11/2023 with sepsis.  WBC 11.2, afebrile. Pharmacy has been consulted for cefepime and vancomycin dosing.  Plan: Vancomycin 1500 mg IV x 1  Then start vancomycin 1250 mg IV every 48 hours (eAUC 517) Start cefepime 2 g IV every 24 hours Monitor WBC, temperature, cx, and renal function   Height: 5\' 7"  (170.2 cm) Weight: 65.2 kg (143 lb 11.8 oz) IBW/kg (Calculated) : 66.1  Temp (24hrs), Avg:99.3 F (37.4 C), Min:95.9 F (35.5 C), Max:102.1 F (38.9 C)  Recent Labs  Lab 07/11/23 1540 07/11/23 1725  WBC  --  11.2*  CREATININE 2.10* 2.01*    Estimated Creatinine Clearance: 25.7 mL/min (A) (by C-G formula based on SCr of 2.01 mg/dL (H)).    No Known Allergies  Antimicrobials this admission: 8/16 vancomycin >>  8/16 cefepime >>   Dose adjustments this admission:   Microbiology results: 8/16 BCx:   Thank you for allowing pharmacy to be a part of this patient's care.  Griffin Dakin 07/11/2023 10:55 PM

## 2023-07-11 NOTE — Procedures (Signed)
Patient Name: KERRIGAN VANSKIVER  MRN: [id number]  Epilepsy Attending: Charlsie Quest  Referring Physician/Provider: Marjorie Smolder, NP  Date: 07/11/2023 Duration: 23.36 mins  Patient history: 82yo M with left gaze deviation and right sided weakness getting eeg to evaluate for seizure.  Level of alertness: Awake, asleep  AEDs during EEG study: None  Technical aspects: This EEG study was done with scalp electrodes positioned according to the 10-20 International system of electrode placement. Electrical activity was reviewed with band pass filter of 1-70Hz , sensitivity of 7 uV/mm, display speed of 28mm/sec with a 60Hz  notched filter applied as appropriate. EEG data were recorded continuously and digitally stored.  Video monitoring was available and reviewed as appropriate.  Description: The posterior dominant rhythm consists of 7.5 Hz activity of moderate voltage (25-35 uV) seen predominantly in posterior head regions, symmetric and reactive to eye opening and eye closing. Sleep was characterized by vertex waves, sleep spindles (12 to 14 Hz), maximal frontocentral region. Intermittent generalized 3-6hz  theta-delta slowing was noted. Hyperventilation and photic stimulation were not performed.     ABNORMALITY - Intermittent slow, generalized  IMPRESSION: This study is suggestive of mild diffuse encephalopathy. No seizures or epileptiform discharges were seen throughout the recording.  Please note lack of epileptiform activity during interictal EEG does not exclude the diagnosis of epilepsy.   Carlosdaniel Grob Annabelle Harman

## 2023-07-11 NOTE — ED Notes (Signed)
MD notified patient NPO

## 2023-07-12 ENCOUNTER — Other Ambulatory Visit (HOSPITAL_COMMUNITY): Payer: Medicare HMO

## 2023-07-12 ENCOUNTER — Inpatient Hospital Stay (HOSPITAL_COMMUNITY): Payer: Medicare HMO

## 2023-07-12 DIAGNOSIS — G9340 Encephalopathy, unspecified: Secondary | ICD-10-CM | POA: Diagnosis not present

## 2023-07-12 LAB — COMPREHENSIVE METABOLIC PANEL
ALT: 21 U/L (ref 0–44)
AST: 73 U/L — ABNORMAL HIGH (ref 15–41)
Albumin: 2.9 g/dL — ABNORMAL LOW (ref 3.5–5.0)
Alkaline Phosphatase: 68 U/L (ref 38–126)
Anion gap: 9 (ref 5–15)
BUN: 21 mg/dL (ref 8–23)
CO2: 17 mmol/L — ABNORMAL LOW (ref 22–32)
Calcium: 8.5 mg/dL — ABNORMAL LOW (ref 8.9–10.3)
Chloride: 110 mmol/L (ref 98–111)
Creatinine, Ser: 1.9 mg/dL — ABNORMAL HIGH (ref 0.61–1.24)
GFR, Estimated: 35 mL/min — ABNORMAL LOW (ref >60)
Glucose, Bld: 103 mg/dL — ABNORMAL HIGH (ref 70–99)
Potassium: 3.5 mmol/L (ref 3.5–5.1)
Sodium: 136 mmol/L (ref 135–145)
Total Bilirubin: 0.3 mg/dL (ref 0.3–1.2)
Total Protein: 6.3 g/dL — ABNORMAL LOW (ref 6.5–8.1)

## 2023-07-12 LAB — URINALYSIS, COMPLETE (UACMP) WITH MICROSCOPIC
Bilirubin Urine: NEGATIVE
Glucose, UA: NEGATIVE mg/dL
Ketones, ur: NEGATIVE mg/dL
Nitrite: POSITIVE — AB
Protein, ur: 30 mg/dL — AB
RBC / HPF: >50 RBC/hpf (ref 0–5)
Specific Gravity, Urine: 1.019 (ref 1.005–1.030)
WBC, UA: >50 WBC/hpf (ref 0–5)
pH: 5 (ref 5.0–8.0)

## 2023-07-12 LAB — DIFFERENTIAL
Abs Immature Granulocytes: 0.07 10*3/uL (ref 0.00–0.07)
Basophils Absolute: 0 10*3/uL (ref 0.0–0.1)
Basophils Relative: 0 %
Eosinophils Absolute: 0 10*3/uL (ref 0.0–0.5)
Eosinophils Relative: 0 %
Immature Granulocytes: 1 %
Lymphocytes Relative: 10 %
Lymphs Abs: 1.1 10*3/uL (ref 0.7–4.0)
Monocytes Absolute: 1.4 10*3/uL — ABNORMAL HIGH (ref 0.1–1.0)
Monocytes Relative: 12 %
Neutro Abs: 8.4 10*3/uL — ABNORMAL HIGH (ref 1.7–7.7)
Neutrophils Relative %: 77 %

## 2023-07-12 LAB — MAGNESIUM: Magnesium: 1.3 mg/dL — ABNORMAL LOW (ref 1.7–2.4)

## 2023-07-12 LAB — CBC
HCT: 28 % — ABNORMAL LOW (ref 39.0–52.0)
Hemoglobin: 9.2 g/dL — ABNORMAL LOW (ref 13.0–17.0)
MCH: 29.1 pg (ref 26.0–34.0)
MCHC: 32.9 g/dL (ref 30.0–36.0)
MCV: 88.6 fL (ref 80.0–100.0)
Platelets: 236 10*3/uL (ref 150–400)
RBC: 3.16 MIL/uL — ABNORMAL LOW (ref 4.22–5.81)
RDW: 16 % — ABNORMAL HIGH (ref 11.5–15.5)
WBC: 10.9 10*3/uL — ABNORMAL HIGH (ref 4.0–10.5)
nRBC: 0 % (ref 0.0–0.2)

## 2023-07-12 LAB — LACTIC ACID, PLASMA: Lactic Acid, Venous: 1 mmol/L (ref 0.5–1.9)

## 2023-07-12 LAB — MRSA NEXT GEN BY PCR, NASAL: MRSA by PCR Next Gen: NOT DETECTED

## 2023-07-12 LAB — PROCALCITONIN: Procalcitonin: 17.61 ng/mL

## 2023-07-12 LAB — PHOSPHORUS: Phosphorus: 2.9 mg/dL (ref 2.5–4.6)

## 2023-07-12 MED ORDER — AMLODIPINE BESYLATE 10 MG PO TABS: 10.0000 mg | ORAL_TABLET | Freq: Every day | ORAL | Status: DC

## 2023-07-12 MED ORDER — HYDROCODONE-ACETAMINOPHEN 5-325 MG PO TABS: 1.0000 | ORAL_TABLET | ORAL | Status: DC | PRN

## 2023-07-12 MED ORDER — BISACODYL 5 MG PO TBEC: 5.0000 mg | DELAYED_RELEASE_TABLET | Freq: Every day | ORAL | Status: DC | PRN

## 2023-07-12 MED ORDER — ALBUTEROL SULFATE (2.5 MG/3ML) 0.083% IN NEBU: 2.5000 mg | INHALATION_SOLUTION | Freq: Four times a day (QID) | RESPIRATORY_TRACT | Status: DC | PRN

## 2023-07-12 MED ORDER — ACETAMINOPHEN 325 MG PO TABS
650.0000 mg | ORAL_TABLET | Freq: Four times a day (QID) | ORAL | Status: DC | PRN
  Administered 2023-07-17: 650 mg via ORAL
  Filled 2023-07-12: qty 2

## 2023-07-12 MED ORDER — HYDRALAZINE HCL 20 MG/ML IJ SOLN
5.0000 mg | Freq: Three times a day (TID) | INTRAMUSCULAR | Status: DC | PRN
  Filled 2023-07-12: qty 1

## 2023-07-12 MED ORDER — IPRATROPIUM BROMIDE 0.02 % IN SOLN: 0.5000 mg | Freq: Four times a day (QID) | RESPIRATORY_TRACT | Status: DC | PRN

## 2023-07-12 MED ORDER — TAMSULOSIN HCL 0.4 MG PO CAPS
0.4000 mg | ORAL_CAPSULE | Freq: Every day | ORAL | Status: DC
  Administered 2023-07-12 – 2023-07-18 (×7): 0.4 mg via ORAL
  Filled 2023-07-12 (×7): qty 1

## 2023-07-12 MED ORDER — ACETAMINOPHEN 650 MG RE SUPP: 650.0000 mg | Freq: Four times a day (QID) | RECTAL | Status: DC | PRN

## 2023-07-12 MED ORDER — SODIUM CHLORIDE 0.9% FLUSH
3.0000 mL | Freq: Two times a day (BID) | INTRAVENOUS | Status: DC
  Administered 2023-07-12 – 2023-07-18 (×12): 3 mL via INTRAVENOUS

## 2023-07-12 MED ORDER — SODIUM CHLORIDE 0.9 % IV SOLN: INTRAVENOUS | Status: DC

## 2023-07-12 MED ORDER — SENNOSIDES-DOCUSATE SODIUM 8.6-50 MG PO TABS: 1.0000 | ORAL_TABLET | Freq: Every evening | ORAL | Status: DC | PRN

## 2023-07-12 MED ORDER — APIXABAN 2.5 MG PO TABS
2.5000 mg | ORAL_TABLET | Freq: Two times a day (BID) | ORAL | Status: DC
  Administered 2023-07-12 – 2023-07-14 (×4): 2.5 mg via ORAL
  Filled 2023-07-12 (×4): qty 1

## 2023-07-12 MED ORDER — MORPHINE SULFATE (PF) 2 MG/ML IV SOLN: 1.0000 mg | Freq: Four times a day (QID) | INTRAVENOUS | Status: DC | PRN

## 2023-07-12 MED ORDER — ATORVASTATIN CALCIUM 10 MG PO TABS
20.0000 mg | ORAL_TABLET | Freq: Every evening | ORAL | Status: DC
  Administered 2023-07-12 – 2023-07-17 (×6): 20 mg via ORAL
  Filled 2023-07-12 (×7): qty 2

## 2023-07-12 MED ORDER — SODIUM CHLORIDE 0.9 % IV SOLN: 2.0000 g | Freq: Once | INTRAVENOUS | Status: DC

## 2023-07-12 MED ORDER — SODIUM CHLORIDE 0.9 % IV SOLN
1.0000 g | INTRAVENOUS | Status: DC
  Administered 2023-07-12 – 2023-07-14 (×3): 1 g via INTRAVENOUS
  Filled 2023-07-12 (×3): qty 10

## 2023-07-12 MED ORDER — TRAZODONE HCL 50 MG PO TABS
25.0000 mg | ORAL_TABLET | Freq: Every evening | ORAL | Status: DC | PRN
  Administered 2023-07-12 – 2023-07-14 (×2): 25 mg via ORAL
  Filled 2023-07-12 (×2): qty 1

## 2023-07-12 MED ORDER — VANCOMYCIN HCL IN DEXTROSE 1-5 GM/200ML-% IV SOLN: 1000.0000 mg | Freq: Once | INTRAVENOUS | Status: DC

## 2023-07-12 MED ORDER — ONDANSETRON HCL 4 MG/2ML IJ SOLN: 4.0000 mg | Freq: Four times a day (QID) | INTRAMUSCULAR | Status: DC | PRN

## 2023-07-12 MED ORDER — POTASSIUM CHLORIDE 10 MEQ/100ML IV SOLN
10.0000 meq | INTRAVENOUS | Status: AC
  Administered 2023-07-12 (×3): 10 meq via INTRAVENOUS
  Filled 2023-07-12 (×3): qty 100

## 2023-07-12 MED ORDER — METRONIDAZOLE 500 MG/100ML IV SOLN
500.0000 mg | Freq: Two times a day (BID) | INTRAVENOUS | Status: DC
  Administered 2023-07-12: 500 mg via INTRAVENOUS
  Filled 2023-07-12: qty 100

## 2023-07-12 MED ORDER — PANTOPRAZOLE SODIUM 40 MG PO TBEC
40.0000 mg | DELAYED_RELEASE_TABLET | Freq: Every day | ORAL | Status: DC
  Administered 2023-07-12 – 2023-07-18 (×7): 40 mg via ORAL
  Filled 2023-07-12 (×7): qty 1

## 2023-07-12 MED ORDER — APIXABAN 5 MG PO TABS
5.0000 mg | ORAL_TABLET | Freq: Two times a day (BID) | ORAL | Status: DC
  Administered 2023-07-12: 5 mg via ORAL
  Filled 2023-07-12: qty 1

## 2023-07-12 MED ORDER — ONDANSETRON HCL 4 MG PO TABS: 4.0000 mg | ORAL_TABLET | Freq: Four times a day (QID) | ORAL | Status: DC | PRN

## 2023-07-12 NOTE — ED Notes (Signed)
ED TO INPATIENT HANDOFF REPORT  ED Nurse Name and Phone #: Theadora Rama RN [phone]   S Name/Age/Gender Richard Carter 82 y.o. male Room/Bed: 023C/023C  Code Status   Code Status: Full Code  Home/SNF/Other Home Patient oriented to: self Is this baseline? No   Triage Complete: Triage complete  Chief Complaint Encephalopathy acute [G93.40]  Triage Note No notes on file   Allergies No Known Allergies  Level of Care/Admitting Diagnosis ED Disposition     ED Disposition  Admit   Condition  --   Comment  Hospital Area: [facility] [[id number]]  Level of Care: Telemetry Medical [104]  May admit patient to [facility] or [facility] if equivalent level of care is available:: No  Covid Evaluation: Confirmed COVID Negative  Diagnosis: Encephalopathy acute [247508]  Admitting Physician: Tonye Royalty [[id number]]  Attending Physician: Janann Colonel  Certification:: I certify this patient will need inpatient services for at least 2 midnights  Expected Medical Readiness: 07/13/2023          B Medical/Surgery History Past Medical History:  Diagnosis Date   Alcohol abuse    Dementia (HCC)    GERD (gastroesophageal reflux disease)    HLD (hyperlipidemia)    HTN (hypertension)    Seizures (HCC)    Stroke [facility])    Past Surgical History:  Procedure Laterality Date   ENDARTERECTOMY Left 04/02/2023   Procedure: ENDARTERECTOMY CAROTID;  Surgeon: Renford Dills, MD;  Location: [facility] ORS;  Service: Vascular;  Laterality: Left;   skin cyst removal       A IV Location/Drains/Wounds Patient Lines/Drains/Airways Status     Active Line/Drains/Airways     Name Placement date Placement time Site Days   Peripheral IV 07/11/23 18 G Left Forearm 07/11/23  1530  Forearm  1   Peripheral IV 07/11/23 20 G Left Hand 07/11/23  1713  Hand  1            Intake/Output Last 24 hours  Intake/Output Summary (Last 24 hours) at 07/12/2023 0019 Last  data filed at 07/11/2023 2049 Gross per 24 hour  Intake 250 ml  Output --  Net 250 ml    Labs/Imaging Results for orders placed or performed during the hospital encounter of 07/11/23 (from the past 48 hour(s))  CBG monitoring, ED     Status: Abnormal   Collection Time: 07/11/23  3:35 PM  Result Value Ref Range   Glucose-Capillary 130 (H) 70 - 99 mg/dL    Comment: Glucose reference range applies only to samples taken after fasting for at least 8 hours.  I-stat chem 8, ED     Status: Abnormal   Collection Time: 07/11/23  3:40 PM  Result Value Ref Range   Sodium 138 135 - 145 mmol/L   Potassium 3.2 (L) 3.5 - 5.1 mmol/L   Chloride 109 98 - 111 mmol/L   BUN 21 8 - 23 mg/dL   Creatinine, Ser 9.56 (H) 0.61 - 1.24 mg/dL   Glucose, Bld 213 (H) 70 - 99 mg/dL    Comment: Glucose reference range applies only to samples taken after fasting for at least 8 hours.   Calcium, Ion 1.21 1.15 - 1.40 mmol/L   TCO2 15 (L) 22 - 32 mmol/L   Hemoglobin 9.9 (L) 13.0 - 17.0 g/dL   HCT 08.6 (L) 57.8 - 46.9 %  Ammonia     Status: None   Collection Time: 07/11/23  5:04 PM  Result Value Ref Range  Ammonia 20 9 - 35 umol/L    Comment: Performed at [facility], [address], [place], [place] [address]  Protime-INR     Status: Abnormal   Collection Time: 07/11/23  5:25 PM  Result Value Ref Range   Prothrombin Time 26.6 (H) 11.4 - 15.2 seconds   INR 2.4 (H) 0.8 - 1.2    Comment: (NOTE) INR goal varies based on device and disease states. Performed at [facility], [address] [place], [place] [address]   APTT     Status: Abnormal   Collection Time: 07/11/23  5:25 PM  Result Value Ref Range   aPTT 45 (H) 24 - 36 seconds    Comment:        IF BASELINE aPTT IS ELEVATED, SUGGEST PATIENT RISK ASSESSMENT BE USED TO DETERMINE APPROPRIATE ANTICOAGULANT THERAPY. Performed at [facility], [address], [place], [place] [address]   CBC     Status: Abnormal   Collection Time:  07/11/23  5:25 PM  Result Value Ref Range   WBC 11.2 (H) 4.0 - 10.5 K/uL   RBC 3.24 (L) 4.22 - 5.81 MIL/uL   Hemoglobin 9.8 (L) 13.0 - 17.0 g/dL   HCT 10.1 (L) 75.1 - 02.5 %   MCV 90.4 80.0 - 100.0 fL   MCH 30.2 26.0 - 34.0 pg   MCHC 33.4 30.0 - 36.0 g/dL   RDW 85.2 (H) 77.8 - 24.2 %   Platelets 223 150 - 400 K/uL   nRBC 0.0 0.0 - 0.2 %    Comment: Performed at [facility], [address] [place], [place] [address]  Differential     Status: Abnormal   Collection Time: 07/11/23  5:25 PM  Result Value Ref Range   Neutrophils Relative % 85 %   Neutro Abs 9.4 (H) 1.7 - 7.7 K/uL   Lymphocytes Relative 5 %   Lymphs Abs 0.6 (L) 0.7 - 4.0 K/uL   Monocytes Relative 9 %   Monocytes Absolute 1.0 0.1 - 1.0 K/uL   Eosinophils Relative 0 %   Eosinophils Absolute 0.0 0.0 - 0.5 K/uL   Basophils Relative 0 %   Basophils Absolute 0.0 0.0 - 0.1 K/uL   Immature Granulocytes 1 %   Abs Immature Granulocytes 0.16 (H) 0.00 - 0.07 K/uL    Comment: Performed at [facility], [address], [place], [place] [address]  Comprehensive metabolic panel     Status: Abnormal   Collection Time: 07/11/23  5:25 PM  Result Value Ref Range   Sodium 134 (L) 135 - 145 mmol/L   Potassium 3.0 (L) 3.5 - 5.1 mmol/L   Chloride 108 98 - 111 mmol/L   CO2 15 (L) 22 - 32 mmol/L   Glucose, Bld 138 (H) 70 - 99 mg/dL    Comment: Glucose reference range applies only to samples taken after fasting for at least 8 hours.   BUN 22 8 - 23 mg/dL   Creatinine, Ser 4.00 (H) 0.61 - 1.24 mg/dL   Calcium 8.7 (L) 8.9 - 10.3 mg/dL   Total Protein 6.3 (L) 6.5 - 8.1 g/dL   Albumin 2.8 (L) 3.5 - 5.0 g/dL   AST 16 15 - 41 U/L   ALT 13 0 - 44 U/L   Alkaline Phosphatase 71 38 - 126 U/L   Total Bilirubin 0.6 0.3 - 1.2 mg/dL   GFR, Estimated 32 (L) >60 mL/min    Comment: (NOTE) Calculated using  the CKD-EPI Creatinine Equation (2021)    Anion gap 11 5 - 15    Comment: Performed at [facility], [address],  [place], [place] [address]  Ethanol     Status: None   Collection Time: 07/11/23  5:25 PM  Result Value Ref Range   Alcohol, Ethyl (B) <10 <10 mg/dL    Comment: (NOTE) Lowest detectable limit for serum alcohol is 10 mg/dL.  For medical purposes only. Performed at [facility], [address] [place], [place] [address]   CK     Status: None   Collection Time: 07/11/23  5:25 PM  Result Value Ref Range   Total CK 156 49 - 397 U/L    Comment: Performed at [facility], [address] [place], [place] [address]  TSH     Status: None   Collection Time: 07/11/23  5:26 PM  Result Value Ref Range   TSH 1.844 0.350 - 4.500 uIU/mL    Comment: Performed by a 3rd Generation assay with a functional sensitivity of <=0.01 uIU/mL. Performed at [facility], [address], [place], [place] [address]   Vitamin B12     Status: None   Collection Time: 07/11/23  5:26 PM  Result Value Ref Range   Vitamin B-12 671 180 - 914 pg/mL    Comment: (NOTE) This assay is not validated for testing neonatal or myeloproliferative syndrome specimens for Vitamin B12 levels. Performed at [facility], [address], [place], [place] [address]   Resp panel by RT-PCR (RSV, Flu A&B, Covid) Anterior Nasal Swab     Status: None   Collection Time: 07/11/23  8:25 PM   Specimen: Anterior Nasal Swab  Result Value Ref Range   SARS Coronavirus 2 by RT PCR NEGATIVE NEGATIVE   Influenza A by PCR NEGATIVE NEGATIVE   Influenza B by PCR NEGATIVE NEGATIVE    Comment: (NOTE) The Xpert Xpress SARS-CoV-2/FLU/RSV plus assay is intended as an aid in the diagnosis of influenza from Nasopharyngeal swab specimens and should not be used as a sole basis for treatment. Nasal washings and aspirates are unacceptable for Xpert Xpress SARS-CoV-2/FLU/RSV testing.  Fact Sheet for Patients: BloggerCourse.com  Fact Sheet for Healthcare  Providers: SeriousBroker.it  This test is not yet approved or cleared by the [place] FDA and has been authorized for detection and/or diagnosis of SARS-CoV-2 by FDA under an Emergency Use Authorization (EUA). This EUA will remain in effect (meaning this test can be used) for the duration of the COVID-19 declaration under Section 564(b)(1) of the Act, 21 U.S.C. section 360bbb-3(b)(1), unless the authorization is terminated or revoked.     Resp Syncytial Virus by PCR NEGATIVE NEGATIVE    Comment: (NOTE) Fact Sheet for Patients: BloggerCourse.com  Fact Sheet for Healthcare Providers: SeriousBroker.it  This test is not yet approved or cleared by the [place] FDA and has been authorized for detection and/or diagnosis of SARS-CoV-2 by FDA under an Emergency Use Authorization (EUA). This EUA will remain in effect (meaning this test can be used) for the duration of the COVID-19 declaration under Section 564(b)(1) of the Act, 21 U.S.C. section 360bbb-3(b)(1), unless the authorization is terminated or revoked.  Performed at [facility], [address], [place], [place] [address]    DG CHEST PORT 1 VIEW  Result Date: 07/11/2023 CLINICAL DATA:  Respiratory distress chest EXAM: PORTABLE CHEST 1 VIEW COMPARISON:  Chest x-ray dated June 30, 2023  FINDINGS: Unchanged cardiac and mediastinal contours. Widening of the right paratracheal region, unchanged when compared with the prior pleural priors and likely due to goiter or vascular tortuosity. Moderate hiatal hernia. No focal consolidation. No evidence of pleural effusion or pneumothorax. Old left-sided rib fractures. IMPRESSION: 1. No acute cardiopulmonary process. 2. Moderate hiatal hernia. Electronically Signed   By: Allegra Lai M.D.   On: 07/11/2023 16:39   EEG adult  Result Date: 07/11/2023 Charlsie Quest, MD     07/11/2023  8:12 PM Patient  Name: Richard Carter MRN: [id number] Epilepsy Attending: Charlsie Quest Referring Physician/Provider: Marjorie Smolder, NP Date: 07/11/2023 Duration: 23.36 mins Patient history: 82yo M with left gaze deviation and right sided weakness getting eeg to evaluate for seizure. Level of alertness: Awake, asleep AEDs during EEG study: None Technical aspects: This EEG study was done with scalp electrodes positioned according to the 10-20 International system of electrode placement. Electrical activity was reviewed with band pass filter of 1-70Hz , sensitivity of 7 uV/mm, display speed of 35mm/sec with a 60Hz  notched filter applied as appropriate. EEG data were recorded continuously and digitally stored.  Video monitoring was available and reviewed as appropriate. Description: The posterior dominant rhythm consists of 7.5 Hz activity of moderate voltage (25-35 uV) seen predominantly in posterior head regions, symmetric and reactive to eye opening and eye closing. Sleep was characterized by vertex waves, sleep spindles (12 to 14 Hz), maximal frontocentral region. Intermittent generalized 3-6hz  theta-delta slowing was noted. Hyperventilation and photic stimulation were not performed.   ABNORMALITY - Intermittent slow, generalized IMPRESSION: This study is suggestive of mild diffuse encephalopathy. No seizures or epileptiform discharges were seen throughout the recording. Please note lack of epileptiform activity during interictal EEG does not exclude the diagnosis of epilepsy. Charlsie Quest   CT HEAD CODE STROKE WO CONTRAST  Result Date: 07/11/2023 CLINICAL DATA:  Code stroke. Left gaze, right-sided weakness. Responsive. EXAM: CT ANGIOGRAPHY HEAD AND NECK TECHNIQUE: Multidetector CT imaging of the head and neck was performed using the standard protocol during bolus administration of intravenous contrast. Multiplanar CT image reconstructions and MIPs were obtained to evaluate the vascular anatomy. Carotid stenosis  measurements (when applicable) are obtained utilizing NASCET criteria, using the distal internal carotid diameter as the denominator. RADIATION DOSE REDUCTION: This exam was performed according to the departmental dose-optimization program which includes automated exposure control, adjustment of the mA and/or kV according to patient size and/or use of iterative reconstruction technique. CONTRAST:  75mL OMNIPAQUE IOHEXOL 350 MG/ML SOLN COMPARISON:  CT head 06/30/2023, CTA head and neck 02/18/2023 FINDINGS: CT HEAD FINDINGS Brain: There is no acute intracranial hemorrhage, extra-axial fluid collection, or acute infarct There is unchanged parenchymal volume loss with prominence of the ventricular system and extra-axial CSF spaces. The ventricles are stable in size. The remote cortical infarcts in the left frontal lobe posteriorly and right parietal lobe and small remote infarcts in the left corona radiata are unchanged. The pituitary and suprasellar region are normal. There is no mass lesion. There is no mass effect or midline shift. Vascular: See below. Skull: Normal. Negative for fracture or focal lesion. Sinuses/Orbits: There is mild mucosal thickening in the paranasal sinuses. The globes and orbits are unremarkable. Other: The mastoid air cells and middle ear cavities are clear. ASPECTS [facility] Stroke Program Early CT Score) - Ganglionic level infarction (caudate, lentiform nuclei, internal capsule, insula, M1-M3 cortex): 7 - Supraganglionic infarction (M4-M6 cortex): 3 Total score (0-10 with 10 being normal): 10  CTA NECK FINDINGS Aortic arch: Is mild calcified plaque in the aortic arch. The origins of the major branch vessels are patent with calcified plaque. The subclavian arteries are patent to the level imaged. Right carotid system: The right common carotid artery is patent with scattered calcified plaque but no hemodynamically significant stenosis or occlusion. There is calcified plaque at the bifurcation and  in the proximal internal carotid artery without hemodynamically significant stenosis or occlusion. There is no evidence of dissection or aneurysm. Left carotid system: The left common carotid artery is patent with scattered calcified plaque. The patient is status post left carotid endarterectomy. The internal carotid artery is widely patent. A small linear filling defect in the proximal external carotid artery is unchanged (7-199). There is no evidence of new dissection or aneurysm. Vertebral arteries: Moderate stenosis at the origin of the right vertebral artery is unchanged. The vertebral arteries are otherwise patent, without other hemodynamically significant stenosis or occlusion. There is no evidence of dissection or aneurysm. Skeleton: There is no acute osseous abnormality or suspicious osseous lesion. There is unchanged multilevel disc and facet degeneration throughout the cervical spine with mild anterolisthesis of C3 on C4 and C7 on T1. There is no visible canal hematoma. Other neck: The soft tissues of the neck are unremarkable. Upper chest: The imaged lung apices are clear. Review of the MIP images confirms the above findings CTA HEAD FINDINGS Anterior circulation: There is unchanged calcified plaque in the intracranial ICAs resulting in mild-to-moderate stenosis bilaterally. The bilateral MCAs and ACAS are patent, without proximal stenosis or occlusion. There is no aneurysm or AVM. Posterior circulation: There is calcified plaque in the non dominant right V4 segment resulting in mild-to-moderate stenosis. The right vertebral artery is diminutive but patent after the PICA origin. More bulky plaque in the left V4 segment resulting in mild-to-moderate stenosis is unchanged. The basilar artery is patent. The major cerebellar arteries appear patent. The bilateral PCAs are patent, without proximal stenosis or occlusion. A left posterior communicating artery is identified. There is no aneurysm or AVM. Venous  sinuses: Suboptimally evaluated due to bolus timing. Anatomic variants: None. Review of the MIP images confirms the above findings IMPRESSION: 1. Stable noncontrast head CT with no acute intracranial pathology. 2. No emergent vascular finding. 3. Calcified plaque in the right carotid system without hemodynamically significant stenosis. 4. Status post left carotid endarterectomy with widely patent internal carotid artery. A small linear filling defect in the proximal external carotid artery is unchanged. 5. Unchanged calcified plaque in the intracranial ICAs and V4 segments without high-grade stenosis or occlusion, and moderate stenosis at the right vertebral artery origin. Findings of the initial noncontrast head CT communicated to Dr Roda Shutters at 3:50 pm. CTA findings communicated at 3:59 pm. Electronically Signed   By: Lesia Hausen M.D.   On: 07/11/2023 16:08   CT ANGIO HEAD NECK W WO CM (CODE STROKE)  Result Date: 07/11/2023 CLINICAL DATA:  Code stroke. Left gaze, right-sided weakness. Responsive. EXAM: CT ANGIOGRAPHY HEAD AND NECK TECHNIQUE: Multidetector CT imaging of the head and neck was performed using the standard protocol during bolus administration of intravenous contrast. Multiplanar CT image reconstructions and MIPs were obtained to evaluate the vascular anatomy. Carotid stenosis measurements (when applicable) are obtained utilizing NASCET criteria, using the distal internal carotid diameter as the denominator. RADIATION DOSE REDUCTION: This exam was performed according to the departmental dose-optimization program which includes automated exposure control, adjustment of the mA and/or kV according to patient size and/or use of  iterative reconstruction technique. CONTRAST:  75mL OMNIPAQUE IOHEXOL 350 MG/ML SOLN COMPARISON:  CT head 06/30/2023, CTA head and neck 02/18/2023 FINDINGS: CT HEAD FINDINGS Brain: There is no acute intracranial hemorrhage, extra-axial fluid collection, or acute infarct There is  unchanged parenchymal volume loss with prominence of the ventricular system and extra-axial CSF spaces. The ventricles are stable in size. The remote cortical infarcts in the left frontal lobe posteriorly and right parietal lobe and small remote infarcts in the left corona radiata are unchanged. The pituitary and suprasellar region are normal. There is no mass lesion. There is no mass effect or midline shift. Vascular: See below. Skull: Normal. Negative for fracture or focal lesion. Sinuses/Orbits: There is mild mucosal thickening in the paranasal sinuses. The globes and orbits are unremarkable. Other: The mastoid air cells and middle ear cavities are clear. ASPECTS ([place] Stroke Program Early CT Score) - Ganglionic level infarction (caudate, lentiform nuclei, internal capsule, insula, M1-M3 cortex): 7 - Supraganglionic infarction (M4-M6 cortex): 3 Total score (0-10 with 10 being normal): 10 CTA NECK FINDINGS Aortic arch: Is mild calcified plaque in the aortic arch. The origins of the major branch vessels are patent with calcified plaque. The subclavian arteries are patent to the level imaged. Right carotid system: The right common carotid artery is patent with scattered calcified plaque but no hemodynamically significant stenosis or occlusion. There is calcified plaque at the bifurcation and in the proximal internal carotid artery without hemodynamically significant stenosis or occlusion. There is no evidence of dissection or aneurysm. Left carotid system: The left common carotid artery is patent with scattered calcified plaque. The patient is status post left carotid endarterectomy. The internal carotid artery is widely patent. A small linear filling defect in the proximal external carotid artery is unchanged (7-199). There is no evidence of new dissection or aneurysm. Vertebral arteries: Moderate stenosis at the origin of the right vertebral artery is unchanged. The vertebral arteries are otherwise patent,  without other hemodynamically significant stenosis or occlusion. There is no evidence of dissection or aneurysm. Skeleton: There is no acute osseous abnormality or suspicious osseous lesion. There is unchanged multilevel disc and facet degeneration throughout the cervical spine with mild anterolisthesis of C3 on C4 and C7 on T1. There is no visible canal hematoma. Other neck: The soft tissues of the neck are unremarkable. Upper chest: The imaged lung apices are clear. Review of the MIP images confirms the above findings CTA HEAD FINDINGS Anterior circulation: There is unchanged calcified plaque in the intracranial ICAs resulting in mild-to-moderate stenosis bilaterally. The bilateral MCAs and ACAS are patent, without proximal stenosis or occlusion. There is no aneurysm or AVM. Posterior circulation: There is calcified plaque in the non dominant right V4 segment resulting in mild-to-moderate stenosis. The right vertebral artery is diminutive but patent after the PICA origin. More bulky plaque in the left V4 segment resulting in mild-to-moderate stenosis is unchanged. The basilar artery is patent. The major cerebellar arteries appear patent. The bilateral PCAs are patent, without proximal stenosis or occlusion. A left posterior communicating artery is identified. There is no aneurysm or AVM. Venous sinuses: Suboptimally evaluated due to bolus timing. Anatomic variants: None. Review of the MIP images confirms the above findings IMPRESSION: 1. Stable noncontrast head CT with no acute intracranial pathology. 2. No emergent vascular finding. 3. Calcified plaque in the right carotid system without hemodynamically significant stenosis. 4. Status post left carotid endarterectomy with widely patent internal carotid artery. A small linear filling defect in the proximal external carotid  artery is unchanged. 5. Unchanged calcified plaque in the intracranial ICAs and V4 segments without high-grade stenosis or occlusion, and  moderate stenosis at the right vertebral artery origin. Findings of the initial noncontrast head CT communicated to Dr Roda Shutters at 3:50 pm. CTA findings communicated at 3:59 pm. Electronically Signed   By: Lesia Hausen M.D.   On: 07/11/2023 16:08    Pending Labs Unresulted Labs (From admission, onward)     Start     Ordered   07/11/23 1615  Blood culture (routine x 2)  BLOOD CULTURE X 2,   R      07/11/23 1614   07/11/23 1603  Urinalysis, Complete w Microscopic -Urine, Clean Catch  Once,   URGENT       Question:  Specimen Source  Answer:  Urine, Clean Catch   07/11/23 1603   Signed and Held  CBC  (enoxaparin (LOVENOX)    CrCl >/= 30 ml/min)  Once,   R       Comments: Baseline for enoxaparin therapy IF NOT ALREADY DRAWN.  Notify MD if PLT < 100 K.    Signed and Held   Signed and Held  Creatinine, serum  (enoxaparin (LOVENOX)    CrCl >/= 30 ml/min)  Once,   R       Comments: Baseline for enoxaparin therapy IF NOT ALREADY DRAWN.    Signed and Held   Signed and Held  Creatinine, serum  (enoxaparin (LOVENOX)    CrCl >/= 30 ml/min)  Weekly,   R     Comments: while on enoxaparin therapy    Signed and Held   Signed and Held  Comprehensive metabolic panel  Tomorrow morning,   R        Signed and Held   Signed and Held  CBC  Tomorrow morning,   R        Signed and Held   Signed and Held  Culture, blood (x 2)  BLOOD CULTURE X 2,   R     Comments: INITIATE ANTIBIOTICS WITHIN 1 HOUR AFTER BLOOD CULTURES DRAWN.  If unable to obtain blood cultures, call MD immediately regarding antibiotic instructions.    Signed and Held   Signed and Held  CBC with Differential  ONCE - STAT,   R        Signed and Held   Signed and Held  Comprehensive metabolic panel  ONCE - STAT,   R        Signed and Held   Signed and Held  Magnesium  Once,   R        Signed and Held   Signed and Held  Phosphorus  Once,   R        Signed and Held            Vitals/Pain Today's Vitals   07/11/23 2100 07/11/23 2230 07/11/23  2245 07/11/23 2330  BP: (!) 122/58 (!) 132/52  105/70  Pulse: (!) 56 (!) 55 60 61  Resp: 18 11 19 14   Temp:   (!) 95.9 F (35.5 C)   TempSrc:   Rectal   SpO2: 100% 100% 99% 100%  Weight:      Height:      PainSc:        Isolation Precautions No active isolations  Medications Medications  sodium chloride flush (NS) 0.9 % injection 3 mL (3 mLs Intravenous Not Given 07/11/23 1643)  0.9 %  sodium chloride infusion ( Intravenous New Bag/Given 07/11/23  2015)  acetaminophen (TYLENOL) tablet 1,000 mg (1,000 mg Oral Not Given 07/11/23 1808)  potassium chloride (KLOR-CON) packet 40 mEq (40 mEq Oral Not Given 07/11/23 2201)  levETIRAcetam (KEPPRA) 2,000 mg in sodium chloride 0.9 % 250 mL IVPB (0 mg Intravenous Stopped 07/11/23 2049)    Followed by  levETIRAcetam (KEPPRA) IVPB 500 mg/100 mL premix (has no administration in time range)  vancomycin (VANCOREADY) IVPB 1500 mg/300 mL (has no administration in time range)  vancomycin (VANCOREADY) IVPB 1250 mg/250 mL (has no administration in time range)  ceFEPIme (MAXIPIME) 2 g in sodium chloride 0.9 % 100 mL IVPB (2 g Intravenous New Bag/Given 07/11/23 2332)  iohexol (OMNIPAQUE) 350 MG/ML injection 75 mL (75 mLs Intravenous Contrast Given 07/11/23 1550)  sodium chloride 0.9 % bolus 500 mL (500 mLs Intravenous Bolus 07/11/23 1705)  lactated ringers bolus 1,000 mL (1,000 mLs Intravenous Bolus 07/11/23 1706)    Mobility non-ambulatory     Focused Assessments Cardiac Assessment Handoff:  Cardiac Rhythm: Sinus bradycardia Lab Results  Component Value Date   CKTOTAL 156 07/11/2023   CKMB 1.8 06/21/2020   TROPONINI 0.03 10/25/2014   No results found for: "DDIMER" Does the Patient currently have chest pain? No   , Neuro Assessment Handoff:  Swallow screen pass? No  Cardiac Rhythm: Sinus bradycardia NIH Stroke Scale  Dizziness Present: No Headache Present: No Interval: Shift assessment Level of Consciousness (1a.)   : Not alert, but arousable  by minor stimulation to obey, answer, or respond LOC Questions (1b. )   : Answers one question correctly LOC Commands (1c. )   : Performs neither task correctly Best Gaze (2. )  : Normal (unable to assess) Visual (3. )  : No visual loss (unable to assess) Facial Palsy (4. )    : Minor paralysis Motor Arm, Left (5a. )   : No drift Motor Arm, Right (5b. ) : No drift Motor Leg, Left (6a. )  : Drift Motor Leg, Right (6b. ) : No drift Limb Ataxia (7. ): Absent Sensory (8. )  : Normal, no sensory loss Best Language (9. )  : No aphasia Dysarthria (10. ): Mild-to-moderate dysarthria, patient slurs at least some words and, at worst, can be understood with some difficulty Extinction/Inattention (11.)   : Visual/tactile/auditory/spatial/personal inattention Complete NIHSS TOTAL: 8 Last date known well: 07/11/23 Last time known well: 1200 Neuro Assessment: Exceptions to WDL Neuro Checks:   Initial (07/11/23 1600)  Has TPA been given? No If patient is a Neuro Trauma and patient is going to OR before floor call report to 4N Charge nurse: [phone] or [phone]  , Pulmonary Assessment Handoff:  Lung sounds:   O2 Device: Nasal Cannula O2 Flow Rate (L/min): 2 L/min    R Recommendations: See Admitting Provider Note  Report given to:   Additional Notes: Patient was walking prior to today. Patient should be carefully monitored before attempting to walk once he has improved.

## 2023-07-12 NOTE — Progress Notes (Signed)
Neurology Progress Note  Brief HPI: 82 year old patient with history of strokes, seizures on Keppra, dementia, A-fib on Eliquis, hypertension, hyperlipidemia and left CEA in May of this year presented after being found down on his porch by a neighbor.  Core temperature on arrival was 102.  According to patient's brother, who visited earlier in the day, patient's caregiver had to leave him for an appointment and patient went outside and fell on the ground.  Patient recalls falling but is unable to say if he had a seizure or not and cannot recall if he missed any doses of his Keppra.  Patient's brother told RN that today he is back to his mental baseline.  Urinalysis was positive for UTI, and patient is being treated with antibiotics.    Subjective: Patient reports that he is doing well today, requests grape juice for breakfast  Exam: Vitals:   07/12/23 0423 07/12/23 0809  BP: (!) 95/50 123/71  Pulse: 65 71  Resp: 17 20  Temp: 97.8 F (36.6 C) 98.6 F (37 C)  SpO2: 97% 98%   Gen: In bed, NAD Resp: non-labored breathing, no acute distress Abd: soft, nt  Neuro: Mental Status: Alert and oriented to person and place but disoriented to time and situation, able to give some history of events yesterday Cranial Nerves: Pupils equal round and reactive, extraocular movements intact, facial sensation symmetrical, face symmetrical, phonation normal, hearing intact to voice, shoulder shrug symmetrical, tongue midline Motor: 5/5 strength in left upper extremity and bilateral lower extremities, 4+/5 strength in right upper extremity Sensory: Intact to light touch throughout Gait: Deferred  Pertinent Labs:    Latest Ref Rng & Units 07/12/2023    2:46 AM 07/11/2023    5:25 PM 07/11/2023    3:40 PM  CBC  WBC 4.0 - 10.5 K/uL 10.9  11.2    Hemoglobin 13.0 - 17.0 g/dL 9.2  9.8  9.9   Hematocrit 39.0 - 52.0 % 28.0  29.3  29.0   Platelets 150 - 400 K/uL 236  223         Latest Ref Rng & Units 07/12/2023     2:46 AM 07/11/2023    5:25 PM 07/11/2023    3:40 PM  BMP  Glucose 70 - 99 mg/dL 161  096  045   BUN 8 - 23 mg/dL 21  22  21    Creatinine 0.61 - 1.24 mg/dL 4.09  8.11  9.14   Sodium 135 - 145 mmol/L 136  134  138   Potassium 3.5 - 5.1 mmol/L 3.5  3.0  3.2   Chloride 98 - 111 mmol/L 110  108  109   CO2 22 - 32 mmol/L 17  15    Calcium 8.9 - 10.3 mg/dL 8.5  8.7     Urinalysis    Component Value Date/Time   COLORURINE YELLOW 07/12/2023 0825   APPEARANCEUR CLOUDY (A) 07/12/2023 0825   APPEARANCEUR Clear 06/12/2014 1029   LABSPEC 1.019 07/12/2023 0825   LABSPEC 1.005 06/12/2014 1029   PHURINE 5.0 07/12/2023 0825   GLUCOSEU NEGATIVE 07/12/2023 0825   GLUCOSEU Negative 06/12/2014 1029   HGBUR LARGE (A) 07/12/2023 0825   BILIRUBINUR NEGATIVE 07/12/2023 0825   BILIRUBINUR Negative 06/12/2014 1029   KETONESUR NEGATIVE 07/12/2023 0825   PROTEINUR 30 (A) 07/12/2023 0825   NITRITE POSITIVE (A) 07/12/2023 0825   LEUKOCYTESUR LARGE (A) 07/12/2023 0825   LEUKOCYTESUR Negative 06/12/2014 1029     Imaging Reviewed:  CT head: No acute abnormality  CTA head and neck: No LVO  MRI brain: No acute abnormality, chronic atrophy and microvascular ischemic changes  EEG 8/16: Intermittent slow generalized, suggestive of mild diffuse encephalopathy with no seizures or epileptiform discharges  Assessment: 82 year old patient with history of strokes, seizures on Keppra, dementia, A-fib on Eliquis, hypertension, hyperlipidemia and left CEA in May of this year presented after being found down on his porch by a neighbor.  He was hypothermic on arrival and was noted to have an acute kidney injury.  UA was positive for UTI.  Patient does remember falling on his porch but is unsure whether he had a seizure and also unsure whether he has missed any doses of his Keppra recently.  EEG was negative for current seizure activity though.  MRI brain showed no stroke.  Per patient's brother, he is back to his mental  baseline today.  Suspect that patient's presentation was due to toxic metabolic encephalopathy from UTI and heat exhaustion related to falling on the porch and being outside in the sun for several hours.  Impression: Toxic metabolic encephalopathy in the setting of UTI and heat exhaustion  Recommendations: -Treatment of infection per primary team -Delirium precautions -Neurology will sign off, please call with questions or concerns   E Ernestina Columbia , MSN, AGACNP-BC [facility] See Amion for schedule and pager information 07/12/2023 10:06 AM

## 2023-07-12 NOTE — Evaluation (Signed)
Clinical/Bedside Swallow Evaluation Patient Details  Name: Richard Carter MRN: [id number] Date of Birth: 06-May-1941  Today's Date: 07/12/2023 Time: SLP Start Time (ACUTE ONLY): 1105 SLP Stop Time (ACUTE ONLY): 1120 SLP Time Calculation (min) (ACUTE ONLY): 15 min  Past Medical History:  Past Medical History:  Diagnosis Date   Alcohol abuse    Dementia (HCC)    GERD (gastroesophageal reflux disease)    HLD (hyperlipidemia)    HTN (hypertension)    Seizures (HCC)    Stroke [facility])    Past Surgical History:  Past Surgical History:  Procedure Laterality Date   ENDARTERECTOMY Left 04/02/2023   Procedure: ENDARTERECTOMY CAROTID;  Surgeon: Renford Dills, MD;  Location: [facility] ORS;  Service: Vascular;  Laterality: Left;   skin cyst removal     HPI:  82 y.o. male presents to the emergency department 8/16 for evaluation of code stroke.  Patient was reportedly found altered and down on his front porch lying in the sun. MRI negative for acute stroke.  PMHx: remote alcohol abuse, dementia, GERD, hypertension, hyperlipidemia, seizures, ischemic CVA, atrial fibrillation on Eliquis.    Assessment / Plan / Recommendation  Clinical Impression  Patient presents with s/s of decreased airway protection following intake of both thin liquids and regular texture solids characterized by cough post swallow. No s/s of aspiration present with pureed solids or nectar thick liquids. Discussed patient's swallowing history with caregiver [name] (per recommendation from patient's brother Rocky Link). Per [name], patient did consume a pureed, nectar thick liquid diet at home prior to admission per most recent SLP recommendations and is followed up by [facility] SLP services. [name] stated that coughing remains present if bolus size too large or rate of intake is too fast but otherwise appears to be tolerating diet. Current CXR negative. Plan to downgrade diet back to dysphagia 1 with nectar thick liquid and follow along for tolerance and  potential repeat instrumental exam to determine acute needs and needs after d/c as most recent MBS was complete 03/2023 s/p acute CVA. SLP Visit Diagnosis: Dysphagia, oropharyngeal phase (R13.12)    Aspiration Risk  Moderate aspiration risk    Diet Recommendation Dysphagia 1 (Puree);Nectar-thick liquid    Liquid Administration via: Cup;No straw Medication Administration: Crushed with puree Supervision: Patient able to self feed;Full supervision/cueing for compensatory strategies Compensations: Small sips/bites;Slow rate Postural Changes: Seated upright at 90 degrees    Other  Recommendations Oral Care Recommendations: Oral care BID Caregiver Recommendations: Avoid jello, ice cream, thin soups, popsicles;Remove water pitcher    Recommendations for follow up therapy are one component of a multi-disciplinary discharge planning process, led by the attending physician.  Recommendations may be updated based on patient status, additional functional criteria and insurance authorization.  Follow up Recommendations Home health SLP            Frequency and Duration min 2x/week  2 weeks       Prognosis Prognosis for improved oropharyngeal function: Fair Barriers to Reach Goals: Time post onset      Swallow Study   General HPI: 82 y.o. male presents to the emergency department 8/16 for evaluation of code stroke.  Patient was reportedly found altered and down on his front porch lying in the sun. MRI negative for acute stroke.  PMHx: remote alcohol abuse, dementia, GERD, hypertension, hyperlipidemia, seizures, ischemic CVA, atrial fibrillation on Eliquis. Type of Study: Bedside Swallow Evaluation Previous Swallow Assessment: last seen 05/2023, recommendations for dysphagia 1 with nectar thick liquid per recommendations from 04/01/23 MBS.  Diet Prior to this Study: Regular;Thin liquids (Level 0) Temperature Spikes Noted: No Respiratory Status: Room air History of Recent Intubation:  No Behavior/Cognition: Alert;Cooperative;Pleasant mood;Confused Oral Cavity Assessment: Within Functional Limits Oral Care Completed by SLP: No Oral Cavity - Dentition: Dentures, top;Missing dentition Vision: Functional for self-feeding Self-Feeding Abilities: Able to feed self Patient Positioning: Upright in chair Baseline Vocal Quality: Normal Volitional Cough: Congested Volitional Swallow: Able to elicit    Oral/Motor/Sensory Function Overall Oral Motor/Sensory Function: Within functional limits   Ice Chips Ice chips: Not tested   Thin Liquid Thin Liquid: Impaired Presentation: Cup;Self Fed Pharyngeal  Phase Impairments: Cough - Immediate    Nectar Thick Nectar Thick Liquid: Within functional limits Presentation: Cup;Self Fed   Honey Thick Honey Thick Liquid: Not tested   Puree Puree: Within functional limits Presentation: Spoon   Solid     Solid: Impaired Presentation: Self Fed Pharyngeal Phase Impairments: Cough - Delayed;Cough - Immediate     Maybelline Kolarik MA, CCC-SLP  Flavio Lindroth Meryl 07/12/2023,11:40 AM

## 2023-07-12 NOTE — Evaluation (Signed)
Occupational Therapy Evaluation Patient Details Name: Richard Carter MRN: [id number] DOB: 11/20/1941 Today's Date: 07/12/2023   History of Present Illness 82 y.o. male presents to the emergency department 8/16 for evaluation of code stroke.  Patient was reportedly found altered and down on his front porch lying in the sun. PMHx: remote alcohol abuse, dementia, GERD, hypertension, hyperlipidemia, seizures, ischemic CVA, atrial fibrillation on Eliquis.   Clinical Impression   PTA, pt reports independent with ADL and mobility within the home, however, per chart, pt required assist with mobility and ADL. Upon eval, pt requiring min-mod A for ADL and min A for transfers with RW. Pt with RUE weakness and pain as well as decreased mobility that he reports as "new". RN aware. Pt with poor visual attention needing cues to locate water on tray table. If pt has 24/7 care at home consistent with information in chart, will benefit from [facility]. If family/friends unable to provide 24/7 recommend inpatient rehab <3 hours/day.       If plan is discharge home, recommend the following: A little help with walking and/or transfers;A lot of help with bathing/dressing/bathroom;Assistance with cooking/housework;Help with stairs or ramp for entrance;Assist for transportation;Direct supervision/assist for financial management;Assistance with feeding;Direct supervision/assist for medications management    Functional Status Assessment  Patient has had a recent decline in their functional status and demonstrates the ability to make significant improvements in function in a reasonable and predictable amount of time.  Equipment Recommendations  None recommended by OT    Recommendations for Other Services       Precautions / Restrictions Precautions Precautions: Fall Restrictions Weight Bearing Restrictions: No      Mobility Bed Mobility Overal bed mobility: Needs Assistance             General bed mobility  comments: up in recliner on arrival and departure    Transfers Overall transfer level: Needs assistance Equipment used: Rolling walker (2 wheels) Transfers: Sit to/from Stand Sit to Stand: Min assist           General transfer comment: min assist for boost to stand.      Balance Overall balance assessment: Needs assistance Sitting-balance support: No upper extremity supported, Feet supported Sitting balance-Leahy Scale: Fair     Standing balance support: Bilateral upper extremity supported, Reliant on assistive device for balance Standing balance-Leahy Scale: Poor                             ADL either performed or assessed with clinical judgement   ADL Overall ADL's : Needs assistance/impaired Eating/Feeding: Set up;Sitting;Cueing for sequencing   Grooming: Set up;Sitting;Cueing for sequencing   Upper Body Bathing: Set up;Sitting   Lower Body Bathing: Minimal assistance;Sit to/from stand   Upper Body Dressing : Sitting;Minimal assistance   Lower Body Dressing: Moderate assistance;Sit to/from stand   Toilet Transfer: Minimal assistance;Rolling walker (2 wheels)           Functional mobility during ADLs: Minimal assistance;Rolling walker (2 wheels)       Vision Patient Visual Report: No change from baseline Additional Comments: asking for water but water with thickening on tray in front of pt?     Perception         Praxis         Pertinent Vitals/Pain Pain Assessment Pain Assessment: No/denies pain     Extremity/Trunk Assessment Upper Extremity Assessment Upper Extremity Assessment: Generalized weakness;RUE deficits/detail RUE Deficits / Details: only  able to flex shoulder ~20 degrees reporting it hurts from hitting it when he fell   Lower Extremity Assessment Lower Extremity Assessment: Defer to PT evaluation       Communication Communication Communication: Difficulty communicating thoughts/reduced clarity of speech Cueing  Techniques: Verbal cues   Cognition Arousal: Alert Behavior During Therapy: WFL for tasks assessed/performed Overall Cognitive Status: History of cognitive impairments - at baseline (per pt brother reported essentially back to baseline)                                       General Comments       Exercises     Shoulder Instructions      Home Living Family/patient expects to be discharged to:: Private residence Living Arrangements: Alone Available Help at Discharge: Available 24 hours/day;Other (Comment) ("Richard Carter" --this is per chart; per pt, Richard Carter not available 24/7) Type of Home: Mobile home Home Access: Stairs to enter [employer] of Steps: 2 Entrance Stairs-Rails: Left Home Layout: One level     Bathroom Shower/Tub: Producer, television/film/video: Handicapped height     Home Equipment: Grab bars - tub/shower;Shower seat;Grab bars - Geophysicist/field seismologist (2 wheels)   Additional Comments: Home set up obtained from medical record      Prior Functioning/Environment Prior Level of Function : Needs assist;History of Falls (last six months)             Mobility Comments: Reports he uses RW to mobilize and someone assists. Not supposed to go out of house alone, but he did and fell. ADLs Comments: Reports Richard Carter helps with all ADLs.        OT Problem List: Decreased strength;Impaired balance (sitting and/or standing);Decreased activity tolerance;Decreased cognition;Decreased safety awareness;Decreased knowledge of use of DME or AE      OT Treatment/Interventions: Self-care/ADL training;Therapeutic exercise;DME and/or AE instruction;Balance training;Patient/family education;Cognitive remediation/compensation;Therapeutic activities    OT Goals(Current goals can be found in the care plan section) Acute Rehab OT Goals Patient Stated Goal: go home OT Goal Formulation: With patient Time For Goal Achievement: 07/26/23 Potential to  Achieve Goals: Good  OT Frequency: Min 2X/week    Co-evaluation              AM-PAC OT "6 Clicks" Daily Activity     Outcome Measure Help from another person eating meals?: A Little Help from another person taking care of personal grooming?: A Little Help from another person toileting, which includes using toliet, bedpan, or urinal?: A Lot Help from another person bathing (including washing, rinsing, drying)?: A Lot Help from another person to put on and taking off regular upper body clothing?: A Lot Help from another person to put on and taking off regular lower body clothing?: A Little 6 Click Score: 15   End of Session Equipment Utilized During Treatment: Gait belt;Rolling walker (2 wheels) Nurse Communication: Mobility status  Activity Tolerance: Patient tolerated treatment well Patient left: in chair;with call bell/phone within reach;with chair alarm set  OT Visit Diagnosis: Unsteadiness on feet (R26.81);Muscle weakness (generalized) (M62.81);History of falling (Z91.81);Other symptoms and signs involving cognitive function                Time: 2440-1027 OT Time Calculation (min): 16 min Charges:  OT General Charges $OT Visit: 1 Visit OT Evaluation $OT Eval Moderate Complexity: 1 Mod  Tyler Deis, OTR/L [facility] Acute Rehabilitation Office: [phone]  Myrla Halsted 07/12/2023, 6:03 PM

## 2023-07-12 NOTE — Evaluation (Signed)
Physical Therapy Evaluation Patient Details Name: Richard Carter MRN: [id number] DOB: September 11, 1941 Today's Date: 07/12/2023  History of Present Illness  82 y.o. male presents to the emergency department 8/16 for evaluation of code stroke.  Patient was reportedly found altered and down on his front porch lying in the sun. PMHx: remote alcohol abuse, dementia, GERD, hypertension, hyperlipidemia, seizures, ischemic CVA, atrial fibrillation on Eliquis.  Clinical Impression  Pt admitted with above diagnosis. PTA pt required some assist with gait and all ADLs. Reports he has 24/7 care, however pt fell while alone at home after attempting to walk outside. Brother present today to assist with history. [name] is POA, not present, reportedly stays with pt. Brother feels pt near baseline. Currently requires min assist for bed mobility, transfer, and gait short distances in room. Suspect HHPT will be appropriate at d/c if family can continue to provide 24/7 care and arrange for supervision when primary caregiver needs to leave pt unattended. Pt currently with functional limitations due to the deficits listed below (see PT Problem List). Pt will benefit from acute skilled PT to increase their independence and safety with mobility to allow discharge.           If plan is discharge home, recommend the following: A little help with walking and/or transfers;A little help with bathing/dressing/bathroom;Assistance with cooking/housework;Direct supervision/assist for medications management;Direct supervision/assist for financial management;Assist for transportation;Help with stairs or ramp for entrance;Supervision due to cognitive status   Can travel by private vehicle        Equipment Recommendations None recommended by PT  Recommendations for Other Services       Functional Status Assessment Patient has had a recent decline in their functional status and demonstrates the ability to make significant improvements  in function in a reasonable and predictable amount of time.     Precautions / Restrictions Precautions Precautions: Fall Restrictions Weight Bearing Restrictions: No      Mobility  Bed Mobility Overal bed mobility: Needs Assistance Bed Mobility: Supine to Sit     Supine to sit: Min assist     General bed mobility comments: Min assist to pull through therapist hand and to assist with scoot to EOB. VC throughout    Transfers Overall transfer level: Needs assistance Equipment used: Rolling walker (2 wheels) Transfers: Sit to/from Stand Sit to Stand: Min assist           General transfer comment: min assist for boost to stand, leaning posteriorly, bracing back of legs on bed for support, cues for forward weight shift, effortful reach for RW due to weakness of RUE.    Ambulation/Gait Ambulation/Gait assistance: Min assist Gait Distance (Feet): 20 Feet Assistive device: Rolling walker (2 wheels) Gait Pattern/deviations: Step-through pattern, Decreased stride length, Shuffle, Leaning posteriorly Gait velocity: decr Gait velocity interpretation: <1.31 ft/sec, indicative of household ambulator   General Gait Details: Educated on safe AD use and proximity to walker for support. Initially with Min assist, progressed to CGA in room. Slow but without overt buckling. Leaning posteriorly at first.  Careers information officer     Tilt Bed    Modified Rankin (Stroke Patients Only)       Balance Overall balance assessment: Needs assistance Sitting-balance support: No upper extremity supported, Feet supported Sitting balance-Leahy Scale: Fair     Standing balance support: Bilateral upper extremity supported, Reliant on assistive device for balance Standing balance-Leahy Scale: Poor  Pertinent Vitals/Pain Pain Assessment Pain Assessment: No/denies pain    Home Living Family/patient expects to be discharged  to:: Private residence Living Arrangements: Alone Available Help at Discharge: Available 24 hours/day;Other (Comment) [name]") Type of Home: Mobile home Home Access: Stairs to enter Entrance Stairs-Rails: Left Entrance Stairs-Number of Steps: 2   Home Layout: One level Home Equipment: Grab bars - tub/shower;Shower seat;Grab bars - Geophysicist/field seismologist (2 wheels)      Prior Function Prior Level of Function : Needs assist;History of Falls (last six months)             Mobility Comments: Reports he uses RW to mobilize and someone assists. Not supposed to go out of house alone, but he did and fell. ADLs Comments: Reports [name] helps with all ADLs.     Extremity/Trunk Assessment   Upper Extremity Assessment Upper Extremity Assessment: Defer to OT evaluation    Lower Extremity Assessment Lower Extremity Assessment: Generalized weakness;Difficult to assess due to impaired cognition       Communication   Communication Communication: Difficulty communicating thoughts/reduced clarity of speech Cueing Techniques: Verbal cues;Tactile cues  Cognition Arousal: Alert Behavior During Therapy: WFL for tasks assessed/performed Overall Cognitive Status: History of cognitive impairments - at baseline (Per brother, essentially back to baseline.)                                          General Comments General comments (skin integrity, edema, etc.): Brother present during evaluation, reports near baseline, hopeful pt will return home soon.    Exercises     Assessment/Plan    PT Assessment Patient needs continued PT services  PT Problem List Decreased strength;Decreased range of motion;Decreased activity tolerance;Decreased balance;Decreased mobility;Decreased coordination;Decreased cognition;Decreased knowledge of use of DME;Decreased safety awareness;Decreased knowledge of precautions;Impaired tone       PT Treatment Interventions DME instruction;Gait  training;Stair training;Functional mobility training;Therapeutic activities;Therapeutic exercise;Balance training;Neuromuscular re-education;Patient/family education;Wheelchair mobility training    PT Goals (Current goals can be found in the Care Plan section)  Acute Rehab PT Goals Patient Stated Goal: go home PT Goal Formulation: With patient/family Time For Goal Achievement: 07/26/23 Potential to Achieve Goals: Good    Frequency Min 1X/week     Co-evaluation               AM-PAC PT "6 Clicks" Mobility  Outcome Measure Help needed turning from your back to your side while in a flat bed without using bedrails?: None Help needed moving from lying on your back to sitting on the side of a flat bed without using bedrails?: A Little Help needed moving to and from a bed to a chair (including a wheelchair)?: A Little Help needed standing up from a chair using your arms (e.g., wheelchair or bedside chair)?: A Little Help needed to walk in hospital room?: A Little Help needed climbing 3-5 steps with a railing? : A Lot 6 Click Score: 18    End of Session Equipment Utilized During Treatment: Gait belt Activity Tolerance: Patient tolerated treatment well Patient left: in chair;with call bell/phone within reach;with chair alarm set;with SCD's reapplied Nurse Communication: Mobility status PT Visit Diagnosis: Unsteadiness on feet (R26.81);Other abnormalities of gait and mobility (R26.89);Muscle weakness (generalized) (M62.81);History of falling (Z91.81);Difficulty in walking, not elsewhere classified (R26.2);Other symptoms and signs involving the nervous system (U98.119)    Time: 1478-2956 PT Time Calculation (min) (ACUTE ONLY): 17 min  Charges:   PT Evaluation $PT Eval Low Complexity: 1 Low   PT General Charges $$ ACUTE PT VISIT: 1 Visit         Kathlyn Sacramento, PT, DPT [facility]  Rehabilitation Services Physical Therapist Office: [phone] Website:  [email or web address]   Berton Mount 07/12/2023, 10:32 AM

## 2023-07-12 NOTE — Progress Notes (Signed)
PROGRESS NOTE    Richard Carter  ZOX:[id number] DOB: 05-27-1941 DOA: 07/11/2023 PCP: Marisue Ivan, MD    Brief Narrative:   Richard Carter is a 82 y.o. male with past medical history significant for CVA, seizure disorder, mixed Alzheimer's and vascular dementia, persistent atrial fibrillation on Eliquis, essential hypertension, hyperlipidemia, carotid artery stenosis s/p left CEA, remote EtOH abuse, CKD stage IIIa, COPD, tobacco use disorder, medical noncompliance who presented to [facility] ED on 8/16 after being found down approximately 3 PM for unknown amount of time by his neighbor.  Last known well at 12 PM same day.  Patient was laying in the sun altered/unconscious.  EMS was called and patient was brought to the ED for further evaluation.  On arrival to the ED, patient was noted to be disoriented, garbled speech with mild right-sided weakness.  Initially on 10 L nonrebreather via EMS but was quickly titrated down.  Code stroke was initiated.  Temperature 102.1 F, HR 89, RR 26, BP 105/56, SpO2 94% on 4 L nasal cannula.  Sodium 138, potassium 3.2, chloride 109, glucose 150, BUN 21, creatinine 2.10.  Ionized calcium 1.21.  Hemoglobin 9.9.  EtOH level less than 10.  TSH 1.844.  INR 2.4.  Vitamin B12 671, lactic acid 1.0.  Chest x-ray with no acute cardiopulmonary disease findings.  COVID/flu/RSV PCR negative.  CT head without contrast with no acute intracranial abnormality.  CT angiogram head/neck with no emergent vascular finding, noted left carotid endarterectomy with widely patent internal carotid artery.  MRI brain without contrast with no acute reversible finding, prominent atrophy and chronic ischemic injury.  Neurology was consulted, patient was given loading dose of IV Keppra 2 g.  TRH consulted for admission for further evaluation and management of acute metabolic encephalopathy concerning for breakthrough seizure, urinary tract infection.  Assessment & Plan:   Acute  metabolic encephalopathy Hx seizure disorder with concern for breakthrough seizure Patient presenting to ED after being found down by a bystander for unknown known amount of time.  Patient was found on the ground on the front porch under direct sunshine unconscious.  Patient with known history of seizure disorder, apparently noncompliant with Keppra.  Patient is noted to be febrile, although laying in the sun for likely several hours.  Potassium slightly low, EtOH level less than 10, TSH, vitamin B12 within normal limits.  Lactic acid within normal limits.  Chest x-ray unrevealing.  CT head, CT angiogram head/neck and MRI brain with no acute findings.  Patient was given 2 g IV Keppra loading dose.  EEG with mild diffuse encephalopathy no seizures or epileptiform discharges noted. -- Neurology following, appreciate assistance -- Keppra 500mg  BID -- PT/OT/SLP evaluation  Hypokalemia Potassium 3.2 on admission, repleted.  3.5 this morning. -- Repeat electrolytes in the a.m. to include magnesium  Urinary tract infection Hx E. coli UTI Patient with history of multiple E. coli UTIs in the past on EMR review.  Initially started on empiric antibiotics with vancomycin, cefepime and metronidazole.  Previous urine cultures with susceptibility to ceftriaxone.  Urinalysis with large leukocytes, positive nitrite, rare bacteria, greater than 50 WBCs. -- Urine culture: Pending; although obtained after initiation of IV antibiotics --Blood cultures x 2: Pending -- De-escalate antibiotics to ceftriaxone 1 g IV every 24 hours  Hyperthermia likely secondary to environmental exposure Temperature 102.1 F on ED arrival, likely secondary to environmental exposure as being found down outside under the sun/heat.  Temperature has now normalized.  Hx CVA CT head,  MRI brain negative for acute finding. -- Atorvastatin 20 mg p.o. daily -- Eliquis 5 mg p.o. twice daily  Mixed Alzheimer's and vascular dementia --Delirium  precautions --Get up during the day --Encourage a familiar face to remain present throughout the day --Keep blinds open and lights on during daylight hours --Minimize the use of opioids/benzodiazepines  Persistent atrial fibrillation Essential hypertension Blood pressure 95/50 this morning, continue to hold home amlodipine. -- Eliquis 5mg  PO BID -- Monitor on telemetry  Hyperlipidemia -- Atorvastatin 20 mg p.o. daily  Hx carotid artery stenosis  s/p left CEA  CKD stage IIIa Creatinine 1.90, stable -- Avoid nephrotoxins, renally dose all medications -- BMP in the a.m.  COPD Not oxygen dependent at baseline, currently not utilizing any controlling inhalers at home. -- Albuterol neb every 6 hours as needed wheezing/shortness of breath  BPH: -- Tamsulosin 0.4 mg p.o. daily  Remote EtOH abuse Currently denies  Medical noncompliance Complicates all facets of care  DVT prophylaxis: SCDs Start: 07/12/23 0159 apixaban (ELIQUIS) tablet 5 mg    Code Status: DNR Family Communication: No family present at bedside this morning  Disposition Plan:  Level of care: Telemetry Medical Status is: Inpatient Remains inpatient appropriate because: IV antibiotics, further workup of encephalopathy likely concerning for breakthrough seizure    Consultants:  Neurology  Procedures:  EEG  Antimicrobials:  Vancomycin 8/16 - 8/16 Cefepime 8/16 - 8/16 Metronidazole 8/16 - 8/16 Ceftriaxone 8/17>>   Subjective: Patient seen examined bedside, resting comfortably.  Asking for something to eat and drink.  Appears he is back to his baseline mental status.  Discussed concern for likely breakthrough seizures, likely in the setting of medication noncompliance is not taking Keppra outpatient.  Also concern for UTI, reviewed EMR and previous urine cultures with susceptibility to ceftriaxone with E. coli; will de-escalate antibiotics to ceftriaxone as well.  Awaiting therapy evaluation.  Further  recommendations from neurology.  No other specific questions or concerns at this time.  Denies headache, no visual changes, no chest pain, no palpitations, no shortness of breath, no cough/congestion, no fever/chills/night sweats, no nausea/vomiting/diarrhea, no focal weakness, no fatigue, no paresthesias.  No acute events overnight per nursing staff.  Objective: Vitals:   07/12/23 0115 07/12/23 0205 07/12/23 0423 07/12/23 0809  BP:  (!) 128/94 (!) 95/50 123/71  Pulse: (!) 50 64 65 71  Resp: 16 18 17 20   Temp:  (!) 97.5 F (36.4 C) 97.8 F (36.6 C) 98.6 F (37 C)  TempSrc:  Oral Oral Oral  SpO2: 100% 95% 97% 98%  Weight:  65.5 kg    Height:  5\' 7"  (1.702 m)      Intake/Output Summary (Last 24 hours) at 07/12/2023 1046 Last data filed at 07/12/2023 7846 Gross per 24 hour  Intake 1707.77 ml  Output --  Net 1707.77 ml   Filed Weights   07/11/23 1500 07/11/23 1555 07/12/23 0205  Weight: 65.2 kg 65.2 kg 65.5 kg    Examination:  Physical Exam: GEN: NAD, alert and oriented x 3, chronically ill in appearance, appears older than stated age HEENT: NCAT, PERRL, EOMI, sclera clear, dry mucous membranes PULM: CTAB w/o wheezes/crackles, normal respiratory effort, on room air CV: RRR w/o M/G/R GI: abd soft, NTND, NABS, no R/G/M MSK: no peripheral edema, muscle strength globally intact 5/5 bilateral upper/lower extremities NEURO: CN II-XII intact, no focal deficits, sensation to light touch intact PSYCH: Depressed mood, flat affect    Data Reviewed: I have personally reviewed following labs and imaging  studies  CBC: Recent Labs  Lab 07/11/23 1540 07/11/23 1725 07/12/23 0246  WBC  --  11.2* 10.9*  NEUTROABS  --  9.4* 8.4*  HGB 9.9* 9.8* 9.2*  HCT 29.0* 29.3* 28.0*  MCV  --  90.4 88.6  PLT  --  223 236   Basic Metabolic Panel: Recent Labs  Lab 07/11/23 1540 07/11/23 1725 07/12/23 0246  NA 138 134* 136  K 3.2* 3.0* 3.5  CL 109 108 110  CO2  --  15* 17*  GLUCOSE 150*  138* 103*  BUN 21 22 21   CREATININE 2.10* 2.01* 1.90*  CALCIUM  --  8.7* 8.5*  MG  --   --  1.3*  PHOS  --   --  2.9   GFR: Estimated Creatinine Clearance: 27.3 mL/min (A) (by C-G formula based on SCr of 1.9 mg/dL (H)). Liver Function Tests: Recent Labs  Lab 07/11/23 1725 07/12/23 0246  AST 16 73*  ALT 13 21  ALKPHOS 71 68  BILITOT 0.6 0.3  PROT 6.3* 6.3*  ALBUMIN 2.8* 2.9*   No results for input(s): "LIPASE", "AMYLASE" in the last 168 hours. Recent Labs  Lab 07/11/23 1704  AMMONIA 20   Coagulation Profile: Recent Labs  Lab 07/11/23 1725  INR 2.4*   Cardiac Enzymes: Recent Labs  Lab 07/11/23 1725  CKTOTAL 156   BNP (last 3 results) No results for input(s): "PROBNP" in the last 8760 hours. HbA1C: No results for input(s): "HGBA1C" in the last 72 hours. CBG: Recent Labs  Lab 07/11/23 1535  GLUCAP 130*   Lipid Profile: No results for input(s): "CHOL", "HDL", "LDLCALC", "TRIG", "CHOLHDL", "LDLDIRECT" in the last 72 hours. Thyroid Function Tests: Recent Labs    07/11/23 1726  TSH 1.844   Anemia Panel: Recent Labs    07/11/23 1726  VITAMINB12 671   Sepsis Labs: Recent Labs  Lab 07/12/23 0456  LATICACIDVEN 1.0    Recent Results (from the past 240 hour(s))  Blood culture (routine x 2)     Status: None (Preliminary result)   Collection Time: 07/11/23  5:04 PM   Specimen: BLOOD  Result Value Ref Range Status   Specimen Description BLOOD SITE NOT SPECIFIED  Final   Special Requests   Final    BOTTLES DRAWN AEROBIC AND ANAEROBIC Blood Culture adequate volume   Culture   Final    NO GROWTH < 12 HOURS Performed at [facility], [address] [place], [place] [address]    Report Status PENDING  Incomplete  Blood culture (routine x 2)     Status: None (Preliminary result)   Collection Time: 07/11/23  5:11 PM   Specimen: BLOOD  Result Value Ref Range Status   Specimen Description BLOOD SITE NOT SPECIFIED  Final   Special Requests   Final     BOTTLES DRAWN AEROBIC AND ANAEROBIC Blood Culture adequate volume   Culture   Final    NO GROWTH < 12 HOURS Performed at [facility], [address], [place], [place] [address]    Report Status PENDING  Incomplete  Resp panel by RT-PCR (RSV, Flu A&B, Covid) Anterior Nasal Swab     Status: None   Collection Time: 07/11/23  8:25 PM   Specimen: Anterior Nasal Swab  Result Value Ref Range Status   SARS Coronavirus 2 by RT PCR NEGATIVE NEGATIVE Final   Influenza A by PCR NEGATIVE NEGATIVE Final   Influenza B by PCR NEGATIVE NEGATIVE Final  Comment: (NOTE) The Xpert Xpress SARS-CoV-2/FLU/RSV plus assay is intended as an aid in the diagnosis of influenza from Nasopharyngeal swab specimens and should not be used as a sole basis for treatment. Nasal washings and aspirates are unacceptable for Xpert Xpress SARS-CoV-2/FLU/RSV testing.  Fact Sheet for Patients: BloggerCourse.com  Fact Sheet for Healthcare Providers: SeriousBroker.it  This test is not yet approved or cleared by the [place] FDA and has been authorized for detection and/or diagnosis of SARS-CoV-2 by FDA under an Emergency Use Authorization (EUA). This EUA will remain in effect (meaning this test can be used) for the duration of the COVID-19 declaration under Section 564(b)(1) of the Act, 21 U.S.C. section 360bbb-3(b)(1), unless the authorization is terminated or revoked.     Resp Syncytial Virus by PCR NEGATIVE NEGATIVE Final    Comment: (NOTE) Fact Sheet for Patients: BloggerCourse.com  Fact Sheet for Healthcare Providers: SeriousBroker.it  This test is not yet approved or cleared by the [place] FDA and has been authorized for detection and/or diagnosis of SARS-CoV-2 by FDA under an Emergency Use Authorization (EUA). This EUA will remain in effect (meaning this test can be used) for the duration  of the COVID-19 declaration under Section 564(b)(1) of the Act, 21 U.S.C. section 360bbb-3(b)(1), unless the authorization is terminated or revoked.  Performed at [facility], [address] [place], [place] [address]   MRSA Next Gen by PCR, Nasal     Status: None   Collection Time: 07/12/23  7:00 AM   Specimen: Nasal Mucosa; Nasal Swab  Result Value Ref Range Status   MRSA by PCR Next Gen NOT DETECTED NOT DETECTED Final    Comment: (NOTE) The GeneXpert MRSA Assay (FDA approved for NASAL specimens only), is one component of a comprehensive MRSA colonization surveillance program. It is not intended to diagnose MRSA infection nor to guide or monitor treatment for MRSA infections. Test performance is not FDA approved in patients less than 59 years old. Performed at [facility], [address] [place], [place] [address]          Radiology Studies: MR BRAIN WO CONTRAST  Result Date: 07/12/2023 CLINICAL DATA:  Stroke follow-up EXAM: MRI HEAD WITHOUT CONTRAST TECHNIQUE: Multiplanar, multiecho pulse sequences of the brain and surrounding structures were obtained without intravenous contrast. COMPARISON:  Head CT from yesterday FINDINGS: Brain: No acute infarction, hemorrhage, hydrocephalus, extra-axial collection or mass lesion. Chronic superior left frontal infarct which is moderate to large. Moderate chronic right parietal cortex infarct. Small chronic right occipital cortex infarct. Ischemic gliosis in the cerebral white matter. Chronic lacunar infarct in the right paramedian pons. Pronounced brain atrophy. There are some chronic microhemorrhages primarily in the deep brain and likely ischemic. Vascular: Grossly preserved major flow voids Skull and upper cervical spine: No focal marrow lesion. Facet degeneration with C3-4 anterolisthesis. Sinuses/Orbits: Negative IMPRESSION: 1. No acute or reversible finding. 2. Prominent atrophy and chronic ischemic injury. Electronically  Signed   By: Tiburcio Pea M.D.   On: 07/12/2023 06:28   DG CHEST PORT 1 VIEW  Result Date: 07/11/2023 CLINICAL DATA:  Respiratory distress chest EXAM: PORTABLE CHEST 1 VIEW COMPARISON:  Chest x-ray dated June 30, 2023 FINDINGS: Unchanged cardiac and mediastinal contours. Widening of the right paratracheal region, unchanged when compared with the prior pleural priors and likely due to goiter or vascular tortuosity. Moderate hiatal hernia. No focal consolidation. No evidence of pleural effusion or pneumothorax. Old left-sided rib fractures. IMPRESSION: 1. No acute cardiopulmonary process.  2. Moderate hiatal hernia. Electronically Signed   By: Allegra Lai M.D.   On: 07/11/2023 16:39   EEG adult  Result Date: 07/11/2023 Charlsie Quest, MD     07/11/2023  8:12 PM Patient Name: Richard Carter MRN: [id number] Epilepsy Attending: Charlsie Quest Referring Physician/Provider: Marjorie Smolder, NP Date: 07/11/2023 Duration: 23.36 mins Patient history: 82yo M with left gaze deviation and right sided weakness getting eeg to evaluate for seizure. Level of alertness: Awake, asleep AEDs during EEG study: None Technical aspects: This EEG study was done with scalp electrodes positioned according to the 10-20 International system of electrode placement. Electrical activity was reviewed with band pass filter of 1-70Hz , sensitivity of 7 uV/mm, display speed of 53mm/sec with a 60Hz  notched filter applied as appropriate. EEG data were recorded continuously and digitally stored.  Video monitoring was available and reviewed as appropriate. Description: The posterior dominant rhythm consists of 7.5 Hz activity of moderate voltage (25-35 uV) seen predominantly in posterior head regions, symmetric and reactive to eye opening and eye closing. Sleep was characterized by vertex waves, sleep spindles (12 to 14 Hz), maximal frontocentral region. Intermittent generalized 3-6hz  theta-delta slowing was noted.  Hyperventilation and photic stimulation were not performed.   ABNORMALITY - Intermittent slow, generalized IMPRESSION: This study is suggestive of mild diffuse encephalopathy. No seizures or epileptiform discharges were seen throughout the recording. Please note lack of epileptiform activity during interictal EEG does not exclude the diagnosis of epilepsy. Charlsie Quest   CT HEAD CODE STROKE WO CONTRAST  Result Date: 07/11/2023 CLINICAL DATA:  Code stroke. Left gaze, right-sided weakness. Responsive. EXAM: CT ANGIOGRAPHY HEAD AND NECK TECHNIQUE: Multidetector CT imaging of the head and neck was performed using the standard protocol during bolus administration of intravenous contrast. Multiplanar CT image reconstructions and MIPs were obtained to evaluate the vascular anatomy. Carotid stenosis measurements (when applicable) are obtained utilizing NASCET criteria, using the distal internal carotid diameter as the denominator. RADIATION DOSE REDUCTION: This exam was performed according to the departmental dose-optimization program which includes automated exposure control, adjustment of the mA and/or kV according to patient size and/or use of iterative reconstruction technique. CONTRAST:  75mL OMNIPAQUE IOHEXOL 350 MG/ML SOLN COMPARISON:  CT head 06/30/2023, CTA head and neck 02/18/2023 FINDINGS: CT HEAD FINDINGS Brain: There is no acute intracranial hemorrhage, extra-axial fluid collection, or acute infarct There is unchanged parenchymal volume loss with prominence of the ventricular system and extra-axial CSF spaces. The ventricles are stable in size. The remote cortical infarcts in the left frontal lobe posteriorly and right parietal lobe and small remote infarcts in the left corona radiata are unchanged. The pituitary and suprasellar region are normal. There is no mass lesion. There is no mass effect or midline shift. Vascular: See below. Skull: Normal. Negative for fracture or focal lesion. Sinuses/Orbits:  There is mild mucosal thickening in the paranasal sinuses. The globes and orbits are unremarkable. Other: The mastoid air cells and middle ear cavities are clear. ASPECTS ([place] Stroke Program Early CT Score) - Ganglionic level infarction (caudate, lentiform nuclei, internal capsule, insula, M1-M3 cortex): 7 - Supraganglionic infarction (M4-M6 cortex): 3 Total score (0-10 with 10 being normal): 10 CTA NECK FINDINGS Aortic arch: Is mild calcified plaque in the aortic arch. The origins of the major branch vessels are patent with calcified plaque. The subclavian arteries are patent to the level imaged. Right carotid system: The right common carotid artery is patent with scattered calcified plaque but no hemodynamically  significant stenosis or occlusion. There is calcified plaque at the bifurcation and in the proximal internal carotid artery without hemodynamically significant stenosis or occlusion. There is no evidence of dissection or aneurysm. Left carotid system: The left common carotid artery is patent with scattered calcified plaque. The patient is status post left carotid endarterectomy. The internal carotid artery is widely patent. A small linear filling defect in the proximal external carotid artery is unchanged (7-199). There is no evidence of new dissection or aneurysm. Vertebral arteries: Moderate stenosis at the origin of the right vertebral artery is unchanged. The vertebral arteries are otherwise patent, without other hemodynamically significant stenosis or occlusion. There is no evidence of dissection or aneurysm. Skeleton: There is no acute osseous abnormality or suspicious osseous lesion. There is unchanged multilevel disc and facet degeneration throughout the cervical spine with mild anterolisthesis of C3 on C4 and C7 on T1. There is no visible canal hematoma. Other neck: The soft tissues of the neck are unremarkable. Upper chest: The imaged lung apices are clear. Review of the MIP images confirms  the above findings CTA HEAD FINDINGS Anterior circulation: There is unchanged calcified plaque in the intracranial ICAs resulting in mild-to-moderate stenosis bilaterally. The bilateral MCAs and ACAS are patent, without proximal stenosis or occlusion. There is no aneurysm or AVM. Posterior circulation: There is calcified plaque in the non dominant right V4 segment resulting in mild-to-moderate stenosis. The right vertebral artery is diminutive but patent after the PICA origin. More bulky plaque in the left V4 segment resulting in mild-to-moderate stenosis is unchanged. The basilar artery is patent. The major cerebellar arteries appear patent. The bilateral PCAs are patent, without proximal stenosis or occlusion. A left posterior communicating artery is identified. There is no aneurysm or AVM. Venous sinuses: Suboptimally evaluated due to bolus timing. Anatomic variants: None. Review of the MIP images confirms the above findings IMPRESSION: 1. Stable noncontrast head CT with no acute intracranial pathology. 2. No emergent vascular finding. 3. Calcified plaque in the right carotid system without hemodynamically significant stenosis. 4. Status post left carotid endarterectomy with widely patent internal carotid artery. A small linear filling defect in the proximal external carotid artery is unchanged. 5. Unchanged calcified plaque in the intracranial ICAs and V4 segments without high-grade stenosis or occlusion, and moderate stenosis at the right vertebral artery origin. Findings of the initial noncontrast head CT communicated to Dr Roda Shutters at 3:50 pm. CTA findings communicated at 3:59 pm. Electronically Signed   By: Lesia Hausen M.D.   On: 07/11/2023 16:08   CT ANGIO HEAD NECK W WO CM (CODE STROKE)  Result Date: 07/11/2023 CLINICAL DATA:  Code stroke. Left gaze, right-sided weakness. Responsive. EXAM: CT ANGIOGRAPHY HEAD AND NECK TECHNIQUE: Multidetector CT imaging of the head and neck was performed using the standard  protocol during bolus administration of intravenous contrast. Multiplanar CT image reconstructions and MIPs were obtained to evaluate the vascular anatomy. Carotid stenosis measurements (when applicable) are obtained utilizing NASCET criteria, using the distal internal carotid diameter as the denominator. RADIATION DOSE REDUCTION: This exam was performed according to the departmental dose-optimization program which includes automated exposure control, adjustment of the mA and/or kV according to patient size and/or use of iterative reconstruction technique. CONTRAST:  75mL OMNIPAQUE IOHEXOL 350 MG/ML SOLN COMPARISON:  CT head 06/30/2023, CTA head and neck 02/18/2023 FINDINGS: CT HEAD FINDINGS Brain: There is no acute intracranial hemorrhage, extra-axial fluid collection, or acute infarct There is unchanged parenchymal volume loss with prominence of the ventricular system and  extra-axial CSF spaces. The ventricles are stable in size. The remote cortical infarcts in the left frontal lobe posteriorly and right parietal lobe and small remote infarcts in the left corona radiata are unchanged. The pituitary and suprasellar region are normal. There is no mass lesion. There is no mass effect or midline shift. Vascular: See below. Skull: Normal. Negative for fracture or focal lesion. Sinuses/Orbits: There is mild mucosal thickening in the paranasal sinuses. The globes and orbits are unremarkable. Other: The mastoid air cells and middle ear cavities are clear. ASPECTS ([place] Stroke Program Early CT Score) - Ganglionic level infarction (caudate, lentiform nuclei, internal capsule, insula, M1-M3 cortex): 7 - Supraganglionic infarction (M4-M6 cortex): 3 Total score (0-10 with 10 being normal): 10 CTA NECK FINDINGS Aortic arch: Is mild calcified plaque in the aortic arch. The origins of the major branch vessels are patent with calcified plaque. The subclavian arteries are patent to the level imaged. Right carotid system: The  right common carotid artery is patent with scattered calcified plaque but no hemodynamically significant stenosis or occlusion. There is calcified plaque at the bifurcation and in the proximal internal carotid artery without hemodynamically significant stenosis or occlusion. There is no evidence of dissection or aneurysm. Left carotid system: The left common carotid artery is patent with scattered calcified plaque. The patient is status post left carotid endarterectomy. The internal carotid artery is widely patent. A small linear filling defect in the proximal external carotid artery is unchanged (7-199). There is no evidence of new dissection or aneurysm. Vertebral arteries: Moderate stenosis at the origin of the right vertebral artery is unchanged. The vertebral arteries are otherwise patent, without other hemodynamically significant stenosis or occlusion. There is no evidence of dissection or aneurysm. Skeleton: There is no acute osseous abnormality or suspicious osseous lesion. There is unchanged multilevel disc and facet degeneration throughout the cervical spine with mild anterolisthesis of C3 on C4 and C7 on T1. There is no visible canal hematoma. Other neck: The soft tissues of the neck are unremarkable. Upper chest: The imaged lung apices are clear. Review of the MIP images confirms the above findings CTA HEAD FINDINGS Anterior circulation: There is unchanged calcified plaque in the intracranial ICAs resulting in mild-to-moderate stenosis bilaterally. The bilateral MCAs and ACAS are patent, without proximal stenosis or occlusion. There is no aneurysm or AVM. Posterior circulation: There is calcified plaque in the non dominant right V4 segment resulting in mild-to-moderate stenosis. The right vertebral artery is diminutive but patent after the PICA origin. More bulky plaque in the left V4 segment resulting in mild-to-moderate stenosis is unchanged. The basilar artery is patent. The major cerebellar arteries  appear patent. The bilateral PCAs are patent, without proximal stenosis or occlusion. A left posterior communicating artery is identified. There is no aneurysm or AVM. Venous sinuses: Suboptimally evaluated due to bolus timing. Anatomic variants: None. Review of the MIP images confirms the above findings IMPRESSION: 1. Stable noncontrast head CT with no acute intracranial pathology. 2. No emergent vascular finding. 3. Calcified plaque in the right carotid system without hemodynamically significant stenosis. 4. Status post left carotid endarterectomy with widely patent internal carotid artery. A small linear filling defect in the proximal external carotid artery is unchanged. 5. Unchanged calcified plaque in the intracranial ICAs and V4 segments without high-grade stenosis or occlusion, and moderate stenosis at the right vertebral artery origin. Findings of the initial noncontrast head CT communicated to Dr Roda Shutters at 3:50 pm. CTA findings communicated at 3:59 pm. Electronically Signed  By: Lesia Hausen M.D.   On: 07/11/2023 16:08        Scheduled Meds:  acetaminophen  1,000 mg Oral Once   apixaban  5 mg Oral BID   atorvastatin  20 mg Oral QPM   pantoprazole  40 mg Oral Daily   potassium chloride  40 mEq Oral BID   sodium chloride flush  3 mL Intravenous Once   sodium chloride flush  3 mL Intravenous Q12H   tamsulosin  0.4 mg Oral QPC breakfast   Continuous Infusions:  sodium chloride     ceFEPime (MAXIPIME) IV Stopped (07/12/23 0002)   levETIRAcetam 500 mg (07/12/23 1610)   metronidazole Stopped (07/12/23 0352)   [START ON 07/14/2023] vancomycin       LOS: 1 day    Time spent: 52 minutes spent on chart review, discussion with nursing staff, consultants, updating family and interview/physical exam; more than 50% of that time was spent in counseling and/or coordination of care.    Alvira Philips [place], DO [facility] Available via Epic secure chat 7am-7pm After these hours, please refer  to coverage provider listed on [email or web address] 07/12/2023, 10:46 AM

## 2023-07-12 NOTE — Progress Notes (Signed)
Admitted new patient from the ED. Transferred pt from stretcher to bed safely. Pt is alert and oriented, with noticeable slurred speech. With IV fluids hanging of NS x 75mL per hr. Vancomycin 1.5g IV running. Safety precautions observed. Call bell placed within reach.

## 2023-07-13 ENCOUNTER — Inpatient Hospital Stay (HOSPITAL_COMMUNITY): Payer: Medicare HMO

## 2023-07-13 DIAGNOSIS — G9340 Encephalopathy, unspecified: Secondary | ICD-10-CM | POA: Diagnosis not present

## 2023-07-13 DIAGNOSIS — R509 Fever, unspecified: Secondary | ICD-10-CM

## 2023-07-13 DIAGNOSIS — A419 Sepsis, unspecified organism: Secondary | ICD-10-CM

## 2023-07-13 LAB — BASIC METABOLIC PANEL
Anion gap: 9 (ref 5–15)
BUN: 15 mg/dL (ref 8–23)
CO2: 16 mmol/L — ABNORMAL LOW (ref 22–32)
Calcium: 8.6 mg/dL — ABNORMAL LOW (ref 8.9–10.3)
Chloride: 106 mmol/L (ref 98–111)
Creatinine, Ser: 1.3 mg/dL — ABNORMAL HIGH (ref 0.61–1.24)
GFR, Estimated: 55 mL/min — ABNORMAL LOW (ref >60)
Glucose, Bld: 91 mg/dL (ref 70–99)
Potassium: 3.5 mmol/L (ref 3.5–5.1)
Sodium: 131 mmol/L — ABNORMAL LOW (ref 135–145)

## 2023-07-13 LAB — ECHOCARDIOGRAM COMPLETE
AR max vel: 2.18 cm2
AV Area VTI: 2.51 cm2
AV Area mean vel: 2.42 cm2
AV Mean grad: 5 mmHg
AV Peak grad: 9.4 mmHg
Ao pk vel: 1.53 m/s
Area-P 1/2: 4.31 cm2
Height: 67 in
S' Lateral: 3.1 cm
Weight: 2310.42 [oz_av]

## 2023-07-13 LAB — CBC
HCT: 27 % — ABNORMAL LOW (ref 39.0–52.0)
Hemoglobin: 8.8 g/dL — ABNORMAL LOW (ref 13.0–17.0)
MCH: 28.9 pg (ref 26.0–34.0)
MCHC: 32.6 g/dL (ref 30.0–36.0)
MCV: 88.5 fL (ref 80.0–100.0)
Platelets: 230 10*3/uL (ref 150–400)
RBC: 3.05 MIL/uL — ABNORMAL LOW (ref 4.22–5.81)
RDW: 15.9 % — ABNORMAL HIGH (ref 11.5–15.5)
WBC: 9.8 10*3/uL (ref 4.0–10.5)
nRBC: 0 % (ref 0.0–0.2)

## 2023-07-13 LAB — MAGNESIUM: Magnesium: 1.1 mg/dL — ABNORMAL LOW (ref 1.7–2.4)

## 2023-07-13 MED ORDER — POTASSIUM CHLORIDE CRYS ER 20 MEQ PO TBCR
40.0000 meq | EXTENDED_RELEASE_TABLET | Freq: Once | ORAL | Status: AC
  Administered 2023-07-13: 40 meq via ORAL
  Filled 2023-07-13: qty 2

## 2023-07-13 MED ORDER — LEVETIRACETAM 500 MG PO TABS
500.0000 mg | ORAL_TABLET | Freq: Two times a day (BID) | ORAL | Status: DC
  Administered 2023-07-13 – 2023-07-18 (×11): 500 mg via ORAL
  Filled 2023-07-13 (×11): qty 1

## 2023-07-13 MED ORDER — MAGNESIUM SULFATE 4 GM/100ML IV SOLN
4.0000 g | Freq: Once | INTRAVENOUS | Status: AC
  Administered 2023-07-13: 4 g via INTRAVENOUS
  Filled 2023-07-13: qty 100

## 2023-07-13 NOTE — Progress Notes (Signed)
PROGRESS NOTE    Richard Carter  LKG:[id number] DOB: 06-15-1941 DOA: 07/11/2023 PCP: Marisue Ivan, MD    Brief Narrative:   FRANCHESCO Carter is a 82 y.o. male with past medical history significant for CVA, seizure disorder, mixed Alzheimer's and vascular dementia, persistent atrial fibrillation on Eliquis, essential hypertension, hyperlipidemia, carotid artery stenosis s/p left CEA, remote EtOH abuse, CKD stage IIIa, COPD, tobacco use disorder, medical noncompliance who presented to [facility] ED on 8/16 after being found down approximately 3 PM for unknown amount of time by his neighbor.  Last known well at 12 PM same day.  Patient was laying in the sun altered/unconscious.  EMS was called and patient was brought to the ED for further evaluation.  On arrival to the ED, patient was noted to be disoriented, garbled speech with mild right-sided weakness.  Initially on 10 L nonrebreather via EMS but was quickly titrated down.  Code stroke was initiated.  Temperature 102.1 F, HR 89, RR 26, BP 105/56, SpO2 94% on 4 L nasal cannula.  Sodium 138, potassium 3.2, chloride 109, glucose 150, BUN 21, creatinine 2.10.  Ionized calcium 1.21.  Hemoglobin 9.9.  EtOH level less than 10.  TSH 1.844.  INR 2.4.  Vitamin B12 671, lactic acid 1.0.  Chest x-ray with no acute cardiopulmonary disease findings.  COVID/flu/RSV PCR negative.  CT head without contrast with no acute intracranial abnormality.  CT angiogram head/neck with no emergent vascular finding, noted left carotid endarterectomy with widely patent internal carotid artery.  MRI brain without contrast with no acute reversible finding, prominent atrophy and chronic ischemic injury.  Neurology was consulted, patient was given loading dose of IV Keppra 2 g.  TRH consulted for admission for further evaluation and management of acute metabolic encephalopathy concerning for breakthrough seizure, urinary tract infection.  Assessment & Plan:   Acute  metabolic encephalopathy: Resolved Hx seizure disorder with concern for breakthrough seizure Patient presenting to ED after being found down by a bystander for unknown known amount of time.  Patient was found on the ground on the front porch under direct sunshine unconscious.  Patient with known history of seizure disorder, apparently noncompliant with Keppra.  Patient is noted to be febrile, although laying in the sun for likely several hours.  Potassium slightly low, EtOH level less than 10, TSH, vitamin B12 within normal limits.  Lactic acid within normal limits.  Chest x-ray unrevealing.  CT head, CT angiogram head/neck and MRI brain with no acute findings.  Patient was given 2 g IV Keppra loading dose.  EEG with mild diffuse encephalopathy no seizures or epileptiform discharges noted.  Neurology was initially consulted and now signed off. -- Keppra 500mg  PO BID -- PT/OT recommending home health  Hypokalemia Potassium 3.2 on admission, repleted.  3.5 this morning. -- Repeat electrolytes in the a.m. to include magnesium  Hypomagnesemia Magnesium 1.1, will replete. -- Repeat magnesium level in a.m.  Urinary tract infection Hx E. coli UTI Patient with history of multiple E. coli UTIs in the past on EMR review.  Initially started on empiric antibiotics with vancomycin, cefepime and metronidazole.  Previous urine cultures with susceptibility to ceftriaxone.  Urinalysis with large leukocytes, positive nitrite, rare bacteria, greater than 50 WBCs. -- Urine culture: Pending; although obtained after initiation of IV antibiotics -- Blood cultures x 2: NG x 2 days -- Ceftriaxone 1 g IV every 24 hours  Hyperthermia likely secondary to environmental exposure Temperature 102.1 F on ED arrival, likely secondary  to environmental exposure as being found down outside under the sun/heat.  Temperature has now normalized.  Hx CVA CT head, MRI brain negative for acute finding. -- Atorvastatin 20 mg p.o.  daily -- Eliquis 5 mg p.o. twice daily  Mixed Alzheimer's and vascular dementia --Delirium precautions --Get up during the day --Encourage a familiar face to remain present throughout the day --Keep blinds open and lights on during daylight hours --Minimize the use of opioids/benzodiazepines  Dysphagia Seen by speech therapy.  Recommend dysphagia 1, pured diet with nectar thick liquid  Persistent atrial fibrillation Essential hypertension Blood pressure 135/64 this morning, continue to hold home amlodipine. -- Eliquis 5mg  PO BID -- Monitor on telemetry  Hyperlipidemia -- Atorvastatin 20 mg p.o. daily  Hx carotid artery stenosis  s/p left CEA  CKD stage IIIa Creatinine 1.90>1.30, stable -- Avoid nephrotoxins, renally dose all medications -- BMP in the a.m.  COPD Not oxygen dependent at baseline, currently not utilizing any controlling inhalers at home. -- Albuterol neb every 6 hours as needed wheezing/shortness of breath  BPH: -- Tamsulosin 0.4 mg p.o. daily  Remote EtOH abuse Currently denies  Medical noncompliance Complicates all facets of care  Weakness/debility/deconditioning/gait disturbance: Seen by PT/OT, currently recommending home health on discharge.  DVT prophylaxis: apixaban (ELIQUIS) tablet 2.5 mg Start: 07/12/23 2200 SCDs Start: 07/12/23 0159 apixaban (ELIQUIS) tablet 2.5 mg    Code Status: DNR Family Communication: No family present at bedside this morning  Disposition Plan:  Level of care: Med-Surg Status is: Inpatient Remains inpatient appropriate because: IV antibiotics, further workup of encephalopathy likely concerning for breakthrough seizure    Consultants:  Neurology  Procedures:  EEG  Antimicrobials:  Vancomycin 8/16 - 8/16 Cefepime 8/16 - 8/16 Metronidazole 8/16 - 8/16 Ceftriaxone 8/17>>   Subjective: Patient seen examined bedside, resting comfortably.  Lying in bed.  No specific complaints this morning.  Blood pressure  improved.  Remains on IV antibiotics.  No specific questions or concerns at this time.  Denies headache, no visual changes, no chest pain, no palpitations, no shortness of breath, no cough/congestion, no fever/chills/night sweats, no nausea/vomiting/diarrhea, no focal weakness, no fatigue, no paresthesias.  No acute events overnight per nursing staff.  Objective: Vitals:   07/12/23 1640 07/12/23 2000 07/13/23 0337 07/13/23 0800  BP: (!) 150/89 128/67 135/64 135/70  Pulse: 80 76 69 68  Resp: 20  17 17   Temp: 98.6 F (37 C) 98.4 F (36.9 C) 98.5 F (36.9 C) 99.3 F (37.4 C)  TempSrc: Oral Oral  Oral  SpO2: 100% 97% 97% 97%  Weight:      Height:        Intake/Output Summary (Last 24 hours) at 07/13/2023 1023 Last data filed at 07/13/2023 0700 Gross per 24 hour  Intake 2183.89 ml  Output 1675 ml  Net 508.89 ml   Filed Weights   07/11/23 1500 07/11/23 1555 07/12/23 0205  Weight: 65.2 kg 65.2 kg 65.5 kg    Examination:  Physical Exam: GEN: NAD, alert and oriented x 3, chronically ill in appearance, appears older than stated age HEENT: NCAT, PERRL, EOMI, sclera clear, dry mucous membranes PULM: CTAB w/o wheezes/crackles, normal respiratory effort, on room air CV: RRR w/o M/G/R GI: abd soft, NTND, NABS, no R/G/M MSK: no peripheral edema, muscle strength globally intact 5/5 bilateral upper/lower extremities NEURO: CN II-XII intact, no focal deficits, sensation to light touch intact PSYCH: Depressed mood, flat affect    Data Reviewed: I have personally reviewed following labs and imaging  studies  CBC: Recent Labs  Lab 07/11/23 1540 07/11/23 1725 07/12/23 0246 07/13/23 0407  WBC  --  11.2* 10.9* 9.8  NEUTROABS  --  9.4* 8.4*  --   HGB 9.9* 9.8* 9.2* 8.8*  HCT 29.0* 29.3* 28.0* 27.0*  MCV  --  90.4 88.6 88.5  PLT  --  223 236 230   Basic Metabolic Panel: Recent Labs  Lab 07/11/23 1540 07/11/23 1725 07/12/23 0246 07/13/23 0407  NA 138 134* 136 131*  K 3.2* 3.0*  3.5 3.5  CL 109 108 110 106  CO2  --  15* 17* 16*  GLUCOSE 150* 138* 103* 91  BUN 21 22 21 15   CREATININE 2.10* 2.01* 1.90* 1.30*  CALCIUM  --  8.7* 8.5* 8.6*  MG  --   --  1.3* 1.1*  PHOS  --   --  2.9  --    GFR: Estimated Creatinine Clearance: 39.9 mL/min (A) (by C-G formula based on SCr of 1.3 mg/dL (H)). Liver Function Tests: Recent Labs  Lab 07/11/23 1725 07/12/23 0246  AST 16 73*  ALT 13 21  ALKPHOS 71 68  BILITOT 0.6 0.3  PROT 6.3* 6.3*  ALBUMIN 2.8* 2.9*   No results for input(s): "LIPASE", "AMYLASE" in the last 168 hours. Recent Labs  Lab 07/11/23 1704  AMMONIA 20   Coagulation Profile: Recent Labs  Lab 07/11/23 1725  INR 2.4*   Cardiac Enzymes: Recent Labs  Lab 07/11/23 1725  CKTOTAL 156   BNP (last 3 results) No results for input(s): "PROBNP" in the last 8760 hours. HbA1C: No results for input(s): "HGBA1C" in the last 72 hours. CBG: Recent Labs  Lab 07/11/23 1535  GLUCAP 130*   Lipid Profile: No results for input(s): "CHOL", "HDL", "LDLCALC", "TRIG", "CHOLHDL", "LDLDIRECT" in the last 72 hours. Thyroid Function Tests: Recent Labs    07/11/23 1726  TSH 1.844   Anemia Panel: Recent Labs    07/11/23 1726  VITAMINB12 671   Sepsis Labs: Recent Labs  Lab 07/12/23 0238 07/12/23 0456  PROCALCITON 17.61  --   LATICACIDVEN  --  1.0    Recent Results (from the past 240 hour(s))  Blood culture (routine x 2)     Status: None (Preliminary result)   Collection Time: 07/11/23  5:04 PM   Specimen: BLOOD  Result Value Ref Range Status   Specimen Description BLOOD SITE NOT SPECIFIED  Final   Special Requests   Final    BOTTLES DRAWN AEROBIC AND ANAEROBIC Blood Culture adequate volume   Culture   Final    NO GROWTH 2 DAYS Performed at [facility], [address], [place], [place] [address]    Report Status PENDING  Incomplete  Blood culture (routine x 2)     Status: None (Preliminary result)   Collection Time: 07/11/23  5:11 PM    Specimen: BLOOD  Result Value Ref Range Status   Specimen Description BLOOD SITE NOT SPECIFIED  Final   Special Requests   Final    BOTTLES DRAWN AEROBIC AND ANAEROBIC Blood Culture adequate volume   Culture   Final    NO GROWTH 2 DAYS Performed at [facility], [address], [place], [place] [address]    Report Status PENDING  Incomplete  Resp panel by RT-PCR (RSV, Flu A&B, Covid) Anterior Nasal Swab     Status: None   Collection Time: 07/11/23  8:25 PM   Specimen: Anterior Nasal Swab  Result Value Ref Range  Status   SARS Coronavirus 2 by RT PCR NEGATIVE NEGATIVE Final   Influenza A by PCR NEGATIVE NEGATIVE Final   Influenza B by PCR NEGATIVE NEGATIVE Final    Comment: (NOTE) The Xpert Xpress SARS-CoV-2/FLU/RSV plus assay is intended as an aid in the diagnosis of influenza from Nasopharyngeal swab specimens and should not be used as a sole basis for treatment. Nasal washings and aspirates are unacceptable for Xpert Xpress SARS-CoV-2/FLU/RSV testing.  Fact Sheet for Patients: BloggerCourse.com  Fact Sheet for Healthcare Providers: SeriousBroker.it  This test is not yet approved or cleared by the [place] FDA and has been authorized for detection and/or diagnosis of SARS-CoV-2 by FDA under an Emergency Use Authorization (EUA). This EUA will remain in effect (meaning this test can be used) for the duration of the COVID-19 declaration under Section 564(b)(1) of the Act, 21 U.S.C. section 360bbb-3(b)(1), unless the authorization is terminated or revoked.     Resp Syncytial Virus by PCR NEGATIVE NEGATIVE Final    Comment: (NOTE) Fact Sheet for Patients: BloggerCourse.com  Fact Sheet for Healthcare Providers: SeriousBroker.it  This test is not yet approved or cleared by the [place] FDA and has been authorized for detection and/or diagnosis of SARS-CoV-2  by FDA under an Emergency Use Authorization (EUA). This EUA will remain in effect (meaning this test can be used) for the duration of the COVID-19 declaration under Section 564(b)(1) of the Act, 21 U.S.C. section 360bbb-3(b)(1), unless the authorization is terminated or revoked.  Performed at [facility], [address], [place], [place] [address]   Culture, blood (x 2)     Status: None (Preliminary result)   Collection Time: 07/12/23  2:40 AM   Specimen: BLOOD RIGHT HAND  Result Value Ref Range Status   Specimen Description BLOOD RIGHT HAND  Final   Special Requests   Final    BOTTLES DRAWN AEROBIC AND ANAEROBIC Blood Culture adequate volume   Culture   Final    NO GROWTH 1 DAY Performed at [facility], [address] [place], [place] [address]    Report Status PENDING  Incomplete  Culture, blood (x 2)     Status: None (Preliminary result)   Collection Time: 07/12/23  2:50 AM   Specimen: BLOOD RIGHT ARM  Result Value Ref Range Status   Specimen Description BLOOD RIGHT ARM  Final   Special Requests   Final    BOTTLES DRAWN AEROBIC AND ANAEROBIC Blood Culture results may not be optimal due to an excessive volume of blood received in culture bottles   Culture   Final    NO GROWTH 1 DAY Performed at [facility], [address], [place], [place] [address]    Report Status PENDING  Incomplete  MRSA Next Gen by PCR, Nasal     Status: None   Collection Time: 07/12/23  7:00 AM   Specimen: Nasal Mucosa; Nasal Swab  Result Value Ref Range Status   MRSA by PCR Next Gen NOT DETECTED NOT DETECTED Final    Comment: (NOTE) The GeneXpert MRSA Assay (FDA approved for NASAL specimens only), is one component of a comprehensive MRSA colonization surveillance program. It is not intended to diagnose MRSA infection nor to guide or monitor treatment for MRSA infections. Test performance is not FDA approved in patients less than 20 years old. Performed at [facility], [address] [place], [place] [address]          Radiology  Studies: MR BRAIN WO CONTRAST  Result Date: 07/12/2023 CLINICAL DATA:  Stroke follow-up EXAM: MRI HEAD WITHOUT CONTRAST TECHNIQUE: Multiplanar, multiecho pulse sequences of the brain and surrounding structures were obtained without intravenous contrast. COMPARISON:  Head CT from yesterday FINDINGS: Brain: No acute infarction, hemorrhage, hydrocephalus, extra-axial collection or mass lesion. Chronic superior left frontal infarct which is moderate to large. Moderate chronic right parietal cortex infarct. Small chronic right occipital cortex infarct. Ischemic gliosis in the cerebral white matter. Chronic lacunar infarct in the right paramedian pons. Pronounced brain atrophy. There are some chronic microhemorrhages primarily in the deep brain and likely ischemic. Vascular: Grossly preserved major flow voids Skull and upper cervical spine: No focal marrow lesion. Facet degeneration with C3-4 anterolisthesis. Sinuses/Orbits: Negative IMPRESSION: 1. No acute or reversible finding. 2. Prominent atrophy and chronic ischemic injury. Electronically Signed   By: Tiburcio Pea M.D.   On: 07/12/2023 06:28   DG CHEST PORT 1 VIEW  Result Date: 07/11/2023 CLINICAL DATA:  Respiratory distress chest EXAM: PORTABLE CHEST 1 VIEW COMPARISON:  Chest x-ray dated June 30, 2023 FINDINGS: Unchanged cardiac and mediastinal contours. Widening of the right paratracheal region, unchanged when compared with the prior pleural priors and likely due to goiter or vascular tortuosity. Moderate hiatal hernia. No focal consolidation. No evidence of pleural effusion or pneumothorax. Old left-sided rib fractures. IMPRESSION: 1. No acute cardiopulmonary process. 2. Moderate hiatal hernia. Electronically Signed   By: Allegra Lai M.D.   On: 07/11/2023 16:39   EEG adult  Result Date: 07/11/2023 Charlsie Quest, MD     07/11/2023  8:12 PM Patient Name: Richard Carter  MRN: [id number] Epilepsy Attending: Charlsie Quest Referring Physician/Provider: Marjorie Smolder, NP Date: 07/11/2023 Duration: 23.36 mins Patient history: 82yo M with left gaze deviation and right sided weakness getting eeg to evaluate for seizure. Level of alertness: Awake, asleep AEDs during EEG study: None Technical aspects: This EEG study was done with scalp electrodes positioned according to the 10-20 International system of electrode placement. Electrical activity was reviewed with band pass filter of 1-70Hz , sensitivity of 7 uV/mm, display speed of 72mm/sec with a 60Hz  notched filter applied as appropriate. EEG data were recorded continuously and digitally stored.  Video monitoring was available and reviewed as appropriate. Description: The posterior dominant rhythm consists of 7.5 Hz activity of moderate voltage (25-35 uV) seen predominantly in posterior head regions, symmetric and reactive to eye opening and eye closing. Sleep was characterized by vertex waves, sleep spindles (12 to 14 Hz), maximal frontocentral region. Intermittent generalized 3-6hz  theta-delta slowing was noted. Hyperventilation and photic stimulation were not performed.   ABNORMALITY - Intermittent slow, generalized IMPRESSION: This study is suggestive of mild diffuse encephalopathy. No seizures or epileptiform discharges were seen throughout the recording. Please note lack of epileptiform activity during interictal EEG does not exclude the diagnosis of epilepsy. Charlsie Quest   CT HEAD CODE STROKE WO CONTRAST  Result Date: 07/11/2023 CLINICAL DATA:  Code stroke. Left gaze, right-sided weakness. Responsive. EXAM: CT ANGIOGRAPHY HEAD AND NECK TECHNIQUE: Multidetector CT imaging of the head and neck was performed using the standard protocol during bolus administration of intravenous contrast. Multiplanar CT image reconstructions and MIPs were obtained to evaluate the vascular anatomy. Carotid stenosis measurements (when  applicable) are obtained utilizing NASCET criteria, using the distal internal carotid diameter as the denominator. RADIATION DOSE REDUCTION: This exam was performed according to the departmental dose-optimization program which includes automated exposure control, adjustment of the mA  and/or kV according to patient size and/or use of iterative reconstruction technique. CONTRAST:  75mL OMNIPAQUE IOHEXOL 350 MG/ML SOLN COMPARISON:  CT head 06/30/2023, CTA head and neck 02/18/2023 FINDINGS: CT HEAD FINDINGS Brain: There is no acute intracranial hemorrhage, extra-axial fluid collection, or acute infarct There is unchanged parenchymal volume loss with prominence of the ventricular system and extra-axial CSF spaces. The ventricles are stable in size. The remote cortical infarcts in the left frontal lobe posteriorly and right parietal lobe and small remote infarcts in the left corona radiata are unchanged. The pituitary and suprasellar region are normal. There is no mass lesion. There is no mass effect or midline shift. Vascular: See below. Skull: Normal. Negative for fracture or focal lesion. Sinuses/Orbits: There is mild mucosal thickening in the paranasal sinuses. The globes and orbits are unremarkable. Other: The mastoid air cells and middle ear cavities are clear. ASPECTS ([place] Stroke Program Early CT Score) - Ganglionic level infarction (caudate, lentiform nuclei, internal capsule, insula, M1-M3 cortex): 7 - Supraganglionic infarction (M4-M6 cortex): 3 Total score (0-10 with 10 being normal): 10 CTA NECK FINDINGS Aortic arch: Is mild calcified plaque in the aortic arch. The origins of the major branch vessels are patent with calcified plaque. The subclavian arteries are patent to the level imaged. Right carotid system: The right common carotid artery is patent with scattered calcified plaque but no hemodynamically significant stenosis or occlusion. There is calcified plaque at the bifurcation and in the proximal  internal carotid artery without hemodynamically significant stenosis or occlusion. There is no evidence of dissection or aneurysm. Left carotid system: The left common carotid artery is patent with scattered calcified plaque. The patient is status post left carotid endarterectomy. The internal carotid artery is widely patent. A small linear filling defect in the proximal external carotid artery is unchanged (7-199). There is no evidence of new dissection or aneurysm. Vertebral arteries: Moderate stenosis at the origin of the right vertebral artery is unchanged. The vertebral arteries are otherwise patent, without other hemodynamically significant stenosis or occlusion. There is no evidence of dissection or aneurysm. Skeleton: There is no acute osseous abnormality or suspicious osseous lesion. There is unchanged multilevel disc and facet degeneration throughout the cervical spine with mild anterolisthesis of C3 on C4 and C7 on T1. There is no visible canal hematoma. Other neck: The soft tissues of the neck are unremarkable. Upper chest: The imaged lung apices are clear. Review of the MIP images confirms the above findings CTA HEAD FINDINGS Anterior circulation: There is unchanged calcified plaque in the intracranial ICAs resulting in mild-to-moderate stenosis bilaterally. The bilateral MCAs and ACAS are patent, without proximal stenosis or occlusion. There is no aneurysm or AVM. Posterior circulation: There is calcified plaque in the non dominant right V4 segment resulting in mild-to-moderate stenosis. The right vertebral artery is diminutive but patent after the PICA origin. More bulky plaque in the left V4 segment resulting in mild-to-moderate stenosis is unchanged. The basilar artery is patent. The major cerebellar arteries appear patent. The bilateral PCAs are patent, without proximal stenosis or occlusion. A left posterior communicating artery is identified. There is no aneurysm or AVM. Venous sinuses:  Suboptimally evaluated due to bolus timing. Anatomic variants: None. Review of the MIP images confirms the above findings IMPRESSION: 1. Stable noncontrast head CT with no acute intracranial pathology. 2. No emergent vascular finding. 3. Calcified plaque in the right carotid system without hemodynamically significant stenosis. 4. Status post left carotid endarterectomy with widely patent internal carotid artery. A  small linear filling defect in the proximal external carotid artery is unchanged. 5. Unchanged calcified plaque in the intracranial ICAs and V4 segments without high-grade stenosis or occlusion, and moderate stenosis at the right vertebral artery origin. Findings of the initial noncontrast head CT communicated to Dr Roda Shutters at 3:50 pm. CTA findings communicated at 3:59 pm. Electronically Signed   By: Lesia Hausen M.D.   On: 07/11/2023 16:08   CT ANGIO HEAD NECK W WO CM (CODE STROKE)  Result Date: 07/11/2023 CLINICAL DATA:  Code stroke. Left gaze, right-sided weakness. Responsive. EXAM: CT ANGIOGRAPHY HEAD AND NECK TECHNIQUE: Multidetector CT imaging of the head and neck was performed using the standard protocol during bolus administration of intravenous contrast. Multiplanar CT image reconstructions and MIPs were obtained to evaluate the vascular anatomy. Carotid stenosis measurements (when applicable) are obtained utilizing NASCET criteria, using the distal internal carotid diameter as the denominator. RADIATION DOSE REDUCTION: This exam was performed according to the departmental dose-optimization program which includes automated exposure control, adjustment of the mA and/or kV according to patient size and/or use of iterative reconstruction technique. CONTRAST:  75mL OMNIPAQUE IOHEXOL 350 MG/ML SOLN COMPARISON:  CT head 06/30/2023, CTA head and neck 02/18/2023 FINDINGS: CT HEAD FINDINGS Brain: There is no acute intracranial hemorrhage, extra-axial fluid collection, or acute infarct There is unchanged  parenchymal volume loss with prominence of the ventricular system and extra-axial CSF spaces. The ventricles are stable in size. The remote cortical infarcts in the left frontal lobe posteriorly and right parietal lobe and small remote infarcts in the left corona radiata are unchanged. The pituitary and suprasellar region are normal. There is no mass lesion. There is no mass effect or midline shift. Vascular: See below. Skull: Normal. Negative for fracture or focal lesion. Sinuses/Orbits: There is mild mucosal thickening in the paranasal sinuses. The globes and orbits are unremarkable. Other: The mastoid air cells and middle ear cavities are clear. ASPECTS ([place] Stroke Program Early CT Score) - Ganglionic level infarction (caudate, lentiform nuclei, internal capsule, insula, M1-M3 cortex): 7 - Supraganglionic infarction (M4-M6 cortex): 3 Total score (0-10 with 10 being normal): 10 CTA NECK FINDINGS Aortic arch: Is mild calcified plaque in the aortic arch. The origins of the major branch vessels are patent with calcified plaque. The subclavian arteries are patent to the level imaged. Right carotid system: The right common carotid artery is patent with scattered calcified plaque but no hemodynamically significant stenosis or occlusion. There is calcified plaque at the bifurcation and in the proximal internal carotid artery without hemodynamically significant stenosis or occlusion. There is no evidence of dissection or aneurysm. Left carotid system: The left common carotid artery is patent with scattered calcified plaque. The patient is status post left carotid endarterectomy. The internal carotid artery is widely patent. A small linear filling defect in the proximal external carotid artery is unchanged (7-199). There is no evidence of new dissection or aneurysm. Vertebral arteries: Moderate stenosis at the origin of the right vertebral artery is unchanged. The vertebral arteries are otherwise patent, without other  hemodynamically significant stenosis or occlusion. There is no evidence of dissection or aneurysm. Skeleton: There is no acute osseous abnormality or suspicious osseous lesion. There is unchanged multilevel disc and facet degeneration throughout the cervical spine with mild anterolisthesis of C3 on C4 and C7 on T1. There is no visible canal hematoma. Other neck: The soft tissues of the neck are unremarkable. Upper chest: The imaged lung apices are clear. Review of the MIP images confirms the  above findings CTA HEAD FINDINGS Anterior circulation: There is unchanged calcified plaque in the intracranial ICAs resulting in mild-to-moderate stenosis bilaterally. The bilateral MCAs and ACAS are patent, without proximal stenosis or occlusion. There is no aneurysm or AVM. Posterior circulation: There is calcified plaque in the non dominant right V4 segment resulting in mild-to-moderate stenosis. The right vertebral artery is diminutive but patent after the PICA origin. More bulky plaque in the left V4 segment resulting in mild-to-moderate stenosis is unchanged. The basilar artery is patent. The major cerebellar arteries appear patent. The bilateral PCAs are patent, without proximal stenosis or occlusion. A left posterior communicating artery is identified. There is no aneurysm or AVM. Venous sinuses: Suboptimally evaluated due to bolus timing. Anatomic variants: None. Review of the MIP images confirms the above findings IMPRESSION: 1. Stable noncontrast head CT with no acute intracranial pathology. 2. No emergent vascular finding. 3. Calcified plaque in the right carotid system without hemodynamically significant stenosis. 4. Status post left carotid endarterectomy with widely patent internal carotid artery. A small linear filling defect in the proximal external carotid artery is unchanged. 5. Unchanged calcified plaque in the intracranial ICAs and V4 segments without high-grade stenosis or occlusion, and moderate stenosis  at the right vertebral artery origin. Findings of the initial noncontrast head CT communicated to Dr Roda Shutters at 3:50 pm. CTA findings communicated at 3:59 pm. Electronically Signed   By: Lesia Hausen M.D.   On: 07/11/2023 16:08        Scheduled Meds:  acetaminophen  1,000 mg Oral Once   apixaban  2.5 mg Oral BID   atorvastatin  20 mg Oral QPM   levETIRAcetam  500 mg Oral BID   pantoprazole  40 mg Oral Daily   sodium chloride flush  3 mL Intravenous Once   sodium chloride flush  3 mL Intravenous Q12H   tamsulosin  0.4 mg Oral QPC breakfast   Continuous Infusions:  sodium chloride 75 mL/hr at 07/13/23 0700   cefTRIAXone (ROCEPHIN)  IV Stopped (07/12/23 1348)   magnesium sulfate bolus IVPB 4 g (07/13/23 0847)     LOS: 2 days    Time spent: 52 minutes spent on chart review, discussion with nursing staff, consultants, updating family and interview/physical exam; more than 50% of that time was spent in counseling and/or coordination of care.    Alvira Philips [place], DO [facility] Available via Epic secure chat 7am-7pm After these hours, please refer to coverage provider listed on [email or web address] 07/13/2023, 10:23 AM

## 2023-07-13 NOTE — Plan of Care (Signed)
  Problem: Fluid Volume: Goal: Hemodynamic stability will improve Outcome: Progressing   Problem: Clinical Measurements: Goal: Diagnostic test results will improve Outcome: Progressing   Problem: Education: Goal: Knowledge of disease or condition will improve Outcome: Progressing Goal: Knowledge of secondary prevention will improve (MUST DOCUMENT ALL) Outcome: Progressing Goal: Knowledge of patient specific risk factors will improve Loraine Leriche N/A or DELETE if not current risk factor) Outcome: Progressing   Problem: Ischemic Stroke/TIA Tissue Perfusion: Goal: Complications of ischemic stroke/TIA will be minimized Outcome: Progressing   Problem: Coping: Goal: Will verbalize positive feelings about self Outcome: Progressing Goal: Will identify appropriate support needs Outcome: Progressing

## 2023-07-13 NOTE — Plan of Care (Signed)
  Problem: Fluid Volume: Goal: Hemodynamic stability will improve Outcome: Progressing   

## 2023-07-14 DIAGNOSIS — G9340 Encephalopathy, unspecified: Secondary | ICD-10-CM | POA: Diagnosis not present

## 2023-07-14 LAB — BASIC METABOLIC PANEL
Anion gap: 8 (ref 5–15)
BUN: 16 mg/dL (ref 8–23)
CO2: 17 mmol/L — ABNORMAL LOW (ref 22–32)
Calcium: 9 mg/dL (ref 8.9–10.3)
Chloride: 107 mmol/L (ref 98–111)
Creatinine, Ser: 1.14 mg/dL (ref 0.61–1.24)
GFR, Estimated: >60 mL/min (ref >60)
Glucose, Bld: 98 mg/dL (ref 70–99)
Potassium: 3.9 mmol/L (ref 3.5–5.1)
Sodium: 132 mmol/L — ABNORMAL LOW (ref 135–145)

## 2023-07-14 LAB — MAGNESIUM: Magnesium: 2 mg/dL (ref 1.7–2.4)

## 2023-07-14 MED ORDER — APIXABAN 5 MG PO TABS
5.0000 mg | ORAL_TABLET | Freq: Two times a day (BID) | ORAL | Status: DC
  Administered 2023-07-14 – 2023-07-18 (×8): 5 mg via ORAL
  Filled 2023-07-14 (×8): qty 1

## 2023-07-14 NOTE — TOC Initial Note (Signed)
Transition of Care [facility]) - Initial/Assessment Note    Patient Details  Name: Richard Carter MRN: [id number] Date of Birth: 15-Feb-1941  Transition [facility]) CM/SW Contact:    Baldemar Lenis, LCSW Phone Number: 07/14/2023, 1:14 PM  Clinical Narrative:    CSW updated by medical team of concerns with patient going home alone, recommendations updated to SNF. CSW spoke with POA [name], patient's neighbor. He is concerned about insurance covering SNF as the patient was just discharged from SNF not long ago and patient does not have the funds to pay if it is not covered. CSW discussed faxing out referral, will follow up with bed offers. Patient recently at [facility], but they are unable to take [facility] at this time. CSW to follow.               Expected Discharge Plan: Skilled Nursing Facility Barriers to Discharge: Continued Medical Work up, English as a second language teacher, SNF Pending bed offer   Patient Goals and CMS Choice Patient states their goals for this hospitalization and ongoing recovery are:: patient unable to participate in goal setting, not oriented CMS Medicare.gov Compare Post Acute Care list provided to:: Patient Represenative (must comment) Choice offered to / list presented to : [facility] POA / Guardian [place] ownership interest in [facility].provided to:: [facility] POA / Guardian    Expected Discharge Plan and Services     Post Acute Care Choice: Skilled Nursing Facility Living arrangements for the past 2 months: Single Family Home                                      Prior Living Arrangements/Services Living arrangements for the past 2 months: Single Family Home Lives with:: Self Patient language and need for interpreter reviewed:: No Do you feel safe going back to the place where you live?: Yes      Need for Family Participation in Patient Care: Yes (Comment) Care giver support system in place?: No (comment)   Criminal Activity/Legal Involvement  Pertinent to Current Situation/Hospitalization: No - Comment as needed  Activities of Daily Living Home Assistive Devices/Equipment: None ADL Screening (condition at time of admission) Patient's cognitive ability adequate to safely complete daily activities?: Yes Is the patient deaf or have difficulty hearing?: No Does the patient have difficulty seeing, even when wearing glasses/contacts?: No Does the patient have difficulty concentrating, remembering, or making decisions?: No Patient able to express need for assistance with ADLs?: Yes Does the patient have difficulty dressing or bathing?: No Independently performs ADLs?: Yes (appropriate for developmental age) Does the patient have difficulty walking or climbing stairs?: No Weakness of Legs: Both Weakness of Arms/Hands: None  Permission Sought/Granted Permission sought to share information with : Facility Medical sales representative, Family Supports Permission granted to share information with : Yes, Verbal Permission Granted  Share Information with NAME: [name]  Permission granted to share info w AGENCY: SNF  Permission granted to share info w Relationship: Neighbor/POA     Emotional Assessment   Attitude/Demeanor/Rapport: Unable to Assess Affect (typically observed): Unable to Assess Orientation: : Oriented to Self, Oriented to Place Alcohol / Substance Use: Not Applicable Psych Involvement: No (comment)  Admission diagnosis:  Encephalopathy acute [G93.40] Patient Active Problem List   Diagnosis Date Noted   Encephalopathy acute 07/11/2023   History of alcohol use disorder 05/31/2023   Goals of care, counseling/discussion 04/03/2023   Malnutrition of moderate  degree 04/03/2023   Stroke (HCC) 03/27/2023   Stenosis of left carotid artery 02/20/2023   Altered mental status 02/20/2023   Acute stroke due to ischemia (HCC) 02/17/2023   Stroke-like symptom 02/16/2023   Leukocytosis 02/16/2023   Anemia of chronic disease 07/22/2022    Protein-calorie malnutrition, severe 05/28/2022   Dysphagia due to recent cerebrovascular accident 05/28/2022   Acute metabolic encephalopathy 05/27/2022   History of stroke 05/27/2022   Aspiration pneumonia (HCC) 04/23/2022   Nicotine dependence    Elevated troponin    Acute CVA (cerebrovascular accident) (HCC) 05/07/2021   Non compliance w medication regimen 05/07/2021   Dementia (HCC) 05/07/2021   AF (paroxysmal atrial fibrillation) (HCC) 05/07/2021   HTN (hypertension) 11/29/2020   Alcohol abuse 11/29/2020   AKI (acute kidney injury) (HCC) 11/29/2020   Atrial flutter with rapid ventricular response (HCC) 11/29/2020   Seizures (HCC) 02/22/2020   Hypomagnesemia 10/29/2019   Pubic ramus fracture (HCC) 07/29/2019   Acetabular fracture (HCC) 07/29/2019   HLD (hyperlipidemia) 07/29/2019   GERD (gastroesophageal reflux disease) 07/29/2019   UTI (urinary tract infection) 07/29/2019   Vitamin D deficiency 09/29/2015   PCP:  Marisue Ivan, MD Pharmacy:   [facility] [address]), [place] - [address] [place] ([place]) [place] [address] Phone: [phone] Fax: [phone]     Social Determinants of Health (SDOH) Social History: SDOH Screenings   Food Insecurity: No Food Insecurity (07/12/2023)  Housing: Patient Declined (07/12/2023)  Transportation Needs: No Transportation Needs (07/12/2023)  Utilities: Not At Risk (07/12/2023)  Financial Resource Strain: Low Risk  (07/08/2023)   Received from [facility]  Tobacco Use: Medium Risk (07/11/2023)   SDOH Interventions:     Readmission Risk Interventions    04/21/2022    1:55 PM  Readmission Risk Prevention Plan  Post Dischage Appt Complete  Medication Screening Complete  Transportation Screening Complete

## 2023-07-14 NOTE — Progress Notes (Signed)
Speech Language Pathology Treatment: Dysphagia  Patient Details Name: Richard Carter MRN: [id number] DOB: 01-21-1941 Today's Date: 07/14/2023 Time: 6295-2841 SLP Time Calculation (min) (ACUTE ONLY): 9 min  Assessment / Plan / Recommendation Clinical Impression  Pt sitting upright in chair eating lunch and able to self feed. He did have s/s aspiration with coughing (mostly delayed) once when attempting to talk and taking large sips and needed cues for smaller sips. He is on a puree and nectar thick liquid diet at home per documentation from initial evaluation. Aspiration risk will be reduced as he is able to follow compensatory strategies. Recommend continue puree diet with nectar thick liquids. May need instrumental assessment as previous one was in May 2024 to evaluate current swallow function.    HPI HPI: 82 y.o. male presents to the emergency department 8/16 for evaluation of code stroke.  Patient was reportedly found altered and down on his front porch lying in the sun. MRI negative for acute stroke.  PMHx: remote alcohol abuse, dementia, GERD, hypertension, hyperlipidemia, seizures, ischemic CVA, atrial fibrillation on Eliquis.      SLP Plan  Continue with current plan of care      Recommendations for follow up therapy are one component of a multi-disciplinary discharge planning process, led by the attending physician.  Recommendations may be updated based on patient status, additional functional criteria and insurance authorization.    Recommendations  Diet recommendations: Dysphagia 1 (puree);Nectar-thick liquid Liquids provided via: No straw;Cup Medication Administration: Crushed with puree Supervision: Patient able to self feed (may need set up) Compensations: Small sips/bites;Slow rate Postural Changes and/or Swallow Maneuvers: Seated upright 90 degrees                  Oral care BID   Intermittent Supervision/Assistance Dysphagia, oropharyngeal phase (R13.12)      Continue with current plan of care     Royce Macadamia  07/14/2023, 2:35 PM

## 2023-07-14 NOTE — Progress Notes (Signed)
Physical Therapy Note  Received word from SW that pt does not actually have 24/7 assist available at home. She received a different report than the one I was provided during my evaluation with pt and his brother. Without 24/7 assist, pt is at high risk for falls/injury and readmission. With this new information, pt is better suited for skilled nursing care with continued inpatient follow up therapy, <3 hours/day.  I will stop by to reassess pt later today.   Kathlyn Sacramento, PT, DPT [facility]  Rehabilitation Services Physical Therapist Office: [phone] Website: [email or web address]

## 2023-07-14 NOTE — Care Management Important Message (Signed)
Important Message  Patient Details  Name: Richard Carter MRN: [id number] Date of Birth: 1941/05/17   Medicare Important Message Given:  Yes     Dorena Bodo 07/14/2023, 4:23 PM

## 2023-07-14 NOTE — Progress Notes (Signed)
Physical Therapy Treatment Patient Details Name: Richard Carter MRN: [id number] DOB: [birth year] Today's Date: 07/14/2023   History of Present Illness 82 y.o. male presents to the emergency department 8/16 for evaluation of code stroke.  Patient was reportedly found altered and down on his front porch lying in the sun. PMHx: remote alcohol abuse, dementia, GERD, hypertension, hyperlipidemia, seizures, ischemic CVA, atrial fibrillation on Eliquis.    PT Comments  Agreeable to PT session this morning. Required min assist with bed mobility, transfer, and gait (small improvement in distance tolerated today.) Requires RW for support and frequent cues for appropriate use with min assist for control. Pt provided updated hx of PLOF not consistent with information provided during evaluation. He in fact lives alone with a neighbor "[name]" coming to check on him PRN. PTA pt states he would ambulate with RW in house, would feel off balance when he drinks alcohol, but otherwise cared for himself. Due to lack of supervison and need for min assist, pt most appropriate for SNF for post acute rehab. Patient will continue to benefit from skilled physical therapy services to further improve independence with functional mobility.     If plan is discharge home, recommend the following: A little help with walking and/or transfers;A little help with bathing/dressing/bathroom;Assistance with cooking/housework;Direct supervision/assist for medications management;Direct supervision/assist for financial management;Assist for transportation;Help with stairs or ramp for entrance;Supervision due to cognitive status;Assistance with feeding   Can travel by private vehicle     Yes  Equipment Recommendations  None recommended by PT    Recommendations for Other Services OT consult     Precautions / Restrictions Precautions Precautions: Fall Restrictions Weight Bearing Restrictions: No     Mobility  Bed Mobility Overal  bed mobility: Needs Assistance Bed Mobility: Supine to Sit     Supine to sit: Min assist     General bed mobility comments: Min assist to rise to EOB, pulls through therapist hand, needs assist to scoot hip to edge and align LEs.    Transfers Overall transfer level: Needs assistance Equipment used: Rolling walker (2 wheels) Transfers: Sit to/from Stand Sit to Stand: Min assist           General transfer comment: Min assist for boost to stand. Cues for hand placement, LE alignment awareness, and RW to stabilize once upright.    Ambulation/Gait Ambulation/Gait assistance: Min assist Gait Distance (Feet): 30 Feet Assistive device: Rolling walker (2 wheels) Gait Pattern/deviations: Step-through pattern, Decreased stride length, Shuffle, Leaning posteriorly Gait velocity: decr Gait velocity interpretation: <1.31 ft/sec, indicative of household ambulator   General Gait Details: Reviewed safe AD use with RW. Intermittent min assist for RW control with turns and proximity to device with frequent VC. Encouraged to increase distance today but pt feels that he has a limited capacity to increase much further. No overt buckling.   Stairs             Wheelchair Mobility     Tilt Bed    Modified Rankin (Stroke Patients Only)       Balance Overall balance assessment: Needs assistance Sitting-balance support: No upper extremity supported, Feet supported Sitting balance-Leahy Scale: Fair     Standing balance support: Bilateral upper extremity supported, Reliant on assistive device for balance Standing balance-Leahy Scale: Poor                              Cognition Arousal: Alert Behavior During Therapy: [facility] for  tasks assessed/performed Overall Cognitive Status: No family/caregiver present to determine baseline cognitive functioning                                          Exercises General Exercises - Lower Extremity Ankle  Circles/Pumps: AROM, Both, 10 reps, Seated Quad Sets: Strengthening, Both, 10 reps, Seated Gluteal Sets: Strengthening, Both, 10 reps, Seated    General Comments General comments (skin integrity, edema, etc.): Pt provided different PLOF hx today; states neighbor chris stops by (does not provide 24/7 care. Pt states he was more independent at home than previously mentioned. He was able to walk with walker, and get in/out of bed himself PTA.      Pertinent Vitals/Pain Pain Assessment Pain Assessment: No/denies pain    Home Living                          Prior Function            PT Goals (current goals can now be found in the care plan section) Acute Rehab PT Goals Patient Stated Goal: go home PT Goal Formulation: With patient/family Time For Goal Achievement: 07/26/23 Potential to Achieve Goals: Good Progress towards PT goals: Progressing toward goals    Frequency    Min 1X/week      PT Plan      Co-evaluation              AM-PAC PT "6 Clicks" Mobility   Outcome Measure  Help needed turning from your back to your side while in a flat bed without using bedrails?: None Help needed moving from lying on your back to sitting on the side of a flat bed without using bedrails?: A Little Help needed moving to and from a bed to a chair (including a wheelchair)?: A Little Help needed standing up from a chair using your arms (e.g., wheelchair or bedside chair)?: A Little Help needed to walk in hospital room?: A Little Help needed climbing 3-5 steps with a railing? : A Lot 6 Click Score: 18    End of Session Equipment Utilized During Treatment: Gait belt Activity Tolerance: Patient tolerated treatment well Patient left: in chair;with call bell/phone within reach;with chair alarm set;with SCD's reapplied Nurse Communication: Mobility status PT Visit Diagnosis: Unsteadiness on feet (R26.81);Other abnormalities of gait and mobility (R26.89);Muscle weakness  (generalized) (M62.81);History of falling (Z91.81);Difficulty in walking, not elsewhere classified (R26.2);Other symptoms and signs involving the nervous system (R29.898)     Time: 2536-6440 PT Time Calculation (min) (ACUTE ONLY): 13 min  Charges:    $Gait Training: 8-22 mins PT General Charges $$ ACUTE PT VISIT: 1 Visit                     Kathlyn Sacramento, PT, DPT [facility]  Rehabilitation Services Physical Therapist Office: [phone] Website: [email or web address]    Berton Mount 07/14/2023, 11:33 AM

## 2023-07-14 NOTE — Progress Notes (Signed)
Richard NOTE    Richard Carter  UJW:[id number] DOB: Apr 13, 1941 DOA: 07/11/2023 PCP: Marisue Ivan, MD    Brief Narrative:   Richard Carter is a 82 y.o. male with past medical history significant for CVA, seizure disorder, mixed Alzheimer's and vascular dementia, persistent atrial fibrillation on Eliquis, essential hypertension, hyperlipidemia, carotid artery stenosis s/p left CEA, remote EtOH abuse, CKD stage IIIa, COPD, tobacco use disorder, medical noncompliance who presented to [facility] ED on 8/16 after being found down approximately 3 PM for unknown amount of time by his neighbor.  Last known well at 12 PM same day.  Patient was laying in the sun altered/unconscious.  EMS was called and patient was brought to the ED for further evaluation.  On arrival to the ED, patient was noted to be disoriented, garbled speech with mild right-sided weakness.  Initially on 10 L nonrebreather via EMS but was quickly titrated down.  Code stroke was initiated.  Temperature 102.1 F, HR 89, RR 26, BP 105/56, SpO2 94% on 4 L nasal cannula.  Sodium 138, potassium 3.2, chloride 109, glucose 150, BUN 21, creatinine 2.10.  Ionized calcium 1.21.  Hemoglobin 9.9.  EtOH level less than 10.  TSH 1.844.  INR 2.4.  Vitamin B12 671, lactic acid 1.0.  Chest x-ray with no acute cardiopulmonary disease findings.  COVID/flu/RSV PCR negative.  CT head without contrast with no acute intracranial abnormality.  CT angiogram head/neck with no emergent vascular finding, noted left carotid endarterectomy with widely patent internal carotid artery.  MRI brain without contrast with no acute reversible finding, prominent atrophy and chronic ischemic injury.  Neurology was consulted, patient was given loading dose of IV Keppra 2 g.  TRH consulted for admission for further evaluation and management of acute metabolic encephalopathy concerning for breakthrough seizure, urinary tract infection.  Assessment & Plan:   Acute  metabolic encephalopathy: Resolved Hx seizure disorder with concern for breakthrough seizure Patient presenting to ED after being found down by a bystander for unknown known amount of time.  Patient was found on the ground on the front porch under direct sunshine unconscious.  Patient with known history of seizure disorder, apparently noncompliant with Keppra.  Patient is noted to be febrile, although laying in the sun for likely several hours.  Potassium slightly low, EtOH level less than 10, TSH, vitamin B12 within normal limits.  Lactic acid within normal limits.  Chest x-ray unrevealing.  CT head, CT angiogram head/neck and MRI brain with no acute findings.  Patient was given 2 g IV Keppra loading dose.  EEG with mild diffuse encephalopathy no seizures or epileptiform discharges noted.  Neurology was initially consulted and now signed off. -- Keppra 500mg  PO BID -- Continue therapy efforts while inpatient, will need SNF placement  Hypokalemia: Resolved Potassium 3.2 on admission, repleted.  3.9 this morning.  Hypomagnesemia: Resolved Repleted, magnesium 2.0 this morning.  E. coli urinary tract infection Patient with history of multiple E. coli UTIs in the past on EMR review.  Initially started on empiric antibiotics with vancomycin, cefepime and metronidazole.  Previous urine cultures with susceptibility to ceftriaxone.  Urinalysis with large leukocytes, positive nitrite, rare bacteria, greater than 50 WBCs. -- Urine culture: >40K E. coli, pending susceptibilities -- Blood cultures x 2: NG x 3 days -- Ceftriaxone 1 g IV every 24 hours x 5 days  Hyperthermia likely secondary to environmental exposure Temperature 102.1 F on ED arrival, likely secondary to environmental exposure as being found down outside under  the sun/heat.  Temperature has now normalized.  Hx CVA CT head, MRI brain negative for acute finding. -- Atorvastatin 20 mg p.o. daily -- Eliquis 5 mg p.o. twice daily  Mixed  Alzheimer's and vascular dementia --Delirium precautions --Get up during the day --Encourage a familiar face to remain present throughout the day --Keep blinds open and lights on during daylight hours --Minimize the use of opioids/benzodiazepines  Dysphagia Seen by speech therapy.  Recommend dysphagia 1, pured diet with nectar thick liquid  Persistent atrial fibrillation Essential hypertension Blood pressure 135/64 this morning, continue to hold home amlodipine. -- Eliquis 5mg  PO BID -- Monitor on telemetry  Hyperlipidemia -- Atorvastatin 20 mg p.o. daily  Hx carotid artery stenosis  s/p left CEA  CKD stage IIIa Creatinine 1.90>1.30, stable -- Avoid nephrotoxins, renally dose all medications -- BMP in the a.m.  COPD Not oxygen dependent at baseline, currently not utilizing any controlling inhalers at home. -- Albuterol neb every 6 hours as needed wheezing/shortness of breath  BPH: -- Tamsulosin 0.4 mg p.o. daily  Remote EtOH abuse Currently denies  Medical noncompliance Complicates all facets of care  Weakness/debility/deconditioning/gait disturbance: Seen by PT/OT, currently recommending home health on discharge.  DVT prophylaxis: apixaban (ELIQUIS) tablet 2.5 mg Start: 07/12/23 2200 SCDs Start: 07/12/23 0159 apixaban (ELIQUIS) tablet 2.5 mg    Code Status: DNR Family Communication: No family present at bedside this morning  Disposition Plan:  Level of care: Med-Surg Status is: Inpatient Remains inpatient appropriate because: IV antibiotics, will need SNF placement, TOC consulted  Consultants:  Neurology  Procedures:  EEG  Antimicrobials:  Vancomycin 8/16 - 8/16 Cefepime 8/16 - 8/16 Metronidazole 8/16 - 8/16 Ceftriaxone 8/17>>   Subjective: Patient seen examined bedside, resting comfortably.  Lying in bed.  RN present.  Patient with difficulty ambulating to the bathroom with a walker with decreased depth perception.  Also RN reports difficulty  utilizing utensils and unable to feed himself.  Given that patient lives alone with some intermittent help from a neighbor likely unsafe to return home at this time.  Discussed with TOC, will need SNF placement.  Remains on IV antibiotics with urine culture sensitivities pending.  No specific questions or concerns at this time.  Denies headache, no visual changes, no chest pain, no palpitations, no shortness of breath, no cough/congestion, no fever/chills/night sweats, no nausea/vomiting/diarrhea, no focal weakness, no fatigue, no paresthesias.  No acute events or other concerns overnight per nursing staff.  Objective: Vitals:   07/13/23 2300 07/14/23 0011 07/14/23 0311 07/14/23 0734  BP: 129/65 133/64 138/69 139/65  Pulse: (!) 57 60 63 (!) 58  Resp: 18 18 18 18   Temp: 98.2 F (36.8 C) 98.8 F (37.1 C) 98 F (36.7 C) 98.3 F (36.8 C)  TempSrc: Axillary Oral Axillary   SpO2: 98% 99% 98% 99%  Weight:      Height:        Intake/Output Summary (Last 24 hours) at 07/14/2023 1010 Last data filed at 07/14/2023 8119 Gross per 24 hour  Intake 1000.13 ml  Output 1700 ml  Net -699.87 ml   Filed Weights   07/11/23 1500 07/11/23 1555 07/12/23 0205  Weight: 65.2 kg 65.2 kg 65.5 kg    Examination:  Physical Exam: GEN: NAD, alert and oriented x 3, chronically ill in appearance, appears older than stated age HEENT: NCAT, PERRL, EOMI, sclera clear, dry mucous membranes PULM: CTAB w/o wheezes/crackles, normal respiratory effort, on room air CV: RRR w/o M/G/R GI: abd soft, NTND, NABS,  no R/G/M MSK: no peripheral edema, muscle strength globally intact 5/5 bilateral upper/lower extremities NEURO: CN II-XII intact, no focal deficits, sensation to light touch intact PSYCH: Depressed mood, flat affect    Data Reviewed: I have personally reviewed following labs and imaging studies  CBC: Recent Labs  Lab 07/11/23 1540 07/11/23 1725 07/12/23 0246 07/13/23 0407  WBC  --  11.2* 10.9* 9.8   NEUTROABS  --  9.4* 8.4*  --   HGB 9.9* 9.8* 9.2* 8.8*  HCT 29.0* 29.3* 28.0* 27.0*  MCV  --  90.4 88.6 88.5  PLT  --  223 236 230   Basic Metabolic Panel: Recent Labs  Lab 07/11/23 1540 07/11/23 1725 07/12/23 0246 07/13/23 0407 07/14/23 0438  NA 138 134* 136 131* 132*  K 3.2* 3.0* 3.5 3.5 3.9  CL 109 108 110 106 107  CO2  --  15* 17* 16* 17*  GLUCOSE 150* 138* 103* 91 98  BUN 21 22 21 15 16   CREATININE 2.10* 2.01* 1.90* 1.30* 1.14  CALCIUM  --  8.7* 8.5* 8.6* 9.0  MG  --   --  1.3* 1.1* 2.0  PHOS  --   --  2.9  --   --    GFR: Estimated Creatinine Clearance: 45.5 mL/min (by C-G formula based on SCr of 1.14 mg/dL). Liver Function Tests: Recent Labs  Lab 07/11/23 1725 07/12/23 0246  AST 16 73*  ALT 13 21  ALKPHOS 71 68  BILITOT 0.6 0.3  PROT 6.3* 6.3*  ALBUMIN 2.8* 2.9*   No results for input(s): "LIPASE", "AMYLASE" in the last 168 hours. Recent Labs  Lab 07/11/23 1704  AMMONIA 20   Coagulation Profile: Recent Labs  Lab 07/11/23 1725  INR 2.4*   Cardiac Enzymes: Recent Labs  Lab 07/11/23 1725  CKTOTAL 156   BNP (last 3 results) No results for input(s): "PROBNP" in the last 8760 hours. HbA1C: No results for input(s): "HGBA1C" in the last 72 hours. CBG: Recent Labs  Lab 07/11/23 1535  GLUCAP 130*   Lipid Profile: No results for input(s): "CHOL", "HDL", "LDLCALC", "TRIG", "CHOLHDL", "LDLDIRECT" in the last 72 hours. Thyroid Function Tests: Recent Labs    07/11/23 1726  TSH 1.844   Anemia Panel: Recent Labs    07/11/23 1726  VITAMINB12 671   Sepsis Labs: Recent Labs  Lab 07/12/23 0238 07/12/23 0456  PROCALCITON 17.61  --   LATICACIDVEN  --  1.0    Recent Results (from the past 240 hour(s))  Blood culture (routine x 2)     Status: None (Preliminary result)   Collection Time: 07/11/23  5:04 PM   Specimen: BLOOD  Result Value Ref Range Status   Specimen Description BLOOD SITE NOT SPECIFIED  Final   Special Requests   Final     BOTTLES DRAWN AEROBIC AND ANAEROBIC Blood Culture adequate volume   Culture   Final    NO GROWTH 3 DAYS Performed at [facility], [address], [place], [place] [address]    Report Status PENDING  Incomplete  Blood culture (routine x 2)     Status: None (Preliminary result)   Collection Time: 07/11/23  5:11 PM   Specimen: BLOOD  Result Value Ref Range Status   Specimen Description BLOOD SITE NOT SPECIFIED  Final   Special Requests   Final    BOTTLES DRAWN AEROBIC AND ANAEROBIC Blood Culture adequate volume   Culture   Final    NO GROWTH 3 DAYS Performed at  [facility], [address] [place], [place] [address]    Report Status PENDING  Incomplete  Resp panel by RT-PCR (RSV, Flu A&B, Covid) Anterior Nasal Swab     Status: None   Collection Time: 07/11/23  8:25 PM   Specimen: Anterior Nasal Swab  Result Value Ref Range Status   SARS Coronavirus 2 by RT PCR NEGATIVE NEGATIVE Final   Influenza A by PCR NEGATIVE NEGATIVE Final   Influenza B by PCR NEGATIVE NEGATIVE Final    Comment: (NOTE) The Xpert Xpress SARS-CoV-2/FLU/RSV plus assay is intended as an aid in the diagnosis of influenza from Nasopharyngeal swab specimens and should not be used as a sole basis for treatment. Nasal washings and aspirates are unacceptable for Xpert Xpress SARS-CoV-2/FLU/RSV testing.  Fact Sheet for Patients: BloggerCourse.com  Fact Sheet for Healthcare Providers: SeriousBroker.it  This test is not yet approved or cleared by the [place] FDA and has been authorized for detection and/or diagnosis of SARS-CoV-2 by FDA under an Emergency Use Authorization (EUA). This EUA will remain in effect (meaning this test can be used) for the duration of the COVID-19 declaration under Section 564(b)(1) of the Act, 21 U.S.C. section 360bbb-3(b)(1), unless the authorization is terminated or revoked.     Resp Syncytial Virus by PCR NEGATIVE  NEGATIVE Final    Comment: (NOTE) Fact Sheet for Patients: BloggerCourse.com  Fact Sheet for Healthcare Providers: SeriousBroker.it  This test is not yet approved or cleared by the [place] FDA and has been authorized for detection and/or diagnosis of SARS-CoV-2 by FDA under an Emergency Use Authorization (EUA). This EUA will remain in effect (meaning this test can be used) for the duration of the COVID-19 declaration under Section 564(b)(1) of the Act, 21 U.S.C. section 360bbb-3(b)(1), unless the authorization is terminated or revoked.  Performed at [facility], [address], [place], [place] [address]   Culture, blood (x 2)     Status: None (Preliminary result)   Collection Time: 07/12/23  2:40 AM   Specimen: BLOOD RIGHT HAND  Result Value Ref Range Status   Specimen Description BLOOD RIGHT HAND  Final   Special Requests   Final    BOTTLES DRAWN AEROBIC AND ANAEROBIC Blood Culture adequate volume   Culture   Final    NO GROWTH 2 DAYS Performed at [facility], [address], [place], [place] [address]    Report Status PENDING  Incomplete  Culture, blood (x 2)     Status: None (Preliminary result)   Collection Time: 07/12/23  2:50 AM   Specimen: BLOOD RIGHT ARM  Result Value Ref Range Status   Specimen Description BLOOD RIGHT ARM  Final   Special Requests   Final    BOTTLES DRAWN AEROBIC AND ANAEROBIC Blood Culture results may not be optimal due to an excessive volume of blood received in culture bottles   Culture   Final    NO GROWTH 2 DAYS Performed at [facility], [address], [place], [place] [address]    Report Status PENDING  Incomplete  MRSA Next Gen by PCR, Nasal     Status: None   Collection Time: 07/12/23  7:00 AM   Specimen: Nasal Mucosa; Nasal Swab  Result Value Ref Range Status   MRSA by PCR Next Gen NOT DETECTED NOT DETECTED Final    Comment: (NOTE) The GeneXpert MRSA Assay  (FDA approved for NASAL specimens only), is one component of a comprehensive MRSA  colonization surveillance program. It is not intended to diagnose MRSA infection nor to guide or monitor treatment for MRSA infections. Test performance is not FDA approved in patients less than 25 years old. Performed at [facility], [address] [place], [place] [address]   Urine Culture (for pregnant, neutropenic or urologic patients or patients with an indwelling urinary catheter)     Status: Abnormal (Preliminary result)   Collection Time: 07/12/23  8:21 AM   Specimen: Urine, Clean Catch  Result Value Ref Range Status   Specimen Description URINE, CLEAN CATCH  Final   Special Requests NONE  Final   Culture (A)  Final    40,000 COLONIES/mL ESCHERICHIA COLI REPEAT SUSCEPTIBILITY Performed at [facility], [address], [place], [place] [address]    Report Status PENDING  Incomplete         Radiology Studies: ECHOCARDIOGRAM COMPLETE  Result Date: 07/13/2023    ECHOCARDIOGRAM REPORT   Patient Name:   BELL RAMONES Date of Exam: 07/13/2023 Medical Rec #:  Carter        Height:       67.0 in Accession #:    [id number]       Weight:       144.4 lb Date of Birth:  11/18/1941         BSA:          1.761 m Patient Age:    81 years         BP:           135/70 mmHg Patient Gender: M                HR:           57 bpm. Exam Location:  Inpatient Procedure: 2D Echo, Color Doppler and Cardiac Doppler Indications:    Sepsis (HCC)  History:        Patient has prior history of Echocardiogram examinations, most                 recent 02/17/2023. Risk Factors:Hypertension and Dyslipidemia.  Sonographer:    Harriette Bouillon RDCS Referring Phys: [id number] ALEXIS HUGELMEYER IMPRESSIONS  1. Left ventricular ejection fraction, by estimation, is 60 to 65%. The left ventricle has normal function. The left ventricle has no regional wall motion abnormalities. Left ventricular diastolic parameters were normal.  2.  Right ventricular systolic function is normal. The right ventricular size is normal.  3. Left atrial size was moderately dilated.  4. The mitral valve is normal in structure. Mild mitral valve regurgitation. No evidence of mitral stenosis.  5. The aortic valve is tricuspid. Aortic valve regurgitation is trivial. Aortic valve sclerosis/calcification is present, without any evidence of aortic stenosis.  6. The inferior vena cava is dilated in size with >50% respiratory variability, suggesting right atrial pressure of 8 mmHg. FINDINGS  Left Ventricle: Left ventricular ejection fraction, by estimation, is 60 to 65%. The left ventricle has normal function. The left ventricle has no regional wall motion abnormalities. The left ventricular internal cavity size was normal in size. There is  no left ventricular hypertrophy. Left ventricular diastolic parameters were normal. Right Ventricle: The right ventricular size is normal. Right ventricular systolic function is normal. Left Atrium: Left atrial size was moderately dilated. Right Atrium: Right atrial size was normal in size. Pericardium: There is no evidence of pericardial effusion. Mitral Valve: The mitral valve is normal in structure. Mild mitral valve regurgitation. No evidence of mitral  valve stenosis. Tricuspid Valve: The tricuspid valve is normal in structure. Tricuspid valve regurgitation is trivial. No evidence of tricuspid stenosis. Aortic Valve: The aortic valve is tricuspid. Aortic valve regurgitation is trivial. Aortic valve sclerosis/calcification is present, without any evidence of aortic stenosis. Aortic valve mean gradient measures 5.0 mmHg. Aortic valve peak gradient measures 9.4 mmHg. Aortic valve area, by VTI measures 2.51 cm. Pulmonic Valve: The pulmonic valve was not well visualized. Pulmonic valve regurgitation is not visualized. No evidence of pulmonic stenosis. Aorta: The aortic root is normal in size and structure. Venous: The inferior vena cava  is dilated in size with greater than 50% respiratory variability, suggesting right atrial pressure of 8 mmHg. IAS/Shunts: No atrial level shunt detected by color flow Doppler.  LEFT VENTRICLE PLAX 2D LVIDd:         4.00 cm   Diastology LVIDs:         3.10 cm   LV e' medial:    8.16 cm/s LV PW:         1.00 cm   LV E/e' medial:  13.2 LV IVS:        1.00 cm   LV e' lateral:   9.68 cm/s LVOT diam:     2.00 cm   LV E/e' lateral: 11.2 LV SV:         80 LV SV Index:   46 LVOT Area:     3.14 cm  RIGHT VENTRICLE             IVC RV S prime:     12.10 cm/s  IVC diam: 2.40 cm TAPSE (M-mode): 1.7 cm LEFT ATRIUM             Index        RIGHT ATRIUM           Index LA diam:        4.40 cm 2.50 cm/m   RA Area:     12.10 cm LA Vol (A2C):   96.4 ml 54.75 ml/m  RA Volume:   26.10 ml  14.82 ml/m LA Vol (A4C):   64.6 ml 36.69 ml/m LA Biplane Vol: 79.6 ml 45.21 ml/m  AORTIC VALVE AV Area (Vmax):    2.18 cm AV Area (Vmean):   2.42 cm AV Area (VTI):     2.51 cm AV Vmax:           153.00 cm/s AV Vmean:          97.500 cm/s AV VTI:            0.321 m AV Peak Grad:      9.4 mmHg AV Mean Grad:      5.0 mmHg LVOT Vmax:         106.00 cm/s LVOT Vmean:        75.200 cm/s LVOT VTI:          0.256 m LVOT/AV VTI ratio: 0.80  AORTA Ao Root diam: 3.50 cm Ao Asc diam:  3.30 cm MITRAL VALVE MV Area (PHT): 4.31 cm     SHUNTS MV Decel Time: 176 msec     Systemic VTI:  0.26 m MV E velocity: 108.00 cm/s  Systemic Diam: 2.00 cm MV A velocity: 79.20 cm/s MV E/A ratio:  1.36 Olga Millers MD Electronically signed by Olga Millers MD Signature Date/Time: 07/13/2023/2:39:06 PM    Final         Scheduled Meds:  acetaminophen  1,000 mg Oral Once   apixaban  2.5 mg Oral BID   atorvastatin  20 mg Oral QPM   levETIRAcetam  500 mg Oral BID   pantoprazole  40 mg Oral Daily   sodium chloride flush  3 mL Intravenous Once   sodium chloride flush  3 mL Intravenous Q12H   tamsulosin  0.4 mg Oral QPC breakfast   Continuous Infusions:   cefTRIAXone (ROCEPHIN)  IV 1 g (07/13/23 1118)     LOS: 3 days    Time spent: 52 minutes spent on chart review, discussion with nursing staff, consultants, updating family and interview/physical exam; more than 50% of that time was spent in counseling and/or coordination of care.    Alvira Philips [place], DO [facility] Available via Epic secure chat 7am-7pm After these hours, please refer to coverage provider listed on [email or web address] 07/14/2023, 10:10 AM

## 2023-07-14 NOTE — [place] (Signed)
[place] MEDICAID [place]2 LEVEL OF CARE FORM     IDENTIFICATION  Patient Name: Richard Carter Birthdate: [birth year] Sex: male Admission Date (Current Location): 07/11/2023  [facility] and [place] Number:  Chiropodist and Address:  [facility] [place], [address] [place], [place] [address]      Provider Number: [id number]  Attending Physician Name and Address:  [place], Alvira Philips, DO  Relative Name and Phone Number:       Current Level of Care: Hospital Recommended Level of Care: Skilled Nursing Facility Prior Approval Number:    Date Approved/Denied:   PASRR Number: [id number]  Discharge Plan: SNF    Current Diagnoses: Patient Active Problem List   Diagnosis Date Noted   Encephalopathy acute 07/11/2023   History of alcohol use disorder 05/31/2023   Goals of care, counseling/discussion 04/03/2023   Malnutrition of moderate degree 04/03/2023   Stroke (HCC) 03/27/2023   Stenosis of left carotid artery 02/20/2023   Altered mental status 02/20/2023   Acute stroke due to ischemia (HCC) 02/17/2023   Stroke-like symptom 02/16/2023   Leukocytosis 02/16/2023   Anemia of chronic disease 07/22/2022   Protein-calorie malnutrition, severe 05/28/2022   Dysphagia due to recent cerebrovascular accident 05/28/2022   Acute metabolic encephalopathy 05/27/2022   History of stroke 05/27/2022   Aspiration pneumonia (HCC) 04/23/2022   Nicotine dependence    Elevated troponin    Acute CVA (cerebrovascular accident) (HCC) 05/07/2021   Non compliance w medication regimen 05/07/2021   Dementia (HCC) 05/07/2021   AF (paroxysmal atrial fibrillation) (HCC) 05/07/2021   HTN (hypertension) 11/29/2020   Alcohol abuse 11/29/2020   AKI (acute kidney injury) (HCC) 11/29/2020   Atrial flutter with rapid ventricular response (HCC) 11/29/2020   Seizures (HCC) 02/22/2020   Hypomagnesemia 10/29/2019   Pubic ramus fracture (HCC) 07/29/2019   Acetabular fracture (HCC)  07/29/2019   HLD (hyperlipidemia) 07/29/2019   GERD (gastroesophageal reflux disease) 07/29/2019   UTI (urinary tract infection) 07/29/2019   Vitamin D deficiency 09/29/2015    Orientation RESPIRATION BLADDER Height & Weight     Self, Place  Normal Incontinent Weight: 144 lb 6.4 oz (65.5 kg) Height:  5\' 7"  (170.2 cm)  BEHAVIORAL SYMPTOMS/MOOD NEUROLOGICAL BOWEL NUTRITION STATUS      Incontinent Diet (see DC summary)  AMBULATORY STATUS COMMUNICATION OF NEEDS Skin   Limited Assist Verbally Skin abrasions                       Personal Care Assistance Level of Assistance  Bathing, Feeding, Dressing Bathing Assistance: Limited assistance Feeding assistance: Limited assistance Dressing Assistance: Limited assistance     Functional Limitations Info  Sight, Speech Sight Info: Impaired   Speech Info: Impaired (dysarthria)    SPECIAL CARE FACTORS FREQUENCY  PT (By licensed PT), OT (By licensed OT)     PT Frequency: 5x/wk OT Frequency: 5x/wk            Contractures Contractures Info: Not present    Additional Factors Info  Code Status, Allergies Code Status Info: DNR Allergies Info: NKA           Current Medications (07/14/2023):  This is the current hospital active medication list Current Facility-Administered Medications  Medication Dose Route Frequency Provider Last Rate Last Admin   acetaminophen (TYLENOL) tablet 650 mg  650 mg Oral Q6H PRN Hugelmeyer, Alexis, DO       Or   acetaminophen (TYLENOL) suppository 650 mg  650 mg Rectal  Q6H PRN Hugelmeyer, Alexis, DO       acetaminophen (TYLENOL) tablet 1,000 mg  1,000 mg Oral Once Dyanne Iha, MD       albuterol (PROVENTIL) (2.5 MG/3ML) 0.083% nebulizer solution 2.5 mg  2.5 mg Nebulization Q6H PRN Hugelmeyer, Alexis, DO       apixaban (ELIQUIS) tablet 5 mg  5 mg Oral BID [place], Eric J, DO       atorvastatin (LIPITOR) tablet 20 mg  20 mg Oral QPM Hugelmeyer, Alexis, DO   20 mg at 07/13/23 1747    bisacodyl (DULCOLAX) EC tablet 5 mg  5 mg Oral Daily PRN Hugelmeyer, Alexis, DO       cefTRIAXone (ROCEPHIN) 1 g in sodium chloride 0.9 % 100 mL IVPB  1 g Intravenous Q24H [place], Alvira Philips, DO 200 mL/hr at 07/14/23 1219 1 g at 07/14/23 1219   hydrALAZINE (APRESOLINE) injection 5 mg  5 mg Intravenous Q8H PRN Hugelmeyer, Alexis, DO       HYDROcodone-acetaminophen (NORCO/VICODIN) 5-325 MG per tablet 1-2 tablet  1-2 tablet Oral Q4H PRN Hugelmeyer, Alexis, DO       ipratropium (ATROVENT) nebulizer solution 0.5 mg  0.5 mg Nebulization Q6H PRN Hugelmeyer, Alexis, DO       levETIRAcetam (KEPPRA) tablet 500 mg  500 mg Oral BID [place], Eric J, DO   500 mg at 07/14/23 1011   morphine (PF) 2 MG/ML injection 1 mg  1 mg Intravenous Q6H PRN Hugelmeyer, Alexis, DO       ondansetron (ZOFRAN) tablet 4 mg  4 mg Oral Q6H PRN Hugelmeyer, Alexis, DO       Or   ondansetron (ZOFRAN) injection 4 mg  4 mg Intravenous Q6H PRN Hugelmeyer, Alexis, DO       pantoprazole (PROTONIX) EC tablet 40 mg  40 mg Oral Daily Hugelmeyer, Alexis, DO   40 mg at 07/14/23 1011   senna-docusate (Senokot-S) tablet 1 tablet  1 tablet Oral QHS PRN Hugelmeyer, Alexis, DO       sodium chloride flush (NS) 0.9 % injection 3 mL  3 mL Intravenous Once Margarita Grizzle, MD       sodium chloride flush (NS) 0.9 % injection 3 mL  3 mL Intravenous Q12H Hugelmeyer, Alexis, DO   3 mL at 07/14/23 1012   tamsulosin (FLOMAX) capsule 0.4 mg  0.4 mg Oral QPC breakfast Hugelmeyer, Alexis, DO   0.4 mg at 07/14/23 1011   traZODone (DESYREL) tablet 25 mg  25 mg Oral QHS PRN Hugelmeyer, Alexis, DO   25 mg at 07/12/23 2106     Discharge Medications: Please see discharge summary for a list of discharge medications.  Relevant Imaging Results:  Relevant Lab Results:   Additional Information SS#: [id number]  Baldemar Lenis, LCSW

## 2023-07-15 DIAGNOSIS — G9340 Encephalopathy, unspecified: Secondary | ICD-10-CM | POA: Diagnosis not present

## 2023-07-15 LAB — BASIC METABOLIC PANEL
Anion gap: 10 (ref 5–15)
BUN: 16 mg/dL (ref 8–23)
CO2: 19 mmol/L — ABNORMAL LOW (ref 22–32)
Calcium: 9.2 mg/dL (ref 8.9–10.3)
Chloride: 105 mmol/L (ref 98–111)
Creatinine, Ser: 1.09 mg/dL (ref 0.61–1.24)
GFR, Estimated: >60 mL/min (ref >60)
Glucose, Bld: 102 mg/dL — ABNORMAL HIGH (ref 70–99)
Potassium: 4.2 mmol/L (ref 3.5–5.1)
Sodium: 134 mmol/L — ABNORMAL LOW (ref 135–145)

## 2023-07-15 LAB — URINE CULTURE: Culture: 40000 — AB

## 2023-07-15 LAB — MAGNESIUM: Magnesium: 1.6 mg/dL — ABNORMAL LOW (ref 1.7–2.4)

## 2023-07-15 MED ORDER — SODIUM CHLORIDE 0.9 % IV SOLN
1.0000 g | INTRAVENOUS | Status: AC
  Administered 2023-07-15 – 2023-07-17 (×3): 1 g via INTRAVENOUS
  Filled 2023-07-15 (×3): qty 1000

## 2023-07-15 NOTE — Progress Notes (Signed)
PROGRESS NOTE    Richard Carter  ZHY:[id number] DOB: [birth year] DOA: 07/11/2023 PCP: Marisue Ivan, MD    Brief Narrative:   Richard Carter is a 82 y.o. male with past medical history significant for CVA, seizure disorder, mixed Alzheimer's and vascular dementia, persistent atrial fibrillation on Eliquis, essential hypertension, hyperlipidemia, carotid artery stenosis s/p left CEA, remote EtOH abuse, CKD stage IIIa, COPD, tobacco use disorder, medical noncompliance who presented to [facility] ED on 8/16 after being found down approximately 3 PM for unknown amount of time by his neighbor.  Last known well at 12 PM same day.  Patient was laying in the sun altered/unconscious.  EMS was called and patient was brought to the ED for further evaluation.  On arrival to the ED, patient was noted to be disoriented, garbled speech with mild right-sided weakness.  Initially on 10 L nonrebreather via EMS but was quickly titrated down.  Code stroke was initiated.  Temperature 102.1 F, HR 89, RR 26, BP 105/56, SpO2 94% on 4 L nasal cannula.  Sodium 138, potassium 3.2, chloride 109, glucose 150, BUN 21, creatinine 2.10.  Ionized calcium 1.21.  Hemoglobin 9.9.  EtOH level less than 10.  TSH 1.844.  INR 2.4.  Vitamin B12 671, lactic acid 1.0.  Chest x-ray with no acute cardiopulmonary disease findings.  COVID/flu/RSV PCR negative.  CT head without contrast with no acute intracranial abnormality.  CT angiogram head/neck with no emergent vascular finding, noted left carotid endarterectomy with widely patent internal carotid artery.  MRI brain without contrast with no acute reversible finding, prominent atrophy and chronic ischemic injury.  Neurology was consulted, patient was given loading dose of IV Keppra 2 g.  TRH consulted for admission for further evaluation and management of acute metabolic encephalopathy concerning for breakthrough seizure, urinary tract infection.  Assessment & Plan:   Acute  metabolic encephalopathy: Resolved Hx seizure disorder with concern for breakthrough seizure Patient presenting to ED after being found down by a bystander for unknown known amount of time.  Patient was found on the ground on the front porch under direct sunshine unconscious.  Patient with known history of seizure disorder, apparently noncompliant with Keppra.  Patient is noted to be febrile, although laying in the sun for likely several hours.  Potassium slightly low, EtOH level less than 10, TSH, vitamin B12 within normal limits.  Lactic acid within normal limits.  Chest x-ray unrevealing.  CT head, CT angiogram head/neck and MRI brain with no acute findings.  Patient was given 2 g IV Keppra loading dose.  EEG with mild diffuse encephalopathy no seizures or epileptiform discharges noted.  Neurology was initially consulted and now signed off. -- Keppra 500mg  PO BID -- Continue therapy efforts while inpatient, will need SNF placement  Hypokalemia: Resolved Potassium 3.2 on admission, repleted.  3.9 this morning.  Hypomagnesemia: Resolved Repleted, magnesium 2.0 this morning.  ESBL E. coli urinary tract infection Patient with history of multiple E. coli UTIs in the past on EMR review.  Initially started on empiric antibiotics with vancomycin, cefepime and metronidazole.  Previous urine cultures with susceptibility to ceftriaxone.  Urinalysis with large leukocytes, positive nitrite, rare bacteria, greater than 50 WBCs. Urine culture: + >40K ESBL coli. -- Blood cultures x 2: NG x  days -- Change ceftriaxone to ertapenem given urine culture susceptibilities/ESBL; plan 3-day course -- Contact precautions  Hyperthermia likely secondary to environmental exposure: resolved Temperature 102.1 F on ED arrival, likely secondary to environmental exposure as being  found down outside under the sun/heat.  Temperature has now normalized.  Hx CVA CT head, MRI brain negative for acute finding. -- Atorvastatin  20 mg p.o. daily -- Eliquis 5 mg p.o. twice daily  Mixed Alzheimer's and vascular dementia --Delirium precautions --Get up during the day --Encourage a familiar face to remain present throughout the day --Keep blinds open and lights on during daylight hours --Minimize the use of opioids/benzodiazepines  Dysphagia Seen by speech therapy.  Recommend dysphagia 1, pured diet with nectar thick liquid  Persistent atrial fibrillation Essential hypertension Blood pressure 135/64 this morning, continue to hold home amlodipine. -- Eliquis 5mg  PO BID -- Monitor on telemetry  Hyperlipidemia -- Atorvastatin 20 mg p.o. daily  Hx carotid artery stenosis  s/p left CEA  CKD stage IIIa Creatinine 1.90>1.30, stable -- Avoid nephrotoxins, renally dose all medications -- BMP in the a.m.  COPD Not oxygen dependent at baseline, currently not utilizing any controlling inhalers at home. -- Albuterol neb every 6 hours as needed wheezing/shortness of breath  BPH: -- Tamsulosin 0.4 mg p.o. daily  Remote EtOH abuse Currently denies  Medical noncompliance Complicates all facets of care  Weakness/debility/deconditioning/gait disturbance: Seen by PT/OT, currently recommending home health on discharge.  DVT prophylaxis: SCDs Start: 07/12/23 0159 apixaban (ELIQUIS) tablet 5 mg    Code Status: DNR Family Communication: No family present at bedside this morning  Disposition Plan:  Level of care: Med-Surg Status is: Inpatient Remains inpatient appropriate because: IV antibiotics, will need SNF placement, TOC following  Consultants:  Neurology  Procedures:  EEG  Antimicrobials:  Vancomycin 8/16 - 8/16 Cefepime 8/16 - 8/16 Metronidazole 8/16 - 8/16 Ceftriaxone 8/17 - 8/19 Ertapenem 8/20>>   Subjective: Patient seen examined bedside, resting comfortably.  Sitting on edge of bed, nurse tech present obtaining orthostatic vital signs.  Urine culture susceptibilities returned notable for  ESBL E. coli, changing ceftriaxone to ertapenem today.  Pending SNF placement. No specific questions or concerns at this time.  Denies headache, no visual changes, no chest pain, no palpitations, no shortness of breath, no cough/congestion, no fever/chills/night sweats, no nausea/vomiting/diarrhea, no focal weakness, no fatigue, no paresthesias.  No acute events or other concerns overnight per nursing staff.  Objective: Vitals:   07/14/23 2022 07/14/23 2339 07/15/23 0400 07/15/23 0817  BP: (!) 141/68 139/73 (!) 141/76 (!) 140/72  Pulse: 63 63 (!) 58 (!) 58  Resp: 16 17 16 18   Temp: 99.1 F (37.3 C) 98.6 F (37 C) 98.2 F (36.8 C) 98.2 F (36.8 C)  TempSrc: Oral Oral Oral Oral  SpO2: 95% 95% 97% 95%  Weight:      Height:        Intake/Output Summary (Last 24 hours) at 07/15/2023 1026 Last data filed at 07/15/2023 0818 Gross per 24 hour  Intake 120 ml  Output 2600 ml  Net -2480 ml   Filed Weights   07/11/23 1500 07/11/23 1555 07/12/23 0205  Weight: 65.2 kg 65.2 kg 65.5 kg    Examination:  Physical Exam: GEN: NAD, alert and oriented x 3, chronically ill/frail in appearance, appears older than stated age HEENT: NCAT, PERRL, EOMI, sclera clear, dry mucous membranes PULM: CTAB w/o wheezes/crackles, normal respiratory effort, on room air CV: RRR w/o M/G/R GI: abd soft, NTND, NABS, no R/G/M MSK: no peripheral edema, muscle strength globally intact 5/5 bilateral upper/lower extremities NEURO: Patient with noted abnormal depth perception, otherwise CN II-XII intact, no other focal deficits, sensation to light touch intact PSYCH: Depressed mood, flat affect  Data Reviewed: I have personally reviewed following labs and imaging studies  CBC: Recent Labs  Lab 07/11/23 1540 07/11/23 1725 07/12/23 0246 07/13/23 0407  WBC  --  11.2* 10.9* 9.8  NEUTROABS  --  9.4* 8.4*  --   HGB 9.9* 9.8* 9.2* 8.8*  HCT 29.0* 29.3* 28.0* 27.0*  MCV  --  90.4 88.6 88.5  PLT  --  223 236 230    Basic Metabolic Panel: Recent Labs  Lab 07/11/23 1725 07/12/23 0246 07/13/23 0407 07/14/23 0438 07/14/23 2357  NA 134* 136 131* 132* 134*  K 3.0* 3.5 3.5 3.9 4.2  CL 108 110 106 107 105  CO2 15* 17* 16* 17* 19*  GLUCOSE 138* 103* 91 98 102*  BUN 22 21 15 16 16   CREATININE 2.01* 1.90* 1.30* 1.14 1.09  CALCIUM 8.7* 8.5* 8.6* 9.0 9.2  MG  --  1.3* 1.1* 2.0 1.6*  PHOS  --  2.9  --   --   --    GFR: Estimated Creatinine Clearance: 47.6 mL/min (by C-G formula based on SCr of 1.09 mg/dL). Liver Function Tests: Recent Labs  Lab 07/11/23 1725 07/12/23 0246  AST 16 73*  ALT 13 21  ALKPHOS 71 68  BILITOT 0.6 0.3  PROT 6.3* 6.3*  ALBUMIN 2.8* 2.9*   No results for input(s): "LIPASE", "AMYLASE" in the last 168 hours. Recent Labs  Lab 07/11/23 1704  AMMONIA 20   Coagulation Profile: Recent Labs  Lab 07/11/23 1725  INR 2.4*   Cardiac Enzymes: Recent Labs  Lab 07/11/23 1725  CKTOTAL 156   BNP (last 3 results) No results for input(s): "PROBNP" in the last 8760 hours. HbA1C: No results for input(s): "HGBA1C" in the last 72 hours. CBG: Recent Labs  Lab 07/11/23 1535  GLUCAP 130*   Lipid Profile: No results for input(s): "CHOL", "HDL", "LDLCALC", "TRIG", "CHOLHDL", "LDLDIRECT" in the last 72 hours. Thyroid Function Tests: No results for input(s): "TSH", "T4TOTAL", "FREET4", "T3FREE", "THYROIDAB" in the last 72 hours.  Anemia Panel: No results for input(s): "VITAMINB12", "FOLATE", "FERRITIN", "TIBC", "IRON", "RETICCTPCT" in the last 72 hours.  Sepsis Labs: Recent Labs  Lab 07/12/23 0238 07/12/23 0456  PROCALCITON 17.61  --   LATICACIDVEN  --  1.0    Recent Results (from the past 240 hour(s))  Blood culture (routine x 2)     Status: None (Preliminary result)   Collection Time: 07/11/23  5:04 PM   Specimen: BLOOD  Result Value Ref Range Status   Specimen Description BLOOD SITE NOT SPECIFIED  Final   Special Requests   Final    BOTTLES DRAWN AEROBIC  AND ANAEROBIC Blood Culture adequate volume   Culture   Final    NO GROWTH 4 DAYS Performed at [facility], [address] [place], [place] [address]    Report Status PENDING  Incomplete  Blood culture (routine x 2)     Status: None (Preliminary result)   Collection Time: 07/11/23  5:11 PM   Specimen: BLOOD  Result Value Ref Range Status   Specimen Description BLOOD SITE NOT SPECIFIED  Final   Special Requests   Final    BOTTLES DRAWN AEROBIC AND ANAEROBIC Blood Culture adequate volume   Culture   Final    NO GROWTH 4 DAYS Performed at [facility], [address], [place], [place] [address]    Report Status PENDING  Incomplete  Resp panel by RT-PCR (RSV, Flu A&B, Covid) Anterior Nasal Swab  Status: None   Collection Time: 07/11/23  8:25 PM   Specimen: Anterior Nasal Swab  Result Value Ref Range Status   SARS Coronavirus 2 by RT PCR NEGATIVE NEGATIVE Final   Influenza A by PCR NEGATIVE NEGATIVE Final   Influenza B by PCR NEGATIVE NEGATIVE Final    Comment: (NOTE) The Xpert Xpress SARS-CoV-2/FLU/RSV plus assay is intended as an aid in the diagnosis of influenza from Nasopharyngeal swab specimens and should not be used as a sole basis for treatment. Nasal washings and aspirates are unacceptable for Xpert Xpress SARS-CoV-2/FLU/RSV testing.  Fact Sheet for Patients: BloggerCourse.com  Fact Sheet for Healthcare Providers: SeriousBroker.it  This test is not yet approved or cleared by the [place] FDA and has been authorized for detection and/or diagnosis of SARS-CoV-2 by FDA under an Emergency Use Authorization (EUA). This EUA will remain in effect (meaning this test can be used) for the duration of the COVID-19 declaration under Section 564(b)(1) of the Act, 21 U.S.C. section 360bbb-3(b)(1), unless the authorization is terminated or revoked.     Resp Syncytial Virus by PCR NEGATIVE NEGATIVE Final     Comment: (NOTE) Fact Sheet for Patients: BloggerCourse.com  Fact Sheet for Healthcare Providers: SeriousBroker.it  This test is not yet approved or cleared by the [place] FDA and has been authorized for detection and/or diagnosis of SARS-CoV-2 by FDA under an Emergency Use Authorization (EUA). This EUA will remain in effect (meaning this test can be used) for the duration of the COVID-19 declaration under Section 564(b)(1) of the Act, 21 U.S.C. section 360bbb-3(b)(1), unless the authorization is terminated or revoked.  Performed at [facility], [address], [place], [place] [address]   Culture, blood (x 2)     Status: None (Preliminary result)   Collection Time: 07/12/23  2:40 AM   Specimen: BLOOD RIGHT HAND  Result Value Ref Range Status   Specimen Description BLOOD RIGHT HAND  Final   Special Requests   Final    BOTTLES DRAWN AEROBIC AND ANAEROBIC Blood Culture adequate volume   Culture   Final    NO GROWTH 3 DAYS Performed at [facility], [address], [place], [place] [address]    Report Status PENDING  Incomplete  Culture, blood (x 2)     Status: None (Preliminary result)   Collection Time: 07/12/23  2:50 AM   Specimen: BLOOD RIGHT ARM  Result Value Ref Range Status   Specimen Description BLOOD RIGHT ARM  Final   Special Requests   Final    BOTTLES DRAWN AEROBIC AND ANAEROBIC Blood Culture results may not be optimal due to an excessive volume of blood received in culture bottles   Culture   Final    NO GROWTH 3 DAYS Performed at [facility], [address], [place], [place] [address]    Report Status PENDING  Incomplete  MRSA Next Gen by PCR, Nasal     Status: None   Collection Time: 07/12/23  7:00 AM   Specimen: Nasal Mucosa; Nasal Swab  Result Value Ref Range Status   MRSA by PCR Next Gen NOT DETECTED NOT DETECTED Final    Comment: (NOTE) The GeneXpert MRSA Assay (FDA approved for  NASAL specimens only), is one component of a comprehensive MRSA colonization surveillance program. It is not intended to diagnose MRSA infection nor to guide or monitor treatment for MRSA infections. Test performance is not FDA approved in patients less than 24 years old. Performed at  [facility], [address], [place], [place] [address]   Urine Culture (for pregnant, neutropenic or urologic patients or patients with an indwelling urinary catheter)     Status: Abnormal   Collection Time: 07/12/23  8:21 AM   Specimen: Urine, Clean Catch  Result Value Ref Range Status   Specimen Description URINE, CLEAN CATCH  Final   Special Requests   Final    NONE Performed at [facility], [address], [place], [place] [address]    Culture (A)  Final    40,000 COLONIES/mL ESCHERICHIA COLI Confirmed Extended Spectrum Beta-Lactamase Producer (ESBL).  In bloodstream infections from ESBL organisms, carbapenems are preferred over piperacillin/tazobactam. They are shown to have a lower risk of mortality.    Report Status 07/15/2023 FINAL  Final   Organism ID, Bacteria ESCHERICHIA COLI (A)  Final      Susceptibility   Escherichia coli - MIC*    AMPICILLIN >=32 RESISTANT Resistant     CEFAZOLIN >=64 RESISTANT Resistant     CEFEPIME 0.5 SENSITIVE Sensitive     CEFTRIAXONE 16 RESISTANT Resistant     CIPROFLOXACIN >=4 RESISTANT Resistant     GENTAMICIN <=1 SENSITIVE Sensitive     IMIPENEM <=0.25 SENSITIVE Sensitive     NITROFURANTOIN 128 RESISTANT Resistant     TRIMETH/SULFA >=320 RESISTANT Resistant     AMPICILLIN/SULBACTAM 16 INTERMEDIATE Intermediate     PIP/TAZO <=4 SENSITIVE Sensitive     * 40,000 COLONIES/mL ESCHERICHIA COLI         Radiology Studies: ECHOCARDIOGRAM COMPLETE  Result Date: 07/13/2023    ECHOCARDIOGRAM REPORT   Patient Name:   Richard Carter Date of Exam: 07/13/2023 Medical Rec #:  [id number]        Height:       67.0 in Accession #:    [id number]        Weight:       144.4 lb Date of Birth:  1941-07-02         BSA:          1.761 m Patient Age:    81 years         BP:           135/70 mmHg Patient Gender: M                HR:           57 bpm. Exam Location:  Inpatient Procedure: 2D Echo, Color Doppler and Cardiac Doppler Indications:    Sepsis (HCC)  History:        Patient has prior history of Echocardiogram examinations, most                 recent 02/17/2023. Risk Factors:Hypertension and Dyslipidemia.  Sonographer:    Harriette Bouillon RDCS Referring Phys: [id number] ALEXIS HUGELMEYER IMPRESSIONS  1. Left ventricular ejection fraction, by estimation, is 60 to 65%. The left ventricle has normal function. The left ventricle has no regional wall motion abnormalities. Left ventricular diastolic parameters were normal.  2. Right ventricular systolic function is normal. The right ventricular size is normal.  3. Left atrial size was moderately dilated.  4. The mitral valve is normal in structure. Mild mitral valve regurgitation. No evidence of mitral stenosis.  5. The aortic valve is tricuspid. Aortic valve regurgitation is trivial. Aortic valve sclerosis/calcification is present, without any evidence of aortic stenosis.  6. The inferior vena cava is dilated in size with >50% respiratory variability, suggesting right  atrial pressure of 8 mmHg. FINDINGS  Left Ventricle: Left ventricular ejection fraction, by estimation, is 60 to 65%. The left ventricle has normal function. The left ventricle has no regional wall motion abnormalities. The left ventricular internal cavity size was normal in size. There is  no left ventricular hypertrophy. Left ventricular diastolic parameters were normal. Right Ventricle: The right ventricular size is normal. Right ventricular systolic function is normal. Left Atrium: Left atrial size was moderately dilated. Right Atrium: Right atrial size was normal in size. Pericardium: There is no evidence of pericardial effusion. Mitral Valve: The mitral  valve is normal in structure. Mild mitral valve regurgitation. No evidence of mitral valve stenosis. Tricuspid Valve: The tricuspid valve is normal in structure. Tricuspid valve regurgitation is trivial. No evidence of tricuspid stenosis. Aortic Valve: The aortic valve is tricuspid. Aortic valve regurgitation is trivial. Aortic valve sclerosis/calcification is present, without any evidence of aortic stenosis. Aortic valve mean gradient measures 5.0 mmHg. Aortic valve peak gradient measures 9.4 mmHg. Aortic valve area, by VTI measures 2.51 cm. Pulmonic Valve: The pulmonic valve was not well visualized. Pulmonic valve regurgitation is not visualized. No evidence of pulmonic stenosis. Aorta: The aortic root is normal in size and structure. Venous: The inferior vena cava is dilated in size with greater than 50% respiratory variability, suggesting right atrial pressure of 8 mmHg. IAS/Shunts: No atrial level shunt detected by color flow Doppler.  LEFT VENTRICLE PLAX 2D LVIDd:         4.00 cm   Diastology LVIDs:         3.10 cm   LV e' medial:    8.16 cm/s LV PW:         1.00 cm   LV E/e' medial:  13.2 LV IVS:        1.00 cm   LV e' lateral:   9.68 cm/s LVOT diam:     2.00 cm   LV E/e' lateral: 11.2 LV SV:         80 LV SV Index:   46 LVOT Area:     3.14 cm  RIGHT VENTRICLE             IVC RV S prime:     12.10 cm/s  IVC diam: 2.40 cm TAPSE (M-mode): 1.7 cm LEFT ATRIUM             Index        RIGHT ATRIUM           Index LA diam:        4.40 cm 2.50 cm/m   RA Area:     12.10 cm LA Vol (A2C):   96.4 ml 54.75 ml/m  RA Volume:   26.10 ml  14.82 ml/m LA Vol (A4C):   64.6 ml 36.69 ml/m LA Biplane Vol: 79.6 ml 45.21 ml/m  AORTIC VALVE AV Area (Vmax):    2.18 cm AV Area (Vmean):   2.42 cm AV Area (VTI):     2.51 cm AV Vmax:           153.00 cm/s AV Vmean:          97.500 cm/s AV VTI:            0.321 m AV Peak Grad:      9.4 mmHg AV Mean Grad:      5.0 mmHg LVOT Vmax:         106.00 cm/s LVOT Vmean:        75.200 cm/s  LVOT VTI:          0.256 m LVOT/AV VTI ratio: 0.80  AORTA Ao Root diam: 3.50 cm Ao Asc diam:  3.30 cm MITRAL VALVE MV Area (PHT): 4.31 cm     SHUNTS MV Decel Time: 176 msec     Systemic VTI:  0.26 m MV E velocity: 108.00 cm/s  Systemic Diam: 2.00 cm MV A velocity: 79.20 cm/s MV E/A ratio:  1.36 Olga Millers MD Electronically signed by Olga Millers MD Signature Date/Time: 07/13/2023/2:39:06 PM    Final         Scheduled Meds:  acetaminophen  1,000 mg Oral Once   apixaban  5 mg Oral BID   atorvastatin  20 mg Oral QPM   levETIRAcetam  500 mg Oral BID   pantoprazole  40 mg Oral Daily   sodium chloride flush  3 mL Intravenous Once   sodium chloride flush  3 mL Intravenous Q12H   tamsulosin  0.4 mg Oral QPC breakfast   Continuous Infusions:  ertapenem       LOS: 4 days    Time spent: 52 minutes spent on chart review, discussion with nursing staff, consultants, updating family and interview/physical exam; more than 50% of that time was spent in counseling and/or coordination of care.    Alvira Philips [place], DO [facility] Available via Epic secure chat 7am-7pm After these hours, please refer to coverage provider listed on [email or web address] 07/15/2023, 10:26 AM

## 2023-07-15 NOTE — TOC Progression Note (Signed)
Transition of Care [facility]) - Progression Note    Patient Details  Name: Richard Carter MRN: [id number] Date of Birth: 03/28/1941  [facility] CM/SW Contact  Baldemar [place], [place] Phone Number: 07/15/2023, 4:35 PM  Clinical Narrative:   CSW spoke with POA [name] about bed offers. [name] asked for time to discuss with family and call CSW back, then called CSW back this afternoon to choose [facility]. CSW confirmed with [facility] that bed is available and asked CMA to initiate insurance authorization. CSW to follow.    Expected Discharge Plan: Skilled Nursing Facility Barriers to Discharge: Continued Medical Work up, English as a second language teacher, SNF Pending bed offer  Expected Discharge Plan and Services     Post Acute Care Choice: Skilled Nursing Facility Living arrangements for the past 2 months: Single Family Home                                       Social Determinants of Health (SDOH) Interventions SDOH Screenings   Food Insecurity: No Food Insecurity (07/12/2023)  Housing: Patient Declined (07/12/2023)  Transportation Needs: No Transportation Needs (07/12/2023)  Utilities: Not At Risk (07/12/2023)  Financial Resource Strain: Low Risk  (07/08/2023)   Received from [facility]  Tobacco Use: Medium Risk (07/11/2023)    Readmission Risk Interventions    04/21/2022    1:55 PM  Readmission Risk Prevention Plan  Post Dischage Appt Complete  Medication Screening Complete  Transportation Screening Complete

## 2023-07-15 NOTE — Plan of Care (Signed)
  Problem: Respiratory: Goal: Ability to maintain adequate ventilation will improve Outcome: Progressing   Problem: Education: Goal: Knowledge of General Education information will improve Description: Including pain rating scale, medication(s)/side effects and non-pharmacologic comfort measures Outcome: Progressing   Problem: Nutrition: Goal: Adequate nutrition will be maintained Outcome: Progressing   Problem: Activity: Goal: Risk for activity intolerance will decrease Outcome: Progressing   Problem: Coping: Goal: Level of anxiety will decrease Outcome: Progressing   Problem: Elimination: Goal: Will not experience complications related to bowel motility Outcome: Progressing Goal: Will not experience complications related to urinary retention Outcome: Progressing   Problem: Safety: Goal: Ability to remain free from injury will improve Outcome: Progressing   Problem: Skin Integrity: Goal: Risk for impaired skin integrity will decrease Outcome: Progressing

## 2023-07-16 DIAGNOSIS — R569 Unspecified convulsions: Secondary | ICD-10-CM

## 2023-07-16 DIAGNOSIS — E876 Hypokalemia: Secondary | ICD-10-CM | POA: Insufficient documentation

## 2023-07-16 DIAGNOSIS — B9629 Other Escherichia coli [E. coli] as the cause of diseases classified elsewhere: Secondary | ICD-10-CM

## 2023-07-16 DIAGNOSIS — Z8673 Personal history of transient ischemic attack (TIA), and cerebral infarction without residual deficits: Secondary | ICD-10-CM

## 2023-07-16 DIAGNOSIS — F1010 Alcohol abuse, uncomplicated: Secondary | ICD-10-CM

## 2023-07-16 DIAGNOSIS — G9340 Encephalopathy, unspecified: Secondary | ICD-10-CM | POA: Diagnosis not present

## 2023-07-16 DIAGNOSIS — Z1612 Extended spectrum beta lactamase (ESBL) resistance: Secondary | ICD-10-CM

## 2023-07-16 DIAGNOSIS — F0390 Unspecified dementia without behavioral disturbance: Secondary | ICD-10-CM

## 2023-07-16 DIAGNOSIS — N390 Urinary tract infection, site not specified: Secondary | ICD-10-CM

## 2023-07-16 DIAGNOSIS — I10 Essential (primary) hypertension: Secondary | ICD-10-CM

## 2023-07-16 LAB — CULTURE, BLOOD (ROUTINE X 2)
Culture: NO GROWTH
Culture: NO GROWTH
Special Requests: ADEQUATE
Special Requests: ADEQUATE

## 2023-07-16 LAB — BASIC METABOLIC PANEL
Anion gap: 8 (ref 5–15)
BUN: 12 mg/dL (ref 8–23)
CO2: 21 mmol/L — ABNORMAL LOW (ref 22–32)
Calcium: 9.2 mg/dL (ref 8.9–10.3)
Chloride: 107 mmol/L (ref 98–111)
Creatinine, Ser: 1.2 mg/dL (ref 0.61–1.24)
GFR, Estimated: >60 mL/min (ref >60)
Glucose, Bld: 93 mg/dL (ref 70–99)
Potassium: 3.8 mmol/L (ref 3.5–5.1)
Sodium: 136 mmol/L (ref 135–145)

## 2023-07-16 LAB — PHOSPHORUS: Phosphorus: 3 mg/dL (ref 2.5–4.6)

## 2023-07-16 LAB — MAGNESIUM: Magnesium: 1.5 mg/dL — ABNORMAL LOW (ref 1.7–2.4)

## 2023-07-16 MED ORDER — MAGNESIUM OXIDE -MG SUPPLEMENT 400 (240 MG) MG PO TABS
400.0000 mg | ORAL_TABLET | Freq: Two times a day (BID) | ORAL | Status: DC
  Administered 2023-07-16 – 2023-07-18 (×5): 400 mg via ORAL
  Filled 2023-07-16 (×5): qty 1

## 2023-07-16 MED ORDER — MAGNESIUM SULFATE 2 GM/50ML IV SOLN
2.0000 g | Freq: Once | INTRAVENOUS | Status: AC
  Administered 2023-07-16: 2 g via INTRAVENOUS
  Filled 2023-07-16: qty 50

## 2023-07-16 NOTE — TOC Progression Note (Signed)
Transition of Care [facility]) - Progression Note    Patient Details  Name: [name] MRN: [id number] Date of Birth: 16-Sep-1941  Transition of Care [facility]) CM/SW Contact  Baldemar Lenis, [place] Phone Number: 07/16/2023, 2:42 PM  Clinical Narrative:   CSW received update from [facility] that they are now in network with [facility], can accept the patient. CSW updated [name] who would like to change to [facility]. Authorization is still pending, request has been sent to MD for review, awaiting decision. CSW to follow.    Expected Discharge Plan: Skilled Nursing Facility Barriers to Discharge: Continued Medical Work up, English as a second language teacher, SNF Pending bed offer  Expected Discharge Plan and Services     Post Acute Care Choice: Skilled Nursing Facility Living arrangements for the past 2 months: Single Family Home                                       Social Determinants of Health (SDOH) Interventions SDOH Screenings   Food Insecurity: No Food Insecurity (07/12/2023)  Housing: Patient Declined (07/12/2023)  Transportation Needs: No Transportation Needs (07/12/2023)  Utilities: Not At Risk (07/12/2023)  Financial Resource Strain: Low Risk  (07/08/2023)   Received from [facility]  Tobacco Use: Medium Risk (07/11/2023)    Readmission Risk Interventions    04/21/2022    1:55 PM  Readmission Risk Prevention Plan  Post Dischage Appt Complete  Medication Screening Complete  Transportation Screening Complete

## 2023-07-16 NOTE — Progress Notes (Addendum)
Physical Therapy Treatment Patient Details Name: Richard Carter MRN: [id number] DOB: September 21, 1941 Today's Date: 07/16/2023   History of Present Illness 82 y.o. male presents to the emergency department 8/16 for evaluation of code stroke.  Patient was reportedly found altered and down on his front porch lying in the sun. +ESBL coli 8/20. PMHx: remote alcohol abuse, dementia, GERD, hypertension, hyperlipidemia, seizures, ischemic CVA, atrial fibrillation on Eliquis.    PT Comments  Mobilizing grossly at a min assist level. Showing some improved initiation and was able to scoot to EOB and descend into recliner with more control today. Still fatigues rapidly, limiting greater distances with gait. Educated on posture, RW control and gait symmetry for safety and efficiency. Tolerated LE exercises well, encouraged to perform routinely between therapy visits to minimize atrophy while less mobile. Patient will continue to benefit from skilled physical therapy services to further improve independence with functional mobility.    If plan is discharge home, recommend the following: A little help with walking and/or transfers;A little help with bathing/dressing/bathroom;Assistance with cooking/housework;Direct supervision/assist for medications management;Direct supervision/assist for financial management;Assist for transportation;Help with stairs or ramp for entrance;Supervision due to cognitive status;Assistance with feeding   Can travel by private vehicle     Yes  Equipment Recommendations  None recommended by PT    Recommendations for Other Services       Precautions / Restrictions Precautions Precautions: Fall Restrictions Weight Bearing Restrictions: No     Mobility  Bed Mobility Overal bed mobility: Needs Assistance Bed Mobility: Supine to Sit     Supine to sit: Min assist     General bed mobility comments: Min assist to rise to EOB, pulls through therapist hand, Able to scoot hips to  EOB today with VC only.    Transfers Overall transfer level: Needs assistance Equipment used: Rolling walker (2 wheels) Transfers: Sit to/from Stand Sit to Stand: Min assist           General transfer comment: Min assist for boost, cues for hand placement and technique. Improved control with descent into chair but still needs cues to reach back for safety.    Ambulation/Gait Ambulation/Gait assistance: Min assist Gait Distance (Feet): 40 Feet Assistive device: Rolling walker (2 wheels) Gait Pattern/deviations: Step-through pattern, Decreased stride length, Shuffle, Trunk flexed Gait velocity: decr Gait velocity interpretation: <1.31 ft/sec, indicative of household ambulator   General Gait Details: Gradual increase in distance. Pt fatigues quickly however and does not feel he can progress further than distance covered today. Min assist for RW control, light balance. Utilizing RW with VC for upright posture, proximity to device and step symmetry. No buckling noted.   Stairs             Wheelchair Mobility     Tilt Bed    Modified Rankin (Stroke Patients Only)       Balance Overall balance assessment: Needs assistance Sitting-balance support: No upper extremity supported, Feet supported Sitting balance-Leahy Scale: Fair     Standing balance support: Bilateral upper extremity supported, Reliant on assistive device for balance Standing balance-Leahy Scale: Poor                              Cognition Arousal: Alert Behavior During Therapy: WFL for tasks assessed/performed Overall Cognitive Status: No family/caregiver present to determine baseline cognitive functioning  Exercises General Exercises - Lower Extremity Ankle Circles/Pumps: AROM, Both, 10 reps, Seated Quad Sets: Strengthening, Both, 10 reps, Seated Gluteal Sets: Strengthening, Both, 10 reps, Seated Long Arc Quad: Strengthening,  Both, 10 reps, Seated Hip Flexion/Marching: Strengthening, Both, 10 reps, Seated    General Comments        Pertinent Vitals/Pain Pain Assessment Pain Assessment: No/denies pain    Home Living                          Prior Function            PT Goals (current goals can now be found in the care plan section) Acute Rehab PT Goals Patient Stated Goal: go home PT Goal Formulation: With patient/family Time For Goal Achievement: 07/26/23 Potential to Achieve Goals: Good Progress towards PT goals: Progressing toward goals    Frequency    Min 1X/week      PT Plan      Co-evaluation              AM-PAC PT "6 Clicks" Mobility   Outcome Measure  Help needed turning from your back to your side while in a flat bed without using bedrails?: None Help needed moving from lying on your back to sitting on the side of a flat bed without using bedrails?: A Little Help needed moving to and from a bed to a chair (including a wheelchair)?: A Little Help needed standing up from a chair using your arms (e.g., wheelchair or bedside chair)?: A Little Help needed to walk in hospital room?: A Little Help needed climbing 3-5 steps with a railing? : A Lot 6 Click Score: 18    End of Session Equipment Utilized During Treatment: Gait belt Activity Tolerance: Patient tolerated treatment well Patient left: in chair;with call bell/phone within reach;with chair alarm set;with SCD's reapplied Nurse Communication: Mobility status (Removed straws and reachable food items; sign in room indicating full supervision no straws) PT Visit Diagnosis: Unsteadiness on feet (R26.81);Other abnormalities of gait and mobility (R26.89);Muscle weakness (generalized) (M62.81);History of falling (Z91.81);Difficulty in walking, not elsewhere classified (R26.2);Other symptoms and signs involving the nervous system (R29.898)     Time: 8295-6213 PT Time Calculation (min) (ACUTE ONLY): 16  min  Charges:    $Gait Training: 8-22 mins PT General Charges $$ ACUTE PT VISIT: 1 Visit                     Kathlyn Sacramento, PT, DPT [facility]  Rehabilitation Services Physical Therapist Office: [phone] Website: [email or web address]    Richard Carter 07/16/2023, 11:21 AM

## 2023-07-16 NOTE — Progress Notes (Signed)
Occupational Therapy Treatment Patient Details Name: Richard Carter MRN: [id number] DOB: 1941-05-07 Today's Date: 07/16/2023   History of present illness 82 y.o. male presents to the emergency department 8/16 for evaluation of code stroke.  Patient was reportedly found altered and down on his front porch lying in the sun. +ESBL coli 8/20. PMHx: remote alcohol abuse, dementia, GERD, hypertension, hyperlipidemia, seizures, ischemic CVA, atrial fibrillation on Eliquis.   OT comments  Pt progressing slowly toward established OT Goals. Focus session on strength, balance, return to ADL, and safety. Pt performing LB ADL at EOB with min A as well as transfers with cues for hand placement and light assist to rise. Pt lunch arriving during session. Pt needing mod cues for safety and sequencing to alternate small sips and bites per SLP recommendations. Additionally needing multimodal cues for upright posture sitting EOB. Pt without 24/7 support as previously reported, thus updated discharge recommendation. Patient will benefit from continued inpatient follow up therapy, <3 hours/day       If plan is discharge home, recommend the following:  A little help with walking and/or transfers;A lot of help with bathing/dressing/bathroom;Assistance with cooking/housework;Help with stairs or ramp for entrance;Assist for transportation;Direct supervision/assist for financial management;Assistance with feeding;Direct supervision/assist for medications management   Equipment Recommendations  None recommended by OT    Recommendations for Other Services      Precautions / Restrictions Precautions Precautions: Fall Restrictions Weight Bearing Restrictions: No       Mobility Bed Mobility Overal bed mobility: Needs Assistance Bed Mobility: Supine to Sit, Sit to Supine     Supine to sit: Min assist Sit to supine: Contact guard assist   General bed mobility comments: Min assist to rise to EOB, pulls through  therapist hand, Able to scoot hips to EOB today with VC only.    Transfers Overall transfer level: Needs assistance Equipment used: Rolling walker (2 wheels) Transfers: Sit to/from Stand Sit to Stand: Min assist           General transfer comment: Min assist for boost, cues for hand placement and technique. Improved control with descent     Balance Overall balance assessment: Needs assistance Sitting-balance support: No upper extremity supported, Feet supported Sitting balance-Leahy Scale: Fair     Standing balance support: Bilateral upper extremity supported, Reliant on assistive device for balance Standing balance-Leahy Scale: Poor                             ADL either performed or assessed with clinical judgement   ADL Overall ADL's : Needs assistance/impaired     Grooming: Set up;Sitting;Cueing for sequencing;Cueing for safety Grooming Details (indicate cue type and reason): lunch arriving after participation in LB ADL and pt eager to eat. Speech orders for full supervision. Pt cued for posture as tendency for neck forward flexion as well as cued to pace self, take small bites, and alternate sips and bites. Needing mod cues throughout             Lower Body Dressing: Minimal assistance;Sitting/lateral leans Lower Body Dressing Details (indicate cue type and reason): EOB. Min A to don [employer] with backward Environmental education officer: Minimal assistance;Rolling walker (2 wheels)           Functional mobility during ADLs: Minimal assistance;Rolling walker (2 wheels)      Extremity/Trunk Assessment Upper Extremity Assessment Upper Extremity Assessment: Generalized weakness   Lower Extremity Assessment Lower Extremity Assessment: Defer  to PT evaluation        Vision       Perception     Praxis      Cognition Arousal: Alert Behavior During Therapy: [facility] for tasks assessed/performed Overall Cognitive Status: No family/caregiver present to  determine baseline cognitive functioning                                 General Comments: Pt following basic commands. Pleasantly confused        Exercises      Shoulder Instructions       General Comments      Pertinent Vitals/ Pain       Pain Assessment Pain Assessment: No/denies pain  Home Living                                          Prior Functioning/Environment              Frequency  Min 2X/week        Progress Toward Goals  OT Goals(current goals can now be found in the care plan section)  Progress towards OT goals: Progressing toward goals  Acute Rehab OT Goals Patient Stated Goal: get home OT Goal Formulation: With patient Time For Goal Achievement: 07/26/23 Potential to Achieve Goals: Good ADL Goals Pt Will Perform Grooming: with contact guard assist;standing Pt Will Perform Upper Body Dressing: with set-up;sitting Pt Will Perform Lower Body Dressing: with contact guard assist;sit to/from stand Pt Will Transfer to Toilet: with contact guard assist;ambulating  Plan      Co-evaluation                 AM-PAC OT "6 Clicks" Daily Activity     Outcome Measure   Help from another person eating meals?: A Little Help from another person taking care of personal grooming?: A Little Help from another person toileting, which includes using toliet, bedpan, or urinal?: A Lot Help from another person bathing (including washing, rinsing, drying)?: A Lot Help from another person to put on and taking off regular upper body clothing?: A Lot Help from another person to put on and taking off regular lower body clothing?: A Little 6 Click Score: 15    End of Session Equipment Utilized During Treatment: Gait belt;Rolling walker (2 wheels)  OT Visit Diagnosis: Unsteadiness on feet (R26.81);Muscle weakness (generalized) (M62.81);History of falling (Z91.81);Other symptoms and signs involving cognitive function    Activity Tolerance Patient tolerated treatment well   Patient Left in bed;with call bell/phone within reach;with bed alarm set   Nurse Communication Mobility status        Time: 6962-9528 OT Time Calculation (min): 32 min  Charges: OT General Charges $OT Visit: 1 Visit OT Treatments $Self Care/Home Management : 23-37 mins  Tyler Deis, OTR/L [facility] Acute Rehabilitation Office: [phone]   Myrla Halsted 07/16/2023, 6:29 PM

## 2023-07-16 NOTE — Plan of Care (Signed)
  Problem: Education: Goal: Knowledge of General Education information will improve Description: Including pain rating scale, medication(s)/side effects and non-pharmacologic comfort measures Outcome: Progressing   Problem: Clinical Measurements: Goal: Respiratory complications will improve Outcome: Progressing   Problem: Activity: Goal: Risk for activity intolerance will decrease Outcome: Progressing   Problem: Nutrition: Goal: Adequate nutrition will be maintained Outcome: Progressing   Problem: Pain Managment: Goal: General experience of comfort will improve Outcome: Progressing   Problem: Safety: Goal: Ability to remain free from injury will improve Outcome: Progressing   Problem: Skin Integrity: Goal: Risk for impaired skin integrity will decrease Outcome: Progressing

## 2023-07-16 NOTE — Plan of Care (Signed)
  Problem: Fluid Volume: Goal: Hemodynamic stability will improve Outcome: Progressing   Problem: Clinical Measurements: Goal: Diagnostic test results will improve Outcome: Progressing Goal: Signs and symptoms of infection will decrease Outcome: Progressing   Problem: Respiratory: Goal: Ability to maintain adequate ventilation will improve Outcome: Progressing   Problem: Education: Goal: Knowledge of General Education information will improve Description: Including pain rating scale, medication(s)/side effects and non-pharmacologic comfort measures Outcome: Progressing   Problem: Health Behavior/Discharge Planning: Goal: Ability to manage health-related needs will improve Outcome: Progressing   Problem: Clinical Measurements: Goal: Ability to maintain clinical measurements within normal limits will improve Outcome: Progressing Goal: Will remain free from infection Outcome: Progressing Goal: Diagnostic test results will improve Outcome: Progressing Goal: Respiratory complications will improve Outcome: Progressing Goal: Cardiovascular complication will be avoided Outcome: Progressing   Problem: Activity: Goal: Risk for activity intolerance will decrease Outcome: Progressing   Problem: Nutrition: Goal: Adequate nutrition will be maintained Outcome: Progressing   Problem: Coping: Goal: Level of anxiety will decrease Outcome: Progressing   Problem: Elimination: Goal: Will not experience complications related to bowel motility Outcome: Progressing Goal: Will not experience complications related to urinary retention Outcome: Progressing   Problem: Pain Managment: Goal: General experience of comfort will improve Outcome: Progressing   Problem: Safety: Goal: Ability to remain free from injury will improve Outcome: Progressing   Problem: Skin Integrity: Goal: Risk for impaired skin integrity will decrease Outcome: Progressing   Problem: Education: Goal:  Knowledge of disease or condition will improve Outcome: Progressing Goal: Knowledge of secondary prevention will improve (MUST DOCUMENT ALL) Outcome: Progressing Goal: Knowledge of patient specific risk factors will improve Loraine Leriche N/A or DELETE if not current risk factor) Outcome: Progressing   Problem: Ischemic Stroke/TIA Tissue Perfusion: Goal: Complications of ischemic stroke/TIA will be minimized Outcome: Progressing   Problem: Coping: Goal: Will verbalize positive feelings about self Outcome: Progressing Goal: Will identify appropriate support needs Outcome: Progressing

## 2023-07-16 NOTE — Progress Notes (Addendum)
[name]:[id number] DOB: 21-Sep-1941 DOA: 07/11/2023 PCP: Marisue Ivan, MD    Brief Narrative:   Richard Carter is a 82 y.o. male with past medical history significant for CVA, seizure disorder, mixed Alzheimer's and vascular dementia, persistent atrial fibrillation on Eliquis, essential hypertension, hyperlipidemia, carotid artery stenosis s/p left CEA, remote EtOH abuse, CKD stage IIIa, COPD, tobacco use disorder, medical noncompliance presented to [facility] ED on 8/16 after being found down altered/unconscious.  EMS was called and patient was brought to the ED for further evaluation.  In the ED, patient was disoriented with garbled speech with some mild right-sided weakness.  He was initially put on 10 L of nonrebreather mask.  Code stroke was initiated.  He was febrile with a temperature of 102.1 F.  Initial labs showed hypokalemia with creatinine elevated at 2.1.  Alcohol level was less than 10.  Vitamin B12 of 671.  Chest x-ray with no acute cardiopulmonary disease findings.  COVID/flu/RSV PCR negative.  CT head without contrast with no acute intracranial abnormality.  CT angiogram head/neck with no emergent vascular finding, noted left carotid endarterectomy with widely patent internal carotid artery.  MRI brain without contrast with no acute reversible finding, prominent atrophy and chronic ischemic injury.  Neurology was consulted, patient was given loading dose of IV Keppra 2 g.  Patient was then admitted hospital for acute metabolic encephalopathy concerning for breakthrough seizure, urinary tract infection.   Assessment & Plan:   Acute metabolic encephalopathy: Resolved Hx seizure disorder with concern for breakthrough seizure  CT head, CT angiogram head/neck and MRI brain with no acute findings.   EEG with mild diffuse encephalopathy no seizures or epileptiform discharges noted.  Neurology was consulted initially and is on Keppra 500 mg p.o.  twice daily.  Neurology has signed off at this time.  Plan for skilled nursing facility at this time.   Hypokalemia: Resolved   Hypomagnesemia:  Magnesium 1.5 today.  Will replace with IV magnesium sulfate..  Add magnesium oxide as well.   ESBL E. coli urinary tract infection  Urine culture: + >40K ESBL coli.  Blood cultures negative.  Was initially on Rocephin has been changed to ertapenem.  Plan for 3-day course.  Start date 07/15/2023   Hyperthermia likely secondary to environmental exposure: resolved Temperature 102.1 F on ED arrival, now has normalized.  Hx CVA CT head, MRI brain negative for acute finding.  Continue Lipitor and Eliquis.  Mixed Alzheimer's and vascular dementia Delirium precautions.   Dysphagia Seen by speech therapy.  Recommend dysphagia 1, pured diet with nectar thick liquid   Persistent atrial fibrillation Essential hypertension Amlodipine currently on hold.  Continue Eliquis.   Hyperlipidemia Continue Lipitor   Hx carotid artery stenosis  s/p left CEA   CKD stage IIIa Creatinine 1.90>1.0, stable.  COPD Continue albuterol.  BPH: Continue tamsulosin 0.4 mg p.o. daily   Remote EtOH abuse Currently denies active use use.   Medical noncompliance Emphasized compliance.   Weakness/debility/deconditioning/gait disturbance: Seen by PT/OT, currently recommending skilled nursing facility.      DVT prophylaxis: SCDs Start: 07/12/23 0159 apixaban (ELIQUIS) tablet 5 mg   Code Status:     Code Status: DNR  Disposition: Skilled nursing facility, medically stable for disposition.  Status is: Inpatient  Remains inpatient appropriate because: IV antibiotic, awaiting for skilled nursing facility placement.   Family Communication: None at bedside.  Consultants:  Neurology  Procedures:  EEG  Antimicrobials:  Pincus Sanes IV  Anti-infectives (From admission, onward)    Start     Dose/Rate Route Frequency Ordered Stop   07/15/23 1045   ertapenem (INVANZ) 1 g in sodium chloride 0.9 % 100 mL IVPB        1 g 200 mL/hr over 30 Minutes Intravenous Every 24 hours 07/15/23 0949 07/18/23 1044   07/14/23 0200  vancomycin (VANCOREADY) IVPB 1250 mg/250 mL  Status:  Discontinued        1,250 mg 166.7 mL/hr over 90 Minutes Intravenous Every 48 hours 07/11/23 2311 07/12/23 1048   07/12/23 1145  cefTRIAXone (ROCEPHIN) 1 g in sodium chloride 0.9 % 100 mL IVPB  Status:  Discontinued        1 g 200 mL/hr over 30 Minutes Intravenous Every 24 hours 07/12/23 1048 07/15/23 0949   07/12/23 0245  ceFEPIme (MAXIPIME) 2 g in sodium chloride 0.9 % 100 mL IVPB  Status:  Discontinued        2 g 200 mL/hr over 30 Minutes Intravenous  Once 07/12/23 0159 07/12/23 0213   07/12/23 0245  vancomycin (VANCOCIN) IVPB 1000 mg/200 mL premix  Status:  Discontinued        1,000 mg 200 mL/hr over 60 Minutes Intravenous  Once 07/12/23 0159 07/12/23 0213   07/12/23 0159  metroNIDAZOLE (FLAGYL) IVPB 500 mg  Status:  Discontinued        500 mg 100 mL/hr over 60 Minutes Intravenous Every 12 hours 07/12/23 0159 07/12/23 1048   07/11/23 2315  vancomycin (VANCOREADY) IVPB 1500 mg/300 mL        1,500 mg 150 mL/hr over 120 Minutes Intravenous  Once 07/11/23 2311 07/12/23 0232   07/11/23 2315  ceFEPIme (MAXIPIME) 2 g in sodium chloride 0.9 % 100 mL IVPB  Status:  Discontinued        2 g 200 mL/hr over 30 Minutes Intravenous Every 24 hours 07/11/23 2311 07/12/23 1048       Subjective: Today, patient was seen and examined at bedside.  Difficulty with his speech but complains of mild pain on the forearm at the site of IV.  No other complaints.  Objective: Vitals:   07/15/23 2355 07/16/23 0318 07/16/23 0823 07/16/23 1125  BP: (!) 155/76 (!) 158/71 134/77 137/78  Pulse: (!) 51 (!) 50 (!) 56 63  Resp:   20 18  Temp: 97.7 F (36.5 C) (!) 97.5 F (36.4 C) 97.7 F (36.5 C) 97.6 F (36.4 C)  TempSrc: Oral Oral Oral Oral  SpO2: 100% 99% 100% 98%  Weight:      Height:         Intake/Output Summary (Last 24 hours) at 07/16/2023 1247 Last data filed at 07/16/2023 0900 Gross per 24 hour  Intake 460 ml  Output 1500 ml  Net -1040 ml   Filed Weights   07/11/23 1500 07/11/23 1555 07/12/23 0205  Weight: 65.2 kg 65.2 kg 65.5 kg    Physical Examination: Body mass index is 22.62 kg/m.  General:  Average built, not in obvious distress, appears chronically ill, HENT:   No scleral pallor or icterus noted. Oral mucosa is moist.  Chest:  Clear breath sounds. No crackles or wheezes.  CVS: S1 &S2 heard. No murmur.  Regular rate and rhythm. Abdomen: Soft, nontender, nondistended.  Bowel sounds are heard.   Extremities: No cyanosis, clubbing or edema.  Peripheral pulses are palpable. Psych: Alert, awake and oriented, flat affect, CNS: Dysarthric speech, slow speech.  Moves extremities. Skin: Warm and dry.  No rashes noted.  Data Reviewed:   CBC: Recent Labs  Lab 07/11/23 1540 07/11/23 1725 07/12/23 0246 07/13/23 0407  WBC  --  11.2* 10.9* 9.8  NEUTROABS  --  9.4* 8.4*  --   HGB 9.9* 9.8* 9.2* 8.8*  HCT 29.0* 29.3* 28.0* 27.0*  MCV  --  90.4 88.6 88.5  PLT  --  223 236 230    Basic Metabolic Panel: Recent Labs  Lab 07/12/23 0246 07/13/23 0407 07/14/23 0438 07/14/23 2357 07/16/23 0525  NA 136 131* 132* 134* 136  K 3.5 3.5 3.9 4.2 3.8  CL 110 106 107 105 107  CO2 17* 16* 17* 19* 21*  GLUCOSE 103* 91 98 102* 93  BUN 21 15 16 16 12   CREATININE 1.90* 1.30* 1.14 1.09 1.20  CALCIUM 8.5* 8.6* 9.0 9.2 9.2  MG 1.3* 1.1* 2.0 1.6* 1.5*  PHOS 2.9  --   --   --  3.0    Liver Function Tests: Recent Labs  Lab 07/11/23 1725 07/12/23 0246  AST 16 73*  ALT 13 21  ALKPHOS 71 68  BILITOT 0.6 0.3  PROT 6.3* 6.3*  ALBUMIN 2.8* 2.9*     Radiology Studies: No results found.    LOS: 5 days    Joycelyn Das, MD [facility] Available via Epic secure chat 7am-7pm After these hours, please refer to coverage provider listed on  [email or web address] 07/16/2023, 12:47 PM

## 2023-07-16 NOTE — Consult Note (Addendum)
Value [facility]) Accountable Care Organization [facility]) [facility] Liaison Note  07/16/2023  Richard Carter [birth year] [id number]  Location: [facility] Liaison met patient at bedside at [facility].  Insurance: [facility] HMO   Richard Carter is a 82 y.o. male who is a Primary Care Patient of Marisue Ivan, MD [facility] The patient was screened for 30 day readmission hospitalization with noted high risk score for unplanned readmission risk with 2 IP/1 ED in 6 months.  The patient was assessed for potential Value [facility]) Care Management service needs for post hospital transition for care coordination. Review of patient's electronic medical record reveals patient was admitted with Encephalopathy. Pt on isolation as liaison will preserve PPE for immediate staff personnel. TOC indicates pt pending SNF level of care for ongoing STR. Facility will manage pt's ongoing needs post discharge to the facility.  Plan: [facility] Liaison will continue to follow progress and disposition to asess for post hospital community care coordination/management needs.  Referral request for community care coordination: pending disposition.   [facility] Care Management/Population Health does not replace or interfere with any arrangements made by the Inpatient Transition of Care team.   For questions contact:   Richard Cousin, RN, BSN [facility] Liaison [place]   Value [facility]), Population Health [phone] mobile [email or web address]

## 2023-07-16 NOTE — Hospital Course (Signed)
Richard Carter is a 82 y.o. male with past medical history significant for CVA, seizure disorder, mixed Alzheimer's and vascular dementia, persistent atrial fibrillation on Eliquis, essential hypertension, hyperlipidemia, carotid artery stenosis s/p left CEA, remote EtOH abuse, CKD stage IIIa, COPD, tobacco use disorder, medical noncompliance presented to [facility] ED on 8/16 after being found down altered/unconscious.  EMS was called and patient was brought to the ED for further evaluation.  In the ED patient was disoriented with garbled speech with some mild right-sided weakness.  He was initially put on 10 L of nonrebreather mask.  Code stroke was initiated.  He was febrile with a temperature of 102.1 F.  Initial labs showed hypokalemia with creatinine elevated at 2.1.  Alcohol level was less than 10.  Vitamin B12 of 671.  Chest x-ray with no acute cardiopulmonary disease findings.  COVID/flu/RSV PCR negative.  CT head without contrast with no acute intracranial abnormality.  CT angiogram head/neck with no emergent vascular finding, noted left carotid endarterectomy with widely patent internal carotid artery.  MRI brain without contrast with no acute reversible finding, prominent atrophy and chronic ischemic injury.  Neurology was consulted, patient was given loading dose of IV Keppra 2 g.  Patient was then admitted hospital for acute metabolic encephalopathy concerning for breakthrough seizure, urinary tract infection.   Assessment & Plan:   Acute metabolic encephalopathy: Resolved Hx seizure disorder with concern for breakthrough seizure  CT head, CT angiogram head/neck and MRI brain with no acute findings.   EEG with mild diffuse encephalopathy no seizures or epileptiform discharges noted.  Neurology was consulted initially and is on Keppra 500 mg p.o. twice daily.  Neurology has signed off at this time.  Plan for skilled nursing facility at this time.   Hypokalemia: Resolved    Hypomagnesemia:  Magnesium 1.5 today.  Will replace with IV magnesium sulfate..   ESBL E. coli urinary tract infection  Urine culture: + >40K ESBL coli.  Blood cultures negative.  Was initially on Rocephin has been changed to ertapenem.  Plan for 3-day course.   Hyperthermia likely secondary to environmental exposure: resolved Temperature 102.1 F on ED arrival, now has normalized.  Hx CVA CT head, MRI brain negative for acute finding.  Continue Lipitor and Eliquis. -- Atorvastatin 20 mg p.o. daily -- Eliquis 5 mg p.o. twice daily   Mixed Alzheimer's and vascular dementia Delirium precautions.   Dysphagia Seen by speech therapy.  Recommend dysphagia 1, pured diet with nectar thick liquid   Persistent atrial fibrillation Essential hypertension Amlodipine currently on hold.  Continue Eliquis.   Hyperlipidemia Continue Lipitor   Hx carotid artery stenosis  s/p left CEA   CKD stage IIIa Creatinine 1.90>1.0, stable.  COPD Continue albuterol.  BPH: Continue tamsulosin 0.4 mg p.o. daily   Remote EtOH abuse Currently denies active use use.   Medical noncompliance Emphasized compliance.   Weakness/debility/deconditioning/gait disturbance: Seen by PT/OT, currently recommending skilled nursing facility.

## 2023-07-17 ENCOUNTER — Inpatient Hospital Stay (HOSPITAL_COMMUNITY): Payer: Medicare HMO

## 2023-07-17 DIAGNOSIS — F1010 Alcohol abuse, uncomplicated: Secondary | ICD-10-CM | POA: Diagnosis not present

## 2023-07-17 DIAGNOSIS — R569 Unspecified convulsions: Secondary | ICD-10-CM | POA: Diagnosis not present

## 2023-07-17 DIAGNOSIS — G9340 Encephalopathy, unspecified: Secondary | ICD-10-CM | POA: Diagnosis not present

## 2023-07-17 DIAGNOSIS — I10 Essential (primary) hypertension: Secondary | ICD-10-CM | POA: Diagnosis not present

## 2023-07-17 LAB — CULTURE, BLOOD (ROUTINE X 2)
Culture: NO GROWTH
Culture: NO GROWTH
Special Requests: ADEQUATE

## 2023-07-17 LAB — BASIC METABOLIC PANEL
Anion gap: 6 (ref 5–15)
BUN: 12 mg/dL (ref 8–23)
CO2: 24 mmol/L (ref 22–32)
Calcium: 9.4 mg/dL (ref 8.9–10.3)
Chloride: 105 mmol/L (ref 98–111)
Creatinine, Ser: 0.92 mg/dL (ref 0.61–1.24)
GFR, Estimated: >60 mL/min (ref >60)
Glucose, Bld: 86 mg/dL (ref 70–99)
Potassium: 4.2 mmol/L (ref 3.5–5.1)
Sodium: 135 mmol/L (ref 135–145)

## 2023-07-17 LAB — MAGNESIUM: Magnesium: 2 mg/dL (ref 1.7–2.4)

## 2023-07-17 NOTE — Evaluation (Addendum)
Modified Barium Swallow Study  Patient Details  Name: DIMARCO HOLSTAD MRN: [id number] Date of Birth: 04/18/1941  Today's Date: 07/17/2023  Modified Barium Swallow completed.  Full report located under Chart Review in the Imaging Section.  History of Present Illness 82 y.o. male presents to the emergency department 8/16 for evaluation of code stroke.  Patient was reportedly found altered and down on his front porch lying in the sun. MRI negative for acute stroke.  PMHx: remote alcohol abuse, dementia, GERD, hypertension, hyperlipidemia, seizures, ischemic CVA, atrial fibrillation on Eliquis.   Clinical Impression Pateint presents with a moderate oropharyngeal dysphagia. Orally, patient with lingual pumping across consistencies and mastication of solid boluses beyond puree is incomplete resulting in larger peices of boluses falling over the base of the tongue and into the airway prior to and during the swallow. Additionally, combination of delay in swallow initiation and decreased laryngeal closure results in relatively consistent aspiration of liquids thinner than honey thick. Unfortunately use of tsp for liquids unsuccessful to consistently protect the airway. Use of chin tuck only consistent with nectar thick liquids although note that patient does not always use consistently and will requirei max assistance for this during po intake. Airway was fully protected with both pureed solids and honey thick liquids during today's study. At this time, will downgrade diet accordingly. Concern that use of honey thick liquids long term could contribute to dehydration.  Hopeful that this is an exacerbation of baseline dysphagia that may improve with improvement in acute condition.   Factors that may increase risk of adverse event in presence of aspiration Rubye Oaks [employer] Clearance Coots 2021):    Swallow Evaluation Recommendations Recommendations: PO diet PO Diet Recommendation: Dysphagia 1 (Pureed);Moderately thick  liquids (Level 3, honey thick) Liquid Administration via: Cup;Spoon Medication Administration: Crushed with puree Supervision: Patient able to self-feed;Full supervision/cueing for swallowing strategies Swallowing strategies  : Slow rate;Small bites/sips Postural changes: Position pt fully upright for meals Oral care recommendations: Oral care BID (2x/day) Caregiver Recommendations: Avoid jello, ice cream, thin soups, popsicles;Remove water pitcher     Jahnae Mcadoo MA, CCC-SLP  Jusiah Aguayo Meryl 07/17/2023,1:23 PM

## 2023-07-17 NOTE — Progress Notes (Addendum)
PROGRESS NOTE    Richard Carter  NFA:[id number] DOB: [birth year] DOA: 07/11/2023 PCP: Marisue Ivan, MD    Brief Narrative:   Richard Carter is a 82 y.o. male with past medical history significant for CVA, seizure disorder, mixed Alzheimer's and vascular dementia, persistent atrial fibrillation on Eliquis, essential hypertension, hyperlipidemia, carotid artery stenosis s/p left CEA, remote EtOH abuse, CKD stage IIIa, COPD, tobacco use disorder, medical noncompliance presented to [facility] ED on 8/16 after being found down altered/unconscious.  EMS was called and patient was brought to the ED for further evaluation.  In the ED, patient was disoriented with garbled speech with some mild right-sided weakness.  He was initially put on 10 L of nonrebreather mask.  Code stroke was initiated.  He was febrile with a temperature of 102.1 F.  Initial labs showed hypokalemia with creatinine elevated at 2.1.  Alcohol level was less than 10.  Vitamin B12 of 671.  Chest x-ray with no acute cardiopulmonary disease findings.  COVID/flu/RSV PCR negative.  CT head without contrast with no acute intracranial abnormality.  CT angiogram head/neck with no emergent vascular finding, noted left carotid endarterectomy with widely patent internal carotid artery.  MRI brain without contrast with no acute reversible finding, prominent atrophy and chronic ischemic injury.  Neurology was consulted, patient was given loading dose of IV Keppra 2 g.  Patient was then admitted to the hospital for acute metabolic encephalopathy concerning for breakthrough seizure, urinary tract infection.   Assessment & Plan:   Acute metabolic encephalopathy:  Improved at this time.  Likely multifactorial from seizures, UTI, hyperthermia on the background of dementia.  Hx seizure disorder with concern for breakthrough seizure  CT head, CT angiogram head/neck and MRI brain with no acute findings.   EEG with mild diffuse  encephalopathy no seizures or epileptiform discharges noted.  Neurology was consulted initially and is on Keppra 500 mg p.o. twice daily.  Neurology has signed off at this time.  Plan for skilled nursing facility at this time.   Hypokalemia: Resolved, latest potassium of 4.2   Hypomagnesemia:    Was given magnesium sulfate IV yesterday, continue oral magnesium oxide.  Magnesium today improved at 2.0    ESBL E. coli urinary tract infection  Urine culture: + >40K ESBL coli.  Blood cultures negative.  Was initially on Rocephin has been changed to ertapenem.  Completed 3-day course of ertapenem.  Hyperthermia likely secondary to environmental exposure: resolved Temperature 102.1 F on ED arrival, now has normalized.  Temperature max of 98.3 F  Hx CVA CT head, MRI brain negative for acute finding.  Continue Lipitor and Eliquis.  Mixed Alzheimer's and vascular dementia Delirium precautions.   Dysphagia Seen by speech therapy.  Recommend dysphagia 1, pured diet with nectar thick liquid   Persistent atrial fibrillation Essential hypertension Amlodipine currently on hold.  Continue Eliquis.   Hyperlipidemia Continue Lipitor   Hx carotid artery stenosis  s/p left CEA   CKD stage IIIa Creatinine 1.90>1.0, stable.  COPD Continue albuterol.  BPH: Continue tamsulosin 0.4 mg p.o. daily   Remote EtOH abuse Currently denies active use use.   Medical noncompliance Emphasized compliance.   Weakness/debility/deconditioning/gait disturbance: Seen by PT/OT, currently recommending skilled nursing facility.      DVT prophylaxis: SCDs Start: 07/12/23 0159 apixaban (ELIQUIS) tablet 5 mg   Code Status:     Code Status: DNR  Disposition:  Skilled nursing facility, medically stable for disposition.  Status is: Inpatient  Remains inpatient appropriate  because:  awaiting for skilled nursing facility placement.   Family Communication: None at bedside.  Spoke with the patient's  health power of attorney [name] on the phone and updated him about the clinical condition of the patient.  Consultants:  Neurology  Procedures:  EEG  Antimicrobials:  Invanz IV  Anti-infectives (From admission, onward)    Start     Dose/Rate Route Frequency Ordered Stop   07/15/23 1045  ertapenem (INVANZ) 1 g in sodium chloride 0.9 % 100 mL IVPB        1 g 200 mL/hr over 30 Minutes Intravenous Every 24 hours 07/15/23 0949 07/17/23 1021   07/14/23 0200  vancomycin (VANCOREADY) IVPB 1250 mg/250 mL  Status:  Discontinued        1,250 mg 166.7 mL/hr over 90 Minutes Intravenous Every 48 hours 07/11/23 2311 07/12/23 1048   07/12/23 1145  cefTRIAXone (ROCEPHIN) 1 g in sodium chloride 0.9 % 100 mL IVPB  Status:  Discontinued        1 g 200 mL/hr over 30 Minutes Intravenous Every 24 hours 07/12/23 1048 07/15/23 0949   07/12/23 0245  ceFEPIme (MAXIPIME) 2 g in sodium chloride 0.9 % 100 mL IVPB  Status:  Discontinued        2 g 200 mL/hr over 30 Minutes Intravenous  Once 07/12/23 0159 07/12/23 0213   07/12/23 0245  vancomycin (VANCOCIN) IVPB 1000 mg/200 mL premix  Status:  Discontinued        1,000 mg 200 mL/hr over 60 Minutes Intravenous  Once 07/12/23 0159 07/12/23 0213   07/12/23 0159  metroNIDAZOLE (FLAGYL) IVPB 500 mg  Status:  Discontinued        500 mg 100 mL/hr over 60 Minutes Intravenous Every 12 hours 07/12/23 0159 07/12/23 1048   07/11/23 2315  vancomycin (VANCOREADY) IVPB 1500 mg/300 mL        1,500 mg 150 mL/hr over 120 Minutes Intravenous  Once 07/11/23 2311 07/12/23 0232   07/11/23 2315  ceFEPIme (MAXIPIME) 2 g in sodium chloride 0.9 % 100 mL IVPB  Status:  Discontinued        2 g 200 mL/hr over 30 Minutes Intravenous Every 24 hours 07/11/23 2311 07/12/23 1048      Subjective: Today, patient was seen and examined at bedside.  Still has some speech difficulty but denies any nausea, vomiting fever chills or rigor.    Objective: Vitals:   07/16/23 2325  07/17/23 0343 07/17/23 0758 07/17/23 1102  BP: (!) 151/75 (!) 144/84 (!) 160/80 113/69  Pulse: 62 (!) 54 (!) 54 65  Resp: 18 18 18 18   Temp: 98.1 F (36.7 C) 98.1 F (36.7 C) 98 F (36.7 C) 98.3 F (36.8 C)  TempSrc: Axillary Axillary Oral Oral  SpO2: 100% 97% 97% 96%  Weight:      Height:        Intake/Output Summary (Last 24 hours) at 07/17/2023 1110 Last data filed at 07/17/2023 0345 Gross per 24 hour  Intake 240 ml  Output 1300 ml  Net -1060 ml   Filed Weights   07/11/23 1500 07/11/23 1555 07/12/23 0205  Weight: 65.2 kg 65.2 kg 65.5 kg    Physical Examination: Body mass index is 22.62 kg/m.   General:  Average built, not in obvious distress, appears chronically ill, HENT:   No scleral pallor or icterus noted. Oral mucosa is moist.  Chest:  Clear breath sounds. No crackles or wheezes.  CVS: S1 &S2 heard. No murmur.  Regular  rate and rhythm. Abdomen: Soft, nontender, nondistended.  Bowel sounds are heard.   Extremities: No cyanosis, clubbing or edema.  Peripheral pulses are palpable. Psych: Alert, awake and oriented, flat affect, CNS: Dysarthric speech, slow speech.  Moves extremities. Skin: Warm and dry.  No rashes noted.  Data Reviewed:   CBC: Recent Labs  Lab 07/11/23 1540 07/11/23 1725 07/12/23 0246 07/13/23 0407  WBC  --  11.2* 10.9* 9.8  NEUTROABS  --  9.4* 8.4*  --   HGB 9.9* 9.8* 9.2* 8.8*  HCT 29.0* 29.3* 28.0* 27.0*  MCV  --  90.4 88.6 88.5  PLT  --  223 236 230    Basic Metabolic Panel: Recent Labs  Lab 07/12/23 0246 07/13/23 0407 07/14/23 0438 07/14/23 2357 07/16/23 0525 07/17/23 0136  NA 136 131* 132* 134* 136 135  K 3.5 3.5 3.9 4.2 3.8 4.2  CL 110 106 107 105 107 105  CO2 17* 16* 17* 19* 21* 24  GLUCOSE 103* 91 98 102* 93 86  BUN 21 15 16 16 12 12   CREATININE 1.90* 1.30* 1.14 1.09 1.20 0.92  CALCIUM 8.5* 8.6* 9.0 9.2 9.2 9.4  MG 1.3* 1.1* 2.0 1.6* 1.5* 2.0  PHOS 2.9  --   --   --  3.0  --     Liver Function Tests: Recent  Labs  Lab 07/11/23 1725 07/12/23 0246  AST 16 73*  ALT 13 21  ALKPHOS 71 68  BILITOT 0.6 0.3  PROT 6.3* 6.3*  ALBUMIN 2.8* 2.9*     Radiology Studies: No results found.    LOS: 6 days    Joycelyn Das, MD [facility] Available via Epic secure chat 7am-7pm After these hours, please refer to coverage provider listed on [email or web address] 07/17/2023, 11:10 AM

## 2023-07-17 NOTE — Plan of Care (Signed)
  Problem: Fluid Volume: Goal: Hemodynamic stability will improve 07/17/2023 0127 by Nicole Cella, RN Outcome: Progressing 07/17/2023 0125 by Nicole Cella, RN Outcome: Progressing   Problem: Clinical Measurements: Goal: Diagnostic test results will improve 07/17/2023 0127 by Nicole Cella, RN Outcome: Progressing 07/17/2023 0125 by Nicole Cella, RN Outcome: Progressing Goal: Signs and symptoms of infection will decrease 07/17/2023 0127 by Nicole Cella, RN Outcome: Progressing 07/17/2023 0125 by Nicole Cella, RN Outcome: Progressing   Problem: Respiratory: Goal: Ability to maintain adequate ventilation will improve 07/17/2023 0127 by Nicole Cella, RN Outcome: Progressing 07/17/2023 0125 by Nicole Cella, RN Outcome: Progressing   Problem: Education: Goal: Knowledge of General Education information will improve Description: Including pain rating scale, medication(s)/side effects and non-pharmacologic comfort measures 07/17/2023 0127 by Nicole Cella, RN Outcome: Progressing 07/17/2023 0125 by Nicole Cella, RN Outcome: Progressing   Problem: Health Behavior/Discharge Planning: Goal: Ability to manage health-related needs will improve Outcome: Progressing   Problem: Clinical Measurements: Goal: Ability to maintain clinical measurements within normal limits will improve Outcome: Progressing Goal: Will remain free from infection Outcome: Progressing

## 2023-07-17 NOTE — Plan of Care (Signed)

## 2023-07-17 NOTE — Plan of Care (Signed)
  Problem: Fluid Volume: Goal: Hemodynamic stability will improve Outcome: Progressing   Problem: Clinical Measurements: Goal: Diagnostic test results will improve Outcome: Progressing Goal: Signs and symptoms of infection will decrease Outcome: Progressing   Problem: Respiratory: Goal: Ability to maintain adequate ventilation will improve Outcome: Progressing   Problem: Education: Goal: Knowledge of General Education information will improve Description: Including pain rating scale, medication(s)/side effects and non-pharmacologic comfort measures Outcome: Progressing   Problem: Health Behavior/Discharge Planning: Goal: Ability to manage health-related needs will improve Outcome: Progressing   Problem: Clinical Measurements: Goal: Ability to maintain clinical measurements within normal limits will improve Outcome: Progressing Goal: Will remain free from infection Outcome: Progressing Goal: Diagnostic test results will improve Outcome: Progressing Goal: Respiratory complications will improve Outcome: Progressing Goal: Cardiovascular complication will be avoided Outcome: Progressing   Problem: Activity: Goal: Risk for activity intolerance will decrease Outcome: Progressing   Problem: Nutrition: Goal: Adequate nutrition will be maintained Outcome: Progressing   Problem: Coping: Goal: Level of anxiety will decrease Outcome: Progressing   Problem: Elimination: Goal: Will not experience complications related to bowel motility Outcome: Progressing Goal: Will not experience complications related to urinary retention Outcome: Progressing   Problem: Pain Managment: Goal: General experience of comfort will improve Outcome: Progressing   Problem: Safety: Goal: Ability to remain free from injury will improve Outcome: Progressing   Problem: Skin Integrity: Goal: Risk for impaired skin integrity will decrease Outcome: Progressing   Problem: Education: Goal:  Knowledge of disease or condition will improve Outcome: Progressing Goal: Knowledge of secondary prevention will improve (MUST DOCUMENT ALL) Outcome: Progressing Goal: Knowledge of patient specific risk factors will improve Loraine Leriche N/A or DELETE if not current risk factor) Outcome: Progressing   Problem: Ischemic Stroke/TIA Tissue Perfusion: Goal: Complications of ischemic stroke/TIA will be minimized Outcome: Progressing   Problem: Coping: Goal: Will verbalize positive feelings about self Outcome: Progressing Goal: Will identify appropriate support needs Outcome: Progressing

## 2023-07-17 NOTE — Progress Notes (Signed)
Speech Language Pathology Treatment: Dysphagia  Patient Details Name: Richard Carter MRN: [id number] DOB: 1941-09-08 Today's Date: 07/17/2023 Time: 0920-0940 SLP Time Calculation (min) (ACUTE ONLY): 20 min  Assessment / Plan / Recommendation Clinical Impression  Patient seen for diet tolerance assessment. Patient alert and cooperative. He was able to consume pureed solids without overt indication of aspiration however with nectar thick liquids, patient with cough post swallow suggestive of decreased airway protection, in 75% of boluses, despite SLP providing moderate verbal cueing for small sips. At this time, given h/o dysphagia and ongoing inconsistent s/s of aspiration at bedside, recommend repeat MBS to evaluate current swallowing function. This will assist in determining least restrictive diet prior to d/c. Plan for MBS today at 1230.    HPI HPI: 82 y.o. male presents to the emergency department 8/16 for evaluation of code stroke.  Patient was reportedly found altered and down on his front porch lying in the sun. MRI negative for acute stroke.  PMHx: remote alcohol abuse, dementia, GERD, hypertension, hyperlipidemia, seizures, ischemic CVA, atrial fibrillation on Eliquis.      SLP Plan  MBS      Recommendations for follow up therapy are one component of a multi-disciplinary discharge planning process, led by the attending physician.  Recommendations may be updated based on patient status, additional functional criteria and insurance authorization.    Recommendations  Diet recommendations: Dysphagia 1 (puree);Nectar-thick liquid Liquids provided via: No straw;Cup Medication Administration: Crushed with puree Supervision: Patient able to self feed;Full supervision/cueing for compensatory strategies Compensations: Small sips/bites;Slow rate Postural Changes and/or Swallow Maneuvers: Seated upright 90 degrees                  Oral care BID     Dysphagia, oropharyngeal phase  (R13.12)     MBS    Jonhatan Hearty MA, CCC-SLP  Kehinde Bowdish Meryl  07/17/2023, 9:54 AM

## 2023-07-18 DIAGNOSIS — I480 Paroxysmal atrial fibrillation: Secondary | ICD-10-CM | POA: Diagnosis not present

## 2023-07-18 DIAGNOSIS — N400 Enlarged prostate without lower urinary tract symptoms: Secondary | ICD-10-CM | POA: Diagnosis not present

## 2023-07-18 DIAGNOSIS — I639 Cerebral infarction, unspecified: Secondary | ICD-10-CM | POA: Diagnosis not present

## 2023-07-18 DIAGNOSIS — I679 Cerebrovascular disease, unspecified: Secondary | ICD-10-CM | POA: Diagnosis not present

## 2023-07-18 DIAGNOSIS — F1010 Alcohol abuse, uncomplicated: Secondary | ICD-10-CM | POA: Diagnosis not present

## 2023-07-18 DIAGNOSIS — E876 Hypokalemia: Secondary | ICD-10-CM | POA: Diagnosis not present

## 2023-07-18 DIAGNOSIS — Z743 Need for continuous supervision: Secondary | ICD-10-CM | POA: Diagnosis not present

## 2023-07-18 DIAGNOSIS — J449 Chronic obstructive pulmonary disease, unspecified: Secondary | ICD-10-CM | POA: Diagnosis not present

## 2023-07-18 DIAGNOSIS — I69354 Hemiplegia and hemiparesis following cerebral infarction affecting left non-dominant side: Secondary | ICD-10-CM | POA: Diagnosis not present

## 2023-07-18 DIAGNOSIS — D649 Anemia, unspecified: Secondary | ICD-10-CM | POA: Diagnosis not present

## 2023-07-18 DIAGNOSIS — G9340 Encephalopathy, unspecified: Secondary | ICD-10-CM | POA: Diagnosis not present

## 2023-07-18 DIAGNOSIS — R531 Weakness: Secondary | ICD-10-CM | POA: Diagnosis not present

## 2023-07-18 DIAGNOSIS — E785 Hyperlipidemia, unspecified: Secondary | ICD-10-CM | POA: Diagnosis not present

## 2023-07-18 DIAGNOSIS — R1310 Dysphagia, unspecified: Secondary | ICD-10-CM | POA: Diagnosis not present

## 2023-07-18 DIAGNOSIS — R569 Unspecified convulsions: Secondary | ICD-10-CM | POA: Diagnosis not present

## 2023-07-18 DIAGNOSIS — K922 Gastrointestinal hemorrhage, unspecified: Secondary | ICD-10-CM | POA: Diagnosis not present

## 2023-07-18 DIAGNOSIS — I10 Essential (primary) hypertension: Secondary | ICD-10-CM | POA: Diagnosis not present

## 2023-07-18 DIAGNOSIS — N390 Urinary tract infection, site not specified: Secondary | ICD-10-CM | POA: Diagnosis not present

## 2023-07-18 DIAGNOSIS — G9341 Metabolic encephalopathy: Secondary | ICD-10-CM | POA: Diagnosis not present

## 2023-07-18 DIAGNOSIS — F0390 Unspecified dementia without behavioral disturbance: Secondary | ICD-10-CM | POA: Diagnosis not present

## 2023-07-18 DIAGNOSIS — G40909 Epilepsy, unspecified, not intractable, without status epilepticus: Secondary | ICD-10-CM | POA: Diagnosis not present

## 2023-07-18 DIAGNOSIS — Z8673 Personal history of transient ischemic attack (TIA), and cerebral infarction without residual deficits: Secondary | ICD-10-CM | POA: Diagnosis not present

## 2023-07-18 DIAGNOSIS — R52 Pain, unspecified: Secondary | ICD-10-CM | POA: Diagnosis not present

## 2023-07-18 DIAGNOSIS — K219 Gastro-esophageal reflux disease without esophagitis: Secondary | ICD-10-CM | POA: Diagnosis not present

## 2023-07-18 DIAGNOSIS — G4700 Insomnia, unspecified: Secondary | ICD-10-CM | POA: Diagnosis not present

## 2023-07-18 LAB — CREATININE, SERUM
Creatinine, Ser: 1 mg/dL (ref 0.61–1.24)
GFR, Estimated: >60 mL/min (ref >60)

## 2023-07-18 MED ORDER — BISACODYL 5 MG PO TBEC: 5.0000 mg | DELAYED_RELEASE_TABLET | Freq: Every day | ORAL | Status: DC | PRN

## 2023-07-18 MED ORDER — MAGNESIUM OXIDE -MG SUPPLEMENT 400 (240 MG) MG PO TABS: 400.0000 mg | ORAL_TABLET | Freq: Every day | ORAL | Status: AC

## 2023-07-18 MED ORDER — ORAL CARE MOUTH RINSE: 15.0000 mL | OROMUCOSAL | Status: DC | PRN

## 2023-07-18 MED ORDER — SENNOSIDES-DOCUSATE SODIUM 8.6-50 MG PO TABS: 1.0000 | ORAL_TABLET | Freq: Every day | ORAL | Status: DC

## 2023-07-18 MED ORDER — ALBUTEROL SULFATE (2.5 MG/3ML) 0.083% IN NEBU: 2.5000 mg | INHALATION_SOLUTION | Freq: Four times a day (QID) | RESPIRATORY_TRACT | 12 refills | Status: DC | PRN

## 2023-07-18 MED ORDER — LEVETIRACETAM 500 MG PO TABS: 500.0000 mg | ORAL_TABLET | Freq: Two times a day (BID) | ORAL | Status: DC

## 2023-07-18 NOTE — TOC Transition Note (Signed)
Transition of Care [facility]) - CM/SW Discharge Note   Patient Details  Name: Richard Carter MRN: [id number] Date of Birth: [birth year]  Transition of Care [facility]) CM/SW Contact:  Baldemar Lenis, LCSW Phone Number: 07/18/2023, 11:15 AM   Clinical Narrative:   Patient received insurance approval to discharge to [facility]. CSW updated MD, sent discharge information to [facility] and confirmed bed is available. CSW updated POA [name], he is in agreement. Transport arranged with [facility] for next available.  Nurse to call report to [phone] room 111.    Final next level of care: Skilled Nursing Facility Barriers to Discharge: Barriers Resolved   Patient Goals and CMS Choice CMS Medicare.gov Compare Post Acute Care list provided to:: Patient Represenative (must comment) Choice offered to / list presented to : [facility] POA / Guardian  Discharge Placement                Patient chooses bed at: [facility] Patient to be transferred to facility by: [facility] Name of family member notified: [name] Patient and family notified of of transfer: 07/18/23  Discharge Plan and Services Additional resources added to the After Visit Summary for       Post Acute Care Choice: Skilled Nursing Facility                               Social Determinants of Health (SDOH) Interventions SDOH Screenings   Food Insecurity: No Food Insecurity (07/12/2023)  Housing: Patient Declined (07/12/2023)  Transportation Needs: No Transportation Needs (07/12/2023)  Utilities: Not At Risk (07/12/2023)  Financial Resource Strain: Low Risk  (07/08/2023)   Received from [facility]  Tobacco Use: Medium Risk (07/11/2023)     Readmission Risk Interventions    04/21/2022    1:55 PM  Readmission Risk Prevention Plan  Post Dischage Appt Complete  Medication Screening Complete  Transportation Screening Complete

## 2023-07-18 NOTE — Plan of Care (Signed)

## 2023-07-18 NOTE — TOC Transition Note (Addendum)
Transition of Care [facility]) - CM/SW Progression Note   Patient Details  Name: Richard Carter MRN: [id number] Date of Birth: 29-Nov-1940  [facility]) CM/SW Contact:  Baldemar Lenis, LCSW Phone Number: 07/18/2023, 11:14 AM   Clinical Narrative:   CSW checked on patient's insurance authorization. Peer to peer has been offered on patient. CSW sent peer to peer information to MD to update. CSW to follow.    Final next level of care: Skilled Nursing Facility Barriers to Discharge: Barriers Resolved   Patient Goals and CMS Choice CMS Medicare.gov Compare Post Acute Care list provided to:: Patient Represenative (must comment) Choice offered to / list presented to : [facility] POA / Guardian  Discharge Placement                Patient chooses bed at: [facility] Patient to be transferred to facility by: [facility] Name of family member notified: [name] Patient and family notified of of transfer: 07/18/23  Discharge Plan and Services Additional resources added to the After Visit Summary for       Post Acute Care Choice: Skilled Nursing Facility                               Social Determinants of Health (SDOH) Interventions SDOH Screenings   Food Insecurity: No Food Insecurity (07/12/2023)  Housing: Patient Declined (07/12/2023)  Transportation Needs: No Transportation Needs (07/12/2023)  Utilities: Not At Risk (07/12/2023)  Financial Resource Strain: Low Risk  (07/08/2023)   Received from [facility]  Tobacco Use: Medium Risk (07/11/2023)     Readmission Risk Interventions    04/21/2022    1:55 PM  Readmission Risk Prevention Plan  Post Dischage Appt Complete  Medication Screening Complete  Transportation Screening Complete

## 2023-07-18 NOTE — Discharge Summary (Signed)
Physician Discharge Summary  Richard Carter MWN:[id number] DOB: 04/20/1941 DOA: 07/11/2023  PCP: Marisue Ivan, MD  Admit date: 07/11/2023 Discharge date: 07/18/2023  Admitted From: Home  Discharge disposition: Skilled nursing facility   Recommendations for Outpatient Follow-Up:   Follow up with your primary care provider in one week.  Check CBC, BMP, magnesium in the next visit  Discharge Diagnosis:   Principal Problem:   Encephalopathy acute Active Problems:   HTN (hypertension)   Seizures (HCC)   Alcohol abuse   Dementia (HCC)   History of stroke   Hypokalemia   Discharge Condition: Improved.  Diet recommendation: Pured diet.  Nectar thick liquids.  Advance as tolerated  Wound care: None.  Code status: DNR   History of Present Illness:   Richard Carter is a 82 y.o. male with past medical history significant for CVA, seizure disorder, mixed Alzheimer's and vascular dementia, persistent atrial fibrillation on Eliquis, essential hypertension, hyperlipidemia, carotid artery stenosis s/p left CEA, remote EtOH abuse, CKD stage IIIa, COPD, tobacco use disorder, medical noncompliance presented to [facility] ED on 8/16 after being found down altered/unconscious.  EMS was called and patient was brought to the ED for further evaluation.  In the ED, patient was disoriented with garbled speech with some mild right-sided weakness.  He was initially put on 10 L of nonrebreather mask.  Code stroke was initiated.  He was febrile with a temperature of 102.1 F.  Initial labs showed hypokalemia with creatinine elevated at 2.1.  Alcohol level was less than 10.  Vitamin B12 of 671.  Chest x-ray with no acute cardiopulmonary disease findings.  COVID/flu/RSV PCR negative.  CT head without contrast with no acute intracranial abnormality.  CT angiogram head/neck with no emergent vascular finding, noted left carotid endarterectomy with widely patent internal carotid artery.  MRI  brain without contrast with no acute reversible finding, prominent atrophy and chronic ischemic injury.  Neurology was consulted, patient was given loading dose of IV Keppra 2 g.  Patient was then admitted to the hospital for acute metabolic encephalopathy concerning for breakthrough seizure, urinary tract infection    Hospital Course:   Following conditions were addressed during hospitalization as listed below,  Acute metabolic encephalopathy:  Improved at this time.  Likely multifactorial from seizures, UTI, hyperthermia on the background of dementia.   Hx seizure disorder with concern for breakthrough seizure  CT head, CT angiogram head/neck and MRI brain with no acute findings.   EEG with mild diffuse encephalopathy no seizures or epileptiform discharges noted.  Neurology was consulted initially and is on Keppra 500 mg p.o. twice daily.  Neurology has signed off at this time.  Plan for skilled nursing facility at this time.    Hypokalemia: Resolved, latest potassium of 4.2   Hypomagnesemia:    Was given magnesium sulfate IV yesterday, continue oral magnesium oxide for the next few days on discharge..      ESBL E. coli urinary tract infection  Urine culture: + >40K ESBL coli.  Blood cultures negative.  Was initially on Rocephin has been changed to ertapenem.  Completed 3-day course of ertapenem.   Hyperthermia likely secondary to environmental exposure: resolved Temperature 102.1 F on ED arrival, now has normalized.  Temperature max of 98.7 F   Hx CVA CT head, MRI brain negative for acute finding.  Continue Lipitor and Eliquis.   Mixed Alzheimer's and vascular dementia Delirium precautions.   Dysphagia Seen by speech therapy.  Recommend dysphagia 1, pured diet  with nectar thick liquid.  Advance diet at the facility as tolerated.   Persistent atrial fibrillation Essential hypertension Amlodipine currently on hold.  Continue Eliquis.   Hyperlipidemia Continue Lipitor   Hx  carotid artery stenosis  s/p left CEA   Mild AKI on CKD stage IIIa Creatinine 1.90>0.9, improved and at baseline.   COPD Continue albuterol.   BPH: Continue tamsulosin    Remote EtOH abuse Currently denies active use   Medical noncompliance Emphasized compliance.   Weakness/debility/deconditioning/gait disturbance: Seen by PT/OT, currently recommending skilled nursing facility.    Disposition.  At this time, patient is stable for disposition to skilled nursing facility.  Medical Consultants:   Neurology  Procedures:    EEG Subjective:   Today, patient was seen and examined at bedside.  Denies any dizziness, lightheadedness, nausea, vomiting has mild speech difficulty.  Discharge Exam:   Vitals:   07/18/23 0355 07/18/23 0732  BP: (!) 161/68 (!) 146/66  Pulse: (!) 48 (!) 48  Resp:  18  Temp: 97.6 F (36.4 C) 97.7 F (36.5 C)  SpO2: 96% 98%   Vitals:   07/17/23 1936 07/17/23 2342 07/18/23 0355 07/18/23 0732  BP: 131/68 (!) 146/84 (!) 161/68 (!) 146/66  Pulse: (!) 54 (!) 47 (!) 48 (!) 48  Resp: 18 18  18   Temp: 98.7 F (37.1 C) 97.7 F (36.5 C) 97.6 F (36.4 C) 97.7 F (36.5 C)  TempSrc: Oral Oral Oral Oral  SpO2: 97% 95% 96% 98%  Weight:      Height:      Body mass index is 22.62 kg/m.   General: Alert awake, not in obvious distress, elderly, appears chronically ill, HENT: pupils equally reacting to light,  No scleral pallor or icterus noted. Oral mucosa is moist.  Chest:  Clear breath sounds.  Diminished breath sounds bilaterally. No crackles or wheezes.  CVS: S1 &S2 heard. No murmur.  Regular rate and rhythm. Abdomen: Soft, nontender, nondistended.  Bowel sounds are heard.   Extremities: No cyanosis, clubbing or edema.  Peripheral pulses are palpable. Psych: Alert, awake and oriented, flat affect, speech difficulty, CNS:    Moves all extremities, dysarthric speech Skin: Warm and dry.  No rashes noted.  The results of significant diagnostics  from this hospitalization (including imaging, microbiology, ancillary and laboratory) are listed below for reference.     Diagnostic Studies:   MR BRAIN WO CONTRAST  Result Date: 07/12/2023 CLINICAL DATA:  Stroke follow-up EXAM: MRI HEAD WITHOUT CONTRAST TECHNIQUE: Multiplanar, multiecho pulse sequences of the brain and surrounding structures were obtained without intravenous contrast. COMPARISON:  Head CT from yesterday FINDINGS: Brain: No acute infarction, hemorrhage, hydrocephalus, extra-axial collection or mass lesion. Chronic superior left frontal infarct which is moderate to large. Moderate chronic right parietal cortex infarct. Small chronic right occipital cortex infarct. Ischemic gliosis in the cerebral white matter. Chronic lacunar infarct in the right paramedian pons. Pronounced brain atrophy. There are some chronic microhemorrhages primarily in the deep brain and likely ischemic. Vascular: Grossly preserved major flow voids Skull and upper cervical spine: No focal marrow lesion. Facet degeneration with C3-4 anterolisthesis. Sinuses/Orbits: Negative IMPRESSION: 1. No acute or reversible finding. 2. Prominent atrophy and chronic ischemic injury. Electronically Signed   By: Tiburcio Pea M.D.   On: 07/12/2023 06:28   DG CHEST PORT 1 VIEW  Result Date: 07/11/2023 CLINICAL DATA:  Respiratory distress chest EXAM: PORTABLE CHEST 1 VIEW COMPARISON:  Chest x-ray dated June 30, 2023 FINDINGS: Unchanged cardiac and mediastinal contours. Widening  of the right paratracheal region, unchanged when compared with the prior pleural priors and likely due to goiter or vascular tortuosity. Moderate hiatal hernia. No focal consolidation. No evidence of pleural effusion or pneumothorax. Old left-sided rib fractures. IMPRESSION: 1. No acute cardiopulmonary process. 2. Moderate hiatal hernia. Electronically Signed   By: Allegra Lai M.D.   On: 07/11/2023 16:39   EEG adult  Result Date: 07/11/2023 Charlsie Quest, MD     07/11/2023  8:12 PM Patient Name: TRAVYON COOLING MRN: [id number] Epilepsy Attending: Charlsie Quest Referring Physician/Provider: Marjorie Smolder, NP Date: 07/11/2023 Duration: 23.36 mins Patient history: 82yo M with left gaze deviation and right sided weakness getting eeg to evaluate for seizure. Level of alertness: Awake, asleep AEDs during EEG study: None Technical aspects: This EEG study was done with scalp electrodes positioned according to the 10-20 International system of electrode placement. Electrical activity was reviewed with band pass filter of 1-70Hz , sensitivity of 7 uV/mm, display speed of 29mm/sec with a 60Hz  notched filter applied as appropriate. EEG data were recorded continuously and digitally stored.  Video monitoring was available and reviewed as appropriate. Description: The posterior dominant rhythm consists of 7.5 Hz activity of moderate voltage (25-35 uV) seen predominantly in posterior head regions, symmetric and reactive to eye opening and eye closing. Sleep was characterized by vertex waves, sleep spindles (12 to 14 Hz), maximal frontocentral region. Intermittent generalized 3-6hz  theta-delta slowing was noted. Hyperventilation and photic stimulation were not performed.   ABNORMALITY - Intermittent slow, generalized IMPRESSION: This study is suggestive of mild diffuse encephalopathy. No seizures or epileptiform discharges were seen throughout the recording. Please note lack of epileptiform activity during interictal EEG does not exclude the diagnosis of epilepsy. Charlsie Quest   CT HEAD CODE STROKE WO CONTRAST  Result Date: 07/11/2023 CLINICAL DATA:  Code stroke. Left gaze, right-sided weakness. Responsive. EXAM: CT ANGIOGRAPHY HEAD AND NECK TECHNIQUE: Multidetector CT imaging of the head and neck was performed using the standard protocol during bolus administration of intravenous contrast. Multiplanar CT image reconstructions and MIPs were obtained to  evaluate the vascular anatomy. Carotid stenosis measurements (when applicable) are obtained utilizing NASCET criteria, using the distal internal carotid diameter as the denominator. RADIATION DOSE REDUCTION: This exam was performed according to the departmental dose-optimization program which includes automated exposure control, adjustment of the mA and/or kV according to patient size and/or use of iterative reconstruction technique. CONTRAST:  75mL OMNIPAQUE IOHEXOL 350 MG/ML SOLN COMPARISON:  CT head 06/30/2023, CTA head and neck 02/18/2023 FINDINGS: CT HEAD FINDINGS Brain: There is no acute intracranial hemorrhage, extra-axial fluid collection, or acute infarct There is unchanged parenchymal volume loss with prominence of the ventricular system and extra-axial CSF spaces. The ventricles are stable in size. The remote cortical infarcts in the left frontal lobe posteriorly and right parietal lobe and small remote infarcts in the left corona radiata are unchanged. The pituitary and suprasellar region are normal. There is no mass lesion. There is no mass effect or midline shift. Vascular: See below. Skull: Normal. Negative for fracture or focal lesion. Sinuses/Orbits: There is mild mucosal thickening in the paranasal sinuses. The globes and orbits are unremarkable. Other: The mastoid air cells and middle ear cavities are clear. ASPECTS [facility] Stroke Program Early CT Score) - Ganglionic level infarction (caudate, lentiform nuclei, internal capsule, insula, M1-M3 cortex): 7 - Supraganglionic infarction (M4-M6 cortex): 3 Total score (0-10 with 10 being normal): 10 CTA NECK FINDINGS Aortic arch: Is mild  calcified plaque in the aortic arch. The origins of the major branch vessels are patent with calcified plaque. The subclavian arteries are patent to the level imaged. Right carotid system: The right common carotid artery is patent with scattered calcified plaque but no hemodynamically significant stenosis or occlusion.  There is calcified plaque at the bifurcation and in the proximal internal carotid artery without hemodynamically significant stenosis or occlusion. There is no evidence of dissection or aneurysm. Left carotid system: The left common carotid artery is patent with scattered calcified plaque. The patient is status post left carotid endarterectomy. The internal carotid artery is widely patent. A small linear filling defect in the proximal external carotid artery is unchanged (7-199). There is no evidence of new dissection or aneurysm. Vertebral arteries: Moderate stenosis at the origin of the right vertebral artery is unchanged. The vertebral arteries are otherwise patent, without other hemodynamically significant stenosis or occlusion. There is no evidence of dissection or aneurysm. Skeleton: There is no acute osseous abnormality or suspicious osseous lesion. There is unchanged multilevel disc and facet degeneration throughout the cervical spine with mild anterolisthesis of C3 on C4 and C7 on T1. There is no visible canal hematoma. Other neck: The soft tissues of the neck are unremarkable. Upper chest: The imaged lung apices are clear. Review of the MIP images confirms the above findings CTA HEAD FINDINGS Anterior circulation: There is unchanged calcified plaque in the intracranial ICAs resulting in mild-to-moderate stenosis bilaterally. The bilateral MCAs and ACAS are patent, without proximal stenosis or occlusion. There is no aneurysm or AVM. Posterior circulation: There is calcified plaque in the non dominant right V4 segment resulting in mild-to-moderate stenosis. The right vertebral artery is diminutive but patent after the PICA origin. More bulky plaque in the left V4 segment resulting in mild-to-moderate stenosis is unchanged. The basilar artery is patent. The major cerebellar arteries appear patent. The bilateral PCAs are patent, without proximal stenosis or occlusion. A left posterior communicating artery is  identified. There is no aneurysm or AVM. Venous sinuses: Suboptimally evaluated due to bolus timing. Anatomic variants: None. Review of the MIP images confirms the above findings IMPRESSION: 1. Stable noncontrast head CT with no acute intracranial pathology. 2. No emergent vascular finding. 3. Calcified plaque in the right carotid system without hemodynamically significant stenosis. 4. Status post left carotid endarterectomy with widely patent internal carotid artery. A small linear filling defect in the proximal external carotid artery is unchanged. 5. Unchanged calcified plaque in the intracranial ICAs and V4 segments without high-grade stenosis or occlusion, and moderate stenosis at the right vertebral artery origin. Findings of the initial noncontrast head CT communicated to Dr Roda Shutters at 3:50 pm. CTA findings communicated at 3:59 pm. Electronically Signed   By: Lesia Hausen M.D.   On: 07/11/2023 16:08   CT ANGIO HEAD NECK W WO CM (CODE STROKE)  Result Date: 07/11/2023 CLINICAL DATA:  Code stroke. Left gaze, right-sided weakness. Responsive. EXAM: CT ANGIOGRAPHY HEAD AND NECK TECHNIQUE: Multidetector CT imaging of the head and neck was performed using the standard protocol during bolus administration of intravenous contrast. Multiplanar CT image reconstructions and MIPs were obtained to evaluate the vascular anatomy. Carotid stenosis measurements (when applicable) are obtained utilizing NASCET criteria, using the distal internal carotid diameter as the denominator. RADIATION DOSE REDUCTION: This exam was performed according to the departmental dose-optimization program which includes automated exposure control, adjustment of the mA and/or kV according to patient size and/or use of iterative reconstruction technique. CONTRAST:  75mL OMNIPAQUE  IOHEXOL 350 MG/ML SOLN COMPARISON:  CT head 06/30/2023, CTA head and neck 02/18/2023 FINDINGS: CT HEAD FINDINGS Brain: There is no acute intracranial hemorrhage, extra-axial  fluid collection, or acute infarct There is unchanged parenchymal volume loss with prominence of the ventricular system and extra-axial CSF spaces. The ventricles are stable in size. The remote cortical infarcts in the left frontal lobe posteriorly and right parietal lobe and small remote infarcts in the left corona radiata are unchanged. The pituitary and suprasellar region are normal. There is no mass lesion. There is no mass effect or midline shift. Vascular: See below. Skull: Normal. Negative for fracture or focal lesion. Sinuses/Orbits: There is mild mucosal thickening in the paranasal sinuses. The globes and orbits are unremarkable. Other: The mastoid air cells and middle ear cavities are clear. ASPECTS ([place] Stroke Program Early CT Score) - Ganglionic level infarction (caudate, lentiform nuclei, internal capsule, insula, M1-M3 cortex): 7 - Supraganglionic infarction (M4-M6 cortex): 3 Total score (0-10 with 10 being normal): 10 CTA NECK FINDINGS Aortic arch: Is mild calcified plaque in the aortic arch. The origins of the major branch vessels are patent with calcified plaque. The subclavian arteries are patent to the level imaged. Right carotid system: The right common carotid artery is patent with scattered calcified plaque but no hemodynamically significant stenosis or occlusion. There is calcified plaque at the bifurcation and in the proximal internal carotid artery without hemodynamically significant stenosis or occlusion. There is no evidence of dissection or aneurysm. Left carotid system: The left common carotid artery is patent with scattered calcified plaque. The patient is status post left carotid endarterectomy. The internal carotid artery is widely patent. A small linear filling defect in the proximal external carotid artery is unchanged (7-199). There is no evidence of new dissection or aneurysm. Vertebral arteries: Moderate stenosis at the origin of the right vertebral artery is unchanged. The  vertebral arteries are otherwise patent, without other hemodynamically significant stenosis or occlusion. There is no evidence of dissection or aneurysm. Skeleton: There is no acute osseous abnormality or suspicious osseous lesion. There is unchanged multilevel disc and facet degeneration throughout the cervical spine with mild anterolisthesis of C3 on C4 and C7 on T1. There is no visible canal hematoma. Other neck: The soft tissues of the neck are unremarkable. Upper chest: The imaged lung apices are clear. Review of the MIP images confirms the above findings CTA HEAD FINDINGS Anterior circulation: There is unchanged calcified plaque in the intracranial ICAs resulting in mild-to-moderate stenosis bilaterally. The bilateral MCAs and ACAS are patent, without proximal stenosis or occlusion. There is no aneurysm or AVM. Posterior circulation: There is calcified plaque in the non dominant right V4 segment resulting in mild-to-moderate stenosis. The right vertebral artery is diminutive but patent after the PICA origin. More bulky plaque in the left V4 segment resulting in mild-to-moderate stenosis is unchanged. The basilar artery is patent. The major cerebellar arteries appear patent. The bilateral PCAs are patent, without proximal stenosis or occlusion. A left posterior communicating artery is identified. There is no aneurysm or AVM. Venous sinuses: Suboptimally evaluated due to bolus timing. Anatomic variants: None. Review of the MIP images confirms the above findings IMPRESSION: 1. Stable noncontrast head CT with no acute intracranial pathology. 2. No emergent vascular finding. 3. Calcified plaque in the right carotid system without hemodynamically significant stenosis. 4. Status post left carotid endarterectomy with widely patent internal carotid artery. A small linear filling defect in the proximal external carotid artery is unchanged. 5. Unchanged calcified plaque  in the intracranial ICAs and V4 segments without  high-grade stenosis or occlusion, and moderate stenosis at the right vertebral artery origin. Findings of the initial noncontrast head CT communicated to Dr Roda Shutters at 3:50 pm. CTA findings communicated at 3:59 pm. Electronically Signed   By: Lesia Hausen M.D.   On: 07/11/2023 16:08     Labs:   Basic Metabolic Panel: Recent Labs  Lab 07/12/23 0246 07/13/23 0407 07/14/23 0438 07/14/23 2357 07/16/23 0525 07/17/23 0136 07/18/23 0554  NA 136 131* 132* 134* 136 135  --   K 3.5 3.5 3.9 4.2 3.8 4.2  --   CL 110 106 107 105 107 105  --   CO2 17* 16* 17* 19* 21* 24  --   GLUCOSE 103* 91 98 102* 93 86  --   BUN 21 15 16 16 12 12   --   CREATININE 1.90* 1.30* 1.14 1.09 1.20 0.92 1.00  CALCIUM 8.5* 8.6* 9.0 9.2 9.2 9.4  --   MG 1.3* 1.1* 2.0 1.6* 1.5* 2.0  --   PHOS 2.9  --   --   --  3.0  --   --    GFR Estimated Creatinine Clearance: 51.9 mL/min (by C-G formula based on SCr of 1 mg/dL). Liver Function Tests: Recent Labs  Lab 07/11/23 1725 07/12/23 0246  AST 16 73*  ALT 13 21  ALKPHOS 71 68  BILITOT 0.6 0.3  PROT 6.3* 6.3*  ALBUMIN 2.8* 2.9*   No results for input(s): "LIPASE", "AMYLASE" in the last 168 hours. Recent Labs  Lab 07/11/23 1704  AMMONIA 20   Coagulation profile Recent Labs  Lab 07/11/23 1725  INR 2.4*    CBC: Recent Labs  Lab 07/11/23 1540 07/11/23 1725 07/12/23 0246 07/13/23 0407  WBC  --  11.2* 10.9* 9.8  NEUTROABS  --  9.4* 8.4*  --   HGB 9.9* 9.8* 9.2* 8.8*  HCT 29.0* 29.3* 28.0* 27.0*  MCV  --  90.4 88.6 88.5  PLT  --  223 236 230   Cardiac Enzymes: Recent Labs  Lab 07/11/23 1725  CKTOTAL 156   BNP: Invalid input(s): "POCBNP" CBG: Recent Labs  Lab 07/11/23 1535  GLUCAP 130*   D-Dimer No results for input(s): "DDIMER" in the last 72 hours. Hgb A1c No results for input(s): "HGBA1C" in the last 72 hours. Lipid Profile No results for input(s): "CHOL", "HDL", "LDLCALC", "TRIG", "CHOLHDL", "LDLDIRECT" in the last 72 hours. Thyroid  function studies No results for input(s): "TSH", "T4TOTAL", "T3FREE", "THYROIDAB" in the last 72 hours.  Invalid input(s): "FREET3" Anemia work up No results for input(s): "VITAMINB12", "FOLATE", "FERRITIN", "TIBC", "IRON", "RETICCTPCT" in the last 72 hours. Microbiology Recent Results (from the past 240 hour(s))  Blood culture (routine x 2)     Status: None   Collection Time: 07/11/23  5:04 PM   Specimen: BLOOD  Result Value Ref Range Status   Specimen Description BLOOD SITE NOT SPECIFIED  Final   Special Requests   Final    BOTTLES DRAWN AEROBIC AND ANAEROBIC Blood Culture adequate volume   Culture   Final    NO GROWTH 5 DAYS Performed at [facility], [address], [place], [place] [address]    Report Status 07/16/2023 FINAL  Final  Blood culture (routine x 2)     Status: None   Collection Time: 07/11/23  5:11 PM   Specimen: BLOOD  Result Value Ref Range Status   Specimen Description BLOOD SITE NOT SPECIFIED  Final   Special Requests   Final    BOTTLES DRAWN AEROBIC AND ANAEROBIC Blood Culture adequate volume   Culture   Final    NO GROWTH 5 DAYS Performed at [facility], [address], [place], [place] [address]    Report Status 07/16/2023 FINAL  Final  Resp panel by RT-PCR (RSV, Flu A&B, Covid) Anterior Nasal Swab     Status: None   Collection Time: 07/11/23  8:25 PM   Specimen: Anterior Nasal Swab  Result Value Ref Range Status   SARS Coronavirus 2 by RT PCR NEGATIVE NEGATIVE Final   Influenza A by PCR NEGATIVE NEGATIVE Final   Influenza B by PCR NEGATIVE NEGATIVE Final    Comment: (NOTE) The Xpert Xpress SARS-CoV-2/FLU/RSV plus assay is intended as an aid in the diagnosis of influenza from Nasopharyngeal swab specimens and should not be used as a sole basis for treatment. Nasal washings and aspirates are unacceptable for Xpert Xpress SARS-CoV-2/FLU/RSV testing.  Fact Sheet for Patients: BloggerCourse.com  Fact Sheet for  Healthcare Providers: SeriousBroker.it  This test is not yet approved or cleared by the [place] FDA and has been authorized for detection and/or diagnosis of SARS-CoV-2 by FDA under an Emergency Use Authorization (EUA). This EUA will remain in effect (meaning this test can be used) for the duration of the COVID-19 declaration under Section 564(b)(1) of the Act, 21 U.S.C. section 360bbb-3(b)(1), unless the authorization is terminated or revoked.     Resp Syncytial Virus by PCR NEGATIVE NEGATIVE Final    Comment: (NOTE) Fact Sheet for Patients: BloggerCourse.com  Fact Sheet for Healthcare Providers: SeriousBroker.it  This test is not yet approved or cleared by the [place] FDA and has been authorized for detection and/or diagnosis of SARS-CoV-2 by FDA under an Emergency Use Authorization (EUA). This EUA will remain in effect (meaning this test can be used) for the duration of the COVID-19 declaration under Section 564(b)(1) of the Act, 21 U.S.C. section 360bbb-3(b)(1), unless the authorization is terminated or revoked.  Performed at [facility], [address] [place], [place] [address]   Culture, blood (x 2)     Status: None   Collection Time: 07/12/23  2:40 AM   Specimen: BLOOD RIGHT HAND  Result Value Ref Range Status   Specimen Description BLOOD RIGHT HAND  Final   Special Requests   Final    BOTTLES DRAWN AEROBIC AND ANAEROBIC Blood Culture adequate volume   Culture   Final    NO GROWTH 5 DAYS Performed at [facility], [address], [place], [place] [address]    Report Status 07/17/2023 FINAL  Final  Culture, blood (x 2)     Status: None   Collection Time: 07/12/23  2:50 AM   Specimen: BLOOD RIGHT ARM  Result Value Ref Range Status   Specimen Description BLOOD RIGHT ARM  Final   Special Requests   Final    BOTTLES DRAWN AEROBIC AND ANAEROBIC Blood Culture results  may not be optimal due to an excessive volume of blood received in culture bottles   Culture   Final    NO GROWTH 5 DAYS Performed at [facility], [address] [place], [place] [address]    Report Status 07/17/2023 FINAL  Final  MRSA Next Gen by PCR, Nasal     Status: None   Collection Time: 07/12/23  7:00 AM   Specimen: Nasal Mucosa; Nasal Swab  Result Value Ref Range Status  MRSA by PCR Next Gen NOT DETECTED NOT DETECTED Final    Comment: (NOTE) The GeneXpert MRSA Assay (FDA approved for NASAL specimens only), is one component of a comprehensive MRSA colonization surveillance program. It is not intended to diagnose MRSA infection nor to guide or monitor treatment for MRSA infections. Test performance is not FDA approved in patients less than 34 years old. Performed at [facility], [address] [place], [place] [address]   Urine Culture (for pregnant, neutropenic or urologic patients or patients with an indwelling urinary catheter)     Status: Abnormal   Collection Time: 07/12/23  8:21 AM   Specimen: Urine, Clean Catch  Result Value Ref Range Status   Specimen Description URINE, CLEAN CATCH  Final   Special Requests   Final    NONE Performed at [facility], [address], [place], [place] [address]    Culture (A)  Final    40,000 COLONIES/mL ESCHERICHIA COLI Confirmed Extended Spectrum Beta-Lactamase Producer (ESBL).  In bloodstream infections from ESBL organisms, carbapenems are preferred over piperacillin/tazobactam. They are shown to have a lower risk of mortality.    Report Status 07/15/2023 FINAL  Final   Organism ID, Bacteria ESCHERICHIA COLI (A)  Final      Susceptibility   Escherichia coli - MIC*    AMPICILLIN >=32 RESISTANT Resistant     CEFAZOLIN >=64 RESISTANT Resistant     CEFEPIME 0.5 SENSITIVE Sensitive     CEFTRIAXONE 16 RESISTANT Resistant     CIPROFLOXACIN >=4 RESISTANT Resistant     GENTAMICIN <=1 SENSITIVE Sensitive      IMIPENEM <=0.25 SENSITIVE Sensitive     NITROFURANTOIN 128 RESISTANT Resistant     TRIMETH/SULFA >=320 RESISTANT Resistant     AMPICILLIN/SULBACTAM 16 INTERMEDIATE Intermediate     PIP/TAZO <=4 SENSITIVE Sensitive     * 40,000 COLONIES/mL ESCHERICHIA COLI     Discharge Instructions:   Discharge Instructions     Discharge instructions   Complete by: As directed    Follow-up with your primary care provider at the skilled nursing facility in 3 to 5 days.  Check blood work at that time.  Seek medical attention for worsening symptoms.   Increase activity slowly   Complete by: As directed       Allergies as of 07/18/2023   No Known Allergies      Medication List     TAKE these medications    acetaminophen 500 MG tablet Commonly known as: TYLENOL Take 1,000 mg by mouth 2 (two) times daily as needed for moderate pain, fever or headache.   albuterol (2.5 MG/3ML) 0.083% nebulizer solution Commonly known as: PROVENTIL Take 3 mLs (2.5 mg total) by nebulization every 6 (six) hours as needed for wheezing or shortness of breath.   amLODipine 10 MG tablet Commonly known as: NORVASC Take 1 tablet (10 mg total) by mouth daily.   apixaban 5 MG Tabs tablet Commonly known as: ELIQUIS Take 1 tablet (5 mg total) by mouth 2 (two) times daily.   ascorbic acid 500 MG tablet Commonly known as: VITAMIN C Take 500 mg by mouth daily.   atorvastatin 20 MG tablet Commonly known as: LIPITOR Take 20 mg by mouth every evening.   bisacodyl 5 MG EC tablet Commonly known as: DULCOLAX Take 1 tablet (5 mg total) by mouth daily as needed for severe constipation.   levETIRAcetam 500 MG tablet Commonly known as: KEPPRA Take 1 tablet (500 mg total) by mouth 2 (  two) times daily.   magnesium oxide 400 (240 Mg) MG tablet Commonly known as: MAG-OX Take 1 tablet (400 mg total) by mouth daily for 10 days.   ondansetron 4 MG disintegrating tablet Commonly known as: ZOFRAN-ODT Take 4 mg by mouth  every 8 (eight) hours as needed for nausea or vomiting.   pantoprazole 40 MG tablet Commonly known as: Protonix Take 1 tablet (40 mg total) by mouth daily.   senna-docusate 8.6-50 MG tablet Commonly known as: Senokot-S Take 1 tablet by mouth at bedtime.   tamsulosin 0.4 MG Caps capsule Commonly known as: FLOMAX Take 1 capsule (0.4 mg total) by mouth daily after breakfast.   traZODone 100 MG tablet Commonly known as: DESYREL Take 1 tablet (100 mg total) by mouth at bedtime.   vitamin B-12 500 MCG tablet Commonly known as: CYANOCOBALAMIN Take 500 mcg by mouth daily.          Time coordinating discharge: 39 minutes  Signed:  Abbey Veith  [facility] 07/18/2023, 10:08 AM

## 2023-07-18 NOTE — Progress Notes (Signed)
Speech Language Pathology Treatment: Dysphagia  Patient Details Name: DEVOE ESH MRN: [id number] DOB: Dec 31, 1940 Today's Date: 07/18/2023 Time: 3244-0102 SLP Time Calculation (min) (ACUTE ONLY): 10 min  Assessment / Plan / Recommendation Clinical Impression  Pt positioned upright in bed and observed with trials of Dys 1 textures and honey thick liquids with no overt s/s of dysphagia or aspiration. Pt unable to recall SLP recommendations and need for diet modification. Question his ability to initiate use of a chin tuck in order to upgrade to nectar thick liquids. Suspect pt's presentation is likely an exacerbation of baseline dysphagia that will hopefully resolve as medical condition improves. Recommend continuing diet of Dys 1 textures with honey thick liquids and full supervision. Pt will benefit from continued SLP f/u to assess pt's ability to consistently use compensatory strategies and upgrade diet accordingly.    HPI HPI: 82 y.o. male presents to the emergency department 8/16 for evaluation of code stroke.  Patient was reportedly found altered and down on his front porch lying in the sun. MRI negative for acute stroke.  PMHx: remote alcohol abuse, dementia, GERD, hypertension, hyperlipidemia, seizures, ischemic CVA, atrial fibrillation on Eliquis.      SLP Plan  Continue with current plan of care      Recommendations for follow up therapy are one component of a multi-disciplinary discharge planning process, led by the attending physician.  Recommendations may be updated based on patient status, additional functional criteria and insurance authorization.    Recommendations  Diet recommendations: Dysphagia 1 (puree);Honey-thick liquid Liquids provided via: No straw;Cup Medication Administration: Crushed with puree Supervision: Patient able to self feed;Full supervision/cueing for compensatory strategies Compensations: Small sips/bites;Slow rate Postural Changes and/or Swallow  Maneuvers: Seated upright 90 degrees                  Oral care BID   Intermittent Supervision/Assistance Dysphagia, oropharyngeal phase (R13.12)     Continue with current plan of care     Gwynneth Aliment, M.A., CF-SLP Speech Language Pathology, Acute Rehabilitation Services  Secure Chat preferred [phone]   07/18/2023, 11:01 AM

## 2023-07-21 DIAGNOSIS — I10 Essential (primary) hypertension: Secondary | ICD-10-CM | POA: Diagnosis not present

## 2023-07-21 DIAGNOSIS — K219 Gastro-esophageal reflux disease without esophagitis: Secondary | ICD-10-CM | POA: Diagnosis not present

## 2023-07-21 DIAGNOSIS — I69354 Hemiplegia and hemiparesis following cerebral infarction affecting left non-dominant side: Secondary | ICD-10-CM | POA: Diagnosis not present

## 2023-07-21 DIAGNOSIS — I480 Paroxysmal atrial fibrillation: Secondary | ICD-10-CM | POA: Diagnosis not present

## 2023-07-21 DIAGNOSIS — I639 Cerebral infarction, unspecified: Secondary | ICD-10-CM | POA: Diagnosis not present

## 2023-07-21 DIAGNOSIS — G40909 Epilepsy, unspecified, not intractable, without status epilepticus: Secondary | ICD-10-CM | POA: Diagnosis not present

## 2023-07-21 DIAGNOSIS — E785 Hyperlipidemia, unspecified: Secondary | ICD-10-CM | POA: Diagnosis not present

## 2023-07-21 DIAGNOSIS — R1310 Dysphagia, unspecified: Secondary | ICD-10-CM | POA: Diagnosis not present

## 2023-07-23 DIAGNOSIS — G9341 Metabolic encephalopathy: Secondary | ICD-10-CM | POA: Diagnosis not present

## 2023-07-23 DIAGNOSIS — G40909 Epilepsy, unspecified, not intractable, without status epilepticus: Secondary | ICD-10-CM | POA: Diagnosis not present

## 2023-07-23 DIAGNOSIS — I69354 Hemiplegia and hemiparesis following cerebral infarction affecting left non-dominant side: Secondary | ICD-10-CM | POA: Diagnosis not present

## 2023-07-23 DIAGNOSIS — I639 Cerebral infarction, unspecified: Secondary | ICD-10-CM | POA: Diagnosis not present

## 2023-07-23 DIAGNOSIS — D649 Anemia, unspecified: Secondary | ICD-10-CM | POA: Diagnosis not present

## 2023-07-23 DIAGNOSIS — N400 Enlarged prostate without lower urinary tract symptoms: Secondary | ICD-10-CM | POA: Diagnosis not present

## 2023-07-23 DIAGNOSIS — I480 Paroxysmal atrial fibrillation: Secondary | ICD-10-CM | POA: Diagnosis not present

## 2023-07-23 DIAGNOSIS — N390 Urinary tract infection, site not specified: Secondary | ICD-10-CM | POA: Diagnosis not present

## 2023-07-29 DIAGNOSIS — I10 Essential (primary) hypertension: Secondary | ICD-10-CM | POA: Diagnosis not present

## 2023-07-29 DIAGNOSIS — K922 Gastrointestinal hemorrhage, unspecified: Secondary | ICD-10-CM | POA: Diagnosis not present

## 2023-07-29 DIAGNOSIS — G4700 Insomnia, unspecified: Secondary | ICD-10-CM | POA: Diagnosis not present

## 2023-07-29 DIAGNOSIS — J449 Chronic obstructive pulmonary disease, unspecified: Secondary | ICD-10-CM | POA: Diagnosis not present

## 2023-07-29 DIAGNOSIS — I480 Paroxysmal atrial fibrillation: Secondary | ICD-10-CM | POA: Diagnosis not present

## 2023-07-29 DIAGNOSIS — G40909 Epilepsy, unspecified, not intractable, without status epilepticus: Secondary | ICD-10-CM | POA: Diagnosis not present

## 2023-07-29 DIAGNOSIS — R52 Pain, unspecified: Secondary | ICD-10-CM | POA: Diagnosis not present

## 2023-07-29 DIAGNOSIS — N400 Enlarged prostate without lower urinary tract symptoms: Secondary | ICD-10-CM | POA: Diagnosis not present

## 2023-08-04 DIAGNOSIS — R1312 Dysphagia, oropharyngeal phase: Secondary | ICD-10-CM | POA: Diagnosis not present

## 2023-08-04 DIAGNOSIS — F0280 Dementia in other diseases classified elsewhere without behavioral disturbance: Secondary | ICD-10-CM | POA: Diagnosis not present

## 2023-08-04 DIAGNOSIS — I69354 Hemiplegia and hemiparesis following cerebral infarction affecting left non-dominant side: Secondary | ICD-10-CM | POA: Diagnosis not present

## 2023-08-04 DIAGNOSIS — E43 Unspecified severe protein-calorie malnutrition: Secondary | ICD-10-CM | POA: Diagnosis not present

## 2023-08-04 DIAGNOSIS — F0150 Vascular dementia without behavioral disturbance: Secondary | ICD-10-CM | POA: Diagnosis not present

## 2023-08-04 DIAGNOSIS — I69391 Dysphagia following cerebral infarction: Secondary | ICD-10-CM | POA: Diagnosis not present

## 2023-08-04 DIAGNOSIS — G309 Alzheimer's disease, unspecified: Secondary | ICD-10-CM | POA: Diagnosis not present

## 2023-08-06 DIAGNOSIS — R1312 Dysphagia, oropharyngeal phase: Secondary | ICD-10-CM | POA: Diagnosis not present

## 2023-08-06 DIAGNOSIS — I69354 Hemiplegia and hemiparesis following cerebral infarction affecting left non-dominant side: Secondary | ICD-10-CM | POA: Diagnosis not present

## 2023-08-06 DIAGNOSIS — F0280 Dementia in other diseases classified elsewhere without behavioral disturbance: Secondary | ICD-10-CM | POA: Diagnosis not present

## 2023-08-06 DIAGNOSIS — F0150 Vascular dementia without behavioral disturbance: Secondary | ICD-10-CM | POA: Diagnosis not present

## 2023-08-06 DIAGNOSIS — E43 Unspecified severe protein-calorie malnutrition: Secondary | ICD-10-CM | POA: Diagnosis not present

## 2023-08-06 DIAGNOSIS — G309 Alzheimer's disease, unspecified: Secondary | ICD-10-CM | POA: Diagnosis not present

## 2023-08-06 DIAGNOSIS — G40909 Epilepsy, unspecified, not intractable, without status epilepticus: Secondary | ICD-10-CM | POA: Diagnosis not present

## 2023-08-06 DIAGNOSIS — I480 Paroxysmal atrial fibrillation: Secondary | ICD-10-CM | POA: Diagnosis not present

## 2023-08-06 DIAGNOSIS — I69391 Dysphagia following cerebral infarction: Secondary | ICD-10-CM | POA: Diagnosis not present

## 2023-08-11 DIAGNOSIS — Z23 Encounter for immunization: Secondary | ICD-10-CM | POA: Diagnosis not present

## 2023-08-11 DIAGNOSIS — Z87440 Personal history of urinary (tract) infections: Secondary | ICD-10-CM | POA: Diagnosis not present

## 2023-08-11 DIAGNOSIS — E7800 Pure hypercholesterolemia, unspecified: Secondary | ICD-10-CM | POA: Diagnosis not present

## 2023-08-11 DIAGNOSIS — N390 Urinary tract infection, site not specified: Secondary | ICD-10-CM | POA: Diagnosis not present

## 2023-08-11 DIAGNOSIS — D638 Anemia in other chronic diseases classified elsewhere: Secondary | ICD-10-CM | POA: Diagnosis not present

## 2023-08-11 DIAGNOSIS — G9340 Encephalopathy, unspecified: Secondary | ICD-10-CM | POA: Diagnosis not present

## 2023-08-11 DIAGNOSIS — F0390 Unspecified dementia without behavioral disturbance: Secondary | ICD-10-CM | POA: Diagnosis not present

## 2023-08-28 DIAGNOSIS — G309 Alzheimer's disease, unspecified: Secondary | ICD-10-CM | POA: Diagnosis not present

## 2023-08-28 DIAGNOSIS — I69391 Dysphagia following cerebral infarction: Secondary | ICD-10-CM | POA: Diagnosis not present

## 2023-08-28 DIAGNOSIS — F0280 Dementia in other diseases classified elsewhere without behavioral disturbance: Secondary | ICD-10-CM | POA: Diagnosis not present

## 2023-08-28 DIAGNOSIS — G40909 Epilepsy, unspecified, not intractable, without status epilepticus: Secondary | ICD-10-CM | POA: Diagnosis not present

## 2023-08-28 DIAGNOSIS — F0150 Vascular dementia without behavioral disturbance: Secondary | ICD-10-CM | POA: Diagnosis not present

## 2023-08-28 DIAGNOSIS — E43 Unspecified severe protein-calorie malnutrition: Secondary | ICD-10-CM | POA: Diagnosis not present

## 2023-08-28 DIAGNOSIS — R1312 Dysphagia, oropharyngeal phase: Secondary | ICD-10-CM | POA: Diagnosis not present

## 2023-08-28 DIAGNOSIS — I480 Paroxysmal atrial fibrillation: Secondary | ICD-10-CM | POA: Diagnosis not present

## 2023-08-28 DIAGNOSIS — I69354 Hemiplegia and hemiparesis following cerebral infarction affecting left non-dominant side: Secondary | ICD-10-CM | POA: Diagnosis not present

## 2023-09-01 DIAGNOSIS — I69354 Hemiplegia and hemiparesis following cerebral infarction affecting left non-dominant side: Secondary | ICD-10-CM | POA: Diagnosis not present

## 2023-09-01 DIAGNOSIS — G40909 Epilepsy, unspecified, not intractable, without status epilepticus: Secondary | ICD-10-CM | POA: Diagnosis not present

## 2023-09-01 DIAGNOSIS — I69391 Dysphagia following cerebral infarction: Secondary | ICD-10-CM | POA: Diagnosis not present

## 2023-09-01 DIAGNOSIS — I480 Paroxysmal atrial fibrillation: Secondary | ICD-10-CM | POA: Diagnosis not present

## 2023-09-01 DIAGNOSIS — G309 Alzheimer's disease, unspecified: Secondary | ICD-10-CM | POA: Diagnosis not present

## 2023-09-01 DIAGNOSIS — F0150 Vascular dementia without behavioral disturbance: Secondary | ICD-10-CM | POA: Diagnosis not present

## 2023-09-01 DIAGNOSIS — E43 Unspecified severe protein-calorie malnutrition: Secondary | ICD-10-CM | POA: Diagnosis not present

## 2023-09-01 DIAGNOSIS — F0280 Dementia in other diseases classified elsewhere without behavioral disturbance: Secondary | ICD-10-CM | POA: Diagnosis not present

## 2023-09-01 DIAGNOSIS — R1312 Dysphagia, oropharyngeal phase: Secondary | ICD-10-CM | POA: Diagnosis not present

## 2023-09-02 DIAGNOSIS — G40909 Epilepsy, unspecified, not intractable, without status epilepticus: Secondary | ICD-10-CM | POA: Diagnosis not present

## 2023-09-02 DIAGNOSIS — F0150 Vascular dementia without behavioral disturbance: Secondary | ICD-10-CM | POA: Diagnosis not present

## 2023-09-02 DIAGNOSIS — I69354 Hemiplegia and hemiparesis following cerebral infarction affecting left non-dominant side: Secondary | ICD-10-CM | POA: Diagnosis not present

## 2023-09-02 DIAGNOSIS — I480 Paroxysmal atrial fibrillation: Secondary | ICD-10-CM | POA: Diagnosis not present

## 2023-09-02 DIAGNOSIS — R1312 Dysphagia, oropharyngeal phase: Secondary | ICD-10-CM | POA: Diagnosis not present

## 2023-09-02 DIAGNOSIS — G309 Alzheimer's disease, unspecified: Secondary | ICD-10-CM | POA: Diagnosis not present

## 2023-09-02 DIAGNOSIS — F0280 Dementia in other diseases classified elsewhere without behavioral disturbance: Secondary | ICD-10-CM | POA: Diagnosis not present

## 2023-09-02 DIAGNOSIS — I69391 Dysphagia following cerebral infarction: Secondary | ICD-10-CM | POA: Diagnosis not present

## 2023-09-02 DIAGNOSIS — E43 Unspecified severe protein-calorie malnutrition: Secondary | ICD-10-CM | POA: Diagnosis not present

## 2023-09-04 ENCOUNTER — Other Ambulatory Visit: Payer: Self-pay

## 2023-09-04 ENCOUNTER — Emergency Department: Payer: Medicare HMO

## 2023-09-04 ENCOUNTER — Inpatient Hospital Stay
Admission: EM | Admit: 2023-09-04 | Discharge: 2023-09-10 | DRG: 871 | Disposition: A | Discharge status: Home Health | Payer: Medicare HMO | Attending: Osteopathic Medicine | Admitting: Osteopathic Medicine

## 2023-09-04 DIAGNOSIS — I2510 Atherosclerotic heart disease of native coronary artery without angina pectoris: Secondary | ICD-10-CM | POA: Diagnosis not present

## 2023-09-04 DIAGNOSIS — N3001 Acute cystitis with hematuria: Secondary | ICD-10-CM | POA: Diagnosis not present

## 2023-09-04 DIAGNOSIS — S2243XA Multiple fractures of ribs, bilateral, initial encounter for closed fracture: Secondary | ICD-10-CM | POA: Diagnosis not present

## 2023-09-04 DIAGNOSIS — J440 Chronic obstructive pulmonary disease with acute lower respiratory infection: Secondary | ICD-10-CM | POA: Diagnosis present

## 2023-09-04 DIAGNOSIS — Z823 Family history of stroke: Secondary | ICD-10-CM | POA: Diagnosis not present

## 2023-09-04 DIAGNOSIS — I69391 Dysphagia following cerebral infarction: Secondary | ICD-10-CM | POA: Diagnosis not present

## 2023-09-04 DIAGNOSIS — R1312 Dysphagia, oropharyngeal phase: Secondary | ICD-10-CM | POA: Diagnosis not present

## 2023-09-04 DIAGNOSIS — E43 Unspecified severe protein-calorie malnutrition: Secondary | ICD-10-CM | POA: Diagnosis not present

## 2023-09-04 DIAGNOSIS — F0150 Vascular dementia without behavioral disturbance: Secondary | ICD-10-CM | POA: Diagnosis not present

## 2023-09-04 DIAGNOSIS — J189 Pneumonia, unspecified organism: Secondary | ICD-10-CM | POA: Diagnosis not present

## 2023-09-04 DIAGNOSIS — Z87891 Personal history of nicotine dependence: Secondary | ICD-10-CM | POA: Diagnosis not present

## 2023-09-04 DIAGNOSIS — R918 Other nonspecific abnormal finding of lung field: Secondary | ICD-10-CM | POA: Diagnosis not present

## 2023-09-04 DIAGNOSIS — R042 Hemoptysis: Secondary | ICD-10-CM | POA: Diagnosis not present

## 2023-09-04 DIAGNOSIS — R0989 Other specified symptoms and signs involving the circulatory and respiratory systems: Secondary | ICD-10-CM | POA: Diagnosis not present

## 2023-09-04 DIAGNOSIS — G309 Alzheimer's disease, unspecified: Secondary | ICD-10-CM | POA: Diagnosis present

## 2023-09-04 DIAGNOSIS — Z79899 Other long term (current) drug therapy: Secondary | ICD-10-CM

## 2023-09-04 DIAGNOSIS — Z1152 Encounter for screening for COVID-19: Secondary | ICD-10-CM

## 2023-09-04 DIAGNOSIS — N3000 Acute cystitis without hematuria: Secondary | ICD-10-CM

## 2023-09-04 DIAGNOSIS — R0602 Shortness of breath: Secondary | ICD-10-CM | POA: Diagnosis not present

## 2023-09-04 DIAGNOSIS — N281 Cyst of kidney, acquired: Secondary | ICD-10-CM | POA: Diagnosis not present

## 2023-09-04 DIAGNOSIS — K2100 Gastro-esophageal reflux disease with esophagitis, without bleeding: Secondary | ICD-10-CM | POA: Diagnosis present

## 2023-09-04 DIAGNOSIS — Z8619 Personal history of other infectious and parasitic diseases: Secondary | ICD-10-CM

## 2023-09-04 DIAGNOSIS — Y95 Nosocomial condition: Secondary | ICD-10-CM | POA: Diagnosis present

## 2023-09-04 DIAGNOSIS — Z7901 Long term (current) use of anticoagulants: Secondary | ICD-10-CM

## 2023-09-04 DIAGNOSIS — F0280 Dementia in other diseases classified elsewhere without behavioral disturbance: Secondary | ICD-10-CM | POA: Diagnosis present

## 2023-09-04 DIAGNOSIS — Z8673 Personal history of transient ischemic attack (TIA), and cerebral infarction without residual deficits: Secondary | ICD-10-CM

## 2023-09-04 DIAGNOSIS — A419 Sepsis, unspecified organism: Secondary | ICD-10-CM

## 2023-09-04 DIAGNOSIS — N1831 Chronic kidney disease, stage 3a: Secondary | ICD-10-CM | POA: Diagnosis present

## 2023-09-04 DIAGNOSIS — E872 Acidosis, unspecified: Secondary | ICD-10-CM | POA: Diagnosis not present

## 2023-09-04 DIAGNOSIS — N3090 Cystitis, unspecified without hematuria: Secondary | ICD-10-CM | POA: Diagnosis not present

## 2023-09-04 DIAGNOSIS — K209 Esophagitis, unspecified without bleeding: Secondary | ICD-10-CM

## 2023-09-04 DIAGNOSIS — A4151 Sepsis due to Escherichia coli [E. coli]: Secondary | ICD-10-CM | POA: Diagnosis not present

## 2023-09-04 DIAGNOSIS — I4819 Other persistent atrial fibrillation: Secondary | ICD-10-CM | POA: Diagnosis present

## 2023-09-04 DIAGNOSIS — I480 Paroxysmal atrial fibrillation: Secondary | ICD-10-CM | POA: Diagnosis not present

## 2023-09-04 DIAGNOSIS — G40909 Epilepsy, unspecified, not intractable, without status epilepticus: Secondary | ICD-10-CM

## 2023-09-04 DIAGNOSIS — I129 Hypertensive chronic kidney disease with stage 1 through stage 4 chronic kidney disease, or unspecified chronic kidney disease: Secondary | ICD-10-CM | POA: Diagnosis present

## 2023-09-04 DIAGNOSIS — E785 Hyperlipidemia, unspecified: Secondary | ICD-10-CM | POA: Diagnosis present

## 2023-09-04 DIAGNOSIS — S0990XA Unspecified injury of head, initial encounter: Secondary | ICD-10-CM | POA: Diagnosis not present

## 2023-09-04 DIAGNOSIS — K8689 Other specified diseases of pancreas: Secondary | ICD-10-CM | POA: Diagnosis not present

## 2023-09-04 DIAGNOSIS — Z66 Do not resuscitate: Secondary | ICD-10-CM | POA: Diagnosis present

## 2023-09-04 DIAGNOSIS — I69354 Hemiplegia and hemiparesis following cerebral infarction affecting left non-dominant side: Secondary | ICD-10-CM | POA: Diagnosis not present

## 2023-09-04 DIAGNOSIS — F0390 Unspecified dementia without behavioral disturbance: Secondary | ICD-10-CM | POA: Diagnosis present

## 2023-09-04 DIAGNOSIS — I6522 Occlusion and stenosis of left carotid artery: Secondary | ICD-10-CM | POA: Diagnosis present

## 2023-09-04 DIAGNOSIS — K8020 Calculus of gallbladder without cholecystitis without obstruction: Secondary | ICD-10-CM | POA: Diagnosis not present

## 2023-09-04 DIAGNOSIS — I10 Essential (primary) hypertension: Secondary | ICD-10-CM | POA: Diagnosis present

## 2023-09-04 LAB — URINALYSIS, ROUTINE W REFLEX MICROSCOPIC
Bacteria, UA: NONE SEEN
Bilirubin Urine: NEGATIVE
Glucose, UA: NEGATIVE mg/dL
Ketones, ur: NEGATIVE mg/dL
Nitrite: NEGATIVE
Protein, ur: NEGATIVE mg/dL
Specific Gravity, Urine: 1.036 — ABNORMAL HIGH (ref 1.005–1.030)
WBC, UA: >50 WBC/hpf (ref 0–5)
pH: 5 (ref 5.0–8.0)

## 2023-09-04 LAB — CBC
HCT: 30.7 % — ABNORMAL LOW (ref 39.0–52.0)
Hemoglobin: 9.8 g/dL — ABNORMAL LOW (ref 13.0–17.0)
MCH: 28.9 pg (ref 26.0–34.0)
MCHC: 31.9 g/dL (ref 30.0–36.0)
MCV: 90.6 fL (ref 80.0–100.0)
Platelets: 276 10*3/uL (ref 150–400)
RBC: 3.39 MIL/uL — ABNORMAL LOW (ref 4.22–5.81)
RDW: 15.7 % — ABNORMAL HIGH (ref 11.5–15.5)
WBC: 15.9 10*3/uL — ABNORMAL HIGH (ref 4.0–10.5)
nRBC: 0 % (ref 0.0–0.2)

## 2023-09-04 LAB — LACTIC ACID, PLASMA
Lactic Acid, Venous: 2.3 mmol/L (ref 0.5–1.9)
Lactic Acid, Venous: 2.2 mmol/L (ref 0.5–1.9)

## 2023-09-04 LAB — BASIC METABOLIC PANEL
Anion gap: 10 (ref 5–15)
BUN: 33 mg/dL — ABNORMAL HIGH (ref 8–23)
CO2: 19 mmol/L — ABNORMAL LOW (ref 22–32)
Calcium: 9.6 mg/dL (ref 8.9–10.3)
Chloride: 107 mmol/L (ref 98–111)
Creatinine, Ser: 1.18 mg/dL (ref 0.61–1.24)
GFR, Estimated: >60 mL/min (ref >60)
Glucose, Bld: 113 mg/dL — ABNORMAL HIGH (ref 70–99)
Potassium: 3.8 mmol/L (ref 3.5–5.1)
Sodium: 136 mmol/L (ref 135–145)

## 2023-09-04 LAB — HEPATIC FUNCTION PANEL
ALT: 12 U/L (ref 0–44)
AST: 18 U/L (ref 15–41)
Albumin: 3.8 g/dL (ref 3.5–5.0)
Alkaline Phosphatase: 83 U/L (ref 38–126)
Bilirubin, Direct: 0.2 mg/dL (ref 0.0–0.2)
Indirect Bilirubin: 0.6 mg/dL (ref 0.3–0.9)
Total Bilirubin: 0.8 mg/dL (ref 0.3–1.2)
Total Protein: 7.7 g/dL (ref 6.5–8.1)

## 2023-09-04 LAB — RESP PANEL BY RT-PCR (FLU A&B, COVID) ARPGX2
Influenza A by PCR: NEGATIVE
Influenza B by PCR: NEGATIVE
SARS Coronavirus 2 by RT PCR: NEGATIVE

## 2023-09-04 LAB — LIPASE, BLOOD: Lipase: 27 U/L (ref 11–51)

## 2023-09-04 LAB — TROPONIN I (HIGH SENSITIVITY): Troponin I (High Sensitivity): 9 ng/L (ref <18)

## 2023-09-04 LAB — AMMONIA: Ammonia: 19 umol/L (ref 9–35)

## 2023-09-04 LAB — PROTIME-INR
INR: 1.3 — ABNORMAL HIGH (ref 0.8–1.2)
Prothrombin Time: 16.4 s — ABNORMAL HIGH (ref 11.4–15.2)

## 2023-09-04 MED ORDER — VITAMIN C 500 MG PO TABS
500.0000 mg | ORAL_TABLET | Freq: Every day | ORAL | Status: DC
  Administered 2023-09-06 – 2023-09-10 (×4): 500 mg via ORAL
  Filled 2023-09-04 (×4): qty 1

## 2023-09-04 MED ORDER — ONDANSETRON HCL 4 MG PO TABS: 4.0000 mg | ORAL_TABLET | Freq: Four times a day (QID) | ORAL | Status: DC | PRN

## 2023-09-04 MED ORDER — ATORVASTATIN CALCIUM 20 MG PO TABS
20.0000 mg | ORAL_TABLET | Freq: Every evening | ORAL | Status: DC
  Administered 2023-09-05 – 2023-09-09 (×4): 20 mg via ORAL
  Filled 2023-09-04 (×4): qty 1

## 2023-09-04 MED ORDER — METHYLPREDNISOLONE SODIUM SUCC 125 MG IJ SOLR
125.0000 mg | INTRAMUSCULAR | Status: AC
  Administered 2023-09-04: 125 mg via INTRAVENOUS
  Filled 2023-09-04: qty 2

## 2023-09-04 MED ORDER — ACETAMINOPHEN 650 MG RE SUPP: 650.0000 mg | Freq: Four times a day (QID) | RECTAL | Status: DC | PRN

## 2023-09-04 MED ORDER — ALBUTEROL SULFATE (2.5 MG/3ML) 0.083% IN NEBU
2.5000 mg | INHALATION_SOLUTION | Freq: Four times a day (QID) | RESPIRATORY_TRACT | Status: DC
  Administered 2023-09-05 (×2): 2.5 mg via RESPIRATORY_TRACT
  Filled 2023-09-04 (×2): qty 3

## 2023-09-04 MED ORDER — SODIUM CHLORIDE 0.9 % IV SOLN
2.0000 g | Freq: Two times a day (BID) | INTRAVENOUS | Status: DC
  Administered 2023-09-04: 2 g via INTRAVENOUS
  Filled 2023-09-04 (×2): qty 12.5

## 2023-09-04 MED ORDER — IPRATROPIUM-ALBUTEROL 0.5-2.5 (3) MG/3ML IN SOLN
3.0000 mL | Freq: Once | RESPIRATORY_TRACT | Status: AC
  Administered 2023-09-04: 3 mL via RESPIRATORY_TRACT
  Filled 2023-09-04: qty 3

## 2023-09-04 MED ORDER — ACETAMINOPHEN 325 MG PO TABS: 650.0000 mg | ORAL_TABLET | Freq: Four times a day (QID) | ORAL | Status: DC | PRN

## 2023-09-04 MED ORDER — TRAZODONE HCL 100 MG PO TABS
100.0000 mg | ORAL_TABLET | Freq: Every day | ORAL | Status: DC
  Administered 2023-09-04 – 2023-09-06 (×3): 100 mg via ORAL
  Filled 2023-09-04 (×3): qty 1

## 2023-09-04 MED ORDER — HYDROCODONE-ACETAMINOPHEN 5-325 MG PO TABS: 1.0000 | ORAL_TABLET | ORAL | Status: DC | PRN

## 2023-09-04 MED ORDER — TAMSULOSIN HCL 0.4 MG PO CAPS
0.4000 mg | ORAL_CAPSULE | Freq: Every day | ORAL | Status: DC
  Administered 2023-09-06 – 2023-09-10 (×4): 0.4 mg via ORAL
  Filled 2023-09-04 (×4): qty 1

## 2023-09-04 MED ORDER — LEVETIRACETAM 500 MG PO TABS
500.0000 mg | ORAL_TABLET | Freq: Two times a day (BID) | ORAL | Status: DC
  Administered 2023-09-05 – 2023-09-10 (×10): 500 mg via ORAL
  Filled 2023-09-04 (×10): qty 1

## 2023-09-04 MED ORDER — AMLODIPINE BESYLATE 5 MG PO TABS
10.0000 mg | ORAL_TABLET | Freq: Every day | ORAL | Status: DC
  Administered 2023-09-06 – 2023-09-10 (×4): 10 mg via ORAL
  Filled 2023-09-04 (×4): qty 2

## 2023-09-04 MED ORDER — PANTOPRAZOLE SODIUM 40 MG IV SOLR
40.0000 mg | INTRAVENOUS | Status: DC
  Administered 2023-09-04 – 2023-09-09 (×6): 40 mg via INTRAVENOUS
  Filled 2023-09-04 (×6): qty 10

## 2023-09-04 MED ORDER — ONDANSETRON 4 MG PO TBDP: 4.0000 mg | ORAL_TABLET | Freq: Three times a day (TID) | ORAL | Status: DC | PRN

## 2023-09-04 MED ORDER — IOHEXOL 350 MG/ML SOLN
75.0000 mL | Freq: Once | INTRAVENOUS | Status: AC | PRN
  Administered 2023-09-04: 75 mL via INTRAVENOUS

## 2023-09-04 MED ORDER — VANCOMYCIN HCL 1500 MG/300ML IV SOLN
1500.0000 mg | Freq: Once | INTRAVENOUS | Status: AC
  Administered 2023-09-05: 1500 mg via INTRAVENOUS
  Filled 2023-09-04: qty 300

## 2023-09-04 MED ORDER — SODIUM CHLORIDE 0.9 % IV SOLN
500.0000 mg | Freq: Once | INTRAVENOUS | Status: AC
  Administered 2023-09-04: 500 mg via INTRAVENOUS
  Filled 2023-09-04: qty 5

## 2023-09-04 MED ORDER — LACTATED RINGERS IV SOLN
150.0000 mL/h | INTRAVENOUS | Status: AC
  Administered 2023-09-05: 150 mL/h via INTRAVENOUS

## 2023-09-04 MED ORDER — SODIUM CHLORIDE 0.9 % IV SOLN
2.0000 g | Freq: Once | INTRAVENOUS | Status: AC
  Administered 2023-09-04: 2 g via INTRAVENOUS
  Filled 2023-09-04: qty 20

## 2023-09-04 MED ORDER — ALBUTEROL SULFATE (2.5 MG/3ML) 0.083% IN NEBU: 2.5000 mg | INHALATION_SOLUTION | Freq: Four times a day (QID) | RESPIRATORY_TRACT | Status: DC | PRN

## 2023-09-04 MED ORDER — SENNOSIDES-DOCUSATE SODIUM 8.6-50 MG PO TABS
1.0000 | ORAL_TABLET | Freq: Every day | ORAL | Status: DC
  Administered 2023-09-04 – 2023-09-09 (×5): 1 via ORAL
  Filled 2023-09-04 (×5): qty 1

## 2023-09-04 MED ORDER — APIXABAN 5 MG PO TABS
5.0000 mg | ORAL_TABLET | Freq: Two times a day (BID) | ORAL | Status: DC
  Administered 2023-09-05 – 2023-09-10 (×10): 5 mg via ORAL
  Filled 2023-09-04 (×10): qty 1

## 2023-09-04 MED ORDER — BISACODYL 5 MG PO TBEC: 5.0000 mg | DELAYED_RELEASE_TABLET | Freq: Every day | ORAL | Status: DC | PRN

## 2023-09-04 MED ORDER — ONDANSETRON HCL 4 MG/2ML IJ SOLN: 4.0000 mg | Freq: Four times a day (QID) | INTRAMUSCULAR | Status: DC | PRN

## 2023-09-04 NOTE — ED Notes (Signed)
Pt pulled out IV during straight cath.

## 2023-09-04 NOTE — Assessment & Plan Note (Signed)
Continue trazodone at bedtime Delirium precautions

## 2023-09-04 NOTE — ED Provider Notes (Signed)
.  Critical Care  Performed by: Sharman Cheek, MD Authorized by: Sharman Cheek, MD   Critical care provider statement:    Critical care time (minutes):  35   Critical care time was exclusive of:  Separately billable procedures and treating other patients   Critical care was necessary to treat or prevent imminent or life-threatening deterioration of the following conditions:  Sepsis   Critical care was time spent personally by me on the following activities:  Development of treatment plan with patient or surrogate, discussions with consultants, evaluation of patient's response to treatment, examination of patient, obtaining history from patient or surrogate, ordering and performing treatments and interventions, ordering and review of laboratory studies, ordering and review of radiographic studies, pulse oximetry, re-evaluation of patient's condition and review of old charts   Care discussed with: admitting provider     Clinical Course as of 09/04/23 1829  Thu Sep 04, 2023  1405 WBC(!): 15.9 [MQ]  1405 Hemoglobin(!): 9.8 Leukocytosis [MQ]  1405 Troponin I (High Sensitivity): 9 Normal [MQ]  1405 Afebrile.  Mild tachycardia heart rate 105.  Patient noted 90% O2 saturation on room air [MQ]    Clinical Course User Index [MQ] Sharyn Creamer, MD    ----------------------------------------- 6:29 PM on 09/04/2023 -----------------------------------------  Awaiting CT reports for admission.    ----------------------------------------- 9:39 PM on 09/04/2023 ----------------------------------------- CT shows evidence of cystitis.  Also possibly pneumonia which is congruent with the patient's presentation with shortness of breath, cough, tachycardia and borderline hypoxia.  Rocephin and azithromycin ordered for pneumonia and UTI coverage.  Case discussed with the hospitalist.  Repeat lactate is slightly downtrending, no signs of shock.  Final diagnoses:  Cystitis  Community acquired  pneumonia, unspecified laterality  Sepsis, due to unspecified organism, unspecified whether acute organ dysfunction present [facility])      Sharman Cheek, MD 09/04/23 2140

## 2023-09-04 NOTE — Assessment & Plan Note (Signed)
Holding antihypertensives due to soft blood pressure in the setting of sepsis

## 2023-09-04 NOTE — ED Notes (Signed)
Pt given a drink. Call light within reach.

## 2023-09-04 NOTE — Assessment & Plan Note (Signed)
Continue Eliquis Not currently on rate control agents per review of current med list

## 2023-09-04 NOTE — H&P (Addendum)
History and Physical    Patient: Richard Carter ACZ:[id number] DOB: [birth year] DOA: 09/04/2023 DOS: the patient was seen and examined on 09/04/2023 PCP: Marisue Ivan, MD  Patient coming from: SNF  Chief Complaint:  Chief Complaint  Patient presents with   Shortness of Breath    HPI: Richard Carter is a 82 y.o. male with medical history significant for CVA with residual dysphagia on a pured and honey thick diet, carotid artery stenosis s/p left CEA, seizure disorder, dementia, A-fib on Eliquis, HTN, HLD,  COPD, tobacco use disorder,  with last hospitalization 8/16 to 07/18/2023 with acute encephalopathy attributed to ESBL E. coli UTI treated with ertapenem as well as possible seizure, who presents for evaluation of shortness of blood with possible hemoptysis and a strong odor to his urine.  History is given by POA Jackalyn Lombard over the phone who stated that patient was in his usual state of health until today when he noted that patient was coughing much more than his usual and was bringing up blood-streaked phlegm.  He also appeared very short of breath he denied chest pain, abdominal pain and had no vomiting, abdominal pain or diarrhea.  Also had no fever or chills.  He also noted that since the day prior patient has had a very strong odor to his urine and he is concerned about a UTI.  At baseline patient maintains independence in ADLs.  His POA is his full-time caregiver and he has home health coming out 3 times a week. ED course and data review: Mildly tachycardic at 107 with BP 103/56.  Afebrile.  Respiratory rate in the intermittently up to 24, O2 sat maintained in the mid 90s. Labs: Notable for WBC 16,000 With lactic acid 2.3-2.2.  Respiratory viral panel negative for COVID flu.  VBG unremarkable. Hemoglobin at baseline at 9.8 BMP unremarkable except for bicarb of 19 Lipase and LFTs normal as well as ammonia Urinalysis pending EKG, personally viewed and interpreted sinus tach.   Specific ST-T wave changes. CT head nonacute and showed chronic ischemic injury  CTA chest negative for PE but showing groundglass opacities as follows: IMPRESSION: 1. No pulmonary embolus. 2. Mild bronchial thickening. Ground-glass opacities in the dependent right lower lobe may be hypoventilatory, non but nonspecific. Infection is not entirely excluded. 3. Moderate-sized hiatal hernia. Wall thickening of the mid and distal esophagus, can be seen with reflux or esophagitis.  CT abdomen and pelvis showed some circumferential bladder wall thickening possible cystitis with recommendation to correlate with UA: IMPRESSION: 1. No bowel obstruction. 2. Circumferential bladder wall thickening may represent cystitis. Recommend correlation with urinalysis. 3. Additional stable chronic findings as described.   Patient was treated with DuoNebs started on Rocephin and azithromycin Hospitalist consulted for admission.     Past Medical History:  Diagnosis Date   Alcohol abuse    Dementia (HCC)    GERD (gastroesophageal reflux disease)    HLD (hyperlipidemia)    HTN (hypertension)    Seizures (HCC)    Stroke [facility])    Past Surgical History:  Procedure Laterality Date   ENDARTERECTOMY Left 04/02/2023   Procedure: ENDARTERECTOMY CAROTID;  Surgeon: Renford Dills, MD;  Location: [facility] ORS;  Service: Vascular;  Laterality: Left;   skin cyst removal     Social History:  reports that he quit smoking about 21 months ago. His smoking use included cigarettes. He started smoking about 69 years ago. He has a 34 pack-year smoking history. He has never used smokeless tobacco. He  reports that he does not currently use alcohol. He reports that he does not use drugs.  No Known Allergies  Family History  Problem Relation Age of Onset   Stroke Mother    Cancer Brother     Prior to Admission medications   Medication Sig Start Date End Date Taking? Authorizing Provider  acetaminophen (TYLENOL) 500  MG tablet Take 1,000 mg by mouth 2 (two) times daily as needed for moderate pain, fever or headache.    [provider]  albuterol (PROVENTIL) (2.5 MG/3ML) 0.083% nebulizer solution Take 3 mLs (2.5 mg total) by nebulization every 6 (six) hours as needed for wheezing or shortness of breath. 07/18/23   Pokhrel, Laxman, MD  amLODipine (NORVASC) 10 MG tablet Take 1 tablet (10 mg total) by mouth daily. 04/07/23   Gillis Santa, MD  apixaban (ELIQUIS) 5 MG TABS tablet Take 1 tablet (5 mg total) by mouth 2 (two) times daily. 05/29/22   Lurene Shadow, MD  ascorbic acid (VITAMIN C) 500 MG tablet Take 500 mg by mouth daily.    [provider]  atorvastatin (LIPITOR) 20 MG tablet Take 20 mg by mouth every evening.    [provider]  bisacodyl (DULCOLAX) 5 MG EC tablet Take 1 tablet (5 mg total) by mouth daily as needed for severe constipation. 07/18/23   Pokhrel, Rebekah Chesterfield, MD  levETIRAcetam (KEPPRA) 500 MG tablet Take 1 tablet (500 mg total) by mouth 2 (two) times daily. 07/18/23   Pokhrel, Rebekah Chesterfield, MD  ondansetron (ZOFRAN-ODT) 4 MG disintegrating tablet Take 4 mg by mouth every 8 (eight) hours as needed for nausea or vomiting.    [provider]  pantoprazole (PROTONIX) 40 MG tablet Take 1 tablet (40 mg total) by mouth daily. 06/11/23 08/10/23  Lurene Shadow, MD  senna-docusate (SENOKOT-S) 8.6-50 MG tablet Take 1 tablet by mouth at bedtime. 07/18/23   Pokhrel, Rebekah Chesterfield, MD  tamsulosin (FLOMAX) 0.4 MG CAPS capsule Take 1 capsule (0.4 mg total) by mouth daily after breakfast. 06/11/23   Lurene Shadow, MD  traZODone (DESYREL) 100 MG tablet Take 1 tablet (100 mg total) by mouth at bedtime. 05/11/21   Tresa Moore, MD  vitamin B-12 (CYANOCOBALAMIN) 500 MCG tablet Take 500 mcg by mouth daily.    [provider]    Physical Exam: Vitals:   09/04/23 1118 09/04/23 1526 09/04/23 1530 09/04/23 1800  BP:  (!) 132/121 127/75 127/72  Pulse:  93 91 88  Resp:  13 (!) 24 12  Temp:       TempSrc:      SpO2:  97% 93% 100%  Weight: 61.2 kg     Height: 5\' 2"  (1.575 m)      Physical Exam Vitals and nursing note reviewed.  Constitutional:      General: He is sleeping. He is not in acute distress.    Appearance: He is ill-appearing.     Comments: Frail-appearing elderly male  HENT:     Head: Normocephalic and atraumatic.  Cardiovascular:     Rate and Rhythm: Normal rate and regular rhythm.     Heart sounds: Normal heart sounds.  Pulmonary:     Effort: Tachypnea present.     Breath sounds: Normal breath sounds.  Abdominal:     Palpations: Abdomen is soft.     Tenderness: There is no abdominal tenderness.  Neurological:     Mental Status: He is easily aroused. Mental status is at baseline. He is lethargic.     Labs on  Admission: I have personally reviewed following labs and imaging studies  CBC: Recent Labs  Lab 09/04/23 1124  WBC 15.9*  HGB 9.8*  HCT 30.7*  MCV 90.6  PLT 276   Basic Metabolic Panel: Recent Labs  Lab 09/04/23 1124  NA 136  K 3.8  CL 107  CO2 19*  GLUCOSE 113*  BUN 33*  CREATININE 1.18  CALCIUM 9.6   GFR: Estimated Creatinine Clearance: 36 mL/min (by C-G formula based on SCr of 1.18 mg/dL). Liver Function Tests: Recent Labs  Lab 09/04/23 1502  AST 18  ALT 12  ALKPHOS 83  BILITOT 0.8  PROT 7.7  ALBUMIN 3.8   Recent Labs  Lab 09/04/23 1502  LIPASE 27   Recent Labs  Lab 09/04/23 1502  AMMONIA 19   Coagulation Profile: Recent Labs  Lab 09/04/23 1516  INR 1.3*   Cardiac Enzymes: No results for input(s): "CKTOTAL", "CKMB", "CKMBINDEX", "TROPONINI" in the last 168 hours. BNP (last 3 results) No results for input(s): "PROBNP" in the last 8760 hours. HbA1C: No results for input(s): "HGBA1C" in the last 72 hours. CBG: No results for input(s): "GLUCAP" in the last 168 hours. Lipid Profile: No results for input(s): "CHOL", "HDL", "LDLCALC", "TRIG", "CHOLHDL", "LDLDIRECT" in the last 72 hours. Thyroid Function  Tests: No results for input(s): "TSH", "T4TOTAL", "FREET4", "T3FREE", "THYROIDAB" in the last 72 hours. Anemia Panel: No results for input(s): "VITAMINB12", "FOLATE", "FERRITIN", "TIBC", "IRON", "RETICCTPCT" in the last 72 hours. Urine analysis:    Component Value Date/Time   COLORURINE YELLOW 07/12/2023 0825   APPEARANCEUR CLOUDY (A) 07/12/2023 0825   APPEARANCEUR Clear 06/12/2014 1029   LABSPEC 1.019 07/12/2023 0825   LABSPEC 1.005 06/12/2014 1029   PHURINE 5.0 07/12/2023 0825   GLUCOSEU NEGATIVE 07/12/2023 0825   GLUCOSEU Negative 06/12/2014 1029   HGBUR LARGE (A) 07/12/2023 0825   BILIRUBINUR NEGATIVE 07/12/2023 0825   BILIRUBINUR Negative 06/12/2014 1029   KETONESUR NEGATIVE 07/12/2023 0825   PROTEINUR 30 (A) 07/12/2023 0825   NITRITE POSITIVE (A) 07/12/2023 0825   LEUKOCYTESUR LARGE (A) 07/12/2023 0825   LEUKOCYTESUR Negative 06/12/2014 1029    Radiological Exams on Admission: CT ABDOMEN PELVIS W CONTRAST  Result Date: 09/04/2023 CLINICAL DATA:  Bowel obstruction suspected Hemoptysis. EXAM: CT ABDOMEN AND PELVIS WITH CONTRAST TECHNIQUE: Multidetector CT imaging of the abdomen and pelvis was performed using the standard protocol following bolus administration of intravenous contrast. RADIATION DOSE REDUCTION: This exam was performed according to the departmental dose-optimization program which includes automated exposure control, adjustment of the mA and/or kV according to patient size and/or use of iterative reconstruction technique. CONTRAST:  75mL OMNIPAQUE IOHEXOL 350 MG/ML SOLN COMPARISON:  CT 06/30/2023 FINDINGS: Lower chest: Assessed on concurrent chest CT, reported separately. Hepatobiliary: No focal liver abnormality. Again seen gallstone. No abnormal gallbladder distension. Assessment for wall thickening is limited due to motion. No biliary dilatation. Pancreas: Few calcifications in the pancreatic head. No ductal dilatation or inflammation. Spleen: Normal in size without  focal abnormality. Adrenals/Urinary Tract: No adrenal nodule. No hydronephrosis or perinephric edema. Homogeneous renal enhancement with symmetric excretion on delayed phase imaging. Unchanged bilateral renal cysts and low-density lesions too small to characterize. No further follow-up imaging is recommended. Circumferential bladder wall thickening. Stomach/Bowel: Hiatal hernia is at least moderate in size. No small bowel dilatation. No bowel obstruction. Cecum is low-lying in the mid pelvis. Normal appendix. Small to moderate colonic stool burden. Occasional colonic diverticula. High density associated with the distal sigmoid colon may represent retained barium within  diverticula. Vascular/Lymphatic: Dense aortic and branch atherosclerosis. No aortic aneurysm. Portal vein is patent. No acute vascular findings. No adenopathy. Reproductive: Prominent prostate. Stable asymmetric calcification in the left seminal vesicle. Other: No ascites or free air. Musculoskeletal: Chronic L1 compression deformity, stable. Stable L3-L4 and L4-L5 degenerative disc disease. No free air, free fluid, or intra-abdominal fluid collection. IMPRESSION: 1. No bowel obstruction. 2. Circumferential bladder wall thickening may represent cystitis. Recommend correlation with urinalysis. 3. Additional stable chronic findings as described. Aortic Atherosclerosis (ICD10-I70.0). Electronically Signed   By: Narda Rutherford M.D.   On: 09/04/2023 19:17   CT Angio Chest PE W and/or Wo Contrast  Result Date: 09/04/2023 CLINICAL DATA:  Pulmonary embolism (PE) suspected, high prob 82 year old with shortness of breath. EXAM: CT ANGIOGRAPHY CHEST WITH CONTRAST TECHNIQUE: Multidetector CT imaging of the chest was performed using the standard protocol during bolus administration of intravenous contrast. Multiplanar CT image reconstructions and MIPs were obtained to evaluate the vascular anatomy. RADIATION DOSE REDUCTION: This exam was performed according  to the departmental dose-optimization program which includes automated exposure control, adjustment of the mA and/or kV according to patient size and/or use of iterative reconstruction technique. CONTRAST:  75mL OMNIPAQUE IOHEXOL 350 MG/ML SOLN COMPARISON:  Radiograph earlier today. FINDINGS: Cardiovascular: There are no filling defects within the pulmonary arteries to suggest pulmonary embolus. Aortic atherosclerosis without acute aortic findings or aneurysm. Heart size is normal. There are coronary artery calcifications. No significant pericardial effusion. Mediastinum/Nodes: Moderate-sized hiatal hernia. Wall thickening of the mid and distal esophagus. Small right hilar and mediastinal lymph nodes are not enlarged by size criteria, typically reactive. Lungs/Pleura: Mild bronchial thickening. Ground-glass opacities in the dependent right lower lobe may be hypoventilatory but are nonspecific. No confluent airspace disease. No pleural fluid. No suspicious pulmonary nodule or mass. Calcified granuloma at the left lung base. Upper Abdomen: Assessed on concurrent abdominopelvic CT, reported separately. Musculoskeletal: There are no acute or suspicious osseous abnormalities. There are remote bilateral rib fractures. Right gynecomastia. Review of the MIP images confirms the above findings. IMPRESSION: 1. No pulmonary embolus. 2. Mild bronchial thickening. Ground-glass opacities in the dependent right lower lobe may be hypoventilatory, non but nonspecific. Infection is not entirely excluded. 3. Moderate-sized hiatal hernia. Wall thickening of the mid and distal esophagus, can be seen with reflux or esophagitis. Aortic Atherosclerosis (ICD10-I70.0). Electronically Signed   By: Narda Rutherford M.D.   On: 09/04/2023 19:12   CT Head Wo Contrast  Result Date: 09/04/2023 CLINICAL DATA:  Head trauma, repeat vomiting (Age 7-64y) EXAM: CT HEAD WITHOUT CONTRAST TECHNIQUE: Contiguous axial images were obtained from the base  of the skull through the vertex without intravenous contrast. RADIATION DOSE REDUCTION: This exam was performed according to the departmental dose-optimization program which includes automated exposure control, adjustment of the mA and/or kV according to patient size and/or use of iterative reconstruction technique. COMPARISON:  MRI head 07/12/2023.  CT head 06/30/2023. FINDINGS: Brain: Remote left frontal infarct. Additional patchy white matter hypodensities, compatible with chronic microvascular ischemic change. No evidence of acute hemorrhage, mass lesion, midline shift or hydrocephalus. Vascular: Calcific atherosclerosis. Skull: No acute fracture. Sinuses/Orbits: Clear sinuses.  No acute orbital findings. Other: No mastoid effusions. IMPRESSION: 1. No evidence of acute intracranial abnormality. 2. Chronic ischemic injury and cerebral atrophy (ICD10-G31.9). Electronically Signed   By: Feliberto Harts M.D.   On: 09/04/2023 19:10   DG Chest 2 View  Result Date: 09/04/2023 CLINICAL DATA:  Shortness of breath.  Hemoptysis. EXAM: CHEST - 2 VIEW  COMPARISON:  07/11/2023. FINDINGS: Low lung volume. Probable atelectatic changes at the lung bases. Bilateral lung fields are clear. No acute consolidation, lung mass or lung collapse. Bilateral costophrenic angles are clear. Normal cardio-mediastinal silhouette. No acute osseous abnormalities. Multiple old healed bilateral rib fractures noted. The soft tissues are within normal limits. IMPRESSION: 1. Low lung volume. No active cardiopulmonary disease. Electronically Signed   By: Jules Schick M.D.   On: 09/04/2023 13:57     Data Reviewed: Relevant notes from primary care and specialist visits, past discharge summaries as available in EHR, including Care Everywhere. Prior diagnostic testing as pertinent to current admission diagnoses Updated medications and problem lists for reconciliation ED course, including vitals, labs, imaging, treatment and response to  treatment Triage notes, nursing and pharmacy notes and ED provider's notes Notable results as noted in HPI   Assessment and Plan: * Sepsis (HCC) Possible SIRS, suspect sepsis Pneumonia versus UTI Patient with cough, foul-smelling urine, leukocytosis, lactic acidosis, soft BP and tachycardia Patient has history of both aspiration pneumonia as well as ESBL E. coli UTI Possible pneumonia versus UTI Sepsis fluids Treat each etiology as described on the respective problem  Acute cystitis History of ESBL E. coli UTI 06/2023 POA reports 2 days of foul-smelling urine CT abdomen and pelvis showing circumferential bladder wall thickening which may represent cystitis Follow-up urinalysis Continue above antibiotics as it will cover UTI, but may need ertapenem given history of ESBL Follow cultures   HCAP (healthcare-associated pneumonia) Dysphagia as late effect of CVA with prior history of aspiration pneumonia Patient was noted to be coughing excessively on the day of arrival with blood-tinged sputum At baseline is on soft pured diet with honey thick liquids with which she is compliant per POA Will keep n.p.o. Aspiration precautions Rocephin, azithromycin and vancomycin Speech therapy evaluation  Esophagitis Patient with coughing and on dysphagia diet CT abdomen and pelvis shows wall thickening of the mid and distal esophagus which can be seen with reflux or esophagitis Protonix IV No recent EGD noted on record Aspiration precautions  AF (paroxysmal atrial fibrillation) (HCC) Continue Eliquis Not currently on rate control agents per review of current med list  HTN (hypertension) Holding antihypertensives due to soft blood pressure in the setting of sepsis  History of stroke Left carotid artery stenosis s/p endarterectomy Continue apixaban and atorvastatin  Seizure disorder (HCC) Continue Keppra 500 mg twice daily  Dementia (HCC) Continue trazodone at bedtime Delirium  precautions     DVT prophylaxis: Apixaban  Consults: none  Advance Care Planning:   Code Status: Prior   Family Communication: POA, Renaldo Reel  Disposition Plan: Back to previous home environment  Severity of Illness: The appropriate patient status for this patient is INPATIENT. Inpatient status is judged to be reasonable and necessary in order to provide the required intensity of service to ensure the patient's safety. The patient's presenting symptoms, physical exam findings, and initial radiographic and laboratory data in the context of their chronic comorbidities is felt to place them at high risk for further clinical deterioration. Furthermore, it is not anticipated that the patient will be medically stable for discharge from the hospital within 2 midnights of admission.   * I certify that at the point of admission it is my clinical judgment that the patient will require inpatient hospital care spanning beyond 2 midnights from the point of admission due to high intensity of service, high risk for further deterioration and high frequency of surveillance required.* CRITICAL CARE Performed by: Jerrye Beavers  V Perkins Molina   Total critical care time: 60 minutes  Critical care time was exclusive of separately billable procedures and treating other patients.  Critical care was necessary to treat or prevent imminent or life-threatening deterioration.  Critical care was time spent personally by me on the following activities: development of treatment plan with patient and/or surrogate as well as nursing, discussions with consultants, evaluation of patient's response to treatment, examination of patient, obtaining history from patient or surrogate, ordering and performing treatments and interventions, ordering and review of laboratory studies, ordering and review of radiographic studies, pulse oximetry and re-evaluation of patient's condition.  Author: Andris Baumann, MD 09/04/2023 9:04 PM  For on  call review [email or web address].

## 2023-09-04 NOTE — Assessment & Plan Note (Addendum)
Patient with coughing and on dysphagia diet CT abdomen and pelvis shows wall thickening of the mid and distal esophagus which can be seen with reflux or esophagitis Protonix IV No recent EGD noted on record Aspiration precautions

## 2023-09-04 NOTE — IPAL (Signed)
  Interdisciplinary Goals of Care Family Meeting   Date carried out: 09/04/2023  Location of the meeting: Phone conference  Member's involved: Physician and Family Member or next of kin  Durable Power of Attorney or acting medical decision maker: POA, Renaldo Reel    Discussion: We discussed goals of care for [employer] .   I have reviewed medical records including EPIC notes, labs and imaging, assessed the patient and then met with chris barnes to discuss major active diagnoses, plan of care, natural trajectory, prognosis, GOC, EOL wishes, disposition and options including Full code/DNI/DNR and the concept of comfort care if DNR is elected. Questions and concerns were addressed. They are  in agreement to continue current plan of care . Election for DNR status.   Code status:   Code Status: Prior   Disposition: Continue current acute care  Time spent for the meeting: 35    Andris Baumann, MD  09/04/2023, 11:03 PM

## 2023-09-04 NOTE — Assessment & Plan Note (Signed)
History of ESBL E. coli UTI 06/2023 POA reports 2 days of foul-smelling urine CT abdomen and pelvis showing circumferential bladder wall thickening which may represent cystitis Follow-up urinalysis Continue above antibiotics as it will cover UTI, but may need ertapenem given history of ESBL Follow cultures

## 2023-09-04 NOTE — ED Triage Notes (Addendum)
Per the pt POA pt has been SOB and appears to be coughing up blood. Pt sts that he has been SOB since this AM. POA sts that pt recently got out of rehab on the 19/6 and went home. POA advises that pt looks like he has possibly had another stroke. POA sts that pt is having difficulty walking and has been dragging his right foot and using his right arm like he normally does. POA noticed this yesterday. Pt does get therapy at his house three times a week.

## 2023-09-04 NOTE — Assessment & Plan Note (Signed)
Dysphagia as late effect of CVA with prior history of aspiration pneumonia Patient was noted to be coughing excessively on the day of arrival with blood-tinged sputum At baseline is on soft pured diet with honey thick liquids with which she is compliant per POA Will keep n.p.o. Aspiration precautions Rocephin, azithromycin and vancomycin Speech therapy evaluation

## 2023-09-04 NOTE — Sepsis Progress Note (Signed)
Elink monitoring for the code sepsis protocol.  

## 2023-09-04 NOTE — ED Notes (Signed)
Straight cathed pt for urine sample.

## 2023-09-04 NOTE — Assessment & Plan Note (Signed)
-   Continue Keppra 500mg twice daily

## 2023-09-04 NOTE — ED Notes (Signed)
Changed pt brief and complete bed change.

## 2023-09-04 NOTE — ED Provider Notes (Signed)
[facility] Provider Note    Event Date/Time   First MD Initiated Contact with Patient 09/04/23 1402     (approximate)   History   Shortness of Breath   HPI  Richard Carter is a 82 y.o. male  CVA, seizure disorder, mixed Alzheimer's and vascular dementia, persistent atrial fibrillation on Eliquis, essential hypertension, hyperlipidemia, carotid artery stenosis s/p left CEA, remote EtOH abuse, CKD stage IIIa, COPD, tobacco use disorder, medical noncompliance   Discussed with the patient's healthcare power of attorney who is also at the bedside.  He advises that yesterday patient went to rehab, after doing his exercises he seemed like he was coughing up blood looking short of breath last night and throughout today.  Patient normally has confusion.  Patient lives with others, but his healthcare power of attorney tends to his needs on a daily basis  He has been compliant in taking his medication including Eliquis twice daily.  Healthcare power of attorney advised she has not witnessed him to be in any pain or vomiting up any blood but thinks he is coughing up blood from time to time     Physical Exam   Triage Vital Signs: ED Triage Vitals  Encounter Vitals Group     BP 09/04/23 1117 (!) 103/56     Systolic BP Percentile --      Diastolic BP Percentile --      Pulse Rate 09/04/23 1117 (!) 107     Resp 09/04/23 1117 19     Temp 09/04/23 1117 98.1 F (36.7 C)     Temp Source 09/04/23 1117 Oral     SpO2 09/04/23 1117 90 %     Weight 09/04/23 1118 135 lb (61.2 kg)     Height 09/04/23 1118 5\' 2"  (1.575 m)     Head Circumference --      Peak Flow --      Pain Score 09/04/23 1118 0     Pain Loc --      Pain Education --      Exclude from Growth Chart --     Most recent vital signs: Vitals:   09/10/23 0324 09/10/23 0754  BP: 129/61 (!) 166/59  Pulse: (!) 57 (!) 56  Resp: 18 18  Temp: 98 F (36.7 C) 97.9 F (36.6 C)  SpO2: 94% 97%      General: Awake, no distress.  Patient not oriented, thinks he is at a bank of the year to be about 2004.  He does engage in conversation though, and does not appear in any acute distress.  Patient reports no complaints but also does not recall coughing up blood or anything of that nature CV:  Good peripheral perfusion.  Normal tones just slight tachycardia Resp:  Normal effort.  Lung sounds are diminished throughout but no obvious rales.  Work of breathing appears normal.  No active hemoptysis or emesis. Abd:  No distention.  Other:     ED Results / Procedures / Treatments   Labs (all labs ordered are listed, but only abnormal results are displayed) Labs Reviewed  BASIC METABOLIC PANEL - Abnormal; Notable for the following components:      Result Value   CO2 19 (*)    Glucose, Bld 113 (*)    BUN 33 (*)    All other components within normal limits  CBC - Abnormal; Notable for the following components:   WBC 15.9 (*)    RBC 3.39 (*)  Hemoglobin 9.8 (*)    HCT 30.7 (*)    RDW 15.7 (*)    All other components within normal limits  URINALYSIS, ROUTINE W REFLEX MICROSCOPIC - Abnormal; Notable for the following components:   Color, Urine YELLOW (*)    APPearance CLOUDY (*)    Specific Gravity, Urine 1.036 (*)    Hgb urine dipstick MODERATE (*)    Leukocytes,Ua LARGE (*)    Non Squamous Epithelial PRESENT (*)    All other components within normal limits  BLOOD GAS, VENOUS - Abnormal; Notable for the following components:   pCO2, Ven 41 (*)    Acid-base deficit 5.9 (*)    All other components within normal limits  LACTIC ACID, PLASMA - Abnormal; Notable for the following components:   Lactic Acid, Venous 2.3 (*)    All other components within normal limits  LACTIC ACID, PLASMA - Abnormal; Notable for the following components:   Lactic Acid, Venous 2.2 (*)    All other components within normal limits  PROTIME-INR - Abnormal; Notable for the following components:    Prothrombin Time 16.4 (*)    INR 1.3 (*)    All other components within normal limits  COMPREHENSIVE METABOLIC PANEL - Abnormal; Notable for the following components:   CO2 17 (*)    Glucose, Bld 146 (*)    BUN 37 (*)    Albumin 3.4 (*)    GFR, Estimated 58 (*)    All other components within normal limits  CBC - Abnormal; Notable for the following components:   RBC 2.70 (*)    Hemoglobin 7.9 (*)    HCT 24.2 (*)    RDW 15.8 (*)    All other components within normal limits  CBC - Abnormal; Notable for the following components:   WBC 10.9 (*)    RBC 2.49 (*)    Hemoglobin 7.4 (*)    HCT 22.5 (*)    RDW 16.2 (*)    All other components within normal limits  COMPREHENSIVE METABOLIC PANEL - Abnormal; Notable for the following components:   Chloride 113 (*)    CO2 20 (*)    Glucose, Bld 102 (*)    BUN 43 (*)    Total Protein 6.1 (*)    Albumin 3.2 (*)    AST 13 (*)    GFR, Estimated 58 (*)    All other components within normal limits  LACTIC ACID, PLASMA - Abnormal; Notable for the following components:   Lactic Acid, Venous 2.2 (*)    All other components within normal limits  LACTIC ACID, PLASMA - Abnormal; Notable for the following components:   Lactic Acid, Venous 2.3 (*)    All other components within normal limits  CBC - Abnormal; Notable for the following components:   RBC 2.49 (*)    Hemoglobin 7.3 (*)    HCT 23.1 (*)    RDW 16.4 (*)    All other components within normal limits  COMPREHENSIVE METABOLIC PANEL - Abnormal; Notable for the following components:   Chloride 112 (*)    CO2 20 (*)    BUN 38 (*)    Total Protein 6.0 (*)    Albumin 3.0 (*)    AST 12 (*)    All other components within normal limits  CBC - Abnormal; Notable for the following components:   RBC 2.56 (*)    Hemoglobin 7.5 (*)    HCT 23.7 (*)    RDW 16.6 (*)  All other components within normal limits  COMPREHENSIVE METABOLIC PANEL - Abnormal; Notable for the following components:    Chloride 113 (*)    BUN 26 (*)    Total Protein 5.7 (*)    Albumin 2.9 (*)    AST 13 (*)    All other components within normal limits  CBC WITH DIFFERENTIAL/PLATELET - Abnormal; Notable for the following components:   RBC 2.52 (*)    Hemoglobin 7.3 (*)    HCT 23.3 (*)    RDW 16.5 (*)    All other components within normal limits  CBC WITH DIFFERENTIAL/PLATELET - Abnormal; Notable for the following components:   RBC 2.60 (*)    Hemoglobin 7.8 (*)    HCT 24.0 (*)    RDW 16.8 (*)    Abs Immature Granulocytes 0.12 (*)    All other components within normal limits  BASIC METABOLIC PANEL - Abnormal; Notable for the following components:   Chloride 112 (*)    All other components within normal limits  RESP PANEL BY RT-PCR (FLU A&B, COVID) ARPGX2  CULTURE, BLOOD (ROUTINE X 2)  CULTURE, BLOOD (ROUTINE X 2)  MRSA NEXT GEN BY PCR, NASAL  AMMONIA  HEPATIC FUNCTION PANEL  LIPASE, BLOOD  PROCALCITONIN  BASIC METABOLIC PANEL  MAGNESIUM  PHOSPHORUS  GLUCOSE, CAPILLARY  TROPONIN I (HIGH SENSITIVITY)  TROPONIN I (HIGH SENSITIVITY)     EKG  And interpreted by me as sinus tachycardia heart rate 110 QRS 90 QTc 430.   RADIOLOGY  CT head interpreted as negative for acute  CT abdomen pelvis no evidence of acute bowel obstruction.  Possible cystitis.  CT PE IMPRESSION: 1. No pulmonary embolus. 2. Mild bronchial thickening. Ground-glass opacities in the dependent right lower lobe may be hypoventilatory, non but nonspecific. Infection is not entirely excluded. 3. Moderate-sized hiatal hernia. Wall thickening of the mid and distal esophagus, can be seen with reflux or esophagitis.   PROCEDURES:  Critical Care performed: No  Procedures   MEDICATIONS ORDERED IN ED: Medications  lactated ringers infusion ( Intravenous Stopped 09/05/23 0902)  0.9 %  sodium chloride infusion (0 mLs Intravenous Stopped 09/07/23 0301)  0.9 %  sodium chloride infusion (0 mLs Intravenous Stopped  09/07/23 2113)  ipratropium-albuterol (DUONEB) 0.5-2.5 (3) MG/3ML nebulizer solution 3 mL (3 mLs Nebulization Given 09/04/23 1512)  methylPREDNISolone sodium succinate (SOLU-MEDROL) 125 mg/2 mL injection 125 mg (125 mg Intravenous Given 09/04/23 1544)  iohexol (OMNIPAQUE) 350 MG/ML injection 75 mL (75 mLs Intravenous Contrast Given 09/04/23 1653)  cefTRIAXone (ROCEPHIN) 2 g in sodium chloride 0.9 % 100 mL IVPB (0 g Intravenous Stopped 09/04/23 2049)  azithromycin (ZITHROMAX) 500 mg in sodium chloride 0.9 % 250 mL IVPB (0 mg Intravenous Stopped 09/04/23 2154)  vancomycin (VANCOREADY) IVPB 1500 mg/300 mL (0 mg Intravenous Stopped 09/05/23 0932)  ertapenem (INVANZ) 1 g in sodium chloride 0.9 % 100 mL IVPB (0 g Intravenous Stopped 09/10/23 1215)     IMPRESSION / MDM / ASSESSMENT AND PLAN / ED COURSE  I reviewed the triage vital signs and the nursing notes.                              Differential diagnosis includes, but is not limited to, pneumonia, PE, sepsis, urinary tract infection, ACS, pneumothorax, etc.  Differential diagnosis quite broad including possible mass lesion etc.  Concerns for dyspnea and hemoptysis.    Patient's presentation is most consistent with acute complicated illness /  injury requiring diagnostic workup.   The patient is on the cardiac monitor to evaluate for evidence of arrhythmia and/or significant heart rate changes.    Clinical Course as of 09/12/23 0305  Thu Sep 04, 2023  1405 WBC(!): 15.9 [MQ]  1405 Hemoglobin(!): 9.8 Leukocytosis [MQ]  1405 Troponin I (High Sensitivity): 9 Normal [MQ]  1405 Afebrile.  Mild tachycardia heart rate 105.  Patient noted 90% O2 saturation on room air [MQ]    Clinical Course User Index [MQ] Sharyn Creamer, MD   Patient started on azithromycin and Rocephin.  Also some concern for possible UTI for which Rocephin should also provide coverage.  Admitted to hospitalist under the care of Dr. Para March    Sepsis reassessment  completed.  Lactic acid slightly elevated but not greater than 4, no hypotension.  No evidence to suggest severe sepsis or septic shock to noted at this time.  FINAL CLINICAL IMPRESSION(S) / ED DIAGNOSES   Final diagnoses:  Cystitis  Community acquired pneumonia, unspecified laterality    Sepsis without acute organ dysfunction, due to unspecified organism (HCC)       Rx / DC Orders   ED Discharge Orders          Ordered    food thickener (SIMPLYTHICK, HONEY/LEVEL 3/MODERATELY THICK,) LIQD  As needed        09/10/23 1100    Increase activity slowly        09/10/23 1100    Diet - low sodium heart healthy        09/10/23 1100    Ambulatory referral to Infectious Disease        09/10/23 1100    albuterol (PROVENTIL) (2.5 MG/3ML) 0.083% nebulizer solution  Every 6 hours PRN        09/10/23 1100    Nebulizers (COMPRESSOR/NEBULIZER) MISC        09/10/23 1100             Note:  This document was prepared using Dragon voice recognition software and may include unintentional dictation errors.   Sharyn Creamer, MD 09/12/23 [phone]

## 2023-09-04 NOTE — ED Notes (Signed)
Attempted to straight cath pt. Pt contaminated sterile field. Will attempt again.

## 2023-09-04 NOTE — Assessment & Plan Note (Addendum)
Possible SIRS, suspect sepsis Pneumonia versus UTI Patient with cough, foul-smelling urine, leukocytosis, lactic acidosis, soft BP and tachycardia Patient has history of both aspiration pneumonia as well as ESBL E. coli UTI Possible pneumonia versus UTI Sepsis fluids Treat each etiology as described on the respective problem

## 2023-09-04 NOTE — Assessment & Plan Note (Addendum)
Left carotid artery stenosis s/p endarterectomy Continue apixaban and atorvastatin

## 2023-09-05 DIAGNOSIS — A419 Sepsis, unspecified organism: Secondary | ICD-10-CM | POA: Diagnosis not present

## 2023-09-05 LAB — CBC
HCT: 24.2 % — ABNORMAL LOW (ref 39.0–52.0)
Hemoglobin: 7.9 g/dL — ABNORMAL LOW (ref 13.0–17.0)
MCH: 29.3 pg (ref 26.0–34.0)
MCHC: 32.6 g/dL (ref 30.0–36.0)
MCV: 89.6 fL (ref 80.0–100.0)
Platelets: 261 10*3/uL (ref 150–400)
RBC: 2.7 MIL/uL — ABNORMAL LOW (ref 4.22–5.81)
RDW: 15.8 % — ABNORMAL HIGH (ref 11.5–15.5)
WBC: 7.8 10*3/uL (ref 4.0–10.5)
nRBC: 0 % (ref 0.0–0.2)

## 2023-09-05 LAB — COMPREHENSIVE METABOLIC PANEL
ALT: 11 U/L (ref 0–44)
AST: 18 U/L (ref 15–41)
Albumin: 3.4 g/dL — ABNORMAL LOW (ref 3.5–5.0)
Alkaline Phosphatase: 76 U/L (ref 38–126)
Anion gap: 14 (ref 5–15)
BUN: 37 mg/dL — ABNORMAL HIGH (ref 8–23)
CO2: 17 mmol/L — ABNORMAL LOW (ref 22–32)
Calcium: 9.6 mg/dL (ref 8.9–10.3)
Chloride: 106 mmol/L (ref 98–111)
Creatinine, Ser: 1.23 mg/dL (ref 0.61–1.24)
GFR, Estimated: 58 mL/min — ABNORMAL LOW (ref >60)
Glucose, Bld: 146 mg/dL — ABNORMAL HIGH (ref 70–99)
Potassium: 3.8 mmol/L (ref 3.5–5.1)
Sodium: 137 mmol/L (ref 135–145)
Total Bilirubin: 0.5 mg/dL (ref 0.3–1.2)
Total Protein: 7 g/dL (ref 6.5–8.1)

## 2023-09-05 LAB — TROPONIN I (HIGH SENSITIVITY): Troponin I (High Sensitivity): 10 ng/L (ref <18)

## 2023-09-05 LAB — PROCALCITONIN: Procalcitonin: 0.13 ng/mL

## 2023-09-05 LAB — MRSA NEXT GEN BY PCR, NASAL: MRSA by PCR Next Gen: NOT DETECTED

## 2023-09-05 MED ORDER — SODIUM CHLORIDE 0.9 % IV SOLN
3.0000 g | Freq: Four times a day (QID) | INTRAVENOUS | Status: DC
  Administered 2023-09-05 – 2023-09-06 (×2): 3 g via INTRAVENOUS
  Filled 2023-09-05 (×4): qty 8

## 2023-09-05 MED ORDER — FOOD THICKENER (SIMPLYTHICK HONEY)
1.0000 | ORAL | Status: DC | PRN
  Filled 2023-09-05: qty 1

## 2023-09-05 MED ORDER — VANCOMYCIN HCL 750 MG/150ML IV SOLN: 750.0000 mg | INTRAVENOUS | Status: DC

## 2023-09-05 MED ORDER — ALBUTEROL SULFATE (2.5 MG/3ML) 0.083% IN NEBU
2.5000 mg | INHALATION_SOLUTION | Freq: Two times a day (BID) | RESPIRATORY_TRACT | Status: DC
  Administered 2023-09-05 – 2023-09-09 (×8): 2.5 mg via RESPIRATORY_TRACT
  Filled 2023-09-05 (×8): qty 3

## 2023-09-05 NOTE — Progress Notes (Signed)
Mobility Specialist - Progress Note   09/05/23 1458  Mobility  Activity Ambulated with assistance in room;Transferred from chair to bed  Level of Assistance Contact guard assist, steadying assist  Assistive Device Front wheel walker  Distance Ambulated (ft) 4 ft  Activity Response Tolerated well  $Mobility charge 1 Mobility  Mobility Specialist Start Time (ACUTE ONLY) 1446  Mobility Specialist Stop Time (ACUTE ONLY) 1454  Mobility Specialist Time Calculation (min) (ACUTE ONLY) 8 min   Pt sitting in the recliner upon entry, utilizing RA. Pt STS to RW MinA, extra time required upon standing to find center of balance. Pt transferred to bed CGA, Vc's to keep BLE within the walker. Pt left supine with alarm set and needs within reach.  Zetta Bills Mobility Specialist 09/05/23 3:12 PM

## 2023-09-05 NOTE — Evaluation (Signed)
Occupational Therapy Evaluation Patient Details Name: Richard Carter MRN: [id number] DOB: Apr 11, 1941 Today's Date: 09/05/2023   History of Present Illness Pt is an 82 y.o. male that presented to the ED for SOB, hemoptysis, strong odor to urine, workup for sepsis. Significant PMH includes: hx CVA, carotid endarterectomy (03/2023), Afib on Eliquis, HTN, hLD, mixed vascular and Alzheimer's dementia, seizure disorder, alcohol use disorder (in remission).   Clinical Impression   Pt was seen for OT evaluation this date. Prior to hospital admission, pt was living alone with assistance from caregiver named "[name]." He used a RW for all mobility and had S/U, extra time and some assist from [place] at times for ADL performance.  Pt presents to acute OT demonstrating impaired ADL performance and functional mobility 2/2 weakness, SOB, balance and cognitive deficits (baseline) (See OT problem list for additional functional deficits). Pt currently requires CGA for x2 STS from recliner and set up assist to wash his face seated in recliner. He demo LB dressing to doff/don socks with SUP and extra time. Generalized UE weakness noted and ROM deficits in RUE from previous CVA per pt report.  Pt would benefit from skilled OT services to address noted impairments and functional limitations (see below for any additional details) in order to maximize safety and independence while minimizing falls risk and caregiver burden. May benefit from [facility] consult to ensure return to PLOF.      If plan is discharge home, recommend the following: Supervision due to cognitive status;A little help with walking and/or transfers;A little help with bathing/dressing/bathroom;Help with stairs or ramp for entrance    Functional Status Assessment  Patient has had a recent decline in their functional status and demonstrates the ability to make significant improvements in function in a reasonable and predictable amount of time.  Equipment  Recommendations  None recommended by OT    Recommendations for Other Services       Precautions / Restrictions Precautions Precautions: Fall Restrictions Weight Bearing Restrictions: No      Mobility Bed Mobility                    Transfers Overall transfer level: Needs assistance Equipment used: Rolling walker (2 wheels) Transfers: Sit to/from Stand Sit to Stand: Contact guard assist           General transfer comment: x2 STS from recliner to RW with CGA, pt able to take forward and backward steps ~4 each, 8 total      Balance Overall balance assessment: Needs assistance Sitting-balance support: Feet supported Sitting balance-Leahy Scale: Fair     Standing balance support: Bilateral upper extremity supported, Reliant on assistive device for balance Standing balance-Leahy Scale: Fair                             ADL either performed or assessed with clinical judgement   ADL Overall ADL's : Needs assistance/impaired     Grooming: Wash/dry face;Set up   Upper Body Bathing: Sitting           Lower Body Dressing: Supervision/safety Lower Body Dressing Details (indicate cue type and reason): sitting in recliner                     Vision         Perception         Praxis         Pertinent Vitals/Pain Pain Assessment Pain Assessment:  No/denies pain     Extremity/Trunk Assessment Upper Extremity Assessment Upper Extremity Assessment: Generalized weakness   Lower Extremity Assessment Lower Extremity Assessment: Generalized weakness   Cervical / Trunk Assessment Cervical / Trunk Assessment: Normal   Communication Communication Communication: Difficulty communicating thoughts/reduced clarity of speech   Cognition Arousal: Alert Behavior During Therapy: WFL for tasks assessed/performed Overall Cognitive Status: History of cognitive impairments - at baseline                                 General  Comments: pt oriented to self, and [name] his caregiver     General Comments  pt asking for food and drink, edu on NPO at this time    Exercises     Shoulder Instructions      Home Living Family/patient expects to be discharged to:: Private residence Living Arrangements: Alone Available Help at Discharge: Other (Comment) [name] available to assist pt states "I couldn't do it without him") Type of Home: Mobile home Home Access: Stairs to enter Entrance Stairs-Number of Steps: 2 Entrance Stairs-Rails: Left Home Layout: One level     Bathroom Shower/Tub: Producer, television/film/video: Handicapped height Bathroom Accessibility: Yes   Home Equipment: Grab bars - tub/shower;Shower seat;Grab bars - Geophysicist/field seismologist (2 wheels)          Prior Functioning/Environment Prior Level of Function : Needs assist;History of Falls (last six months)  Cognitive Assist : Mobility (cognitive)           Mobility Comments: per pt/visitor, pt uses RW for household distances ADLs Comments: able to perform ADLs with set up per visitor (who is the one that assists at home)        OT Problem List: Decreased strength;Decreased knowledge of use of DME or AE;Decreased cognition;Impaired balance (sitting and/or standing);Decreased safety awareness      OT Treatment/Interventions: Self-care/ADL training;Therapeutic exercise;Patient/family education;Balance training;Therapeutic activities;DME and/or AE instruction    OT Goals(Current goals can be found in the care plan section) Acute Rehab OT Goals Patient Stated Goal: return home OT Goal Formulation: With patient Time For Goal Achievement: 09/19/23 Potential to Achieve Goals: Good ADL Goals Pt Will Perform Lower Body Dressing: with supervision;sit to/from stand Pt Will Transfer to Toilet: with supervision;ambulating Pt Will Perform Toileting - Clothing Manipulation and hygiene: with supervision;sit to/from stand Pt/caregiver  will Perform Home Exercise Program: Increased strength;Both right and left upper extremity;With theraband;With minimal assist  OT Frequency: Min 1X/week    Co-evaluation              AM-PAC OT "6 Clicks" Daily Activity     Outcome Measure Help from another person eating meals?: A Little Help from another person taking care of personal grooming?: A Little Help from another person toileting, which includes using toliet, bedpan, or urinal?: A Little Help from another person bathing (including washing, rinsing, drying)?: A Little Help from another person to put on and taking off regular upper body clothing?: A Little Help from another person to put on and taking off regular lower body clothing?: A Little 6 Click Score: 18   End of Session Equipment Utilized During Treatment: Rolling walker (2 wheels)  Activity Tolerance: Patient tolerated treatment well Patient left: in chair;with call bell/phone within reach;with chair alarm set  OT Visit Diagnosis: Unsteadiness on feet (R26.81);Other abnormalities of gait and mobility (R26.89);Muscle weakness (generalized) (M62.81)  Time: 7253-6644 OT Time Calculation (min): 19 min Charges:  OT General Charges $OT Visit: 1 Visit OT Evaluation $OT Eval Moderate Complexity: 1 Mod OT Treatments $Therapeutic Activity: 8-22 mins Bria Sparr, OTR/L 09/05/23, 1:37 PM  Shahzad Thomann E Gae Bihl 09/05/2023, 1:34 PM

## 2023-09-05 NOTE — Progress Notes (Signed)
Progress Note   Patient: Richard Carter ZDG:[id number] DOB: 11-29-1940 DOA: 09/04/2023     1 DOS: the patient was seen and examined on 09/05/2023   Brief hospital course: 81 y.o. male with medical history significant for CVA with residual dysphagia on a pured and honey thick diet, carotid artery stenosis s/p left CEA, seizure disorder, dementia, A-fib on Eliquis, HTN, HLD,  COPD, tobacco use disorder,  with last hospitalization 8/16 to 07/18/2023 with acute encephalopathy attributed to ESBL E. coli UTI treated with ertapenem as well as possible seizure, who presents for evaluation of shortness of blood with possible hemoptysis and a strong odor to his urine.  History is given by POA Jackalyn Lombard over the phone who stated that patient was in his usual state of health until today when he noted that patient was coughing much more than his usual and was bringing up blood-streaked phlegm.  He also appeared very short of breath he denied chest pain, abdominal pain and had no vomiting, abdominal pain or diarrhea.  Also had no fever or chills.  He also noted that since the day prior patient has had a very strong odor to his urine and he is concerned about a UTI.  At baseline patient maintains independence in ADLs.  His POA is his full-time caregiver and he has home health coming out 3 times a week. ED course and data review: Mildly tachycardic at 107 with BP 103/56.  Afebrile.  Respiratory rate in the intermittently up to 24, O2 sat maintained in the mid 90s. Labs: Notable for WBC 16,000 With lactic acid 2.3-2.2.  Respiratory viral panel negative for COVID flu.  VBG unremarkable. Hemoglobin at baseline at 9.8 BMP unremarkable except for bicarb of 19 Lipase and LFTs normal as well as ammonia Urinalysis pending EKG, personally viewed and interpreted sinus tach.  Specific ST-T wave changes. CT head nonacute and showed chronic ischemic injury   Assessment and Plan:  Sepsis  / Possible SIRS, suspect sepsis /  Pneumonia versus UTI :  Patient with cough, foul-smelling urine, leukocytosis, lactic acidosis, soft BP and tachycardia Patient has history of both aspiration pneumonia as well as ESBL E. coli UTI Possible pneumonia versus UTI Sepsis fluids Treat each etiology as described on the respective problem  Acute cystitis / History of ESBL E. coli UTI 06/2023 :  POA reports 2 days of foul-smelling urine CT abdomen and pelvis showing circumferential bladder wall thickening which may represent cystitis Follow-up urinalysis Continue above antibiotics as it will cover UTI, but may need ertapenem given history of ESBL Follow cultures  HCAP (healthcare-associated pneumonia) / Dysphagia as late effect of CVA with prior history of aspiration pneumonia :  Patient was noted to be coughing excessively on the day of arrival with blood-tinged sputum At baseline is on soft pured diet with honey thick liquids with which she is compliant per POA Will keep n.p.o. Aspiration precautions Rocephin, azithromycin and vancomycin Speech therapy evaluation  Esophagitis  Patient with coughing and on dysphagia diet CT abdomen and pelvis shows wall thickening of the mid and distal esophagus which can be seen with reflux or esophagitis Protonix IV No recent EGD noted on record Aspiration precautions  AF (paroxysmal atrial fibrillation)  Continue Eliquis Not currently on rate control agents per review of current med list  HTN (hypertension) Holding antihypertensives due to soft blood pressure in the setting of sepsis  History of stroke / Left carotid artery stenosis s/p endarterectomy Continue apixaban and atorvastatin  Seizure disorder :  Continue Keppra 500 mg twice daily  Dementia :  Continue trazodone at bedtime Delirium precautions      Subjective: Pt seen and examined. No overnight events.   Physical Exam: Vitals:   09/04/23 2341 09/04/23 2354 09/05/23 0214 09/05/23 0730  BP: 118/78 123/65   (!) 125/59  Pulse:  71  65  Resp:  18  18  Temp: 98.7 F (37.1 C) 98.4 F (36.9 C)  98 F (36.7 C)  TempSrc:  Oral    SpO2:  100% 100% 100%  Weight:      Height:       Physical Exam Constitutional:      Comments: Ill appearing frail   HENT:     Head: Normocephalic and atraumatic.  Eyes:     Pupils: Pupils are equal, round, and reactive to light.  Cardiovascular:     Rate and Rhythm: Normal rate.  Pulmonary:     Comments: Basal fine crackles Abdominal:     Palpations: Abdomen is soft.  Musculoskeletal:        General: Normal range of motion.  Skin:    General: Skin is dry.  Neurological:     General: No focal deficit present.  Psychiatric:        Mood and Affect: Mood normal.     Data Reviewed:  There are no new results to review at this time.  Family Communication: Number bedside  Disposition: Status is: Inpatient Remains inpatient appropriate because: Sepsis  Planned Discharge Destination: Home    Time spent: 35 minutes  Author: Kirstie Peri, MD 09/05/2023 3:03 PM  For on call review [email or web address].

## 2023-09-05 NOTE — TOC Initial Note (Signed)
Transition of Care [facility]) - Initial/Assessment Note    Patient Details  Name: Richard Carter MRN: [id number] Date of Birth: 10-27-1941  Transition of Care [facility]) CM/SW Contact:    Margarito Liner, LCSW Phone Number: 09/05/2023, 1:08 PM  Clinical Narrative:    Readmission prevention screen complete. Patient is not fully oriented. No family at bedside. CSW called his HCPOA, Renaldo Reel, introduced role, and explained that discharge planning would be discussed. PCP is Dr. Burnadette Pop. [name] drives him to appointments. Pharmacy is [facility] [address] [employer] [address] [employer]. No issues obtaining medications. [name] stays with the patient. Patient is active with [facility] for PT, OT, and speech. [name] reports that patient has all needed DME. Per chart review, patient went to [facility] 7/17-8/1 (15 days) and 8/23-9/6 (14 days). No further concerns. CSW encouraged patient to contact CSW as needed. CSW will continue to follow patient for support and facilitate return home once stable. [name] will transport patient home at discharge.              Expected Discharge Plan: Home w Home Health Services Barriers to Discharge: Continued Medical Work up   Patient Goals and CMS Choice     Choice offered to / list presented to : [facility] POA / Guardian      Expected Discharge Plan and Services     Post Acute Care Choice: Resumption of Svcs/PTA Provider Living arrangements for the past 2 months: Single Family Home                           HH Arranged: PT, OT, Speech Therapy HH Agency: [facility] Date [facility] Agency Contacted: 09/05/23   Representative spoke with at [facility] Agency: Gearldine Bienenstock  Prior Living Arrangements/Services Living arrangements for the past 2 months: Single Family Home Lives with:: Other (Comment) (HCPOA) Patient language and need for interpreter reviewed:: Yes Do you feel safe going back to the place where you live?: Yes      Need for Family Participation in  Patient Care: Yes (Comment) Care giver support system in place?: Yes (comment) Current home services: DME, Home OT, Home PT, Other (comment) (Home ST) Criminal Activity/Legal Involvement Pertinent to Current Situation/Hospitalization: No - Comment as needed  Activities of Daily Living      Permission Sought/Granted Permission sought to share information with : Facility Medical sales representative, Other (comment)    Share Information with NAME: Renaldo Reel  Permission granted to share info w AGENCY: [facility]  Permission granted to share info w Relationship: HCPOA  Permission granted to share info w Contact Information: [phone]  Emotional Assessment   Attitude/Demeanor/Rapport: Unable to Assess Affect (typically observed): Unable to Assess Orientation: : Oriented to Self, Oriented to Place Alcohol / Substance Use: Not Applicable Psych Involvement: No (comment)  Admission diagnosis:  Cystitis [N30.90] Sepsis (HCC) [A41.9] Community acquired pneumonia, unspecified laterality [J18.9] Sepsis, due to unspecified organism, unspecified whether acute organ dysfunction present [facility]) [A41.9] Patient Active Problem List   Diagnosis Date Noted   HCAP (healthcare-associated pneumonia) 09/04/2023   History of ESBL E. coli UTI 06/2023 09/04/2023   Esophagitis 09/04/2023   Acute cystitis 09/04/2023   Sepsis (HCC) 09/04/2023   Hypokalemia 07/16/2023   Seizure disorder (HCC) 07/11/2023   History of alcohol use disorder 05/31/2023   Goals of care, counseling/discussion 04/03/2023   Malnutrition of moderate degree 04/03/2023   Stroke (HCC) 03/27/2023   Stenosis of left carotid artery  s/p endarterectomy 02/20/2023   Altered mental status 02/20/2023   Acute stroke due to ischemia (HCC) 02/17/2023   Stroke-like symptom 02/16/2023   Leukocytosis 02/16/2023   Anemia of chronic disease 07/22/2022   Protein-calorie malnutrition, severe 05/28/2022   Dysphagia as late effect of  cerebrovascular accident (CVA) 05/28/2022   Acute metabolic encephalopathy 05/27/2022   History of stroke 05/27/2022   Aspiration pneumonia (HCC) 04/23/2022   Nicotine dependence    Elevated troponin    Acute CVA (cerebrovascular accident) (HCC) 05/07/2021   Non compliance w medication regimen 05/07/2021   Dementia (HCC) 05/07/2021   AF (paroxysmal atrial fibrillation) (HCC) 05/07/2021   HTN (hypertension) 11/29/2020   Alcohol abuse 11/29/2020   AKI (acute kidney injury) (HCC) 11/29/2020   Atrial flutter with rapid ventricular response (HCC) 11/29/2020   Seizures (HCC) 02/22/2020   Hypomagnesemia 10/29/2019   Pubic ramus fracture (HCC) 07/29/2019   Acetabular fracture (HCC) 07/29/2019   HLD (hyperlipidemia) 07/29/2019   GERD (gastroesophageal reflux disease) 07/29/2019   UTI (urinary tract infection) 07/29/2019   Vitamin D deficiency 09/29/2015   PCP:  Marisue Ivan, MD Pharmacy:   [facility] [address]), [place] - [address] [place] [address] Phone: [phone] Fax: [phone]     Social Determinants of Health (SDOH) Social History: SDOH Screenings   Food Insecurity: No Food Insecurity (08/11/2023)   Received from [facility]  Housing: Patient Declined (07/12/2023)  Transportation Needs: No Transportation Needs (08/11/2023)   Received from [facility]  Utilities: Not At Risk (08/11/2023)   Received from [facility]  Financial Resource Strain: Low Risk  (08/11/2023)   Received from [facility]  Tobacco Use: Medium Risk (08/11/2023)   Received from [facility]   SDOH Interventions:     Readmission Risk Interventions    09/05/2023    1:05 PM 04/21/2022    1:55 PM  Readmission Risk Prevention Plan  Post Dischage Appt  Complete  Medication Screening  Complete  Transportation Screening Complete Complete   Medication Review (RN Care Manager) Complete   PCP or Specialist appointment within 3-5 days of discharge --   HRI or Home Care Consult Complete   SW Recovery Care/Counseling Consult Complete   Palliative Care Screening Not Applicable   Skilled Nursing Facility Not Applicable

## 2023-09-05 NOTE — Plan of Care (Signed)
  Problem: Fluid Volume: Goal: Hemodynamic stability will improve Outcome: Progressing   Problem: Clinical Measurements: Goal: Diagnostic test results will improve Outcome: Progressing Goal: Signs and symptoms of infection will decrease Outcome: Progressing   Problem: Respiratory: Goal: Ability to maintain adequate ventilation will improve Outcome: Progressing   Problem: Education: Goal: Knowledge of disease or condition will improve Outcome: Progressing Goal: Knowledge of secondary prevention will improve (MUST DOCUMENT ALL) Outcome: Progressing Goal: Knowledge of patient specific risk factors will improve (Mark N/A or DELETE if not current risk factor) Outcome: Progressing   Problem: Ischemic Stroke/TIA Tissue Perfusion: Goal: Complications of ischemic stroke/TIA will be minimized Outcome: Progressing   Problem: Coping: Goal: Will verbalize positive feelings about self Outcome: Progressing Goal: Will identify appropriate support needs Outcome: Progressing   Problem: Health Behavior/Discharge Planning: Goal: Ability to manage health-related needs will improve Outcome: Progressing Goal: Goals will be collaboratively established with patient/family Outcome: Progressing   Problem: Self-Care: Goal: Ability to participate in self-care as condition permits will improve Outcome: Progressing Goal: Verbalization of feelings and concerns over difficulty with self-care will improve Outcome: Progressing Goal: Ability to communicate needs accurately will improve Outcome: Progressing   Problem: Nutrition: Goal: Risk of aspiration will decrease Outcome: Progressing Goal: Dietary intake will improve Outcome: Progressing   Problem: Education: Goal: Knowledge of General Education information will improve Description: Including pain rating scale, medication(s)/side effects and non-pharmacologic comfort measures Outcome: Progressing   Problem: Health Behavior/Discharge  Planning: Goal: Ability to manage health-related needs will improve Outcome: Progressing   Problem: Clinical Measurements: Goal: Ability to maintain clinical measurements within normal limits will improve Outcome: Progressing Goal: Will remain free from infection Outcome: Progressing Goal: Diagnostic test results will improve Outcome: Progressing Goal: Respiratory complications will improve Outcome: Progressing Goal: Cardiovascular complication will be avoided Outcome: Progressing   Problem: Activity: Goal: Risk for activity intolerance will decrease Outcome: Progressing   Problem: Nutrition: Goal: Adequate nutrition will be maintained Outcome: Progressing   Problem: Coping: Goal: Level of anxiety will decrease Outcome: Progressing   Problem: Elimination: Goal: Will not experience complications related to bowel motility Outcome: Progressing Goal: Will not experience complications related to urinary retention Outcome: Progressing   Problem: Pain Managment: Goal: General experience of comfort will improve Outcome: Progressing   Problem: Safety: Goal: Ability to remain free from injury will improve Outcome: Progressing   Problem: Skin Integrity: Goal: Risk for impaired skin integrity will decrease Outcome: Progressing   

## 2023-09-05 NOTE — Evaluation (Signed)
Physical Therapy Evaluation Patient Details Name: Richard Carter MRN: [id number] DOB: 03/29/1941 Today's Date: 09/05/2023  History of Present Illness  Pt is an 82 y.o. male that presented to the ED for SOB, hemoptysis, strong odor to urine, workup for sepsis. Significant PMH includes: hx CVA, carotid endarterectomy (03/2023), Afib on Eliquis, HTN, hLD, mixed vascular and Alzheimer's dementia, seizure disorder, alcohol use disorder (in remission).   Clinical Impression  Pt alert, and oriented to self, "hospital", and visitor in room. Pt unable to describe PLOF but visitor stated at baseline he is ambulatory with his RW, CGA and can perform his own ADLs (with set up assistance) including pericare. Reported at least 1 fall in the last 6 months.  The patient was able to sit EOB with bed rails and supervision. Fair sitting balance noted. Sit <> stand from EOB and from recliner with RW and CGA, but needed to gather momentum. CGA for ambulation in room ~17ft with RW. Pt tended to have RW outside BOS despite education. No LOB noted but pt did appear mildly fatigued with activity. spO2 at rest >90%.  Overall the patient demonstrated deficits (see "PT Problem List") that impede the patient's functional abilities, safety, and mobility and would benefit from skilled PT intervention.         If plan is discharge home, recommend the following: Supervision due to cognitive status;A little help with walking and/or transfers;A little help with bathing/dressing/bathroom;Direct supervision/assist for medications management;Assist for transportation;Assistance with cooking/housework;Help with stairs or ramp for entrance   Can travel by private vehicle        Equipment Recommendations None recommended by PT  Recommendations for Other Services       Functional Status Assessment Patient has had a recent decline in their functional status and demonstrates the ability to make significant improvements in function in  a reasonable and predictable amount of time.     Precautions / Restrictions Precautions Precautions: Fall Restrictions Weight Bearing Restrictions: No      Mobility  Bed Mobility Overal bed mobility: Needs Assistance Bed Mobility: Supine to Sit     Supine to sit: Supervision, Used rails, HOB elevated     General bed mobility comments: extra time    Transfers Overall transfer level: Needs assistance Equipment used: Rolling walker (2 wheels) Transfers: Sit to/from Stand Sit to Stand: Contact guard assist           General transfer comment: needed momentum from recliner    Ambulation/Gait   Gait Distance (Feet): 17 Feet Assistive device: Rolling walker (2 wheels)         General Gait Details: pt with RW outside BOS despite education, no LOB but pt appeared mildly fatigued with activity. spO2 WFLs at end of session  Stairs            Wheelchair Mobility     Tilt Bed    Modified Rankin (Stroke Patients Only)       Balance Overall balance assessment: Needs assistance Sitting-balance support: Feet supported Sitting balance-Leahy Scale: Fair       Standing balance-Leahy Scale: Fair                               Pertinent Vitals/Pain Pain Assessment Pain Assessment: Faces Faces Pain Scale: No hurt    Home Living Family/patient expects to be discharged to:: Private residence Living Arrangements: Alone Available Help at Discharge: Other (Comment) [name]" is available to assist pt)  Type of Home: Mobile home Home Access: Stairs to enter Entrance Stairs-Rails: Left Entrance Stairs-Number of Steps: 2   Home Layout: One level Home Equipment: Grab bars - tub/shower;Shower seat;Grab bars - Geophysicist/field seismologist (2 wheels)      Prior Function               Mobility Comments: per pt/visitor, pt uses RW for household distances ADLs Comments: able to perform ADLs with set up per visitor (who is the one that assists at  home)     Extremity/Trunk Assessment   Upper Extremity Assessment Upper Extremity Assessment: Generalized weakness    Lower Extremity Assessment Lower Extremity Assessment: Generalized weakness    Cervical / Trunk Assessment Cervical / Trunk Assessment: Kyphotic  Communication      Cognition Arousal: Alert Behavior During Therapy: WFL for tasks assessed/performed Overall Cognitive Status: History of cognitive impairments - at baseline                                 General Comments: pt oriente to self, "hospital" visitor in room        General Comments      Exercises     Assessment/Plan    PT Assessment Patient needs continued PT services  PT Problem List Decreased strength;Pain;Decreased range of motion;Decreased activity tolerance;Decreased balance;Decreased mobility       PT Treatment Interventions DME instruction;Neuromuscular re-education;Gait training;Stair training;Patient/family education;Functional mobility training;Therapeutic activities;Therapeutic exercise;Balance training    PT Goals (Current goals can be found in the Care Plan section)  Acute Rehab PT Goals PT Goal Formulation: Patient unable to participate in goal setting Time For Goal Achievement: 09/19/23 Potential to Achieve Goals: Good    Frequency Min 1X/week     Co-evaluation               AM-PAC PT "6 Clicks" Mobility  Outcome Measure Help needed turning from your back to your side while in a flat bed without using bedrails?: A Little Help needed moving from lying on your back to sitting on the side of a flat bed without using bedrails?: A Little Help needed moving to and from a bed to a chair (including a wheelchair)?: A Little Help needed standing up from a chair using your arms (e.g., wheelchair or bedside chair)?: A Little Help needed to walk in hospital room?: A Little Help needed climbing 3-5 steps with a railing? : A Little 6 Click Score: 18    End of  Session Equipment Utilized During Treatment: Gait belt Activity Tolerance: Patient tolerated treatment well Patient left: in chair;with call bell/phone within reach;with chair alarm set Nurse Communication: Mobility status PT Visit Diagnosis: Other abnormalities of gait and mobility (R26.89)    Time: 6213-0865 PT Time Calculation (min) (ACUTE ONLY): 23 min   Charges:   PT Evaluation $PT Eval Low Complexity: 1 Low PT Treatments $Therapeutic Activity: 8-22 mins PT General Charges $$ ACUTE PT VISIT: 1 Visit        Olga Coaster PT, DPT 12:57 PM,09/05/23

## 2023-09-05 NOTE — Progress Notes (Addendum)
Pharmacy Antibiotic Note  Richard Carter is a 82 y.o. male admitted on 09/04/2023 with sepsis.  Pharmacy has been consulted for Vancomycin dosing.  Plan: Cefepime 2 gm IV Q12H started on 10/10 @ 2335.  Vancomycin 1500 mg IV X 1 given on 10/11 @ 0028. Vancomycin 750 mg IV Q24H ordered to start on 10/12 @ 0028.  AUC = 514.9 Vanc trough = 14.5   Height: 5\' 2"  (157.5 cm) Weight: 61.2 kg (135 lb) IBW/kg (Calculated) : 54.6  Temp (24hrs), Avg:98.4 F (36.9 C), Min:98.1 F (36.7 C), Max:98.7 F (37.1 C)  Recent Labs  Lab 09/04/23 1124 09/04/23 1519 09/04/23 1715 09/05/23 0028  WBC 15.9*  --   --  7.8  CREATININE 1.18  --   --  1.23  LATICACIDVEN  --  2.3* 2.2*  --     Estimated Creatinine Clearance: 34.5 mL/min (by C-G formula based on SCr of 1.23 mg/dL).    No Known Allergies  Antimicrobials this admission:   >>    >>   Dose adjustments this admission:   Microbiology results:  BCx:   UCx:    Sputum:    MRSA PCR:   Thank you for allowing pharmacy to be a part of this patient's care.  Marky Buresh D 09/05/2023 1:45 AM

## 2023-09-05 NOTE — Progress Notes (Signed)
Initial Nutrition Assessment  DOCUMENTATION CODES:   Severe malnutrition in context of chronic illness  INTERVENTION:   -Magic cup TID with meals, each supplement provides 290 kcal and 9 grams of protein  -MVI with minerals daily  NUTRITION DIAGNOSIS:   Severe Malnutrition related to chronic illness (COPD, dementia) as evidenced by moderate fat depletion, severe fat depletion, moderate muscle depletion, severe muscle depletion, percent weight loss.  GOAL:   Patient will meet greater than or equal to 90% of their needs  MONITOR:   PO intake, Supplement acceptance  REASON FOR ASSESSMENT:   Consult Assessment of nutrition requirement/status  ASSESSMENT:   Ptwith medical history significant for CVA with residual dysphagia on a pured and honey thick diet, carotid artery stenosis s/p left CEA, seizure disorder, dementia, A-fib on Eliquis, HTN, HLD,  COPD, tobacco use disorder presents for evaluation of shortness of blood with possible hemoptysis and a strong odor to his urine.  Pt admitted with sepsis, pneumonia vs UTI.   Reviewed I/O's: +845 ml x 24 hours   Spoke with pt at bedside, who just finished working with mobility tech at time of visit. Pt pleasant, but unable to offer much history. He complains that he is hungry and wants something to eat; case discussed with SLP, diet just resumed to dysphagia 1 diet with honey thick liquids. No plan to advance diet further secondary to significant aspiration risk and hiatal hernia. SLP suspect pt may have episodes of reflux, early satiety, and indigestion due to this.   Pt reports he was eating well PTA, however, unable to provide further insight into this. When asked is he followed a dysphagia 1 diet with honey thick liquids PTA, pt reports "I reckon so".   Pt unsure if he has lost weight. Reviewed wt hx; pt has experienced a 18.2% wt loss over the past 3 months, which is significant for time frame. Pt's exam has worsened compared to  last time RD saw him.   Medications reviewed and include vitamin C, keppra, senokot, and vitamin C.   Lab Results  Component Value Date   HGBA1C 5.8 (H) 03/27/2023   PTA DM medications are none.   Labs reviewed: CBGS: 130 (inpatient orders for glycemic control are none).    NUTRITION - FOCUSED PHYSICAL EXAM:  Flowsheet Row Most Recent Value  Orbital Region Severe depletion  Upper Arm Region Severe depletion  Thoracic and Lumbar Region Severe depletion  Buccal Region Severe depletion  Temple Region Severe depletion  Clavicle Bone Region Severe depletion  Clavicle and Acromion Bone Region Severe depletion  Scapular Bone Region Severe depletion  Dorsal Hand Severe depletion  Patellar Region Severe depletion  Anterior Thigh Region Severe depletion  Posterior Calf Region Severe depletion  Edema (RD Assessment) None  Hair Reviewed  Eyes Reviewed  Mouth Reviewed  Skin Reviewed  Nails Reviewed       Diet Order:   Diet Order             DIET - DYS 1 Room service appropriate? No; Fluid consistency: Honey Thick  Diet effective now                   EDUCATION NEEDS:   Education needs have been addressed  Skin:  Skin Assessment: Reviewed RN Assessment  Last BM:  Unknown  Height:   Ht Readings from Last 1 Encounters:  09/04/23 5\' 2"  (1.575 m)    Weight:   Wt Readings from Last 1 Encounters:  09/04/23 61.2 kg  Ideal Body Weight:  50 kg  BMI:  Body mass index is 24.69 kg/m.  Estimated Nutritional Needs:   Kcal:  1800-2000  Protein:  90-105 grams  Fluid:  > 1.8 L    Levada Schilling, RD, LDN, CDCES Registered Dietitian III Certified Diabetes Care and Education Specialist Please refer to [facility] for RD and/or RD on-call/weekend/after hours pager

## 2023-09-05 NOTE — Plan of Care (Signed)
  Problem: Respiratory: Goal: Ability to maintain adequate ventilation will improve Outcome: Progressing   Problem: Nutrition: Goal: Risk of aspiration will decrease Outcome: Progressing Goal: Dietary intake will improve Outcome: Progressing   Problem: Nutrition: Goal: Adequate nutrition will be maintained Outcome: Progressing   Problem: Coping: Goal: Level of anxiety will decrease Outcome: Progressing

## 2023-09-05 NOTE — Evaluation (Addendum)
Clinical/Bedside Swallow Evaluation Patient Details  Name: Richard Carter MRN: [id number] Date of Birth: 09-28-1941  Today's Date: 09/05/2023 Time: SLP Start Time (ACUTE ONLY): 1405 SLP Stop Time (ACUTE ONLY): 1505 SLP Time Calculation (min) (ACUTE ONLY): 60 min  Past Medical History:  Past Medical History:  Diagnosis Date   Alcohol abuse    Dementia (HCC)    GERD (gastroesophageal reflux disease)    HLD (hyperlipidemia)    HTN (hypertension)    Seizures (HCC)    Stroke [facility])    Past Surgical History:  Past Surgical History:  Procedure Laterality Date   ENDARTERECTOMY Left 04/02/2023   Procedure: ENDARTERECTOMY CAROTID;  Surgeon: Renford Dills, MD;  Location: [facility] ORS;  Service: Vascular;  Laterality: Left;   skin cyst removal     HPI:  Pt is a 82 y.o. male with medical history significant for MULTIPLE CVAs per Imaging with residual oropharyngeal phase dysphagia(on a pured and honey consistency liquids diet), Dementia, carotid artery stenosis s/p left CEA, seizure disorder, A-fib on Eliquis, HTN, HLD,  COPD, tobacco use disorder, who last had a hospitalization 07/11/23-07/18/2023 with acute Encephalopathy attributed to ESBL E. coli UTI which was treated with ertapenem as well as possible seizure.  He presents this admit on 09/04/23 for evaluation of shortness of breath with possible hemoptysis post coughing and a strong odor to his urine.  HCPOA Jackalyn Lombard gave information to ED MD over the phone and stated that patient was in his usual state of health until he noted that patient was coughing much more than his usual and was bringing up blood-streaked phlegm.  He also appeared very short of breath he denied chest pain, abdominal pain and had no vomiting, abdominal pain or diarrhea.  Also had no fever or chills.  He also noted that since the day prior patient has had a very strong odor to his urine and he is concerned about a UTI again.  The pt has a full-time caregiver, Jackalyn Lombard,  and he has home health coming out 3 times a week.    Per MRI 06/2023: "No acute infarction, hemorrhage, hydrocephalus, extra-axial  collection or mass lesion. Chronic superior left frontal infarct  which is moderate to large. Moderate chronic right parietal cortex  infarct. Small chronic right occipital cortex infarct. Ischemic  gliosis in the cerebral white matter. Chronic lacunar infarct in the  right paramedian pons. Pronounced brain atrophy.".  Per Imaging this admit: "Mild bronchial thickening. Ground-glass opacities in the  dependent right lower lobe may be hypoventilatory, non but  nonspecific. Infection is not entirely excluded.  3. Moderate-sized hiatal hernia. Wall thickening of the mid and  distal esophagus, can be seen with reflux or esophagitis.".  Previous Imaging: pt as a"has a "moderate to large volume Hiatal Hernia containing the majority of the of the stomach".    Assessment / Plan / Recommendation  Clinical Impression   Pt seen for swallowing assessment at bedside.  Per MBSS performed 07/17/2023 at [facility]: "Pateint presents with a moderate oropharyngeal dysphagia. Orally, patient with lingual pumping across consistencies and mastication of solid boluses beyond puree is incomplete resulting in larger peices of boluses falling over the base of the tongue and into the airway prior to and during the swallow. Additionally, combination of delay in swallow initiation and decreased laryngeal closure results in relatively consistent aspiration of liquids thinner than honey thick. Unfortunately use of tsp for liquids unsuccessful to consistently protect the airway. Use of chin tuck only consistent with  nectar thick liquids although note that patient does not always use consistently and will requirei max assistance for this during po intake. Airway was fully protected with both pureed solids and honey thick liquids during today's study. At this time, will downgrade diet accordingly. Concern that  use of honey thick liquids long term could contribute to dehydration.".  Patient appears to present with what appears to be Moderate+, chronic oropharyngeal dysphagia with contributing factors of cognitive deficits(Dementia at baseline; CVAs; ETOH abuse history), generalized weakness, sensorimotor deficits, and structural age-related changes. He has KNOWN Aspiration of thin liquids, soft solids. Pt is only wearing an Upper Denture plate; missing lower dentition. He has KNOWN Esophageal phase Dysmotility w/ a Mod-Large Hiatal Hernia -- this can significantly increase risk for REGURGITATION of po's post intake thus increase risk for Aspiration of REFLUX material which can then lead to Pulmonary decline.   Pt appeared to adequately swallow trials of Honey consistency liquids and Purees w/ no overt clinical s/s of aspiration noted at bedside; no decline in respiratory status and no coughing. Oral phase was functional for bolus management of trial consistencies.   In setting of pt's BASELINE and KNOWN Dysphagia and RISK FOR ASPIRATION, recommend the Dysphagia level 1 (puree) w/ HONEY consistency liquids via cup; aspiration precautions; Supervision w/ meals for follow through w/ precautions. Oral care prior to/post meals. Dietician following. Precautions posted.  Recommend Palliative Care consult for discussion of GOC moving forward re: pt's oropharyngeal-pharyngoesophageal phase Dysphagia and the impact of a Dysphagia diet as well as Dehydration issues, overall oral intake, and Pleasure/QOL.   The above was discussed w/ NSG and MD; TOC.  No further Acute ST services indicated; pt to continue his therapy at home w/ HHST services (per TOC).   SLP Visit Diagnosis: Dysphagia, oropharyngeal phase (R13.12);Dysphagia, pharyngoesophageal phase (R13.14) (Esophageal phase Dysmotility w/ risk for aspiration of REFLUX material; Dementia; CVAs)    Aspiration Risk  Moderate aspiration risk;Severe aspiration risk;Risk for  inadequate nutrition/hydration    Diet Recommendation   Honey;Dysphagia 1 (puree) (BASELINE) = Dysphagia level 1 (puree) w/ HONEY consistency liquids via cup; aspiration precautions; Supervision w/ meals for follow through w/ precautions. Oral care prior to/post meals.   Medication Administration: Crushed with puree    Other  Recommendations Recommended Consults: Consider GI evaluation;Consider esophageal assessment (Palliative Care consult for GOC; Dietician f/u) Oral Care Recommendations: Oral care BID;Staff/trained caregiver to provide oral care (Denture care) Caregiver Recommendations: Avoid jello, ice cream, thin soups, popsicles;Remove water pitcher;Have oral suction available    Recommendations for follow up therapy are one component of a multi-disciplinary discharge planning process, led by the attending physician.  Recommendations may be updated based on patient status, additional functional criteria and insurance authorization.  Follow up Recommendations Home health SLP (ongoingin in the home)      Assistance Recommended at Discharge  FULL at meals  Functional Status Assessment Patient has had a recent decline in their functional status and/or demonstrates limited ability to make significant improvements in function in a reasonable and predictable amount of time  Frequency and Duration  (n/a)   (n/a)       Prognosis Prognosis for improved oropharyngeal function: Guarded Barriers to Reach Goals: Cognitive deficits;Language deficits;Time post onset;Severity of deficits Barriers/Prognosis Comment: Esophageal phase Dysmotility d/t Hiatal Hernia w/ risk for aspiration of REFLUX material; Dementia; CVAs; missing bottom dentition; need for Supervision w/ po's      Swallow Study   General Date of Onset: 09/04/23 HPI: Pt is a 82  y.o. male with medical history significant for MULTIPLE CVAs per Imaging with residual oropharyngeal phase dysphagia(on a pured and honey consistency liquids  diet), Dementia, carotid artery stenosis s/p left CEA, seizure disorder, A-fib on Eliquis, HTN, HLD,  COPD, tobacco use disorder, who last had a hospitalization 07/11/23-07/18/2023 with acute Encephalopathy attributed to ESBL E. coli UTI which was treated with ertapenem as well as possible seizure.  He presents this admit on 09/04/23 for evaluation of shortness of breath with possible hemoptysis post coughing and a strong odor to his urine.  HCPOA Jackalyn Lombard gave information to ED MD over the phone and stated that patient was in his usual state of health until he noted that patient was coughing much more than his usual and was bringing up blood-streaked phlegm.  He also appeared very short of breath he denied chest pain, abdominal pain and had no vomiting, abdominal pain or diarrhea.  Also had no fever or chills.  He also noted that since the day prior patient has had a very strong odor to his urine and he is concerned about a UTI again.  The pt has a full-time caregiver, Jackalyn Lombard, and he has home health coming out 3 times a week.    Per MRI 06/2023: "No acute infarction, hemorrhage, hydrocephalus, extra-axial  collection or mass lesion. Chronic superior left frontal infarct  which is moderate to large. Moderate chronic right parietal cortex  infarct. Small chronic right occipital cortex infarct. Ischemic  gliosis in the cerebral white matter. Chronic lacunar infarct in the  right paramedian pons. Pronounced brain atrophy.".  Per Imaging this admit: "Mild bronchial thickening. Ground-glass opacities in the  dependent right lower lobe may be hypoventilatory, non but  nonspecific. Infection is not entirely excluded.  3. Moderate-sized hiatal hernia. Wall thickening of the mid and  distal esophagus, can be seen with reflux or esophagitis.".  Previous Imaging: pt as a"has a "moderate to large volume Hiatal Hernia containing the majority of the of the stomach". Type of Study: Bedside Swallow Evaluation Previous  Swallow Assessment: MBSS 06/2023; multiple other BSEs, txs, and MBSSs Diet Prior to this Study: Dysphagia 1 (pureed);Moderately thick liquids (Level 3, honey thick) (per MBSS 06/2023) Temperature Spikes Noted: No (wbc 7.8) Respiratory Status: Room air History of Recent Intubation: No Behavior/Cognition: Alert;Cooperative;Pleasant mood;Confused;Distractible;Requires cueing (baseline Dementia) Oral Cavity Assessment: Dry Oral Care Completed by SLP: Yes Oral Cavity - Dentition: Dentures, top (Edentulous on bottom) Vision: Functional for self-feeding Self-Feeding Abilities: Able to feed self;Needs assist;Needs set up (montiored) Patient Positioning: Upright in chair (needed support) Baseline Vocal Quality: Normal Volitional Cough: Strong;Congested Volitional Swallow: Able to elicit    Oral/Motor/Sensory Function     Ice Chips Ice chips: Not tested   Thin Liquid Thin Liquid: Not tested    Nectar Thick Nectar Thick Liquid: Not tested   Honey Thick Honey Thick Liquid: Within functional limits Presentation: Cup;Self fed;Spoon (3 via tsp; ~3 ozs via Cup) Other Comments: cues to take small sips slowly   Puree Puree: Within functional limits Presentation: Self Fed;Spoon (supported; ~4 ozs) Other Comments: cues given   Solid     Solid: Not tested        Jerilynn Som, MS, CCC-SLP Speech Language Pathologist Rehab Services; [facility] [place] [phone] (ascom) Xavia Kniskern 09/05/2023,4:33 PM

## 2023-09-06 DIAGNOSIS — A419 Sepsis, unspecified organism: Secondary | ICD-10-CM | POA: Diagnosis not present

## 2023-09-06 LAB — CBC
HCT: 22.5 % — ABNORMAL LOW (ref 39.0–52.0)
Hemoglobin: 7.4 g/dL — ABNORMAL LOW (ref 13.0–17.0)
MCH: 29.7 pg (ref 26.0–34.0)
MCHC: 32.9 g/dL (ref 30.0–36.0)
MCV: 90.4 fL (ref 80.0–100.0)
Platelets: 250 10*3/uL (ref 150–400)
RBC: 2.49 MIL/uL — ABNORMAL LOW (ref 4.22–5.81)
RDW: 16.2 % — ABNORMAL HIGH (ref 11.5–15.5)
WBC: 10.9 10*3/uL — ABNORMAL HIGH (ref 4.0–10.5)
nRBC: 0 % (ref 0.0–0.2)

## 2023-09-06 LAB — COMPREHENSIVE METABOLIC PANEL
ALT: 9 U/L (ref 0–44)
AST: 13 U/L — ABNORMAL LOW (ref 15–41)
Albumin: 3.2 g/dL — ABNORMAL LOW (ref 3.5–5.0)
Alkaline Phosphatase: 58 U/L (ref 38–126)
Anion gap: 8 (ref 5–15)
BUN: 43 mg/dL — ABNORMAL HIGH (ref 8–23)
CO2: 20 mmol/L — ABNORMAL LOW (ref 22–32)
Calcium: 9.5 mg/dL (ref 8.9–10.3)
Chloride: 113 mmol/L — ABNORMAL HIGH (ref 98–111)
Creatinine, Ser: 1.23 mg/dL (ref 0.61–1.24)
GFR, Estimated: 58 mL/min — ABNORMAL LOW (ref >60)
Glucose, Bld: 102 mg/dL — ABNORMAL HIGH (ref 70–99)
Potassium: 3.8 mmol/L (ref 3.5–5.1)
Sodium: 141 mmol/L (ref 135–145)
Total Bilirubin: 0.4 mg/dL (ref 0.3–1.2)
Total Protein: 6.1 g/dL — ABNORMAL LOW (ref 6.5–8.1)

## 2023-09-06 LAB — BLOOD GAS, VENOUS
Acid-base deficit: 5.9 mmol/L — ABNORMAL HIGH (ref 0.0–2.0)
Bicarbonate: 20.2 mmol/L (ref 20.0–28.0)
O2 Saturation: 39.4 %
Patient temperature: 37
pCO2, Ven: 41 mm[Hg] — ABNORMAL LOW (ref 44–60)
pH, Ven: 7.3 (ref 7.25–7.43)

## 2023-09-06 LAB — LACTIC ACID, PLASMA
Lactic Acid, Venous: 2.2 mmol/L (ref 0.5–1.9)
Lactic Acid, Venous: 2.3 mmol/L (ref 0.5–1.9)

## 2023-09-06 MED ORDER — SODIUM CHLORIDE 0.9 % IV SOLN: INTRAVENOUS | Status: AC

## 2023-09-06 MED ORDER — SODIUM CHLORIDE 0.9 % IV SOLN
1.0000 g | INTRAVENOUS | Status: AC
  Administered 2023-09-06 – 2023-09-10 (×5): 1 g via INTRAVENOUS
  Filled 2023-09-06: qty 1000
  Filled 2023-09-06 (×3): qty 1
  Filled 2023-09-06: qty 1000

## 2023-09-06 NOTE — Progress Notes (Signed)
Progress Note   Patient: Richard Carter ZOX:[id number] DOB: 1941/02/17 DOA: 09/04/2023     2 DOS: the patient was seen and examined on 09/06/2023   Brief hospital course: 82 y.o. male with medical history significant for CVA with residual dysphagia on a pured and honey thick diet, carotid artery stenosis s/p left CEA, seizure disorder, dementia, A-fib on Eliquis, HTN, HLD,  COPD, tobacco use disorder,  with last hospitalization 8/16 to 07/18/2023 with acute encephalopathy attributed to ESBL E. coli UTI treated with ertapenem as well as possible seizure, who presents for evaluation of shortness of blood with possible hemoptysis and a strong odor to his urine.  History is given by POA Jackalyn Lombard over the phone who stated that patient was in his usual state of health until today when he noted that patient was coughing much more than his usual and was bringing up blood-streaked phlegm.  He also appeared very short of breath he denied chest pain, abdominal pain and had no vomiting, abdominal pain or diarrhea.  Also had no fever or chills.  He also noted that since the day prior patient has had a very strong odor to his urine and he is concerned about a UTI.  At baseline patient maintains independence in ADLs.  His POA is his full-time caregiver and he has home health coming out 3 times a week.  ED course and data review: Mildly tachycardic at 107 with BP 103/56.  Afebrile.  Respiratory rate in the intermittently up to 24, O2 sat maintained in the mid 90s. Labs: Notable for WBC 16,000 With lactic acid 2.3-2.2.  Respiratory viral panel negative for COVID flu.  VBG unremarkable. Hemoglobin at baseline at 9.8 BMP unremarkable except for bicarb of 19 Lipase and LFTs normal as well as ammonia Urinalysis with large leukocytes microscopic hematuria and cloudy urine. EKG, personally viewed and interpreted sinus tach.  Specific ST-T wave changes. CT head nonacute and showed chronic ischemic injury  10/12 : Urine  positive for infection.  Culture and sensitivities pending.  Patient had elevation in white count with persistent lactic acidosis.  Start the patient on ertapenem for covering ESBL.  Patient's past cultures has been multidrug-resistant.  Assessment and Plan:  Sepsis  / Possible SIRS, suspect sepsis / Pneumonia versus UTI :  Patient with cough, foul-smelling urine, leukocytosis, lactic acidosis, soft BP and tachycardia Patient has history of both aspiration pneumonia as well as ESBL E. coli UTI Possible pneumonia versus UTI Sepsis fluids lactate still elevated at 2.2.  Start the patient on IV fluids at 100 cc/h.  14 hours.  Acute cystitis / History of ESBL E. coli UTI 06/2023 : Persistent lactic acidosis with now leukocytosis, has history of ESBL E. coli.  Start the patient on ertapenem.  POA reports 2 days of foul-smelling urine CT abdomen and pelvis showing circumferential bladder wall thickening which may represent cystitis As patient has prior history of ESBL start the patient on ertapenem 1 g daily.   Follow cultures for sensitivities  HCAP (healthcare-associated pneumonia) / Dysphagia as late effect of CVA with prior history of aspiration pneumonia   Patient was noted to be coughing excessively on the day of arrival with blood-tinged sputum At baseline is on soft pured diet with honey thick liquids with which she is compliant per POA Will keep n.p.o. Aspiration precautions Rocephin, azithromycin and vancomycin-we discontinued  Speech therapy evaluation  Esophagitis  Patient with coughing and on dysphagia diet CT abdomen and pelvis shows wall thickening of the  mid and distal esophagus which can be seen with reflux or esophagitis Protonix IV No recent EGD noted on record Aspiration precautions  AF (paroxysmal atrial fibrillation)  Continue Eliquis Not currently on rate control agents per review of current med list  HTN (hypertension) Holding antihypertensives due to soft  blood pressure in the setting of sepsis  History of stroke / Left carotid artery stenosis s/p endarterectomy Continue apixaban and atorvastatin  Seizure disorder :  Continue Keppra 500 mg twice daily  Dementia :  Continue trazodone at bedtime Delirium precautions      Subjective: Pt seen and examined. No overnight events.   Physical Exam: Vitals:   09/05/23 2013 09/05/23 2022 09/06/23 0358 09/06/23 0944  BP: (!) 111/45  (!) 118/57 (!) 115/57  Pulse: 67  (!) 52 95  Resp: 18  18 18   Temp: 98.1 F (36.7 C)   (!) 97.5 F (36.4 C)  TempSrc: Oral   Oral  SpO2: 100% 98% 100% 100%  Weight:      Height:       Physical Exam Constitutional:      Comments: Ill appearing frail   HENT:     Head: Normocephalic and atraumatic.  Eyes:     Pupils: Pupils are equal, round, and reactive to light.  Cardiovascular:     Rate and Rhythm: Normal rate.  Pulmonary:     Comments: Basal fine crackles Abdominal:     Palpations: Abdomen is soft.  Musculoskeletal:        General: Normal range of motion.  Skin:    General: Skin is dry.  Neurological:     General: No focal deficit present.  Psychiatric:        Mood and Affect: Mood normal.     Data Reviewed:  There are no new results to review at this time.  Family Communication: Number bedside  Disposition: Status is: Inpatient Remains inpatient appropriate because: Sepsis  Planned Discharge Destination: Home    Time spent: 35 minutes  Author: Kirstie Peri, MD 09/06/2023 1:54 PM  For on call review [email or web address].

## 2023-09-06 NOTE — Plan of Care (Signed)
  Problem: Respiratory: Goal: Ability to maintain adequate ventilation will improve Outcome: Progressing   Problem: Nutrition: Goal: Risk of aspiration will decrease Outcome: Progressing   Problem: Skin Integrity: Goal: Risk for impaired skin integrity will decrease Outcome: Progressing

## 2023-09-06 NOTE — Progress Notes (Signed)
Speech Language Pathology Treatment: Dysphagia  Patient Details Name: Richard Carter MRN: [id number] DOB: 1941-01-16 Today's Date: 09/06/2023 Time: 7829-5621 SLP Time Calculation (min) (ACUTE ONLY): 45 min  Assessment / Plan / Recommendation Clinical Impression  Pt seen for f/u w/ ongoing assessment of his swallowing and toleration of diet at bedside.  Per MBSS performed 07/17/2023 at [facility]: "Pateint presents with a moderate oropharyngeal dysphagia. Orally, patient with lingual pumping across consistencies and mastication of solid boluses beyond puree is incomplete resulting in larger peices of boluses falling over the base of the tongue and into the airway prior to and during the swallow. Additionally, combination of delay in swallow initiation and decreased laryngeal closure results in relatively consistent aspiration of liquids thinner than honey thick. Unfortunately use of tsp for liquids unsuccessful to consistently protect the airway. Use of chin tuck only consistent with nectar thick liquids although note that patient does not always use consistently and will requirei max assistance for this during po intake. Airway was fully protected with both pureed solids and honey thick liquids during today's study. At this time, will downgrade diet accordingly. Concern that use of honey thick liquids long term could contribute to dehydration.".   Patient appears to present with what appears to be Moderate+, chronic oropharyngeal dysphagia with contributing factors of cognitive deficits(Dementia at baseline; CVAs; ETOH abuse history), generalized weakness, sensorimotor deficits, and structural age-related changes. He has KNOWN Aspiration of thin liquids, soft solids. Pt is only wearing an Upper Denture plate; missing lower dentition. He has KNOWN Esophageal phase Dysmotility w/ a Mod-Large Hiatal Hernia -- this can significantly increase risk for REGURGITATION of po's post intake thus increase risk  for Aspiration of REFLUX material which can then lead to Pulmonary decline.    Pt supported during his Breakfast meal of purees, Honey consistency liquids. He appeared to adequately swallow all po's of Honey consistency liquids and Purees w/ no immediate, overt clinical s/s of aspiration noted at bedside; no decline in respiratory status and clear vocal quality b/t trials when assessed. Pt exhibited a mild cough post several trials w/in the meal -- unsure if related to potential residue. Oral phase was functional for bolus management of trial consistencies.  Pt appears close to/at his Baseline w/ swallowing. He is followed by [facility] services since [facility] 07/17/2023.   In setting of pt's BASELINE and KNOWN Dysphagia and RISK FOR ASPIRATION, recommend continue the Dysphagia level 1 (puree) w/ HONEY consistency liquids via cup; aspiration precautions; Supervision w/ meals for follow through w/ precautions. Oral care prior to/post meals. Dietician following. Precautions posted.   Recommend Palliative Care consult for discussion of GOC moving forward re: pt's oropharyngeal-pharyngoesophageal phase Dysphagia and the impact of a Dysphagia diet as well as Dehydration issues, overall oral intake, and Pleasure/QOL.   The above was discussed w/ NSG and MD; TOC.  No further Acute ST services indicated; pt to continue his therapy at home w/ HHST services (per TOC) post D/C.        HPI HPI: Pt is a 82 y.o. male with medical history significant for MULTIPLE CVAs per Imaging with residual oropharyngeal phase dysphagia(on a pured and honey consistency liquids diet), Dementia, carotid artery stenosis s/p left CEA, seizure disorder, A-fib on Eliquis, HTN, HLD,  COPD, tobacco use disorder, who last had a hospitalization 07/11/23-07/18/2023 with acute Encephalopathy attributed to ESBL E. coli UTI which was treated with ertapenem as well as possible seizure.  He presents this admit on 09/04/23 for evaluation of shortness  of  breath with possible hemoptysis post coughing and a strong odor to his urine.  HCPOA Richard Carter gave information to ED MD over the phone and stated that patient was in his usual state of health until he noted that patient was coughing much more than his usual and was bringing up blood-streaked phlegm.  He also appeared very short of breath he denied chest pain, abdominal pain and had no vomiting, abdominal pain or diarrhea.  Also had no fever or chills.  He also noted that since the day prior patient has had a very strong odor to his urine and he is concerned about a UTI again.  The pt has a full-time caregiver, Richard Carter, and he has home health coming out 3 times a week.    Per MRI 06/2023: "No acute infarction, hemorrhage, hydrocephalus, extra-axial  collection or mass lesion. Chronic superior left frontal infarct  which is moderate to large. Moderate chronic right parietal cortex  infarct. Small chronic right occipital cortex infarct. Ischemic  gliosis in the cerebral white matter. Chronic lacunar infarct in the  right paramedian pons. Pronounced brain atrophy.".  Per Imaging this admit: "Mild bronchial thickening. Ground-glass opacities in the  dependent right lower lobe may be hypoventilatory, non but  nonspecific. Infection is not entirely excluded.  3. Moderate-sized hiatal hernia. Wall thickening of the mid and  distal esophagus, can be seen with reflux or esophagitis.".  Previous Imaging: pt as a"has a "moderate to large volume Hiatal Hernia containing the majority of the of the stomach".      SLP Plan  All goals met (pt is appears at his Baseline)      Recommendations for follow up therapy are one component of a multi-disciplinary discharge planning process, led by the attending physician.  Recommendations may be updated based on patient status, additional functional criteria and insurance authorization.    Recommendations  Diet recommendations: Dysphagia 1 (puree);Honey-thick liquid Liquids  provided via: Cup;No straw Medication Administration: Crushed with puree Supervision: Patient able to self feed;Staff to assist with self feeding;Intermittent supervision to cue for compensatory strategies Compensations: Minimize environmental distractions;Slow rate;Small sips/bites;Lingual sweep for clearance of pocketing;Multiple dry swallows after each bite/sip;Follow solids with liquid Postural Changes and/or Swallow Maneuvers: Out of bed for meals;Seated upright 90 degrees;Upright 30-60 min after meal                 (Palliative Care f/u for GOC; Dietician) Oral care BID;Staff/trained caregiver to provide oral care (Denture care)   Frequent or constant Supervision/Assistance Dysphagia, oropharyngeal phase (R13.12);Dysphagia, pharyngoesophageal phase (R13.14) (Esophageal phase Dysmotility w/ risk for aspiration of REFLUX material; Dementia; CVAs)     All goals met (pt is appears at his Baseline)       Jerilynn Som, MS, CCC-SLP Speech Language Pathologist Rehab Services; [facility] -  [phone] (ascom) Demareon Coldwell  09/06/2023, 11:45 AM

## 2023-09-07 DIAGNOSIS — A419 Sepsis, unspecified organism: Secondary | ICD-10-CM | POA: Diagnosis not present

## 2023-09-07 LAB — COMPREHENSIVE METABOLIC PANEL
ALT: 9 U/L (ref 0–44)
AST: 12 U/L — ABNORMAL LOW (ref 15–41)
Albumin: 3 g/dL — ABNORMAL LOW (ref 3.5–5.0)
Alkaline Phosphatase: 59 U/L (ref 38–126)
Anion gap: 9 (ref 5–15)
BUN: 38 mg/dL — ABNORMAL HIGH (ref 8–23)
CO2: 20 mmol/L — ABNORMAL LOW (ref 22–32)
Calcium: 9 mg/dL (ref 8.9–10.3)
Chloride: 112 mmol/L — ABNORMAL HIGH (ref 98–111)
Creatinine, Ser: 1.09 mg/dL (ref 0.61–1.24)
GFR, Estimated: >60 mL/min (ref >60)
Glucose, Bld: 98 mg/dL (ref 70–99)
Potassium: 3.7 mmol/L (ref 3.5–5.1)
Sodium: 141 mmol/L (ref 135–145)
Total Bilirubin: 0.4 mg/dL (ref 0.3–1.2)
Total Protein: 6 g/dL — ABNORMAL LOW (ref 6.5–8.1)

## 2023-09-07 LAB — CBC
HCT: 23.1 % — ABNORMAL LOW (ref 39.0–52.0)
Hemoglobin: 7.3 g/dL — ABNORMAL LOW (ref 13.0–17.0)
MCH: 29.3 pg (ref 26.0–34.0)
MCHC: 31.6 g/dL (ref 30.0–36.0)
MCV: 92.8 fL (ref 80.0–100.0)
Platelets: 256 10*3/uL (ref 150–400)
RBC: 2.49 MIL/uL — ABNORMAL LOW (ref 4.22–5.81)
RDW: 16.4 % — ABNORMAL HIGH (ref 11.5–15.5)
WBC: 7.3 10*3/uL (ref 4.0–10.5)
nRBC: 0 % (ref 0.0–0.2)

## 2023-09-07 MED ORDER — SODIUM CHLORIDE 0.9 % IV SOLN: INTRAVENOUS | Status: AC

## 2023-09-07 NOTE — Plan of Care (Signed)
Problem: Fluid Volume: Goal: Hemodynamic stability will improve Outcome: Progressing   Problem: Clinical Measurements: Goal: Diagnostic test results will improve Outcome: Progressing Goal: Signs and symptoms of infection will decrease Outcome: Progressing   Problem: Respiratory: Goal: Ability to maintain adequate ventilation will improve Outcome: Progressing   Problem: Education: Goal: Knowledge of disease or condition will improve Outcome: Progressing Goal: Knowledge of secondary prevention will improve (MUST DOCUMENT ALL) Outcome: Progressing Goal: Knowledge of patient specific risk factors will improve Loraine Leriche N/A or DELETE if not current risk factor) Outcome: Progressing   Problem: Ischemic Stroke/TIA Tissue Perfusion: Goal: Complications of ischemic stroke/TIA will be minimized Outcome: Progressing   Problem: Coping: Goal: Will verbalize positive feelings about self Outcome: Progressing Goal: Will identify appropriate support needs Outcome: Progressing   Problem: Health Behavior/Discharge Planning: Goal: Ability to manage health-related needs will improve Outcome: Progressing Goal: Goals will be collaboratively established with patient/family Outcome: Progressing   Problem: Self-Care: Goal: Ability to participate in self-care as condition permits will improve Outcome: Progressing Goal: Verbalization of feelings and concerns over difficulty with self-care will improve Outcome: Progressing Goal: Ability to communicate needs accurately will improve Outcome: Progressing   Problem: Nutrition: Goal: Risk of aspiration will decrease Outcome: Progressing Goal: Dietary intake will improve Outcome: Progressing   Problem: Education: Goal: Knowledge of General Education information will improve Description: Including pain rating scale, medication(s)/side effects and non-pharmacologic comfort measures Outcome: Progressing   Problem: Health Behavior/Discharge  Planning: Goal: Ability to manage health-related needs will improve Outcome: Progressing   Problem: Clinical Measurements: Goal: Ability to maintain clinical measurements within normal limits will improve Outcome: Progressing Goal: Will remain free from infection Outcome: Progressing Goal: Diagnostic test results will improve Outcome: Progressing Goal: Respiratory complications will improve Outcome: Progressing Goal: Cardiovascular complication will be avoided Outcome: Progressing   Problem: Activity: Goal: Risk for activity intolerance will decrease Outcome: Progressing   Problem: Nutrition: Goal: Adequate nutrition will be maintained Outcome: Progressing   Problem: Coping: Goal: Level of anxiety will decrease Outcome: Progressing   Problem: Elimination: Goal: Will not experience complications related to bowel motility Outcome: Progressing Goal: Will not experience complications related to urinary retention Outcome: Progressing   Problem: Pain Managment: Goal: General experience of comfort will improve Outcome: Progressing   Problem: Safety: Goal: Ability to remain free from injury will improve Outcome: Progressing   Problem: Skin Integrity: Goal: Risk for impaired skin integrity will decrease Outcome: Progressing

## 2023-09-07 NOTE — Progress Notes (Signed)
MD aware and discussed patient not being able to take PO medication due to decrease alertness and high aspiration risk MD advised to proceed with IV interventions for medications at this time.

## 2023-09-07 NOTE — Progress Notes (Signed)
Progress Note   Patient: Richard Carter [id number]:[id number] DOB: 08/02/1941 DOA: 09/04/2023     3 DOS: the patient was seen and examined on 09/07/2023   Brief hospital course: 82 y.o. male with medical history significant for CVA with residual dysphagia on a pured and honey thick diet, carotid artery stenosis s/p left CEA, seizure disorder, dementia, A-fib on Eliquis, HTN, HLD,  COPD, tobacco use disorder,  with last hospitalization 8/16 to 07/18/2023 with acute encephalopathy attributed to ESBL E. coli UTI treated with ertapenem as well as possible seizure, who presents for evaluation of shortness of blood with possible hemoptysis and a strong odor to his urine.  History is given by POA Jackalyn Lombard over the phone who stated that patient was in his usual state of health until today when he noted that patient was coughing much more than his usual and was bringing up blood-streaked phlegm.  He also appeared very short of breath he denied chest pain, abdominal pain and had no vomiting, abdominal pain or diarrhea.  Also had no fever or chills.  He also noted that since the day prior patient has had a very strong odor to his urine and he is concerned about a UTI.  At baseline patient maintains independence in ADLs.  His POA is his full-time caregiver and he has home health coming out 3 times a week.  ED course and data review: Mildly tachycardic at 107 with BP 103/56.  Afebrile.  Respiratory rate in the intermittently up to 24, O2 sat maintained in the mid 90s. Labs: Notable for WBC 16,000 With lactic acid 2.3-2.2.  Respiratory viral panel negative for COVID flu.  VBG unremarkable. Hemoglobin at baseline at 9.8 BMP unremarkable except for bicarb of 19 Lipase and LFTs normal as well as ammonia Urinalysis with large leukocytes microscopic hematuria and cloudy urine. EKG, personally viewed and interpreted sinus tach.  Specific ST-T wave changes. CT head nonacute and showed chronic ischemic injury  10/12 : Urine  positive for infection.  Culture and sensitivities pending.  Patient had elevation in white count with persistent lactic acidosis.  Start the patient on ertapenem for covering ESBL.  Patient's past cultures has been multidrug-resistant.  10/13 : Continues to be lethargic, discontinue trazodone at night which he has been getting nightly.  Urinalysis was positive however no cultures to follow on.  Will treat with ertapenem for total of 5 days as patient had responded to antibiotics.  Improving leukocytosis and no worsening lactic acid.  As procalcitonin was normal less likely to be URI/pneumonia  Assessment and Plan:   Acute cystitis / History of ESBL E. coli UTI 06/2023 : Persistent lactic acidosis with now leukocytosis, has history of ESBL E. coli.  Start the patient on ertapenem.  POA reports 2 days of foul-smelling urine CT abdomen and pelvis showing circumferential bladder wall thickening which may represent cystitis As patient has prior history of ESBL start the patient on ertapenem 1 g daily.   Follow cultures for sensitivities  Sepsis  / Possible SIRS, suspect sepsis / Pneumonia versus UTI : Workup more inclined towards UTI  Patient has history of both aspiration pneumonia as well as ESBL E. coli UTI Possible pneumonia versus UTI Sepsis fluids lactate still elevated at 2.2.  Start the patient on IV fluids at 100 cc/h.  14 hours.  HCAP (healthcare-associated pneumonia) / Dysphagia as late effect of CVA with prior history of aspiration pneumonia  -  Patient was noted to be coughing excessively on the day  of arrival with blood-tinged sputum At baseline is on soft pured diet with honey thick liquids with which she is compliant per POA Will keep n.p.o. Aspiration precautions Rocephin, azithromycin and vancomycin-we discontinued  Speech therapy evaluation  Esophagitis  Patient with coughing and on dysphagia diet CT abdomen and pelvis shows wall thickening of the mid and distal esophagus  which can be seen with reflux or esophagitis Protonix IV No recent EGD noted on record Aspiration precautions  AF (paroxysmal atrial fibrillation)  Continue Eliquis Not currently on rate control agents per review of current med list  HTN (hypertension) Holding antihypertensives due to soft blood pressure in the setting of sepsis  History of stroke / Left carotid artery stenosis s/p endarterectomy Continue apixaban and atorvastatin  Seizure disorder :  Continue Keppra 500 mg twice daily  Dementia :  Continue trazodone at bedtime Delirium precautions      Subjective: Pt seen and examined. No overnight events.  Seems lethargic in the morning able to participate in the interview process but slow to respond.  Poor p.o. intake as per nursing.  Secondary to dysphagia risk medications were held.  Physical Exam: Vitals:   09/06/23 2031 09/07/23 0401 09/07/23 0729 09/07/23 0737  BP: 118/60 123/62  125/60  Pulse: 89 86  (!) 56  Resp:  18  18  Temp: 97.9 F (36.6 C) 98 F (36.7 C)  97.7 F (36.5 C)  TempSrc: Oral Oral    SpO2: 96% 97% 98% 100%  Weight:      Height:       Physical Exam Constitutional:      Comments: Ill appearing frail   HENT:     Head: Normocephalic and atraumatic.  Eyes:     Pupils: Pupils are equal, round, and reactive to light.  Cardiovascular:     Rate and Rhythm: Normal rate.  Pulmonary:     Comments: Basal fine crackles Abdominal:     Palpations: Abdomen is soft.  Musculoskeletal:        General: Normal range of motion.  Skin:    General: Skin is dry.  Neurological:     General: No focal deficit present.  Psychiatric:        Mood and Affect: Mood normal.     Data Reviewed:  There are no new results to review at this time.  Family Communication: Number bedside  Disposition: Status is: Inpatient Remains inpatient appropriate because: Sepsis  Planned Discharge Destination: Home    Time spent: 35 minutes  Author: Kirstie Peri,  MD 09/07/2023 1:58 PM  For on call review [email or web address].

## 2023-09-08 DIAGNOSIS — J189 Pneumonia, unspecified organism: Secondary | ICD-10-CM

## 2023-09-08 DIAGNOSIS — N3090 Cystitis, unspecified without hematuria: Secondary | ICD-10-CM | POA: Diagnosis not present

## 2023-09-08 DIAGNOSIS — A419 Sepsis, unspecified organism: Secondary | ICD-10-CM | POA: Diagnosis not present

## 2023-09-08 LAB — COMPREHENSIVE METABOLIC PANEL
ALT: 10 U/L (ref 0–44)
AST: 13 U/L — ABNORMAL LOW (ref 15–41)
Albumin: 2.9 g/dL — ABNORMAL LOW (ref 3.5–5.0)
Alkaline Phosphatase: 59 U/L (ref 38–126)
Anion gap: 6 (ref 5–15)
BUN: 26 mg/dL — ABNORMAL HIGH (ref 8–23)
CO2: 23 mmol/L (ref 22–32)
Calcium: 9.1 mg/dL (ref 8.9–10.3)
Chloride: 113 mmol/L — ABNORMAL HIGH (ref 98–111)
Creatinine, Ser: 0.92 mg/dL (ref 0.61–1.24)
GFR, Estimated: >60 mL/min (ref >60)
Glucose, Bld: 81 mg/dL (ref 70–99)
Potassium: 3.9 mmol/L (ref 3.5–5.1)
Sodium: 142 mmol/L (ref 135–145)
Total Bilirubin: 0.3 mg/dL (ref 0.3–1.2)
Total Protein: 5.7 g/dL — ABNORMAL LOW (ref 6.5–8.1)

## 2023-09-08 LAB — CBC
HCT: 23.7 % — ABNORMAL LOW (ref 39.0–52.0)
Hemoglobin: 7.5 g/dL — ABNORMAL LOW (ref 13.0–17.0)
MCH: 29.3 pg (ref 26.0–34.0)
MCHC: 31.6 g/dL (ref 30.0–36.0)
MCV: 92.6 fL (ref 80.0–100.0)
Platelets: 248 10*3/uL (ref 150–400)
RBC: 2.56 MIL/uL — ABNORMAL LOW (ref 4.22–5.81)
RDW: 16.6 % — ABNORMAL HIGH (ref 11.5–15.5)
WBC: 7.4 10*3/uL (ref 4.0–10.5)
nRBC: 0 % (ref 0.0–0.2)

## 2023-09-08 NOTE — Progress Notes (Signed)
Progress Note   Patient: Richard Carter QIO:[id number] DOB: 08/20/1941 DOA: 09/04/2023     4 DOS: the patient was seen and examined on 09/08/2023      Subjective: Patient seen in his bedside this morning Appears lethargic Denies nausea vomiting chest pain abdominal pain He tells me he lives with his son at home  Brief hospital course: 82 y.o. male with medical history significant for CVA with residual dysphagia on a pured and honey thick diet, carotid artery stenosis s/p left CEA, seizure disorder, dementia, A-fib on Eliquis, HTN, HLD,  COPD, tobacco use disorder,  with last hospitalization 8/16 to 07/18/2023 with acute encephalopathy attributed to ESBL E. coli UTI treated with ertapenem as well as possible seizure, who presents for evaluation of shortness of blood with possible hemoptysis and a strong odor to his urine.  History is given by POA Jackalyn Lombard over the phone who stated that patient was in his usual state of health until today when he noted that patient was coughing much more than his usual and was bringing up blood-streaked phlegm.  He also appeared very short of breath he denied chest pain, abdominal pain and had no vomiting, abdominal pain or diarrhea.  Also had no fever or chills.  He also noted that since the day prior patient has had a very strong odor to his urine and he is concerned about a UTI.  At baseline patient maintains independence in ADLs.  His POA is his full-time caregiver and he has home health coming out 3 times a week.     Assessment and Plan: Acute cystitis / History of ESBL E. coli UTI 06/2023 : Persistent lactic acidosis with now leukocytosis, has history of ESBL E. coli.  Continue ertapenem to complete 5 days coursePOA reports 2 days of foul-smelling urine CT abdomen and pelvis showing circumferential bladder wall thickening which may represent cystitis Patient has prior history of ESBL Unfortunately urine cultures were not collected   Sepsis  /  Possible SIRS, suspect sepsis / Pneumonia versus UTI : Workup more inclined towards UTI Patient has history of both aspiration pneumonia as well as ESBL E. coli UTI Continue current antibiotics   HCAP (healthcare-associated pneumonia) / Dysphagia as late effect of CVA with prior history of aspiration pneumonia  -  Patient was noted to be coughing excessively on the day of arrival with blood-tinged sputum Continue pured diet with honey thickened liquid Continue aspiration precaution Rocephin, azithromycin and vancomycin-we discontinued  Speech therapist on board we appreciate input   Esophagitis  Patient with coughing and on dysphagia diet CT abdomen and pelvis shows wall thickening of the mid and distal esophagus which can be seen with reflux or esophagitis Continue Protonix No recent EGD noted on record Continue aspiration precaution   AF (paroxysmal atrial fibrillation)  Continue Eliquis Not currently on rate control agents per review of current med list   HTN (hypertension) Holding antihypertensives due to soft blood pressure in the setting of sepsis   History of stroke / Left carotid artery stenosis s/p endarterectomy Continue apixaban and atorvastatin   Seizure disorder :  Continue Keppra 500 mg twice daily   Dementia :  Continue trazodone at bedtime Continue delirium precaution    Physical Exam Constitutional: Elderly male chronically ill-appearing HENT:     Head: Normocephalic and atraumatic.  Eyes:     Pupils: Pupils are equal, round, and reactive to light.  Cardiovascular:     Rate and Rhythm: Normal rate.  Pulmonary:  Comments: Basal fine crackles Abdominal:     Palpations: Abdomen is soft.  Musculoskeletal:        General: Normal range of motion.  Skin:    General: Skin is dry.  Neurological:     General: No focal deficit present.  Psychiatric:        Mood and Affect: Mood normal  Data Reviewed: I have reviewed patient's vitals including labs  CBC and BMP, nursing documentation, speech therapist documentation as well as previous hospitalist documentation   Family Communication: No family at bedside today  Disposition: Status is: Inpatient   Planned Discharge Destination: Home health when medically stable   Time spent: 40 minutes     Latest Ref Rng & Units 09/08/2023    4:00 AM 09/07/2023    3:38 AM 09/06/2023    4:07 AM  CBC  WBC 4.0 - 10.5 K/uL 7.4  7.3  10.9   Hemoglobin 13.0 - 17.0 g/dL 7.5  7.3  7.4   Hematocrit 39.0 - 52.0 % 23.7  23.1  22.5   Platelets 150 - 400 K/uL 248  256  250        Latest Ref Rng & Units 09/08/2023    4:00 AM 09/07/2023    3:38 AM 09/06/2023    4:07 AM  BMP  Glucose 70 - 99 mg/dL 81  98  086   BUN 8 - 23 mg/dL 26  38  43   Creatinine 0.61 - 1.24 mg/dL 5.78  4.69  6.29   Sodium 135 - 145 mmol/L 142  141  141   Potassium 3.5 - 5.1 mmol/L 3.9  3.7  3.8   Chloride 98 - 111 mmol/L 113  112  113   CO2 22 - 32 mmol/L 23  20  20    Calcium 8.9 - 10.3 mg/dL 9.1  9.0  9.5         Vitals:   09/07/23 1955 09/07/23 2100 09/08/23 0402 09/08/23 1013  BP: 134/76  128/72 139/71  Pulse: 67  65 (!) 56  Resp: 17 13 17    Temp: 97.8 F (36.6 C)  97.8 F (36.6 C) 97.9 F (36.6 C)  TempSrc: Oral   Oral  SpO2: 98% 98% 97% 100%  Weight:      Height:         Author: Loyce Dys, MD 09/08/2023 4:22 PM  For on call review [email or web address].

## 2023-09-08 NOTE — Plan of Care (Signed)
  Problem: Fluid Volume: Goal: Hemodynamic stability will improve Outcome: Progressing   Problem: Clinical Measurements: Goal: Diagnostic test results will improve Outcome: Progressing Goal: Signs and symptoms of infection will decrease Outcome: Progressing   Problem: Respiratory: Goal: Ability to maintain adequate ventilation will improve Outcome: Progressing   Problem: Education: Goal: Knowledge of disease or condition will improve Outcome: Progressing Goal: Knowledge of secondary prevention will improve (MUST DOCUMENT ALL) Outcome: Progressing Goal: Knowledge of patient specific risk factors will improve (Mark N/A or DELETE if not current risk factor) Outcome: Progressing   Problem: Ischemic Stroke/TIA Tissue Perfusion: Goal: Complications of ischemic stroke/TIA will be minimized Outcome: Progressing   Problem: Coping: Goal: Will verbalize positive feelings about self Outcome: Progressing Goal: Will identify appropriate support needs Outcome: Progressing   Problem: Health Behavior/Discharge Planning: Goal: Ability to manage health-related needs will improve Outcome: Progressing Goal: Goals will be collaboratively established with patient/family Outcome: Progressing   Problem: Self-Care: Goal: Ability to participate in self-care as condition permits will improve Outcome: Progressing Goal: Verbalization of feelings and concerns over difficulty with self-care will improve Outcome: Progressing Goal: Ability to communicate needs accurately will improve Outcome: Progressing   Problem: Nutrition: Goal: Risk of aspiration will decrease Outcome: Progressing Goal: Dietary intake will improve Outcome: Progressing   Problem: Education: Goal: Knowledge of General Education information will improve Description: Including pain rating scale, medication(s)/side effects and non-pharmacologic comfort measures Outcome: Progressing   Problem: Health Behavior/Discharge  Planning: Goal: Ability to manage health-related needs will improve Outcome: Progressing   Problem: Clinical Measurements: Goal: Ability to maintain clinical measurements within normal limits will improve Outcome: Progressing Goal: Will remain free from infection Outcome: Progressing Goal: Diagnostic test results will improve Outcome: Progressing Goal: Respiratory complications will improve Outcome: Progressing Goal: Cardiovascular complication will be avoided Outcome: Progressing   Problem: Activity: Goal: Risk for activity intolerance will decrease Outcome: Progressing   Problem: Nutrition: Goal: Adequate nutrition will be maintained Outcome: Progressing   Problem: Coping: Goal: Level of anxiety will decrease Outcome: Progressing   Problem: Elimination: Goal: Will not experience complications related to bowel motility Outcome: Progressing Goal: Will not experience complications related to urinary retention Outcome: Progressing   Problem: Pain Managment: Goal: General experience of comfort will improve Outcome: Progressing   Problem: Safety: Goal: Ability to remain free from injury will improve Outcome: Progressing   Problem: Skin Integrity: Goal: Risk for impaired skin integrity will decrease Outcome: Progressing   

## 2023-09-09 DIAGNOSIS — N3090 Cystitis, unspecified without hematuria: Secondary | ICD-10-CM | POA: Diagnosis not present

## 2023-09-09 DIAGNOSIS — A419 Sepsis, unspecified organism: Secondary | ICD-10-CM | POA: Diagnosis not present

## 2023-09-09 LAB — CBC WITH DIFFERENTIAL/PLATELET
Abs Immature Granulocytes: 0.07 10*3/uL (ref 0.00–0.07)
Basophils Absolute: 0.1 10*3/uL (ref 0.0–0.1)
Basophils Relative: 1 %
Eosinophils Absolute: 0.4 10*3/uL (ref 0.0–0.5)
Eosinophils Relative: 6 %
HCT: 23.3 % — ABNORMAL LOW (ref 39.0–52.0)
Hemoglobin: 7.3 g/dL — ABNORMAL LOW (ref 13.0–17.0)
Immature Granulocytes: 1 %
Lymphocytes Relative: 26 %
Lymphs Abs: 2 10*3/uL (ref 0.7–4.0)
MCH: 29 pg (ref 26.0–34.0)
MCHC: 31.3 g/dL (ref 30.0–36.0)
MCV: 92.5 fL (ref 80.0–100.0)
Monocytes Absolute: 0.7 10*3/uL (ref 0.1–1.0)
Monocytes Relative: 9 %
Neutro Abs: 4.3 10*3/uL (ref 1.7–7.7)
Neutrophils Relative %: 57 %
Platelets: 263 10*3/uL (ref 150–400)
RBC: 2.52 MIL/uL — ABNORMAL LOW (ref 4.22–5.81)
RDW: 16.5 % — ABNORMAL HIGH (ref 11.5–15.5)
WBC: 7.5 10*3/uL (ref 4.0–10.5)
nRBC: 0 % (ref 0.0–0.2)

## 2023-09-09 LAB — BASIC METABOLIC PANEL
Anion gap: 5 (ref 5–15)
BUN: 22 mg/dL (ref 8–23)
CO2: 22 mmol/L (ref 22–32)
Calcium: 8.9 mg/dL (ref 8.9–10.3)
Chloride: 111 mmol/L (ref 98–111)
Creatinine, Ser: 0.99 mg/dL (ref 0.61–1.24)
GFR, Estimated: >60 mL/min (ref >60)
Glucose, Bld: 85 mg/dL (ref 70–99)
Potassium: 3.8 mmol/L (ref 3.5–5.1)
Sodium: 138 mmol/L (ref 135–145)

## 2023-09-09 MED ORDER — ADULT MULTIVITAMIN W/MINERALS CH
1.0000 | ORAL_TABLET | Freq: Every day | ORAL | Status: DC
  Administered 2023-09-10: 1 via ORAL
  Filled 2023-09-09: qty 1

## 2023-09-09 NOTE — Progress Notes (Signed)
Occupational Therapy Treatment Patient Details Name: Richard Carter MRN: [id number] DOB: 03/01/1941 Today's Date: 09/09/2023   History of present illness Pt is an 82 y.o. male that presented to the ED for SOB, hemoptysis, strong odor to urine, workup for sepsis. Significant PMH includes: hx CVA, carotid endarterectomy (03/2023), Afib on Eliquis, HTN, hLD, mixed vascular and Alzheimer's dementia, seizure disorder, alcohol use disorder (in remission).   OT comments  Pt is seated in recliner on arrival. Feeding himself lunch, but asking for help. He denies pain. He had increased spillage noted requiring pt to pick up food with hands when he dropped it; may benefit from a divided plate and larger utensils for increased ease with self feeding. Cueing to take small sips of liquid and pace himself during feeding, but noted to couch x2 while feeding himself. Pt performed face washing with set up seated in recliner. He demo STS from recliner with CGA and ability to march in place x10 reps on each leg before requiring seated RB, then performed step pivot transfer from recliner to bed then bed to recliner with CGA. Pt left in recliner with all needs in place and will cont to require skilled acute OT services to maximize his safety and IND to return to PLOF.       If plan is discharge home, recommend the following:  Supervision due to cognitive status;A little help with walking and/or transfers;A little help with bathing/dressing/bathroom;Help with stairs or ramp for entrance   Equipment Recommendations  None recommended by OT    Recommendations for Other Services      Precautions / Restrictions Precautions Precautions: Fall Restrictions Weight Bearing Restrictions: No       Mobility Bed Mobility                    Transfers Overall transfer level: Needs assistance Equipment used: Rolling walker (2 wheels) Transfers: Sit to/from Stand, Bed to chair/wheelchair/BSC Sit to Stand:  Contact guard assist     Step pivot transfers: Contact guard assist     General transfer comment: STS from recliner with CGA and ability to march in place x10 reps on each leg. Took seated RB then performed step pivot transfer from recliner to bed then bed to recliner with CGA     Balance Overall balance assessment: Needs assistance Sitting-balance support: Feet supported Sitting balance-Leahy Scale: Fair     Standing balance support: Bilateral upper extremity supported, Reliant on assistive device for balance Standing balance-Leahy Scale: Fair                             ADL either performed or assessed with clinical judgement   ADL Overall ADL's : Needs assistance/impaired Eating/Feeding: Minimal assistance Eating/Feeding Details (indicate cue type and reason): increased spillage noted requiring pt to pick up food with hands when he dropped it; may benefit from a divided plate and larger utensils for increased ease with self feeding Grooming: Wash/dry face;Set up                   Toilet Transfer: Contact guard assist;Rolling walker (2 wheels) Toilet Transfer Details (indicate cue type and reason): simulated transfer via recliner<>EOB requiring CGA and RW           General ADL Comments: difficulty with self feeding d/t spillage and difficulty getting food on spoon and to mouth without spilling    Extremity/Trunk Assessment Upper Extremity Assessment Upper Extremity Assessment:  Generalized weakness   Lower Extremity Assessment Lower Extremity Assessment: Generalized weakness        Vision       Perception     Praxis      Cognition Arousal: Alert Behavior During Therapy: WFL for tasks assessed/performed Overall Cognitive Status: History of cognitive impairments - at baseline                                 General Comments: Pt orient to self and was willing to participate in session.        Exercises      Shoulder  Instructions       General Comments      Pertinent Vitals/ Pain       Pain Assessment Pain Assessment: No/denies pain  Home Living                                          Prior Functioning/Environment              Frequency  Min 1X/week        Progress Toward Goals  OT Goals(current goals can now be found in the care plan section)  Progress towards OT goals: Progressing toward goals  Acute Rehab OT Goals Patient Stated Goal: return home with [name] OT Goal Formulation: With patient Time For Goal Achievement: 09/19/23 Potential to Achieve Goals: Good  Plan      Co-evaluation                 AM-PAC OT "6 Clicks" Daily Activity     Outcome Measure   Help from another person eating meals?: A Little Help from another person taking care of personal grooming?: A Little Help from another person toileting, which includes using toliet, bedpan, or urinal?: A Little Help from another person bathing (including washing, rinsing, drying)?: A Little Help from another person to put on and taking off regular upper body clothing?: A Little Help from another person to put on and taking off regular lower body clothing?: A Little 6 Click Score: 18    End of Session Equipment Utilized During Treatment: Rolling walker (2 wheels)  OT Visit Diagnosis: Unsteadiness on feet (R26.81);Other abnormalities of gait and mobility (R26.89);Muscle weakness (generalized) (M62.81)   Activity Tolerance Patient tolerated treatment well   Patient Left in chair;with call bell/phone within reach;with chair alarm set;with nursing/sitter in room   Nurse Communication Mobility status        Time: 5784-6962 OT Time Calculation (min): 25 min  Charges: OT General Charges $OT Visit: 1 Visit OT Treatments $Self Care/Home Management : 8-22 mins $Therapeutic Activity: 8-22 mins  Sandy Blouch, OTR/L  09/09/23, 12:59 PM   Denesha Brouse E Ladeja Pelham 09/09/2023, 12:56 PM

## 2023-09-09 NOTE — Progress Notes (Signed)
Nutrition Follow-up  DOCUMENTATION CODES:   Severe malnutrition in context of chronic illness  INTERVENTION:   Honey Thick Mighty Shake po TID, each supplement provides 200kcal and 7g protein   Magic cup TID with meals, each supplement provides 290 kcal and 9 grams of protein  MVI po daily   Pt at high refeed risk; recommend monitor potassium, magnesium and phosphorus labs daily until stable  Daily weights   NUTRITION DIAGNOSIS:   Severe Malnutrition related to chronic illness (COPD, dementia) as evidenced by moderate fat depletion, severe fat depletion, moderate muscle depletion, severe muscle depletion, percent weight loss. -ongoing   GOAL:   Patient will meet greater than or equal to 90% of their needs -not met   MONITOR:   PO intake, Supplement acceptance, Labs, Weight trends, I & O's, Skin  ASSESSMENT:   82 y/o male with h/o stroke, seizures, HLD, HTN, dementia, PAF, GERD, etoh abuse, stroke with dysphagia and high-grade left carotid artery stenosis s/p left carotid endarterectomy 04/02/2023 who is admitted with HCAP, UTI, cystitis, sepsis and esophagitits.  Pt continues to have poor appetite and oral intake in hospital; pt eating 20-50% of most meals. SLP following; pt is on his baseline diet. Pt is receiving honey thick supplements on his meal trays. Pt remains at high refeed risk. Pt with poor oral intake at baseline; pt has refused all feeding tubes in the past. No new weight since admission; will request daily weights.   Medications reviewed and include: vitamin C, protonix, senokot, invanz  Labs reviewed: K 3.8 wnl Hgb 7.3(L), Hct 23.3(L)  Diet Order:   Diet Order             DIET - DYS 1 Room service appropriate? No; Fluid consistency: Honey Thick  Diet effective now                  EDUCATION NEEDS:   Education needs have been addressed  Skin:  Skin Assessment: Reviewed RN Assessment  Last BM:  10/14- type 4  Height:   Ht Readings from Last  1 Encounters:  09/04/23 5\' 2"  (1.575 m)    Weight:   Wt Readings from Last 1 Encounters:  09/04/23 61.2 kg    Ideal Body Weight:  50 kg  BMI:  Body mass index is 24.69 kg/m.  Estimated Nutritional Needs:   Kcal:  1600-1800kcal/day  Protein:  80-90g/day  Fluid:  1.4-1.6L/day  Betsey Holiday MS, RD, LDN Please refer to [facility] for RD and/or RD on-call/weekend/after hours pager

## 2023-09-09 NOTE — Plan of Care (Signed)
°  Problem: Fluid Volume: °Goal: Hemodynamic stability will improve °Outcome: Progressing °  °Problem: Clinical Measurements: °Goal: Diagnostic test results will improve °Outcome: Progressing °Goal: Signs and symptoms of infection will decrease °Outcome: Progressing °  °

## 2023-09-09 NOTE — Progress Notes (Signed)
Progress Note   Patient: Richard Carter WUJ:[id number] DOB: [birth year] DOA: 09/04/2023     5 DOS: the patient was seen and examined on 09/09/2023   Subjective: Patient seen and examined at bedside this morning Continues to appear lethargic Denies nausea vomiting abdominal pain or chest pain   Brief hospital course: 82 y.o. male with medical history significant for CVA with residual dysphagia on a pured and honey thick diet, carotid artery stenosis s/p left CEA, seizure disorder, dementia, A-fib on Eliquis, HTN, HLD,  COPD, tobacco use disorder,  with last hospitalization 8/16 to 07/18/2023 with acute encephalopathy attributed to ESBL E. coli UTI treated with ertapenem as well as possible seizure, who presents for evaluation of shortness of blood with possible hemoptysis and a strong odor to his urine.  History is given by POA Jackalyn Lombard over the phone who stated that patient was in his usual state of health until today when he noted that patient was coughing much more than his usual and was bringing up blood-streaked phlegm.  He also appeared very short of breath he denied chest pain, abdominal pain and had no vomiting, abdominal pain or diarrhea.  Also had no fever or chills.  He also noted that since the day prior patient has had a very strong odor to his urine and he is concerned about a UTI.  At baseline patient maintains independence in ADLs.  His POA is his full-time caregiver and he has home health coming out 3 times a week.       Assessment and Plan: Acute cystitis / History of ESBL E. coli UTI 06/2023 : Persistent lactic acidosis with now leukocytosis, has history of ESBL E. coli.  POA  CT abdomen and pelvis showing circumferential bladder wall thickening which may represent cystitis Patient has prior history of ESBL and was initiated on ertapenem but unfortunately no urine culture done Continue ertapenem to complete 5 days course on 09/10/2023 Hopefully discharge tomorrow after last  dose of antibiotics   Sepsis  / Possible SIRS, suspect sepsis / Pneumonia versus UTI : Workup more inclined towards UTI Patient has history of both aspiration pneumonia as well as ESBL E. coli UTI Continue current antibiotics   HCAP (healthcare-associated pneumonia) / Dysphagia as late effect of CVA with prior history of aspiration pneumonia  -  Patient was noted to be coughing excessively on the day of arrival with blood-tinged sputum Continue pured diet with honey thickened liquid Continue aspiration precaution Rocephin, azithromycin and vancomycin- discontinued  Speech therapist on board we appreciate input   Esophagitis  Patient with coughing and on dysphagia diet CT abdomen and pelvis shows wall thickening of the mid and distal esophagus which can be seen with reflux or esophagitis Continue Protonix No recent EGD noted on record Continue aspiration precaution   AF (paroxysmal atrial fibrillation)  Continue Eliquis Not currently on rate control agents per review of current med list   HTN (hypertension) Holding antihypertensives due to soft blood pressure in the setting of sepsis   History of stroke / Left carotid artery stenosis s/p endarterectomy Continue apixaban and atorvastatin   Seizure disorder :  Continue Keppra 500 mg twice daily   Dementia :  Continue trazodone at bedtime Continue delirium precaution     Physical Exam Constitutional: Elderly male chronically ill-appearing HENT:     Head: Normocephalic and atraumatic.  Eyes:     Pupils: Pupils are equal, round, and reactive to light.  Cardiovascular:     Rate and Rhythm:  Normal rate.  Pulmonary:     Comments: Basal fine crackles Abdominal:     Palpations: Abdomen is soft.  Musculoskeletal:        General: Normal range of motion.  Skin:    General: Skin is dry.  Neurological:     General: No focal deficit present.  Psychiatric:        Mood and Affect: Mood normal   Data Reviewed: I have reviewed  nursing documentation, as well as below mentioned labs     Family Communication: No family at bedside today   Disposition: Status is: Inpatient    Planned Discharge Destination: Home health when medically stable     Time spent: 40 minutes      Latest Ref Rng & Units 09/09/2023    4:55 AM 09/08/2023    4:00 AM 09/07/2023    3:38 AM  BMP  Glucose 70 - 99 mg/dL 85  81  98   BUN 8 - 23 mg/dL 22  26  38   Creatinine 0.61 - 1.24 mg/dL 1.61  0.96  0.45   Sodium 135 - 145 mmol/L 138  142  141   Potassium 3.5 - 5.1 mmol/L 3.8  3.9  3.7   Chloride 98 - 111 mmol/L 111  113  112   CO2 22 - 32 mmol/L 22  23  20    Calcium 8.9 - 10.3 mg/dL 8.9  9.1  9.0        Latest Ref Rng & Units 09/09/2023    4:55 AM 09/08/2023    4:00 AM 09/07/2023    3:38 AM  CBC  WBC 4.0 - 10.5 K/uL 7.5  7.4  7.3   Hemoglobin 13.0 - 17.0 g/dL 7.3  7.5  7.3   Hematocrit 39.0 - 52.0 % 23.3  23.7  23.1   Platelets 150 - 400 K/uL 263  248  256     Vitals:   09/08/23 2032 09/09/23 0400 09/09/23 0732 09/09/23 1428  BP:  132/62 (!) 139/59 116/63  Pulse:  (!) 52 (!) 50 (!) 58  Resp: 12 16 18 18   Temp:  98 F (36.7 C) 97.6 F (36.4 C) 97.7 F (36.5 C)  TempSrc:  Oral    SpO2: 96% 98% 99% 98%  Weight:      Height:        Author: Loyce Dys, MD 09/09/2023 5:23 PM  For on call review [email or web address].

## 2023-09-09 NOTE — Progress Notes (Signed)
Physical Therapy Treatment Patient Details Name: Richard Carter MRN: [id number] DOB: [birth year] Today's Date: 09/09/2023   History of Present Illness Pt is an 82 y.o. male that presented to the ED for SOB, hemoptysis, strong odor to urine, workup for sepsis. Significant PMH includes: hx CVA, carotid endarterectomy (03/2023), Afib on Eliquis, HTN, hLD, mixed vascular and Alzheimer's dementia, seizure disorder, alcohol use disorder (in remission).    PT Comments  Pt was received in bed and agreed to PT session. Pt performed bed mobility supine>sit with supervision in order to get to EOB. From EOB, with the use of RW (2wheels) pt STS with CGA. Pt stood for ~2 min prior to taking their first steps in order to prepare them for hallway amb while on contact precautions. After taking 5 steps fwd with RW MinA, pt needed a rest break so returned to sitting at EOB CGA. Pt then performed another STS CGA, to amb to recliner MinA to complete the session. Verbal and tactile cues necessary in order to ensure upright posture and appropriate/safe use of RW. Pt tolerated tx well today. Pt will continue to benefit from skilled PT sessions in order to increase endurance, activity tolerance, and improve functional mobility.    If plan is discharge home, recommend the following: Supervision due to cognitive status;A little help with walking and/or transfers;A little help with bathing/dressing/bathroom;Direct supervision/assist for medications management;Assist for transportation;Assistance with cooking/housework;Help with stairs or ramp for entrance   Can travel by private vehicle        Equipment Recommendations  None recommended by PT    Recommendations for Other Services       Precautions / Restrictions Precautions Precautions: Fall Restrictions Weight Bearing Restrictions: No     Mobility  Bed Mobility Overal bed mobility: Needs Assistance Bed Mobility: Supine to Sit     Supine to sit: Supervision,  Used rails, HOB elevated     General bed mobility comments: Pt performed bed mobility from supine to EOB with supervision.    Transfers Overall transfer level: Needs assistance Equipment used: Rolling walker (2 wheels) Transfers: Sit to/from Stand Sit to Stand: Contact guard assist           General transfer comment: Pt performed a total of 2 STS CGA. The 2nd STS was following the need for a rest break.    Ambulation/Gait Ambulation/Gait assistance: Min assist Gait Distance (Feet): 20 Feet Assistive device: Rolling walker (2 wheels) Gait Pattern/deviations: Step-through pattern Gait velocity: Decreased     General Gait Details: Pt performed amb MinA. Verbal and tactile cues necessary to ensure upright posturing as well as appropriate/safe use of RW (2wheels).   Stairs             Wheelchair Mobility     Tilt Bed    Modified Rankin (Stroke Patients Only)       Balance Overall balance assessment: Needs assistance Sitting-balance support: Feet supported Sitting balance-Leahy Scale: Fair     Standing balance support: Bilateral upper extremity supported, Reliant on assistive device for balance Standing balance-Leahy Scale: Fair                              Cognition Arousal: Alert Behavior During Therapy: WFL for tasks assessed/performed Overall Cognitive Status: History of cognitive impairments - at baseline  General Comments: Pt orient to self and was willing to participate in session.        Exercises      General Comments        Pertinent Vitals/Pain Pain Assessment Pain Assessment: No/denies pain Faces Pain Scale: No hurt Breathing: normal Negative Vocalization: none Facial Expression: smiling or inexpressive Body Language: relaxed Consolability: no need to console PAINAD Score: 0    Home Living                          Prior Function            PT Goals  (current goals can now be found in the care plan section) Acute Rehab PT Goals Patient Stated Goal: To go home PT Goal Formulation: Patient unable to participate in goal setting Time For Goal Achievement: 09/19/23 Potential to Achieve Goals: Good Progress towards PT goals: Progressing toward goals    Frequency    Min 1X/week      PT Plan      Co-evaluation              AM-PAC PT "6 Clicks" Mobility   Outcome Measure  Help needed turning from your back to your side while in a flat bed without using bedrails?: A Little Help needed moving from lying on your back to sitting on the side of a flat bed without using bedrails?: A Little Help needed moving to and from a bed to a chair (including a wheelchair)?: A Little Help needed standing up from a chair using your arms (e.g., wheelchair or bedside chair)?: A Little Help needed to walk in hospital room?: A Little Help needed climbing 3-5 steps with a railing? : A Lot 6 Click Score: 17    End of Session Equipment Utilized During Treatment: Gait belt Activity Tolerance: Patient tolerated treatment well Patient left: in chair;with call bell/phone within reach;with chair alarm set Nurse Communication: Mobility status PT Visit Diagnosis: Other abnormalities of gait and mobility (R26.89)     Time: 1610-9604 PT Time Calculation (min) (ACUTE ONLY): 22 min  Charges:                            [employer] SPT, LAT, ATC    Lynell Greenhouse Sauv[place]Howard 09/09/2023, 12:06 PM

## 2023-09-10 DIAGNOSIS — N3001 Acute cystitis with hematuria: Secondary | ICD-10-CM | POA: Diagnosis not present

## 2023-09-10 LAB — CBC WITH DIFFERENTIAL/PLATELET
Abs Immature Granulocytes: 0.12 10*3/uL — ABNORMAL HIGH (ref 0.00–0.07)
Basophils Absolute: 0.1 10*3/uL (ref 0.0–0.1)
Basophils Relative: 1 %
Eosinophils Absolute: 0.4 10*3/uL (ref 0.0–0.5)
Eosinophils Relative: 7 %
HCT: 24 % — ABNORMAL LOW (ref 39.0–52.0)
Hemoglobin: 7.8 g/dL — ABNORMAL LOW (ref 13.0–17.0)
Immature Granulocytes: 2 %
Lymphocytes Relative: 28 %
Lymphs Abs: 1.8 10*3/uL (ref 0.7–4.0)
MCH: 30 pg (ref 26.0–34.0)
MCHC: 32.5 g/dL (ref 30.0–36.0)
MCV: 92.3 fL (ref 80.0–100.0)
Monocytes Absolute: 0.7 10*3/uL (ref 0.1–1.0)
Monocytes Relative: 10 %
Neutro Abs: 3.5 10*3/uL (ref 1.7–7.7)
Neutrophils Relative %: 52 %
Platelets: 272 10*3/uL (ref 150–400)
RBC: 2.6 MIL/uL — ABNORMAL LOW (ref 4.22–5.81)
RDW: 16.8 % — ABNORMAL HIGH (ref 11.5–15.5)
WBC: 6.7 10*3/uL (ref 4.0–10.5)
nRBC: 0 % (ref 0.0–0.2)

## 2023-09-10 LAB — MAGNESIUM: Magnesium: 1.9 mg/dL (ref 1.7–2.4)

## 2023-09-10 LAB — CULTURE, BLOOD (ROUTINE X 2)
Culture  Setup Time: NO GROWTH
Culture: NO GROWTH
Special Requests: ADEQUATE
Special Requests: ADEQUATE

## 2023-09-10 LAB — BASIC METABOLIC PANEL
Anion gap: 5 (ref 5–15)
BUN: 23 mg/dL (ref 8–23)
CO2: 23 mmol/L (ref 22–32)
Calcium: 9.1 mg/dL (ref 8.9–10.3)
Chloride: 112 mmol/L — ABNORMAL HIGH (ref 98–111)
Creatinine, Ser: 1 mg/dL (ref 0.61–1.24)
GFR, Estimated: >60 mL/min (ref >60)
Glucose, Bld: 79 mg/dL (ref 70–99)
Potassium: 4.2 mmol/L (ref 3.5–5.1)
Sodium: 140 mmol/L (ref 135–145)

## 2023-09-10 LAB — PHOSPHORUS: Phosphorus: 3.2 mg/dL (ref 2.5–4.6)

## 2023-09-10 LAB — GLUCOSE, CAPILLARY: Glucose-Capillary: 80 mg/dL (ref 70–99)

## 2023-09-10 MED ORDER — COMPRESSOR/NEBULIZER MISC: 0 refills | Status: DC

## 2023-09-10 MED ORDER — FOOD THICKENER (SIMPLYTHICK HONEY): 1.0000 | ORAL | 0 refills | Status: DC | PRN

## 2023-09-10 MED ORDER — ALBUTEROL SULFATE (2.5 MG/3ML) 0.083% IN NEBU: 2.5000 mg | INHALATION_SOLUTION | Freq: Four times a day (QID) | RESPIRATORY_TRACT | 12 refills | Status: DC | PRN

## 2023-09-10 NOTE — Plan of Care (Signed)
°  Problem: Fluid Volume: °Goal: Hemodynamic stability will improve °Outcome: Progressing °  °Problem: Clinical Measurements: °Goal: Diagnostic test results will improve °Outcome: Progressing °Goal: Signs and symptoms of infection will decrease °Outcome: Progressing °  °

## 2023-09-10 NOTE — Consult Note (Addendum)
[facility] Customer service manager [facility]) Accountable Care Organization (ACO) [facility] Liaison Note  09/10/2023  KHAN CHURA 1941-10-18 [id number]  Location: [facility] RN Hospital Liaison screened the patient remotely at [facility].  Insurance: [facility] HMO   BARTT GONZAGA is a 82 y.o. male who is a Primary Care Patient of Marisue Ivan, MD [facility]. The patient was screened for readmission hospitalization with noted extreme risk score for unplanned readmission risk with 4 IP/1 ED in 6 months.  The patient was assessed for potential [facility]) Care Management service needs for post hospital transition for care coordination. Review of patient's electronic medical record reveals patient with sepsis. Pt's provider ([facility]) is listed to provide the post hospital follow up for CCM needs. Pt will discharged home today with [facility] for [facility] needs.  [facility] Care Management/Population Health does not replace or interfere with any arrangements made by the Inpatient Transition of Care team.   For questions contact:   Elliot Cousin, RN, [facility] Liaison [place]   Population Health Office Hours MTWF  8:00 am-6:00 pm [phone] mobile [phone] [Office toll free line] Office Hours are M-F 8:30 - 5 pm [email or web address]

## 2023-09-10 NOTE — TOC Transition Note (Signed)
Transition of Care [facility]) - CM/SW Discharge Note   Patient Details  Name: Richard Carter MRN: [id number] Date of Birth: Apr 11, 1941  Transition of Care [facility]) CM/SW Contact:  Chapman Fitch, RN Phone Number: 09/10/2023, 11:15 AM   Clinical Narrative:      Patient to discharge today POA [name] at bedside [place] with [facility] notified of discharge Nebulizer ordered for home. [name] states he does not have a preference of DME agency.  Referral made to jon with [employer]   Barriers to Discharge: Continued Medical Work up   Patient Goals and CMS Choice   Choice offered to / list presented to : [facility] POA / Guardian  Discharge Placement                         Discharge Plan and Services Additional resources added to the After Visit Summary for       Post Acute Care Choice: Resumption of Svcs/PTA Provider                    HH Arranged: PT, OT, Speech Therapy HH Agency: [facility] Date [facility] Agency Contacted: 09/05/23   Representative spoke with at [facility] Agency: Gearldine Bienenstock  Social Determinants of Health (SDOH) Interventions SDOH Screenings   Food Insecurity: No Food Insecurity (08/11/2023)   Received from [facility]  Housing: Patient Declined (07/12/2023)  Transportation Needs: No Transportation Needs (08/11/2023)   [facility] from [facility]  Utilities: Not At Risk (08/11/2023)   Received from [facility]  Financial Resource Strain: Low Risk  (08/11/2023)   Received [facility]  Tobacco Use: Medium Risk (08/11/2023)   Received from [facility]     Readmission Risk Interventions    09/05/2023    1:05 PM 04/21/2022    1:55 PM  Readmission Risk Prevention Plan  Post Dischage Appt  Complete  Medication Screening  Complete  Transportation Screening Complete Complete  Medication Review (RN Care Manager) Complete   PCP or Specialist appointment within 3-5 days of  discharge --   HRI or Home Care Consult Complete   SW Recovery Care/Counseling Consult Complete   Palliative Care Screening Not Applicable   Skilled Nursing Facility Not Applicable

## 2023-09-10 NOTE — Care Management Important Message (Signed)
Important Message  Patient Details  Name: Richard Carter MRN: [id number] Date of Birth: 01-Jan-1941   Important Message Given:  Yes - Medicare IM  I reviewed the Important Message from Medicare with the patient's POA, Renaldo Reel and he said they were in agreement with today's discharge and I thanked him for his time.   Olegario Messier A Amenda Duclos 09/10/2023, 11:06 AM

## 2023-09-10 NOTE — Plan of Care (Signed)
IV removed, discharge instructions reviewed with caretaker and POA [name], nebulizer delivered to room and patient discharged to home

## 2023-09-10 NOTE — Hospital Course (Signed)
HPI: [facility] course / significant events:  ***  Consultants:  ***  Procedures/Surgeries: ***      ASSESSMENT & PLAN:   Acute cystitis / History of ESBL E. coli UTI 06/2023 : Persistent lactic acidosis with now leukocytosis, has history of ESBL E. coli.  POA  CT abdomen and pelvis showing circumferential bladder wall thickening which may represent cystitis Patient has prior history of ESBL and was initiated on ertapenem but unfortunately no urine culture done Continue ertapenem to complete 5 days course on 09/10/2023 Hopefully discharge tomorrow after last dose of antibiotics   Sepsis  / Possible SIRS, suspect sepsis / Pneumonia versus UTI : Workup more inclined towards UTI Patient has history of both aspiration pneumonia as well as ESBL E. coli UTI Continue current antibiotics   HCAP (healthcare-associated pneumonia) / Dysphagia as late effect of CVA with prior history of aspiration pneumonia  -  Patient was noted to be coughing excessively on the day of arrival with blood-tinged sputum Continue pured diet with honey thickened liquid Continue aspiration precaution Rocephin, azithromycin and vancomycin- discontinued  Speech therapist on board we appreciate input   Esophagitis  Patient with coughing and on dysphagia diet CT abdomen and pelvis shows wall thickening of the mid and distal esophagus which can be seen with reflux or esophagitis Continue Protonix No recent EGD noted on record Continue aspiration precaution   AF (paroxysmal atrial fibrillation)  Continue Eliquis Not currently on rate control agents per review of current med list   HTN (hypertension) Holding antihypertensives due to soft blood pressure in the setting of sepsis   History of stroke / Left carotid artery stenosis s/p endarterectomy Continue apixaban and atorvastatin   Seizure disorder :  Continue Keppra 500 mg twice daily   Dementia :  Continue trazodone at bedtime Continue delirium  precaution    *** based on BMI: Body mass index is 26.29 kg/m.   DVT prophylaxis: *** IV fluids: *** continuous IV fluids  Nutrition: *** Central lines / invasive devices: ***  Code Status: *** ACP documentation reviewed: *** none on file in [facility]  TOC needs: *** Barriers to dispo / significant pending items: ***

## 2023-09-10 NOTE — Discharge Summary (Signed)
Physician Discharge Summary   Patient: Richard Carter MRN: [id number]  DOB: 10-16-1941   Admit:     Date of Admission: 09/04/2023 Admitted from: home   Discharge: Date of discharge: 09/10/23 Disposition: Home health Condition at discharge: fair  CODE STATUS: DNR     Discharge Physician: Sunnie Nielsen, DO [facility]     PCP: Marisue Ivan, MD  Recommendations for Outpatient Follow-up:  Follow up with PCP Marisue Ivan, MD in 1-2 weeks Referral also made for infectious disease d/t recurrent UTI, ESBL Please follow up on the following pending results: none   Discharge Instructions     Ambulatory referral to Infectious Disease   Complete by: As directed    Diet - low sodium heart healthy   Complete by: As directed    Increase activity slowly   Complete by: As directed          Discharge Diagnoses: Principal Problem:   Sepsis (HCC) Active Problems:   Dysphagia as late effect of cerebrovascular accident (CVA)   HCAP (healthcare-associated pneumonia)   History of ESBL E. coli UTI 06/2023   Acute cystitis   Esophagitis   AF (paroxysmal atrial fibrillation) (HCC)   HTN (hypertension)   History of stroke   Stenosis of left carotid artery s/p endarterectomy   Seizure disorder (HCC)   Dementia [facility])          Hospital course / significant events:   HPI: 82 y.o. male with medical history significant for CVA with residual dysphagia on a pured and honey thick diet, carotid artery stenosis s/p left CEA, seizure disorder, dementia, A-fib on Eliquis, HTN, HLD,  COPD, tobacco use disorder,  with last hospitalization 8/16 to 07/18/2023 with acute encephalopathy attributed to ESBL E. coli UTI treated with ertapenem as well as possible seizure, who presented for evaluation of shortness of blood with possible hemoptysis and a strong odor to his urine.  Initial history was given by POA Jackalyn Lombard over the phone who stated that patient was in his  usual state of health until 10/10 when he noted that patient was coughing much more than his usual and was bringing up blood-streaked phlegm.  He also appeared very short of breath he denied chest pain, abdominal pain and had no vomiting, abdominal pain or diarrhea.  Also had no fever or chills.  He also noted that since the day prior patient has had a very strong odor to his urine and he is concerned about a UTI.  At baseline patient maintains independence in ADLs.  His POA is his full-time caregiver and he has home health coming out 3 times a week.   Admitted to hospitalist service to complete 5 day course ertapenem here for ESBL UTI. Relatively uneventful course otherwise, stable for d/c today 09/10/23 once last dose abx completed.   Consultants:  none  Procedures/Surgeries: none      ASSESSMENT & PLAN:   Acute cystitis / History of ESBL E. coli UTI 06/2023 : Persistent lactic acidosis with now leukocytosis, has history of ESBL E. coli.  POA  CT abdomen and pelvis showing circumferential bladder wall thickening which may represent cystitis Patient has prior history of ESBL and was initiated on ertapenem but unfortunately no urine culture done ertapenem to complete 5 days course on 09/10/2023 Follow w/ ID outpatient    Sepsis  / Possible SIRS, suspect sepsis / Pneumonia versus UTI : Workup more inclined towards UTI Patient has history of both aspiration pneumonia as well  as ESBL E. coli UTI Continue current antibiotics   HCAP (healthcare-associated pneumonia) ruled out / Dysphagia as late effect of CVA with prior history of aspiration pneumonia  -  Patient was noted to be coughing excessively on the day of arrival with blood-tinged sputum Continue pured diet with honey thickened liquid Continue aspiration precaution Speech therapist on board we appreciate input   Esophagitis  Patient with coughing and on dysphagia diet CT abdomen and pelvis shows wall thickening of the mid and  distal esophagus which can be seen with reflux or esophagitis Continue Protonix No recent EGD noted on record, consider GI f/u  Continue aspiration precaution   AF (paroxysmal atrial fibrillation)  Continue Eliquis Not currently on rate control agents    HTN (hypertension) Initially held antihypertensives due to soft blood pressure in the setting of sepsis, restart per med rec below    History of stroke / Left carotid artery stenosis s/p endarterectomy Continue apixaban and atorvastatin   Seizure disorder :  Continue Keppra 500 mg twice daily   Dementia :  Continue trazodone at bedtime Continue delirium precaution           Discharge Instructions  Allergies as of 09/10/2023   No Known Allergies      Medication List     TAKE these medications    acetaminophen 500 MG tablet Commonly known as: TYLENOL Take 1,000 mg by mouth 2 (two) times daily as needed for moderate pain, fever or headache.   albuterol (2.5 MG/3ML) 0.083% nebulizer solution Commonly known as: PROVENTIL Take 3 mLs (2.5 mg total) by nebulization every 6 (six) hours as needed for wheezing or shortness of breath.   amLODipine 10 MG tablet Commonly known as: NORVASC Take 1 tablet (10 mg total) by mouth daily.   apixaban 5 MG Tabs tablet Commonly known as: ELIQUIS Take 1 tablet (5 mg total) by mouth 2 (two) times daily.   ascorbic acid 500 MG tablet Commonly known as: VITAMIN C Take 500 mg by mouth daily.   atorvastatin 20 MG tablet Commonly known as: LIPITOR Take 20 mg by mouth every evening.   bisacodyl 5 MG EC tablet Commonly known as: DULCOLAX Take 1 tablet (5 mg total) by mouth daily as needed for severe constipation.   Compressor/Nebulizer Misc Use as directed for nebulized medications   food thickener Liqd Commonly known as: SIMPLYTHICK (HONEY/LEVEL 3/MODERATELY THICK) Take 1 packet by mouth as needed.   levETIRAcetam 500 MG tablet Commonly known as: KEPPRA Take 1 tablet  (500 mg total) by mouth 2 (two) times daily.   ondansetron 4 MG disintegrating tablet Commonly known as: ZOFRAN-ODT Take 4 mg by mouth every 8 (eight) hours as needed for nausea or vomiting.   pantoprazole 40 MG tablet Commonly known as: Protonix Take 1 tablet (40 mg total) by mouth daily.   senna-docusate 8.6-50 MG tablet Commonly known as: Senokot-S Take 1 tablet by mouth at bedtime.   tamsulosin 0.4 MG Caps capsule Commonly known as: FLOMAX Take 1 capsule (0.4 mg total) by mouth daily after breakfast.   traZODone 100 MG tablet Commonly known as: DESYREL Take 1 tablet (100 mg total) by mouth at bedtime.   vitamin B-12 500 MCG tablet Commonly known as: CYANOCOBALAMIN Take 500 mcg by mouth daily.               Durable Medical Equipment  (From admission, onward)           Start     Ordered  09/10/23 1110  For home use only DME Nebulizer machine  Once       Question Answer Comment  Patient needs a nebulizer to treat with the following condition COPD (chronic obstructive pulmonary disease) (HCC)   Length of Need Lifetime      09/10/23 1111             Follow-up Information     [facility].   Specialty: Home Health Services Why: They will resume home health therapy at discharge. Contact information: [address] [place] [address] [phone]                 No Known Allergies   Subjective: pt reports feeling fine, speech limited, caretaker is at bedside and has no concerns   Discharge Exam: BP (!) 166/59 (BP Location: Left Arm)   Pulse (!) 56   Temp 97.9 F (36.6 C)   Resp 18   Ht 5\' 2"  (1.575 m)   Wt 65.2 kg   SpO2 97%   BMI 26.29 kg/m  General: Pt is alert, awake, not in acute distress Cardiovascular: RRR, S1/S2, no rubs, no gallops Respiratory: CTA bilaterally, no wheezing, no rhonchi Abdominal: Soft, NT, ND, bowel sounds + Extremities: no edema, no cyanosis     The results of  significant diagnostics from this hospitalization (including imaging, microbiology, ancillary and laboratory) are listed below for reference.     Microbiology: Recent Results (from the past 240 hour(s))  Resp Panel by RT-PCR (Flu A&B, Covid) Anterior Nasal Swab     Status: None   Collection Time: 09/04/23  3:19 PM   Specimen: Anterior Nasal Swab  Result Value Ref Range Status   SARS Coronavirus 2 by RT PCR NEGATIVE NEGATIVE Final    Comment: (NOTE) SARS-CoV-2 target nucleic acids are NOT DETECTED.  The SARS-CoV-2 RNA is generally detectable in upper respiratory specimens during the acute phase of infection. The lowest concentration of SARS-CoV-2 viral copies this assay can detect is 138 copies/mL. A negative result does not preclude SARS-Cov-2 infection and should not be used as the sole basis for treatment or other patient management decisions. A negative result may occur with  improper specimen collection/handling, submission of specimen other than nasopharyngeal swab, presence of viral mutation(s) within the areas targeted by this assay, and inadequate number of viral copies(<138 copies/mL). A negative result must be combined with clinical observations, patient history, and epidemiological information. The expected result is Negative.  Fact Sheet for Patients:  BloggerCourse.com  Fact Sheet for Healthcare Providers:  SeriousBroker.it  This test is no t yet approved or cleared by the [place] FDA and  has been authorized for detection and/or diagnosis of SARS-CoV-2 by FDA under an Emergency Use Authorization (EUA). This EUA will remain  in effect (meaning this test can be used) for the duration of the COVID-19 declaration under Section 564(b)(1) of the Act, 21 U.S.C.section 360bbb-3(b)(1), unless the authorization is terminated  or revoked sooner.       Influenza A by PCR NEGATIVE NEGATIVE Final   Influenza B by PCR  NEGATIVE NEGATIVE Final    Comment: (NOTE) The Xpert Xpress SARS-CoV-2/FLU/RSV plus assay is intended as an aid in the diagnosis of influenza from Nasopharyngeal swab specimens and should not be used as a sole basis for treatment. Nasal washings and aspirates are unacceptable for Xpert Xpress SARS-CoV-2/FLU/RSV testing.  Fact Sheet for Patients: BloggerCourse.com  Fact Sheet for Healthcare Providers: SeriousBroker.it  This test is  not yet approved or cleared by the [place] and has been authorized for detection and/or diagnosis of SARS-CoV-2 by FDA under an Emergency Use Authorization (EUA). This EUA will remain in effect (meaning this test can be used) for the duration of the COVID-19 declaration under Section 564(b)(1) of the Act, 21 U.S.C. section 360bbb-3(b)(1), unless the authorization is terminated or revoked.  Performed at [facility], [address] [place], [place] [address]   Blood Culture (routine x 2)     Status: None   Collection Time: 09/04/23  3:21 PM   Specimen: BLOOD  Result Value Ref Range Status   Specimen Description BLOOD RIGHT ANTECUBITAL  Final   Special Requests   Final    BOTTLES DRAWN AEROBIC AND ANAEROBIC Blood Culture adequate volume   Culture   Final    NO GROWTH 5 DAYS Performed at [facility], [address] [place], [place] [address]    Report Status 09/10/2023 FINAL  Final  Blood Culture (routine x 2)     Status: None   Collection Time: 09/05/23 12:28 AM   Specimen: BLOOD  Result Value Ref Range Status   Specimen Description BLOOD BLOOD LEFT HAND  Final   Special Requests   Final    BOTTLES DRAWN AEROBIC AND ANAEROBIC Blood Culture adequate volume   Culture   Final    NO GROWTH 5 DAYS Performed at [facility], [address] [place], [place] [address]    Report Status 09/10/2023 FINAL  Final  MRSA Next Gen by PCR, Nasal     Status: None    Collection Time: 09/05/23  3:10 PM   Specimen: Nasal Mucosa; Nasal Swab  Result Value Ref Range Status   MRSA by PCR Next Gen NOT DETECTED NOT DETECTED Final    Comment: (NOTE) The GeneXpert MRSA Assay (FDA approved for NASAL specimens only), is one component of a comprehensive MRSA colonization surveillance program. It is not intended to diagnose MRSA infection nor to guide or monitor treatment for MRSA infections. Test performance is not FDA approved in patients less than 65 years old. Performed at [facility], [address] [place], [place] [address]      Labs: BNP (last 3 results) No results for input(s): "BNP" in the last 8760 hours. Basic Metabolic Panel: Recent Labs  Lab 09/06/23 0407 09/07/23 0338 09/08/23 0400 09/09/23 0455 09/10/23 0431  NA 141 141 142 138 140  K 3.8 3.7 3.9 3.8 4.2  CL 113* 112* 113* 111 112*  CO2 20* 20* 23 22 23   GLUCOSE 102* 98 81 85 79  BUN 43* 38* 26* 22 23  CREATININE 1.23 1.09 0.92 0.99 1.00  CALCIUM 9.5 9.0 9.1 8.9 9.1  MG  --   --   --   --  1.9  PHOS  --   --   --   --  3.2   Liver Function Tests: Recent Labs  Lab 09/04/23 1502 09/05/23 0028 09/06/23 0407 09/07/23 0338 09/08/23 0400  AST 18 18 13* 12* 13*  ALT 12 11 9 9 10   ALKPHOS 83 76 58 59 59  BILITOT 0.8 0.5 0.4 0.4 0.3  PROT 7.7 7.0 6.1* 6.0* 5.7*  ALBUMIN 3.8 3.4* 3.2* 3.0* 2.9*   Recent Labs  Lab 09/04/23 1502  LIPASE 27   Recent Labs  Lab 09/04/23 1502  AMMONIA 19   CBC: Recent Labs  Lab 09/06/23 0407 09/07/23 0338 09/08/23 0400 09/09/23 0455 09/10/23 0431  WBC 10.9* 7.3 7.4 7.5 6.7  NEUTROABS  --   --   --  4.3 3.5  HGB 7.4* 7.3* 7.5* 7.3* 7.8*  HCT 22.5* 23.1* 23.7* 23.3* 24.0*  MCV 90.4 92.8 92.6 92.5 92.3  PLT 250 256 248 263 272   Cardiac Enzymes: No results for input(s): "CKTOTAL", "CKMB", "CKMBINDEX", "TROPONINI" in the last 168 hours. BNP: Invalid input(s): "POCBNP" CBG: Recent Labs  Lab 09/10/23 0753  GLUCAP 80    D-Dimer No results for input(s): "DDIMER" in the last 72 hours. Hgb A1c No results for input(s): "HGBA1C" in the last 72 hours. Lipid Profile No results for input(s): "CHOL", "HDL", "LDLCALC", "TRIG", "CHOLHDL", "LDLDIRECT" in the last 72 hours. Thyroid function studies No results for input(s): "TSH", "T4TOTAL", "T3FREE", "THYROIDAB" in the last 72 hours.  Invalid input(s): "FREET3" Anemia work up No results for input(s): "VITAMINB12", "FOLATE", "FERRITIN", "TIBC", "IRON", "RETICCTPCT" in the last 72 hours. Urinalysis    Component Value Date/Time   COLORURINE YELLOW (A) 09/04/2023 2148   APPEARANCEUR CLOUDY (A) 09/04/2023 2148   APPEARANCEUR Clear 06/12/2014 1029   LABSPEC 1.036 (H) 09/04/2023 2148   LABSPEC 1.005 06/12/2014 1029   PHURINE 5.0 09/04/2023 2148   GLUCOSEU NEGATIVE 09/04/2023 2148   GLUCOSEU Negative 06/12/2014 1029   HGBUR MODERATE (A) 09/04/2023 2148   BILIRUBINUR NEGATIVE 09/04/2023 2148   BILIRUBINUR Negative 06/12/2014 1029   KETONESUR NEGATIVE 09/04/2023 2148   PROTEINUR NEGATIVE 09/04/2023 2148   NITRITE NEGATIVE 09/04/2023 2148   LEUKOCYTESUR LARGE (A) 09/04/2023 2148   LEUKOCYTESUR Negative 06/12/2014 1029   Sepsis Labs Recent Labs  Lab 09/07/23 0338 09/08/23 0400 09/09/23 0455 09/10/23 0431  WBC 7.3 7.4 7.5 6.7   Microbiology Recent Results (from the past 240 hour(s))  Resp Panel by RT-PCR (Flu A&B, Covid) Anterior Nasal Swab     Status: None   Collection Time: 09/04/23  3:19 PM   Specimen: Anterior Nasal Swab  Result Value Ref Range Status   SARS Coronavirus 2 by RT PCR NEGATIVE NEGATIVE Final    Comment: (NOTE) SARS-CoV-2 target nucleic acids are NOT DETECTED.  The SARS-CoV-2 RNA is generally detectable in upper respiratory specimens during the acute phase of infection. The lowest concentration of SARS-CoV-2 viral copies this assay can detect is 138 copies/mL. A negative result does not preclude SARS-Cov-2 infection and should not  be used as the sole basis for treatment or other patient management decisions. A negative result may occur with  improper specimen collection/handling, submission of specimen other than nasopharyngeal swab, presence of viral mutation(s) within the areas targeted by this assay, and inadequate number of viral copies(<138 copies/mL). A negative result must be combined with clinical observations, patient history, and epidemiological information. The expected result is Negative.  Fact Sheet for Patients:  BloggerCourse.com  Fact Sheet for Healthcare Providers:  SeriousBroker.it  This test is no t yet approved or cleared by the [place] FDA and  has been authorized for detection and/or diagnosis of SARS-CoV-2 by FDA under an Emergency Use Authorization (EUA). This EUA will remain  in effect (meaning this test can be used) for the duration of the COVID-19 declaration under Section 564(b)(1) of the Act, 21 U.S.C.section 360bbb-3(b)(1), unless the authorization is terminated  or revoked sooner.       Influenza A by PCR NEGATIVE NEGATIVE Final   Influenza B by PCR NEGATIVE NEGATIVE Final    Comment: (NOTE) The Xpert Xpress SARS-CoV-2/FLU/RSV plus assay is intended as an aid in the diagnosis of influenza from  Nasopharyngeal swab specimens and should not be used as a sole basis for treatment. Nasal washings and aspirates are unacceptable for Xpert Xpress SARS-CoV-2/FLU/RSV testing.  Fact Sheet for Patients: BloggerCourse.com  Fact Sheet for Healthcare Providers: SeriousBroker.it  This test is not yet approved or cleared by the [place] FDA and has been authorized for detection and/or diagnosis of SARS-CoV-2 by FDA under an Emergency Use Authorization (EUA). This EUA will remain in effect (meaning this test can be used) for the duration of the COVID-19 declaration under Section  564(b)(1) of the Act, 21 U.S.C. section 360bbb-3(b)(1), unless the authorization is terminated or revoked.  Performed at [facility], [address] [place], [place] [address]   Blood Culture (routine x 2)     Status: None   Collection Time: 09/04/23  3:21 PM   Specimen: BLOOD  Result Value Ref Range Status   Specimen Description BLOOD RIGHT ANTECUBITAL  Final   Special Requests   Final    BOTTLES DRAWN AEROBIC AND ANAEROBIC Blood Culture adequate volume   Culture   Final    NO GROWTH 5 DAYS Performed at [facility], [address], [place], [place] [address]    Report Status 09/10/2023 FINAL  Final  Blood Culture (routine x 2)     Status: None   Collection Time: 09/05/23 12:28 AM   Specimen: BLOOD  Result Value Ref Range Status   Specimen Description BLOOD BLOOD LEFT HAND  Final   Special Requests   Final    BOTTLES DRAWN AEROBIC AND ANAEROBIC Blood Culture adequate volume   Culture   Final    NO GROWTH 5 DAYS Performed at [facility], [address], [place], [place] [address]    Report Status 09/10/2023 FINAL  Final  MRSA Next Gen by PCR, Nasal     Status: None   Collection Time: 09/05/23  3:10 PM   Specimen: Nasal Mucosa; Nasal Swab  Result Value Ref Range Status   MRSA by PCR Next Gen NOT DETECTED NOT DETECTED Final    Comment: (NOTE) The GeneXpert MRSA Assay (FDA approved for NASAL specimens only), is one component of a comprehensive MRSA colonization surveillance program. It is not intended to diagnose MRSA infection nor to guide or monitor treatment for MRSA infections. Test performance is not FDA approved in patients less than 31 years old. Performed at [facility], [address], [place], [place] [address]    Imaging CT ABDOMEN PELVIS W CONTRAST  Result Date: 09/04/2023 CLINICAL DATA:  Bowel obstruction suspected Hemoptysis. EXAM: CT ABDOMEN AND PELVIS WITH CONTRAST TECHNIQUE: Multidetector CT imaging of the  abdomen and pelvis was performed using the standard protocol following bolus administration of intravenous contrast. RADIATION DOSE REDUCTION: This exam was performed according to the departmental dose-optimization program which includes automated exposure control, adjustment of the mA and/or kV according to patient size and/or use of iterative reconstruction technique. CONTRAST:  75mL OMNIPAQUE IOHEXOL 350 MG/ML SOLN COMPARISON:  CT 06/30/2023 FINDINGS: Lower chest: Assessed on concurrent chest CT, reported separately. Hepatobiliary: No focal liver abnormality. Again seen gallstone. No abnormal gallbladder distension. Assessment for wall thickening is limited due to motion. No biliary dilatation. Pancreas: Few calcifications in the pancreatic head. No ductal dilatation or inflammation. Spleen: Normal in size without focal abnormality. Adrenals/Urinary Tract: No adrenal nodule. No hydronephrosis or perinephric edema. Homogeneous renal enhancement with symmetric excretion on delayed phase imaging. Unchanged bilateral renal cysts and low-density lesions too small to characterize. No further follow-up  imaging is recommended. Circumferential bladder wall thickening. Stomach/Bowel: Hiatal hernia is at least moderate in size. No small bowel dilatation. No bowel obstruction. Cecum is low-lying in the mid pelvis. Normal appendix. Small to moderate colonic stool burden. Occasional colonic diverticula. High density associated with the distal sigmoid colon may represent retained barium within diverticula. Vascular/Lymphatic: Dense aortic and branch atherosclerosis. No aortic aneurysm. Portal vein is patent. No acute vascular findings. No adenopathy. Reproductive: Prominent prostate. Stable asymmetric calcification in the left seminal vesicle. Other: No ascites or free air. Musculoskeletal: Chronic L1 compression deformity, stable. Stable L3-L4 and L4-L5 degenerative disc disease. No free air, free fluid, or intra-abdominal  fluid collection. IMPRESSION: 1. No bowel obstruction. 2. Circumferential bladder wall thickening may represent cystitis. Recommend correlation with urinalysis. 3. Additional stable chronic findings as described. Aortic Atherosclerosis (ICD10-I70.0). Electronically Signed   By: Narda Rutherford M.D.   On: 09/04/2023 19:17   CT Angio Chest PE W and/or Wo Contrast  Result Date: 09/04/2023 CLINICAL DATA:  Pulmonary embolism (PE) suspected, high prob 82 year old with shortness of breath. EXAM: CT ANGIOGRAPHY CHEST WITH CONTRAST TECHNIQUE: Multidetector CT imaging of the chest was performed using the standard protocol during bolus administration of intravenous contrast. Multiplanar CT image reconstructions and MIPs were obtained to evaluate the vascular anatomy. RADIATION DOSE REDUCTION: This exam was performed according to the departmental dose-optimization program which includes automated exposure control, adjustment of the mA and/or kV according to patient size and/or use of iterative reconstruction technique. CONTRAST:  75mL OMNIPAQUE IOHEXOL 350 MG/ML SOLN COMPARISON:  Radiograph earlier today. FINDINGS: Cardiovascular: There are no filling defects within the pulmonary arteries to suggest pulmonary embolus. Aortic atherosclerosis without acute aortic findings or aneurysm. Heart size is normal. There are coronary artery calcifications. No significant pericardial effusion. Mediastinum/Nodes: Moderate-sized hiatal hernia. Wall thickening of the mid and distal esophagus. Small right hilar and mediastinal lymph nodes are not enlarged by size criteria, typically reactive. Lungs/Pleura: Mild bronchial thickening. Ground-glass opacities in the dependent right lower lobe may be hypoventilatory but are nonspecific. No confluent airspace disease. No pleural fluid. No suspicious pulmonary nodule or mass. Calcified granuloma at the left lung base. Upper Abdomen: Assessed on concurrent abdominopelvic CT, reported separately.  Musculoskeletal: There are no acute or suspicious osseous abnormalities. There are remote bilateral rib fractures. Right gynecomastia. Review of the MIP images confirms the above findings. IMPRESSION: 1. No pulmonary embolus. 2. Mild bronchial thickening. Ground-glass opacities in the dependent right lower lobe may be hypoventilatory, non but nonspecific. Infection is not entirely excluded. 3. Moderate-sized hiatal hernia. Wall thickening of the mid and distal esophagus, can be seen with reflux or esophagitis. Aortic Atherosclerosis (ICD10-I70.0). Electronically Signed   By: Narda Rutherford M.D.   On: 09/04/2023 19:12   CT Head Wo Contrast  Result Date: 09/04/2023 CLINICAL DATA:  Head trauma, repeat vomiting (Age 51-64y) EXAM: CT HEAD WITHOUT CONTRAST TECHNIQUE: Contiguous axial images were obtained from the base of the skull through the vertex without intravenous contrast. RADIATION DOSE REDUCTION: This exam was performed according to the departmental dose-optimization program which includes automated exposure control, adjustment of the mA and/or kV according to patient size and/or use of iterative reconstruction technique. COMPARISON:  MRI head 07/12/2023.  CT head 06/30/2023. FINDINGS: Brain: Remote left frontal infarct. Additional patchy white matter hypodensities, compatible with chronic microvascular ischemic change. No evidence of acute hemorrhage, mass lesion, midline shift or hydrocephalus. Vascular: Calcific atherosclerosis. Skull: No acute fracture. Sinuses/Orbits: Clear sinuses.  No acute orbital findings. Other: No mastoid  effusions. IMPRESSION: 1. No evidence of acute intracranial abnormality. 2. Chronic ischemic injury and cerebral atrophy (ICD10-G31.9). Electronically Signed   By: Feliberto Harts M.D.   On: 09/04/2023 19:10   DG Chest 2 View  Result Date: 09/04/2023 CLINICAL DATA:  Shortness of breath.  Hemoptysis. EXAM: CHEST - 2 VIEW COMPARISON:  07/11/2023. FINDINGS: Low lung volume.  Probable atelectatic changes at the lung bases. Bilateral lung fields are clear. No acute consolidation, lung mass or lung collapse. Bilateral costophrenic angles are clear. Normal cardio-mediastinal silhouette. No acute osseous abnormalities. Multiple old healed bilateral rib fractures noted. The soft tissues are within normal limits. IMPRESSION: 1. Low lung volume. No active cardiopulmonary disease. Electronically Signed   By: Jules Schick M.D.   On: 09/04/2023 13:57      Time coordinating discharge: over 30 minutes  SIGNED:  Sunnie Nielsen DO [facility]

## 2023-09-12 ENCOUNTER — Emergency Department: Payer: Medicare HMO

## 2023-09-12 ENCOUNTER — Inpatient Hospital Stay
Admission: EM | Admit: 2023-09-12 | Discharge: 2023-09-14 | DRG: 377 | Disposition: A | Discharge status: Home Health | Payer: Medicare HMO | Attending: Internal Medicine | Admitting: Internal Medicine

## 2023-09-12 ENCOUNTER — Other Ambulatory Visit: Payer: Self-pay

## 2023-09-12 DIAGNOSIS — Z823 Family history of stroke: Secondary | ICD-10-CM

## 2023-09-12 DIAGNOSIS — G40909 Epilepsy, unspecified, not intractable, without status epilepticus: Secondary | ICD-10-CM | POA: Diagnosis present

## 2023-09-12 DIAGNOSIS — Z66 Do not resuscitate: Secondary | ICD-10-CM | POA: Diagnosis not present

## 2023-09-12 DIAGNOSIS — Z7901 Long term (current) use of anticoagulants: Secondary | ICD-10-CM

## 2023-09-12 DIAGNOSIS — Z6826 Body mass index (BMI) 26.0-26.9, adult: Secondary | ICD-10-CM

## 2023-09-12 DIAGNOSIS — R918 Other nonspecific abnormal finding of lung field: Secondary | ICD-10-CM | POA: Diagnosis not present

## 2023-09-12 DIAGNOSIS — J690 Pneumonitis due to inhalation of food and vomit: Principal | ICD-10-CM | POA: Diagnosis present

## 2023-09-12 DIAGNOSIS — K219 Gastro-esophageal reflux disease without esophagitis: Secondary | ICD-10-CM | POA: Diagnosis present

## 2023-09-12 DIAGNOSIS — Z1612 Extended spectrum beta lactamase (ESBL) resistance: Secondary | ICD-10-CM | POA: Diagnosis present

## 2023-09-12 DIAGNOSIS — I959 Hypotension, unspecified: Secondary | ICD-10-CM | POA: Diagnosis not present

## 2023-09-12 DIAGNOSIS — R0602 Shortness of breath: Secondary | ICD-10-CM | POA: Diagnosis not present

## 2023-09-12 DIAGNOSIS — Z859 Personal history of malignant neoplasm, unspecified: Secondary | ICD-10-CM

## 2023-09-12 DIAGNOSIS — K8689 Other specified diseases of pancreas: Secondary | ICD-10-CM | POA: Diagnosis not present

## 2023-09-12 DIAGNOSIS — K922 Gastrointestinal hemorrhage, unspecified: Secondary | ICD-10-CM | POA: Diagnosis not present

## 2023-09-12 DIAGNOSIS — Z8673 Personal history of transient ischemic attack (TIA), and cerebral infarction without residual deficits: Secondary | ICD-10-CM

## 2023-09-12 DIAGNOSIS — I69391 Dysphagia following cerebral infarction: Secondary | ICD-10-CM | POA: Diagnosis not present

## 2023-09-12 DIAGNOSIS — R531 Weakness: Secondary | ICD-10-CM

## 2023-09-12 DIAGNOSIS — I2510 Atherosclerotic heart disease of native coronary artery without angina pectoris: Secondary | ICD-10-CM | POA: Diagnosis present

## 2023-09-12 DIAGNOSIS — I69351 Hemiplegia and hemiparesis following cerebral infarction affecting right dominant side: Secondary | ICD-10-CM

## 2023-09-12 DIAGNOSIS — F0390 Unspecified dementia without behavioral disturbance: Secondary | ICD-10-CM | POA: Diagnosis not present

## 2023-09-12 DIAGNOSIS — Z87891 Personal history of nicotine dependence: Secondary | ICD-10-CM | POA: Diagnosis not present

## 2023-09-12 DIAGNOSIS — I10 Essential (primary) hypertension: Secondary | ICD-10-CM | POA: Diagnosis not present

## 2023-09-12 DIAGNOSIS — K449 Diaphragmatic hernia without obstruction or gangrene: Secondary | ICD-10-CM | POA: Diagnosis not present

## 2023-09-12 DIAGNOSIS — I480 Paroxysmal atrial fibrillation: Secondary | ICD-10-CM | POA: Diagnosis present

## 2023-09-12 DIAGNOSIS — R680 Hypothermia, not associated with low environmental temperature: Secondary | ICD-10-CM | POA: Diagnosis present

## 2023-09-12 DIAGNOSIS — I69320 Aphasia following cerebral infarction: Secondary | ICD-10-CM

## 2023-09-12 DIAGNOSIS — K2971 Gastritis, unspecified, with bleeding: Secondary | ICD-10-CM | POA: Diagnosis not present

## 2023-09-12 DIAGNOSIS — K8020 Calculus of gallbladder without cholecystitis without obstruction: Secondary | ICD-10-CM | POA: Diagnosis not present

## 2023-09-12 DIAGNOSIS — Z79899 Other long term (current) drug therapy: Secondary | ICD-10-CM

## 2023-09-12 DIAGNOSIS — R4182 Altered mental status, unspecified: Secondary | ICD-10-CM | POA: Diagnosis not present

## 2023-09-12 DIAGNOSIS — J449 Chronic obstructive pulmonary disease, unspecified: Secondary | ICD-10-CM | POA: Diagnosis present

## 2023-09-12 DIAGNOSIS — N390 Urinary tract infection, site not specified: Secondary | ICD-10-CM | POA: Diagnosis present

## 2023-09-12 DIAGNOSIS — D509 Iron deficiency anemia, unspecified: Secondary | ICD-10-CM | POA: Diagnosis present

## 2023-09-12 DIAGNOSIS — R4701 Aphasia: Secondary | ICD-10-CM | POA: Diagnosis not present

## 2023-09-12 DIAGNOSIS — D62 Acute posthemorrhagic anemia: Secondary | ICD-10-CM | POA: Diagnosis present

## 2023-09-12 DIAGNOSIS — E663 Overweight: Secondary | ICD-10-CM | POA: Diagnosis present

## 2023-09-12 DIAGNOSIS — R112 Nausea with vomiting, unspecified: Secondary | ICD-10-CM | POA: Diagnosis present

## 2023-09-12 DIAGNOSIS — D638 Anemia in other chronic diseases classified elsewhere: Secondary | ICD-10-CM | POA: Diagnosis not present

## 2023-09-12 DIAGNOSIS — I6782 Cerebral ischemia: Secondary | ICD-10-CM | POA: Diagnosis not present

## 2023-09-12 DIAGNOSIS — R1111 Vomiting without nausea: Secondary | ICD-10-CM | POA: Diagnosis not present

## 2023-09-12 DIAGNOSIS — R0989 Other specified symptoms and signs involving the circulatory and respiratory systems: Secondary | ICD-10-CM | POA: Diagnosis not present

## 2023-09-12 DIAGNOSIS — E785 Hyperlipidemia, unspecified: Secondary | ICD-10-CM | POA: Diagnosis present

## 2023-09-12 DIAGNOSIS — G319 Degenerative disease of nervous system, unspecified: Secondary | ICD-10-CM | POA: Diagnosis not present

## 2023-09-12 DIAGNOSIS — I63523 Cerebral infarction due to unspecified occlusion or stenosis of bilateral anterior cerebral arteries: Secondary | ICD-10-CM | POA: Diagnosis not present

## 2023-09-12 DIAGNOSIS — R58 Hemorrhage, not elsewhere classified: Secondary | ICD-10-CM | POA: Diagnosis not present

## 2023-09-12 DIAGNOSIS — R29818 Other symptoms and signs involving the nervous system: Secondary | ICD-10-CM | POA: Diagnosis not present

## 2023-09-12 LAB — CBC
HCT: 21.7 % — ABNORMAL LOW (ref 39.0–52.0)
Hemoglobin: 7 g/dL — ABNORMAL LOW (ref 13.0–17.0)
MCH: 29.9 pg (ref 26.0–34.0)
MCHC: 32.3 g/dL (ref 30.0–36.0)
MCV: 92.7 fL (ref 80.0–100.0)
Platelets: 252 10*3/uL (ref 150–400)
RBC: 2.34 MIL/uL — ABNORMAL LOW (ref 4.22–5.81)
RDW: 16.7 % — ABNORMAL HIGH (ref 11.5–15.5)
WBC: 8 10*3/uL (ref 4.0–10.5)
nRBC: 0 % (ref 0.0–0.2)

## 2023-09-12 LAB — CREATININE, SERUM
Creatinine, Ser: 0.92 mg/dL (ref 0.61–1.24)
GFR, Estimated: >60 mL/min (ref >60)

## 2023-09-12 LAB — CBC WITH DIFFERENTIAL/PLATELET
Abs Immature Granulocytes: 0.1 10*3/uL — ABNORMAL HIGH (ref 0.00–0.07)
Basophils Absolute: 0.1 10*3/uL (ref 0.0–0.1)
Basophils Relative: 1 %
Eosinophils Absolute: 0.4 10*3/uL (ref 0.0–0.5)
Eosinophils Relative: 4 %
HCT: 27 % — ABNORMAL LOW (ref 39.0–52.0)
Hemoglobin: 8.6 g/dL — ABNORMAL LOW (ref 13.0–17.0)
Immature Granulocytes: 1 %
Lymphocytes Relative: 13 %
Lymphs Abs: 1.3 10*3/uL (ref 0.7–4.0)
MCH: 29.8 pg (ref 26.0–34.0)
MCHC: 31.9 g/dL (ref 30.0–36.0)
MCV: 93.4 fL (ref 80.0–100.0)
Monocytes Absolute: 0.7 10*3/uL (ref 0.1–1.0)
Monocytes Relative: 7 %
Neutro Abs: 7.5 10*3/uL (ref 1.7–7.7)
Neutrophils Relative %: 74 %
Platelets: 322 10*3/uL (ref 150–400)
RBC: 2.89 MIL/uL — ABNORMAL LOW (ref 4.22–5.81)
RDW: 16.6 % — ABNORMAL HIGH (ref 11.5–15.5)
WBC: 10 10*3/uL (ref 4.0–10.5)
nRBC: 0 % (ref 0.0–0.2)

## 2023-09-12 LAB — LACTIC ACID, PLASMA
Lactic Acid, Venous: 0.7 mmol/L (ref 0.5–1.9)
Lactic Acid, Venous: 0.9 mmol/L (ref 0.5–1.9)

## 2023-09-12 LAB — COMPREHENSIVE METABOLIC PANEL
ALT: 19 U/L (ref 0–44)
AST: 26 U/L (ref 15–41)
Albumin: 3.7 g/dL (ref 3.5–5.0)
Alkaline Phosphatase: 74 U/L (ref 38–126)
Anion gap: 9 (ref 5–15)
BUN: 18 mg/dL (ref 8–23)
CO2: 25 mmol/L (ref 22–32)
Calcium: 9.5 mg/dL (ref 8.9–10.3)
Chloride: 108 mmol/L (ref 98–111)
Creatinine, Ser: 0.94 mg/dL (ref 0.61–1.24)
GFR, Estimated: >60 mL/min (ref >60)
Glucose, Bld: 94 mg/dL (ref 70–99)
Potassium: 3.7 mmol/L (ref 3.5–5.1)
Sodium: 142 mmol/L (ref 135–145)
Total Bilirubin: 0.6 mg/dL (ref 0.3–1.2)
Total Protein: 7 g/dL (ref 6.5–8.1)

## 2023-09-12 LAB — PROCALCITONIN: Procalcitonin: <0.1 ng/mL

## 2023-09-12 LAB — BLOOD GAS, VENOUS
Acid-base deficit: 0.6 mmol/L (ref 0.0–2.0)
Bicarbonate: 25.4 mmol/L (ref 20.0–28.0)
O2 Saturation: 54.1 %
Patient temperature: 37
pCO2, Ven: 46 mm[Hg] (ref 44–60)
pH, Ven: 7.35 (ref 7.25–7.43)
pO2, Ven: 38 mm[Hg] (ref 32–45)

## 2023-09-12 LAB — TSH: TSH: 2.181 u[IU]/mL (ref 0.350–4.500)

## 2023-09-12 LAB — SAMPLE TO BLOOD BANK

## 2023-09-12 LAB — CORTISOL: Cortisol, Plasma: 8.3 ug/dL

## 2023-09-12 LAB — PROTIME-INR
INR: 1.4 — ABNORMAL HIGH (ref 0.8–1.2)
Prothrombin Time: 16.9 s — ABNORMAL HIGH (ref 11.4–15.2)

## 2023-09-12 MED ORDER — ALBUTEROL SULFATE (2.5 MG/3ML) 0.083% IN NEBU: 2.5000 mg | INHALATION_SOLUTION | Freq: Four times a day (QID) | RESPIRATORY_TRACT | Status: DC | PRN

## 2023-09-12 MED ORDER — ENOXAPARIN SODIUM 40 MG/0.4ML IJ SOSY
40.0000 mg | PREFILLED_SYRINGE | INTRAMUSCULAR | Status: DC
  Administered 2023-09-12 – 2023-09-13 (×2): 40 mg via SUBCUTANEOUS
  Filled 2023-09-12 (×2): qty 0.4

## 2023-09-12 MED ORDER — CYANOCOBALAMIN 500 MCG PO TABS
500.0000 ug | ORAL_TABLET | Freq: Every day | ORAL | Status: DC
  Administered 2023-09-13 – 2023-09-14 (×2): 500 ug via ORAL
  Filled 2023-09-12 (×2): qty 1

## 2023-09-12 MED ORDER — SODIUM CHLORIDE 0.9 % IV SOLN
2.0000 g | INTRAVENOUS | Status: DC
  Administered 2023-09-12: 2 g via INTRAVENOUS
  Filled 2023-09-12: qty 20

## 2023-09-12 MED ORDER — ATORVASTATIN CALCIUM 20 MG PO TABS
20.0000 mg | ORAL_TABLET | Freq: Every evening | ORAL | Status: DC
  Administered 2023-09-12 – 2023-09-13 (×2): 20 mg via ORAL
  Filled 2023-09-12 (×2): qty 1

## 2023-09-12 MED ORDER — PANTOPRAZOLE SODIUM 40 MG IV SOLR
40.0000 mg | Freq: Two times a day (BID) | INTRAVENOUS | Status: DC
  Administered 2023-09-12 – 2023-09-14 (×4): 40 mg via INTRAVENOUS
  Filled 2023-09-12 (×5): qty 10

## 2023-09-12 MED ORDER — IOHEXOL 300 MG/ML  SOLN
100.0000 mL | Freq: Once | INTRAMUSCULAR | Status: AC | PRN
  Administered 2023-09-12: 100 mL via INTRAVENOUS

## 2023-09-12 MED ORDER — IPRATROPIUM-ALBUTEROL 0.5-2.5 (3) MG/3ML IN SOLN
3.0000 mL | Freq: Once | RESPIRATORY_TRACT | Status: AC
  Administered 2023-09-12: 3 mL via RESPIRATORY_TRACT
  Filled 2023-09-12: qty 3

## 2023-09-12 MED ORDER — SODIUM CHLORIDE 0.9 % IV BOLUS
1000.0000 mL | Freq: Once | INTRAVENOUS | Status: AC
  Administered 2023-09-12: 1000 mL via INTRAVENOUS

## 2023-09-12 MED ORDER — LEVETIRACETAM 500 MG PO TABS
500.0000 mg | ORAL_TABLET | Freq: Two times a day (BID) | ORAL | Status: DC
  Administered 2023-09-12 – 2023-09-14 (×4): 500 mg via ORAL
  Filled 2023-09-12 (×5): qty 1

## 2023-09-12 MED ORDER — ACETAMINOPHEN 500 MG PO TABS: 1000.0000 mg | ORAL_TABLET | Freq: Two times a day (BID) | ORAL | Status: DC | PRN

## 2023-09-12 MED ORDER — VANCOMYCIN HCL 1500 MG/300ML IV SOLN
1500.0000 mg | Freq: Once | INTRAVENOUS | Status: AC
  Administered 2023-09-12: 1500 mg via INTRAVENOUS
  Filled 2023-09-12: qty 300

## 2023-09-12 MED ORDER — VITAMIN B-12 500 MCG PO TABS
500.0000 ug | ORAL_TABLET | Freq: Every day | ORAL | Status: DC
  Filled 2023-09-12: qty 1

## 2023-09-12 MED ORDER — ONDANSETRON 4 MG PO TBDP: 4.0000 mg | ORAL_TABLET | Freq: Three times a day (TID) | ORAL | Status: DC | PRN

## 2023-09-12 MED ORDER — SODIUM CHLORIDE 0.9 % IV SOLN
1.0000 g | Freq: Once | INTRAVENOUS | Status: AC
  Administered 2023-09-12: 1 g via INTRAVENOUS
  Filled 2023-09-12: qty 20

## 2023-09-12 MED ORDER — TRAZODONE HCL 50 MG PO TABS
100.0000 mg | ORAL_TABLET | Freq: Every day | ORAL | Status: DC
  Administered 2023-09-12 – 2023-09-13 (×2): 100 mg via ORAL
  Filled 2023-09-12 (×2): qty 2

## 2023-09-12 NOTE — ED Notes (Signed)
Informed RN bed assigned 

## 2023-09-12 NOTE — Progress Notes (Signed)
CODE SEPSIS - PHARMACY COMMUNICATION  **Broad Spectrum Antibiotics should be administered within 1 hour of Sepsis diagnosis**  Time Code Sepsis Called/Page Received: 11:16  Antibiotics Ordered: Meropenem and Vancomycin  Time of 1st antibiotic administration: Vancomycin given at 12:17   Foye Deer ,PharmD Clinical Pharmacist  09/12/2023  11:39 AM

## 2023-09-12 NOTE — H&P (Signed)
History and Physical    Patient: Richard Carter ZOX:[id number] DOB: 09/19/1941 DOA: 09/12/2023 DOS: the patient was seen and examined on 09/12/2023 PCP: Marisue Ivan, MD  Patient coming from: Home.  Chief Complaint:  Chief Complaint  Patient presents with   Emesis   HPI: Richard Carter is a 82 y.o. male with medical history significant of CVA with residual dysphagia, on pured and honey thick liquid, seizure disorder, dementia, atrial fibrillation on Eliquis, essential hypertension, dyslipidemia, COPD, who was brought back to the hospital today for coffee-ground emesis.  Patient was recently in the hospital and discharged 3 days ago, treated for ESBL urinary tract infection, completed 5 days of IV ertapenem.  At time of discharge, patient was able to talk, able to walk. On Wednesday, patient started having nausea and vomiting, he had some coffee-ground emesis.  He also feels weak, not able to walk. On Thursday, patient had another episode of vomiting with coffee-ground emesis.  No diarrhea.  He has not been able to talk since normal yesterday.  He was also not able to walk.  He had some short of breath, cough, nonproductive.  No fever or chills.  Upon arriving hospital, patient was hypothermic with a temperature of 94.6, blood pressure still stable.  Lab work showed stable hemoglobin of 8.6, INR 1.4.  Chest x-ray showed left lower lobe infiltrates.  CT head showed a small vessel disease without acute changes.  Patient was given a dose of vancomycin and meropenem in the emergency room and admitted to the hospital for further workup and treatment.  Review of Systems: As mentioned in the history of present illness. All other systems reviewed and are negative. Past Medical History:  Diagnosis Date   Alcohol abuse    Dementia (HCC)    GERD (gastroesophageal reflux disease)    HLD (hyperlipidemia)    HTN (hypertension)    Seizures (HCC)    Stroke [facility])    Past Surgical History:   Procedure Laterality Date   ENDARTERECTOMY Left 04/02/2023   Procedure: ENDARTERECTOMY CAROTID;  Surgeon: Renford Dills, MD;  Location: [facility] ORS;  Service: Vascular;  Laterality: Left;   skin cyst removal     Social History:  reports that he quit smoking about 21 months ago. His smoking use included cigarettes. He started smoking about 69 years ago. He has a 34 pack-year smoking history. He has never used smokeless tobacco. He reports that he does not currently use alcohol. He reports that he does not use drugs.  No Known Allergies  Family History  Problem Relation Age of Onset   Stroke Mother    Cancer Brother     Prior to Admission medications   Medication Sig Start Date End Date Taking? Authorizing Provider  acetaminophen (TYLENOL) 500 MG tablet Take 1,000 mg by mouth 2 (two) times daily as needed for moderate pain, fever or headache.   Yes [provider]  albuterol (PROVENTIL) (2.5 MG/3ML) 0.083% nebulizer solution Take 3 mLs (2.5 mg total) by nebulization every 6 (six) hours as needed for wheezing or shortness of breath. 09/10/23  Yes Sunnie Nielsen, DO  amLODipine (NORVASC) 10 MG tablet Take 1 tablet (10 mg total) by mouth daily. 04/07/23  Yes Gillis Santa, MD  apixaban (ELIQUIS) 5 MG TABS tablet Take 1 tablet (5 mg total) by mouth 2 (two) times daily. 05/29/22  Yes Lurene Shadow, MD  atorvastatin (LIPITOR) 20 MG tablet Take 20 mg by mouth every evening.   Yes [provider]  bisacodyl (DULCOLAX) 5 MG EC tablet Take 1 tablet (5 mg total) by mouth daily as needed for severe constipation. 07/18/23  Yes Pokhrel, Laxman, MD  levETIRAcetam (KEPPRA) 500 MG tablet Take 1 tablet (500 mg total) by mouth 2 (two) times daily. 07/18/23  Yes Pokhrel, Laxman, MD  ondansetron (ZOFRAN-ODT) 4 MG disintegrating tablet Take 4 mg by mouth every 8 (eight) hours as needed for nausea or vomiting.   Yes [provider]  pantoprazole (PROTONIX) 40 MG tablet Take 1 tablet (40  mg total) by mouth daily. 06/11/23 09/12/23 Yes Lurene Shadow, MD  senna-docusate (SENOKOT-S) 8.6-50 MG tablet Take 1 tablet by mouth at bedtime. 07/18/23  Yes Pokhrel, Laxman, MD  traZODone (DESYREL) 100 MG tablet Take 1 tablet (100 mg total) by mouth at bedtime. 05/11/21  Yes Sreenath, Sudheer B, MD  vitamin B-12 (CYANOCOBALAMIN) 500 MCG tablet Take 500 mcg by mouth daily.   Yes [provider]  ascorbic acid (VITAMIN C) 500 MG tablet Take 500 mg by mouth daily. Patient not taking: Reported on 09/12/2023    [provider]  food thickener (SIMPLYTHICK, HONEY/LEVEL 3/MODERATELY THICK,) LIQD Take 1 packet by mouth as needed. 09/10/23   Sunnie Nielsen, DO  Nebulizers (COMPRESSOR/NEBULIZER) MISC Use as directed for nebulized medications 09/10/23   Sunnie Nielsen, DO  tamsulosin [facility]) 0.4 MG CAPS capsule Take 1 capsule (0.4 mg total) by mouth daily after breakfast. Patient not taking: Reported on 09/12/2023 06/11/23   Lurene Shadow, MD    Physical Exam: Vitals:   09/12/23 1330 09/12/23 1356 09/12/23 1400 09/12/23 1430  BP: 107/65  (!) 100/42 125/62  Pulse: 68  71 96  Resp: 15  11 (!) 23  Temp:  (!) 96.5 F (35.8 C)    TempSrc:  Rectal    SpO2: 100%  100% 100%  Weight:      Height:       Physical Exam Constitutional:      General: He is not in acute distress.    Appearance: He is not toxic-appearing.  HENT:     Head: Normocephalic and atraumatic.     Nose: Nose normal. No congestion.     Mouth/Throat:     Mouth: Mucous membranes are moist.     Pharynx: Oropharynx is clear.  Eyes:     Conjunctiva/sclera: Conjunctivae normal.     Pupils: Pupils are equal, round, and reactive to light.  Cardiovascular:     Rate and Rhythm: Normal rate and regular rhythm.     Heart sounds: No murmur heard.    No gallop.  Pulmonary:     Effort: Pulmonary effort is normal.     Breath sounds: Normal breath sounds.  Abdominal:     General: Abdomen is flat. Bowel sounds are  normal.     Palpations: Abdomen is soft.     Tenderness: There is no abdominal tenderness.  Musculoskeletal:        General: Normal range of motion.     Cervical back: Normal range of motion and neck supple. No rigidity.     Right lower leg: No edema.     Left lower leg: No edema.  Lymphadenopathy:     Cervical: No cervical adenopathy.  Skin:    Coloration: Skin is not jaundiced.  Neurological:     Mental Status: He is alert. He is disoriented.     Comments: Expressive aphasia.  Psychiatric:     Comments: Flat affect.     Data Reviewed:  Reviewed CT scan  results, x-ray results and lab results.  Assessment and Plan: Coffee-ground emesis. Chronic anemia with iron deficiency. Patient came to the hospital with nausea vomiting and coffee-ground emesis.  Hemoglobin still stable.  He has some chronic iron deficiency based on recent iron study. This most likely is caused by gastritis, possible esophagitis.  Will hold off Eliquis, continue monitor hemoglobin.  Will consult GI if condition does not improve.  Expressive aphasia. History of stroke with dysphagia. It is possible patient had a stroke, symptoms started yesterday, not a thrombolytic candidate.  CT head without acute changes.  I discussed with neurology, Dr. Otelia Limes will see the patient. Patient will be placed on pured diet with nectar thick liquid.  Hypothermia. Could be stroke versus infection. Continue to follow.  Possible aspiration pneumonia of the left lower lobe. Most likely this is from vomiting, I reviewed patient chest x-ray, personally reviewed the image.  Patient has mild infiltrates.  Will check a procalcitonin level to guide treatment.  Currently will be treated with Rocephin.  Paroxysmal atrial fibrillation. Reviewed the patient EKG, currently patient is sinus rhythm.  Hold off anticoagulation due to coffee-ground emesis.  Essential hypertension. Continue some of the home medicines.  COPD. No  exacerbation.  Dementia without behavioral disturbance. Continue to follow.  Seizure disorder. Continue Keppra.  Overweight with BMI 26.37.    Advance Care Planning:   Code Status: Do not attempt resuscitation (DNR) PRE-ARREST INTERVENTIONS DESIRED discussed with POA, patient is DO NOT RESUSCITATE status.  Consults: Neurology  Family Communication: Sister and POA updated at bedside.  Severity of Illness: The appropriate patient status for this patient is OBSERVATION. Observation status is judged to be reasonable and necessary in order to provide the required intensity of service to ensure the patient's safety. The patient's presenting symptoms, physical exam findings, and initial radiographic and laboratory data in the context of their medical condition is felt to place them at decreased risk for further clinical deterioration. Furthermore, it is anticipated that the patient will be medically stable for discharge from the hospital within 2 midnights of admission.   Author: Marrion Coy, MD 09/12/2023 3:39 PM  For on call review [email or web address].

## 2023-09-12 NOTE — ED Triage Notes (Signed)
Pt to ED via [facility] from home for c/o emesis. Per EMS, pt states two episodes of coffee ground emesis. Pt discharged from here yesterday after 6 day hospitalization for UTI. Per fire dept, pt 85% on ra and placed on 4L nasal cannula. Pt has hx of COPD, does not use O2 at baseline. Pt has hx of stroke, takes Eliquis.  BP 126/54 HR 70 Temp 96.2  Ax Cbg 123

## 2023-09-12 NOTE — Progress Notes (Signed)
PHARMACY -  BRIEF ANTIBIOTIC NOTE   Pharmacy has received consult(s) for Meropenem and Vancomycin from an ED provider.  The patient's profile has been reviewed for ht/wt/allergies/indication/available labs.    One time order(s) placed for Meropenem 1g and Vancomycin 1500 mg  Further antibiotics/pharmacy consults should be ordered by admitting physician if indicated.                       Thank you, Foye Deer 09/12/2023  11:38 AM

## 2023-09-12 NOTE — ED Provider Notes (Signed)
[facility] Provider Note    Event Date/Time   First MD Initiated Contact with Patient 09/12/23 [phone]     (approximate)   History   Emesis   HPI  Richard Carter is a 82 y.o. male  here with nausea, vomiting, weakness. Patient is 69 and has PMHx CVA, CAD, seizure disorder, AFib on Eliquis, COPD, and was just admitted for UTI, AMS. Was discharged on 10/16. Reportedly, he was "himself" initially for about 1 day then over the last 24 hours has become increasingly confused, weak, and tired. He began vomiting, with some coffee-ground emesis. He has had a cough as well with some SOB. Denies any complaints on my assessment. Per review of records, he has a h/o ESBL infections.   Physical Exam   Triage Vital Signs: ED Triage Vitals  Encounter Vitals Group     BP 09/12/23 0849 131/76     Systolic BP Percentile --      Diastolic BP Percentile --      Pulse Rate 09/12/23 0845 80     Resp 09/12/23 0845 (!) 23     Temp 09/12/23 0849 (!) 96.2 F (35.7 C)     Temp Source 09/12/23 0849 Axillary     SpO2 09/12/23 0845 100 %     Weight 09/12/23 0846 144 lb 3.2 oz (65.4 kg)     Height 09/12/23 0846 5\' 2"  (1.575 m)     Head Circumference --      Peak Flow --      Pain Score 09/12/23 0846 0     Pain Loc --      Pain Education --      Exclude from Growth Chart --     Most recent vital signs: Vitals:   09/12/23 1430 09/12/23 1631  BP: 125/62 121/71  Pulse: 96 73  Resp: (!) 23 13  Temp:  (!) 97.1 F (36.2 C)  SpO2: 100% 100%     General: Awake, no distress.  CV:  Good peripheral perfusion. RRR. Resp:  Mild increased WOB with diminished aeration in bases, productive cough. Abd:  No distention. Mild epigastric TTP. No rebound or guarding. Other:  MMM.   ED Results / Procedures / Treatments   Labs (all labs ordered are listed, but only abnormal results are displayed) Labs Reviewed  CBC WITH DIFFERENTIAL/PLATELET - Abnormal; Notable for the following  components:      Result Value   RBC 2.89 (*)    Hemoglobin 8.6 (*)    HCT 27.0 (*)    RDW 16.6 (*)    Abs Immature Granulocytes 0.10 (*)    All other components within normal limits  PROTIME-INR - Abnormal; Notable for the following components:   Prothrombin Time 16.9 (*)    INR 1.4 (*)    All other components within normal limits  CBC - Abnormal; Notable for the following components:   RBC 2.34 (*)    Hemoglobin 7.0 (*)    HCT 21.7 (*)    RDW 16.7 (*)    All other components within normal limits  CULTURE, BLOOD (ROUTINE X 2)  CULTURE, BLOOD (ROUTINE X 2)  COMPREHENSIVE METABOLIC PANEL  LACTIC ACID, PLASMA  LACTIC ACID, PLASMA  BLOOD GAS, VENOUS  TSH  CORTISOL  CREATININE, SERUM  PROCALCITONIN  HEMOGLOBIN  PROCALCITONIN  SAMPLE TO BLOOD BANK     EKG    RADIOLOGY CXR: LLL atelectasis or PNA CT Head: NAICA CT A/P: R basilar consolidation, urinary bladder  wall thickening   I also independently reviewed and agree with radiologist interpretations.   PROCEDURES:  Critical Care performed: Yes, see critical care procedure note(s)   .Critical Care  Performed by: Shaune Pollack, MD Authorized by: Shaune Pollack, MD   Critical care provider statement:    Critical care time (minutes):  30   Critical care time was exclusive of:  Separately billable procedures and treating other patients   Critical care was necessary to treat or prevent imminent or life-threatening deterioration of the following conditions:  Circulatory failure, cardiac failure and sepsis   Critical care was time spent personally by me on the following activities:  Development of treatment plan with patient or surrogate, discussions with consultants, evaluation of patient's response to treatment, examination of patient, ordering and review of laboratory studies, ordering and review of radiographic studies, ordering and performing treatments and interventions, pulse oximetry, re-evaluation of patient's  condition and review of old charts   Care discussed with: admitting provider and accepting provider at another facility       MEDICATIONS ORDERED IN ED: Medications  acetaminophen (TYLENOL) tablet 1,000 mg (has no administration in time range)  atorvastatin (LIPITOR) tablet 20 mg (20 mg Oral Given 09/12/23 2129)  traZODone (DESYREL) tablet 100 mg (100 mg Oral Given 09/12/23 2129)  ondansetron (ZOFRAN-ODT) disintegrating tablet 4 mg (has no administration in time range)  levETIRAcetam (KEPPRA) tablet 500 mg (500 mg Oral Not Given 09/12/23 2130)  albuterol (PROVENTIL) (2.5 MG/3ML) 0.083% nebulizer solution 2.5 mg (has no administration in time range)  cefTRIAXone (ROCEPHIN) 2 g in sodium chloride 0.9 % 100 mL IVPB (2 g Intravenous New Bag/Given 09/12/23 2134)  enoxaparin (LOVENOX) injection 40 mg (40 mg Subcutaneous Given 09/12/23 2131)  pantoprazole (PROTONIX) injection 40 mg (40 mg Intravenous Not Given 09/12/23 2130)  cyanocobalamin (VITAMIN B12) tablet 500 mcg (has no administration in time range)  ipratropium-albuterol (DUONEB) 0.5-2.5 (3) MG/3ML nebulizer solution 3 mL (3 mLs Nebulization Given 09/12/23 1006)  vancomycin (VANCOREADY) IVPB 1500 mg/300 mL (0 mg Intravenous Stopped 09/12/23 1440)  meropenem (MERREM) 1 g in sodium chloride 0.9 % 100 mL IVPB (0 g Intravenous Stopped 09/12/23 1610)  sodium chloride 0.9 % bolus 1,000 mL (0 mLs Intravenous Stopped 09/12/23 2037)  iohexol (OMNIPAQUE) 300 MG/ML solution 100 mL (100 mLs Intravenous Contrast Given 09/12/23 1459)     IMPRESSION / MDM / ASSESSMENT AND PLAN / ED COURSE  I reviewed the triage vital signs and the nursing notes.                              Differential diagnosis includes, but is not limited to, sepsis 2/2 UTI, PNA, dehydration, AKI, UGIB  Patient's presentation is most consistent with acute presentation with potential threat to life or bodily function.  The patient is on the cardiac monitor to evaluate for  evidence of arrhythmia and/or significant heart rate changes   82 yo M with PMHx as above here with weakness, cough, vomiting. Suspect possible aspiration PNA vs UGIB, though more likely aspiration PNA given CXR findings, cough. WBC normal. Hgb 8.6. CMP unremarkable. INR 1.4. CT A/P obtained given his c/o abdominal pain, which shows basilar PNA, cystitis, o/w unremarkable. Will admit for possible aspiration PNA as well as possible UGIB/gastritis. Broad-spectrum ABX given for h/o ESBL UTIs in past.   FINAL CLINICAL IMPRESSION(S) / ED DIAGNOSES   Final diagnoses:  Aspiration pneumonia of right lower lobe, unspecified aspiration pneumonia type (  HCC)     Rx / DC Orders   ED Discharge Orders     None        Note:  This document was prepared using Dragon voice recognition software and may include unintentional dictation errors.   Shaune Pollack, MD 09/12/23 [phone]

## 2023-09-12 NOTE — Sepsis Progress Note (Signed)
eLink is following this Code Sepsis. °

## 2023-09-12 NOTE — ED Notes (Signed)
Lab called to obtain blood cultures.

## 2023-09-12 NOTE — ED Notes (Signed)
Patient transported to CT 

## 2023-09-12 NOTE — Consult Note (Addendum)
NEURO HOSPITALIST CONSULT NOTE   Requesting physician: Dr. Chipper Herb  Reason for Consult: Difficulty walking and talking normally  History obtained from:  Sister and Chart     HPI:                                                                                                                                          Richard Carter is an 82 y.o. male with a PMHx of stroke with residual dysphagia and chronic right sided weakness, seizures (on Keppra 500 mg BID at home), dementia, atrial fibrillation (on Eliquis), HTN, dyslipidemia, alcohol abuse and piror left CEA who presents to the ED for coffee-ground emesis and diffuse weakness with difficulty walking and talking normally. He was discharged from the hospital 3 days ago; during that admission he was treated for UTI. He was able to walk and talk at his discharge. Vitals per EMS: BP 126/54; HR 70; Temp 96.2  Ax; CBG 123.  Symptoms began on Wednesday with nausea and coffee-ground emesis. He also felt weak and unable to walk. N/V continued on Thursday. He continued to not be able to talk or walk normally. Some SOB and nonproductive cough were present. No fever or chills endorsed by the patient. Given persistence of his symptoms, decision was made to be evaluated in the ED. On arrival to the ED today the patient was hypothermic with a temp of 94.6.    CT head showed small vessel disease without acute changes.   The patient was given a dose of vancomycin and meropenem in the ED.   The patient has been seen by Neurology several times over the past 12 months, with transient neurological deficits including LOC thought secondary to heat stroke, worsened weakness and AMS.    Past Medical History:  Diagnosis Date   Alcohol abuse    Dementia (HCC)    GERD (gastroesophageal reflux disease)    HLD (hyperlipidemia)    HTN (hypertension)    Seizures (HCC)    Stroke [facility])     Past Surgical History:  Procedure Laterality Date    ENDARTERECTOMY Left 04/02/2023   Procedure: ENDARTERECTOMY CAROTID;  Surgeon: Renford Dills, MD;  Location: [facility] ORS;  Service: Vascular;  Laterality: Left;   skin cyst removal      Family History  Problem Relation Age of Onset   Stroke Mother    Cancer Brother              Social History:  reports that he quit smoking about 21 months ago. His smoking use included cigarettes. He started smoking about 69 years ago. He has a 34 pack-year smoking history. He has never used smokeless tobacco. He reports that he does not currently use alcohol. He reports that he does not use drugs.  No Known Allergies  HOME MEDICATIONS:                                                                                                                      No current facility-administered medications on file prior to encounter.   Current Outpatient Medications on File Prior to Encounter  Medication Sig Dispense Refill   acetaminophen (TYLENOL) 500 MG tablet Take 1,000 mg by mouth 2 (two) times daily as needed for moderate pain, fever or headache.     albuterol (PROVENTIL) (2.5 MG/3ML) 0.083% nebulizer solution Take 3 mLs (2.5 mg total) by nebulization every 6 (six) hours as needed for wheezing or shortness of breath. 75 mL 12   amLODipine (NORVASC) 10 MG tablet Take 1 tablet (10 mg total) by mouth daily.     apixaban (ELIQUIS) 5 MG TABS tablet Take 1 tablet (5 mg total) by mouth 2 (two) times daily. 60 tablet 0   atorvastatin (LIPITOR) 20 MG tablet Take 20 mg by mouth every evening.     bisacodyl (DULCOLAX) 5 MG EC tablet Take 1 tablet (5 mg total) by mouth daily as needed for severe constipation.     levETIRAcetam (KEPPRA) 500 MG tablet Take 1 tablet (500 mg total) by mouth 2 (two) times daily.     ondansetron (ZOFRAN-ODT) 4 MG disintegrating tablet Take 4 mg by mouth every 8 (eight) hours as needed for nausea or vomiting.     pantoprazole (PROTONIX) 40 MG tablet Take 1 tablet (40 mg total) by mouth daily.      senna-docusate (SENOKOT-S) 8.6-50 MG tablet Take 1 tablet by mouth at bedtime.     traZODone (DESYREL) 100 MG tablet Take 1 tablet (100 mg total) by mouth at bedtime.     vitamin B-12 (CYANOCOBALAMIN) 500 MCG tablet Take 500 mcg by mouth daily.     ascorbic acid (VITAMIN C) 500 MG tablet Take 500 mg by mouth daily. (Patient not taking: Reported on 09/12/2023)     food thickener (SIMPLYTHICK, HONEY/LEVEL 3/MODERATELY THICK,) LIQD Take 1 packet by mouth as needed. 100 packet 0   Nebulizers (COMPRESSOR/NEBULIZER) MISC Use as directed for nebulized medications 1 each 0   tamsulosin (FLOMAX) 0.4 MG CAPS capsule Take 1 capsule (0.4 mg total) by mouth daily after breakfast. (Patient not taking: Reported on 09/12/2023)       ROS:  As per HPI. Detailed ROS deferred in the context of acuity of presentation.    Blood pressure 125/62, pulse 96, temperature (!) 96.5 F (35.8 C), temperature source Rectal, resp. rate (!) 23, height 5\' 2"  (1.575 m), weight 65.4 kg, SpO2 100%.   General Examination:                                                                                                       Physical Exam  HEENT-  [place]/AT    Lungs- Respirations unlabored Extremities- No edema   Neurological Examination Mental Status: Awake with mildly decreased level of alertness, but good attention when redirected. Oriented to the hospital, city and state, but not to the day, month or year. Speech is dysarthric and sparse, but no naming deficit or definite expressive aphasia is noted. Able to follow all commands.    Cranial Nerves: II: Visual fields intact OU. PERRL  III,IV, VI: No ptosis. EOMI, but with saccadic quality of visual pursuits.  V: Temp sensation equal bilaterally  VII: Mildly decreased prominence of right NL fold VIII: Hearing intact to voice IX,X: Mild  hypophonia XI: Head is midline.  XII: Midline tongue extension Motor: RUE 4/5 proximally and distally. Was able to raise antigravity without drift, but then used LUE to lift RUE up when asked to perform FNF testing.  LUE 5/5 proximally and distally RLE able to lift antigravity without drift, but less briskly than on the left. Resists with 4-/5 strength.  LLE able to lift antigravity without drift. Resists with 4/5 strength. .  Sensory: Subjectively, temp and light touch are intact throughout with no asymmetry. No extinction to DSS.  Deep Tendon Reflexes: 2+ and symmetric throughout Cerebellar: No ataxia with FNF on the left. Unable to perform on the right. H-S without ataxia bilaterally  Gait: Deferred   Lab Results: Basic Metabolic Panel: Recent Labs  Lab 09/07/23 0338 09/08/23 0400 09/09/23 0455 09/10/23 0431 09/12/23 0853  NA 141 142 138 140 142  K 3.7 3.9 3.8 4.2 3.7  CL 112* 113* 111 112* 108  CO2 20* 23 22 23 25   GLUCOSE 98 81 85 79 94  BUN 38* 26* 22 23 18   CREATININE 1.09 0.92 0.99 1.00 0.94  CALCIUM 9.0 9.1 8.9 9.1 9.5  MG  --   --   --  1.9  --   PHOS  --   --   --  3.2  --     CBC: Recent Labs  Lab 09/07/23 0338 09/08/23 0400 09/09/23 0455 09/10/23 0431 09/12/23 0853  WBC 7.3 7.4 7.5 6.7 10.0  NEUTROABS  --   --  4.3 3.5 7.5  HGB 7.3* 7.5* 7.3* 7.8* 8.6*  HCT 23.1* 23.7* 23.3* 24.0* 27.0*  MCV 92.8 92.6 92.5 92.3 93.4  PLT 256 248 263 272 322    Cardiac Enzymes: No results for input(s): "CKTOTAL", "CKMB", "CKMBINDEX", "TROPONINI" in the last 168 hours.  Lipid Panel: No results for input(s): "CHOL", "TRIG", "HDL", "CHOLHDL", "VLDL", "LDLCALC" in the last 168 hours.  Imaging: CT HEAD WO CONTRAST ( )  Result Date: 09/12/2023 CLINICAL DATA:  Mental status change, unknown cause.  Vomiting. EXAM: CT HEAD WITHOUT CONTRAST TECHNIQUE: Contiguous axial images were obtained from the base of the skull through the vertex without intravenous contrast.  RADIATION DOSE REDUCTION: This exam was performed according to the departmental dose-optimization program which includes automated exposure control, adjustment of the mA and/or kV according to patient size and/or use of iterative reconstruction technique. COMPARISON:  Head CT 09/04/2023. FINDINGS: Brain: No acute hemorrhage. Unchanged moderate chronic small-vessel disease with old infarcts along left middle frontal gyrus and right precentral gyrus. Cortical gray-white differentiation is otherwise preserved. Prominence of the ventricles and sulci within expected range for age. No hydrocephalus or extra-axial collection. No mass effect or midline shift. Vascular: No hyperdense vessel or unexpected calcification. Skull: No calvarial fracture or suspicious bone lesion. Skull base is unremarkable. Sinuses/Orbits: No acute finding. Other: None. IMPRESSION: 1. No acute intracranial abnormality. 2. Unchanged moderate chronic small-vessel disease with old infarcts along left middle frontal gyrus and right precentral gyrus. Electronically Signed   By: Orvan Falconer M.D.   On: 09/12/2023 11:50   DG Chest Portable 1 View  Result Date: 09/12/2023 CLINICAL DATA:  Shortness of breath. EXAM: PORTABLE CHEST 1 VIEW COMPARISON:  09/04/2023. FINDINGS: Low lung volume. There are new left retrocardiac opacities, partially obscuring the left medial hemidiaphragm, concerning for left lung lower lobe atelectasis and/or consolidation. There is probable trace left pleural effusion. Bilateral lung fields are otherwise clear. Right lateral costophrenic angle is clear. Stable cardio-mediastinal silhouette. No acute osseous abnormalities. Multiple old healed bilateral rib fractures noted. The soft tissues are within normal limits. IMPRESSION: 1. Low lung volume. 2. New left lung lower lobe atelectasis and/or consolidation with probable trace left pleural effusion. Electronically Signed   By: Jules Schick M.D.   On: 09/12/2023 11:34      Assessment: 82 y.o. male with a PMHx of stroke with residual dysphagia, seizures (on Keppra 500 mg BID at home), dementia, atrial fibrillation (on Eliquis), HTN, dyslipidemia, alcohol abuse and piror left CEA who presents to the ED for coffee-ground emesis and diffuse weakness with difficulty walking and talking normally. He was discharged from the hospital 3 days ago; during that admission he was treated for UTI. He was able to walk and talk at his discharge. Reportedly, he was "himself" initially for about 1 day then over the last 24 hours has become increasingly confused, weak, and tired. He also felt too weak to walk. On arrival to the ED today the patient was hypothermic with a temp of 94.6.  - Exam reveals a frail-appearing elderly male with severe dysarthria and right worse than left weakness.  - CT head: No acute intracranial abnormality. Unchanged moderate chronic small-vessel disease with old infarcts along left middle frontal gyrus and right precentral gyrus.  - Labs: - CMP normal.  - Glucose normal.  - Normal white count on CBC. Hgb low at 8.6 - Lactate normal.  - INR is elevated at 1.4 - TSH normal.  - VBG normal - Recent prior CTA of head and neck (07/11/23):  Stable noncontrast head CT with no acute intracranial pathology. No emergent vascular finding. Calcified plaque in the right carotid system without hemodynamically significant stenosis. Status post left carotid endarterectomy with widely patent internal carotid artery. A small linear filling defect in the proximal external carotid artery is unchanged. Unchanged calcified plaque in the intracranial ICAs and V4 segments without high-grade stenosis or occlusion, and moderate stenosis at the right vertebral artery  origin. - Recent prior MRI brain (07/12/23): No acute or reversible finding. Prominent atrophy and chronic ischemic injury. - DDx for diffuse weakness with difficulty walking and talking normally: - Highest on the DDx is  diffuse fatigue and confusion precipitated by possible intercurrent illness in this patient with dementia, prior stroke and decreased neurological reserve.  - His right sided weakness is chronic, per sister, but an acute stroke causing worsening is also possible.  - No symptoms described to suggest breakthrough seizure   Recommendations: - Hold Eliquis in the setting of GIB.  - MRI brain to evaluate for possible new ischemic infarction.  - Continue Keppra 500 mg BID for seizure prophylaxis.  - Further recommendations following MRI.   Electronically signed: Dr. Caryl Pina 09/12/2023, 4:03 PM

## 2023-09-12 NOTE — Plan of Care (Signed)
  Problem: Fluid Volume: Goal: Hemodynamic stability will improve Outcome: Progressing   Problem: Respiratory: Goal: Ability to maintain adequate ventilation will improve Outcome: Progressing   Problem: Education: Goal: Knowledge of disease or condition will improve Outcome: Progressing   Problem: Coping: Goal: Will verbalize positive feelings about self Outcome: Progressing   Problem: Self-Care: Goal: Ability to participate in self-care as condition permits will improve Outcome: Progressing   Problem: Nutrition: Goal: Risk of aspiration will decrease Outcome: Progressing   Problem: Activity: Goal: Ability to tolerate increased activity will improve Outcome: Progressing   Problem: Respiratory: Goal: Ability to maintain adequate ventilation will improve Outcome: Progressing   Problem: Nutrition: Goal: Adequate nutrition will be maintained Outcome: Progressing   Problem: Coping: Goal: Level of anxiety will decrease Outcome: Progressing   Problem: Pain Managment: Goal: General experience of comfort will improve Outcome: Progressing   Problem: Safety: Goal: Ability to remain free from injury will improve Outcome: Progressing   Problem: Skin Integrity: Goal: Risk for impaired skin integrity will decrease Outcome: Progressing

## 2023-09-12 NOTE — Plan of Care (Signed)
CHL Tonsillectomy/Adenoidectomy, Postoperative PEDS care plan entered in error.

## 2023-09-12 NOTE — ED Notes (Signed)
Bair hugger on patient.

## 2023-09-13 ENCOUNTER — Encounter: Payer: Self-pay | Admitting: Internal Medicine

## 2023-09-13 ENCOUNTER — Inpatient Hospital Stay: Payer: Medicare HMO

## 2023-09-13 DIAGNOSIS — I480 Paroxysmal atrial fibrillation: Secondary | ICD-10-CM

## 2023-09-13 DIAGNOSIS — I69391 Dysphagia following cerebral infarction: Secondary | ICD-10-CM

## 2023-09-13 DIAGNOSIS — K922 Gastrointestinal hemorrhage, unspecified: Secondary | ICD-10-CM | POA: Diagnosis not present

## 2023-09-13 DIAGNOSIS — F0390 Unspecified dementia without behavioral disturbance: Secondary | ICD-10-CM | POA: Diagnosis not present

## 2023-09-13 LAB — HEMOGLOBIN
Hemoglobin: 7.1 g/dL — ABNORMAL LOW (ref 13.0–17.0)
Hemoglobin: 8.9 g/dL — ABNORMAL LOW (ref 13.0–17.0)

## 2023-09-13 LAB — PROCALCITONIN: Procalcitonin: <0.1 ng/mL

## 2023-09-13 LAB — PREPARE RBC (CROSSMATCH)

## 2023-09-13 MED ORDER — SODIUM CHLORIDE 0.9% IV SOLUTION: Freq: Once | INTRAVENOUS | Status: AC

## 2023-09-13 NOTE — Progress Notes (Signed)
Pt received from MRI via bed in stable condition.

## 2023-09-13 NOTE — Hospital Course (Signed)
Richard Carter is a 82 y.o. male with medical history significant of CVA with residual dysphagia, on pured and honey thick liquid, seizure disorder, dementia, atrial fibrillation on Eliquis, essential hypertension, dyslipidemia, COPD, who was brought back to the hospital today for coffee-ground emesis.  Patient received 1 unit of PRBC on 10/19.  Seen by neurology, MRI obtained.

## 2023-09-13 NOTE — Progress Notes (Signed)
SLP Cancellation Note  Patient Details Name: Richard Carter MRN: [id number] DOB: [birth year]   Cancelled treatment:       Reason Eval/Treat Not Completed: SLP screened, no needs identified, will sign off (in Acute setting currently.)  In setting of pt's BASELINE and KNOWN Dementia/stroke deficits/decline and his Dysphagia w/ RISK FOR ASPIRATION, recommend continue the Dysphagia level 1 (puree) w/ HONEY consistency liquids as ordered by MD; aspiration precautions. Supervision w/ meals for follow through w/ precautions. Oral care prior to/post meals. Dietician following. Precautions posted.   Recommend Palliative Care consult for discussion of GOC moving forward re: pt's Baseline multiple comorbidities and oropharyngeal-pharyngoesophageal phase Dysphagia w/ the impact of a Dysphagia diet as well as Dehydration issues, overall oral intake, and Pleasure/QOL.  The above was discussed w/ NSG and MD; TOC.  No further Acute ST services indicated; pt to continue his therapy at home w/ HHST services (per TOC last admit).         Jerilynn Som, MS, CCC-SLP Speech Language Pathologist Rehab Services; [facility] [phone] (ascom) Jazel Nimmons 09/13/2023, 8:31 AM

## 2023-09-13 NOTE — Plan of Care (Signed)
  Problem: Fluid Volume: Goal: Hemodynamic stability will improve Outcome: Progressing   Problem: Clinical Measurements: Goal: Diagnostic test results will improve Outcome: Progressing Goal: Signs and symptoms of infection will decrease Outcome: Progressing   Problem: Respiratory: Goal: Ability to maintain adequate ventilation will improve Outcome: Progressing   Problem: Education: Goal: Knowledge of disease or condition will improve Outcome: Progressing Goal: Knowledge of secondary prevention will improve (MUST DOCUMENT ALL) Outcome: Progressing Goal: Knowledge of patient specific risk factors will improve Loraine Leriche N/A or DELETE if not current risk factor) Outcome: Progressing   Problem: Ischemic Stroke/TIA Tissue Perfusion: Goal: Complications of ischemic stroke/TIA will be minimized Outcome: Progressing   Problem: Coping: Goal: Will verbalize positive feelings about self Outcome: Progressing Goal: Will identify appropriate support needs Outcome: Progressing   Problem: Health Behavior/Discharge Planning: Goal: Ability to manage health-related needs will improve Outcome: Progressing Goal: Goals will be collaboratively established with patient/family Outcome: Progressing   Problem: Self-Care: Goal: Ability to participate in self-care as condition permits will improve Outcome: Progressing Goal: Verbalization of feelings and concerns over difficulty with self-care will improve Outcome: Progressing Goal: Ability to communicate needs accurately will improve Outcome: Progressing   Problem: Nutrition: Goal: Risk of aspiration will decrease Outcome: Progressing Goal: Dietary intake will improve Outcome: Progressing   Problem: Activity: Goal: Ability to tolerate increased activity will improve Outcome: Progressing   Problem: Clinical Measurements: Goal: Ability to maintain a body temperature in the normal range will improve Outcome: Progressing   Problem:  Respiratory: Goal: Ability to maintain adequate ventilation will improve Outcome: Progressing Goal: Ability to maintain a clear airway will improve Outcome: Progressing   Problem: Education: Goal: Knowledge of General Education information will improve Description: Including pain rating scale, medication(s)/side effects and non-pharmacologic comfort measures Outcome: Progressing   Problem: Health Behavior/Discharge Planning: Goal: Ability to manage health-related needs will improve Outcome: Progressing   Problem: Clinical Measurements: Goal: Ability to maintain clinical measurements within normal limits will improve Outcome: Progressing Goal: Will remain free from infection Outcome: Progressing Goal: Diagnostic test results will improve Outcome: Progressing Goal: Respiratory complications will improve Outcome: Progressing Goal: Cardiovascular complication will be avoided Outcome: Progressing   Problem: Activity: Goal: Risk for activity intolerance will decrease Outcome: Progressing   Problem: Nutrition: Goal: Adequate nutrition will be maintained Outcome: Progressing   Problem: Coping: Goal: Level of anxiety will decrease Outcome: Progressing   Problem: Elimination: Goal: Will not experience complications related to bowel motility Outcome: Progressing Goal: Will not experience complications related to urinary retention Outcome: Progressing   Problem: Pain Managment: Goal: General experience of comfort will improve Outcome: Progressing   Problem: Safety: Goal: Ability to remain free from injury will improve Outcome: Progressing   Problem: Skin Integrity: Goal: Risk for impaired skin integrity will decrease Outcome: Progressing

## 2023-09-13 NOTE — Progress Notes (Signed)
PT Cancellation Note  Patient Details Name: Richard Carter MRN: [id number] DOB: 12-26-1940   Cancelled Treatment:    Reason Eval/Treat Not Completed: Medical issues which prohibited therapy Pt starting transfusion ~noon, will hold PT this date and attempt to see when medically appropriate.    Malachi Pro, DPT 09/13/2023, 12:04 PM

## 2023-09-13 NOTE — Progress Notes (Signed)
Pt sent to MRI via bed in stable condition.

## 2023-09-13 NOTE — TOC Initial Note (Signed)
Transition of Care [facility]) - Initial/Assessment Note    Patient Details  Name: Richard Carter MRN: [id number] Date of Birth: 1941/06/09  Transition of Care [facility]) CM/SW Contact:    Liliana Cline, LCSW Phone Number: 09/13/2023, 3:31 PM  Clinical Narrative:                 Patient is known to [facility] for recent admission. Patient was DC home on 10/16 with [facility] and nebulizer through [employer]. See below readmission assessment note from 10/11:   "Readmission prevention screen complete. Patient is not fully oriented. No family at bedside. CSW called his HCPOA, Renaldo Reel, introduced role, and explained that discharge planning would be discussed. PCP is Dr. Burnadette Pop. [name] drives him to appointments. Pharmacy is [facility] [address]. No issues obtaining medications. [name] stays with the patient. Patient is active with [facility] for PT, OT, and speech. [name] reports that patient has all needed DME. Per chart review, patient went to [facility] SNF 7/17-8/1 (15 days) and 8/23-9/6 (14 days). No further concerns. CSW encouraged patient to contact CSW as needed. CSW will continue to follow patient for support and facilitate return home once stable. [name] will transport patient home at discharge."  Expected Discharge Plan: Home w Home Health Services Barriers to Discharge: Continued Medical Work up   Patient Goals and CMS Choice            Expected Discharge Plan and Services       Living arrangements for the past 2 months: Single Family Home                                      Prior Living Arrangements/Services Living arrangements for the past 2 months: Single Family Home Lives with:: Other (Comment) (HCPOA) Patient language and need for interpreter reviewed:: Yes        Need for Family Participation in Patient Care: Yes (Comment) Care giver support system in place?: Yes (comment) Current home services: DME, Home OT, Home PT  (ST) Criminal Activity/Legal Involvement Pertinent to Current Situation/Hospitalization: No - Comment as needed  Activities of Daily Living   ADL Screening (condition at time of admission) Independently performs ADLs?: No Does the patient have a NEW difficulty with bathing/dressing/toileting/self-feeding that is expected to last >3 days?: No Does the patient have a NEW difficulty with getting in/out of bed, walking, or climbing stairs that is expected to last >3 days?: Yes (Initiates electronic notice to provider for possible PT consult) Does the patient have a NEW difficulty with communication that is expected to last >3 days?: Yes (Initiates electronic notice to provider for possible SLP consult) Is the patient deaf or have difficulty hearing?: No Does the patient have difficulty seeing, even when wearing glasses/contacts?: No Does the patient have difficulty concentrating, remembering, or making decisions?: Yes  Permission Sought/Granted                  Emotional Assessment         Alcohol / Substance Use: Not Applicable Psych Involvement: No (comment)  Admission diagnosis:  Acute upper GI bleed [K92.2] Patient Active Problem List   Diagnosis Date Noted   Acute upper GI bleed 09/12/2023   Expressive aphasia 09/12/2023   HCAP (healthcare-associated pneumonia) 09/04/2023   History of ESBL E. coli UTI 06/2023 09/04/2023   Esophagitis 09/04/2023   Acute cystitis  09/04/2023   Sepsis (HCC) 09/04/2023   Hypokalemia 07/16/2023   Seizure disorder (HCC) 07/11/2023   History of alcohol use disorder 05/31/2023   Goals of care, counseling/discussion 04/03/2023   Malnutrition of moderate degree 04/03/2023   Stroke (HCC) 03/27/2023   Stenosis of left carotid artery s/p endarterectomy 02/20/2023   Altered mental status 02/20/2023   Acute stroke due to ischemia (HCC) 02/17/2023   Stroke-like symptom 02/16/2023   Leukocytosis 02/16/2023   Anemia of chronic disease 07/22/2022    Protein-calorie malnutrition, severe 05/28/2022   Dysphagia as late effect of cerebrovascular accident (CVA) 05/28/2022   Acute metabolic encephalopathy 05/27/2022   History of stroke 05/27/2022   Aspiration pneumonia (HCC) 04/23/2022   Nicotine dependence    Elevated troponin    Acute CVA (cerebrovascular accident) (HCC) 05/07/2021   Non compliance w medication regimen 05/07/2021   Dementia (HCC) 05/07/2021   AF (paroxysmal atrial fibrillation) (HCC) 05/07/2021   HTN (hypertension) 11/29/2020   Alcohol abuse 11/29/2020   AKI (acute kidney injury) (HCC) 11/29/2020   Atrial flutter with rapid ventricular response (HCC) 11/29/2020   Seizures (HCC) 02/22/2020   Hypomagnesemia 10/29/2019   Pubic ramus fracture (HCC) 07/29/2019   Acetabular fracture (HCC) 07/29/2019   HLD (hyperlipidemia) 07/29/2019   GERD (gastroesophageal reflux disease) 07/29/2019   UTI (urinary tract infection) 07/29/2019   Vitamin D deficiency 09/29/2015   PCP:  Marisue Ivan, MD Pharmacy:   [facility] [address]), [place] - [address] [place] ([place]) [place] [address] Phone: [phone] Fax: [phone]     Social Determinants of Health (SDOH) Social History: SDOH Screenings   Food Insecurity: No Food Insecurity (09/13/2023)  Housing: Patient Declined (09/13/2023)  Transportation Needs: No Transportation Needs (09/13/2023)  Utilities: Not At Risk (09/13/2023)  Financial Resource Strain: Low Risk  (08/11/2023)   Received from [facility]  Tobacco Use: Medium Risk (08/11/2023)   Received from [facility]   SDOH Interventions:     Readmission Risk Interventions    09/13/2023    3:30 PM 09/05/2023    1:05 PM 04/21/2022    1:55 PM  Readmission Risk Prevention Plan  Post Dischage Appt   Complete  Medication Screening   Complete  Transportation Screening Complete Complete Complete  Medication Review (RN Care  Manager) Complete Complete   PCP or Specialist appointment within 3-5 days of discharge Complete --   HRI or Home Care Consult Complete Complete   SW Recovery Care/Counseling Consult Complete Complete   Palliative Care Screening Not Applicable Not Applicable   Skilled Nursing Facility Complete Not Applicable

## 2023-09-13 NOTE — Consult Note (Signed)
GI Inpatient Consult Note  Reason for Consult: UGIB   Attending Requesting Consult: Dr. Marrion Coy  History of Present Illness: Richard Carter is a 82 y.o. male seen for evaluation of coffee-ground emesis suspected 2/2 UGIB at the request of admitting hospitalist - Dr. Marrion Coy. Patient has a PMH of HTN, HLD, COPD, hx of seizure disorder, dementia, hx of CVA, GERD, and PAF on chronic anticoagulation with Eliquis. He presented to the [facility] ED yesterday morning via EMS for chief complaint of coffee-ground emesis and diffuse weakness with difficulty walking and talking normally. He was discharged from the hospital 10/16 where he was treated for ESBL UTI and reportedly was able to walk and talk without much difficulty. Per chart review, there are reports of coffee-ground emesis on Thursday and Friday with onset of shortness of breath, nonproductive cough, weakness, and dysarthria. Upon presentation to the ED, he was hypothermic (94.6) with otherwise stable vital signs. Labs significant for hemoglobin 8.6, MCV 93.4, BUN 18, serum creatinine 0.94, INR 1.4, and normal serum electrolytes. Chest x-ray showed LLL infiltrates. CT head showed small vessel disease without any acute changes. He was given dose of vancomycin and meropenem in ED and admitted to the hospital for further evaluation. Neurology consulted yesterday and have recommended MRI brain to rule out acute stroke. GI consulted this morning for concerns of UGIB.   Patient seen and examined bedside this morning. No family or friends bedside. He is unable to provide anything to the history. He appears disoriented and has expressive aphasia. Per chart review there is a history of NSAID use and alcohol abuse. I called and spoke with caregiver and POA, [name], about plan of care. He reports after discharge from the hospital on Wednesday he was walking and talking without difficulty. He noticed a change in mental status on Thursday with nausea,  weakness, balance issues, and altered speech. He reports he saw coffee-ground emesis episode on Thursday and Friday. Last dose of Eliquis was Thursday night. He reports patient has not drank EtOH in over 4 years. He takes Tylenol PRN for arthritis. During hospitalization June 2023 he was deemed too high-risk for EGD. Recent imaging commented on esophagitis.   Past Medical History:  Past Medical History:  Diagnosis Date   Alcohol abuse    Dementia (HCC)    GERD (gastroesophageal reflux disease)    HLD (hyperlipidemia)    HTN (hypertension)    Seizures (HCC)    Stroke [facility])     Problem List: Patient Active Problem List   Diagnosis Date Noted   Acute upper GI bleed 09/12/2023   Expressive aphasia 09/12/2023   HCAP (healthcare-associated pneumonia) 09/04/2023   History of ESBL E. coli UTI 06/2023 09/04/2023   Esophagitis 09/04/2023   Acute cystitis 09/04/2023   Sepsis (HCC) 09/04/2023   Hypokalemia 07/16/2023   Seizure disorder (HCC) 07/11/2023   History of alcohol use disorder 05/31/2023   Goals of care, counseling/discussion 04/03/2023   Malnutrition of moderate degree 04/03/2023   Stroke (HCC) 03/27/2023   Stenosis of left carotid artery s/p endarterectomy 02/20/2023   Altered mental status 02/20/2023   Acute stroke due to ischemia (HCC) 02/17/2023   Stroke-like symptom 02/16/2023   Leukocytosis 02/16/2023   Anemia of chronic disease 07/22/2022   Protein-calorie malnutrition, severe 05/28/2022   Dysphagia as late effect of cerebrovascular accident (CVA) 05/28/2022   Acute metabolic encephalopathy 05/27/2022   History of stroke 05/27/2022   Aspiration pneumonia (HCC) 04/23/2022   Nicotine dependence  Elevated troponin    Acute CVA (cerebrovascular accident) (HCC) 05/07/2021   Non compliance w medication regimen 05/07/2021   Dementia (HCC) 05/07/2021   AF (paroxysmal atrial fibrillation) (HCC) 05/07/2021   HTN (hypertension) 11/29/2020   Alcohol abuse 11/29/2020   AKI  (acute kidney injury) (HCC) 11/29/2020   Atrial flutter with rapid ventricular response (HCC) 11/29/2020   Seizures (HCC) 02/22/2020   Hypomagnesemia 10/29/2019   Pubic ramus fracture (HCC) 07/29/2019   Acetabular fracture (HCC) 07/29/2019   HLD (hyperlipidemia) 07/29/2019   GERD (gastroesophageal reflux disease) 07/29/2019   UTI (urinary tract infection) 07/29/2019   Vitamin D deficiency 09/29/2015    Past Surgical History: Past Surgical History:  Procedure Laterality Date   ENDARTERECTOMY Left 04/02/2023   Procedure: ENDARTERECTOMY CAROTID;  Surgeon: Renford Dills, MD;  Location: [facility] ORS;  Service: Vascular;  Laterality: Left;   skin cyst removal      Allergies: No Known Allergies  Home Medications: Medications Prior to Admission  Medication Sig Dispense Refill Last Dose   acetaminophen (TYLENOL) 500 MG tablet Take 1,000 mg by mouth 2 (two) times daily as needed for moderate pain, fever or headache.   unk   albuterol (PROVENTIL) (2.5 MG/3ML) 0.083% nebulizer solution Take 3 mLs (2.5 mg total) by nebulization every 6 (six) hours as needed for wheezing or shortness of breath. 75 mL 12 unk   amLODipine (NORVASC) 10 MG tablet Take 1 tablet (10 mg total) by mouth daily.   09/11/2023   apixaban (ELIQUIS) 5 MG TABS tablet Take 1 tablet (5 mg total) by mouth 2 (two) times daily. 60 tablet 0 09/11/2023 at 1600   atorvastatin (LIPITOR) 20 MG tablet Take 20 mg by mouth every evening.   09/11/2023   bisacodyl (DULCOLAX) 5 MG EC tablet Take 1 tablet (5 mg total) by mouth daily as needed for severe constipation.   unk   levETIRAcetam (KEPPRA) 500 MG tablet Take 1 tablet (500 mg total) by mouth 2 (two) times daily.   09/11/2023   ondansetron (ZOFRAN-ODT) 4 MG disintegrating tablet Take 4 mg by mouth every 8 (eight) hours as needed for nausea or vomiting.   unk   pantoprazole (PROTONIX) 40 MG tablet Take 1 tablet (40 mg total) by mouth daily.   09/11/2023   senna-docusate (SENOKOT-S) 8.6-50 MG  tablet Take 1 tablet by mouth at bedtime.   09/11/2023   traZODone (DESYREL) 100 MG tablet Take 1 tablet (100 mg total) by mouth at bedtime.   09/11/2023   vitamin B-12 (CYANOCOBALAMIN) 500 MCG tablet Take 500 mcg by mouth daily.   09/11/2023   ascorbic acid (VITAMIN C) 500 MG tablet Take 500 mg by mouth daily. (Patient not taking: Reported on 09/12/2023)   Not Taking   food thickener (SIMPLYTHICK, HONEY/LEVEL 3/MODERATELY THICK,) LIQD Take 1 packet by mouth as needed. 100 packet 0    Nebulizers (COMPRESSOR/NEBULIZER) MISC Use as directed for nebulized medications 1 each 0    tamsulosin (FLOMAX) 0.4 MG CAPS capsule Take 1 capsule (0.4 mg total) by mouth daily after breakfast. (Patient not taking: Reported on 09/12/2023)   Not Taking   Home medication reconciliation was completed with the patient.   Scheduled Inpatient Medications:    sodium chloride   Intravenous Once   atorvastatin  20 mg Oral QPM   vitamin B-12  500 mcg Oral Daily   enoxaparin (LOVENOX) injection  40 mg Subcutaneous Q24H   levETIRAcetam  500 mg Oral BID   pantoprazole (PROTONIX) IV  40 mg  Intravenous Q12H   traZODone  100 mg Oral QHS    Continuous Inpatient Infusions:    PRN Inpatient Medications:  acetaminophen, albuterol, ondansetron  Family History: family history includes Cancer in his brother; Stroke in his mother.  The patient's family history is negative for inflammatory bowel disorders, GI malignancy, or solid organ transplantation.  Social History:   reports that he quit smoking about 21 months ago. His smoking use included cigarettes. He started smoking about 69 years ago. He has a 34 pack-year smoking history. He has never used smokeless tobacco. He reports that he does not currently use alcohol. He reports that he does not use drugs. The patient denies ETOH, tobacco, or drug use.   Review of Systems:  Unable to obtain 2/2 encephalopathy/dysarthria/expressive aphasia    Physical Examination: BP (!)  118/57 (BP Location: Left Arm)   Pulse (!) 55   Temp 98 F (36.7 C) (Oral)   Resp 13   Ht 5\' 2"  (1.575 m)   Wt 65.4 kg   SpO2 100%   BMI 26.37 kg/m  Gen: NAD, alert, not oriented to day, month, or year.  HEENT: PEERLA, EOMI, Neck: supple, no JVD or thyromegaly Chest: CTA bilaterally, no wheezes, crackles, or other adventitious sounds CV: RRR, no m/g/c/r Abd: soft, NT, ND, +BS in all four quadrants; no HSM, guarding, ridigity, or rebound tenderness Ext: no edema, well perfused with 2+ pulses, Skin: no rash or lesions noted Lymph: no LAD  Data: Lab Results  Component Value Date   WBC 8.0 09/12/2023   HGB 7.1 (L) 09/13/2023   HCT 21.7 (L) 09/12/2023   MCV 92.7 09/12/2023   PLT 252 09/12/2023   Recent Labs  Lab 09/12/23 0853 09/12/23 1620 09/13/23 0525  HGB 8.6* 7.0* 7.1*   Lab Results  Component Value Date   NA 142 09/12/2023   K 3.7 09/12/2023   CL 108 09/12/2023   CO2 25 09/12/2023   BUN 18 09/12/2023   CREATININE 0.92 09/12/2023   Lab Results  Component Value Date   ALT 19 09/12/2023   AST 26 09/12/2023   ALKPHOS 74 09/12/2023   BILITOT 0.6 09/12/2023   Recent Labs  Lab 09/12/23 0853  INR 1.4*    Assessment/Plan:  82 y/o Caucasian male with a PMH of HTN, HLD, COPD, hx of seizure disorder, dementia, hx of CVA, GERD, and PAF on chronic anticoagulation with Eliquis presented to the [facility] ED via EMS 10/18 for chief complaint of coffee-ground emesis concerning for UGIB. GI consulted for further evaluation and management.   Coffee-ground emesis suspected 2/2 UGIB - DDx includes peptic ulcer disease, gastritis, esophagitis, AVMs, Dieulafoy's lesion, GAVE, duodenitis, neoplasm, polyp, etc  Aspiration pneumonia   Hx of CVA c/b dysphagia  PAF on chronic anticoagulation - last dose Eliquis Thursday PM  COPD  Hx of seizure disorder  DNR status   Recommendations:  - Maintain 2 large bore IVs for access - Continue to monitor serial H&H. Transfuse for  Hgb <7.0.  - No signs of overt GI bleeding at this time. Continue to monitor for signs of GIB. - Continue Protonix 40 mg IV BID for gastric protection  - Continue supportive care per primary team - Continue medical management of aspiration pneumonia per primary team - Agree with SLP dietary recommendations - Hold anticoagulation  - Avoid NSAIDs - Recommend palliative care consultation to update goal of care - I called patient's caregiver and POA to discuss plan of care. We discussed that EGD Is  high-risk given his current situation (stroke work-up, aspiration pneumonia, dementia, age, etc). There is no indication for emergent EGD at this time as he is hemodynamically stable with no signs of overt bleeding.  - Defer EGD for now. Consider EGD when clinically feasible after respiratory and neurologic status improves.  - Follow-up on results of MRI brain. Appreciate Neurology recs.  - GI following along with you   Thank you for the consult. Please call with questions or concerns.  Gilda Crease, PA-C [facility] [phone]

## 2023-09-13 NOTE — Progress Notes (Signed)
Progress Note   Patient: Richard Carter YQI:[id number] DOB: Mar 27, 1941 DOA: 09/12/2023     1 DOS: the patient was seen and examined on 09/13/2023   Brief hospital course: Richard Carter is a 82 y.o. male with medical history significant of CVA with residual dysphagia, on pured and honey thick liquid, seizure disorder, dementia, atrial fibrillation on Eliquis, essential hypertension, dyslipidemia, COPD, who was brought back to the hospital today for coffee-ground emesis.  Patient received 1 unit of PRBC on 10/19.  Seen by neurology, MRI obtained.   Principal Problem:   Acute upper GI bleed Active Problems:   Dysphagia as late effect of cerebrovascular accident (CVA)   AF (paroxysmal atrial fibrillation) (HCC)   History of stroke   Dementia (HCC)   Anemia of chronic disease   Expressive aphasia   Assessment and Plan: Coffee-ground emesis. Chronic anemia with iron deficiency. Acute blood loss anemia. Patient came to the hospital with nausea vomiting and coffee-ground emesis.  Hemoglobin still stable.  He has some chronic iron deficiency based on recent iron study. This most likely is caused by gastritis, possible esophagitis.  Will hold off Eliquis, continue monitor hemoglobin.   Patient is started on IV Protonix, monitor hemoglobin.  Hemoglobin dropped down to 7.0, transfuse 1 unit PRBC. Seen by GI, currently not considering EGD.   Expressive aphasia. History of stroke with dysphagia. It is possible patient had a stroke, symptoms started yesterday, not a thrombolytic candidate.  CT head without acute changes.   Seen by neurology, MRI's pending.  Discussed with Dr. Otelia Limes, patient previously had a significant speech abnormality when he got sick, not sure he had a stroke this time. Also discussed with speech therapy, continue current dysphagia diet with honey thickened liquid.  Hypothermia. Could be stroke versus infection. Continue to follow.   Possible aspiration pneumonia  of the left lower lobe. Most likely this is from vomiting, I reviewed patient chest x-ray, personally reviewed the image.  Patient has mild infiltrates.  Repeated procalcitonin level today, still less than 0.1, patient does not have bacterial pneumonia.  Most likely chemical pneumonitis from aspiration.  Discontinue IV antibiotics.   Paroxysmal atrial fibrillation. Reviewed the patient EKG, currently patient is sinus rhythm.  Hold off anticoagulation due to coffee-ground emesis.   Essential hypertension. Continue some of the home medicines.   COPD. No exacerbation.   Dementia without behavioral disturbance. Continue to follow.   Seizure disorder. Continue Keppra.   Overweight with BMI 26.37.         Subjective:  Patient is confused, no short of breath or hypoxia.  Physical Exam: Vitals:   09/13/23 0428 09/13/23 0845 09/13/23 1117 09/13/23 1148  BP: (!) 118/57 116/63 (!) 109/55 (!) 108/56  Pulse: (!) 55 (!) 50 (!) 55 (!) 56  Resp:  19 18 19   Temp: 98 F (36.7 C) 98.6 F (37 C) 97.6 F (36.4 C) 98 F (36.7 C)  TempSrc: Oral  Oral Oral  SpO2: 100% 96% 94% 94%  Weight:      Height:       General exam: Appears calm and comfortable  Respiratory system: Clear to auscultation. Respiratory effort normal. Cardiovascular system: S1 & S2 heard, RRR. No JVD, murmurs, rubs, gallops or clicks. No pedal edema. Gastrointestinal system: Abdomen is nondistended, soft and nontender. No organomegaly or masses felt. Normal bowel sounds heard. Central nervous system: Alert and oriented x2.  Speech slurred. Extremities: Symmetric 5 x 5 power. Skin: No rashes, lesions or ulcers Psychiatry: Mood &  affect appropriate.    Data Reviewed:  Lab results reviewed.  Family Communication: POA updated at the bedside.  Disposition: Status is: Inpatient Remains inpatient appropriate because: Severity of disease, inpatient procedure.     Time spent: 35 minutes  Author: Marrion Coy,  MD 09/13/2023 12:20 PM  For on call review [email or web address].

## 2023-09-13 NOTE — Progress Notes (Addendum)
Patient is alert and oriented with forgetfulness.one unit  PRBC transfusion done.patient tolerated well. No any sign of transfusion  reaction seen.plan of care is ongoing.

## 2023-09-14 DIAGNOSIS — K922 Gastrointestinal hemorrhage, unspecified: Secondary | ICD-10-CM | POA: Diagnosis not present

## 2023-09-14 DIAGNOSIS — R4701 Aphasia: Secondary | ICD-10-CM | POA: Diagnosis not present

## 2023-09-14 DIAGNOSIS — F0390 Unspecified dementia without behavioral disturbance: Secondary | ICD-10-CM | POA: Diagnosis not present

## 2023-09-14 DIAGNOSIS — I69391 Dysphagia following cerebral infarction: Secondary | ICD-10-CM | POA: Diagnosis not present

## 2023-09-14 LAB — TYPE AND SCREEN
ABO/RH(D): O POS
Antibody Screen: NEGATIVE
Unit division: 0

## 2023-09-14 LAB — BPAM RBC
Blood Product Expiration Date: 202411162359
ISSUE DATE / TIME: 202410191127
Unit Type and Rh: 5100

## 2023-09-14 LAB — HEMOGLOBIN: Hemoglobin: 9 g/dL — ABNORMAL LOW (ref 13.0–17.0)

## 2023-09-14 MED ORDER — FERROUS SULFATE 325 (65 FE) MG PO TBEC: 325.0000 mg | DELAYED_RELEASE_TABLET | Freq: Every day | ORAL | 0 refills | Status: DC

## 2023-09-14 MED ORDER — APIXABAN 2.5 MG PO TABS: 2.5000 mg | ORAL_TABLET | Freq: Two times a day (BID) | ORAL | 0 refills | Status: DC

## 2023-09-14 MED ORDER — PANTOPRAZOLE SODIUM 40 MG PO TBEC: 40.0000 mg | DELAYED_RELEASE_TABLET | Freq: Two times a day (BID) | ORAL | Status: AC

## 2023-09-14 NOTE — Plan of Care (Signed)
Patient is alert and oriented x 1. Discharge instruction given to [employer] [facility]). No any questions or concerns at this time. Problem: Fluid Volume: Goal: Hemodynamic stability will improve Outcome: Completed/Met   Problem: Clinical Measurements: Goal: Diagnostic test results will improve Outcome: Completed/Met Goal: Signs and symptoms of infection will decrease Outcome: Completed/Met   Problem: Respiratory: Goal: Ability to maintain adequate ventilation will improve Outcome: Completed/Met   Problem: Education: Goal: Knowledge of disease or condition will improve Outcome: Completed/Met Goal: Knowledge of secondary prevention will improve (MUST DOCUMENT ALL) Outcome: Completed/Met Goal: Knowledge of patient specific risk factors will improve Loraine Leriche N/A or DELETE if not current risk factor) Outcome: Completed/Met   Problem: Ischemic Stroke/TIA Tissue Perfusion: Goal: Complications of ischemic stroke/TIA will be minimized Outcome: Completed/Met   Problem: Coping: Goal: Will verbalize positive feelings about self Outcome: Completed/Met Goal: Will identify appropriate support needs Outcome: Completed/Met   Problem: Health Behavior/Discharge Planning: Goal: Ability to manage health-related needs will improve Outcome: Completed/Met Goal: Goals will be collaboratively established with patient/family Outcome: Completed/Met   Problem: Self-Care: Goal: Ability to participate in self-care as condition permits will improve Outcome: Completed/Met Goal: Verbalization of feelings and concerns over difficulty with self-care will improve Outcome: Completed/Met Goal: Ability to communicate needs accurately will improve Outcome: Completed/Met   Problem: Nutrition: Goal: Risk of aspiration will decrease Outcome: Completed/Met Goal: Dietary intake will improve Outcome: Completed/Met   Problem: Activity: Goal: Ability to tolerate increased activity will improve Outcome:  Completed/Met   Problem: Clinical Measurements: Goal: Ability to maintain a body temperature in the normal range will improve Outcome: Completed/Met   Problem: Respiratory: Goal: Ability to maintain adequate ventilation will improve Outcome: Completed/Met Goal: Ability to maintain a clear airway will improve Outcome: Completed/Met   Problem: Education: Goal: Knowledge of General Education information will improve Description: Including pain rating scale, medication(s)/side effects and non-pharmacologic comfort measures Outcome: Completed/Met   Problem: Health Behavior/Discharge Planning: Goal: Ability to manage health-related needs will improve Outcome: Completed/Met   Problem: Clinical Measurements: Goal: Ability to maintain clinical measurements within normal limits will improve Outcome: Completed/Met Goal: Will remain free from infection Outcome: Completed/Met Goal: Diagnostic test results will improve Outcome: Completed/Met Goal: Respiratory complications will improve Outcome: Completed/Met Goal: Cardiovascular complication will be avoided Outcome: Completed/Met   Problem: Activity: Goal: Risk for activity intolerance will decrease Outcome: Completed/Met   Problem: Nutrition: Goal: Adequate nutrition will be maintained Outcome: Completed/Met   Problem: Coping: Goal: Level of anxiety will decrease Outcome: Completed/Met   Problem: Elimination: Goal: Will not experience complications related to bowel motility Outcome: Completed/Met Goal: Will not experience complications related to urinary retention Outcome: Completed/Met   Problem: Pain Managment: Goal: General experience of comfort will improve Outcome: Completed/Met   Problem: Safety: Goal: Ability to remain free from injury will improve Outcome: Completed/Met   Problem: Skin Integrity: Goal: Risk for impaired skin integrity will decrease Outcome: Completed/Met

## 2023-09-14 NOTE — Plan of Care (Signed)
MRI brain reveals no evidence of acute intracranial abnormality. Noted are remote infarcts, chronic microvascular ischemic disease and cerebral atrophy.   A/R:  82 y.o. male with a PMHx of stroke with residual dysphagia, seizures (on Keppra 500 mg BID at home), dementia, atrial fibrillation (on Eliquis), HTN, dyslipidemia, alcohol abuse and piror left CEA who presented to the ED on Friday for coffee-ground emesis and diffuse weakness with difficulty walking and talking normally. He had been discharged from the hospital 3 days previously after treatment for a UTI.   - MRI confirms that the patient's presentation is not due to a stroke.  - Neurological deficits on presentation most likely were secondary to diffuse fatigue and confusion precipitated by an intercurrent illness in this patient with dementia, prior stroke and decreased neurological reserve.  - Patient previously had a significant speech abnormality when he got sick.  - Management of GIB and possible aspiration pneumonia/chemical pneumonitis per primary team.  - No further neurological recommendations. Neurohospitalist service will sign off. Please call if there are additional questions.   Electronically signed: Dr. Caryl Pina

## 2023-09-14 NOTE — TOC Transition Note (Addendum)
Transition of Care [facility]) - CM/SW Discharge Note   Patient Details  Name: Richard Carter MRN: [id number] Date of Birth: [birth year]  Transition of Care [facility]) CM/SW Contact:  Liliana Cline, LCSW Phone Number: 09/14/2023, 11:32 AM   Clinical Narrative:    [name] with [facility] that patient will DC home today.  New referral made to [place] with [facility] for OP Palliative Care per POA's request.  11:35- Called POA [name] back to confirm patient has a wheelchair at home per therapy recommendations. He confirms patient already has a wheelchair at home.   Final next level of care: Home w Home Health Services Barriers to Discharge: Barriers Resolved   Patient Goals and CMS Choice CMS Medicare.gov Compare Post Acute Care list provided to:: Patient Represenative (must comment) Choice offered to / list presented to : [name] POA / Guardian  Discharge Placement                    Name of family member notified: [name] - POA Patient and family notified of of transfer: 09/14/23  Discharge Plan and Services Additional resources added to the After Visit Summary for                            [name] Arranged: PT, OT, Speech Therapy HH Agency: [facility] Date [facility] Agency Contacted: 09/14/23   Representative spoke with at [facility] Agency: [name]  Social Determinants of Health (SDOH) Interventions SDOH Screenings   Food Insecurity: No Food Insecurity (09/13/2023)  Housing: Patient Declined (09/13/2023)  Transportation Needs: No Transportation Needs (09/13/2023)  Utilities: Not At Risk (09/13/2023)  Financial Resource Strain: Low Risk  (08/11/2023)   Received from [facility]  Tobacco Use: Medium Risk (08/11/2023)   Received from [facility]     Readmission Risk Interventions    09/13/2023    3:30 PM 09/05/2023    1:05 PM 04/21/2022    1:55 PM  Readmission Risk Prevention Plan  Post Dischage Appt   Complete  Medication  Screening   Complete  Transportation Screening Complete Complete Complete  Medication Review [employer]) Complete Complete   PCP or Specialist appointment within 3-5 days of discharge Complete --   HRI or Home Care Consult Complete Complete   SW Recovery Care/Counseling Consult Complete Complete   Palliative Care Screening Not Applicable Not Applicable   Skilled Nursing Facility Complete Not Applicable

## 2023-09-14 NOTE — Progress Notes (Signed)
[facility] OutPatient Palliative Referral  New referral for palliative care services upon discharge from [facility].  Patient discharged prior to meeting with patient/family.  Referral sent to referral intake.  Thank you for allowing participation in this patient's care.  Norris Cross, RN Nurse Liaison [phone]

## 2023-09-14 NOTE — Discharge Summary (Signed)
Physician Discharge Summary   Patient: Richard Carter MRN: [id number] DOB: June 02, 1941  Admit date:     09/12/2023  Discharge date: 09/14/23  Discharge Physician: Richard Carter   PCP: Richard Ivan, MD   Recommendations at discharge:    Follow-up with PCP in 1 week. Follow-up with GI as previously scheduled, patient has appointment in December. Check a CBC at next office visit. Refer to palliative care for poor prognosis.  Discharge Diagnoses: Principal Problem:   Acute upper GI bleed Active Problems:   Dysphagia as late effect of cerebrovascular accident (CVA)   AF (paroxysmal atrial fibrillation) (HCC)   History of stroke   Dementia (HCC)   Anemia of chronic disease   Expressive aphasia  Resolved Problems:   * No resolved hospital problems. *  Hospital Course: Richard Carter is a 82 y.o. male with medical history significant of CVA with residual dysphagia, on pured and honey thick liquid, seizure disorder, dementia, atrial fibrillation on Eliquis, essential hypertension, dyslipidemia, COPD, who was brought back to the hospital today for coffee-ground emesis.  Patient received 1 unit of PRBC on 10/19.  Seen by neurology, MRI obtained, did not show any evidence stroke. Patient also had no additional nausea vomiting or coffee-ground emesis.  Hemoglobin went up to 9.0.  Patient has been seen by GI, not considering for EGD.  Will/schedule follow-up with with GI. Per Richard Carter, "do not discontinue anticoagulation for GI reason". I will restart Eliquis at discharge.  Assessment and Plan: Coffee-ground emesis. Chronic anemia with iron deficiency. Acute blood loss anemia. Patient came to the hospital with nausea vomiting and coffee-ground emesis.  Patient hemoglobin dropped down to 7.0, received 1 unit PRBC. Patient also seen by GI, not considering any workup. Patient also started on Protonix twice a day.  Will continue that orally. Patient hemoglobin had went up to 9.0  today.  Discussed with GI, do not want discontinue Eliquis for GI reason.  Based on age, Eliquis will be decreased to 2.5 mg twice a day, Continue Protonix twice a day, also added iron supplement.   Expressive aphasia. History of stroke with dysphagia. It is possible patient had a stroke, symptoms started yesterday, not a thrombolytic candidate.  CT head without acute changes.   Discussed with Dr. Otelia Carter, patient previously had a significant speech abnormality when he got sick, not sure he had a stroke this time. Patient was also seen by speech therapy, continued on current diet with dysphagia 1 and a honey thick liquid. MRI scan did not show acute stroke.   Hypothermia. Improved.  Ruled in  aspiration pneumonitis of the left lower lobe. Most likely this is from vomiting, I reviewed patient chest x-ray, personally reviewed the image.  Patient has mild infiltrates.  Procalcitonin level less than 0.1 x 2 days, patient does not have bacterial pneumonia.  Most likely chemical pneumonitis from aspiration.  Discontinued IV antibiotics.   Paroxysmal atrial fibrillation. Reviewed the patient EKG, currently patient is sinus rhythm.  Restart Eliquis with reduced dose according to age.   Essential hypertension. Blood pressure medicine adjusted.   COPD. No exacerbation.   Dementia without behavioral disturbance. Resume home treatment.   Seizure disorder. Continue Keppra.   Overweight with BMI 26.37.          Consultants: Neurology and GI. Procedures performed: None  Disposition: Home Diet recommendation:  Discharge Diet Orders (From admission, onward)     Start     Ordered   09/14/23 0000  Diet general  Comments: Dys 1 with honey thick liquid.   09/14/23 1047           Dysphagia type 1 honey Liquid DISCHARGE MEDICATION: Allergies as of 09/14/2023   No Known Allergies      Medication List     STOP taking these medications    amLODipine 10 MG  tablet Commonly known as: NORVASC   ascorbic acid 500 MG tablet Commonly known as: VITAMIN C   tamsulosin 0.4 MG Caps capsule Commonly known as: FLOMAX       TAKE these medications    acetaminophen 500 MG tablet Commonly known as: TYLENOL Take 1,000 mg by mouth 2 (two) times daily as needed for moderate pain, fever or headache.   albuterol (2.5 MG/3ML) 0.083% nebulizer solution Commonly known as: PROVENTIL Take 3 mLs (2.5 mg total) by nebulization every 6 (six) hours as needed for wheezing or shortness of breath.   apixaban 2.5 MG Tabs tablet Commonly known as: ELIQUIS Take 1 tablet (2.5 mg total) by mouth 2 (two) times daily. What changed:  medication strength how much to take   atorvastatin 20 MG tablet Commonly known as: LIPITOR Take 20 mg by mouth every evening.   bisacodyl 5 MG EC tablet Commonly known as: DULCOLAX Take 1 tablet (5 mg total) by mouth daily as needed for severe constipation.   Compressor/Nebulizer Misc Use as directed for nebulized medications   ferrous sulfate 325 (65 FE) MG EC tablet Take 1 tablet (325 mg total) by mouth daily with breakfast.   food thickener Liqd Commonly known as: SIMPLYTHICK (HONEY/LEVEL 3/MODERATELY THICK) Take 1 packet by mouth as needed.   levETIRAcetam 500 MG tablet Commonly known as: KEPPRA Take 1 tablet (500 mg total) by mouth 2 (two) times daily.   ondansetron 4 MG disintegrating tablet Commonly known as: ZOFRAN-ODT Take 4 mg by mouth every 8 (eight) hours as needed for nausea or vomiting.   pantoprazole 40 MG tablet Commonly known as: Protonix Take 1 tablet (40 mg total) by mouth 2 (two) times daily. What changed: when to take this   senna-docusate 8.6-50 MG tablet Commonly known as: Senokot-S Take 1 tablet by mouth at bedtime.   traZODone 100 MG tablet Commonly known as: DESYREL Take 1 tablet (100 mg total) by mouth at bedtime.   vitamin B-12 500 MCG tablet Commonly known as: CYANOCOBALAMIN Take  500 mcg by mouth daily.        Follow-up Information     Richard Ivan, MD Follow up in 1 week(s).   Specialty: Family Medicine Contact information: [address] [place] [address] [phone]                Discharge Exam: Filed Weights   09/12/23 0846  Weight: 65.4 kg   General exam: Appears calm and comfortable  Respiratory system: Decreased breath sounds. Respiratory effort normal. Cardiovascular system: S1 & S2 heard, RRR. No JVD, murmurs, rubs, gallops or clicks. No pedal edema. Gastrointestinal system: Abdomen is nondistended, soft and nontender. No organomegaly or masses felt. Normal bowel sounds heard. Central nervous system: Alert and oriented x2. No focal neurological deficits. Extremities: Symmetric 5 x 5 power. Skin: No rashes, lesions or ulcers Psychiatry:  Mood & affect appropriate.    Condition at discharge: fair  The results of significant diagnostics from this hospitalization (including imaging, microbiology, ancillary and laboratory) are listed below for reference.   Imaging Studies: MR BRAIN WO CONTRAST  Result Date: 09/13/2023 CLINICAL DATA:  Neuro deficit, acute, stroke  suspected EXAM: MRI HEAD WITHOUT CONTRAST TECHNIQUE: Multiplanar, multiecho pulse sequences of the brain and surrounding structures were obtained without intravenous contrast. COMPARISON:  CT head October 18, 24. MRI head August 17, 24. FINDINGS: Brain: No acute infarction, hemorrhage, hydrocephalus, extra-axial collection or mass lesion. Remote infarcts in the right frontoparietal region and left frontal lobe. Patchy white matter T2/FLAIR hyperintensities, compatible with chronic microvascular ischemic disease. Cerebral atrophy. Vascular: Major arterial flow voids are maintained. Skull and upper cervical spine: Normal marrow signal. Sinuses/Orbits: Negative. IMPRESSION: 1. No evidence of acute intracranial abnormality. 2. Remote infarcts and chronic microvascular  ischemic disease. 3.  Cerebral atrophy (ICD10-G31.9). Electronically Signed   By: Feliberto Harts M.D.   On: 09/13/2023 20:13   CT ABDOMEN PELVIS W CONTRAST  Result Date: 09/12/2023 CLINICAL DATA:  Abdominal pain, acute, nonlocalized EXAM: CT ABDOMEN AND PELVIS WITH CONTRAST TECHNIQUE: Multidetector CT imaging of the abdomen and pelvis was performed using the standard protocol following bolus administration of intravenous contrast. RADIATION DOSE REDUCTION: This exam was performed according to the departmental dose-optimization program which includes automated exposure control, adjustment of the mA and/or kV according to patient size and/or use of iterative reconstruction technique. CONTRAST:  OMNIPAQUE IOHEXOL 300 MG/ML  SOLN COMPARISON:  09/04/2023 FINDINGS: Lower chest: Right basilar atelectasis or consolidation, increased. Aortic and coronary artery atherosclerosis. Moderate hiatal hernia. Hepatobiliary: No focal liver abnormality. Cholelithiasis. No pericholecystic inflammatory changes by CT. No biliary dilatation. Pancreas: Pancreatic head calcifications. No pancreatic ductal dilatation or surrounding inflammatory changes. Spleen: Normal in size without focal abnormality. Adrenals/Urinary Tract: Unremarkable adrenal glands. Stable bilateral renal cysts, no follow-up imaging recommended. Stable calcifications in the bilateral renal hila, likely vascular. No hydronephrosis. Unremarkable ureters. Mild urinary bladder wall thickening. Stomach/Bowel: Moderate hiatal hernia. No dilated loops of bowel. Normal appendix (series 2, image 57). Moderate volume stool throughout the colon. No focal bowel wall thickening or inflammatory changes. Vascular/Lymphatic: Dense aortoiliac atherosclerosis. No enlarged abdominal or pelvic lymph nodes. Reproductive: Mildly enlarged prostate gland. Other: No free fluid. No abdominopelvic fluid collection. No pneumoperitoneum. No abdominal wall hernia. Musculoskeletal:  Chronic L1 compression fracture. No new or acute bony findings. IMPRESSION: 1. No acute abdominopelvic findings. 2. Right basilar atelectasis or consolidation, increased. 3. Mild urinary bladder wall thickening, which may be secondary to chronic outlet obstruction or reflect cystitis. Correlate with urinalysis. 4. Cholelithiasis. 5. Moderate volume stool throughout the colon. 6. Aortic atherosclerosis (ICD10-I70.0). 7. Additional chronic and/or incidental findings, as above. Electronically Signed   By: Duanne Guess D.O.   On: 09/12/2023 16:49   CT HEAD WO CONTRAST ( )  Result Date: 09/12/2023 CLINICAL DATA:  Mental status change, unknown cause.  Vomiting. EXAM: CT HEAD WITHOUT CONTRAST TECHNIQUE: Contiguous axial images were obtained from the base of the skull through the vertex without intravenous contrast. RADIATION DOSE REDUCTION: This exam was performed according to the departmental dose-optimization program which includes automated exposure control, adjustment of the mA and/or kV according to patient size and/or use of iterative reconstruction technique. COMPARISON:  Head CT 09/04/2023. FINDINGS: Brain: No acute hemorrhage. Unchanged moderate chronic small-vessel disease with old infarcts along left middle frontal gyrus and right precentral gyrus. Cortical gray-white differentiation is otherwise preserved. Prominence of the ventricles and sulci within expected range for age. No hydrocephalus or extra-axial collection. No mass effect or midline shift. Vascular: No hyperdense vessel or unexpected calcification. Skull: No calvarial fracture or suspicious bone lesion. Skull base is unremarkable. Sinuses/Orbits: No acute finding. Other: None. IMPRESSION: 1. No acute intracranial abnormality.  2. Unchanged moderate chronic small-vessel disease with old infarcts along left middle frontal gyrus and right precentral gyrus. Electronically Signed   By: Orvan Falconer M.D.   On: 09/12/2023 11:50   DG Chest  Portable 1 View  Result Date: 09/12/2023 CLINICAL DATA:  Shortness of breath. EXAM: PORTABLE CHEST 1 VIEW COMPARISON:  09/04/2023. FINDINGS: Low lung volume. There are new left retrocardiac opacities, partially obscuring the left medial hemidiaphragm, concerning for left lung lower lobe atelectasis and/or consolidation. There is probable trace left pleural effusion. Bilateral lung fields are otherwise clear. Right lateral costophrenic angle is clear. Stable cardio-mediastinal silhouette. No acute osseous abnormalities. Multiple old healed bilateral rib fractures noted. The soft tissues are within normal limits. IMPRESSION: 1. Low lung volume. 2. New left lung lower lobe atelectasis and/or consolidation with probable trace left pleural effusion. Electronically Signed   By: Jules Schick M.D.   On: 09/12/2023 11:34   CT ABDOMEN PELVIS W CONTRAST  Result Date: 09/04/2023 CLINICAL DATA:  Bowel obstruction suspected Hemoptysis. EXAM: CT ABDOMEN AND PELVIS WITH CONTRAST TECHNIQUE: Multidetector CT imaging of the abdomen and pelvis was performed using the standard protocol following bolus administration of intravenous contrast. RADIATION DOSE REDUCTION: This exam was performed according to the departmental dose-optimization program which includes automated exposure control, adjustment of the mA and/or kV according to patient size and/or use of iterative reconstruction technique. CONTRAST:  75mL OMNIPAQUE IOHEXOL 350 MG/ML SOLN COMPARISON:  CT 06/30/2023 FINDINGS: Lower chest: Assessed on concurrent chest CT, reported separately. Hepatobiliary: No focal liver abnormality. Again seen gallstone. No abnormal gallbladder distension. Assessment for wall thickening is limited due to motion. No biliary dilatation. Pancreas: Few calcifications in the pancreatic head. No ductal dilatation or inflammation. Spleen: Normal in size without focal abnormality. Adrenals/Urinary Tract: No adrenal nodule. No hydronephrosis or  perinephric edema. Homogeneous renal enhancement with symmetric excretion on delayed phase imaging. Unchanged bilateral renal cysts and low-density lesions too small to characterize. No further follow-up imaging is recommended. Circumferential bladder wall thickening. Stomach/Bowel: Hiatal hernia is at least moderate in size. No small bowel dilatation. No bowel obstruction. Cecum is low-lying in the mid pelvis. Normal appendix. Small to moderate colonic stool burden. Occasional colonic diverticula. High density associated with the distal sigmoid colon may represent retained barium within diverticula. Vascular/Lymphatic: Dense aortic and branch atherosclerosis. No aortic aneurysm. Portal vein is patent. No acute vascular findings. No adenopathy. Reproductive: Prominent prostate. Stable asymmetric calcification in the left seminal vesicle. Other: No ascites or free air. Musculoskeletal: Chronic L1 compression deformity, stable. Stable L3-L4 and L4-L5 degenerative disc disease. No free air, free fluid, or intra-abdominal fluid collection. IMPRESSION: 1. No bowel obstruction. 2. Circumferential bladder wall thickening may represent cystitis. Recommend correlation with urinalysis. 3. Additional stable chronic findings as described. Aortic Atherosclerosis (ICD10-I70.0). Electronically Signed   By: Narda Rutherford M.D.   On: 09/04/2023 19:17   CT Angio Chest PE W and/or Wo Contrast  Result Date: 09/04/2023 CLINICAL DATA:  Pulmonary embolism (PE) suspected, high prob 82 year old with shortness of breath. EXAM: CT ANGIOGRAPHY CHEST WITH CONTRAST TECHNIQUE: Multidetector CT imaging of the chest was performed using the standard protocol during bolus administration of intravenous contrast. Multiplanar CT image reconstructions and MIPs were obtained to evaluate the vascular anatomy. RADIATION DOSE REDUCTION: This exam was performed according to the departmental dose-optimization program which includes automated exposure  control, adjustment of the mA and/or kV according to patient size and/or use of iterative reconstruction technique. CONTRAST:  75mL OMNIPAQUE IOHEXOL 350 MG/ML SOLN  COMPARISON:  Radiograph earlier today. FINDINGS: Cardiovascular: There are no filling defects within the pulmonary arteries to suggest pulmonary embolus. Aortic atherosclerosis without acute aortic findings or aneurysm. Heart size is normal. There are coronary artery calcifications. No significant pericardial effusion. Mediastinum/Nodes: Moderate-sized hiatal hernia. Wall thickening of the mid and distal esophagus. Small right hilar and mediastinal lymph nodes are not enlarged by size criteria, typically reactive. Lungs/Pleura: Mild bronchial thickening. Ground-glass opacities in the dependent right lower lobe may be hypoventilatory but are nonspecific. No confluent airspace disease. No pleural fluid. No suspicious pulmonary nodule or mass. Calcified granuloma at the left lung base. Upper Abdomen: Assessed on concurrent abdominopelvic CT, reported separately. Musculoskeletal: There are no acute or suspicious osseous abnormalities. There are remote bilateral rib fractures. Right gynecomastia. Review of the MIP images confirms the above findings. IMPRESSION: 1. No pulmonary embolus. 2. Mild bronchial thickening. Ground-glass opacities in the dependent right lower lobe may be hypoventilatory, non but nonspecific. Infection is not entirely excluded. 3. Moderate-sized hiatal hernia. Wall thickening of the mid and distal esophagus, can be seen with reflux or esophagitis. Aortic Atherosclerosis (ICD10-I70.0). Electronically Signed   By: Narda Rutherford M.D.   On: 09/04/2023 19:12   CT Head Wo Contrast  Result Date: 09/04/2023 CLINICAL DATA:  Head trauma, repeat vomiting (Age 39-64y) EXAM: CT HEAD WITHOUT CONTRAST TECHNIQUE: Contiguous axial images were obtained from the base of the skull through the vertex without intravenous contrast. RADIATION DOSE  REDUCTION: This exam was performed according to the departmental dose-optimization program which includes automated exposure control, adjustment of the mA and/or kV according to patient size and/or use of iterative reconstruction technique. COMPARISON:  MRI head 07/12/2023.  CT head 06/30/2023. FINDINGS: Brain: Remote left frontal infarct. Additional patchy white matter hypodensities, compatible with chronic microvascular ischemic change. No evidence of acute hemorrhage, mass lesion, midline shift or hydrocephalus. Vascular: Calcific atherosclerosis. Skull: No acute fracture. Sinuses/Orbits: Clear sinuses.  No acute orbital findings. Other: No mastoid effusions. IMPRESSION: 1. No evidence of acute intracranial abnormality. 2. Chronic ischemic injury and cerebral atrophy (ICD10-G31.9). Electronically Signed   By: Feliberto Harts M.D.   On: 09/04/2023 19:10   DG Chest 2 View  Result Date: 09/04/2023 CLINICAL DATA:  Shortness of breath.  Hemoptysis. EXAM: CHEST - 2 VIEW COMPARISON:  07/11/2023. FINDINGS: Low lung volume. Probable atelectatic changes at the lung bases. Bilateral lung fields are clear. No acute consolidation, lung mass or lung collapse. Bilateral costophrenic angles are clear. Normal cardio-mediastinal silhouette. No acute osseous abnormalities. Multiple old healed bilateral rib fractures noted. The soft tissues are within normal limits. IMPRESSION: 1. Low lung volume. No active cardiopulmonary disease. Electronically Signed   By: Jules Schick M.D.   On: 09/04/2023 13:57    Microbiology: Results for orders placed or performed during the hospital encounter of 09/12/23  Blood culture (routine x 2)     Status: None (Preliminary result)   Collection Time: 09/12/23 10:38 AM   Specimen: BLOOD  Result Value Ref Range Status   Specimen Description BLOOD BLOOD RIGHT HAND  Final   Special Requests   Final    BOTTLES DRAWN AEROBIC AND ANAEROBIC Blood Culture adequate volume   Culture   Final     NO GROWTH 2 DAYS Performed at [facility], [address], [place], [place] [address]    Report Status PENDING  Incomplete  Blood culture (routine x 2)     Status: None (Preliminary result)   Collection Time: 09/12/23 10:48 AM  Specimen: BLOOD  Result Value Ref Range Status   Specimen Description BLOOD BLOOD LEFT ARM  Final   Special Requests   Final    BOTTLES DRAWN AEROBIC AND ANAEROBIC Blood Culture results may not be optimal due to an excessive volume of blood received in culture bottles   Culture   Final    NO GROWTH 2 DAYS Performed at [facility], [address] [place], [place] [address]    Report Status PENDING  Incomplete    Labs: CBC: Recent Labs  Lab 09/08/23 0400 09/09/23 0455 09/10/23 0431 09/12/23 0853 09/12/23 1620 09/13/23 0525 09/13/23 1657 09/14/23 0555  WBC 7.4 7.5 6.7 10.0 8.0  --   --   --   NEUTROABS  --  4.3 3.5 7.5  --   --   --   --   HGB 7.5* 7.3* 7.8* 8.6* 7.0* 7.1* 8.9* 9.0*  HCT 23.7* 23.3* 24.0* 27.0* 21.7*  --   --   --   MCV 92.6 92.5 92.3 93.4 92.7  --   --   --   PLT 248 263 272 322 252  --   --   --    Basic Metabolic Panel: Recent Labs  Lab 09/08/23 0400 09/09/23 0455 09/10/23 0431 09/12/23 0853 09/12/23 1620  NA 142 138 140 142  --   K 3.9 3.8 4.2 3.7  --   CL 113* 111 112* 108  --   CO2 23 22 23 25   --   GLUCOSE 81 85 79 94  --   BUN 26* 22 23 18   --   CREATININE 0.92 0.99 1.00 0.94 0.92  CALCIUM 9.1 8.9 9.1 9.5  --   MG  --   --  1.9  --   --   PHOS  --   --  3.2  --   --    Liver Function Tests: Recent Labs  Lab 09/08/23 0400 09/12/23 0853  AST 13* 26  ALT 10 19  ALKPHOS 59 74  BILITOT 0.3 0.6  PROT 5.7* 7.0  ALBUMIN 2.9* 3.7   CBG: Recent Labs  Lab 09/10/23 0753  GLUCAP 80    Discharge time spent: greater than 30 minutes.  Signed: Marrion Coy, MD [facility] 09/14/2023

## 2023-09-14 NOTE — Evaluation (Signed)
Occupational Therapy Evaluation Patient Details Name: Richard Carter MRN: [id number] DOB: 1941/09/12 Today's Date: 09/14/2023   History of Present Illness Pt is a 82 y.o. male with medical history significant of CVA with residual dysphagia, on pured and honey thick liquid, seizure disorder, dementia, atrial fibrillation on Eliquis, essential hypertension, dyslipidemia, COPD, who was brought back to the hospital today for coffee-ground emesis.   Clinical Impression   Pt was seen for OT/PT evaluation this date. Pt was alert throughout session ?orientation, limited verbal response to questions on this date. Prior to hospital admission, pt was requiring assistance with ADL/IADLs. Per chart review Pt caregiver provides set up for all ADLs, and max for peri care. Pt lives alone, in one level home. Pt 3-point cane in room, pt states he uses it at home. Pt is a poor historian, no caregiver present to confirm details.   Pt presents to acute OT demonstrating impaired ADL performance, activity tolerance and functional mobility (See OT problem list for additional functional deficits). Pt bed mobility completed with MINA for extra truck support. Pt required MIN A + 2 for STS with RW and amb to bathroom. Pt requiring verbal cues to keep feet within RW throughout amb. MAX peri care provided post BM. Pt required MOD + 2 for amb back to bed and verbal encouragement to continue. Pt able to roll side to side for sheet change with use of bed rails with supervisino. Pt son present at end of session. Pt would benefit from skilled OT services to address noted impairments and functional limitations (see below for any additional details) in order to maximize safety and independence while minimizing falls risk and caregiver burden. OT will follow acutely.     If plan is discharge home, recommend the following: Supervision due to cognitive status;Help with stairs or ramp for entrance;Assistance with  cooking/housework;Assistance with feeding;Assist for transportation;A lot of help with walking and/or transfers;A lot of help with bathing/dressing/bathroom;Direct supervision/assist for financial management    Functional Status Assessment  Patient has had a recent decline in their functional status and demonstrates the ability to make significant improvements in function in a reasonable and predictable amount of time.  Equipment Recommendations   (next venue of care)    Recommendations for Other Services       Precautions / Restrictions Precautions Precautions: Fall Restrictions Weight Bearing Restrictions: No      Mobility Bed Mobility Overal bed mobility: Needs Assistance Bed Mobility: Supine to Sit, Sit to Supine  Rolling: supervision   Supine to sit: Used rails, HOB elevated, Min assist Sit to supine: Mod assist, HOB elevated, Used rails   General bed mobility comments: MAX A to facilate upward scoot to [facility]    Transfers Overall transfer level: Needs assistance Equipment used: Rolling walker (2 wheels) Transfers: Sit to/from Stand Sit to Stand: Min assist, +2 physical assistance           General transfer comment: STS from toilet MINA + RW, STS from EOB MINA +2 + RW      Balance Overall balance assessment: Needs assistance Sitting-balance support: Feet supported, No upper extremity supported Sitting balance-Leahy Scale: Fair     Standing balance support: Bilateral upper extremity supported, Reliant on assistive device for balance Standing balance-Leahy Scale: Poor                             ADL either performed or assessed with clinical judgement   ADL Overall  ADL's : Needs assistance/impaired                     Lower Body Dressing: Maximal assistance;Bed level Lower Body Dressing Details (indicate cue type and reason): socks/SCDs Toilet Transfer: Ambulation;Rolling walker (2 wheels);Minimal assistance;+2 for physical  assistance;Moderate assistance Toilet Transfer Details (indicate cue type and reason): Initally MINA +2 going to BR, on the way back to bed MODA +2 Toileting- Clothing Manipulation and Hygiene: Maximal assistance;Sit to/from stand Toileting - Clothing Manipulation Details (indicate cue type and reason): Pt asked to be wiped, states Richard Carter (caregiver) usually cleans him after BM.     Functional mobility during ADLs: Rolling walker (2 wheels);Moderate assistance;;+2 for physical assistance, frequent vcs for technique        Vision Baseline Vision/History: 1 Wears glasses       Perception Perception: Within Functional Limits       Praxis Praxis: Impaired Praxis Impairment Details: Motor planning     Pertinent Vitals/Pain Pain Assessment Pain Assessment: PAINAD Breathing: normal Negative Vocalization: none Facial Expression: smiling or inexpressive Body Language: relaxed Consolability: no need to console PAINAD Score: 0     Extremity/Trunk Assessment Upper Extremity Assessment Upper Extremity Assessment: Generalized weakness   Lower Extremity Assessment Lower Extremity Assessment: Generalized weakness       Communication Communication Communication: Difficulty communicating thoughts/reduced clarity of speech Cueing Techniques: Verbal cues;Tactile cues   Cognition Arousal: Alert Behavior During Therapy: WFL for tasks assessed/performed Overall Cognitive Status: No family/caregiver present to determine baseline cognitive functioning Area of Impairment: Memory, Following commands, Safety/judgement, Problem solving                     Memory: Decreased short-term memory Following Commands: Follows one step commands with increased time Safety/Judgement: Decreased awareness of safety, Decreased awareness of deficits   Problem Solving: Difficulty sequencing, Requires verbal cues, Slow processing General Comments: Pt required motivation to participate in session      General Comments  Pt frequently metioned that he was hungry throughout session.    Exercises Other Exercises Other Exercises: Edu: Safe ADL completion, fall prevention, DME amb technique   Shoulder Instructions      Home Living Family/patient expects to be discharged to:: Private residence Living Arrangements: Alone Available Help at Discharge: Other (Comment) (assistance from [place] (unsure of extent of assist that can be provided)) Type of Home: Mobile home Home Access: Stairs to enter [employer] of Steps: 2 Entrance Stairs-Rails: Left Home Layout: One level     Bathroom Shower/Tub: Producer, television/film/video: Handicapped height Bathroom Accessibility: Yes   Home Equipment: Grab bars - tub/shower;Shower seat;Grab bars - Geophysicist/field seismologist (2 wheels);Wheelchair - manual, 3-point cane           Prior Functioning/Environment Prior Level of Function : Needs assist;History of Falls (last six months)             Mobility Comments: Per chart review "pt uses RW for household distances per chart review and patient reports ADLs Comments: able to perform ADLs with set up"  Pt is a poor historian         OT Problem List: Decreased strength;Decreased knowledge of use of DME or AE;Decreased cognition;Impaired balance (sitting and/or standing);Decreased safety awareness;Decreased activity tolerance      OT Treatment/Interventions: Self-care/ADL training;Therapeutic exercise;Patient/family education;Balance training;Therapeutic activities;DME and/or AE instruction;Energy conservation    OT Goals(Current goals can be found in the care plan section) Acute Rehab OT Goals Patient Stated  Goal: to return to PLOF OT Goal Formulation: With patient Time For Goal Achievement: 09/28/23 Potential to Achieve Goals: Good ADL Goals Pt Will Perform Grooming: with modified independence;sitting Pt Will Perform Lower Body Dressing: with mod  assist;sitting/lateral leans Pt Will Transfer to Toilet: ambulating;regular height toilet;with min assist Pt Will Perform Toileting - Clothing Manipulation and hygiene: with min assist;sit to/from stand  OT Frequency: Min 1X/week    Co-evaluation PT/OT/SLP Co-Evaluation/Treatment: Yes Reason for Co-Treatment: Complexity of the patient's impairments (multi-system involvement);To address functional/ADL transfers;For patient/therapist safety PT goals addressed during session: Mobility/safety with mobility;Proper use of DME;Balance OT goals addressed during session: ADL's and self-care;Proper use of Adaptive equipment and DME      AM-PAC OT "6 Clicks" Daily Activity     Outcome Measure Help from another person eating meals?: A Little Help from another person taking care of personal grooming?: A Lot Help from another person toileting, which includes using toliet, bedpan, or urinal?: A Lot Help from another person bathing (including washing, rinsing, drying)?: A Lot Help from another person to put on and taking off regular upper body clothing?: A Lot Help from another person to put on and taking off regular lower body clothing?: A Lot 6 Click Score: 13   End of Session Equipment Utilized During Treatment: Rolling walker (2 wheels);Gait belt  Activity Tolerance: Patient tolerated treatment well Patient left: with call bell/phone within reach;in bed;with bed alarm set;with family/visitor present  OT Visit Diagnosis: Unsteadiness on feet (R26.81);Other abnormalities of gait and mobility (R26.89);Muscle weakness (generalized) (M62.81)                Time: 1610-9604 OT Time Calculation (min): 29 min Charges:  OT General Charges $OT Visit: 1 Visit OT Evaluation $OT Eval Moderate Complexity: 1 Mod  [employer], OTS

## 2023-09-14 NOTE — Progress Notes (Signed)
Physical Therapy Evaluation Patient Details Name: Richard Carter MRN: [id number] DOB: 16-Jul-1941 Today's Date: 09/14/2023  History of Present Illness  Pt is a 82 y.o. male with medical history significant of CVA with residual dysphagia, on pured and honey thick liquid, seizure disorder, dementia, atrial fibrillation on Eliquis, essential hypertension, dyslipidemia, COPD, who was brought back to the hospital today for coffee-ground emesis.   Clinical Impression  Patient supine in bed upon arrival, agreeable to PT/OT co-evaluation with encouragement. Per chart review, PTA patient was able to ambulate short distance with use of RW and able to complete ADLs with set up assist. On evaluation this date, patient requiring increased assistance for mobility. Patient supine > sit requiring Min A +2 and increased time for completion. Min to Mod A +2 to stand and ambulate within room approx ~ 20 ft in room with use of RW. Pt with decreased safety awareness requiring multimodal cues for safety and proper use of AD. Vitals stable with mobility this date. Patient will benefit from acute skilled PT services to address impairments (see below for additional details) and to maximize functional mobility. Patient left with all needs in reach, bed alarm set and family member at bedside. Anticipate need for follow up PT services upon acute hospital discharge. Will continue to follow acutely      If plan is discharge home, recommend the following: A lot of help with walking and/or transfers;A lot of help with bathing/dressing/bathroom;Assist for transportation;Help with stairs or ramp for entrance   Can travel by private vehicle   No    Equipment Recommendations Other (comment) (TBD)  Recommendations for Other Services       Functional Status Assessment Patient has had a recent decline in their functional status and demonstrates the ability to make significant improvements in function in a reasonable and  predictable amount of time.     Precautions / Restrictions Precautions Precautions: Fall Restrictions Weight Bearing Restrictions: No      Mobility  Bed Mobility Overal bed mobility: Needs Assistance Bed Mobility: Supine to Sit, Sit to Supine, Rolling Rolling: Min assist   Supine to sit: Min assist, +2 for physical assistance Sit to supine: Min assist, +2 for physical assistance   General bed mobility comments: Pt require increased time for completion of bed mobility, with multimodal cues. Min A + 2 required for supine <> sit. Patient able to roll with Min A and use of rails    Transfers Overall transfer level: Needs assistance Equipment used: Rolling walker (2 wheels) Transfers: Sit to/from Stand Sit to Stand: Mod assist, +2 physical assistance           General transfer comment: Patient require Mod A +2 to stand from EOB, increased difficulty standing from low toilet seat height.    Ambulation/Gait Ambulation/Gait assistance: Mod assist, +2 physical assistance, Min assist Gait Distance (Feet): 20 Feet Assistive device: Rolling walker (2 wheels) Gait Pattern/deviations: Step-through pattern, Decreased step length - right, Decreased step length - left, Trunk flexed Gait velocity: Decreased     General Gait Details: Pt performed amb with Min to Mod A +2; initially Min A but with progressing fatigue began to require Mod A. Patient able to ambulate ~ 20 ft from bed to toilet, then back to bed. Patient with decreased safety awareness, trying to sit before aligned with toilet/bed requiring Max Verbal Cues and assistance. Pt with increasing difficulty staying aligned within RW, requiring cues for proper use and postural alignment with mobility  Stairs  Wheelchair Mobility     Tilt Bed    Modified Rankin (Stroke Patients Only)       Balance Overall balance assessment: Needs assistance Sitting-balance support: Feet supported Sitting balance-Leahy  Scale: Fair     Standing balance support: Reliant on assistive device for balance, During functional activity, Bilateral upper extremity supported Standing balance-Leahy Scale: Fair Standing balance comment: poor balance; forward flexed posture with heavy reliance on RW                             Pertinent Vitals/Pain Pain Assessment Pain Assessment: Faces Faces Pain Scale: No hurt    Home Living Family/patient expects to be discharged to:: Private residence Living Arrangements: Alone Available Help at Discharge: Other (Comment) (assistance from [place] (unsure of extent of assist that can be provided)) Type of Home: Mobile home Home Access: Stairs to enter Entrance Stairs-Rails: Left Entrance Stairs-Number of Steps: 2   Home Layout: One level Home Equipment: Grab bars - tub/shower;Shower seat;Grab bars - Geophysicist/field seismologist (2 wheels);Wheelchair - manual      Prior Function Prior Level of Function : Needs assist;History of Falls (last six months)             Mobility Comments: pt uses RW for household distances per chart review and patient reports ADLs Comments: able to perform ADLs with set up     Extremity/Trunk Assessment   Upper Extremity Assessment Upper Extremity Assessment: Generalized weakness    Lower Extremity Assessment Lower Extremity Assessment: Generalized weakness       Communication   Communication Communication: Difficulty communicating thoughts/reduced clarity of speech Cueing Techniques: Verbal cues;Tactile cues  Cognition Arousal: Alert Behavior During Therapy: WFL for tasks assessed/performed Overall Cognitive Status: No family/caregiver present to determine baseline cognitive functioning Area of Impairment: Memory, Safety/judgement, Problem solving                         Safety/Judgement: Decreased awareness of safety, Decreased awareness of deficits   Problem Solving: Slow processing General  Comments: Increased cues required and decreased awareness to safety with mobility        General Comments General comments (skin integrity, edema, etc.): Pt frequently metioned that he was hungry throughout session.    Exercises     Assessment/Plan    PT Assessment Patient needs continued PT services  PT Problem List Decreased strength;Pain;Decreased range of motion;Decreased activity tolerance;Decreased balance;Decreased mobility       PT Treatment Interventions DME instruction;Neuromuscular re-education;Gait training;Stair training;Patient/family education;Functional mobility training;Therapeutic activities;Therapeutic exercise;Balance training    PT Goals (Current goals can be found in the Care Plan section)  Acute Rehab PT Goals Patient Stated Goal: unable to participate PT Goal Formulation: Patient unable to participate in goal setting Time For Goal Achievement: 09/28/23 Potential to Achieve Goals: Fair    Frequency Min 1X/week     Co-evaluation PT/OT/SLP Co-Evaluation/Treatment: Yes Reason for Co-Treatment: Complexity of the patient's impairments (multi-system involvement);To address functional/ADL transfers;For patient/therapist safety PT goals addressed during session: Mobility/safety with mobility;Proper use of DME;Balance OT goals addressed during session: ADL's and self-care;Proper use of Adaptive equipment and DME       AM-PAC PT "6 Clicks" Mobility  Outcome Measure Help needed turning from your back to your side while in a flat bed without using bedrails?: A Little Help needed moving from lying on your back to sitting on the side of a flat bed without using bedrails?:  A Little Help needed moving to and from a bed to a chair (including a wheelchair)?: A Lot Help needed standing up from a chair using your arms (e.g., wheelchair or bedside chair)?: A Lot Help needed to walk in hospital room?: A Lot Help needed climbing 3-5 steps with a railing? : A Lot 6 Click  Score: 14    End of Session Equipment Utilized During Treatment: Gait belt Activity Tolerance: Patient tolerated treatment well Patient left: in bed;with bed alarm set;with family/visitor present;with call bell/phone within reach Nurse Communication: Mobility status PT Visit Diagnosis: Unsteadiness on feet (R26.81);Other abnormalities of gait and mobility (R26.89);Muscle weakness (generalized) (M62.81)    Time: 8119-1478 PT Time Calculation (min) (ACUTE ONLY): 29 min   Charges:   PT Evaluation $PT Eval Moderate Complexity: 1 Mod   PT General Charges $$ ACUTE PT VISIT: 1 Visit         Howie Ill, PT, DPT 09/14/23 12:57 PM

## 2023-09-14 NOTE — TOC Progression Note (Signed)
Transition of Care [facility]) - Progression Note    Patient Details  Name: IROH JESCHKE MRN: [id number] Date of Birth: 04/27/1941  [facility] CM/SW Contact  Liliana Cline, LCSW Phone Number: 09/14/2023, 11:25 AM  Clinical Narrative:    Therapy is recommending SNF. Per OT, patient would need someone to physically assist him with walking with his walker. Spoke with POA [name]. [name] states he is able to physically assist patient and "as long as patient can walk (with assist), we are good". [name] states he would like patient to return home with [facility]. [name] also requested outpatient Palliative Care. Referral made to [place] with [facility]. Updated Care Team.   Expected Discharge Plan: Home w Home Health Services Barriers to Discharge: Continued Medical Work up  Expected Discharge Plan and Services       Living arrangements for the past 2 months: Single Family Home Expected Discharge Date: 09/14/23                                     Social Determinants of Health (SDOH) Interventions SDOH Screenings   Food Insecurity: No Food Insecurity (09/13/2023)  Housing: Patient Declined (09/13/2023)  Transportation Needs: No Transportation Needs (09/13/2023)  Utilities: Not At Risk (09/13/2023)  Financial Resource Strain: Low Risk  (08/11/2023)   Received from [facility]  Tobacco Use: Medium Risk (08/11/2023)   [facility] from [facility]    Readmission Risk Interventions    09/13/2023    3:30 PM 09/05/2023    1:05 PM 04/21/2022    1:55 PM  Readmission Risk Prevention Plan  Post Dischage Appt   Complete  Medication Screening   Complete  Transportation Screening Complete Complete Complete  Medication Review [employer]) Complete Complete   PCP or Specialist appointment within 3-5 days of discharge Complete --   HRI or Home Care Consult Complete Complete   SW Recovery Care/Counseling Consult Complete  Complete   Palliative Care Screening Not Applicable Not Applicable   Skilled Nursing Facility Complete Not Applicable

## 2023-09-17 LAB — CULTURE, BLOOD (ROUTINE X 2)
Culture: NO GROWTH
Culture: NO GROWTH
Special Requests: ADEQUATE

## 2023-09-18 ENCOUNTER — Ambulatory Visit: Payer: Medicare HMO | Attending: Infectious Diseases | Admitting: Infectious Diseases

## 2023-09-18 VITALS — BP 143/83 | HR 78 | Temp 97.3°F

## 2023-09-18 DIAGNOSIS — I4891 Unspecified atrial fibrillation: Secondary | ICD-10-CM | POA: Insufficient documentation

## 2023-09-18 DIAGNOSIS — R3911 Hesitancy of micturition: Secondary | ICD-10-CM | POA: Diagnosis not present

## 2023-09-18 DIAGNOSIS — E785 Hyperlipidemia, unspecified: Secondary | ICD-10-CM | POA: Diagnosis not present

## 2023-09-18 DIAGNOSIS — R1310 Dysphagia, unspecified: Secondary | ICD-10-CM | POA: Insufficient documentation

## 2023-09-18 DIAGNOSIS — B9620 Unspecified Escherichia coli [E. coli] as the cause of diseases classified elsewhere: Secondary | ICD-10-CM | POA: Diagnosis not present

## 2023-09-18 DIAGNOSIS — J449 Chronic obstructive pulmonary disease, unspecified: Secondary | ICD-10-CM | POA: Diagnosis not present

## 2023-09-18 DIAGNOSIS — A419 Sepsis, unspecified organism: Secondary | ICD-10-CM | POA: Diagnosis present

## 2023-09-18 DIAGNOSIS — I10 Essential (primary) hypertension: Secondary | ICD-10-CM | POA: Diagnosis not present

## 2023-09-18 DIAGNOSIS — N401 Enlarged prostate with lower urinary tract symptoms: Secondary | ICD-10-CM | POA: Diagnosis not present

## 2023-09-18 DIAGNOSIS — Z8619 Personal history of other infectious and parasitic diseases: Secondary | ICD-10-CM | POA: Diagnosis present

## 2023-09-18 DIAGNOSIS — R3914 Feeling of incomplete bladder emptying: Secondary | ICD-10-CM | POA: Diagnosis not present

## 2023-09-18 DIAGNOSIS — G40909 Epilepsy, unspecified, not intractable, without status epilepticus: Secondary | ICD-10-CM | POA: Diagnosis not present

## 2023-09-18 DIAGNOSIS — F0150 Vascular dementia without behavioral disturbance: Secondary | ICD-10-CM | POA: Diagnosis not present

## 2023-09-18 DIAGNOSIS — I69391 Dysphagia following cerebral infarction: Secondary | ICD-10-CM | POA: Insufficient documentation

## 2023-09-18 DIAGNOSIS — Z7901 Long term (current) use of anticoagulants: Secondary | ICD-10-CM | POA: Insufficient documentation

## 2023-09-18 NOTE — Patient Instructions (Addendum)
You are here because at one time your urine had ESBL ecoli bacteria- As Richard Carter your attendant said you have trouble initiating  urine- you have prostate enlargement causing bladder outlet problem- restart the flomax- see urologist and discuss with your PCP You dont need any antibiotics now- Do not ask your doctors to routinely check urine.

## 2023-09-18 NOTE — Progress Notes (Signed)
NAME: Richard Carter  DOB: Apr 03, 1941  MRN: [id number]  Date/Time: 09/18/2023 10:19 AM   Subjective:   ?pt is here with his home attendant [name]. PT does not give any history  Richard Carter is a 82 y.o. with a history of CVA, vascular dementia, Seizure disorder, Residual dysphagia on honey thick liquids and pureed food, HTN, HLD, COPD , BPH is referred to me for UTI with ESBL ecoli . Pt has been in hospital in Oct  ( form 10-16) for shortness of breath and possible hemoptysis and got treated for strong urine odor with IV ertapenem and is referred to me As per his attendant  he was on flomax but that was stopped during his last admission to hospital between 10/18-10/20 when he was admitted for coffee ground vomitus , received a unit of blood . Pt has no complaints currently but he does not talk much   Past Medical History:  Diagnosis Date   Alcohol abuse    Dementia (HCC)    GERD (gastroesophageal reflux disease)    HLD (hyperlipidemia)    HTN (hypertension)    Seizures (HCC)    Stroke [facility])     Past Surgical History:  Procedure Laterality Date   ENDARTERECTOMY Left 04/02/2023   Procedure: ENDARTERECTOMY CAROTID;  Surgeon: Renford Dills, MD;  Location: [facility] ORS;  Service: Vascular;  Laterality: Left;   skin cyst removal      Social History   Socioeconomic History   Marital status: Widowed    Spouse name: Not on file   Number of children: Not on file   Years of education: Not on file   Highest education level: Not on file  Occupational History   Not on file  Tobacco Use   Smoking status: Former    Current packs/day: 0.00    Average packs/day: 0.5 packs/day for 68.0 years (34.0 ttl pk-yrs)    Types: Cigarettes    Start date: 49    Quit date: 2023    Years since quitting: 1.8   Smokeless tobacco: Never  Vaping Use   Vaping status: Never Used  Substance and Sexual Activity   Alcohol use: Not Currently    Comment: Drinks about 4oz of liquor daily. Last drink  was yesterday 04/19/22.   Drug use: Never   Sexual activity: Not Currently  Other Topics Concern   Not on file  Social History Narrative   Not on file   Social Determinants of Health   Financial Resource Strain: Low Risk  (08/11/2023)   Received from [facility]   Overall Financial Resource Strain (CARDIA)    Difficulty of Paying Living Expenses: Not hard at all  Food Insecurity: No Food Insecurity (09/13/2023)   Hunger Vital Sign    Worried About Running Out of Food in the Last Year: Never true    Ran Out of Food in the Last Year: Never true  Transportation Needs: No Transportation Needs (09/13/2023)   PRAPARE - Administrator, Civil Service (Medical): No    Lack of Transportation (Non-Medical): No  Physical Activity: Not on file  Stress: Not on file  Social Connections: Not on file  Intimate Partner Violence: Not At Risk (09/13/2023)   Humiliation, Afraid, Rape, and Kick questionnaire    Fear of Current or Ex-Partner: No    Emotionally Abused: No    Physically Abused: No    Sexually Abused: No    Family History  Problem Relation Age of Onset  Stroke Mother    Cancer Brother    No Known Allergies I? Current Outpatient Medications  Medication Sig Dispense Refill   acetaminophen (TYLENOL) 500 MG tablet Take 1,000 mg by mouth 2 (two) times daily as needed for moderate pain, fever or headache.     albuterol (PROVENTIL) (2.5 MG/3ML) 0.083% nebulizer solution Take 3 mLs (2.5 mg total) by nebulization every 6 (six) hours as needed for wheezing or shortness of breath. 75 mL 12   apixaban (ELIQUIS) 2.5 MG TABS tablet Take 1 tablet (2.5 mg total) by mouth 2 (two) times daily. 60 tablet 0   atorvastatin (LIPITOR) 20 MG tablet Take 20 mg by mouth every evening.     bisacodyl (DULCOLAX) 5 MG EC tablet Take 1 tablet (5 mg total) by mouth daily as needed for severe constipation.     ferrous sulfate 325 (65 FE) MG EC tablet Take 1 tablet (325 mg total) by  mouth daily with breakfast. 60 tablet 0   food thickener (SIMPLYTHICK, HONEY/LEVEL 3/MODERATELY THICK,) LIQD Take 1 packet by mouth as needed. 100 packet 0   levETIRAcetam (KEPPRA) 500 MG tablet Take 1 tablet (500 mg total) by mouth 2 (two) times daily.     Nebulizers (COMPRESSOR/NEBULIZER) MISC Use as directed for nebulized medications 1 each 0   ondansetron (ZOFRAN-ODT) 4 MG disintegrating tablet Take 4 mg by mouth every 8 (eight) hours as needed for nausea or vomiting.     pantoprazole (PROTONIX) 40 MG tablet Take 1 tablet (40 mg total) by mouth 2 (two) times daily.     senna-docusate (SENOKOT-S) 8.6-50 MG tablet Take 1 tablet by mouth at bedtime.     traZODone (DESYREL) 100 MG tablet Take 1 tablet (100 mg total) by mouth at bedtime.     vitamin B-12 (CYANOCOBALAMIN) 500 MCG tablet Take 500 mcg by mouth daily.     No current facility-administered medications for this visit.     Abtx:  Anti-infectives (From admission, onward)    None       REVIEW OF SYSTEMS:  As per his attendant he does not like to drink thickened fluids and so as a consequence takes less fluids- He does cough and choke when he takes regular food or water  HE can have cloudy urine He sometimes strain to pass urine Objective:  VITALS:  BP (!) 143/83   Pulse 78   Temp (!) 97.3 F (36.3 C) (Temporal)   PHYSICAL EXAM:  General: pt in wheel chair, says yes or No Head: Normocephalic, without obvious abnormality, atraumatic. Lungs: b/l air entry. Heart: irregular, well controlled. Abdomen: Soft, non-tender,not distended. Extremities: atraumatic, no cyanosis. No edema. No clubbing Skin: No rashes or lesions. Or bruising Lymph: Cervical, supraclavicular normal. Neurologic: did not examine in detail Pertinent Labs Lab Results CBC    Component Value Date/Time   WBC 8.0 09/12/2023 1620   RBC 2.34 (L) 09/12/2023 1620   HGB 9.0 (L) 09/14/2023 0555   HGB 12.9 (L) 10/25/2014 1048   HCT 21.7 (L) 09/12/2023 1620    HCT 38.6 (L) 10/25/2014 1048   PLT 252 09/12/2023 1620   PLT 93 (L) 10/25/2014 1048   MCV 92.7 09/12/2023 1620   MCV 108 (H) 10/25/2014 1048   MCH 29.9 09/12/2023 1620   MCHC 32.3 09/12/2023 1620   RDW 16.7 (H) 09/12/2023 1620   RDW 19.6 (H) 10/25/2014 1048   LYMPHSABS 1.3 09/12/2023 0853   LYMPHSABS 0.8 (L) 10/25/2014 1048   MONOABS 0.7 09/12/2023 0853   MONOABS  0.9 10/25/2014 1048   EOSABS 0.4 09/12/2023 0853   EOSABS 0.0 10/25/2014 1048   BASOSABS 0.1 09/12/2023 0853   BASOSABS 0.0 10/25/2014 1048       Latest Ref Rng & Units 09/12/2023    4:20 PM 09/12/2023    8:53 AM 09/10/2023    4:31 AM  CMP  Glucose 70 - 99 mg/dL  94  79   BUN 8 - 23 mg/dL  18  23   Creatinine 1.61 - 1.24 mg/dL 0.96  0.45  4.09   Sodium 135 - 145 mmol/L  142  140   Potassium 3.5 - 5.1 mmol/L  3.7  4.2   Chloride 98 - 111 mmol/L  108  112   CO2 22 - 32 mmol/L  25  23   Calcium 8.9 - 10.3 mg/dL  9.5  9.1   Total Protein 6.5 - 8.1 g/dL  7.0    Total Bilirubin 0.3 - 1.2 mg/dL  0.6    Alkaline Phos 38 - 126 U/L  74    AST 15 - 41 U/L  26    ALT 0 - 44 U/L  19        Microbiology: Recent Results (from the past 240 hour(s))  Blood culture (routine x 2)     Status: None   Collection Time: 09/12/23 10:38 AM   Specimen: BLOOD  Result Value Ref Range Status   Specimen Description BLOOD BLOOD RIGHT HAND  Final   Special Requests   Final    BOTTLES DRAWN AEROBIC AND ANAEROBIC Blood Culture adequate volume   Culture   Final    NO GROWTH 5 DAYS Performed at [facility], [address] [place], [place] [address]    Report Status 09/17/2023 FINAL  Final  Blood culture (routine x 2)     Status: None   Collection Time: 09/12/23 10:48 AM   Specimen: BLOOD  Result Value Ref Range Status   Specimen Description BLOOD BLOOD LEFT ARM  Final   Special Requests   Final    BOTTLES DRAWN AEROBIC AND ANAEROBIC Blood Culture results may not be optimal due to an excessive volume of blood received  in culture bottles   Culture   Final    NO GROWTH 5 DAYS Performed at [facility], [address] [place], [place] [address]    Report Status 09/17/2023 FINAL  Final    IMAGING RESULTS: I have personally reviewed the films CT abdomen and pelvis reviewed from 10/18 Mild urinary bladder wall thickening due to Chronic BOO Prostate enlarged Moderate stool  ? Impression/Recommendation ?BPH with LUTS and incomplete emptying Pt needs to restart Flomax- Need post void bladder scan- Need to go back to the urologist Informed Dr.Stoioff who saw him in OCT 2023  ESBL ecoli could be colonizing the urine because likely he has significnat residual urine Avoid antibiotics unless febrile or systemic illness Do not test urine routinely The treatment is to relive symptoms of BPH so that he wont have incomplete emptying Also not drinking water ( due to dysphagia) and staying dehydrated can reduce urine volume and make it cloudy  CVA   Vascular dementia  Afib on eliquis H/o Coffee ground vomitus  Discussed with patient's care giver Discussed with Dr.Stoioff Pt will follow up with urologist    ________________________________________________ Follow PRN Note:  This document was prepared using Dragon voice recognition software and may include unintentional dictation errors.

## 2023-09-22 DIAGNOSIS — N401 Enlarged prostate with lower urinary tract symptoms: Secondary | ICD-10-CM | POA: Diagnosis not present

## 2023-09-22 DIAGNOSIS — D638 Anemia in other chronic diseases classified elsewhere: Secondary | ICD-10-CM | POA: Diagnosis not present

## 2023-09-22 DIAGNOSIS — R3911 Hesitancy of micturition: Secondary | ICD-10-CM | POA: Diagnosis not present

## 2023-09-22 DIAGNOSIS — I69319 Unspecified symptoms and signs involving cognitive functions following cerebral infarction: Secondary | ICD-10-CM | POA: Diagnosis not present

## 2023-09-22 DIAGNOSIS — Z09 Encounter for follow-up examination after completed treatment for conditions other than malignant neoplasm: Secondary | ICD-10-CM | POA: Diagnosis not present

## 2023-09-22 DIAGNOSIS — K5900 Constipation, unspecified: Secondary | ICD-10-CM | POA: Diagnosis not present

## 2023-09-22 DIAGNOSIS — R1310 Dysphagia, unspecified: Secondary | ICD-10-CM | POA: Diagnosis not present

## 2023-09-22 DIAGNOSIS — I480 Paroxysmal atrial fibrillation: Secondary | ICD-10-CM | POA: Diagnosis not present

## 2023-10-01 DIAGNOSIS — E7800 Pure hypercholesterolemia, unspecified: Secondary | ICD-10-CM | POA: Diagnosis not present

## 2023-10-01 DIAGNOSIS — G309 Alzheimer's disease, unspecified: Secondary | ICD-10-CM | POA: Diagnosis not present

## 2023-10-01 DIAGNOSIS — R001 Bradycardia, unspecified: Secondary | ICD-10-CM | POA: Diagnosis not present

## 2023-10-01 DIAGNOSIS — Z8719 Personal history of other diseases of the digestive system: Secondary | ICD-10-CM | POA: Diagnosis not present

## 2023-10-01 DIAGNOSIS — F0150 Vascular dementia without behavioral disturbance: Secondary | ICD-10-CM | POA: Diagnosis not present

## 2023-10-01 DIAGNOSIS — F0280 Dementia in other diseases classified elsewhere without behavioral disturbance: Secondary | ICD-10-CM | POA: Diagnosis not present

## 2023-10-01 DIAGNOSIS — Z8673 Personal history of transient ischemic attack (TIA), and cerebral infarction without residual deficits: Secondary | ICD-10-CM | POA: Diagnosis not present

## 2023-10-01 DIAGNOSIS — I480 Paroxysmal atrial fibrillation: Secondary | ICD-10-CM | POA: Diagnosis not present

## 2023-10-11 ENCOUNTER — Encounter: Payer: Self-pay | Admitting: *Deleted

## 2023-10-11 ENCOUNTER — Emergency Department: Payer: Medicare HMO

## 2023-10-11 ENCOUNTER — Inpatient Hospital Stay
Admission: EM | Admit: 2023-10-11 | Discharge: 2023-10-13 | DRG: 690 | Disposition: A | Discharge status: Home Health | Payer: Medicare HMO | Attending: Internal Medicine | Admitting: Internal Medicine

## 2023-10-11 ENCOUNTER — Other Ambulatory Visit: Payer: Self-pay

## 2023-10-11 DIAGNOSIS — M19041 Primary osteoarthritis, right hand: Secondary | ICD-10-CM | POA: Diagnosis not present

## 2023-10-11 DIAGNOSIS — R4182 Altered mental status, unspecified: Secondary | ICD-10-CM | POA: Diagnosis not present

## 2023-10-11 DIAGNOSIS — I10 Essential (primary) hypertension: Secondary | ICD-10-CM | POA: Diagnosis not present

## 2023-10-11 DIAGNOSIS — G40909 Epilepsy, unspecified, not intractable, without status epilepticus: Secondary | ICD-10-CM | POA: Diagnosis not present

## 2023-10-11 DIAGNOSIS — Z87891 Personal history of nicotine dependence: Secondary | ICD-10-CM

## 2023-10-11 DIAGNOSIS — R531 Weakness: Secondary | ICD-10-CM | POA: Diagnosis present

## 2023-10-11 DIAGNOSIS — S2243XA Multiple fractures of ribs, bilateral, initial encounter for closed fracture: Secondary | ICD-10-CM | POA: Diagnosis not present

## 2023-10-11 DIAGNOSIS — N3000 Acute cystitis without hematuria: Secondary | ICD-10-CM | POA: Diagnosis not present

## 2023-10-11 DIAGNOSIS — F1010 Alcohol abuse, uncomplicated: Secondary | ICD-10-CM | POA: Diagnosis present

## 2023-10-11 DIAGNOSIS — I69391 Dysphagia following cerebral infarction: Secondary | ICD-10-CM

## 2023-10-11 DIAGNOSIS — N400 Enlarged prostate without lower urinary tract symptoms: Secondary | ICD-10-CM | POA: Diagnosis present

## 2023-10-11 DIAGNOSIS — M5136 Other intervertebral disc degeneration, lumbar region with discogenic back pain only: Secondary | ICD-10-CM | POA: Diagnosis not present

## 2023-10-11 DIAGNOSIS — K219 Gastro-esophageal reflux disease without esophagitis: Secondary | ICD-10-CM | POA: Diagnosis present

## 2023-10-11 DIAGNOSIS — I480 Paroxysmal atrial fibrillation: Secondary | ICD-10-CM | POA: Diagnosis present

## 2023-10-11 DIAGNOSIS — J449 Chronic obstructive pulmonary disease, unspecified: Secondary | ICD-10-CM | POA: Diagnosis not present

## 2023-10-11 DIAGNOSIS — Y92009 Unspecified place in unspecified non-institutional (private) residence as the place of occurrence of the external cause: Secondary | ICD-10-CM | POA: Diagnosis not present

## 2023-10-11 DIAGNOSIS — Z823 Family history of stroke: Secondary | ICD-10-CM | POA: Diagnosis not present

## 2023-10-11 DIAGNOSIS — M549 Dorsalgia, unspecified: Secondary | ICD-10-CM | POA: Diagnosis not present

## 2023-10-11 DIAGNOSIS — Z66 Do not resuscitate: Secondary | ICD-10-CM | POA: Diagnosis not present

## 2023-10-11 DIAGNOSIS — R3129 Other microscopic hematuria: Secondary | ICD-10-CM | POA: Diagnosis present

## 2023-10-11 DIAGNOSIS — N390 Urinary tract infection, site not specified: Principal | ICD-10-CM | POA: Diagnosis present

## 2023-10-11 DIAGNOSIS — I2510 Atherosclerotic heart disease of native coronary artery without angina pectoris: Secondary | ICD-10-CM | POA: Diagnosis present

## 2023-10-11 DIAGNOSIS — Z1612 Extended spectrum beta lactamase (ESBL) resistance: Secondary | ICD-10-CM | POA: Diagnosis present

## 2023-10-11 DIAGNOSIS — W19XXXA Unspecified fall, initial encounter: Secondary | ICD-10-CM | POA: Diagnosis present

## 2023-10-11 DIAGNOSIS — M19031 Primary osteoarthritis, right wrist: Secondary | ICD-10-CM | POA: Diagnosis not present

## 2023-10-11 DIAGNOSIS — M47816 Spondylosis without myelopathy or radiculopathy, lumbar region: Secondary | ICD-10-CM | POA: Diagnosis not present

## 2023-10-11 DIAGNOSIS — Z79899 Other long term (current) drug therapy: Secondary | ICD-10-CM | POA: Diagnosis not present

## 2023-10-11 DIAGNOSIS — E785 Hyperlipidemia, unspecified: Secondary | ICD-10-CM | POA: Diagnosis present

## 2023-10-11 DIAGNOSIS — M79641 Pain in right hand: Secondary | ICD-10-CM | POA: Diagnosis not present

## 2023-10-11 DIAGNOSIS — R4701 Aphasia: Secondary | ICD-10-CM | POA: Diagnosis not present

## 2023-10-11 DIAGNOSIS — M25531 Pain in right wrist: Secondary | ICD-10-CM | POA: Diagnosis present

## 2023-10-11 DIAGNOSIS — M419 Scoliosis, unspecified: Secondary | ICD-10-CM | POA: Diagnosis not present

## 2023-10-11 DIAGNOSIS — E860 Dehydration: Secondary | ICD-10-CM | POA: Diagnosis not present

## 2023-10-11 DIAGNOSIS — M4856XA Collapsed vertebra, not elsewhere classified, lumbar region, initial encounter for fracture: Secondary | ICD-10-CM | POA: Diagnosis present

## 2023-10-11 DIAGNOSIS — N3001 Acute cystitis with hematuria: Secondary | ICD-10-CM | POA: Diagnosis not present

## 2023-10-11 DIAGNOSIS — F0390 Unspecified dementia without behavioral disturbance: Secondary | ICD-10-CM | POA: Diagnosis not present

## 2023-10-11 DIAGNOSIS — Z7901 Long term (current) use of anticoagulants: Secondary | ICD-10-CM

## 2023-10-11 DIAGNOSIS — I6782 Cerebral ischemia: Secondary | ICD-10-CM | POA: Diagnosis not present

## 2023-10-11 DIAGNOSIS — Z8619 Personal history of other infectious and parasitic diseases: Secondary | ICD-10-CM

## 2023-10-11 DIAGNOSIS — S199XXA Unspecified injury of neck, initial encounter: Secondary | ICD-10-CM | POA: Diagnosis not present

## 2023-10-11 LAB — CBC WITH DIFFERENTIAL/PLATELET
Abs Immature Granulocytes: 0.06 10*3/uL (ref 0.00–0.07)
Basophils Absolute: 0 10*3/uL (ref 0.0–0.1)
Basophils Relative: 0 %
Eosinophils Absolute: 0.1 10*3/uL (ref 0.0–0.5)
Eosinophils Relative: 1 %
HCT: 35 % — ABNORMAL LOW (ref 39.0–52.0)
Hemoglobin: 11.1 g/dL — ABNORMAL LOW (ref 13.0–17.0)
Immature Granulocytes: 0 %
Lymphocytes Relative: 11 %
Lymphs Abs: 1.5 10*3/uL (ref 0.7–4.0)
MCH: 29.2 pg (ref 26.0–34.0)
MCHC: 31.7 g/dL (ref 30.0–36.0)
MCV: 92.1 fL (ref 80.0–100.0)
Monocytes Absolute: 1.1 10*3/uL — ABNORMAL HIGH (ref 0.1–1.0)
Monocytes Relative: 8 %
Neutro Abs: 11.1 10*3/uL — ABNORMAL HIGH (ref 1.7–7.7)
Neutrophils Relative %: 80 %
Platelets: 322 10*3/uL (ref 150–400)
RBC: 3.8 MIL/uL — ABNORMAL LOW (ref 4.22–5.81)
RDW: 14.9 % (ref 11.5–15.5)
WBC: 13.8 10*3/uL — ABNORMAL HIGH (ref 4.0–10.5)
nRBC: 0 % (ref 0.0–0.2)

## 2023-10-11 LAB — URINALYSIS, ROUTINE W REFLEX MICROSCOPIC
Bacteria, UA: NONE SEEN
Bilirubin Urine: NEGATIVE
Glucose, UA: NEGATIVE mg/dL
Ketones, ur: NEGATIVE mg/dL
Nitrite: POSITIVE — AB
Protein, ur: 100 mg/dL — AB
RBC / HPF: >50 RBC/hpf (ref 0–5)
Specific Gravity, Urine: 1.015 (ref 1.005–1.030)
WBC, UA: >50 WBC/hpf (ref 0–5)
pH: 5 (ref 5.0–8.0)

## 2023-10-11 LAB — LIPASE, BLOOD: Lipase: 28 U/L (ref 11–51)

## 2023-10-11 LAB — COMPREHENSIVE METABOLIC PANEL
ALT: 12 U/L (ref 0–44)
AST: 19 U/L (ref 15–41)
Albumin: 3.8 g/dL (ref 3.5–5.0)
Alkaline Phosphatase: 104 U/L (ref 38–126)
Anion gap: 9 (ref 5–15)
BUN: 17 mg/dL (ref 8–23)
CO2: 22 mmol/L (ref 22–32)
Calcium: 9.9 mg/dL (ref 8.9–10.3)
Chloride: 110 mmol/L (ref 98–111)
Creatinine, Ser: 0.97 mg/dL (ref 0.61–1.24)
GFR, Estimated: >60 mL/min (ref >60)
Glucose, Bld: 91 mg/dL (ref 70–99)
Potassium: 3.9 mmol/L (ref 3.5–5.1)
Sodium: 141 mmol/L (ref 135–145)
Total Bilirubin: 0.9 mg/dL (ref <1.2)
Total Protein: 7.8 g/dL (ref 6.5–8.1)

## 2023-10-11 MED ORDER — TRAZODONE HCL 50 MG PO TABS
100.0000 mg | ORAL_TABLET | Freq: Every day | ORAL | Status: DC
  Administered 2023-10-11 – 2023-10-12 (×2): 100 mg via ORAL
  Filled 2023-10-11 (×2): qty 2

## 2023-10-11 MED ORDER — ATORVASTATIN CALCIUM 20 MG PO TABS
20.0000 mg | ORAL_TABLET | Freq: Every evening | ORAL | Status: DC
  Administered 2023-10-11 – 2023-10-12 (×2): 20 mg via ORAL
  Filled 2023-10-11 (×2): qty 1

## 2023-10-11 MED ORDER — SENNOSIDES-DOCUSATE SODIUM 8.6-50 MG PO TABS
1.0000 | ORAL_TABLET | Freq: Every day | ORAL | Status: DC
  Administered 2023-10-11 – 2023-10-12 (×2): 1 via ORAL
  Filled 2023-10-11 (×2): qty 1

## 2023-10-11 MED ORDER — HYDRALAZINE HCL 20 MG/ML IJ SOLN
5.0000 mg | Freq: Four times a day (QID) | INTRAMUSCULAR | Status: DC | PRN
  Administered 2023-10-11: 5 mg via INTRAVENOUS
  Filled 2023-10-11: qty 1

## 2023-10-11 MED ORDER — ALBUTEROL SULFATE (2.5 MG/3ML) 0.083% IN NEBU: 2.5000 mg | INHALATION_SOLUTION | Freq: Four times a day (QID) | RESPIRATORY_TRACT | Status: DC | PRN

## 2023-10-11 MED ORDER — TAMSULOSIN HCL 0.4 MG PO CAPS
0.4000 mg | ORAL_CAPSULE | Freq: Every day | ORAL | Status: DC
  Administered 2023-10-11 – 2023-10-12 (×2): 0.4 mg via ORAL
  Filled 2023-10-11 (×2): qty 1

## 2023-10-11 MED ORDER — SODIUM CHLORIDE 0.9 % IV SOLN
1.0000 g | Freq: Once | INTRAVENOUS | Status: AC
  Administered 2023-10-11: 1 g via INTRAVENOUS
  Filled 2023-10-11: qty 20

## 2023-10-11 MED ORDER — BISACODYL 5 MG PO TBEC: 5.0000 mg | DELAYED_RELEASE_TABLET | Freq: Every day | ORAL | Status: DC | PRN

## 2023-10-11 MED ORDER — FOOD THICKENER (SIMPLYTHICK HONEY)
1.0000 | ORAL | Status: DC | PRN
  Filled 2023-10-11: qty 1

## 2023-10-11 MED ORDER — ACETAMINOPHEN 500 MG PO TABS: 1000.0000 mg | ORAL_TABLET | Freq: Two times a day (BID) | ORAL | Status: DC | PRN

## 2023-10-11 MED ORDER — ONDANSETRON 4 MG PO TBDP: 4.0000 mg | ORAL_TABLET | Freq: Three times a day (TID) | ORAL | Status: DC | PRN

## 2023-10-11 MED ORDER — PANTOPRAZOLE SODIUM 40 MG PO TBEC
40.0000 mg | DELAYED_RELEASE_TABLET | Freq: Two times a day (BID) | ORAL | Status: DC
  Administered 2023-10-11: 40 mg via ORAL
  Filled 2023-10-11: qty 1

## 2023-10-11 MED ORDER — SODIUM CHLORIDE 0.9 % IV SOLN: 1.0000 g | Freq: Two times a day (BID) | INTRAVENOUS | Status: DC

## 2023-10-11 MED ORDER — LEVETIRACETAM 500 MG PO TABS
500.0000 mg | ORAL_TABLET | Freq: Two times a day (BID) | ORAL | Status: DC
  Administered 2023-10-11 – 2023-10-13 (×4): 500 mg via ORAL
  Filled 2023-10-11 (×5): qty 1

## 2023-10-11 MED ORDER — SODIUM CHLORIDE 0.9 % IV SOLN: INTRAVENOUS | Status: DC

## 2023-10-11 MED ORDER — LIDOCAINE HCL URETHRAL/MUCOSAL 2 % EX GEL
1.0000 | Freq: Once | CUTANEOUS | Status: AC
  Administered 2023-10-11: 1 via URETHRAL
  Filled 2023-10-11: qty 10

## 2023-10-11 MED ORDER — APIXABAN 2.5 MG PO TABS
2.5000 mg | ORAL_TABLET | Freq: Two times a day (BID) | ORAL | Status: DC
  Administered 2023-10-11 – 2023-10-13 (×4): 2.5 mg via ORAL
  Filled 2023-10-11 (×6): qty 1

## 2023-10-11 NOTE — ED Notes (Signed)
This RN, Social research officer, government, and Aundra Millet EDT attempted to collect urine specimen using a coude catheter. Only able to obtain 2mL of urine from coude. Theron RN walked specimen to lab at this time.

## 2023-10-11 NOTE — ED Notes (Signed)
This RN, Social research officer, government, and Aundra Millet EDT attempted to in and out catheter without success. This RN unable to collect urine specimen at this time. MD made aware.

## 2023-10-11 NOTE — Progress Notes (Signed)
Pt speech incomprehensible unable to complete admission profile at this time

## 2023-10-11 NOTE — ED Triage Notes (Signed)
Per EMS report, patient c/o low back pain that began last night and has gotten worse. Patient also c/o bilateral rib pain. Patient denies falls, ambulates with a walker at home. Patient has a 24/7 caregiver and POA.  129/94 82 pulse 98 room air CBG 115

## 2023-10-11 NOTE — ED Notes (Signed)
Report given to Travis RN

## 2023-10-11 NOTE — ED Notes (Signed)
Pt in bed, pt states that he had a stroke last week, pt moving all extremities, pt is generally weak, pt is oriented to person, place and day of the week, but is slow to answer, pt states that he had some back pain earlier today, but denies pain at this time.  Md at bedside, care giver at bedside, per care giver pt is a little more groggy, but is at his baseline.

## 2023-10-11 NOTE — ED Notes (Addendum)
Patient's speech is soft and garbled. Patient is very drowsy. Caregiver states he does sleep a lot. Caregiver states patient ambulates with walker and has not fallen.

## 2023-10-11 NOTE — Consult Note (Signed)
PHARMACY -  BRIEF ANTIBIOTIC NOTE   Pharmacy has received consult(s) for Meropenem from an ED provider.  The patient's profile has been reviewed for ht/wt/allergies/indication/available labs.    One time order(s) placed for : Meropenem 1gm IV x 1 dose  Further antibiotics/pharmacy consults should be ordered by admitting physician if indicated.                       Thank you, Maynard David Rodriguez-Guzman PharmD, BCPS 10/11/2023 3:36 PM

## 2023-10-11 NOTE — H&P (Signed)
History and Physical    MCADOO LIVERPOOL [name]:[id number] DOB: 1941-01-13 DOA: 10/11/2023  PCP: Marisue Ivan, MD (Confirm with patient/family/NH records and if not entered, this has to be entered at [facility] point of entry) Patient coming from: Home  I have personally briefly reviewed patient's old medical records in [facility]  Chief Complaint: Feeling weak, back pain  HPI: Richard Carter is a 82 y.o. male with medical history significant of CVA with residual dysphagia on pured diet with honey thick liquid, seizure disorders, advanced dementia, PAF on Eliquis, HTN, recent GI bleed, COPD, BPH and frequent UTIs.  Brought in by caregiver for evaluation of worsening general weakness and fall at home.  Symptoms started 3 days ago, patient started develop generalized weakness gradually getting worse and lost appetite completed yesterday became dehydrated even weaker.  This morning patient tried a stand up and ambulate and feeling weak, and fell backward and hit his back, no head or neck injury.  Patient complaining about back pain and right wrist pain.  X-ray in the ED showed no fracture or dislocation on CT head and neck.  Lumbar spine x-ray showed chronic L1 compression fracture.  Caregiver reported patient has had 8 episodes of UTI including 1 episode of ESBL UTI this year.  Patient does have severe BPH and was referred to see urology however caregiver reported that they never heard from urology were received any calls from urology since was given the referral.  Patient denied any abdominal pain no dysuria.  Caregiver has been giving patient Flomax from his own medications.    In the ED, blood work showed WBC 13.8, hemoglobin 11.1, creatinine 0.9, BUN 17, UA showed picture of UTI with microscopic hematuria.  Patient was given ceftriaxone in the ED.    Review of Systems: As per HPI otherwise 14 point review of systems negative.    Past Medical History:  Diagnosis Date   Alcohol abuse     Dementia (HCC)    GERD (gastroesophageal reflux disease)    HLD (hyperlipidemia)    HTN (hypertension)    Seizures (HCC)    Stroke [facility])     Past Surgical History:  Procedure Laterality Date   ENDARTERECTOMY Left 04/02/2023   Procedure: ENDARTERECTOMY CAROTID;  Surgeon: Renford Dills, MD;  Location: [facility] ORS;  Service: Vascular;  Laterality: Left;   skin cyst removal       reports that he quit smoking about 22 months ago. His smoking use included cigarettes. He started smoking about 69 years ago. He has a 34 pack-year smoking history. He has never used smokeless tobacco. He reports that he does not currently use alcohol. He reports that he does not use drugs.  No Known Allergies  Family History  Problem Relation Age of Onset   Stroke Mother    Cancer Brother     Prior to Admission medications   Medication Sig Start Date End Date Taking? Authorizing Provider  acetaminophen (TYLENOL) 500 MG tablet Take 1,000 mg by mouth 2 (two) times daily as needed for moderate pain, fever or headache.    [provider]  albuterol (PROVENTIL) (2.5 MG/3ML) 0.083% nebulizer solution Take 3 mLs (2.5 mg total) by nebulization every 6 (six) hours as needed for wheezing or shortness of breath. 09/10/23   Sunnie Nielsen, DO  apixaban (ELIQUIS) 2.5 MG TABS tablet Take 1 tablet (2.5 mg total) by mouth 2 (two) times daily. 09/14/23   Marrion Coy, MD  atorvastatin (LIPITOR) 20 MG tablet Take  20 mg by mouth every evening.    [provider]  bisacodyl (DULCOLAX) 5 MG EC tablet Take 1 tablet (5 mg total) by mouth daily as needed for severe constipation. 07/18/23   Pokhrel, Rebekah Chesterfield, MD  ferrous sulfate 325 (65 FE) MG EC tablet Take 1 tablet (325 mg total) by mouth daily with breakfast. 09/14/23 09/13/24  Marrion Coy, MD  food thickener (SIMPLYTHICK, HONEY/LEVEL 3/MODERATELY THICK,) LIQD Take 1 packet by mouth as needed. 09/10/23   Sunnie Nielsen, DO  levETIRAcetam (KEPPRA) 500 MG  tablet Take 1 tablet (500 mg total) by mouth 2 (two) times daily. 07/18/23   Pokhrel, Rebekah Chesterfield, MD  Nebulizers (COMPRESSOR/NEBULIZER) MISC Use as directed for nebulized medications 09/10/23   Sunnie Nielsen, DO  ondansetron (ZOFRAN-ODT) 4 MG disintegrating tablet Take 4 mg by mouth every 8 (eight) hours as needed for nausea or vomiting.    [provider]  pantoprazole (PROTONIX) 40 MG tablet Take 1 tablet (40 mg total) by mouth 2 (two) times daily. 09/14/23 11/13/23  Marrion Coy, MD  senna-docusate (SENOKOT-S) 8.6-50 MG tablet Take 1 tablet by mouth at bedtime. 07/18/23   Pokhrel, Rebekah Chesterfield, MD  traZODone (DESYREL) 100 MG tablet Take 1 tablet (100 mg total) by mouth at bedtime. 05/11/21   Tresa Moore, MD  vitamin B-12 (CYANOCOBALAMIN) 500 MCG tablet Take 500 mcg by mouth daily.    [provider]    Physical Exam: Vitals:   10/11/23 0938 10/11/23 0947 10/11/23 1000 10/11/23 1300  BP: (!) 148/81  134/79 (!) 163/92  Pulse: (!) 49  83 70  Resp: 18   18  Temp: 97.7 F (36.5 C)     TempSrc: Oral     SpO2: 98%   99%  Weight:  62.1 kg    Height:  5\' 8"  (1.727 m)      Constitutional: NAD, calm, comfortable Vitals:   10/11/23 0938 10/11/23 0947 10/11/23 1000 10/11/23 1300  BP: (!) 148/81  134/79 (!) 163/92  Pulse: (!) 49  83 70  Resp: 18   18  Temp: 97.7 F (36.5 C)     TempSrc: Oral     SpO2: 98%   99%  Weight:  62.1 kg    Height:  5\' 8"  (1.727 m)     Eyes: PERRL, lids and conjunctivae normal ENMT: Mucous membranes are dry. Posterior pharynx clear of any exudate or lesions.Normal dentition.  Neck: normal, supple, no masses, no thyromegaly Respiratory: clear to auscultation bilaterally, no wheezing, no crackles. Normal respiratory effort. No accessory muscle use.  Cardiovascular: Regular rate and rhythm, no murmurs / rubs / gallops. No extremity edema. 2+ pedal pulses. No carotid bruits.  Abdomen: no tenderness, no masses palpated. No hepatosplenomegaly. Bowel  sounds positive.  Musculoskeletal: no clubbing / cyanosis. No joint deformity upper and lower extremities. Good ROM, no contractures. Normal muscle tone.  Skin: no rashes, lesions, ulcers. No induration Neurologic: CN 2-12 grossly intact. Sensation intact, DTR normal. Strength 5/5 in all 4.  Psychiatric: Normal judgment and insight. Alert and oriented x 3. Normal mood.     Labs on Admission: I have personally reviewed following labs and imaging studies  CBC: Recent Labs  Lab 10/11/23 1202  WBC 13.8*  NEUTROABS 11.1*  HGB 11.1*  HCT 35.0*  MCV 92.1  PLT 322   Basic Metabolic Panel: Recent Labs  Lab 10/11/23 1202  NA 141  K 3.9  CL 110  CO2 22  GLUCOSE 91  BUN 17  CREATININE 0.97  CALCIUM 9.9   GFR: Estimated Creatinine Clearance: 49.8 mL/min (by C-G formula based on SCr of 0.97 mg/dL). Liver Function Tests: Recent Labs  Lab 10/11/23 1202  AST 19  ALT 12  ALKPHOS 104  BILITOT 0.9  PROT 7.8  ALBUMIN 3.8   Recent Labs  Lab 10/11/23 1202  LIPASE 28   No results for input(s): "AMMONIA" in the last 168 hours. Coagulation Profile: No results for input(s): "INR", "PROTIME" in the last 168 hours. Cardiac Enzymes: No results for input(s): "CKTOTAL", "CKMB", "CKMBINDEX", "TROPONINI" in the last 168 hours. BNP (last 3 results) No results for input(s): "PROBNP" in the last 8760 hours. HbA1C: No results for input(s): "HGBA1C" in the last 72 hours. CBG: No results for input(s): "GLUCAP" in the last 168 hours. Lipid Profile: No results for input(s): "CHOL", "HDL", "LDLCALC", "TRIG", "CHOLHDL", "LDLDIRECT" in the last 72 hours. Thyroid Function Tests: No results for input(s): "TSH", "T4TOTAL", "FREET4", "T3FREE", "THYROIDAB" in the last 72 hours. Anemia Panel: No results for input(s): "VITAMINB12", "FOLATE", "FERRITIN", "TIBC", "IRON", "RETICCTPCT" in the last 72 hours. Urine analysis:    Component Value Date/Time   COLORURINE YELLOW (A) 10/11/2023 1439    APPEARANCEUR TURBID (A) 10/11/2023 1439   APPEARANCEUR Clear 06/12/2014 1029   LABSPEC 1.015 10/11/2023 1439   LABSPEC 1.005 06/12/2014 1029   PHURINE 5.0 10/11/2023 1439   GLUCOSEU NEGATIVE 10/11/2023 1439   GLUCOSEU Negative 06/12/2014 1029   HGBUR LARGE (A) 10/11/2023 1439   BILIRUBINUR NEGATIVE 10/11/2023 1439   BILIRUBINUR Negative 06/12/2014 1029   KETONESUR NEGATIVE 10/11/2023 1439   PROTEINUR 100 (A) 10/11/2023 1439   NITRITE POSITIVE (A) 10/11/2023 1439   LEUKOCYTESUR MODERATE (A) 10/11/2023 1439   LEUKOCYTESUR Negative 06/12/2014 1029    Radiological Exams on Admission: DG Lumbar Spine 2-3 Views  Result Date: 10/11/2023 CLINICAL DATA:  Low back pain. EXAM: LUMBAR SPINE - 2-3 VIEW COMPARISON:  Lumbar spine MRI 11/29/2020 FINDINGS: There are 5 non rib-bearing lumbar type vertebrae. There is mild-to-moderate lumbar levoscoliosis without significant listhesis. A chronic L1 compression fracture is unchanged. No acute fracture is identified. Severe disc space narrowing is again seen at L3-4 and L4-5 with mild narrowing at L2-3. Moderate mid and lower lumbar facet arthrosis and abdominal aortic atherosclerosis are noted. IMPRESSION: 1. No acute osseous abnormality. 2. Chronic L1 compression fracture. 3. Levoscoliosis with disc and facet degeneration. Electronically Signed   By: Sebastian Ache M.D.   On: 10/11/2023 11:55   DG Hand Complete Right  Result Date: 10/11/2023 CLINICAL DATA:  Fall, right hand pain EXAM: RIGHT HAND - COMPLETE 3+ VIEW COMPARISON:  None Available. FINDINGS: Diffuse osseous demineralization. No evidence of an acute fracture. No dislocation. Degenerative changes of the hand and wrist are most pronounced at the first [facility] joint. No focal soft tissue swelling is evident. Vascular calcifications noted. IMPRESSION: 1. No acute fracture or dislocation of the right hand. 2. Degenerative changes of the hand and wrist. Electronically Signed   By: Duanne Guess D.O.   On:  10/11/2023 11:54   CT Head Wo Contrast  Result Date: 10/11/2023 CLINICAL DATA:  Mental status change, unknown cause; Neck trauma (Age >= 65y). EXAM: CT HEAD WITHOUT CONTRAST CT CERVICAL SPINE WITHOUT CONTRAST TECHNIQUE: Multidetector CT imaging of the head and cervical spine was performed following the standard protocol without intravenous contrast. Multiplanar CT image reconstructions of the cervical spine were also generated. RADIATION DOSE REDUCTION: This exam was performed according to the departmental dose-optimization program which includes automated exposure control, adjustment  of the mA and/or kV according to patient size and/or use of iterative reconstruction technique. COMPARISON:  CT head 09/12/2023. MRI head 09/13/2023. CT cervical spine 12/02/2019. FINDINGS: CT HEAD FINDINGS Brain: There is no evidence of an acute infarct, intracranial hemorrhage, mass, midline shift, or extra-axial fluid collection. Moderate-sized chronic left frontoparietal and small chronic right parietal and bilateral occipital cortical infarcts are again seen. There is a background of moderate chronic small vessel ischemia in the cerebral white matter with chronic lacunar infarcts in the left corona radiata and basal ganglia. There is moderate cerebral atrophy. Vascular: Calcified atherosclerosis at the skull base. No hyperdense vessel. Skull: No acute fracture or suspicious osseous lesion. Sinuses/Orbits: Visualized paranasal sinuses and mastoid air cells are clear. Bilateral cataract extraction. Other: None. CT CERVICAL SPINE FINDINGS Alignment: Chronic reversal of the normal cervical lordosis with unchanged grade 1 anterolisthesis of C3 on C4 and C7 on T1. Skull base and vertebrae: No acute fracture or suspicious osseous lesion. Remote bilateral first rib fractures. Moderate median C1-2 arthropathy. Soft tissues and spinal canal: No prevertebral fluid or swelling. No visible canal hematoma. Disc levels: Diffuse cervical  disc degeneration and facet arthrosis with the latter being asymmetrically advanced on the right in the upper cervical spine. Advanced multilevel neural foraminal stenosis. At least moderate spinal stenosis at C3-4. Upper chest: Included lung apices are clear. Other: Asymmetrically extensive calcified atherosclerosis of the right common and internal carotid arteries. IMPRESSION: 1. No evidence of acute intracranial abnormality or cervical spine fracture. 2. Cerebral atrophy and chronic ischemia with multiple old infarcts. Electronically Signed   By: Sebastian Ache M.D.   On: 10/11/2023 11:47   CT Cervical Spine Wo Contrast  Result Date: 10/11/2023 CLINICAL DATA:  Mental status change, unknown cause; Neck trauma (Age >= 65y). EXAM: CT HEAD WITHOUT CONTRAST CT CERVICAL SPINE WITHOUT CONTRAST TECHNIQUE: Multidetector CT imaging of the head and cervical spine was performed following the standard protocol without intravenous contrast. Multiplanar CT image reconstructions of the cervical spine were also generated. RADIATION DOSE REDUCTION: This exam was performed according to the departmental dose-optimization program which includes automated exposure control, adjustment of the mA and/or kV according to patient size and/or use of iterative reconstruction technique. COMPARISON:  CT head 09/12/2023. MRI head 09/13/2023. CT cervical spine 12/02/2019. FINDINGS: CT HEAD FINDINGS Brain: There is no evidence of an acute infarct, intracranial hemorrhage, mass, midline shift, or extra-axial fluid collection. Moderate-sized chronic left frontoparietal and small chronic right parietal and bilateral occipital cortical infarcts are again seen. There is a background of moderate chronic small vessel ischemia in the cerebral white matter with chronic lacunar infarcts in the left corona radiata and basal ganglia. There is moderate cerebral atrophy. Vascular: Calcified atherosclerosis at the skull base. No hyperdense vessel. Skull: No  acute fracture or suspicious osseous lesion. Sinuses/Orbits: Visualized paranasal sinuses and mastoid air cells are clear. Bilateral cataract extraction. Other: None. CT CERVICAL SPINE FINDINGS Alignment: Chronic reversal of the normal cervical lordosis with unchanged grade 1 anterolisthesis of C3 on C4 and C7 on T1. Skull base and vertebrae: No acute fracture or suspicious osseous lesion. Remote bilateral first rib fractures. Moderate median C1-2 arthropathy. Soft tissues and spinal canal: No prevertebral fluid or swelling. No visible canal hematoma. Disc levels: Diffuse cervical disc degeneration and facet arthrosis with the latter being asymmetrically advanced on the right in the upper cervical spine. Advanced multilevel neural foraminal stenosis. At least moderate spinal stenosis at C3-4. Upper chest: Included lung apices are clear. Other: Asymmetrically  extensive calcified atherosclerosis of the right common and internal carotid arteries. IMPRESSION: 1. No evidence of acute intracranial abnormality or cervical spine fracture. 2. Cerebral atrophy and chronic ischemia with multiple old infarcts. Electronically Signed   By: Sebastian Ache M.D.   On: 10/11/2023 11:47    EKG: Independently reviewed.  Sinus rhythm, no acute ST changes.  Assessment/Plan Principal Problem:   UTI (urinary tract infection) Active Problems:   Generalized weakness  (please populate well all problems here in Problem List. (For example, if patient is on BP meds at home and you resume or decide to hold them, it is a problem that needs to be her. Same for CAD, COPD, HLD and so on)  Complicated UTI with microscopic hematuria -Risk of ESBL UTI is high given the recent resistant UTI infection.  ED pharmacy recommended meropenem.  Patient has history of seizure disorder and is on Keppra. -Check PVR x3 and start patient on Flomax -Consult case management to facilitate outpatient urology follow-up for urodynamic  study  Leukocytosis -Secondary to UTI, no signs of sepsis.  Chronic dysphagia -Continue pured diet with honey thick liquid  Severe dehydration -IV fluid and reevaluate.  PAF -Sinus rhythm, continue Eliquis  COPD -Stable, no acute concern  Recent upper GI bleed -Stable, H&H stable, continue PPI  Deconditioning -PT OT evaluation  DVT prophylaxis: Eliquis Code Status: DNR Family Communication: Caregiver on phone Disposition Plan: Patient is sick with likely a ESBL UTI, requiring IV antibiotics, expect more than 2 midnight hospital stay. Consults called: None Admission status: MedSurg admission   Emeline General MD [facility] Pager [phone] 10/11/2023, 3:43 PM

## 2023-10-11 NOTE — ED Notes (Signed)
Pt provided peri care. Brief and pad changed under pt.

## 2023-10-11 NOTE — ED Notes (Signed)
Pt to CT at this time.

## 2023-10-11 NOTE — ED Provider Notes (Signed)
[facility] Provider Note    Event Date/Time   First MD Initiated Contact with Patient 10/11/23 1031     (approximate)   History   Chief Complaint Back Pain   HPI  Richard Carter is a 82 y.o. male with past medical history of hypertension, hyperlipidemia, stroke with dysphagia and right-sided deficits, seizures, atrial fibrillation on Eliquis, COPD, and dementia who presents to the ED for back pain.  Caregiver at bedside states that patient started complaining of severe pain in his back earlier this morning.  Caregiver states that patient does not usually complain of back pain, did have a fall about 1 month ago where he rolled off of the couch, but only began complaining of back pain this morning.  Patient now states that his back was hurting him earlier, but is not currently painful.  He does complain of some pain in his right hand, denies any other areas of pain.  Caregiver states that he is mostly at his baseline mental status, does state he is slightly "groggier" than usual.     Physical Exam   Triage Vital Signs: ED Triage Vitals  Encounter Vitals Group     BP 10/11/23 0938 (!) 148/81     Systolic BP Percentile --      Diastolic BP Percentile --      Pulse Rate 10/11/23 0938 (!) 49     Resp 10/11/23 0938 18     Temp 10/11/23 0938 97.7 F (36.5 C)     Temp Source 10/11/23 0938 Oral     SpO2 10/11/23 0938 98 %     Weight 10/11/23 0947 137 lb (62.1 kg)     Height 10/11/23 0947 5\' 8"  (1.727 m)     Head Circumference --      Peak Flow --      Pain Score --      Pain Loc --      Pain Education --      Exclude from Growth Chart --     Most recent vital signs: Vitals:   10/11/23 1000 10/11/23 1300  BP: 134/79 (!) 163/92  Pulse: 83 70  Resp:  18  Temp:    SpO2:  99%    Constitutional: Alert and oriented to person and place, but not time or situation. Eyes: Conjunctivae are normal. Head: Atraumatic. Nose: No  congestion/rhinnorhea. Mouth/Throat: Mucous membranes are moist.  Neck: No midline cervical spine tenderness to palpation. Cardiovascular: Normal rate, regular rhythm. Grossly normal heart sounds.  2+ radial pulses bilaterally. Respiratory: Normal respiratory effort.  No retractions. Lungs CTAB.  No chest wall tenderness to palpation. Gastrointestinal: Soft and nontender. No distention. Musculoskeletal: No lower extremity tenderness nor edema.  Diffuse tenderness to palpation of right hand with no obvious deformity, otherwise no bony tenderness to upper extremities.  Mild midline lumbar spinal tenderness to palpation noted.  No midline thoracic spinal tenderness. Neurologic: Slurred speech with right-sided weakness noted.    ED Results / Procedures / Treatments   Labs (all labs ordered are listed, but only abnormal results are displayed) Labs Reviewed  CBC WITH DIFFERENTIAL/PLATELET - Abnormal; Notable for the following components:      Result Value   WBC 13.8 (*)    RBC 3.80 (*)    Hemoglobin 11.1 (*)    HCT 35.0 (*)    Neutro Abs 11.1 (*)    Monocytes Absolute 1.1 (*)    All other components within normal limits  URINALYSIS, ROUTINE W  REFLEX MICROSCOPIC - Abnormal; Notable for the following components:   Color, Urine YELLOW (*)    APPearance TURBID (*)    Hgb urine dipstick LARGE (*)    Protein, ur 100 (*)    Nitrite POSITIVE (*)    Leukocytes,Ua MODERATE (*)    All other components within normal limits  URINE CULTURE  COMPREHENSIVE METABOLIC PANEL  LIPASE, BLOOD     EKG  ED ECG REPORT I, Chesley Noon, the attending physician, personally viewed and interpreted this ECG.   Date: 10/11/2023  EKG Time: 12:08  Rate: 75  Rhythm: normal sinus rhythm  Axis: LAD  Intervals:right bundle branch block  ST&T Change: None  RADIOLOGY CT head reviewed and interpreted by me with no hemorrhage or midline shift.  PROCEDURES:  Critical Care performed:  No  Procedures   MEDICATIONS ORDERED IN ED: Medications  lidocaine (XYLOCAINE) 2 % jelly 1 Application (1 Application Urethral Given 10/11/23 1441)     IMPRESSION / MDM / ASSESSMENT AND PLAN / ED COURSE  I reviewed the triage vital signs and the nursing notes.                              82 y.o. male with past medical history of hypertension, hyperlipidemia, stroke with dysphagia and right-sided deficits, seizures, COPD, atrial fibrillation on Eliquis, and dementia who presents to the ED with episode of back pain this morning, caregiver reports a fall about 1 month ago.  Patient's presentation is most consistent with acute presentation with potential threat to life or bodily function.  Differential diagnosis includes, but is not limited to, lumbar injury, hand injury, stroke, TIA, electrolyte abnormality, AKI, anemia, UTI.  Patient chronically ill but nontoxic-appearing and in no acute distress, vital signs remarkable for bradycardia but otherwise reassuring.  While he apparently had significant back pain earlier, he now denies any pain in his back, has only mild tenderness of his midline lumbar spine.  Caregiver also states that he is holding his right hand different, patient states this is due to pain.  We will check x-rays of his lumbar spine and right hand.  Caregiver does state that he is less alert than usual, neurologic deficits appear unchanged from previous but will check CT head and cervical spine given recent fall.  Labs and urinalysis are pending.  CT head and cervical spine are negative for acute process, lumbar spine x-ray and hand x-ray are also unremarkable.  Labs with mild leukocytosis but no significant anemia, electrolyte abnormality, or AKI.  LFTs and lipase are unremarkable, but urinalysis appears concerning for infection.  Patient continues to deny pain on reassessment, however family and caregiver reporting change in mental status and patient has a history of ESBL E.  coli in his urine.  In discussion with pharmacy, we will initiate meropenem for suspected ESBL UTI.  Case discussed with hospitalist for admission.      FINAL CLINICAL IMPRESSION(S) / ED DIAGNOSES   Final diagnoses:  Acute cystitis without hematuria  History of ESBL E. coli infection     Rx / DC Orders   ED Discharge Orders     None        Note:  This document was prepared using Dragon voice recognition software and may include unintentional dictation errors.   Chesley Noon, MD 10/11/23 [phone]

## 2023-10-12 DIAGNOSIS — N3000 Acute cystitis without hematuria: Secondary | ICD-10-CM

## 2023-10-12 DIAGNOSIS — R531 Weakness: Secondary | ICD-10-CM | POA: Diagnosis not present

## 2023-10-12 DIAGNOSIS — R4701 Aphasia: Secondary | ICD-10-CM | POA: Diagnosis not present

## 2023-10-12 LAB — CBC
HCT: 30.8 % — ABNORMAL LOW (ref 39.0–52.0)
Hemoglobin: 10.3 g/dL — ABNORMAL LOW (ref 13.0–17.0)
MCH: 29.9 pg (ref 26.0–34.0)
MCHC: 33.4 g/dL (ref 30.0–36.0)
MCV: 89.3 fL (ref 80.0–100.0)
Platelets: 289 10*3/uL (ref 150–400)
RBC: 3.45 MIL/uL — ABNORMAL LOW (ref 4.22–5.81)
RDW: 15 % (ref 11.5–15.5)
WBC: 11.2 10*3/uL — ABNORMAL HIGH (ref 4.0–10.5)
nRBC: 0 % (ref 0.0–0.2)

## 2023-10-12 MED ORDER — PIPERACILLIN-TAZOBACTAM 3.375 G IVPB
3.3750 g | Freq: Three times a day (TID) | INTRAVENOUS | Status: DC
  Administered 2023-10-12 – 2023-10-13 (×4): 3.375 g via INTRAVENOUS
  Filled 2023-10-12 (×5): qty 50

## 2023-10-12 MED ORDER — SODIUM CHLORIDE 0.9 % IV SOLN: Freq: Once | INTRAVENOUS | Status: AC

## 2023-10-12 MED ORDER — OMEPRAZOLE 20 MG PO TBDD
20.0000 mg | DELAYED_RELEASE_TABLET | Freq: Two times a day (BID) | ORAL | Status: DC
  Administered 2023-10-12 – 2023-10-13 (×3): 20 mg via ORAL
  Filled 2023-10-12 (×3): qty 1

## 2023-10-12 NOTE — Evaluation (Signed)
Occupational Therapy Evaluation Patient Details Name: Richard Carter MRN: [id number] DOB: October 13, 1941 Today's Date: 10/12/2023   History of Present Illness Pt is an 82 y/o M admitted on 10/11/23 after being brought in by a caregiver for evaluation of worsening general weakness & falls. Lumbar spine x-ray showed chronic L1 compression fracture. Pt is being treated for UTI. PMH: CVA with residual dysphagia, seizure disorders, advanced dementia, PAF on eliquis, HTN, recent GI bleed, COPD, BPH, frequent UTIs   Clinical Impression   Pt was seen for OT evaluation this date and co-tx with PT. Pt initially very sleepy, alertness improving once upright EOB requiring heavy MAX A +@ for bed mobility and progressing to close supv for seated static balance with time. Pt requires MAX A for LB ADL, MIN-MOD A for UB ADL, MIN A for self feeding, and MIN A for seated grooming EOB. MAX A +2 for  squat pivot transfer to the recliner.  Pt demonstrates impaired strength, balance, activity tolerance, and cognition requiring increased assist from baseline. Pt would benefit from skilled OT services to address noted impairments and functional limitations (see below for any additional details) in order to maximize safety and independence while minimizing falls risk and caregiver burden.     If plan is discharge home, recommend the following: Two people to help with walking and/or transfers;A lot of help with bathing/dressing/bathroom;Assistance with cooking/housework;Assist for transportation;Help with stairs or ramp for entrance;Direct supervision/assist for medications management;Assistance with feeding;Direct supervision/assist for financial management;Supervision due to cognitive status    Functional Status Assessment  Patient has had a recent decline in their functional status and demonstrates the ability to make significant improvements in function in a reasonable and predictable amount of time.  Equipment  Recommendations  Other (comment);Hospital bed (defer to next venue, anticipate would benefit from hospital bed)    Recommendations for Other Services       Precautions / Restrictions Precautions Precautions: Fall Restrictions Weight Bearing Restrictions: No      Mobility Bed Mobility Overal bed mobility: Needs Assistance Bed Mobility: Supine to Sit     Supine to sit: Max assist, +2 for physical assistance, +2 for safety/equipment, Used rails, HOB elevated          Transfers Overall transfer level: Needs assistance Equipment used: 2 person hand held assist Transfers: Bed to chair/wheelchair/BSC, Sit to/from Stand Sit to Stand: Max assist, +2 physical assistance (STS from EOB with RW, then with +2 assist, significant posterior lean on EOB, unable to shift pelvis anteriorly)   Squat pivot transfers: Max assist, +2 physical assistance (bed>recliner on L)              Balance Overall balance assessment: Needs assistance Sitting-balance support: Feet supported, Bilateral upper extremity supported Sitting balance-Leahy Scale: Fair Sitting balance - Comments: max fade to supervision for static sitting   Standing balance support: During functional activity, Bilateral upper extremity supported Standing balance-Leahy Scale: Zero                             ADL either performed or assessed with clinical judgement   ADL Overall ADL's : Needs assistance/impaired Eating/Feeding: Sitting;Set up;Minimal assistance Eating/Feeding Details (indicate cue type and reason): assist for drinking to hold cup and bring to mouth (thickened liquids more difficult to get out), able to use fork in R hand to take a few bites. Initial sips of juice resulted in wet cough. After that pt was able to eat  pureed waffles and thickened milk without noted coughing. Grooming: Minimal assistance;Oral care Grooming Details (indicate cue type and reason): MIN A for denture mgt, no lower dentures  present                                     Vision         Perception         Praxis         Pertinent Vitals/Pain Pain Assessment Pain Assessment: Faces Faces Pain Scale: No hurt     Extremity/Trunk Assessment Upper Extremity Assessment Upper Extremity Assessment: Generalized weakness   Lower Extremity Assessment Lower Extremity Assessment: Generalized weakness   Cervical / Trunk Assessment Cervical / Trunk Assessment: Kyphotic   Communication Communication Communication: Difficulty communicating thoughts/reduced clarity of speech Cueing Techniques: Verbal cues;Tactile cues   Cognition Arousal: Alert Behavior During Therapy: Flat affect Overall Cognitive Status: History of cognitive impairments - at baseline                                 General Comments: Pt with hx of dementia, reports his age is 82 y/o vs 82 y/o, follows simple commands inconsistently with extra time.     General Comments  Assisted pt with drinking - pt coughing & MD made aware of possible need for SLP consult.    Exercises     Shoulder Instructions      Home Living Family/patient expects to be discharged to:: Private residence Living Arrangements: Alone Available Help at Discharge: Other (Comment) Type of Home: Mobile home Home Access: Stairs to enter Entrance Stairs-Number of Steps: 2 Entrance Stairs-Rails: Left Home Layout: One level     Bathroom Shower/Tub: Walk-in shower         Home Equipment: Grab bars - tub/shower;Shower seat;Grab bars - Geophysicist/field seismologist (2 wheels);Wheelchair - manual          Prior Functioning/Environment Prior Level of Function : Needs assist;History of Falls (last six months) (all information taken from previous chart entry)             Mobility Comments: pt uses RW for household distances per chart review and patient reports ADLs Comments: Richard Carter assisting with all ADL        OT Problem  List: Decreased strength;Decreased activity tolerance;Decreased cognition;Decreased safety awareness;Impaired balance (sitting and/or standing);Decreased knowledge of use of DME or AE      OT Treatment/Interventions: Self-care/ADL training;Therapeutic exercise;Therapeutic activities;DME and/or AE instruction;Patient/family education;Balance training;Cognitive remediation/compensation    OT Goals(Current goals can be found in the care plan section) Acute Rehab OT Goals Patient Stated Goal: go home OT Goal Formulation: With patient Time For Goal Achievement: 10/26/23 Potential to Achieve Goals: Fair ADL Goals Pt Will Perform Upper Body Dressing: with caregiver independent in assisting;sitting Pt Will Perform Lower Body Dressing: sit to/from stand;with caregiver independent in assisting Pt Will Transfer to Toilet: bedside commode (LRAD, caregiver indep in assisting) Pt Will Perform Toileting - Clothing Manipulation and hygiene: with caregiver independent in assisting Additional ADL Goal #1: Pt will complete familiar/routine seated table top ADL task with setup and PRN VC for safety/sequencing, 2/2 opportunities.  OT Frequency: Min 1X/week    Co-evaluation PT/OT/SLP Co-Evaluation/Treatment: Yes Reason for Co-Treatment: Necessary to address cognition/behavior during functional activity;For patient/therapist safety PT goals addressed during session: Mobility/safety with mobility;Balance OT goals addressed during session: ADL's  and self-care      AM-PAC OT "6 Clicks" Daily Activity     Outcome Measure Help from another person eating meals?: A Little Help from another person taking care of personal grooming?: A Lot Help from another person toileting, which includes using toliet, bedpan, or urinal?: Total Help from another person bathing (including washing, rinsing, drying)?: A Lot Help from another person to put on and taking off regular upper body clothing?: A Lot Help from another person to  put on and taking off regular lower body clothing?: A Lot 6 Click Score: 12   End of Session Nurse Communication: Mobility status  Activity Tolerance: Patient tolerated treatment well Patient left: in chair;with call bell/phone within reach;with chair alarm set  OT Visit Diagnosis: Other abnormalities of gait and mobility (R26.89);Muscle weakness (generalized) (M62.81)                Time: 8295-6213 OT Time Calculation (min): 25 min Charges:  OT General Charges $OT Visit: 1 Visit OT Evaluation $OT Eval Moderate Complexity: 1 Mod  Arman Filter., MPH, MS, OTR/L ascom [phone] 10/12/23, 3:43 PM

## 2023-10-12 NOTE — TOC Initial Note (Signed)
Transition of Care [facility]) - Initial/Assessment Note    Patient Details  Name: Richard Carter MRN: [id number] Date of Birth: 09/29/1941  [facility] CM/SW Contact:    Luvenia Redden, RN Phone Number: 10/12/2023, 9:21 AM  Clinical Narrative:                 Request order for PCP. TOC RN spoke with son [name] who confirms:  PCP ([facility]) [facility]: [facility] [address] Pt lives alone and son [name]) lives across the street. Pt has all the DME required from previous admissions. Pt is extreme high risk via his admission rate. No requested needs or services at this time for SDOH.  TOC will continue to address any requested needs for discharge disposition.    Expected Discharge Plan: Home/Self Care Barriers to Discharge: Continued Medical Work up   Patient Goals and CMS Choice            Expected Discharge Plan and Services In-house Referral: Clinical Social Work     Living arrangements for the past 2 months: Single Family Home                                      Prior Living Arrangements/Services Living arrangements for the past 2 months: Single Family Home Lives with:: Self (Son lives across the [place]) Patient language and need for interpreter reviewed:: Yes Do you feel safe going back to the place where you live?: Yes      Need for Family Participation in Patient Care: Yes (Comment) Care giver support system in place?: Yes (comment)   Criminal Activity/Legal Involvement Pertinent to Current Situation/Hospitalization: No - Comment as needed  Activities of Daily Living   ADL Screening (condition at time of admission) Independently performs ADLs?: No Does the patient have a NEW difficulty with bathing/dressing/toileting/self-feeding that is expected to last >3 days?: Yes (Initiates electronic notice to provider for possible OT consult) Does the patient have a NEW difficulty with getting in/out of bed,  walking, or climbing stairs that is expected to last >3 days?: Yes (Initiates electronic notice to provider for possible PT consult) Does the patient have a NEW difficulty with communication that is expected to last >3 days?: Yes (Initiates electronic notice to provider for possible SLP consult) Is the patient deaf or have difficulty hearing?: Yes Does the patient have difficulty seeing, even when wearing glasses/contacts?: Yes Does the patient have difficulty concentrating, remembering, or making decisions?: Yes  Permission Sought/Granted                  Emotional Assessment   Attitude/Demeanor/Rapport: Engaged Affect (typically observed): Accepting Orientation: : Oriented to Place, Oriented to Self   Psych Involvement: No (comment)  Admission diagnosis:  UTI (urinary tract infection) [N39.0] Acute cystitis without hematuria [N30.00] History of ESBL E. coli infection [Z86.19] Patient Active Problem List   Diagnosis Date Noted   Generalized weakness 10/11/2023   Acute upper GI bleed 09/12/2023   Expressive aphasia 09/12/2023   HCAP (healthcare-associated pneumonia) 09/04/2023   History of ESBL E. coli UTI 06/2023 09/04/2023   Esophagitis 09/04/2023   Acute cystitis 09/04/2023   Sepsis (HCC) 09/04/2023   Hypokalemia 07/16/2023   Seizure disorder (HCC) 07/11/2023   History of alcohol use disorder 05/31/2023   Goals of care, counseling/discussion 04/03/2023   Malnutrition of moderate degree 04/03/2023   Stroke (HCC) 03/27/2023  Stenosis of left carotid artery s/p endarterectomy 02/20/2023   Altered mental status 02/20/2023   Acute stroke due to ischemia (HCC) 02/17/2023   Stroke-like symptom 02/16/2023   Leukocytosis 02/16/2023   Anemia of chronic disease 07/22/2022   Protein-calorie malnutrition, severe 05/28/2022   Dysphagia as late effect of cerebrovascular accident (CVA) 05/28/2022   Acute metabolic encephalopathy 05/27/2022   History of stroke 05/27/2022    Aspiration pneumonia (HCC) 04/23/2022   Nicotine dependence    Elevated troponin    Acute CVA (cerebrovascular accident) (HCC) 05/07/2021   Non compliance w medication regimen 05/07/2021   Dementia (HCC) 05/07/2021   AF (paroxysmal atrial fibrillation) (HCC) 05/07/2021   HTN (hypertension) 11/29/2020   Alcohol abuse 11/29/2020   AKI (acute kidney injury) (HCC) 11/29/2020   Atrial flutter with rapid ventricular response (HCC) 11/29/2020   Seizures (HCC) 02/22/2020   Hypomagnesemia 10/29/2019   Pubic ramus fracture (HCC) 07/29/2019   Acetabular fracture (HCC) 07/29/2019   HLD (hyperlipidemia) 07/29/2019   GERD (gastroesophageal reflux disease) 07/29/2019   UTI (urinary tract infection) 07/29/2019   Vitamin D deficiency 09/29/2015   PCP:  Marisue Ivan, MD Pharmacy:   [facility] [address] (N), [place] - [address] [place] ([place]) [place] [address] Phone: [phone] Fax: [phone]     Social Determinants of Health (SDOH) Social History: SDOH Screenings   Food Insecurity: Unknown (10/11/2023)  Housing: Patient Declined (10/11/2023)  Transportation Needs: Unknown (10/11/2023)  Utilities: Patient Unable To Answer (10/11/2023)  Depression (PHQ2-9): Low Risk  (09/18/2023)  Financial Resource Strain: Low Risk  (08/11/2023)   Received from [facility]  Tobacco Use: Medium Risk (10/11/2023)   SDOH Interventions:     Readmission Risk Interventions    09/13/2023    3:30 PM 09/05/2023    1:05 PM 04/21/2022    1:55 PM  Readmission Risk Prevention Plan  Post Dischage Appt   Complete  Medication Screening   Complete  Transportation Screening Complete Complete Complete  Medication Review (RN Care Manager) Complete Complete   PCP or Specialist appointment within 3-5 days of discharge Complete --   HRI or Home Care Consult Complete Complete   SW Recovery Care/Counseling Consult Complete Complete    Palliative Care Screening Not Applicable Not Applicable   Skilled Nursing Facility Complete Not Applicable

## 2023-10-12 NOTE — Progress Notes (Signed)
[facility]  - [place] [facility]   PATIENT NAME: Richard Carter    MR#:  [id number]  DATE OF BIRTH:  22-Jul-1941  SUBJECTIVE:  seen earlier. No family at bedside. Patient resting quietly. Worked earlier with speech language pathology    VITALS:  Blood pressure 134/64, pulse (!) 50, temperature 98.8 F (37.1 C), resp. rate 20, height 5\' 8"  (1.727 m), weight 62.1 kg, SpO2 99%.  PHYSICAL EXAMINATION:   GENERAL:  82 y.o.-year-old patient with no acute distress. Chronically ill LUNGS: Normal breath sounds bilaterally, CARDIOVASCULAR: S1, S2 normal. No murmur   ABDOMEN: Soft, nontender, nondistended.  EXTREMITIES: No  edema b/l.    NEUROLOGIC: nonfocal  patient is alert and awake overall week   LABORATORY PANEL:  CBC Recent Labs  Lab 10/12/23 0445  WBC 11.2*  HGB 10.3*  HCT 30.8*  PLT 289    Chemistries  Recent Labs  Lab 10/11/23 1202  NA 141  K 3.9  CL 110  CO2 22  GLUCOSE 91  BUN 17  CREATININE 0.97  CALCIUM 9.9  AST 19  ALT 12  ALKPHOS 104  BILITOT 0.9   Cardiac Enzymes No results for input(s): "TROPONINI" in the last 168 hours. RADIOLOGY:  DG Lumbar Spine 2-3 Views  Result Date: 10/11/2023 CLINICAL DATA:  Low back pain. EXAM: LUMBAR SPINE - 2-3 VIEW COMPARISON:  Lumbar spine MRI 11/29/2020 FINDINGS: There are 5 non rib-bearing lumbar type vertebrae. There is mild-to-moderate lumbar levoscoliosis without significant listhesis. A chronic L1 compression fracture is unchanged. No acute fracture is identified. Severe disc space narrowing is again seen at L3-4 and L4-5 with mild narrowing at L2-3. Moderate mid and lower lumbar facet arthrosis and abdominal aortic atherosclerosis are noted. IMPRESSION: 1. No acute osseous abnormality. 2. Chronic L1 compression fracture. 3. Levoscoliosis with disc and facet degeneration. Electronically Signed   By: Sebastian Ache M.D.   On: 10/11/2023 11:55   DG Hand Complete Right  Result Date:  10/11/2023 CLINICAL DATA:  Fall, right hand pain EXAM: RIGHT HAND - COMPLETE 3+ VIEW COMPARISON:  None Available. FINDINGS: Diffuse osseous demineralization. No evidence of an acute fracture. No dislocation. Degenerative changes of the hand and wrist are most pronounced at the first [facility] joint. No focal soft tissue swelling is evident. Vascular calcifications noted. IMPRESSION: 1. No acute fracture or dislocation of the right hand. 2. Degenerative changes of the hand and wrist. Electronically Signed   By: Duanne Guess D.O.   On: 10/11/2023 11:54   CT Head Wo Contrast  Result Date: 10/11/2023 CLINICAL DATA:  Mental status change, unknown cause; Neck trauma (Age >= 65y). EXAM: CT HEAD WITHOUT CONTRAST CT CERVICAL SPINE WITHOUT CONTRAST TECHNIQUE: Multidetector CT imaging of the head and cervical spine was performed following the standard protocol without intravenous contrast. Multiplanar CT image reconstructions of the cervical spine were also generated. RADIATION DOSE REDUCTION: This exam was performed according to the departmental dose-optimization program which includes automated exposure control, adjustment of the mA and/or kV according to patient size and/or use of iterative reconstruction technique. COMPARISON:  CT head 09/12/2023. MRI head 09/13/2023. CT cervical spine 12/02/2019. FINDINGS: CT HEAD FINDINGS Brain: There is no evidence of an acute infarct, intracranial hemorrhage, mass, midline shift, or extra-axial fluid collection. Moderate-sized chronic left frontoparietal and small chronic right parietal and bilateral occipital cortical infarcts are again seen. There is a background of moderate chronic small vessel ischemia in the cerebral white matter with chronic lacunar infarcts in the left corona  radiata and basal ganglia. There is moderate cerebral atrophy. Vascular: Calcified atherosclerosis at the skull base. No hyperdense vessel. Skull: No acute fracture or suspicious osseous lesion.  Sinuses/Orbits: Visualized paranasal sinuses and mastoid air cells are clear. Bilateral cataract extraction. Other: None. CT CERVICAL SPINE FINDINGS Alignment: Chronic reversal of the normal cervical lordosis with unchanged grade 1 anterolisthesis of C3 on C4 and C7 on T1. Skull base and vertebrae: No acute fracture or suspicious osseous lesion. Remote bilateral first rib fractures. Moderate median C1-2 arthropathy. Soft tissues and spinal canal: No prevertebral fluid or swelling. No visible canal hematoma. Disc levels: Diffuse cervical disc degeneration and facet arthrosis with the latter being asymmetrically advanced on the right in the upper cervical spine. Advanced multilevel neural foraminal stenosis. At least moderate spinal stenosis at C3-4. Upper chest: Included lung apices are clear. Other: Asymmetrically extensive calcified atherosclerosis of the right common and internal carotid arteries. IMPRESSION: 1. No evidence of acute intracranial abnormality or cervical spine fracture. 2. Cerebral atrophy and chronic ischemia with multiple old infarcts. Electronically Signed   By: Sebastian Ache M.D.   On: 10/11/2023 11:47   CT Cervical Spine Wo Contrast  Result Date: 10/11/2023 CLINICAL DATA:  Mental status change, unknown cause; Neck trauma (Age >= 65y). EXAM: CT HEAD WITHOUT CONTRAST CT CERVICAL SPINE WITHOUT CONTRAST TECHNIQUE: Multidetector CT imaging of the head and cervical spine was performed following the standard protocol without intravenous contrast. Multiplanar CT image reconstructions of the cervical spine were also generated. RADIATION DOSE REDUCTION: This exam was performed according to the departmental dose-optimization program which includes automated exposure control, adjustment of the mA and/or kV according to patient size and/or use of iterative reconstruction technique. COMPARISON:  CT head 09/12/2023. MRI head 09/13/2023. CT cervical spine 12/02/2019. FINDINGS: CT HEAD FINDINGS Brain: There  is no evidence of an acute infarct, intracranial hemorrhage, mass, midline shift, or extra-axial fluid collection. Moderate-sized chronic left frontoparietal and small chronic right parietal and bilateral occipital cortical infarcts are again seen. There is a background of moderate chronic small vessel ischemia in the cerebral white matter with chronic lacunar infarcts in the left corona radiata and basal ganglia. There is moderate cerebral atrophy. Vascular: Calcified atherosclerosis at the skull base. No hyperdense vessel. Skull: No acute fracture or suspicious osseous lesion. Sinuses/Orbits: Visualized paranasal sinuses and mastoid air cells are clear. Bilateral cataract extraction. Other: None. CT CERVICAL SPINE FINDINGS Alignment: Chronic reversal of the normal cervical lordosis with unchanged grade 1 anterolisthesis of C3 on C4 and C7 on T1. Skull base and vertebrae: No acute fracture or suspicious osseous lesion. Remote bilateral first rib fractures. Moderate median C1-2 arthropathy. Soft tissues and spinal canal: No prevertebral fluid or swelling. No visible canal hematoma. Disc levels: Diffuse cervical disc degeneration and facet arthrosis with the latter being asymmetrically advanced on the right in the upper cervical spine. Advanced multilevel neural foraminal stenosis. At least moderate spinal stenosis at C3-4. Upper chest: Included lung apices are clear. Other: Asymmetrically extensive calcified atherosclerosis of the right common and internal carotid arteries. IMPRESSION: 1. No evidence of acute intracranial abnormality or cervical spine fracture. 2. Cerebral atrophy and chronic ischemia with multiple old infarcts. Electronically Signed   By: Sebastian Ache M.D.   On: 10/11/2023 11:47    Assessment and Plan  JARAAD FUGITT is a 82 y.o. male with medical history significant of CVA with residual dysphagia on pured diet with honey thick liquid, seizure disorders, advanced dementia, PAF on Eliquis,  HTN, recent GI  bleed, COPD, BPH and frequent UTIs.  Brought in by caregiver for evaluation of worsening general weakness and fall at home.   Symptoms started 3 days ago, patient started develop generalized weakness gradually getting worse and lost appetite completed yesterday became dehydrated even weaker.  This morning patient tried a stand up and ambulate and feeling weak, and fell backward and hit his back, no head or neck injury.  Patient complaining about back pain and right wrist pain.    X-ray in the ED showed no fracture or dislocation on CT head and neck.    Lumbar spine x-ray showed chronic L1 compression fracture. UA s/o UTI  Generalized weakness with nontraumatic fall suspected due to UTI recurrent history of ESBL UTI history of BPH -- according to old urine culture results will start patient on IV Zosyn and adjust antibiotics according to culture sensitivity -- start Flomax and have patient see urology as outpatient  Leukocytosis due to UTI  Chronic dysphagia -- patient to be seen by speech language pathology and continue his pured diet with honey thick liquid --Patient received IV fluids for concerns of dehydration creatinine stable  Paroxysmal a fib -- continue eliquis  COPD -- stable  PT OT recommends rehab. will discussed with POA     Procedures: Family communication :none Consults :none CODE STATUS: DNR DNI DVT Prophylaxis : eliquis Level of care: Med-Surg Status is: Inpatient Remains inpatient appropriate because: UTI weakness    TOTAL TIME TAKING CARE OF THIS PATIENT: 35 minutes.  >50% time spent on counselling and coordination of care  Note: This dictation was prepared with Dragon dictation along with smaller phrase technology. Any transcriptional errors that result from this process are unintentional.  Enedina Finner M.D    [facility]   CC: Primary care physician; Marisue Ivan, MD

## 2023-10-12 NOTE — Evaluation (Signed)
Clinical/Bedside Swallow Evaluation Patient Details  Name: Richard Carter MRN: [id number] Date of Birth: 06-Jan-1941  Today's Date: 10/12/2023 Time: SLP Start Time (ACUTE ONLY): 1220 SLP Stop Time (ACUTE ONLY): 1230 SLP Time Calculation (min) (ACUTE ONLY): 10 min  Past Medical History:  Past Medical History:  Diagnosis Date   Alcohol abuse    Dementia (HCC)    GERD (gastroesophageal reflux disease)    HLD (hyperlipidemia)    HTN (hypertension)    Seizures (HCC)    Stroke [facility])    Past Surgical History:  Past Surgical History:  Procedure Laterality Date   ENDARTERECTOMY Left 04/02/2023   Procedure: ENDARTERECTOMY CAROTID;  Surgeon: Renford Dills, MD;  Location: [facility] ORS;  Service: Vascular;  Laterality: Left;   skin cyst removal     HPI:  Pt is a 82 y.o. male with who presented to ED by caregiver for evaluation of worsening general weakness and fall at home. Pt with medical history significant of CVA with residual dysphagia on pured diet with honey thick liquid, seizure disorders, advanced dementia, PAF on Eliquis, HTN, recent GI bleed, hiatal hernia, COPD, BPH and frequent UTIs. Head CT: 1. No evidence of acute intracranial abnormality or cervical spine  fracture.  2. Cerebral atrophy and chronic ischemia with multiple old infarcts.    Assessment / Plan / Recommendation  Clinical Impression  Pt seen for clinical swallowing evaluation. Pt known well to SLP services from previous hospitalizations with most recent MBSS August 2024 recommending a puree diet with honey-thick liquids (see notes for details). Pt observed with items from breakfast tray as pt able to self-feed the majority of the time with intermittent cues for single sips of liquids. Oral phase was functional with puree textures and honey-thick liquids. No overt s/sx pharyngeal dysphagia noted. However, pt continues to be at increased risk for aspiration/aspiration given mental status, dental status, chronic dysphagia,  multiple medical comorbidities, and intermittent need for assistance with feeding. Recommend continuation of a puree diet with honey-thick liquids with safe swallowing strategies/aspiration precautions/reflux precautions as outlined below. SLP to f/u x1 diet tolerance. Pt may benefit from RD consult given risk for inadequate nutrition/hydration as well as Palliative Care consult given chronicity of dysphagia and multiple medical comorbidities.  SLP Visit Diagnosis: Dysphagia, oropharyngeal phase (R13.12)    Aspiration Risk  Moderate aspiration risk;Risk for inadequate nutrition/hydration    Diet Recommendation Dysphagia 1 (Puree);Honey-thick liquid    Liquid Administration via: Cup;No straw;Spoon Medication Administration: Whole meds with puree Supervision: Patient able to self feed;Staff to assist with self feeding;Intermittent supervision to cue for compensatory strategies Compensations: Minimize environmental distractions;Slow rate;Small sips/bites Postural Changes: Seated upright at 90 degrees;Remain upright for at least 30 minutes after po intake    Other  Recommendations Recommended Consults:  (RD and Palliative Care) Oral Care Recommendations: Oral care QID;Staff/trained caregiver to provide oral care Caregiver Recommendations: Avoid jello, ice cream, thin soups, popsicles;Remove water pitcher    Recommendations for follow up therapy are one component of a multi-disciplinary discharge planning process, led by the attending physician.  Recommendations may be updated based on patient status, additional functional criteria and insurance authorization.  Follow up Recommendations Follow physician's recommendations for discharge plan and follow up therapies         Functional Status Assessment Patient has had a recent decline in their functional status and demonstrates the ability to make significant improvements in function in a reasonable and predictable amount of time.  Frequency and  Duration min 2x/week  2  weeks       Prognosis Prognosis for improved oropharyngeal function: Guarded Barriers to Reach Goals: Severity of deficits;Time post onset      Swallow Study   General Date of Onset: 10/11/23 HPI: Pt is a 82 y.o. male with who presented to ED by caregiver for evaluation of worsening general weakness and fall at home. Pt with medical history significant of CVA with residual dysphagia on pured diet with honey thick liquid, seizure disorders, advanced dementia, PAF on Eliquis, HTN, recent GI bleed, hiatal hernia, COPD, BPH and frequent UTIs. Head CT: 1. No evidence of acute intracranial abnormality or cervical spine  fracture.  2. Cerebral atrophy and chronic ischemia with multiple old infarcts. Type of Study: Bedside Swallow Evaluation Previous Swallow Assessment: known well to SLP services; MBSS 07/17/23 recommended puree and honey-thick liquids Diet Prior to this Study: Dysphagia 1 (pureed);Moderately thick liquids (Level 3, honey thick) Temperature Spikes Noted: No Respiratory Status: Room air History of Recent Intubation: No Behavior/Cognition: Alert;Cooperative Oral Cavity Assessment: Within Functional Limits Oral Care Completed by SLP: No Oral Cavity - Dentition: Dentures, top (missing lower dentition) Vision: Functional for self-feeding Self-Feeding Abilities: Able to feed self;Needs assist;Needs set up (intermittent assist) Patient Positioning: Upright in bed;Upright in chair Baseline Vocal Quality: Normal Volitional Cough: Congested Volitional Swallow: Unable to elicit    Oral/Motor/Sensory Function Overall Oral Motor/Sensory Function: Within functional limits   Ice Chips Ice chips: Not tested   Thin Liquid Thin Liquid: Not tested    Nectar Thick Nectar Thick Liquid: Not tested   Honey Thick Honey Thick Liquid: Within functional limits Presentation: Self fed;Cup Other Comments: cues for single sips   Puree Puree: Within functional  limits Presentation: Self Fed   Solid     Solid: Not tested     Clyde Canterbury, M.S., CCC-SLP Speech-Language Pathologist [facility] [phone] (ASCOM)  Alessandra Bevels Peretz Thieme 10/12/2023,12:47 PM

## 2023-10-12 NOTE — Evaluation (Addendum)
Physical Therapy Evaluation Patient Details Name: Richard Carter MRN: [id number] DOB: [birth year] Today's Date: 10/12/2023  History of Present Illness  Pt is an 82 y/o M admitted on 10/11/23 after being brought in by a caregiver for evaluation of worsening general weakness & falls. Lumbar spine x-ray showed chronic L1 compression fracture. Pt is being treated for UTI. PMH: CVA with residual dysphagia, seizure disorders, advanced dementia, PAF on eliquis, HTN, recent GI bleed, COPD, BPH, frequent UTIs  Clinical Impression  Pt seen for PT evaluation with co-tx with OT. Pt received asleep in bed, requires extra time to awaken. Pt unable to provide PLOF/home set up information, all obtained from previous chart entries. Pt requires max assist +2 for bed mobility, is able to progress to sitting EOB with close supervision, but requires multiple attempts & ultimately max assist +2 for squat pivot bed>recliner.   Pt presents with significant cognitive & physical deficits & requires +2 assist for all mobility at this time. Pt is unsafe to d/c without 24 hr support. Will continue to follow pt acutely to progress mobility as able.  Addendum: PT contacted pt's POA Renaldo Reel) via telephone who reports he provides almost 24 hr care to pt. Pt is usually ambulatory with RW but intermittently requires assistance. Pt lives in a mobile home with ramped entry at the back, has as tub shower with built in seat, grab bars & hand held shower head. Pt also has a w/c, hospital bed, & BSC. [name] assists pt with meals.     If plan is discharge home, recommend the following: Two people to help with walking and/or transfers;Two people to help with bathing/dressing/bathroom;Direct supervision/assist for medications management;Help with stairs or ramp for entrance;Assistance with feeding;Assist for transportation;Assistance with cooking/housework;Direct supervision/assist for financial management;Supervision due to cognitive status    Can travel by private vehicle   No    Equipment Recommendations Hospital bed;Hoyer lift;Wheelchair (measurements PT);Wheelchair cushion (measurements PT)  Recommendations for Other Services       Functional Status Assessment Patient has had a recent decline in their functional status and demonstrates the ability to make significant improvements in function in a reasonable and predictable amount of time.     Precautions / Restrictions Precautions Precautions: Fall Restrictions Weight Bearing Restrictions: No      Mobility  Bed Mobility Overal bed mobility: Needs Assistance Bed Mobility: Supine to Sit     Supine to sit: Max assist, +2 for physical assistance, +2 for safety/equipment, Used rails, HOB elevated          Transfers Overall transfer level: Needs assistance Equipment used: 2 person hand held assist Transfers: Bed to chair/wheelchair/BSC, Sit to/from Stand Sit to Stand: Max assist, +2 physical assistance (STS from EOB with RW, then with +2 assist, significant posterior lean on EOB, unable to shift pelvis anteriorly)     Squat pivot transfers: Max assist, +2 physical assistance (bed>recliner on L)          Ambulation/Gait                  Stairs            Wheelchair Mobility     Tilt Bed    Modified Rankin (Stroke Patients Only)       Balance Overall balance assessment: Needs assistance Sitting-balance support: Feet supported, Bilateral upper extremity supported Sitting balance-Leahy Scale: Fair Sitting balance - Comments: max fade to supervision for static sitting   Standing balance support: During functional activity, Bilateral upper extremity  supported Standing balance-Leahy Scale: Zero                               Pertinent Vitals/Pain Pain Assessment Pain Assessment: Faces Faces Pain Scale: No hurt    Home Living Family/patient expects to be discharged to:: Private residence Living Arrangements:  Alone Available Help at Discharge: Other (Comment) Type of Home: Mobile home Home Access: Stairs to enter Entrance Stairs-Rails: Left Entrance Stairs-Number of Steps: 2   Home Layout: One level Home Equipment: Grab bars - tub/shower;Shower seat;Grab bars - Geophysicist/field seismologist (2 wheels);Wheelchair - manual      Prior Function Prior Level of Function : Needs assist;History of Falls (last six months) (all information taken from previous chart entry)             Mobility Comments: pt uses RW for household distances per chart review and patient reports       Extremity/Trunk Assessment   Upper Extremity Assessment Upper Extremity Assessment: Generalized weakness    Lower Extremity Assessment Lower Extremity Assessment: Generalized weakness    Cervical / Trunk Assessment Cervical / Trunk Assessment: Kyphotic  Communication   Communication Communication: Difficulty communicating thoughts/reduced clarity of speech Cueing Techniques: Verbal cues;Tactile cues  Cognition Arousal: Alert Behavior During Therapy: WFL for tasks assessed/performed Overall Cognitive Status: History of cognitive impairments - at baseline                                 General Comments: Pt with hx of dementia, reports his age is 82 y/o vs 82 y/o, follows simple commands inconsistently with extra time.        General Comments General comments (skin integrity, edema, etc.): Assisted pt with drinking - pt coughing & MD made aware of possible need for SLP consult.    Exercises     Assessment/Plan    PT Assessment Patient needs continued PT services  PT Problem List Decreased strength;Decreased coordination;Decreased range of motion;Decreased activity tolerance;Decreased balance;Decreased mobility;Decreased safety awareness;Decreased knowledge of use of DME;Decreased cognition       PT Treatment Interventions DME instruction;Balance training;Modalities;Gait  training;Neuromuscular re-education;Stair training;Functional mobility training;Therapeutic activities;Therapeutic exercise;Manual techniques;Patient/family education;Wheelchair mobility training    PT Goals (Current goals can be found in the Care Plan section)  Acute Rehab PT Goals PT Goal Formulation: Patient unable to participate in goal setting Time For Goal Achievement: 10/26/23 Potential to Achieve Goals: Poor    Frequency Min 1X/week     Co-evaluation PT/OT/SLP Co-Evaluation/Treatment: Yes Reason for Co-Treatment: Necessary to address cognition/behavior during functional activity;For patient/therapist safety PT goals addressed during session: Mobility/safety with mobility;Balance         AM-PAC PT "6 Clicks" Mobility  Outcome Measure Help needed turning from your back to your side while in a flat bed without using bedrails?: Total Help needed moving from lying on your back to sitting on the side of a flat bed without using bedrails?: Total Help needed moving to and from a bed to a chair (including a wheelchair)?: Total Help needed standing up from a chair using your arms (e.g., wheelchair or bedside chair)?: Total Help needed to walk in hospital room?: Total Help needed climbing 3-5 steps with a railing? : Total 6 Click Score: 6    End of Session   Activity Tolerance: Patient tolerated treatment well Patient left: in chair;with chair alarm set;with call bell/phone within reach Nurse  Communication: Mobility status PT Visit Diagnosis: Muscle weakness (generalized) (M62.81);Difficulty in walking, not elsewhere classified (R26.2);Other abnormalities of gait and mobility (R26.89);Unsteadiness on feet (R26.81)    Time: 1610-9604 PT Time Calculation (min) (ACUTE ONLY): 22 min   Charges:   PT Evaluation $PT Eval Moderate Complexity: 1 Mod   PT General Charges $$ ACUTE PT VISIT: 1 Visit         Aleda Grana, PT, DPT 10/12/23, 3:22 PM   Sandi Mariscal 10/12/2023, 1:20 PM

## 2023-10-12 NOTE — Discharge Instructions (Signed)
Some PCP options in [place] area- not a comprehensive list  [facility]- [phone] [place]- [phone] [facility]- [phone] [facility]- [phone] [facility]- [phone] [place]- [phone]  or [place] [facility] [phone]  

## 2023-10-12 NOTE — Plan of Care (Signed)

## 2023-10-13 DIAGNOSIS — R531 Weakness: Secondary | ICD-10-CM | POA: Diagnosis not present

## 2023-10-13 DIAGNOSIS — R4701 Aphasia: Secondary | ICD-10-CM | POA: Diagnosis not present

## 2023-10-13 DIAGNOSIS — N3000 Acute cystitis without hematuria: Secondary | ICD-10-CM | POA: Diagnosis not present

## 2023-10-13 LAB — URINE CULTURE: Culture: >=100000 — AB

## 2023-10-13 MED ORDER — AMLODIPINE BESYLATE 5 MG PO TABS: 5.0000 mg | ORAL_TABLET | Freq: Every day | ORAL | 1 refills | Status: DC

## 2023-10-13 MED ORDER — AMLODIPINE BESYLATE 5 MG PO TABS
5.0000 mg | ORAL_TABLET | Freq: Every day | ORAL | Status: DC
  Administered 2023-10-13: 5 mg via ORAL
  Filled 2023-10-13: qty 1

## 2023-10-13 MED ORDER — CEPHALEXIN 500 MG PO CAPS
500.0000 mg | ORAL_CAPSULE | Freq: Two times a day (BID) | ORAL | Status: DC
  Administered 2023-10-13: 500 mg via ORAL
  Filled 2023-10-13: qty 1

## 2023-10-13 MED ORDER — APIXABAN 2.5 MG PO TABS: 2.5000 mg | ORAL_TABLET | Freq: Two times a day (BID) | ORAL | 0 refills | Status: DC

## 2023-10-13 MED ORDER — CEPHALEXIN 500 MG PO CAPS: 500.0000 mg | ORAL_CAPSULE | Freq: Two times a day (BID) | ORAL | 0 refills | Status: AC

## 2023-10-13 NOTE — Plan of Care (Signed)

## 2023-10-13 NOTE — TOC Transition Note (Signed)
Transition of Care [facility]) - CM/SW Discharge Note   Patient Details  Name: Richard Carter MRN: [id number] Date of Birth: [birth year]  Transition of Care [facility]) CM/SW Contact:  Allena Katz, LCSW Phone Number: 10/13/2023, 11:51 AM   Clinical Narrative:   Pt's son chris would like to take pt home by car and resume [facility] services with [facility] PT/Speech orders resumed. Cory with [facility] notified.    Final next level of care: Home w Home Health Services Barriers to Discharge: Barriers Resolved   Patient Goals and CMS Choice CMS Medicare.gov Compare Post Acute Care list provided to:: Patient Represenative (must comment) (chris poa)    Discharge Placement                  Patient to be transferred to facility by: chris      Discharge Plan and Services Additional resources added to the After Visit Summary for   In-house Referral: Clinical Social Work                        HH Arranged: PT, Speech Therapy HH Agency: [facility] Date [facility] Agency Contacted: 10/13/23   Representative spoke with at [facility] Agency: cory  Social Determinants of Health (SDOH) Interventions SDOH Screenings   Food Insecurity: Unknown (10/11/2023)  Housing: Patient Declined (10/11/2023)  Transportation Needs: Unknown (10/11/2023)  Utilities: Patient Unable To Answer (10/11/2023)  Depression (PHQ2-9): Low Risk  (09/18/2023)  Financial Resource Strain: Low Risk  (08/11/2023)   Received from [facility]  Tobacco Use: Medium Risk (10/11/2023)     Readmission Risk Interventions    09/13/2023    3:30 PM 09/05/2023    1:05 PM 04/21/2022    1:55 PM  Readmission Risk Prevention Plan  Post Dischage Appt   Complete  Medication Screening   Complete  Transportation Screening Complete Complete Complete  Medication Review Oceanographer) Complete Complete   PCP or Specialist appointment within 3-5 days of discharge Complete --   HRI or Home Care Consult Complete Complete    SW Recovery Care/Counseling Consult Complete Complete   Palliative Care Screening Not Applicable Not Applicable   Skilled Nursing Facility Complete Not Applicable

## 2023-10-13 NOTE — Discharge Summary (Addendum)
Physician Discharge Summary   Patient: Richard Carter MRN: [id number] DOB: 1941/01/13  Admit date:     10/11/2023  Discharge date: 10/13/23  Discharge Physician: Enedina Finner   PCP: Marisue Ivan, MD   Recommendations at discharge:    F/u PCP in 1-2 weeks Urology referral to Dr Apolinar Junes for recurrent UTI, BPH evaluation Dysphagia1, Honey thick  Discharge Diagnoses: Principal Problem:   UTI (urinary tract infection) Active Problems:   Generalized weakness  CHALMUS VANDIJK is a 82 y.o. male with medical history significant of CVA with residual dysphagia on pured diet with honey thick liquid, seizure disorders, advanced dementia, PAF on Eliquis, HTN, recent GI bleed, COPD, BPH and frequent UTIs.  Brought in by caregiver for evaluation of worsening general weakness and fall at home.   Symptoms started 3 days ago, patient started develop generalized weakness gradually getting worse and lost appetite completed yesterday became dehydrated even weaker.  This morning patient tried a stand up and ambulate and feeling weak, and fell backward and hit his back, no head or neck injury.  Patient complaining about back pain and right wrist pain.     X-ray in the ED showed no fracture or dislocation on CT head and neck.     Lumbar spine x-ray showed chronic L1 compression fracture. UA s/o UTI   Generalized weakness with nontraumatic fall suspected due to UTI recurrent history of ESBL UTI history of BPH -- according to old urine culture results will start patient on IV Zosyn and adjust antibiotics according to culture sensitivity UC Kleb Pneumonia--change to po keflex x 7 days --d/w [name]) to see Urology as out pt --  Flomax and have patient see urology as outpatient.   Leukocytosis due to UTI   Chronic dysphagia -- patient to be seen by speech language pathology and continue his pured diet with honey thick liquid --Patient received IV fluids for concerns of dehydration creatinine  stable   Paroxysmal a fib -- continue eliquis   COPD -- stable   PT OT recommends rehab. Per POA chris pt is not much active at home and he helps him with ADL's. Pt will not be able to do intense rehab due to his chronic co-morbidities Will d/c to home with [facility] Palliative care referral made to f/u out pt      Disposition: Home health Diet recommendation:  Dysphagia type 1 honey Liquid DISCHARGE MEDICATION: Allergies as of 10/13/2023   No Known Allergies      Medication List     TAKE these medications    acetaminophen 500 MG tablet Commonly known as: TYLENOL Take 1,000 mg by mouth 2 (two) times daily as needed for moderate pain, fever or headache.   albuterol (2.5 MG/3ML) 0.083% nebulizer solution Commonly known as: PROVENTIL Take 3 mLs (2.5 mg total) by nebulization every 6 (six) hours as needed for wheezing or shortness of breath.   apixaban 2.5 MG Tabs tablet Commonly known as: ELIQUIS Take 1 tablet (2.5 mg total) by mouth 2 (two) times daily. What changed: Another medication with the same name was removed. Continue taking this medication, and follow the directions you see here.   atorvastatin 20 MG tablet Commonly known as: LIPITOR Take 20 mg by mouth every evening.   bisacodyl 5 MG EC tablet Commonly known as: DULCOLAX Take 1 tablet (5 mg total) by mouth daily as needed for severe constipation.   cephALEXin 500 MG capsule Commonly known as: KEFLEX Take 1 capsule (500 mg total) by  mouth every 12 (twelve) hours for 6 days.   Compressor/Nebulizer Misc Use as directed for nebulized medications   ferrous sulfate 325 (65 FE) MG EC tablet Take 1 tablet (325 mg total) by mouth daily with breakfast.   food thickener Liqd Commonly known as: SIMPLYTHICK (HONEY/LEVEL 3/MODERATELY THICK) Take 1 packet by mouth as needed.   levETIRAcetam 500 MG tablet Commonly known as: KEPPRA Take 1 tablet (500 mg total) by mouth 2 (two) times daily.   ondansetron 4 MG  disintegrating tablet Commonly known as: ZOFRAN-ODT Take 4 mg by mouth every 8 (eight) hours as needed for nausea or vomiting.   pantoprazole 40 MG tablet Commonly known as: Protonix Take 1 tablet (40 mg total) by mouth 2 (two) times daily.   senna-docusate 8.6-50 MG tablet Commonly known as: Senokot-S Take 1 tablet by mouth at bedtime.   tamsulosin 0.4 MG Caps capsule Commonly known as: FLOMAX Take 0.4 mg by mouth daily.   traZODone 100 MG tablet Commonly known as: DESYREL Take 1 tablet (100 mg total) by mouth at bedtime.   vitamin B-12 500 MCG tablet Commonly known as: CYANOCOBALAMIN Take 500 mcg by mouth daily.        Follow-up Information     Marisue Ivan, MD. Schedule an appointment as soon as possible for a visit in 1 week(s).   Specialty: Family Medicine Contact information: [address] [place] [address] [phone]         Vanna Scotland, MD. Schedule an appointment as soon as possible for a visit in 1 week(s).   Specialty: Urology Why: for BPH, recurrent UTI Contact information: [address] [place] [address] [phone]                Discharge Exam: Ceasar Mons Weights   10/11/23 0947  Weight: 62.1 kg  limited GENERAL:  82 y.o.-year-old patient with no acute distress. Chronically ill LUNGS: Normal breath sounds bilaterally, CARDIOVASCULAR: S1, S2 normal. No murmur   ABDOMEN: Soft, nontender, nondistended.  EXTREMITIES: No  edema b/l.    NEUROLOGIC moves ext ok patient is alert and awake overall weak  Condition at discharge: fair  The results of significant diagnostics from this hospitalization (including imaging, microbiology, ancillary and laboratory) are listed below for reference.   Imaging Studies: DG Lumbar Spine 2-3 Views  Result Date: 10/11/2023 CLINICAL DATA:  Low back pain. EXAM: LUMBAR SPINE - 2-3 VIEW COMPARISON:  Lumbar spine MRI 11/29/2020 FINDINGS: There are 5 non rib-bearing  lumbar type vertebrae. There is mild-to-moderate lumbar levoscoliosis without significant listhesis. A chronic L1 compression fracture is unchanged. No acute fracture is identified. Severe disc space narrowing is again seen at L3-4 and L4-5 with mild narrowing at L2-3. Moderate mid and lower lumbar facet arthrosis and abdominal aortic atherosclerosis are noted. IMPRESSION: 1. No acute osseous abnormality. 2. Chronic L1 compression fracture. 3. Levoscoliosis with disc and facet degeneration. Electronically Signed   By: Sebastian Ache M.D.   On: 10/11/2023 11:55   DG Hand Complete Right  Result Date: 10/11/2023 CLINICAL DATA:  Fall, right hand pain EXAM: RIGHT HAND - COMPLETE 3+ VIEW COMPARISON:  None Available. FINDINGS: Diffuse osseous demineralization. No evidence of an acute fracture. No dislocation. Degenerative changes of the hand and wrist are most pronounced at the first [facility] joint. No focal soft tissue swelling is evident. Vascular calcifications noted. IMPRESSION: 1. No acute fracture or dislocation of the right hand. 2. Degenerative changes of the hand and wrist. Electronically Signed  By: Duanne Guess D.O.   On: 10/11/2023 11:54   CT Head Wo Contrast  Result Date: 10/11/2023 CLINICAL DATA:  Mental status change, unknown cause; Neck trauma (Age >= 65y). EXAM: CT HEAD WITHOUT CONTRAST CT CERVICAL SPINE WITHOUT CONTRAST TECHNIQUE: Multidetector CT imaging of the head and cervical spine was performed following the standard protocol without intravenous contrast. Multiplanar CT image reconstructions of the cervical spine were also generated. RADIATION DOSE REDUCTION: This exam was performed according to the departmental dose-optimization program which includes automated exposure control, adjustment of the mA and/or kV according to patient size and/or use of iterative reconstruction technique. COMPARISON:  CT head 09/12/2023. MRI head 09/13/2023. CT cervical spine 12/02/2019. FINDINGS: CT HEAD  FINDINGS Brain: There is no evidence of an acute infarct, intracranial hemorrhage, mass, midline shift, or extra-axial fluid collection. Moderate-sized chronic left frontoparietal and small chronic right parietal and bilateral occipital cortical infarcts are again seen. There is a background of moderate chronic small vessel ischemia in the cerebral white matter with chronic lacunar infarcts in the left corona radiata and basal ganglia. There is moderate cerebral atrophy. Vascular: Calcified atherosclerosis at the skull base. No hyperdense vessel. Skull: No acute fracture or suspicious osseous lesion. Sinuses/Orbits: Visualized paranasal sinuses and mastoid air cells are clear. Bilateral cataract extraction. Other: None. CT CERVICAL SPINE FINDINGS Alignment: Chronic reversal of the normal cervical lordosis with unchanged grade 1 anterolisthesis of C3 on C4 and C7 on T1. Skull base and vertebrae: No acute fracture or suspicious osseous lesion. Remote bilateral first rib fractures. Moderate median C1-2 arthropathy. Soft tissues and spinal canal: No prevertebral fluid or swelling. No visible canal hematoma. Disc levels: Diffuse cervical disc degeneration and facet arthrosis with the latter being asymmetrically advanced on the right in the upper cervical spine. Advanced multilevel neural foraminal stenosis. At least moderate spinal stenosis at C3-4. Upper chest: Included lung apices are clear. Other: Asymmetrically extensive calcified atherosclerosis of the right common and internal carotid arteries. IMPRESSION: 1. No evidence of acute intracranial abnormality or cervical spine fracture. 2. Cerebral atrophy and chronic ischemia with multiple old infarcts. Electronically Signed   By: Sebastian Ache M.D.   On: 10/11/2023 11:47   CT Cervical Spine Wo Contrast  Result Date: 10/11/2023 CLINICAL DATA:  Mental status change, unknown cause; Neck trauma (Age >= 65y). EXAM: CT HEAD WITHOUT CONTRAST CT CERVICAL SPINE WITHOUT  CONTRAST TECHNIQUE: Multidetector CT imaging of the head and cervical spine was performed following the standard protocol without intravenous contrast. Multiplanar CT image reconstructions of the cervical spine were also generated. RADIATION DOSE REDUCTION: This exam was performed according to the departmental dose-optimization program which includes automated exposure control, adjustment of the mA and/or kV according to patient size and/or use of iterative reconstruction technique. COMPARISON:  CT head 09/12/2023. MRI head 09/13/2023. CT cervical spine 12/02/2019. FINDINGS: CT HEAD FINDINGS Brain: There is no evidence of an acute infarct, intracranial hemorrhage, mass, midline shift, or extra-axial fluid collection. Moderate-sized chronic left frontoparietal and small chronic right parietal and bilateral occipital cortical infarcts are again seen. There is a background of moderate chronic small vessel ischemia in the cerebral white matter with chronic lacunar infarcts in the left corona radiata and basal ganglia. There is moderate cerebral atrophy. Vascular: Calcified atherosclerosis at the skull base. No hyperdense vessel. Skull: No acute fracture or suspicious osseous lesion. Sinuses/Orbits: Visualized paranasal sinuses and mastoid air cells are clear. Bilateral cataract extraction. Other: None. CT CERVICAL SPINE FINDINGS Alignment: Chronic reversal of the normal cervical  lordosis with unchanged grade 1 anterolisthesis of C3 on C4 and C7 on T1. Skull base and vertebrae: No acute fracture or suspicious osseous lesion. Remote bilateral first rib fractures. Moderate median C1-2 arthropathy. Soft tissues and spinal canal: No prevertebral fluid or swelling. No visible canal hematoma. Disc levels: Diffuse cervical disc degeneration and facet arthrosis with the latter being asymmetrically advanced on the right in the upper cervical spine. Advanced multilevel neural foraminal stenosis. At least moderate spinal stenosis  at C3-4. Upper chest: Included lung apices are clear. Other: Asymmetrically extensive calcified atherosclerosis of the right common and internal carotid arteries. IMPRESSION: 1. No evidence of acute intracranial abnormality or cervical spine fracture. 2. Cerebral atrophy and chronic ischemia with multiple old infarcts. Electronically Signed   By: Sebastian Ache M.D.   On: 10/11/2023 11:47   MR BRAIN WO CONTRAST  Result Date: 09/13/2023 CLINICAL DATA:  Neuro deficit, acute, stroke suspected EXAM: MRI HEAD WITHOUT CONTRAST TECHNIQUE: Multiplanar, multiecho pulse sequences of the brain and surrounding structures were obtained without intravenous contrast. COMPARISON:  CT head October 18, 24. MRI head August 17, 24. FINDINGS: Brain: No acute infarction, hemorrhage, hydrocephalus, extra-axial collection or mass lesion. Remote infarcts in the right frontoparietal region and left frontal lobe. Patchy white matter T2/FLAIR hyperintensities, compatible with chronic microvascular ischemic disease. Cerebral atrophy. Vascular: Major arterial flow voids are maintained. Skull and upper cervical spine: Normal marrow signal. Sinuses/Orbits: Negative. IMPRESSION: 1. No evidence of acute intracranial abnormality. 2. Remote infarcts and chronic microvascular ischemic disease. 3.  Cerebral atrophy (ICD10-G31.9). Electronically Signed   By: Feliberto Harts M.D.   On: 09/13/2023 20:13    Microbiology: Results for orders placed or performed during the hospital encounter of 10/11/23  Urine Culture     Status: Abnormal   Collection Time: 10/11/23  2:39 PM   Specimen: In/Out Cath Urine  Result Value Ref Range Status   Specimen Description   Final    IN/OUT CATH URINE Performed at [facility], [address] [place], [place] [address]    Special Requests   Final    NONE Performed at [facility], [address] [place], [place] [address]    Culture >=100,000 COLONIES/mL KLEBSIELLA PNEUMONIAE (A)   Final   Report Status 10/13/2023 FINAL  Final   Organism ID, Bacteria KLEBSIELLA PNEUMONIAE (A)  Final      Susceptibility   Klebsiella pneumoniae - MIC*    AMPICILLIN >=32 RESISTANT Resistant     CEFAZOLIN <=4 SENSITIVE Sensitive     CEFEPIME <=0.12 SENSITIVE Sensitive     CEFTRIAXONE <=0.25 SENSITIVE Sensitive     CIPROFLOXACIN <=0.25 SENSITIVE Sensitive     GENTAMICIN <=1 SENSITIVE Sensitive     IMIPENEM <=0.25 SENSITIVE Sensitive     NITROFURANTOIN 128 RESISTANT Resistant     TRIMETH/SULFA <=20 SENSITIVE Sensitive     AMPICILLIN/SULBACTAM 8 SENSITIVE Sensitive     PIP/TAZO <=4 SENSITIVE Sensitive ug/mL    * >=100,000 COLONIES/mL KLEBSIELLA PNEUMONIAE    Labs: CBC: Recent Labs  Lab 10/11/23 1202 10/12/23 0445  WBC 13.8* 11.2*  NEUTROABS 11.1*  --   HGB 11.1* 10.3*  HCT 35.0* 30.8*  MCV 92.1 89.3  PLT 322 289   Basic Metabolic Panel: Recent Labs  Lab 10/11/23 1202  NA 141  K 3.9  CL 110  CO2 22  GLUCOSE 91  BUN 17  CREATININE 0.97  CALCIUM 9.9   Liver Function Tests: Recent Labs  Lab 10/11/23 1202  AST 19  ALT 12  ALKPHOS 104  BILITOT 0.9  PROT 7.8  ALBUMIN 3.8    Discharge time spent: greater than 30 minutes.  Signed: Enedina Finner, MD [facility] 10/13/2023

## 2023-10-14 DIAGNOSIS — I480 Paroxysmal atrial fibrillation: Secondary | ICD-10-CM | POA: Diagnosis not present

## 2023-10-14 DIAGNOSIS — I10 Essential (primary) hypertension: Secondary | ICD-10-CM | POA: Diagnosis not present

## 2023-10-14 DIAGNOSIS — G319 Degenerative disease of nervous system, unspecified: Secondary | ICD-10-CM | POA: Diagnosis not present

## 2023-10-14 DIAGNOSIS — N390 Urinary tract infection, site not specified: Secondary | ICD-10-CM | POA: Diagnosis not present

## 2023-10-14 DIAGNOSIS — K922 Gastrointestinal hemorrhage, unspecified: Secondary | ICD-10-CM | POA: Diagnosis not present

## 2023-10-14 DIAGNOSIS — B961 Klebsiella pneumoniae [K. pneumoniae] as the cause of diseases classified elsewhere: Secondary | ICD-10-CM | POA: Diagnosis not present

## 2023-10-14 DIAGNOSIS — F0280 Dementia in other diseases classified elsewhere without behavioral disturbance: Secondary | ICD-10-CM | POA: Diagnosis not present

## 2023-10-14 DIAGNOSIS — I69391 Dysphagia following cerebral infarction: Secondary | ICD-10-CM | POA: Diagnosis not present

## 2023-10-14 DIAGNOSIS — J449 Chronic obstructive pulmonary disease, unspecified: Secondary | ICD-10-CM | POA: Diagnosis not present

## 2023-10-20 ENCOUNTER — Ambulatory Visit: Payer: Medicare HMO | Admitting: Physician Assistant

## 2023-10-20 DIAGNOSIS — N390 Urinary tract infection, site not specified: Secondary | ICD-10-CM

## 2023-10-20 DIAGNOSIS — Z09 Encounter for follow-up examination after completed treatment for conditions other than malignant neoplasm: Secondary | ICD-10-CM

## 2023-10-20 DIAGNOSIS — Z87440 Personal history of urinary (tract) infections: Secondary | ICD-10-CM | POA: Diagnosis not present

## 2023-10-20 DIAGNOSIS — G319 Degenerative disease of nervous system, unspecified: Secondary | ICD-10-CM | POA: Diagnosis not present

## 2023-10-20 DIAGNOSIS — B961 Klebsiella pneumoniae [K. pneumoniae] as the cause of diseases classified elsewhere: Secondary | ICD-10-CM | POA: Diagnosis not present

## 2023-10-20 DIAGNOSIS — K922 Gastrointestinal hemorrhage, unspecified: Secondary | ICD-10-CM | POA: Diagnosis not present

## 2023-10-20 DIAGNOSIS — I480 Paroxysmal atrial fibrillation: Secondary | ICD-10-CM | POA: Diagnosis not present

## 2023-10-20 DIAGNOSIS — I69391 Dysphagia following cerebral infarction: Secondary | ICD-10-CM | POA: Diagnosis not present

## 2023-10-20 DIAGNOSIS — I10 Essential (primary) hypertension: Secondary | ICD-10-CM | POA: Diagnosis not present

## 2023-10-20 DIAGNOSIS — F0280 Dementia in other diseases classified elsewhere without behavioral disturbance: Secondary | ICD-10-CM | POA: Diagnosis not present

## 2023-10-20 DIAGNOSIS — J449 Chronic obstructive pulmonary disease, unspecified: Secondary | ICD-10-CM | POA: Diagnosis not present

## 2023-10-20 LAB — BLADDER SCAN AMB NON-IMAGING: Scan Result: 112

## 2023-10-20 NOTE — Progress Notes (Signed)
10/20/2023 10:16 AM   Richard Carter 1941-01-15 [id number]  CC: Chief Complaint  Patient presents with   Hospitalization Follow-up   HPI: Richard Carter is a 82 y.o. male with PMH dementia and chronic bacteriuria of unclear significance who presents today for outpatient follow-up.  He is accompanied today by his sister and caregiver, who contribute to HPI.  He was admitted at [facility] from 10/11/2023 to 10/13/2023 with generalized weakness and falls suspected due to UTI.  Urine culture grew ampicillin and nitrofurantoin resistant Klebsiella pneumoniae and he is treated with Zosyn and transition to Keflex x 7 days.  Today they report he continues to have malodorous and cloudy urine.  Patient reports intermittent dysuria.  His caregiver has noticed straining to void that has not changed since being started on Flomax.  They do not think that he is having any orthostasis.  They deny gross hematuria or fevers.  They are concerned today about his frequency of possible UTIs, stating that lately they seem to be happening on a monthly basis.  His typical symptoms include confusion, dysuria, cloudy urine, and malodorous urine.  Bladder scan on arrival 112 mL.  He subsequently voided into his diaper and was unable to provide a urine specimen for testing today.  PMH: Past Medical History:  Diagnosis Date   Alcohol abuse    Dementia (HCC)    GERD (gastroesophageal reflux disease)    HLD (hyperlipidemia)    HTN (hypertension)    Seizures (HCC)    Stroke [facility])     Surgical History: Past Surgical History:  Procedure Laterality Date   ENDARTERECTOMY Left 04/02/2023   Procedure: ENDARTERECTOMY CAROTID;  Surgeon: Renford Dills, MD;  Location: [facility] ORS;  Service: Vascular;  Laterality: Left;   skin cyst removal      Home Medications:  Allergies as of 10/20/2023   No Known Allergies      Medication List        Accurate as of October 20, 2023 10:16 AM. If you have any questions,  ask your nurse or doctor.          acetaminophen 500 MG tablet Commonly known as: TYLENOL Take 1,000 mg by mouth 2 (two) times daily as needed for moderate pain, fever or headache.   albuterol (2.5 MG/3ML) 0.083% nebulizer solution Commonly known as: PROVENTIL Take 3 mLs (2.5 mg total) by nebulization every 6 (six) hours as needed for wheezing or shortness of breath.   amLODipine 5 MG tablet Commonly known as: NORVASC Take 1 tablet (5 mg total) by mouth daily.   apixaban 2.5 MG Tabs tablet Commonly known as: ELIQUIS Take 1 tablet (2.5 mg total) by mouth 2 (two) times daily.   atorvastatin 20 MG tablet Commonly known as: LIPITOR Take 20 mg by mouth every evening.   bisacodyl 5 MG EC tablet Commonly known as: DULCOLAX Take 1 tablet (5 mg total) by mouth daily as needed for severe constipation.   Compressor/Nebulizer Misc Use as directed for nebulized medications   ferrous sulfate 325 (65 FE) MG EC tablet Take 1 tablet (325 mg total) by mouth daily with breakfast.   food thickener Liqd Commonly known as: SIMPLYTHICK (HONEY/LEVEL 3/MODERATELY THICK) Take 1 packet by mouth as needed.   levETIRAcetam 500 MG tablet Commonly known as: KEPPRA Take 1 tablet (500 mg total) by mouth 2 (two) times daily.   ondansetron 4 MG disintegrating tablet Commonly known as: ZOFRAN-ODT Take 4 mg by mouth every 8 (eight) hours as needed for  nausea or vomiting.   pantoprazole 40 MG tablet Commonly known as: Protonix Take 1 tablet (40 mg total) by mouth 2 (two) times daily.   senna-docusate 8.6-50 MG tablet Commonly known as: Senokot-S Take 1 tablet by mouth at bedtime.   tamsulosin 0.4 MG Caps capsule Commonly known as: FLOMAX Take 0.4 mg by mouth daily.   traZODone 100 MG tablet Commonly known as: DESYREL Take 1 tablet (100 mg total) by mouth at bedtime.   vitamin B-12 500 MCG tablet Commonly known as: CYANOCOBALAMIN Take 500 mcg by mouth daily.        Allergies:  No  Known Allergies  Family History: Family History  Problem Relation Age of Onset   Stroke Mother    Cancer Brother     Social History:   reports that he quit smoking about 22 months ago. His smoking use included cigarettes. He started smoking about 69 years ago. He has a 34 pack-year smoking history. He has never used smokeless tobacco. He reports that he does not currently use alcohol. He reports that he does not use drugs.  Physical Exam: There were no vitals taken for this visit.  Constitutional:  Alert, no acute distress, nontoxic appearing HEENT: [place], AT Cardiovascular: No clubbing, cyanosis, or edema Respiratory: Normal respiratory effort, no increased work of breathing Skin: No rashes, bruises or suspicious lesions Neurologic: In wheelchair Psychiatric: Normal mood and affect  Laboratory Data: Results for orders placed or performed in visit on 10/20/23  Bladder Scan (Post Void Residual) in office  Result Value Ref Range   Scan Result 112 ml    Assessment & Plan:   1. Recurrent UTI History today is rather challenging due to dementia.  I agree that he is likely to be chronically colonized and we discussed the importance of treating per UTI symptoms and not based on the appearance or odor of urine alone.  With reports of obstructive voiding symptoms not improved on Flomax, I did recommend a cystoscopy with Dr. Lonna Cobb for further evaluation.  If he has a rather obstructive prostate, I question if this could be contributing to his intermittent dysuria and adding further confusion to his possible UTI symptoms. - Bladder Scan (Post Void Residual) in office  Return in about 2 weeks (around 11/03/2023) for Cysto with Dr. Lonna Cobb.  Carman Ching, PA-C  [facility] [place] [address], [address] [place], [place] [address] [phone]

## 2023-10-20 NOTE — Patient Instructions (Signed)

## 2023-10-22 DIAGNOSIS — B961 Klebsiella pneumoniae [K. pneumoniae] as the cause of diseases classified elsewhere: Secondary | ICD-10-CM | POA: Diagnosis not present

## 2023-10-22 DIAGNOSIS — I10 Essential (primary) hypertension: Secondary | ICD-10-CM | POA: Diagnosis not present

## 2023-10-22 DIAGNOSIS — I69391 Dysphagia following cerebral infarction: Secondary | ICD-10-CM | POA: Diagnosis not present

## 2023-10-22 DIAGNOSIS — N390 Urinary tract infection, site not specified: Secondary | ICD-10-CM | POA: Diagnosis not present

## 2023-10-22 DIAGNOSIS — F0280 Dementia in other diseases classified elsewhere without behavioral disturbance: Secondary | ICD-10-CM | POA: Diagnosis not present

## 2023-10-22 DIAGNOSIS — G319 Degenerative disease of nervous system, unspecified: Secondary | ICD-10-CM | POA: Diagnosis not present

## 2023-10-22 DIAGNOSIS — I480 Paroxysmal atrial fibrillation: Secondary | ICD-10-CM | POA: Diagnosis not present

## 2023-10-22 DIAGNOSIS — J449 Chronic obstructive pulmonary disease, unspecified: Secondary | ICD-10-CM | POA: Diagnosis not present

## 2023-10-22 DIAGNOSIS — R001 Bradycardia, unspecified: Secondary | ICD-10-CM | POA: Diagnosis not present

## 2023-10-22 DIAGNOSIS — K922 Gastrointestinal hemorrhage, unspecified: Secondary | ICD-10-CM | POA: Diagnosis not present

## 2023-10-22 IMAGING — CT CT HEAD CODE STROKE
4 series · 16 of 47 positions shown, 18 images · non-contrast
Comparison: CT head October 23, 2021.

CLINICAL DATA: Code stroke.  Neuro deficit, acute, stroke suspected



[Series 3: head wo · axial · 0.49mm/px · z∈[-53,+72]mm · 7 of 35 slices shown, 9 images]
[im 5/35  brain]
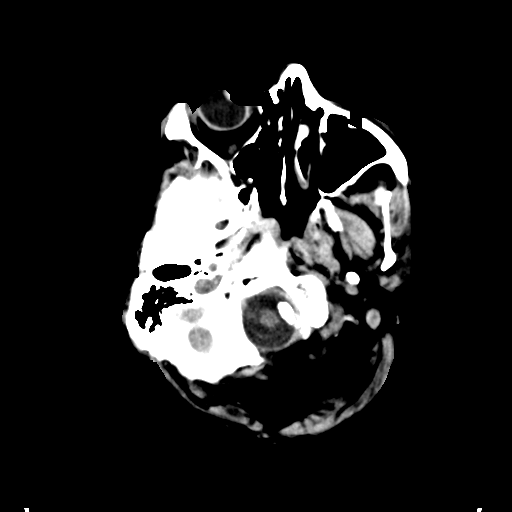
[im 5/35  bone]
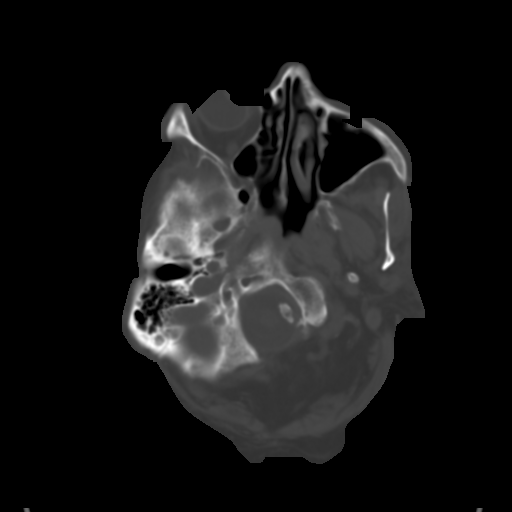
[im 9/35  brain]
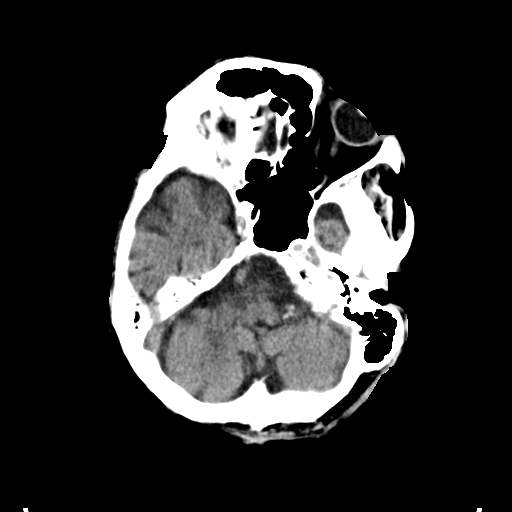
[im 13/35  brain]
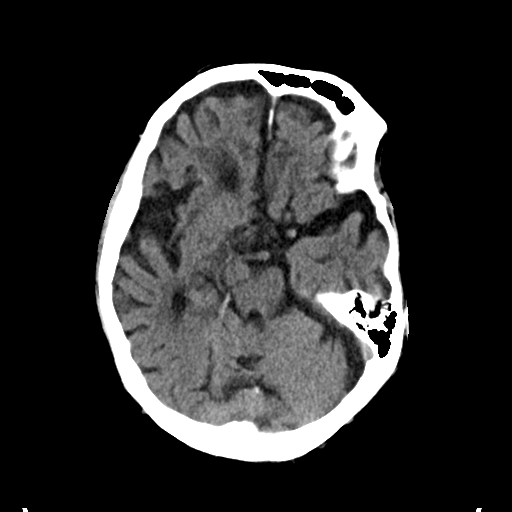
[im 18/35  brain]
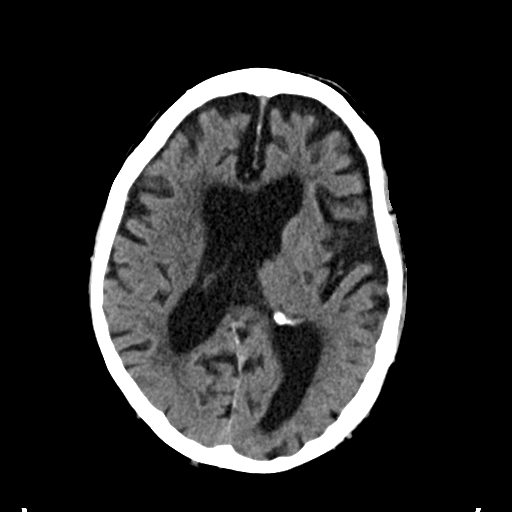
[im 22/35  brain]
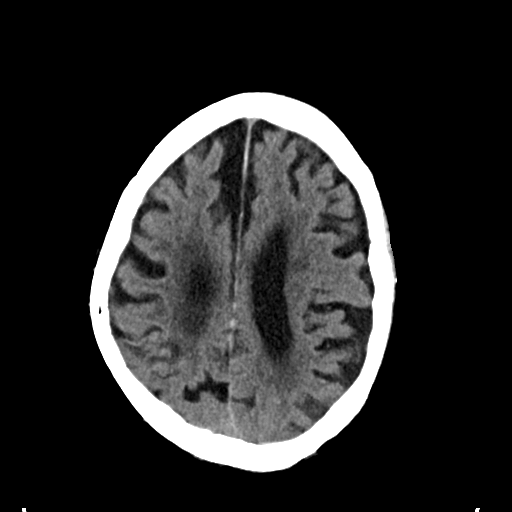
[im 22/35  bone]
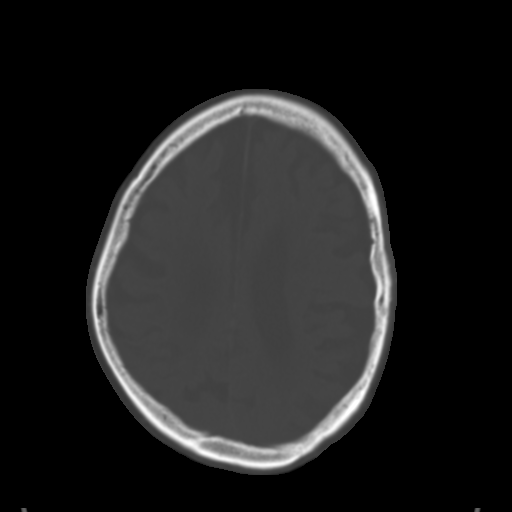
[im 26/35  brain]
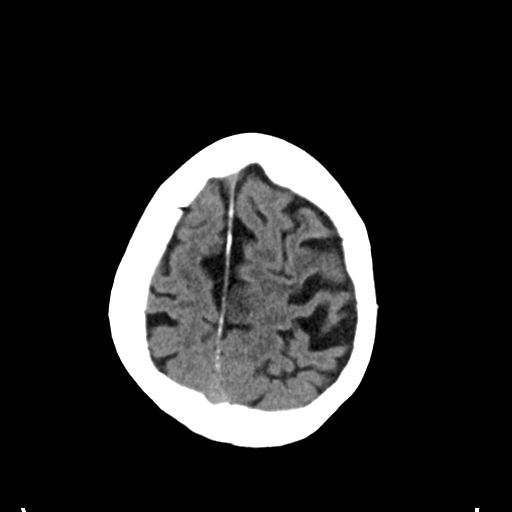
[im 30/35  brain]
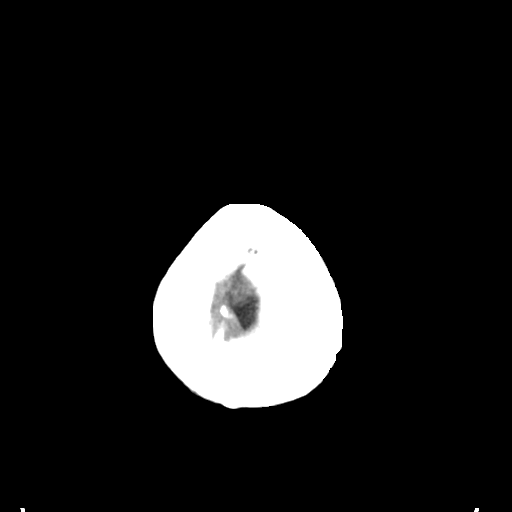

[Series 4: head bone · axial · 0.49mm/px · z∈[-57,-23]mm · 3 of 86 slices shown]
[im 9/86  bone]
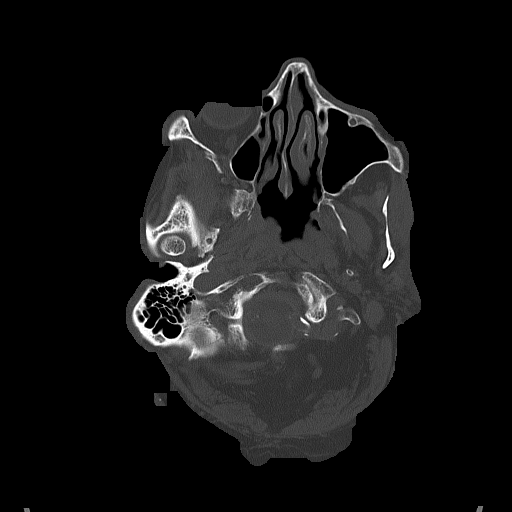
[im 18/86  bone]
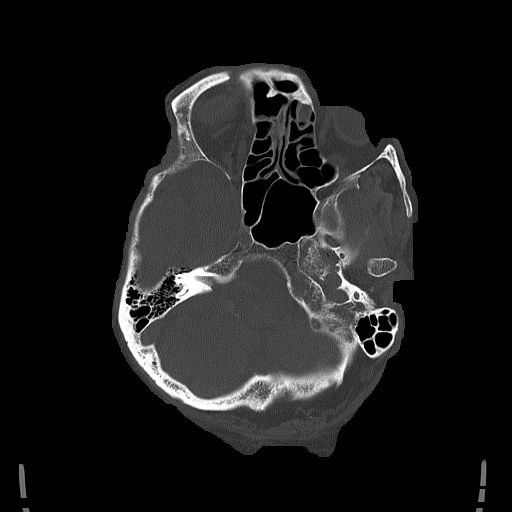
[im 26/86  bone]
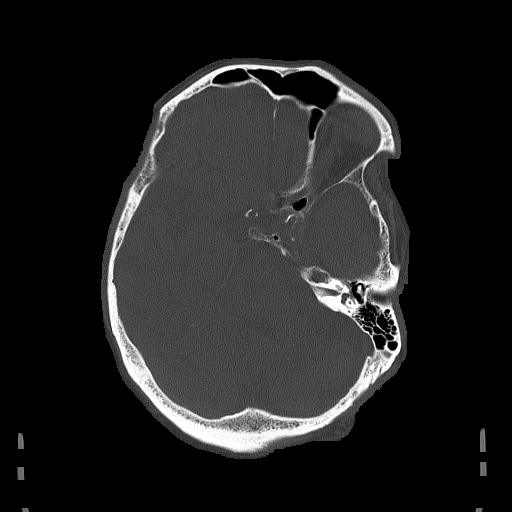

[Series 5: coronal soft tissue · coronal · 0.35mm/px · 3 of 73 slices shown]
[im 25/73  brain]
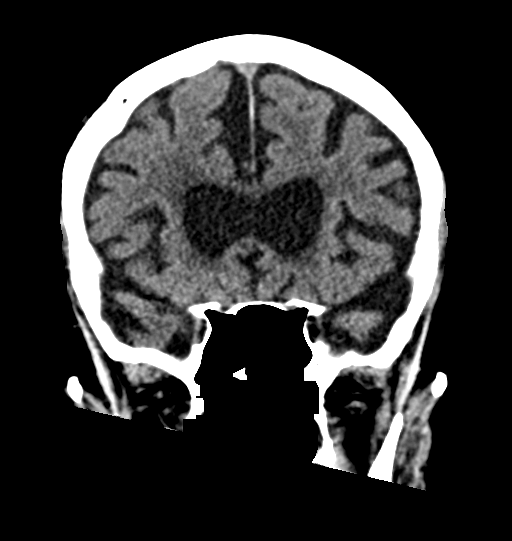
[im 33/73  brain]
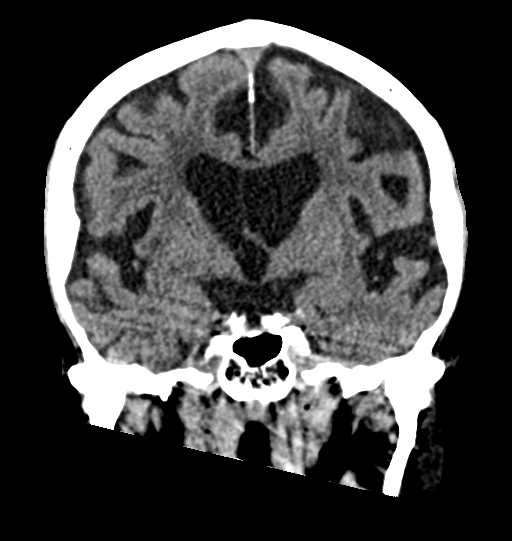
[im 41/73  brain]
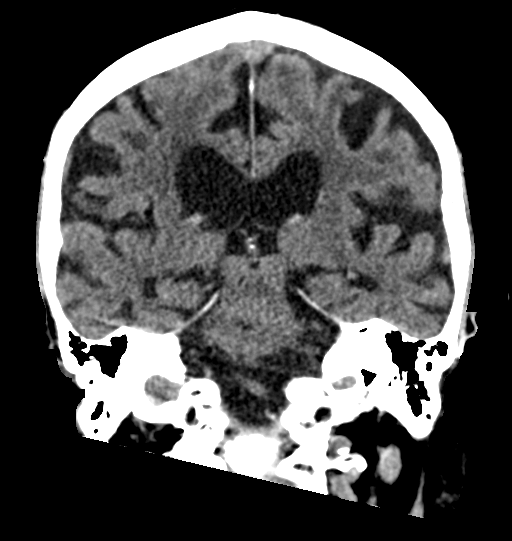

[Series 6: sagittal soft tissue · sagittal · 0.38mm/px · 3 of 60 slices shown]
[im 25/60  brain]
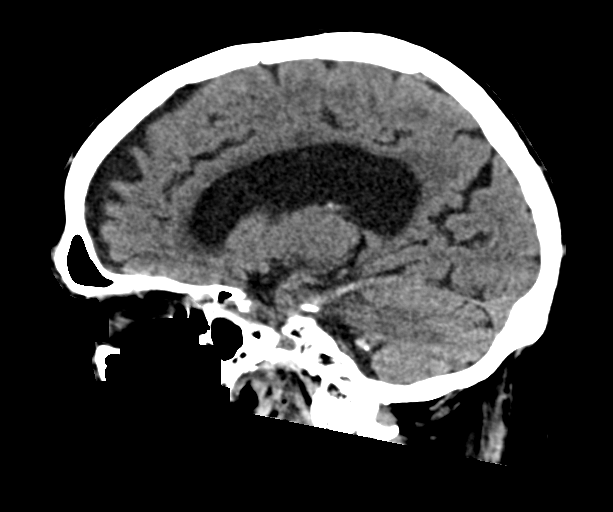
[im 30/60  brain]
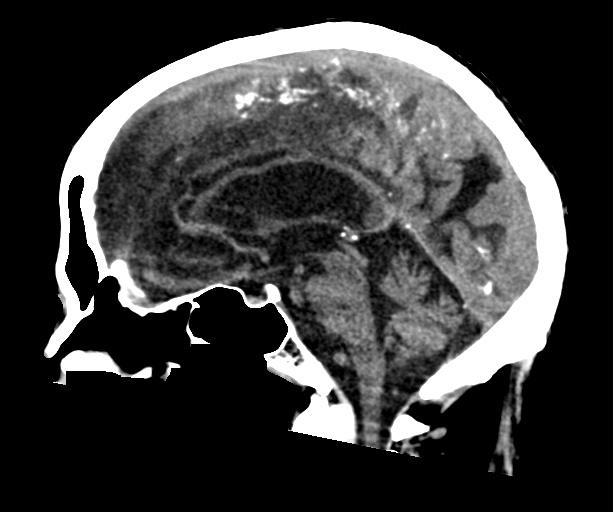
[im 35/60  brain]
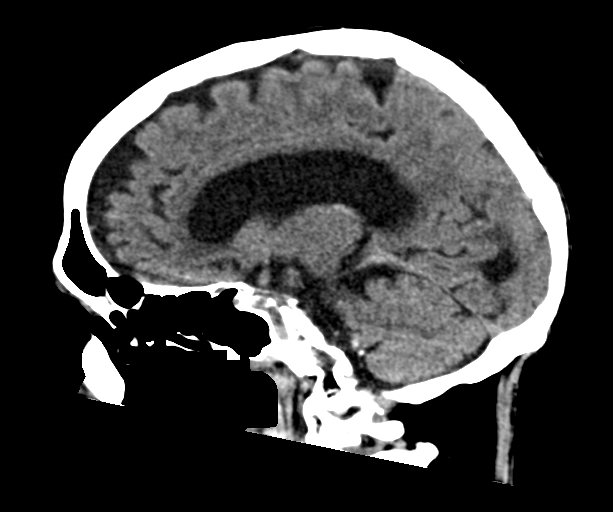

[16 of 47 positions shown; findings below may reference images not displayed]

FINDINGS: Brain: New/interval area of hypodensity within the right parietal
lobe, suggestive of infarct. Otherwise, similar remote infarcts in
the corona radiata and chronic microvascular ischemic disease. No
evidence of acute hemorrhage, mass lesion, midline shift,
hydrocephalus, or extra-axial fluid collection.

Vascular: No hyperdense vessel identified.

Skull: No acute fracture.

Sinuses/Orbits: Clear sinuses.  No acute orbital findings.

Other: No mastoid effusions.
IMPRESSION: 1. New/interval area of hypodensity within the right parietal lobe,
suggestive of infarct that is otherwise age indeterminate by CT.
Consider MRI for more sensitive evaluation.
2. No acute hemorrhage
3. Remote corona radiata infarcts and chronic microvascular ischemic
disease

Code stroke imaging results were communicated on 04/20/2022 at [DATE] to provider Stack via secure text paging.

## 2023-10-27 DIAGNOSIS — I480 Paroxysmal atrial fibrillation: Secondary | ICD-10-CM | POA: Diagnosis not present

## 2023-10-27 DIAGNOSIS — J449 Chronic obstructive pulmonary disease, unspecified: Secondary | ICD-10-CM | POA: Diagnosis not present

## 2023-10-27 DIAGNOSIS — F0280 Dementia in other diseases classified elsewhere without behavioral disturbance: Secondary | ICD-10-CM | POA: Diagnosis not present

## 2023-10-27 DIAGNOSIS — N390 Urinary tract infection, site not specified: Secondary | ICD-10-CM | POA: Diagnosis not present

## 2023-10-27 DIAGNOSIS — B961 Klebsiella pneumoniae [K. pneumoniae] as the cause of diseases classified elsewhere: Secondary | ICD-10-CM | POA: Diagnosis not present

## 2023-10-27 DIAGNOSIS — G319 Degenerative disease of nervous system, unspecified: Secondary | ICD-10-CM | POA: Diagnosis not present

## 2023-10-27 DIAGNOSIS — I69391 Dysphagia following cerebral infarction: Secondary | ICD-10-CM | POA: Diagnosis not present

## 2023-10-27 DIAGNOSIS — I10 Essential (primary) hypertension: Secondary | ICD-10-CM | POA: Diagnosis not present

## 2023-10-27 DIAGNOSIS — K922 Gastrointestinal hemorrhage, unspecified: Secondary | ICD-10-CM | POA: Diagnosis not present

## 2023-10-28 DIAGNOSIS — I69391 Dysphagia following cerebral infarction: Secondary | ICD-10-CM | POA: Diagnosis not present

## 2023-10-28 DIAGNOSIS — I10 Essential (primary) hypertension: Secondary | ICD-10-CM | POA: Diagnosis not present

## 2023-10-28 DIAGNOSIS — N390 Urinary tract infection, site not specified: Secondary | ICD-10-CM | POA: Diagnosis not present

## 2023-10-28 DIAGNOSIS — B961 Klebsiella pneumoniae [K. pneumoniae] as the cause of diseases classified elsewhere: Secondary | ICD-10-CM | POA: Diagnosis not present

## 2023-10-28 DIAGNOSIS — G319 Degenerative disease of nervous system, unspecified: Secondary | ICD-10-CM | POA: Diagnosis not present

## 2023-10-28 DIAGNOSIS — J449 Chronic obstructive pulmonary disease, unspecified: Secondary | ICD-10-CM | POA: Diagnosis not present

## 2023-10-28 DIAGNOSIS — F0280 Dementia in other diseases classified elsewhere without behavioral disturbance: Secondary | ICD-10-CM | POA: Diagnosis not present

## 2023-10-28 DIAGNOSIS — K922 Gastrointestinal hemorrhage, unspecified: Secondary | ICD-10-CM | POA: Diagnosis not present

## 2023-10-28 DIAGNOSIS — I480 Paroxysmal atrial fibrillation: Secondary | ICD-10-CM | POA: Diagnosis not present

## 2023-10-29 DIAGNOSIS — F5104 Psychophysiologic insomnia: Secondary | ICD-10-CM | POA: Diagnosis not present

## 2023-10-29 DIAGNOSIS — F0150 Vascular dementia without behavioral disturbance: Secondary | ICD-10-CM | POA: Diagnosis not present

## 2023-10-29 DIAGNOSIS — D638 Anemia in other chronic diseases classified elsewhere: Secondary | ICD-10-CM | POA: Diagnosis not present

## 2023-10-29 DIAGNOSIS — F0280 Dementia in other diseases classified elsewhere without behavioral disturbance: Secondary | ICD-10-CM | POA: Diagnosis not present

## 2023-10-29 DIAGNOSIS — G40909 Epilepsy, unspecified, not intractable, without status epilepticus: Secondary | ICD-10-CM | POA: Diagnosis not present

## 2023-10-29 DIAGNOSIS — K219 Gastro-esophageal reflux disease without esophagitis: Secondary | ICD-10-CM | POA: Diagnosis not present

## 2023-10-29 DIAGNOSIS — E7800 Pure hypercholesterolemia, unspecified: Secondary | ICD-10-CM | POA: Diagnosis not present

## 2023-10-29 DIAGNOSIS — G309 Alzheimer's disease, unspecified: Secondary | ICD-10-CM | POA: Diagnosis not present

## 2023-10-29 DIAGNOSIS — R001 Bradycardia, unspecified: Secondary | ICD-10-CM | POA: Diagnosis not present

## 2023-10-29 DIAGNOSIS — I480 Paroxysmal atrial fibrillation: Secondary | ICD-10-CM | POA: Diagnosis not present

## 2023-10-30 DIAGNOSIS — I69391 Dysphagia following cerebral infarction: Secondary | ICD-10-CM | POA: Diagnosis not present

## 2023-10-30 DIAGNOSIS — F0280 Dementia in other diseases classified elsewhere without behavioral disturbance: Secondary | ICD-10-CM | POA: Diagnosis not present

## 2023-10-30 DIAGNOSIS — G319 Degenerative disease of nervous system, unspecified: Secondary | ICD-10-CM | POA: Diagnosis not present

## 2023-10-30 DIAGNOSIS — I10 Essential (primary) hypertension: Secondary | ICD-10-CM | POA: Diagnosis not present

## 2023-10-30 DIAGNOSIS — K922 Gastrointestinal hemorrhage, unspecified: Secondary | ICD-10-CM | POA: Diagnosis not present

## 2023-10-30 DIAGNOSIS — B961 Klebsiella pneumoniae [K. pneumoniae] as the cause of diseases classified elsewhere: Secondary | ICD-10-CM | POA: Diagnosis not present

## 2023-10-30 DIAGNOSIS — J449 Chronic obstructive pulmonary disease, unspecified: Secondary | ICD-10-CM | POA: Diagnosis not present

## 2023-10-30 DIAGNOSIS — I480 Paroxysmal atrial fibrillation: Secondary | ICD-10-CM | POA: Diagnosis not present

## 2023-10-30 DIAGNOSIS — N390 Urinary tract infection, site not specified: Secondary | ICD-10-CM | POA: Diagnosis not present

## 2023-11-03 DIAGNOSIS — I480 Paroxysmal atrial fibrillation: Secondary | ICD-10-CM | POA: Diagnosis not present

## 2023-11-03 DIAGNOSIS — K922 Gastrointestinal hemorrhage, unspecified: Secondary | ICD-10-CM | POA: Diagnosis not present

## 2023-11-03 DIAGNOSIS — I10 Essential (primary) hypertension: Secondary | ICD-10-CM | POA: Diagnosis not present

## 2023-11-03 DIAGNOSIS — F0280 Dementia in other diseases classified elsewhere without behavioral disturbance: Secondary | ICD-10-CM | POA: Diagnosis not present

## 2023-11-03 DIAGNOSIS — G319 Degenerative disease of nervous system, unspecified: Secondary | ICD-10-CM | POA: Diagnosis not present

## 2023-11-03 DIAGNOSIS — J449 Chronic obstructive pulmonary disease, unspecified: Secondary | ICD-10-CM | POA: Diagnosis not present

## 2023-11-03 DIAGNOSIS — I69391 Dysphagia following cerebral infarction: Secondary | ICD-10-CM | POA: Diagnosis not present

## 2023-11-03 DIAGNOSIS — B961 Klebsiella pneumoniae [K. pneumoniae] as the cause of diseases classified elsewhere: Secondary | ICD-10-CM | POA: Diagnosis not present

## 2023-11-03 DIAGNOSIS — N390 Urinary tract infection, site not specified: Secondary | ICD-10-CM | POA: Diagnosis not present

## 2023-11-04 DIAGNOSIS — N390 Urinary tract infection, site not specified: Secondary | ICD-10-CM | POA: Diagnosis not present

## 2023-11-04 DIAGNOSIS — I10 Essential (primary) hypertension: Secondary | ICD-10-CM | POA: Diagnosis not present

## 2023-11-04 DIAGNOSIS — K922 Gastrointestinal hemorrhage, unspecified: Secondary | ICD-10-CM | POA: Diagnosis not present

## 2023-11-04 DIAGNOSIS — I69391 Dysphagia following cerebral infarction: Secondary | ICD-10-CM | POA: Diagnosis not present

## 2023-11-04 DIAGNOSIS — B961 Klebsiella pneumoniae [K. pneumoniae] as the cause of diseases classified elsewhere: Secondary | ICD-10-CM | POA: Diagnosis not present

## 2023-11-04 DIAGNOSIS — J449 Chronic obstructive pulmonary disease, unspecified: Secondary | ICD-10-CM | POA: Diagnosis not present

## 2023-11-04 DIAGNOSIS — I480 Paroxysmal atrial fibrillation: Secondary | ICD-10-CM | POA: Diagnosis not present

## 2023-11-10 ENCOUNTER — Telehealth (INDEPENDENT_AMBULATORY_CARE_PROVIDER_SITE_OTHER): Payer: Self-pay | Admitting: Vascular Surgery

## 2023-11-10 DIAGNOSIS — F0280 Dementia in other diseases classified elsewhere without behavioral disturbance: Secondary | ICD-10-CM | POA: Diagnosis not present

## 2023-11-10 DIAGNOSIS — I69391 Dysphagia following cerebral infarction: Secondary | ICD-10-CM | POA: Diagnosis not present

## 2023-11-10 DIAGNOSIS — N390 Urinary tract infection, site not specified: Secondary | ICD-10-CM | POA: Diagnosis not present

## 2023-11-10 DIAGNOSIS — I10 Essential (primary) hypertension: Secondary | ICD-10-CM | POA: Diagnosis not present

## 2023-11-10 DIAGNOSIS — I480 Paroxysmal atrial fibrillation: Secondary | ICD-10-CM | POA: Diagnosis not present

## 2023-11-10 DIAGNOSIS — K922 Gastrointestinal hemorrhage, unspecified: Secondary | ICD-10-CM | POA: Diagnosis not present

## 2023-11-10 DIAGNOSIS — J449 Chronic obstructive pulmonary disease, unspecified: Secondary | ICD-10-CM | POA: Diagnosis not present

## 2023-11-10 DIAGNOSIS — B961 Klebsiella pneumoniae [K. pneumoniae] as the cause of diseases classified elsewhere: Secondary | ICD-10-CM | POA: Diagnosis not present

## 2023-11-10 DIAGNOSIS — G319 Degenerative disease of nervous system, unspecified: Secondary | ICD-10-CM | POA: Diagnosis not present

## 2023-11-10 NOTE — Telephone Encounter (Signed)
Patient is currently in hospice, unable to walk. They want to know if they should still bring in patient for his 6 month fu with GS & Carotid US appointment on 12.19.24. Please advise

## 2023-11-10 NOTE — Telephone Encounter (Signed)
Per new call from Renaldo Reel- states per Jonnie Finner [facility] Nurse) currently with patient, advised that [facility] appointment be cancelled. Cancelled all fu appointments

## 2023-11-13 ENCOUNTER — Ambulatory Visit (INDEPENDENT_AMBULATORY_CARE_PROVIDER_SITE_OTHER): Payer: Medicare HMO | Admitting: Vascular Surgery

## 2023-11-13 ENCOUNTER — Encounter (INDEPENDENT_AMBULATORY_CARE_PROVIDER_SITE_OTHER): Payer: Medicare HMO

## 2023-11-24 ENCOUNTER — Other Ambulatory Visit: Payer: Medicare HMO | Admitting: Urology

## 2023-12-15 ENCOUNTER — Emergency Department
Admission: EM | Admit: 2023-12-15 | Discharge: 2023-12-15 | Disposition: A | Discharge status: Home / Self Care | Attending: Emergency Medicine | Admitting: Emergency Medicine

## 2023-12-15 ENCOUNTER — Other Ambulatory Visit: Payer: Self-pay

## 2023-12-15 ENCOUNTER — Encounter: Payer: Self-pay | Admitting: Emergency Medicine

## 2023-12-15 DIAGNOSIS — N390 Urinary tract infection, site not specified: Secondary | ICD-10-CM | POA: Diagnosis not present

## 2023-12-15 MED ORDER — CEFUROXIME AXETIL 250 MG PO TABS
250.0000 mg | ORAL_TABLET | Freq: Once | ORAL | Status: AC
  Administered 2023-12-15: 250 mg via ORAL
  Filled 2023-12-15: qty 1

## 2023-12-15 MED ORDER — CEFPODOXIME PROXETIL 200 MG PO TABS: 200.0000 mg | ORAL_TABLET | Freq: Two times a day (BID) | ORAL | 0 refills | Status: AC

## 2023-12-15 NOTE — ED Triage Notes (Signed)
Pt in via POV, neighbor is here w/ patient who states patient is under Hospice Care with recent urinalysis that was concerning for UTI.  States symptoms just started today; w/ dysuria and strong odor to  urine.  Patient A/Ox3, NAD noted at this time.

## 2023-12-15 NOTE — ED Provider Notes (Signed)
[facility] Provider Note   Event Date/Time   First MD Initiated Contact with Patient 12/15/23 2201     (approximate) History  Urinary Tract Infection  HPI Richard Carter is a 83 y.o. male hospice patient who presents after a urine dip today was positive for UTI.  Patient's caregiver at bedside states that he was told to come to the emergency department to receive "strong antibiotics". ROS: Patient currently denies any vision changes, tinnitus, difficulty speaking, facial droop, sore throat, chest pain, shortness of breath, abdominal pain, nausea/vomiting/diarrhea, dysuria, or weakness/numbness/paresthesias in any extremity   Physical Exam  Triage Vital Signs: ED Triage Vitals  Encounter Vitals Group     BP 12/15/23 1619 115/89     Systolic BP Percentile --      Diastolic BP Percentile --      Pulse Rate 12/15/23 1619 75     Resp 12/15/23 1619 18     Temp 12/15/23 1619 (!) 94.9 F (34.9 C)     Temp Source 12/15/23 1619 Axillary     SpO2 12/15/23 1619 98 %     Weight 12/15/23 1622 120 lb (54.4 kg)     Height --      Head Circumference --      Peak Flow --      Pain Score 12/15/23 1622 0     Pain Loc --      Pain Education --      Exclude from Growth Chart --    Most recent vital signs: Vitals:   12/15/23 1619  BP: 115/89  Pulse: 75  Resp: 18  Temp: (!) 94.9 F (34.9 C)  SpO2: 98%   General: Awake, cooperative. CV:  Good peripheral perfusion.  Resp:  Normal effort.  Abd:  No distention.  Other:  Elderly cachectic Caucasian male resting comfortably in no acute distress ED Results / Procedures / Treatments  Labs (all labs ordered are listed, but only abnormal results are displayed) Labs Reviewed - No data to display  PROCEDURES: Critical Care performed: No Procedures MEDICATIONS ORDERED IN ED: Medications  cefUROXime (CEFTIN) tablet 250 mg (has no administration in time range)   IMPRESSION / MDM / ASSESSMENT AND PLAN / ED COURSE  I  reviewed the triage vital signs and the nursing notes.                             The patient is on the cardiac monitor to evaluate for evidence of arrhythmia and/or significant heart rate changes. Patient's presentation is most consistent with acute presentation with potential threat to life or bodily function. No e/o epididymo-orchitis on exam and low suspicion for rectal abscess, prostatitis, other GU deep space infection, gonorrhea/chlamydia. Unlikely Infected Urolithiasis, AAA, cholecystitis, pancreatitis, SBO, appendicitis, or other acute abdomen. Workup: UA: None Rx: Cefpodoxime 200 mg twice daily x 10 days  Disposition: Discharge home. SRP discussed. Advise follow up with primary care provider within 24-72 hours.   FINAL CLINICAL IMPRESSION(S) / ED DIAGNOSES   Final diagnoses:  Lower urinary tract infectious disease   Rx / DC Orders   ED Discharge Orders          Ordered    cefpodoxime (VANTIN) 200 MG tablet  2 times daily        12/15/23 2248           Note:  This document was prepared using Dragon voice recognition software and may include  unintentional dictation errors.   Merwyn Katos, MD 12/15/23 2250

## 2023-12-15 NOTE — Progress Notes (Signed)
[facility] Liaison Note  [name] is a current hospice patient followed for terminal diagnosis of Cerebral Vascular Disease.  Hospice Case Manager obtained urine on the patient today but family decided to take patient to the ED to be seen as he has been treated orally for UTI and felt patient was getting worse.  Please call with any hospice related questions or concerns.  Norris Cross, RN Nurse Liaison [phone]

## 2024-02-24 DEATH — deceased
